# Patient Record
Sex: Male | Born: 1970 | Race: White | Hispanic: No | Marital: Single | Smoking: Current every day smoker
Health system: Southern US, Community
[De-identification: ages and names within clinical notes are randomized; demographics above are authoritative.]

## PROBLEM LIST (undated history)

## (undated) DIAGNOSIS — F1910 Other psychoactive substance abuse, uncomplicated: Secondary | ICD-10-CM

## (undated) HISTORY — PX: OTHER SURGICAL HISTORY: SHX169

## (undated) HISTORY — PX: KNEE ARTHROSCOPY: SUR90

---

## 1997-09-02 ENCOUNTER — Emergency Department (HOSPITAL_COMMUNITY): Admission: EM | Admit: 1997-09-02 | Discharge: 1997-09-02 | Payer: Self-pay | Admitting: Emergency Medicine

## 2014-06-21 ENCOUNTER — Encounter (HOSPITAL_COMMUNITY): Payer: Self-pay | Admitting: *Deleted

## 2014-06-21 ENCOUNTER — Emergency Department (HOSPITAL_COMMUNITY)
Admission: EM | Admit: 2014-06-21 | Discharge: 2014-06-22 | Disposition: A | Discharge status: Home / Self Care | Payer: Medicaid Other | Attending: Emergency Medicine | Admitting: Emergency Medicine

## 2014-06-21 DIAGNOSIS — Y92410 Unspecified street and highway as the place of occurrence of the external cause: Secondary | ICD-10-CM | POA: Insufficient documentation

## 2014-06-21 DIAGNOSIS — Y998 Other external cause status: Secondary | ICD-10-CM | POA: Insufficient documentation

## 2014-06-21 DIAGNOSIS — Y9389 Activity, other specified: Secondary | ICD-10-CM | POA: Insufficient documentation

## 2014-06-21 DIAGNOSIS — T148 Other injury of unspecified body region: Secondary | ICD-10-CM | POA: Insufficient documentation

## 2014-06-21 DIAGNOSIS — Z720 Tobacco use: Secondary | ICD-10-CM | POA: Insufficient documentation

## 2014-06-21 DIAGNOSIS — T148XXA Other injury of unspecified body region, initial encounter: Secondary | ICD-10-CM

## 2014-06-21 DIAGNOSIS — S3992XA Unspecified injury of lower back, initial encounter: Secondary | ICD-10-CM | POA: Diagnosis present
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
## 2014-06-22 MED ORDER — CYCLOBENZAPRINE HCL 10 MG PO TABS: 10.0000 mg | ORAL_TABLET | Freq: Two times a day (BID) | ORAL | Status: DC | PRN

## 2014-06-22 MED ORDER — IBUPROFEN 800 MG PO TABS: 800.0000 mg | ORAL_TABLET | Freq: Three times a day (TID) | ORAL | Status: DC
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
## 2014-09-08 ENCOUNTER — Emergency Department (HOSPITAL_COMMUNITY)
Admission: EM | Admit: 2014-09-08 | Discharge: 2014-09-08 | Disposition: A | Discharge status: Home / Self Care | Payer: Medicaid Other | Attending: Emergency Medicine | Admitting: Emergency Medicine

## 2014-09-08 ENCOUNTER — Encounter (HOSPITAL_COMMUNITY): Payer: Self-pay | Admitting: *Deleted

## 2014-09-08 DIAGNOSIS — L02414 Cutaneous abscess of left upper limb: Secondary | ICD-10-CM | POA: Diagnosis not present

## 2014-09-08 DIAGNOSIS — L0291 Cutaneous abscess, unspecified: Secondary | ICD-10-CM

## 2014-09-08 DIAGNOSIS — L02413 Cutaneous abscess of right upper limb: Secondary | ICD-10-CM | POA: Diagnosis present

## 2014-09-08 DIAGNOSIS — Z791 Long term (current) use of non-steroidal anti-inflammatories (NSAID): Secondary | ICD-10-CM | POA: Insufficient documentation

## 2014-09-08 DIAGNOSIS — Z720 Tobacco use: Secondary | ICD-10-CM | POA: Insufficient documentation

## 2014-09-08 MED ORDER — CLINDAMYCIN HCL 300 MG PO CAPS: 300.0000 mg | ORAL_CAPSULE | Freq: Four times a day (QID) | ORAL | Status: DC

## 2014-09-08 MED ORDER — LIDOCAINE HCL 2 % IJ SOLN
10.0000 mL | Freq: Once | INTRAMUSCULAR | Status: AC
  Administered 2014-09-08: 200 mg
  Filled 2014-09-08: qty 20

## 2014-09-08 NOTE — ED Provider Notes (Signed)
CSN: [id number]     Arrival date & time 09/08/14  1318 History   This chart was scribed for non-physician practitioner Teressa Lower, PA-C working with Rolland Porter, MD by Murriel Hopper, ED Scribe. This patient was seen in room WTR7/WTR7 and the patient's care was started at 1:35 PM.  Chief Complaint  Patient presents with  . Abscess      The history is provided by the patient. No language interpreter was used.     HPI Comments: Johnathan [name] is a 44 y.o. male with a hx of IV drug use who presents to the Emergency Department complaining of multiple abscesses on the lower part of his right upper arm and on his left wrist that have been present for 2 days. Pt states he has consistently used IV drugs before, and states he has been using methadone given to him by a rehabilitation clinic recently, which has caused his abscesses. Pt denies drainage from the abscesses, fever, vomiting, or chills.      History reviewed. No pertinent past medical history. History reviewed. No pertinent past surgical history. No family history on file. Social History  Substance Use Topics  . Smoking status: Current Every Day Smoker -- 0.50 packs/day    Types: Cigarettes  . Smokeless tobacco: None  . Alcohol Use: No    Review of Systems  Constitutional: Negative for fever and chills.  Gastrointestinal: Negative for vomiting.  Skin: Positive for color change.  All other systems reviewed and are negative.     Allergies  Review of patient's allergies indicates no known allergies.  Home Medications   Prior to Admission medications   Medication Sig Start Date End Date Taking? Authorizing Provider  cyclobenzaprine (FLEXERIL) 10 MG tablet Take 1 tablet (10 mg total) by mouth 2 (two) times daily as needed for muscle spasms. 06/22/14   Elpidio Anis, PA-C  ibuprofen (ADVIL,MOTRIN) 800 MG tablet Take 1 tablet (800 mg total) by mouth 3 (three) times daily. 06/22/14   Shari Upstill, PA-C   BP 104/65 mmHg   Pulse 89  Temp(Src) 99.6 F (37.6 C) (Oral)  Resp 18  SpO2 97% Physical Exam  Constitutional: He is oriented to person, place, and time.  Cardiovascular: Normal rate and regular rhythm.   Pulmonary/Chest: Effort normal and breath sounds normal.  Neurological: He is alert and oriented to person, place, and time.  Skin:  Swelling and redness to the right ac. Tender to palpation to the left forearm without fluctuance or redness  Nursing note and vitals reviewed.   ED Course  INCISION AND DRAINAGE Date/Time: 09/08/2014 1:58 PM Performed by: Teressa Lower Authorized by: Teressa Lower Consent: Verbal consent obtained. Consent given by: patient Patient identity confirmed: verbally with patient Time out: Immediately prior to procedure a "time out" was called to verify the correct patient, procedure, equipment, support staff and site/side marked as required. Type: abscess Body area: upper extremity Location details: right elbow Anesthesia: local infiltration Local anesthetic: lidocaine 2% without epinephrine Scalpel size: 11 Incision type: single straight Complexity: simple Drainage: purulent Drainage amount: scant Wound treatment: wound left open Packing material: 1/2 in iodoform gauze   (including critical care time)  DIAGNOSTIC STUDIES: Oxygen Saturation is 97% on room air, normal by my interpretation.    COORDINATION OF CARE: 1:40 PM Discussed treatment plan with pt at bedside and pt agreed to plan.   Labs Review Labs Reviewed - No data to display  Imaging Review No results found. I have personally reviewed and evaluated these  images and lab results as part of my medical decision-making.   EKG Interpretation None      MDM   Final diagnoses:  Abscess   Pt given clindamycin. Dr. Fayrene Fearing I&D the abscess on the forearm  I personally performed the services described in this documentation, which was scribed in my presence. The recorded information has been  reviewed and is accurate.    Teressa Lower, NP 09/08/14 1424  Rolland Porter, MD 09/11/14 [phone]

## 2014-09-08 NOTE — ED Notes (Signed)
Pt complains of abscesses to his left forearm and right upper arm for the past 2 days. Pt denies drainage to abscesses.

## 2014-09-08 NOTE — Discharge Instructions (Signed)
Have the parking removed in the last couple of days Abscess An abscess is an infected area that contains a collection of pus and debris.It can occur in almost any part of the body. An abscess is also known as a furuncle or boil. CAUSES  An abscess occurs when tissue gets infected. This can occur from blockage of oil or sweat glands, infection of hair follicles, or a minor injury to the skin. As the body tries to fight the infection, pus collects in the area and creates pressure under the skin. This pressure causes pain. People with weakened immune systems have difficulty fighting infections and get certain abscesses more often.  SYMPTOMS Usually an abscess develops on the skin and becomes a painful mass that is red, warm, and tender. If the abscess forms under the skin, you may feel a moveable soft area under the skin. Some abscesses break open (rupture) on their own, but most will continue to get worse without care. The infection can spread deeper into the body and eventually into the bloodstream, causing you to feel ill.  DIAGNOSIS  Your caregiver will take your medical history and perform a physical exam. A sample of fluid may also be taken from the abscess to determine what is causing your infection. TREATMENT  Your caregiver may prescribe antibiotic medicines to fight the infection. However, taking antibiotics alone usually does not cure an abscess. Your caregiver may need to make a small cut (incision) in the abscess to drain the pus. In some cases, gauze is packed into the abscess to reduce pain and to continue draining the area. HOME CARE INSTRUCTIONS   Only take over-the-counter or prescription medicines for pain, discomfort, or fever as directed by your caregiver.  If you were prescribed antibiotics, take them as directed. Finish them even if you start to feel better.  If gauze is used, follow your caregiver's directions for changing the gauze.  To avoid spreading the infection:  Keep  your draining abscess covered with a bandage.  Wash your hands well.  Do not share personal care items, towels, or whirlpools with others.  Avoid skin contact with others.  Keep your skin and clothes clean around the abscess.  Keep all follow-up appointments as directed by your caregiver. SEEK MEDICAL CARE IF:   You have increased pain, swelling, redness, fluid drainage, or bleeding.  You have muscle aches, chills, or a general ill feeling.  You have a fever. MAKE SURE YOU:   Understand these instructions.  Will watch your condition.  Will get help right away if you are not doing well or get worse. Document Released: 10/03/2004 Document Revised: 06/25/2011 Document Reviewed: 03/08/2011 [facility] Patient Information 2015 [place], [place]. This information is not intended to replace advice given to you by your health care provider. Make sure you discuss any questions you have with your health care provider.

## 2014-09-12 ENCOUNTER — Inpatient Hospital Stay (HOSPITAL_COMMUNITY)
Admission: EM | Admit: 2014-09-12 | Discharge: 2014-09-16 | DRG: 603 | Disposition: A | Discharge status: Home / Self Care | Payer: Medicaid Other | Attending: Internal Medicine | Admitting: Internal Medicine

## 2014-09-12 ENCOUNTER — Inpatient Hospital Stay (HOSPITAL_COMMUNITY): Payer: Medicaid Other

## 2014-09-12 ENCOUNTER — Encounter (HOSPITAL_COMMUNITY): Payer: Self-pay | Admitting: *Deleted

## 2014-09-12 DIAGNOSIS — L03114 Cellulitis of left upper limb: Secondary | ICD-10-CM | POA: Diagnosis present

## 2014-09-12 DIAGNOSIS — B182 Chronic viral hepatitis C: Secondary | ICD-10-CM | POA: Diagnosis present

## 2014-09-12 DIAGNOSIS — L0291 Cutaneous abscess, unspecified: Secondary | ICD-10-CM

## 2014-09-12 DIAGNOSIS — F17210 Nicotine dependence, cigarettes, uncomplicated: Secondary | ICD-10-CM | POA: Diagnosis present

## 2014-09-12 DIAGNOSIS — R197 Diarrhea, unspecified: Secondary | ICD-10-CM | POA: Diagnosis present

## 2014-09-12 DIAGNOSIS — R61 Generalized hyperhidrosis: Secondary | ICD-10-CM | POA: Diagnosis present

## 2014-09-12 DIAGNOSIS — F1910 Other psychoactive substance abuse, uncomplicated: Secondary | ICD-10-CM | POA: Diagnosis present

## 2014-09-12 DIAGNOSIS — Z2252 Carrier of viral hepatitis C: Secondary | ICD-10-CM | POA: Diagnosis not present

## 2014-09-12 DIAGNOSIS — L02414 Cutaneous abscess of left upper limb: Secondary | ICD-10-CM | POA: Diagnosis present

## 2014-09-12 DIAGNOSIS — L02413 Cutaneous abscess of right upper limb: Secondary | ICD-10-CM | POA: Diagnosis present

## 2014-09-12 DIAGNOSIS — B1920 Unspecified viral hepatitis C without hepatic coma: Secondary | ICD-10-CM | POA: Diagnosis present

## 2014-09-12 DIAGNOSIS — Z720 Tobacco use: Secondary | ICD-10-CM | POA: Diagnosis not present

## 2014-09-12 DIAGNOSIS — E872 Acidosis: Secondary | ICD-10-CM | POA: Diagnosis present

## 2014-09-12 DIAGNOSIS — L03113 Cellulitis of right upper limb: Secondary | ICD-10-CM | POA: Diagnosis present

## 2014-09-12 DIAGNOSIS — B9689 Other specified bacterial agents as the cause of diseases classified elsewhere: Secondary | ICD-10-CM | POA: Diagnosis not present

## 2014-09-12 DIAGNOSIS — K089 Disorder of teeth and supporting structures, unspecified: Secondary | ICD-10-CM | POA: Diagnosis present

## 2014-09-12 DIAGNOSIS — R894 Abnormal immunological findings in specimens from other organs, systems and tissues: Secondary | ICD-10-CM | POA: Diagnosis not present

## 2014-09-12 HISTORY — DX: Other psychoactive substance abuse, uncomplicated: F19.10

## 2014-09-12 LAB — CBC
HCT: 41.8 % (ref 39.0–52.0)
HEMOGLOBIN: 14.1 g/dL (ref 13.0–17.0)
MCH: 29.3 pg (ref 26.0–34.0)
MCHC: 33.7 g/dL (ref 30.0–36.0)
MCV: 86.9 fL (ref 78.0–100.0)
Platelets: 351 10*3/uL (ref 150–400)
RBC: 4.81 MIL/uL (ref 4.22–5.81)
RDW: 14 % (ref 11.5–15.5)
WBC: 11.5 10*3/uL — ABNORMAL HIGH (ref 4.0–10.5)

## 2014-09-12 LAB — BASIC METABOLIC PANEL
Anion gap: 7 (ref 5–15)
BUN: 5 mg/dL — AB (ref 6–20)
CO2: 30 mmol/L (ref 22–32)
CREATININE: 0.74 mg/dL (ref 0.61–1.24)
Calcium: 8.8 mg/dL — ABNORMAL LOW (ref 8.9–10.3)
Chloride: 99 mmol/L — ABNORMAL LOW (ref 101–111)
GFR calc Af Amer: >60 mL/min (ref >60)
Glucose, Bld: 99 mg/dL (ref 65–99)
Potassium: 3.7 mmol/L (ref 3.5–5.1)
SODIUM: 136 mmol/L (ref 135–145)

## 2014-09-12 LAB — I-STAT CG4 LACTIC ACID, ED: LACTIC ACID, VENOUS: 3.03 mmol/L — AB (ref 0.5–2.0)

## 2014-09-12 LAB — LACTIC ACID, PLASMA: LACTIC ACID, VENOUS: 1 mmol/L (ref 0.5–2.0)

## 2014-09-12 MED ORDER — MORPHINE SULFATE (PF) 2 MG/ML IV SOLN
1.0000 mg | INTRAVENOUS | Status: DC | PRN
  Administered 2014-09-12 – 2014-09-16 (×23): 1 mg via INTRAVENOUS
  Filled 2014-09-12 (×22): qty 1

## 2014-09-12 MED ORDER — VANCOMYCIN HCL IN DEXTROSE 1-5 GM/200ML-% IV SOLN
1000.0000 mg | Freq: Once | INTRAVENOUS | Status: AC
  Administered 2014-09-12: 1000 mg via INTRAVENOUS
  Filled 2014-09-12: qty 200

## 2014-09-12 MED ORDER — VANCOMYCIN HCL IN DEXTROSE 1-5 GM/200ML-% IV SOLN
1000.0000 mg | Freq: Three times a day (TID) | INTRAVENOUS | Status: DC
  Administered 2014-09-12 – 2014-09-15 (×8): 1000 mg via INTRAVENOUS
  Filled 2014-09-12 (×10): qty 200

## 2014-09-12 MED ORDER — ACETAMINOPHEN 650 MG RE SUPP: 650.0000 mg | Freq: Four times a day (QID) | RECTAL | Status: DC | PRN

## 2014-09-12 MED ORDER — OXYCODONE HCL 5 MG PO TABS
5.0000 mg | ORAL_TABLET | ORAL | Status: DC | PRN
  Filled 2014-09-12: qty 1

## 2014-09-12 MED ORDER — SODIUM CHLORIDE 0.9 % IV BOLUS (SEPSIS)
500.0000 mL | INTRAVENOUS | Status: AC
  Administered 2014-09-12: 500 mL via INTRAVENOUS

## 2014-09-12 MED ORDER — METHADONE HCL 10 MG PO TABS
70.0000 mg | ORAL_TABLET | Freq: Every day | ORAL | Status: DC
  Administered 2014-09-13 – 2014-09-16 (×4): 70 mg via ORAL
  Filled 2014-09-12 (×4): qty 7

## 2014-09-12 MED ORDER — ACETAMINOPHEN 325 MG PO TABS
650.0000 mg | ORAL_TABLET | Freq: Four times a day (QID) | ORAL | Status: DC | PRN
  Administered 2014-09-14: 650 mg via ORAL
  Filled 2014-09-12: qty 2

## 2014-09-12 MED ORDER — ENOXAPARIN SODIUM 40 MG/0.4ML ~~LOC~~ SOLN
40.0000 mg | SUBCUTANEOUS | Status: DC
  Administered 2014-09-12 – 2014-09-15 (×4): 40 mg via SUBCUTANEOUS
  Filled 2014-09-12 (×4): qty 0.4

## 2014-09-12 MED ORDER — SODIUM CHLORIDE 0.9 % IV SOLN
INTRAVENOUS | Status: DC
  Administered 2014-09-13 – 2014-09-14 (×2): via INTRAVENOUS

## 2014-09-12 MED ORDER — SODIUM CHLORIDE 0.9 % IV SOLN
Freq: Once | INTRAVENOUS | Status: AC
  Administered 2014-09-12: 18:00:00 via INTRAVENOUS

## 2014-09-12 MED ORDER — SODIUM CHLORIDE 0.9 % IV BOLUS (SEPSIS)
1000.0000 mL | INTRAVENOUS | Status: AC
  Administered 2014-09-12: 1000 mL via INTRAVENOUS

## 2014-09-12 NOTE — Progress Notes (Signed)
Patient listed as having Medicaid [employer] insurance.  PCP listed on patient's insurance card is located at [facility].

## 2014-09-12 NOTE — ED Notes (Addendum)
Pt seen in ED on 9/1 for 2 abscess, 1 in R AC area, 1 to left forearm. Both were size of quarter and lanced, given prescription for clindamycin, has been complaint with medications. right AC abscess has improved. Left forearm abscess redness/swelling now has spread to all of forearm. Pain 6/10. Both abscess has been draining. Has been taking aleve for pain.

## 2014-09-12 NOTE — H&P (Signed)
[facility] History and Physical  Cy Bresee OZH:[id number] DOB: 1970/09/20 DOA: 09/12/2014  Referring physician: EDP PCP: No primary care provider on file.   Chief Complaint: worsening cellulitis of the left forearm.   HPI: Johnathan [name] is a 44 y.o. male with prior h/o of IV drug abuse, last use two weeks ago presented 5 days ago with cellulitis of the right arm and cellulitis and abscess of the left forearm, under went I&D of the left forearm and was discharged on oral clindamycin. He returns with worsening pain , erythema and swelling and failed outpatient treatment with clindamycin. He reports fevers and night sweats and loose bm since then.  He was referred to medical service for further evaluation.    Review of Systems:  Constitutional:  fevers HEENT:  No headaches, Difficulty swallowing,Tooth/dental problems,Sore throat,  No sneezing, itching, ear ache, nasal congestion, post nasal drip,  Cardio-vascular:  No chest pain, Orthopnea, PND, swelling in lower extremities, anasarca, dizziness, palpitations  GI:  No heartburn, indigestion, abdominal pain, nausea, vomiting, diarrhea, change in bowel habits, loss of appetite  Resp:  No shortness of breath with exertion or at rest. No excess mucus, no productive cough, No non-productive cough, No coughing up of blood.No change in color of mucus.No wheezing.No chest wall deformity  Skin:  no rash or lesions.  GU:  no dysuria, change in color of urine, no urgency or frequency. No flank pain.  Musculoskeletal:  Has been using IV drugs , last use 2 weeks ago, and since then cellulitis and abscess of the right forearm and left forearm.  Psych:  No change in mood or affect. No depression or anxiety. No memory loss.   History reviewed. No pertinent past medical history. History reviewed. No pertinent past surgical history. Social History:  reports that he has been smoking Cigarettes.  He has been smoking about 0.50  packs per day. He does not have any smokeless tobacco history on file. He reports that he does not drink alcohol or use illicit drugs.  No Known Allergies  History reviewed. No pertinent family history.  Denies any family history.   Prior to Admission medications   Medication Sig Start Date End Date Taking? Authorizing Provider  clindamycin (CLEOCIN) 300 MG capsule Take 1 capsule (300 mg total) by mouth every 6 (six) hours. 09/08/14  Yes Teressa Lower, NP  naproxen sodium (ANAPROX) 220 MG tablet Take 440 mg by mouth every 12 (twelve) hours as needed (pain).   Yes Historical Provider, MD  cyclobenzaprine (FLEXERIL) 10 MG tablet Take 1 tablet (10 mg total) by mouth 2 (two) times daily as needed for muscle spasms. Patient not taking: Reported on 09/12/2014 06/22/14   Elpidio Anis, PA-C  ibuprofen (ADVIL,MOTRIN) 800 MG tablet Take 1 tablet (800 mg total) by mouth 3 (three) times daily. Patient not taking: Reported on 09/12/2014 06/22/14   Elpidio Anis, PA-C   Physical Exam: Filed Vitals:   09/12/14 1745 09/12/14 1800 09/12/14 1815 09/12/14 1850  BP: 118/70 110/73 103/68 123/73  Pulse: 69 79 72 68  Temp:    98.7 F (37.1 C)  TempSrc:    Oral  Resp: 11 13 10 16   Height:    5\' 11"  (1.803 m)  Weight:    75.297 kg (166 lb)  SpO2: 95% 97% 100% 100%    Wt Readings from Last 3 Encounters:  09/12/14 75.297 kg (166 lb)    General:  Appears calm and comfortable Eyes: PERRL, normal lids, irises & conjunctiva Neck: left axillary  adenopathy, no masses or thyromegaly Cardiovascular: RRR, no m/r/g. No LE edema. Telemetry: SR, no arrhythmias  Respiratory: CTA bilaterally, no w/r/r. Normal respiratory effort. Abdomen: soft, ntnd Skin: no rash or induration seen on limited exam Musculoskeletal: LEFT forearm, tender, swollen and erythematous.  Psychiatric: grossly normal mood and affect, speech fluent and appropriate Neurologic: grossly non-focal.          Labs on Admission:  Basic Metabolic  Panel:  Recent Labs Lab 09/12/14 1405  NA 136  K 3.7  CL 99*  CO2 30  GLUCOSE 99  BUN 5*  CREATININE 0.74  CALCIUM 8.8*   Liver Function Tests: No results for input(s): AST, ALT, ALKPHOS, BILITOT, PROT, ALBUMIN in the last 168 hours. No results for input(s): LIPASE, AMYLASE in the last 168 hours. No results for input(s): AMMONIA in the last 168 hours. CBC:  Recent Labs Lab 09/12/14 1405  WBC 11.5*  HGB 14.1  HCT 41.8  MCV 86.9  PLT 351   Cardiac Enzymes: No results for input(s): CKTOTAL, CKMB, CKMBINDEX, TROPONINI in the last 168 hours.  BNP (last 3 results) No results for input(s): BNP in the last 8760 hours.  ProBNP (last 3 results) No results for input(s): PROBNP in the last 8760 hours.  CBG: No results for input(s): GLUCAP in the last 168 hours.  Radiological Exams on Admission: No results found.  EKG: pending.   Assessment/Plan Active Problems:   Cellulitis   Worsening cellulitis: failed outpatient treatment with oral clindamycin.  - started on IV vancomycin.  - unfortunately no cultures available from the I&D done on 1st of sep.  - get ct of the forearm.  - pain control and hydration.    Elevated lactic acid: Possibly from the above.  Repeat lactic acid in 4 hours .    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  Resume the same from tomorrow.  Check hep panel and HIV.  He is agreeable.    Diarrhea since two days: check for C DIFF.     Code Status: full code  DVT Prophylaxis: Family Communication: none at bedside Disposition Plan: admit to med surg.   Time spent: 55 minutes.   [facility] Pager [phone]

## 2014-09-12 NOTE — ED Provider Notes (Signed)
CSN: [id number]     Arrival date & time 09/12/14  1336 History   First MD Initiated Contact with Patient 09/12/14 1625     Chief Complaint  Patient presents with  . Arm Infection      (Consider location/radiation/quality/duration/timing/severity/associated sxs/prior Treatment) The history is provided by the patient.    Past Medical History  Diagnosis Date  . Substance abuse    Past Surgical History  Procedure Laterality Date  . Incision and drainage of bilateral forearm abscesses Bilateral    History reviewed. No pertinent family history. Social History  Substance Use Topics  . Smoking status: Current Every Day Smoker -- 0.50 packs/day for 15 years    Types: Cigarettes  . Smokeless tobacco: None  . Alcohol Use: No     Comment: seldom once a year     Review of Systems  Constitutional: Positive for chills. Negative for fever.  Respiratory: Negative for shortness of breath.   Cardiovascular: Negative for chest pain, palpitations and leg swelling.  Gastrointestinal: Negative for nausea and vomiting.  Musculoskeletal: Negative for joint swelling and neck pain.  Skin: Positive for wound.  Neurological: Negative for weakness and numbness.  All other systems reviewed and are negative.     Allergies  Review of patient's allergies indicates no known allergies.  Home Medications   Prior to Admission medications   Medication Sig Start Date End Date Taking? Authorizing Provider  clindamycin (CLEOCIN) 300 MG capsule Take 1 capsule (300 mg total) by mouth every 6 (six) hours. 09/08/14  Yes Teressa Lower, NP  methadone (DOLOPHINE) 10 MG tablet Take 70 mg by mouth daily. Receives from [facility] in [place]   Yes Historical Provider, MD  naproxen sodium (ANAPROX) 220 MG tablet Take 440 mg by mouth every 12 (twelve) hours as needed (pain).   Yes Historical Provider, MD  cyclobenzaprine (FLEXERIL) 10 MG tablet Take 1 tablet (10 mg total) by mouth 2 (two) times  daily as needed for muscle spasms. Patient not taking: Reported on 09/12/2014 06/22/14   Elpidio Anis, PA-C  ibuprofen (ADVIL,MOTRIN) 800 MG tablet Take 1 tablet (800 mg total) by mouth 3 (three) times daily. Patient not taking: Reported on 09/12/2014 06/22/14   Elpidio Anis, PA-C   BP 113/78 mmHg  Pulse 56  Temp(Src) 98.2 F (36.8 C) (Oral)  Resp 16  Ht  (1.803 m)  Wt 166 lb (75.297 kg)  BMI 23.16 kg/m2  SpO2 96% Physical Exam  Constitutional: He is oriented to person, place, and time. He appears well-developed and well-nourished.  HENT:  Head: Normocephalic.  Eyes: Pupils are equal, round, and reactive to light.  Neck: Normal range of motion.  Cardiovascular: Normal rate and regular rhythm.   Pulmonary/Chest: Effort normal and breath sounds normal.  Abdominal: Soft. Bowel sounds are normal.  Musculoskeletal: He exhibits edema and tenderness.  Lymphadenopathy:    He has axillary adenopathy.       Left axillary: Pectoral adenopathy present.  Neurological: He is alert and oriented to person, place, and time.  Skin: There is erythema.  Area has been outlined   Nursing note and vitals reviewed.   ED Course  Procedures (including critical care time) Labs Review Labs Reviewed  CBC - Abnormal; Notable for the following:    WBC 11.5 (*)    All other components within normal limits  BASIC METABOLIC PANEL - Abnormal; Notable for the following:    Chloride 99 (*)    BUN 5 (*)    Calcium  8.8 (*)    All other components within normal limits  HEPATITIS PANEL, ACUTE - Abnormal; Notable for the following:    HCV Ab >11.0 (*)    All other components within normal limits  CBC - Abnormal; Notable for the following:    Hemoglobin 12.5 (*)    HCT 38.5 (*)    All other components within normal limits  VANCOMYCIN, TROUGH - Abnormal; Notable for the following:    Vancomycin Tr 25 (*)    All other components within normal limits  I-STAT CG4 LACTIC ACID, ED - Abnormal; Notable for the  following:    Lactic Acid, Venous 3.03 (*)    All other components within normal limits  CULTURE, BLOOD (ROUTINE X 2)  CULTURE, BLOOD (ROUTINE X 2)  LACTIC ACID, PLASMA  HIV ANTIBODY (ROUTINE TESTING)  CREATININE, SERUM  HEPATITIS C VRS RNA DETECT BY PCR-QUAL  HEPATITIS B SURFACE ANTIGEN  HEPATITIS B SURFACE ANTIBODY, QUANTITATIVE  I-STAT CG4 LACTIC ACID, ED    Imaging Review No results found. I have personally reviewed and evaluated these images and lab results as part of my medical decision-making.   EKG Interpretation None    worsening cellulitis despite PO Clindamycin use  Still taking  A should meet sepsis criteria chills, temperature, elevated white count and elevated lactic acid MDM   Final diagnoses:  Cellulitis of left upper extremity        Earley Favor, NP 09/15/14 1954  Lyndal Pulley, MD 09/15/14 2121

## 2014-09-12 NOTE — ED Notes (Addendum)
Pt comes in today for left arm pain. Pt states he was seen 5 days ago and had the area lanced off. Pt states the pain has worsened and the swelling has significantly increased. Pt noted to have the lower portion of his left arm anterior side swollen and very painful to touch or movement.

## 2014-09-12 NOTE — Progress Notes (Addendum)
ANTIBIOTIC CONSULT NOTE - INITIAL  Pharmacy Consult for vancomycin Indication: cellulitis  No Known Allergies  Patient Measurements: Height:  (180.3 cm) Weight: 166 lb (75.297 kg) IBW/kg (Calculated) : 75.3  Vital Signs: Temp: 98.7 F (37.1 C) (09/05 1850) Temp Source: Oral (09/05 1850) BP: 123/73 mmHg (09/05 1850) Pulse Rate: 68 (09/05 1850) Intake/Output from previous day:   Intake/Output from this shift:    Labs:  Recent Labs  09/12/14 1405  WBC 11.5*  HGB 14.1  PLT 351  CREATININE 0.74   Estimated Creatinine Clearance: 126.8 mL/min (by C-G formula based on Cr of 0.74). No results for input(s): VANCOTROUGH, VANCOPEAK, VANCORANDOM, GENTTROUGH, GENTPEAK, GENTRANDOM, TOBRATROUGH, TOBRAPEAK, TOBRARND, AMIKACINPEAK, AMIKACINTROU, AMIKACIN in the last 72 hours.   Microbiology: No results found for this or any previous visit (from the past 720 hour(s)).  Medical History: History reviewed. No pertinent past medical history.  Medications:  Anti-infectives    Start     Dose/Rate Route Frequency Ordered Stop   09/12/14 1645  vancomycin (VANCOCIN) IVPB 1000 mg/200 mL premix     1,000 mg 200 mL/hr over 60 Minutes Intravenous  Once 09/12/14 1635 09/12/14 1837     Assessment: 43 yoM with Hx IVDU, last use two weeks ago presented 5 days ago with cellulitis of the right arm and cellulitis and abscess of the left forearm, under went I&D of the left forearm and was discharged on oral clindamycin. He returns with worsening pain, erythema and swelling and failed outpatient treatment with clindamycin.  Pharmacy to begin vancomycin for purulent cellulitis.  Note code sepsis called and 1 g vancomycin given in ED.  9/5 >> vancomycin >>  Temp: afebrile WBC: mildly elevated Renal: SCr wnl; CrCl > 90 CG LA elevated  9/5 blood: IP 9/5 Cdiff: IP   Goal of Therapy:  Vancomycin trough level 15-20 mcg/ml Eradication of infection Appropriate antibiotic dosing for indication  and renal function  Plan:  Day 1 antibiotics  Vancomycin 1 g IV q8 hr.   Targeting higher trough d/t worsening cellulitis despite appropriate antibiotics, noted abscess, and lactic acidosis.  May consider reducing target trough once cellulitis appears to be improving.  Measure vancomycin trough levels soon with q8h dosing.  Given location of infection and recent Hx IV drug abuse, would consider expanding coverage to cover gram negatives and anaerobes if patient does not improve overnight.  Follow clinical course, renal function, culture results as available  Follow for de-escalation of antibiotics and LOT   Bernadene Person, PharmD, BCPS Pager: [phone] 09/12/2014, 8:20 PM

## 2014-09-12 NOTE — ED Notes (Signed)
Critical Istat Lactic result given to PA Dondra Spry

## 2014-09-12 NOTE — ED Notes (Signed)
Went to draw pts lab however pt not in bed. RN is aware

## 2014-09-12 NOTE — Progress Notes (Signed)
Utilization Review completed.  Armani Gawlik RN CM  

## 2014-09-13 ENCOUNTER — Encounter (HOSPITAL_COMMUNITY): Payer: Self-pay | Admitting: *Deleted

## 2014-09-13 LAB — HIV ANTIBODY (ROUTINE TESTING W REFLEX): HIV Screen 4th Generation wRfx: NONREACTIVE

## 2014-09-13 MED ORDER — OXYCODONE HCL 5 MG PO TABS
10.0000 mg | ORAL_TABLET | ORAL | Status: DC | PRN
  Administered 2014-09-14 – 2014-09-16 (×8): 10 mg via ORAL
  Filled 2014-09-13 (×8): qty 2

## 2014-09-13 NOTE — Progress Notes (Signed)
Patient said his MOM can get updates about his condition.

## 2014-09-13 NOTE — Plan of Care (Signed)
Problem: Phase I Progression Outcomes Goal: OOB as tolerated unless otherwise ordered Outcome: Completed/Met Date Met:  09/13/14 Up as tol

## 2014-09-13 NOTE — Progress Notes (Signed)
[facility] PROGRESS NOTE  Toddrick Sanna ZOX:[id number] DOB: 09/12/1970 DOA: 09/12/2014 PCP: No primary care provider on file. Interim summary: Johnathan [name] is a 44 y.o. male with prior h/o of IV drug abuse, last use two weeks ago presented 5 days ago with cellulitis of the right arm and cellulitis and abscess of the left forearm, under went I&D of the left forearm and was discharged on oral clindamycin. He returns with worsening pain , erythema and swelling and failed outpatient treatment with clindamycin. Assessment/Plan: Worsening cellulitis: failed outpatient treatment with oral clindamycin.  - started on IV vancomycin.  - unfortunately no cultures available from the I&D done on 1st of sep.  - CT of the forearm does not show any abscess. Blood cultures done and hae been negative so far.  - pain control and hydration.    Elevated lactic acid: Possibly from the above.  Repeat lactic acid in 4 hours  Is normal.    IV drug abuse: on 70 mg of methadone.  Check hep panel and HIV.  He is agreeable.    Diarrhea since two days: resolved.    Code Status: full code.  Family Communication:none at bedside Disposition Plan: pending further management.    Consultants:  none  Procedures:  Ct forearm  Antibiotics:  Vancomycin9/5  HPI/Subjective: Reports pain is persistent.  Objective: Filed Vitals:   09/13/14 1335  BP: 101/68  Pulse: 68  Temp: 97.7 F (36.5 C)  Resp: 16    Intake/Output Summary (Last 24 hours) at 09/13/14 1753 Last data filed at 09/13/14 1230  Gross per 24 hour  Intake   4462 ml  Output      0 ml  Net   4462 ml   Filed Weights   09/12/14 1740 09/12/14 1850  Weight: 75.297 kg (166 lb) 75.297 kg (166 lb)    Exam:   General:  Alert in pain from the arm  Cardiovascular: s1s2, no MRG,   Respiratory: clear no wheezing or rhonchi  Abdomen: soft non tender non distended bowel sounds heard  Musculoskeletal: LEFT  forearm, tender, swollen and erythematous. appear to be slightly worsened from yesterday.   Data Reviewed: Basic Metabolic Panel:  Recent Labs Lab 09/12/14 1405  NA 136  K 3.7  CL 99*  CO2 30  GLUCOSE 99  BUN 5*  CREATININE 0.74  CALCIUM 8.8*   Liver Function Tests: No results for input(s): AST, ALT, ALKPHOS, BILITOT, PROT, ALBUMIN in the last 168 hours. No results for input(s): LIPASE, AMYLASE in the last 168 hours. No results for input(s): AMMONIA in the last 168 hours. CBC:  Recent Labs Lab 09/12/14 1405  WBC 11.5*  HGB 14.1  HCT 41.8  MCV 86.9  PLT 351   Cardiac Enzymes: No results for input(s): CKTOTAL, CKMB, CKMBINDEX, TROPONINI in the last 168 hours. BNP (last 3 results) No results for input(s): BNP in the last 8760 hours.  ProBNP (last 3 results) No results for input(s): PROBNP in the last 8760 hours.  CBG: No results for input(s): GLUCAP in the last 168 hours.  Recent Results (from the past 240 hour(s))  Culture, blood (routine x 2)     Status: None (Preliminary result)   Collection Time: 09/12/14  5:00 PM  Result Value Ref Range Status   Specimen Description BLOOD RIGHT ANTECUBITAL  Final   Special Requests BOTTLES DRAWN AEROBIC AND ANAEROBIC  Final   Culture   Final    NO GROWTH < 24 HOURS Performed at [facility]    Report Status PENDING  Incomplete  Culture, blood (routine x 2)     Status: None (Preliminary result)   Collection Time: 09/12/14  5:33 PM  Result Value Ref Range Status   Specimen Description BLOOD RIGHT ANTECUBITAL  Final   Special Requests BOTTLES DRAWN AEROBIC AND ANAEROBIC 10CC  Final   Culture   Final    NO GROWTH < 24 HOURS Performed at [facility]    Report Status PENDING  Incomplete     Studies: Ct Forearm Left Wo Contrast  09/13/2014   CLINICAL DATA:  LEFT forearm abscess. Lateral wrist abscess from IV drug abuse.  EXAM: CT OF THE LEFT FOREARM WITHOUT CONTRAST  TECHNIQUE: Multidetector CT  imaging was performed according to the standard protocol. Multiplanar CT image reconstructions were also generated.  COMPARISON:  None.  FINDINGS: Evaluation for abscess is degraded by noncontrast technique. There is no soft tissue abscess. Diffuse subcutaneous edema is present in the forearm extending from the elbow to the wrist. This is compatible with cellulitis in the appropriate clinical setting. Radius and ulna appear normal. Distal humerus and visible carpal bones appear normal.  IMPRESSION: No soft tissue abscess. Diffuse subcutaneous edema compatible with cellulitis in the appropriate clinical setting.   Electronically Signed   By: Andreas Newport M.D.   On: 09/13/2014 07:14    Scheduled Meds: . enoxaparin (LOVENOX) injection  40 mg Subcutaneous Q24H  . methadone  70 mg Oral Daily  . vancomycin  1,000 mg Intravenous Q8H   Continuous Infusions: . sodium chloride 75 mL/hr at 09/13/14 1154    Active Problems:   Cellulitis    Time spent: 35 min    Odin Mariani  [facility] Pager [phone] If 7PM-7AM, please contact night-coverage at [email or web address], password [facility] 09/13/2014, 5:53 PM  LOS: 1 day

## 2014-09-14 ENCOUNTER — Encounter (HOSPITAL_COMMUNITY): Payer: Self-pay | Admitting: Internal Medicine

## 2014-09-14 DIAGNOSIS — R509 Fever, unspecified: Secondary | ICD-10-CM

## 2014-09-14 DIAGNOSIS — L02413 Cutaneous abscess of right upper limb: Secondary | ICD-10-CM | POA: Diagnosis present

## 2014-09-14 DIAGNOSIS — Z720 Tobacco use: Secondary | ICD-10-CM

## 2014-09-14 DIAGNOSIS — F1910 Other psychoactive substance abuse, uncomplicated: Secondary | ICD-10-CM | POA: Diagnosis present

## 2014-09-14 DIAGNOSIS — B182 Chronic viral hepatitis C: Secondary | ICD-10-CM | POA: Diagnosis present

## 2014-09-14 DIAGNOSIS — B9689 Other specified bacterial agents as the cause of diseases classified elsewhere: Secondary | ICD-10-CM

## 2014-09-14 DIAGNOSIS — F17210 Nicotine dependence, cigarettes, uncomplicated: Secondary | ICD-10-CM | POA: Diagnosis present

## 2014-09-14 DIAGNOSIS — R894 Abnormal immunological findings in specimens from other organs, systems and tissues: Secondary | ICD-10-CM

## 2014-09-14 DIAGNOSIS — L02414 Cutaneous abscess of left upper limb: Secondary | ICD-10-CM

## 2014-09-14 DIAGNOSIS — R634 Abnormal weight loss: Secondary | ICD-10-CM

## 2014-09-14 DIAGNOSIS — K089 Disorder of teeth and supporting structures, unspecified: Secondary | ICD-10-CM | POA: Diagnosis present

## 2014-09-14 DIAGNOSIS — Z2252 Carrier of viral hepatitis C: Secondary | ICD-10-CM

## 2014-09-14 LAB — CBC
HEMATOCRIT: 38.5 % — AB (ref 39.0–52.0)
Hemoglobin: 12.5 g/dL — ABNORMAL LOW (ref 13.0–17.0)
MCH: 28.2 pg (ref 26.0–34.0)
MCHC: 32.5 g/dL (ref 30.0–36.0)
MCV: 86.9 fL (ref 78.0–100.0)
PLATELETS: 294 10*3/uL (ref 150–400)
RBC: 4.43 MIL/uL (ref 4.22–5.81)
RDW: 13.7 % (ref 11.5–15.5)
WBC: 8.3 10*3/uL (ref 4.0–10.5)

## 2014-09-14 LAB — CREATININE, SERUM: Creatinine, Ser: 0.74 mg/dL (ref 0.61–1.24)

## 2014-09-14 LAB — HEPATITIS PANEL, ACUTE
HCV Ab: >11 s/co ratio — ABNORMAL HIGH (ref 0.0–0.9)
HEP B C IGM: NEGATIVE
Hep A IgM: NEGATIVE
Hepatitis B Surface Ag: NEGATIVE

## 2014-09-14 MED ORDER — SODIUM CHLORIDE 0.9 % IV SOLN
1.5000 g | Freq: Four times a day (QID) | INTRAVENOUS | Status: DC
  Administered 2014-09-14 – 2014-09-16 (×7): 1.5 g via INTRAVENOUS
  Filled 2014-09-14 (×9): qty 1.5

## 2014-09-14 NOTE — Progress Notes (Addendum)
[facility] Progress Note   Johnathan [name]  OZH:[id number]  DOB: 1970/04/27  DOA: 09/12/2014 PCP: Karle Plumber, MD  Brief narrative: Johnathan [name] is a 44 y.o. male with a history of IV drug abuse who presents with increasing cellulitis of the right arm and cellulitis and abscess of left forearm despite being started on clindamycin on 9/1. States that it started off as a small knot in his forearm about 3 weeks ago when he last injected himself with oxycodone but then subsequently spread. He underwent an I and D of the left forearm abscess on 9/1-cultures not sent. After starting clindamycin, he states swelling extended further up the forearm and he therefore presented to the ER once again. Admitted and started on vancomycin. Hep C antibody found to be positive. He has been going to a methadone clinic about 4 weeks now. He last injected IV Roxicodone about 3 weeks ago.   Subjective: Continues to have swelling and pain in left forearm  Assessment/Plan: Principal Problem: Cellulitis of bilateral forearms-abscess of left forearm status post I & D -in relation to IV injection of Roxicodone last injected about 3 weeks ago -no culture sent-no significant improvement with vancomycin-I have consult infectious disease and they have added Unasyn today  Active Problems:   Hepatitis C antibody test positive -Follow up hep C viral load -Hep B serology also sent    Substance abuse -Currently in a methadone clinic for his drug abuse-continue methadone    Cigarette smoker -Advised to discontinue   Code Status:     Code Status Orders        Start     Ordered   09/12/14 1858  Full code   Continuous     09/12/14 1857     Family Communication:  Disposition Plan: Continue to follow for improvement DVT prophylaxis: Lovenox Consultants: Infectious disease Procedures: Appt with PCP: Requested Antibiotics: Anti-infectives    Start     Dose/Rate Route Frequency Ordered  Stop   09/14/14 1700  ampicillin-sulbactam (UNASYN) 1.5 g in sodium chloride 0.9 % 50 mL IVPB     1.5 g 100 mL/hr over 30 Minutes Intravenous Every 6 hours 09/14/14 1611     09/12/14 2200  vancomycin (VANCOCIN) IVPB 1000 mg/200 mL premix     1,000 mg 200 mL/hr over 60 Minutes Intravenous Every 8 hours 09/12/14 2037     09/12/14 1645  vancomycin (VANCOCIN) IVPB 1000 mg/200 mL premix     1,000 mg 200 mL/hr over 60 Minutes Intravenous  Once 09/12/14 1635 09/12/14 1837      Objective: Filed Weights   09/12/14 1740 09/12/14 1850  Weight: 75.297 kg (166 lb) 75.297 kg (166 lb)    Intake/Output Summary (Last 24 hours) at 09/14/14 1635 Last data filed at 09/14/14 1346  Gross per 24 hour  Intake   5420 ml  Output      0 ml  Net   5420 ml     Vitals Filed Vitals:   09/13/14 1335 09/13/14 2054 09/14/14 0553 09/14/14 1336  BP: 101/68 117/85 109/75 107/75  Pulse: 68 64 54 58  Temp: 97.7 F (36.5 C) 97.9 F (36.6 C) 97.6 F (36.4 C) 98.1 F (36.7 C)  TempSrc: Oral Oral Oral Oral  Resp: 16 16 16    Height:      Weight:      SpO2: 100% 99% 97% 98%    Exam:  General:  Pt is alert, not in acute distress  HEENT: No icterus, No thrush, oral  mucosa moist  Cardiovascular: regular rate and rhythm, S1/S2 No murmur  Respiratory: clear to auscultation bilaterally   Abdomen: Soft, +Bowel sounds, non tender, non distended, no guarding  MSK: No LE edema, cyanosis or clubbing- + left dorsal forearm swelling with pitting and tenderness- healed incision on the left forearm from I & D  Data Reviewed: Basic Metabolic Panel:  Recent Labs Lab 09/12/14 1405 09/14/14 0425  NA 136  --   K 3.7  --   CL 99*  --   CO2 30  --   GLUCOSE 99  --   BUN 5*  --   CREATININE 0.74 0.74  CALCIUM 8.8*  --    Liver Function Tests: No results for input(s): AST, ALT, ALKPHOS, BILITOT, PROT, ALBUMIN in the last 168 hours. No results for input(s): LIPASE, AMYLASE in the last 168 hours. No results  for input(s): AMMONIA in the last 168 hours. CBC:  Recent Labs Lab 09/12/14 1405 09/14/14 0425  WBC 11.5* 8.3  HGB 14.1 12.5*  HCT 41.8 38.5*  MCV 86.9 86.9  PLT 351 294   Cardiac Enzymes: No results for input(s): CKTOTAL, CKMB, CKMBINDEX, TROPONINI in the last 168 hours. BNP (last 3 results) No results for input(s): BNP in the last 8760 hours.  ProBNP (last 3 results) No results for input(s): PROBNP in the last 8760 hours.  CBG: No results for input(s): GLUCAP in the last 168 hours.  Recent Results (from the past 240 hour(s))  Culture, blood (routine x 2)     Status: None (Preliminary result)   Collection Time: 09/12/14  5:00 PM  Result Value Ref Range Status   Specimen Description BLOOD RIGHT ANTECUBITAL  Final   Special Requests BOTTLES DRAWN AEROBIC AND ANAEROBIC  Final   Culture   Final    NO GROWTH 2 DAYS Performed at [facility]    Report Status PENDING  Incomplete  Culture, blood (routine x 2)     Status: None (Preliminary result)   Collection Time: 09/12/14  5:33 PM  Result Value Ref Range Status   Specimen Description BLOOD RIGHT ANTECUBITAL  Final   Special Requests BOTTLES DRAWN AEROBIC AND ANAEROBIC 10CC  Final   Culture   Final    NO GROWTH 2 DAYS Performed at [facility]    Report Status PENDING  Incomplete     Studies: Ct Forearm Left Wo Contrast  09/13/2014   CLINICAL DATA:  LEFT forearm abscess. Lateral wrist abscess from IV drug abuse.  EXAM: CT OF THE LEFT FOREARM WITHOUT CONTRAST  TECHNIQUE: Multidetector CT imaging was performed according to the standard protocol. Multiplanar CT image reconstructions were also generated.  COMPARISON:  None.  FINDINGS: Evaluation for abscess is degraded by noncontrast technique. There is no soft tissue abscess. Diffuse subcutaneous edema is present in the forearm extending from the elbow to the wrist. This is compatible with cellulitis in the appropriate clinical setting. Radius and ulna appear  normal. Distal humerus and visible carpal bones appear normal.  IMPRESSION: No soft tissue abscess. Diffuse subcutaneous edema compatible with cellulitis in the appropriate clinical setting.   Electronically Signed   By: Andreas Newport M.D.   On: 09/13/2014 07:14    Scheduled Meds:  Scheduled Meds: . ampicillin-sulbactam (UNASYN) IV  1.5 g Intravenous Q6H  . enoxaparin (LOVENOX) injection  40 mg Subcutaneous Q24H  . methadone  70 mg Oral Daily  . vancomycin  1,000 mg Intravenous Q8H   Continuous Infusions:  Time spent on care of this patient: 35 min   Leonte Horrigan, MD 09/14/2014, 4:35 PM  LOS: 2 days   [facility] Office  [phone] Pager - Text Page per [email or web address] If 7PM-7AM, please contact night-coverage [email or web address]
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
## 2014-09-15 DIAGNOSIS — L03114 Cellulitis of left upper limb: Secondary | ICD-10-CM

## 2014-09-15 LAB — VANCOMYCIN, TROUGH: Vancomycin Tr: 25 ug/mL — ABNORMAL HIGH (ref 10.0–20.0)

## 2014-09-15 MED ORDER — VANCOMYCIN HCL IN DEXTROSE 1-5 GM/200ML-% IV SOLN
1000.0000 mg | Freq: Two times a day (BID) | INTRAVENOUS | Status: DC
  Administered 2014-09-15 – 2014-09-16 (×2): 1000 mg via INTRAVENOUS
  Filled 2014-09-15 (×3): qty 200

## 2014-09-15 NOTE — Progress Notes (Signed)
ANTIBIOTIC CONSULT NOTE - INITIAL  Pharmacy Consult for vancomycin Indication: cellulitis  No Known Allergies  Patient Measurements: Height: 5\' 11"  (180.3 cm) Weight: 166 lb (75.297 kg) IBW/kg (Calculated) : 75.3  Vital Signs: Temp: 97.7 F (36.5 C) (09/08 0544) Temp Source: Oral (09/08 0544) BP: 123/86 mmHg (09/08 0544) Pulse Rate: 58 (09/08 0544) Intake/Output from previous day: 09/07 0701 - 09/08 0700 In: 3415 [P.O.:2550; I.V.:315; IV Piggyback:550] Out: 1025 [Urine:1025] Intake/Output from this shift:    Labs:  Recent Labs  09/12/14 1405 09/14/14 0425  WBC 11.5* 8.3  HGB 14.1 12.5*  PLT 351 294  CREATININE 0.74 0.74   Estimated Creatinine Clearance: 126.8 mL/min (by C-G formula based on Cr of 0.74). No results for input(s): VANCOTROUGH, VANCOPEAK, VANCORANDOM, GENTTROUGH, GENTPEAK, GENTRANDOM, TOBRATROUGH, TOBRAPEAK, TOBRARND, AMIKACINPEAK, AMIKACINTROU, AMIKACIN in the last 72 hours.   Microbiology: Recent Results (from the past 720 hour(s))  Culture, blood (routine x 2)     Status: None (Preliminary result)   Collection Time: 09/12/14  5:00 PM  Result Value Ref Range Status   Specimen Description BLOOD RIGHT ANTECUBITAL  Final   Special Requests BOTTLES DRAWN AEROBIC AND ANAEROBIC  Final   Culture   Final    NO GROWTH 2 DAYS Performed at [facility]    Report Status PENDING  Incomplete  Culture, blood (routine x 2)     Status: None (Preliminary result)   Collection Time: 09/12/14  5:33 PM  Result Value Ref Range Status   Specimen Description BLOOD RIGHT ANTECUBITAL  Final   Special Requests BOTTLES DRAWN AEROBIC AND ANAEROBIC 10CC  Final   Culture   Final    NO GROWTH 2 DAYS Performed at [facility]    Report Status PENDING  Incomplete   Medical History: Past Medical History  Diagnosis Date  . Substance abuse    Medications:  Anti-infectives    Start     Dose/Rate Route Frequency Ordered Stop   09/14/14 1700   ampicillin-sulbactam (UNASYN) 1.5 g in sodium chloride 0.9 % 50 mL IVPB     1.5 g 100 mL/hr over 30 Minutes Intravenous Every 6 hours 09/14/14 1611     09/12/14 2200  vancomycin (VANCOCIN) IVPB 1000 mg/200 mL premix     1,000 mg 200 mL/hr over 60 Minutes Intravenous Every 8 hours 09/12/14 2037     09/12/14 1645  vancomycin (VANCOCIN) IVPB 1000 mg/200 mL premix     1,000 mg 200 mL/hr over 60 Minutes Intravenous  Once 09/12/14 1635 09/12/14 1837     Assessment: 43 yoM with Hx IVDU, last use two weeks ago presented 5 days ago with cellulitis of the right arm and cellulitis and abscess of the left forearm, under went I&D of the left forearm and was discharged on oral clindamycin. He returns with worsening pain, erythema and swelling and failed outpatient treatment with clindamycin.  Pharmacy to begin vancomycin for purulent cellulitis.  Note code sepsis called and 1 g vancomycin given in ED.  9/5 >> vancomycin >> 9/7 >> Unasyn >>  Temp: afebrile WBC: now wnl Renal: SCr wnl  9/5 blood: ngtd 9/5 Cdiff: ngtd  Vanc trough 9/8 = 25 mcg/ml, above desired range of 15-20 Targeting higher trough d/t worsening cellulitis despite appropriate antibiotics, noted abscess, and lactic acidosis.  May consider reducing target trough since sxs appear to be improving.  Goal of Therapy:  Vancomycin trough level 15-20 mcg/ml Eradication of infection Appropriate antibiotic dosing for indication and renal function  Plan:  Day 4 antibiotics  Day 4 Vancomycin: reduce to 1 g IV q12 hr.   D2 Unasyn 1.5gm q6hr, dose & schedule appropriate  Otho Bellows PharmD Pager [phone] 09/15/2014, 2:22 PM

## 2014-09-15 NOTE — Progress Notes (Signed)
[facility] Progress Note   Johnathan [name]  UEA:[id number]  DOB: 1970-04-27  DOA: 09/12/2014 PCP: Karle Plumber, MD  Brief narrative: Johnathan [name] is a 44 y.o. male with a history of IV drug abuse who presents with increasing cellulitis of the right arm and cellulitis and abscess of left forearm despite being started on clindamycin on 9/1. States that it started off as a small knot in his forearm about 3 weeks ago when he last injected himself with oxycodone but then subsequently spread. He underwent an I and D of the left forearm abscess on 9/1-cultures not sent. After starting clindamycin, he states swelling extended further up the forearm and he therefore presented to the ER once again. Admitted and started on vancomycin. Hep C antibody found to be positive. He has been going to a methadone clinic about 4 weeks now. He last injected IV Roxicodone about 3 weeks ago.   Subjective: States there is no change in pain and swelling of left forearm. No new symptoms  Assessment/Plan: Principal Problem: Cellulitis of bilateral forearms-abscess of left forearm status post I & D -in relation to IV injection of Roxicodone last injected about 3 weeks ago -no culture sent-no significant improvement with vancomycin-I have consult infectious disease - added Unasyn 9/7-I feel there has been some mild improvement in swelling of left forearm overnight  Active Problems:   Hepatitis C antibody test positive -Follow up hep C viral load -Hep B serology negative    Substance abuse -Currently in a methadone clinic for his drug abuse-continue methadone    Cigarette smoker -Advised to discontinue   Code Status:     Code Status Orders        Start     Ordered   09/12/14 1858  Full code   Continuous     09/12/14 1857     Family Communication:  Disposition Plan: Continue to follow for improvement DVT prophylaxis: Lovenox Consultants: Infectious disease Procedures: Appt  with PCP: Requested Antibiotics: Anti-infectives    Start     Dose/Rate Route Frequency Ordered Stop   09/15/14 1800  vancomycin (VANCOCIN) IVPB 1000 mg/200 mL premix     1,000 mg 200 mL/hr over 60 Minutes Intravenous Every 12 hours 09/15/14 1417     09/14/14 1700  ampicillin-sulbactam (UNASYN) 1.5 g in sodium chloride 0.9 % 50 mL IVPB     1.5 g 100 mL/hr over 30 Minutes Intravenous Every 6 hours 09/14/14 1611     09/12/14 2200  vancomycin (VANCOCIN) IVPB 1000 mg/200 mL premix  Status:  Discontinued     1,000 mg 200 mL/hr over 60 Minutes Intravenous Every 8 hours 09/12/14 2037 09/15/14 1416   09/12/14 1645  vancomycin (VANCOCIN) IVPB 1000 mg/200 mL premix     1,000 mg 200 mL/hr over 60 Minutes Intravenous  Once 09/12/14 1635 09/12/14 1837      Objective: Filed Weights   09/12/14 1740 09/12/14 1850  Weight: 75.297 kg (166 lb) 75.297 kg (166 lb)    Intake/Output Summary (Last 24 hours) at 09/15/14 1554 Last data filed at 09/15/14 1350  Gross per 24 hour  Intake   2500 ml  Output   1025 ml  Net   1475 ml     Vitals Filed Vitals:   09/14/14 1336 09/14/14 2121 09/15/14 0544 09/15/14 1325  BP: 107/75 126/75 123/86 113/78  Pulse: 58 53 58 56  Temp: 98.1 F (36.7 C) 98.4 F (36.9 C) 97.7 F (36.5 C) 98.2 F (36.8 C)  TempSrc: Oral  Oral Oral Oral  Resp:  16 16 16   Height:      Weight:      SpO2: 98% 98% 100% 96%    Exam:  General:  Pt is alert, not in acute distress  HEENT: No icterus, No thrush, oral mucosa moist  Cardiovascular: regular rate and rhythm, S1/S2 No murmur  Respiratory: clear to auscultation bilaterally   Abdomen: Soft, +Bowel sounds, non tender, non distended, no guarding  MSK: No LE edema, cyanosis or clubbing- + mildly improved left dorsal forearm swelling with less pitting-continued tenderness- healed incision on the left forearm from I & D  Data Reviewed: Basic Metabolic Panel:  Recent Labs Lab 09/12/14 1405 09/14/14 0425  NA 136  --    K 3.7  --   CL 99*  --   CO2 30  --   GLUCOSE 99  --   BUN 5*  --   CREATININE 0.74 0.74  CALCIUM 8.8*  --    Liver Function Tests: No results for input(s): AST, ALT, ALKPHOS, BILITOT, PROT, ALBUMIN in the last 168 hours. No results for input(s): LIPASE, AMYLASE in the last 168 hours. No results for input(s): AMMONIA in the last 168 hours. CBC:  Recent Labs Lab 09/12/14 1405 09/14/14 0425  WBC 11.5* 8.3  HGB 14.1 12.5*  HCT 41.8 38.5*  MCV 86.9 86.9  PLT 351 294   Cardiac Enzymes: No results for input(s): CKTOTAL, CKMB, CKMBINDEX, TROPONINI in the last 168 hours. BNP (last 3 results) No results for input(s): BNP in the last 8760 hours.  ProBNP (last 3 results) No results for input(s): PROBNP in the last 8760 hours.  CBG: No results for input(s): GLUCAP in the last 168 hours.  Recent Results (from the past 240 hour(s))  Culture, blood (routine x 2)     Status: None (Preliminary result)   Collection Time: 09/12/14  5:00 PM  Result Value Ref Range Status   Specimen Description BLOOD RIGHT ANTECUBITAL  Final   Special Requests BOTTLES DRAWN AEROBIC AND ANAEROBIC  Final   Culture   Final    NO GROWTH 3 DAYS Performed at [facility]    Report Status PENDING  Incomplete  Culture, blood (routine x 2)     Status: None (Preliminary result)   Collection Time: 09/12/14  5:33 PM  Result Value Ref Range Status   Specimen Description BLOOD RIGHT ANTECUBITAL  Final   Special Requests BOTTLES DRAWN AEROBIC AND ANAEROBIC 10CC  Final   Culture   Final    NO GROWTH 3 DAYS Performed at [facility]    Report Status PENDING  Incomplete     Studies: No results found.  Scheduled Meds:  Scheduled Meds: . ampicillin-sulbactam (UNASYN) IV  1.5 g Intravenous Q6H  . enoxaparin (LOVENOX) injection  40 mg Subcutaneous Q24H  . methadone  70 mg Oral Daily  . vancomycin  1,000 mg Intravenous Q12H   Continuous Infusions:    Time spent on care of this  patient: 35 min   Subhan Hoopes, MD 09/15/2014, 3:54 PM  LOS: 3 days   [facility] Office  [phone] Pager - Text Page per [email or web address] If 7PM-7AM, please contact night-coverage [email or web address]

## 2014-09-15 NOTE — Progress Notes (Signed)
Patient ID: Johnathan [name], male   DOB: 03-Sep-1970, 44 y.o.   MRN: [id number]         [facility] for Infectious Disease    Date of Admission:  09/12/2014    Total days of antibiotics 8        Day 4 vancomycin        Day 2 ampicillin sulbactam          Principal Problem:   Abscess of arm, left Active Problems:   Abscess of arm, right   Hepatitis C antibody test positive   Substance abuse   Cigarette smoker   Poor dentition   . ampicillin-sulbactam (UNASYN) IV  1.5 g Intravenous Q6H  . enoxaparin (LOVENOX) injection  40 mg Subcutaneous Q24H  . methadone  70 mg Oral Daily  . vancomycin  1,000 mg Intravenous Q12H    SUBJECTIVE: He is feeling a little bit better today with decreased pain and swelling in his left arm.  Review of Systems: Pertinent items are noted in HPI.  Past Medical History  Diagnosis Date  . Substance abuse     Social History  Substance Use Topics  . Smoking status: Current Every Day Smoker -- 0.50 packs/day for 15 years    Types: Cigarettes  . Smokeless tobacco: None  . Alcohol Use: No     Comment: seldom once a year     History reviewed. No pertinent family history. No Known Allergies  OBJECTIVE: Filed Vitals:   09/14/14 1336 09/14/14 2121 09/15/14 0544 09/15/14 1325  BP: 107/75 126/75 123/86 113/78  Pulse: 58 53 58 56  Temp: 98.1 F (36.7 C) 98.4 F (36.9 C) 97.7 F (36.5 C) 98.2 F (36.8 C)  TempSrc: Oral Oral Oral Oral  Resp:  16 16 16   Height:      Weight:      SpO2: 98% 98% 100% 96%   Body mass index is 23.16 kg/(m^2).  General: He is alert and comfortable watching television Lungs: Clear Cor: Regular S1 and S2 with no murmurs Abdomen: Soft and nontender with no palpable masses Joints and extremities: Decreasing induration around left forearm and elbow  Lab Results Lab Results  Component Value Date   WBC 8.3 09/14/2014   HGB 12.5* 09/14/2014   HCT 38.5* 09/14/2014   MCV 86.9 09/14/2014   PLT 294 09/14/2014      Lab Results  Component Value Date   CREATININE 0.74 09/14/2014   BUN 5* 09/12/2014   NA 136 09/12/2014   K 3.7 09/12/2014   CL 99* 09/12/2014   CO2 30 09/12/2014   No results found for: ALT, AST, GGT, ALKPHOS, BILITOT   Microbiology: Recent Results (from the past 240 hour(s))  Culture, blood (routine x 2)     Status: None (Preliminary result)   Collection Time: 09/12/14  5:00 PM  Result Value Ref Range Status   Specimen Description BLOOD RIGHT ANTECUBITAL  Final   Special Requests BOTTLES DRAWN AEROBIC AND ANAEROBIC  Final   Culture   Final    NO GROWTH 3 DAYS Performed at [facility]    Report Status PENDING  Incomplete  Culture, blood (routine x 2)     Status: None (Preliminary result)   Collection Time: 09/12/14  5:33 PM  Result Value Ref Range Status   Specimen Description BLOOD RIGHT ANTECUBITAL  Final   Special Requests BOTTLES DRAWN AEROBIC AND ANAEROBIC 10CC  Final   Culture   Final    NO GROWTH 3  DAYS Performed at [facility]    Report Status PENDING  Incomplete     ASSESSMENT: His left forearm soft tissue infection is improving slowly on broad empiric anabiotic therapy. If he continues to improve I would consider switching to oral doxycycline and amoxicillin clavulanate tomorrow and treating for another 7-10 days.  PLAN: 1. Continue current antibiotics for now 2. Await results of hepatitis C viral load 3. I will follow-up tomorrow morning  Cliffton Asters, MD [facility] for Infect[facility] Disease [facility] [phone] pager   [phone] cell 09/15/2014, 2:30 PM

## 2014-09-16 DIAGNOSIS — F1910 Other psychoactive substance abuse, uncomplicated: Secondary | ICD-10-CM

## 2014-09-16 LAB — HEPATITIS C VRS RNA DETECT BY PCR-QUAL: HEPATITIS C VRS RNA BY PCR-QUAL: POSITIVE — AB

## 2014-09-16 LAB — HEPATITIS B SURFACE ANTIGEN: HEP B S AG: NEGATIVE

## 2014-09-16 LAB — HEPATITIS B SURFACE ANTIBODY, QUANTITATIVE

## 2014-09-16 MED ORDER — AMOXICILLIN-POT CLAVULANATE 875-125 MG PO TABS: 1.0000 | ORAL_TABLET | Freq: Two times a day (BID) | ORAL | Status: DC

## 2014-09-16 MED ORDER — DOXYCYCLINE HYCLATE 100 MG PO CAPS: 100.0000 mg | ORAL_CAPSULE | Freq: Two times a day (BID) | ORAL | Status: DC

## 2014-09-16 NOTE — Progress Notes (Signed)
Patient ID: Johnathan [name], male   DOB: 1970/04/02, 44 y.o.   MRN: [id number]         [facility] for Infectious Disease    Date of Admission:  09/12/2014    Total days of antibiotics 9        Day 5 vancomycin        Day 3 ampicillin sulbactam          Principal Problem:   Abscess of arm, left Active Problems:   Hepatitis C antibody test positive   Substance abuse   Cigarette smoker   Poor dentition   Cellulitis of left upper extremity   . ampicillin-sulbactam (UNASYN) IV  1.5 g Intravenous Q6H  . enoxaparin (LOVENOX) injection  40 mg Subcutaneous Q24H  . methadone  70 mg Oral Daily  . vancomycin  1,000 mg Intravenous Q12H    SUBJECTIVE: He is feeling better.  Review of Systems: Pertinent items are noted in HPI.  Past Medical History  Diagnosis Date  . Substance abuse     Social History  Substance Use Topics  . Smoking status: Current Every Day Smoker -- 0.50 packs/day for 15 years    Types: Cigarettes  . Smokeless tobacco: None  . Alcohol Use: No     Comment: seldom once a year     History reviewed. No pertinent family history. No Known Allergies  OBJECTIVE: Filed Vitals:   09/15/14 0544 09/15/14 1325 09/15/14 1959 09/16/14 0612  BP: 123/86 113/78 116/78 121/76  Pulse: 58 56 55 70  Temp: 97.7 F (36.5 C) 98.2 F (36.8 C) 98.3 F (36.8 C) 98.3 F (36.8 C)  TempSrc: Oral Oral Oral Oral  Resp: Height:      Weight:      SpO2: 100% 96% 97% 97%   Body mass index is 23.16 kg/(m^2).  General: He is alert and comfortable  Lungs: Clear Cor: Regular S1 and S2 with no murmurs Abdomen: Soft and nontender with no palpable masses Joints and extremities: Decreasing induration around left forearm   Lab Results Lab Results  Component Value Date   WBC 8.3 09/14/2014   HGB 12.5* 09/14/2014   HCT 38.5* 09/14/2014   MCV 86.9 09/14/2014   PLT 294 09/14/2014    Lab Results  Component Value Date   CREATININE 0.74 09/14/2014   BUN 5*  09/12/2014   NA 136 09/12/2014   K 3.7 09/12/2014   CL 99* 09/12/2014   CO2 30 09/12/2014   No results found for: ALT, AST, GGT, ALKPHOS, BILITOT   Microbiology: Recent Results (from the past 240 hour(s))  Culture, blood (routine x 2)     Status: None (Preliminary result)   Collection Time: 09/12/14  5:00 PM  Result Value Ref Range Status   Specimen Description BLOOD RIGHT ANTECUBITAL  Final   Special Requests BOTTLES DRAWN AEROBIC AND ANAEROBIC  Final   Culture   Final    NO GROWTH 3 DAYS Performed at [facility]    Report Status PENDING  Incomplete  Culture, blood (routine x 2)     Status: None (Preliminary result)   Collection Time: 09/12/14  5:33 PM  Result Value Ref Range Status   Specimen Description BLOOD RIGHT ANTECUBITAL  Final   Special Requests BOTTLES DRAWN AEROBIC AND ANAEROBIC 10CC  Final   Culture   Final    NO GROWTH 3 DAYS Performed at [facility]    Report Status PENDING  Incomplete  ASSESSMENT: He is improving on empiric antibiotics. I recommend discharge home today with one more week of oral doxycycline and amoxicillin clavulanate. His qualitative hepatitis C viral load is positive. I have ordered a quantitative viral load.  PLAN: 1. Discharge home on 7 days of doxycycline 100 mg twice daily and amoxicillin clavulanate 875/125 mg twice daily 2. I will arrange follow-up in my clinic on 09/29/2014  Cliffton Asters, MD [facility] [phone] pager   [phone] cell 09/16/2014, 11:08 AM

## 2014-09-16 NOTE — Progress Notes (Signed)
Pt DC home.  he was provided prescriptions, education, and instructions.

## 2014-09-16 NOTE — Discharge Summary (Signed)
Physician Discharge Summary  Johnathan [name] ZOX:[id number] DOB: June 13, 1970 DOA: 09/12/2014  PCP: Karle Plumber, MD  Admit date: 09/12/2014 Discharge date: 09/16/2014  Time spent: 50 minutes  Recommendations for Outpatient Follow-up:  1. F/u with Dr Orvan Falconer  Discharge Condition: stable  Discharge Diagnoses:  Principal Problem:   Abscess of arm, left Active Problems:   Cellulitis of left upper extremity   Hepatitis C antibody test positive   Substance abuse   Cigarette smoker   Poor dentition   History of present illness:  Johnathan [name] is a 44 y.o. male with a history of IV drug abuse who presents with increasing cellulitis of the right arm and cellulitis and abscess of left forearm despite being started on clindamycin on 9/1. States that it started off as a small knot in his forearm about 3 weeks ago when he last injected himself with oxycodone but then subsequently spread. He underwent an I and D of the left forearm abscess on 9/1-cultures not sent. After starting clindamycin, he states swelling extended further up the forearm and he therefore presented to the ER once again. Admitted and started on vancomycin. Hep C antibody found to be positive. He has been going to a methadone clinic about 4 weeks now. He last injected IV Roxicodone about 3 weeks ago.  Hospital Course:  Principal Problem: Cellulitis of bilateral forearms-abscess of left forearm status post I & D -in relation to IV injection of Roxicodone last injected about 3 weeks ago -no culture sent on 9/1 -no significant improvement with vancomycin-I have consulted infectious disease who added Unasyn 9/7-There has been improvement in swelling of left forearm - transition to Augmentin and Doxycycline for 1 more wk.   Active Problems:  Hepatitis C antibody test positive -Follow up hep C viral load +- f/u on quantitative- will f/u with Dr Orvan Falconer in 2 wks- explained the risk of transmission to others -Hep B  serology negative   Substance abuse -Currently in a methadone clinic for his drug abuse-continue methadone   Cigarette smoker -Advised to discontinue  Procedures:    Consultations:  ID  Discharge Exam: Filed Weights   09/12/14 1740 09/12/14 1850  Weight: 75.297 kg (166 lb) 75.297 kg (166 lb)   Filed Vitals:   09/16/14 0612  BP: 121/76  Pulse: 70  Temp: 98.3 F (36.8 C)  Resp: 16    General: AAO x 3, no distress Cardiovascular: RRR, no murmurs  Respiratory: clear to auscultation bilaterally GI: soft, non-tender, non-distended, bowel sound positive MSK: No LE edema, cyanosis or clubbing-  improving left forearm swelling with less pitting- continued mild to moderate tenderness  healed incision on the left forearm from I & D  Discharge Instructions You were cared for by a hospitalist during your hospital stay. If you have any questions about your discharge medications or the care you received while you were in the hospital after you are discharged, you can call the unit and asked to speak with the hospitalist on call if the hospitalist that took care of you is not available. Once you are discharged, your primary care physician will handle any further medical issues. Please note that NO REFILLS for any discharge medications will be authorized once you are discharged, as it is imperative that you return to your primary care physician (or establish a relationship with a primary care physician if you do not have one) for your aftercare needs so that they can reassess your need for medications and monitor your lab values.  Discharge Instructions    Diet - low sodium heart healthy    Complete by:  As directed      Increase activity slowly    Complete by:  As directed             Medication List    STOP taking these medications        clindamycin 300 MG capsule  Commonly known as:  CLEOCIN      TAKE these medications        amoxicillin-clavulanate 875-125 MG per  tablet  Commonly known as:  AUGMENTIN  Take 1 tablet by mouth 2 (two) times daily.     doxycycline 100 MG capsule  Commonly known as:  VIBRAMYCIN  Take 1 capsule (100 mg total) by mouth 2 (two) times daily.     methadone 10 MG tablet  Commonly known as:  DOLOPHINE  Take 70 mg by mouth daily. Receives from [facility] in [place]     naproxen sodium 220 MG tablet  Commonly known as:  ANAPROX  Take 440 mg by mouth every 12 (twelve) hours as needed (pain).      ASK your doctor about these medications        cyclobenzaprine 10 MG tablet  Commonly known as:  FLEXERIL  Take 1 tablet (10 mg total) by mouth 2 (two) times daily as needed for muscle spasms.     ibuprofen 800 MG tablet  Commonly known as:  ADVIL,MOTRIN  Take 1 tablet (800 mg total) by mouth 3 (three) times daily.       No Known Allergies    The results of significant diagnostics from this hospitalization (including imaging, microbiology, ancillary and laboratory) are listed below for reference.    Significant Diagnostic Studies: Ct Forearm Left Wo Contrast  09/13/2014   CLINICAL DATA:  LEFT forearm abscess. Lateral wrist abscess from IV drug abuse.  EXAM: CT OF THE LEFT FOREARM WITHOUT CONTRAST  TECHNIQUE: Multidetector CT imaging was performed according to the standard protocol. Multiplanar CT image reconstructions were also generated.  COMPARISON:  None.  FINDINGS: Evaluation for abscess is degraded by noncontrast technique. There is no soft tissue abscess. Diffuse subcutaneous edema is present in the forearm extending from the elbow to the wrist. This is compatible with cellulitis in the appropriate clinical setting. Radius and ulna appear normal. Distal humerus and visible carpal bones appear normal.  IMPRESSION: No soft tissue abscess. Diffuse subcutaneous edema compatible with cellulitis in the appropriate clinical setting.   Electronically Signed   By: Andreas Newport M.D.   On: 09/13/2014 07:14     Microbiology: Recent Results (from the past 240 hour(s))  Culture, blood (routine x 2)     Status: None (Preliminary result)   Collection Time: 09/12/14  5:00 PM  Result Value Ref Range Status   Specimen Description BLOOD RIGHT ANTECUBITAL  Final   Special Requests BOTTLES DRAWN AEROBIC AND ANAEROBIC  Final   Culture   Final    NO GROWTH 3 DAYS Performed at [facility]    Report Status PENDING  Incomplete  Culture, blood (routine x 2)     Status: None (Preliminary result)   Collection Time: 09/12/14  5:33 PM  Result Value Ref Range Status   Specimen Description BLOOD RIGHT ANTECUBITAL  Final   Special Requests BOTTLES DRAWN AEROBIC AND ANAEROBIC 10CC  Final   Culture   Final    NO GROWTH 3 DAYS Performed at [facility]  Report Status PENDING  Incomplete     Labs: Basic Metabolic Panel:  Recent Labs Lab 09/12/14 1405 09/14/14 0425  NA 136  --   K 3.7  --   CL 99*  --   CO2 30  --   GLUCOSE 99  --   BUN 5*  --   CREATININE 0.74 0.74  CALCIUM 8.8*  --    Liver Function Tests: No results for input(s): AST, ALT, ALKPHOS, BILITOT, PROT, ALBUMIN in the last 168 hours. No results for input(s): LIPASE, AMYLASE in the last 168 hours. No results for input(s): AMMONIA in the last 168 hours. CBC:  Recent Labs Lab 09/12/14 1405 09/14/14 0425  WBC 11.5* 8.3  HGB 14.1 12.5*  HCT 41.8 38.5*  MCV 86.9 86.9  PLT 351 294   Cardiac Enzymes: No results for input(s): CKTOTAL, CKMB, CKMBINDEX, TROPONINI in the last 168 hours. BNP: BNP (last 3 results) No results for input(s): BNP in the last 8760 hours.  ProBNP (last 3 results) No results for input(s): PROBNP in the last 8760 hours.  CBG: No results for input(s): GLUCAP in the last 168 hours.     SignedCalvert Cantor, MD [facility] 09/16/2014, 9:50 AM

## 2014-09-17 LAB — CULTURE, BLOOD (ROUTINE X 2)
CULTURE: NO GROWTH
Culture: NO GROWTH

## 2014-09-26 LAB — HEPATITIS C GENOTYPE

## 2014-09-26 LAB — HCV RNA QUANT RFLX ULTRA OR GENOTYP
HCV RNA Qnt(log copy/mL): 4.692 log10 IU/mL
HEPATITIS C QUANTITATION: 49200 [IU]/mL

## 2014-09-29 ENCOUNTER — Ambulatory Visit: Payer: Medicaid Other | Admitting: Internal Medicine

## 2016-07-18 ENCOUNTER — Encounter (HOSPITAL_COMMUNITY): Payer: Self-pay | Admitting: Emergency Medicine

## 2016-07-18 DIAGNOSIS — D6489 Other specified anemias: Secondary | ICD-10-CM | POA: Diagnosis present

## 2016-07-18 DIAGNOSIS — E876 Hypokalemia: Secondary | ICD-10-CM | POA: Diagnosis present

## 2016-07-18 DIAGNOSIS — G894 Chronic pain syndrome: Secondary | ICD-10-CM | POA: Diagnosis present

## 2016-07-18 DIAGNOSIS — R188 Other ascites: Secondary | ICD-10-CM | POA: Diagnosis present

## 2016-07-18 DIAGNOSIS — D696 Thrombocytopenia, unspecified: Secondary | ICD-10-CM | POA: Diagnosis present

## 2016-07-18 DIAGNOSIS — G92 Toxic encephalopathy: Secondary | ICD-10-CM | POA: Diagnosis not present

## 2016-07-18 DIAGNOSIS — F10239 Alcohol dependence with withdrawal, unspecified: Secondary | ICD-10-CM | POA: Diagnosis present

## 2016-07-18 DIAGNOSIS — E162 Hypoglycemia, unspecified: Secondary | ICD-10-CM | POA: Diagnosis not present

## 2016-07-18 DIAGNOSIS — J942 Hemothorax: Secondary | ICD-10-CM | POA: Diagnosis not present

## 2016-07-18 DIAGNOSIS — N179 Acute kidney failure, unspecified: Secondary | ICD-10-CM | POA: Diagnosis present

## 2016-07-18 DIAGNOSIS — E860 Dehydration: Secondary | ICD-10-CM | POA: Diagnosis not present

## 2016-07-18 DIAGNOSIS — W500XXA Accidental hit or strike by another person, initial encounter: Secondary | ICD-10-CM | POA: Diagnosis present

## 2016-07-18 DIAGNOSIS — F1123 Opioid dependence with withdrawal: Secondary | ICD-10-CM | POA: Diagnosis present

## 2016-07-18 DIAGNOSIS — E0781 Sick-euthyroid syndrome: Secondary | ICD-10-CM | POA: Diagnosis present

## 2016-07-18 DIAGNOSIS — E43 Unspecified severe protein-calorie malnutrition: Secondary | ICD-10-CM | POA: Diagnosis present

## 2016-07-18 DIAGNOSIS — J869 Pyothorax without fistula: Secondary | ICD-10-CM | POA: Diagnosis not present

## 2016-07-18 DIAGNOSIS — Z23 Encounter for immunization: Secondary | ICD-10-CM

## 2016-07-18 DIAGNOSIS — Z681 Body mass index (BMI) 19 or less, adult: Secondary | ICD-10-CM

## 2016-07-18 DIAGNOSIS — R6520 Severe sepsis without septic shock: Secondary | ICD-10-CM | POA: Diagnosis present

## 2016-07-18 DIAGNOSIS — E8770 Fluid overload, unspecified: Secondary | ICD-10-CM | POA: Diagnosis not present

## 2016-07-18 DIAGNOSIS — I330 Acute and subacute infective endocarditis: Secondary | ICD-10-CM | POA: Diagnosis present

## 2016-07-18 DIAGNOSIS — I10 Essential (primary) hypertension: Secondary | ICD-10-CM | POA: Diagnosis present

## 2016-07-18 DIAGNOSIS — E870 Hyperosmolality and hypernatremia: Secondary | ICD-10-CM | POA: Diagnosis not present

## 2016-07-18 DIAGNOSIS — I2690 Septic pulmonary embolism without acute cor pulmonale: Secondary | ICD-10-CM | POA: Diagnosis present

## 2016-07-18 DIAGNOSIS — L03115 Cellulitis of right lower limb: Secondary | ICD-10-CM | POA: Diagnosis present

## 2016-07-18 DIAGNOSIS — S99921A Unspecified injury of right foot, initial encounter: Secondary | ICD-10-CM | POA: Diagnosis present

## 2016-07-18 DIAGNOSIS — J9601 Acute respiratory failure with hypoxia: Secondary | ICD-10-CM | POA: Diagnosis not present

## 2016-07-18 DIAGNOSIS — J918 Pleural effusion in other conditions classified elsewhere: Secondary | ICD-10-CM | POA: Diagnosis present

## 2016-07-18 DIAGNOSIS — E871 Hypo-osmolality and hyponatremia: Secondary | ICD-10-CM | POA: Diagnosis not present

## 2016-07-18 DIAGNOSIS — G039 Meningitis, unspecified: Secondary | ICD-10-CM | POA: Diagnosis present

## 2016-07-18 DIAGNOSIS — K7460 Unspecified cirrhosis of liver: Secondary | ICD-10-CM | POA: Diagnosis present

## 2016-07-18 DIAGNOSIS — B1910 Unspecified viral hepatitis B without hepatic coma: Secondary | ICD-10-CM | POA: Diagnosis present

## 2016-07-18 DIAGNOSIS — I079 Rheumatic tricuspid valve disease, unspecified: Secondary | ICD-10-CM | POA: Diagnosis present

## 2016-07-18 DIAGNOSIS — B1920 Unspecified viral hepatitis C without hepatic coma: Secondary | ICD-10-CM | POA: Diagnosis present

## 2016-07-18 DIAGNOSIS — A4101 Sepsis due to Methicillin susceptible Staphylococcus aureus: Principal | ICD-10-CM | POA: Diagnosis present

## 2016-07-18 DIAGNOSIS — D689 Coagulation defect, unspecified: Secondary | ICD-10-CM | POA: Diagnosis not present

## 2016-07-18 DIAGNOSIS — G931 Anoxic brain damage, not elsewhere classified: Secondary | ICD-10-CM | POA: Diagnosis present

## 2016-07-18 DIAGNOSIS — F17210 Nicotine dependence, cigarettes, uncomplicated: Secondary | ICD-10-CM | POA: Diagnosis present

## 2016-07-18 DIAGNOSIS — E872 Acidosis: Secondary | ICD-10-CM | POA: Diagnosis not present

## 2016-07-18 DIAGNOSIS — I82622 Acute embolism and thrombosis of deep veins of left upper extremity: Secondary | ICD-10-CM | POA: Diagnosis present

## 2016-07-18 NOTE — ED Triage Notes (Addendum)
Patient reports worsening swelling/redness at bilateral feet more at right onset last week , denies injury , pt. added posterior neck pain , intermittent fever /respirations unlabored .

## 2016-07-19 ENCOUNTER — Inpatient Hospital Stay (HOSPITAL_COMMUNITY)
Admission: EM | Admit: 2016-07-19 | Discharge: 2016-09-03 | DRG: 853 | Disposition: A | Discharge status: Home / Self Care | Payer: Medicaid Other | Attending: Internal Medicine | Admitting: Internal Medicine

## 2016-07-19 ENCOUNTER — Inpatient Hospital Stay (HOSPITAL_COMMUNITY): Payer: Medicaid Other

## 2016-07-19 ENCOUNTER — Emergency Department (HOSPITAL_COMMUNITY): Payer: Medicaid Other

## 2016-07-19 ENCOUNTER — Encounter (HOSPITAL_COMMUNITY): Payer: Self-pay | Admitting: Internal Medicine

## 2016-07-19 DIAGNOSIS — G9340 Encephalopathy, unspecified: Secondary | ICD-10-CM

## 2016-07-19 DIAGNOSIS — R7881 Bacteremia: Secondary | ICD-10-CM | POA: Diagnosis not present

## 2016-07-19 DIAGNOSIS — F10939 Alcohol use, unspecified with withdrawal, unspecified: Secondary | ICD-10-CM

## 2016-07-19 DIAGNOSIS — J189 Pneumonia, unspecified organism: Secondary | ICD-10-CM | POA: Diagnosis present

## 2016-07-19 DIAGNOSIS — R6520 Severe sepsis without septic shock: Secondary | ICD-10-CM

## 2016-07-19 DIAGNOSIS — G931 Anoxic brain damage, not elsewhere classified: Secondary | ICD-10-CM

## 2016-07-19 DIAGNOSIS — B1910 Unspecified viral hepatitis B without hepatic coma: Secondary | ICD-10-CM | POA: Diagnosis present

## 2016-07-19 DIAGNOSIS — E43 Unspecified severe protein-calorie malnutrition: Secondary | ICD-10-CM | POA: Diagnosis present

## 2016-07-19 DIAGNOSIS — K7460 Unspecified cirrhosis of liver: Secondary | ICD-10-CM

## 2016-07-19 DIAGNOSIS — I2699 Other pulmonary embolism without acute cor pulmonale: Secondary | ICD-10-CM

## 2016-07-19 DIAGNOSIS — I368 Other nonrheumatic tricuspid valve disorders: Secondary | ICD-10-CM

## 2016-07-19 DIAGNOSIS — E870 Hyperosmolality and hypernatremia: Secondary | ICD-10-CM | POA: Diagnosis not present

## 2016-07-19 DIAGNOSIS — R4182 Altered mental status, unspecified: Secondary | ICD-10-CM

## 2016-07-19 DIAGNOSIS — F17210 Nicotine dependence, cigarettes, uncomplicated: Secondary | ICD-10-CM

## 2016-07-19 DIAGNOSIS — F1990 Other psychoactive substance use, unspecified, uncomplicated: Secondary | ICD-10-CM | POA: Diagnosis present

## 2016-07-19 DIAGNOSIS — Z23 Encounter for immunization: Secondary | ICD-10-CM | POA: Diagnosis not present

## 2016-07-19 DIAGNOSIS — J9691 Respiratory failure, unspecified with hypoxia: Secondary | ICD-10-CM

## 2016-07-19 DIAGNOSIS — F1193 Opioid use, unspecified with withdrawal: Secondary | ICD-10-CM

## 2016-07-19 DIAGNOSIS — R112 Nausea with vomiting, unspecified: Secondary | ICD-10-CM

## 2016-07-19 DIAGNOSIS — A419 Sepsis, unspecified organism: Secondary | ICD-10-CM | POA: Diagnosis present

## 2016-07-19 DIAGNOSIS — Z9689 Presence of other specified functional implants: Secondary | ICD-10-CM

## 2016-07-19 DIAGNOSIS — J942 Hemothorax: Secondary | ICD-10-CM | POA: Diagnosis not present

## 2016-07-19 DIAGNOSIS — G039 Meningitis, unspecified: Secondary | ICD-10-CM | POA: Diagnosis present

## 2016-07-19 DIAGNOSIS — R188 Other ascites: Secondary | ICD-10-CM

## 2016-07-19 DIAGNOSIS — Z4659 Encounter for fitting and adjustment of other gastrointestinal appliance and device: Secondary | ICD-10-CM

## 2016-07-19 DIAGNOSIS — E871 Hypo-osmolality and hyponatremia: Secondary | ICD-10-CM | POA: Diagnosis present

## 2016-07-19 DIAGNOSIS — G92 Toxic encephalopathy: Secondary | ICD-10-CM | POA: Diagnosis not present

## 2016-07-19 DIAGNOSIS — L8990 Pressure ulcer of unspecified site, unspecified stage: Secondary | ICD-10-CM | POA: Insufficient documentation

## 2016-07-19 DIAGNOSIS — I82622 Acute embolism and thrombosis of deep veins of left upper extremity: Secondary | ICD-10-CM | POA: Diagnosis present

## 2016-07-19 DIAGNOSIS — F1123 Opioid dependence with withdrawal: Secondary | ICD-10-CM | POA: Diagnosis not present

## 2016-07-19 DIAGNOSIS — N3000 Acute cystitis without hematuria: Secondary | ICD-10-CM

## 2016-07-19 DIAGNOSIS — J9601 Acute respiratory failure with hypoxia: Secondary | ICD-10-CM | POA: Diagnosis not present

## 2016-07-19 DIAGNOSIS — I079 Rheumatic tricuspid valve disease, unspecified: Secondary | ICD-10-CM | POA: Diagnosis present

## 2016-07-19 DIAGNOSIS — T1490XA Injury, unspecified, initial encounter: Secondary | ICD-10-CM

## 2016-07-19 DIAGNOSIS — J869 Pyothorax without fistula: Secondary | ICD-10-CM | POA: Diagnosis not present

## 2016-07-19 DIAGNOSIS — J939 Pneumothorax, unspecified: Secondary | ICD-10-CM

## 2016-07-19 DIAGNOSIS — N179 Acute kidney failure, unspecified: Secondary | ICD-10-CM | POA: Diagnosis present

## 2016-07-19 DIAGNOSIS — F10239 Alcohol dependence with withdrawal, unspecified: Secondary | ICD-10-CM

## 2016-07-19 DIAGNOSIS — N189 Chronic kidney disease, unspecified: Secondary | ICD-10-CM

## 2016-07-19 DIAGNOSIS — J918 Pleural effusion in other conditions classified elsewhere: Secondary | ICD-10-CM | POA: Diagnosis present

## 2016-07-19 DIAGNOSIS — M7989 Other specified soft tissue disorders: Secondary | ICD-10-CM | POA: Diagnosis not present

## 2016-07-19 DIAGNOSIS — E872 Acidosis: Secondary | ICD-10-CM | POA: Diagnosis not present

## 2016-07-19 DIAGNOSIS — J90 Pleural effusion, not elsewhere classified: Secondary | ICD-10-CM

## 2016-07-19 DIAGNOSIS — I2690 Septic pulmonary embolism without acute cor pulmonale: Secondary | ICD-10-CM | POA: Diagnosis present

## 2016-07-19 DIAGNOSIS — B9561 Methicillin susceptible Staphylococcus aureus infection as the cause of diseases classified elsewhere: Secondary | ICD-10-CM | POA: Diagnosis present

## 2016-07-19 DIAGNOSIS — R509 Fever, unspecified: Secondary | ICD-10-CM | POA: Insufficient documentation

## 2016-07-19 DIAGNOSIS — R945 Abnormal results of liver function studies: Secondary | ICD-10-CM | POA: Diagnosis present

## 2016-07-19 DIAGNOSIS — W500XXA Accidental hit or strike by another person, initial encounter: Secondary | ICD-10-CM | POA: Diagnosis present

## 2016-07-19 DIAGNOSIS — L03115 Cellulitis of right lower limb: Secondary | ICD-10-CM | POA: Diagnosis present

## 2016-07-19 DIAGNOSIS — J9690 Respiratory failure, unspecified, unspecified whether with hypoxia or hypercapnia: Secondary | ICD-10-CM

## 2016-07-19 DIAGNOSIS — N390 Urinary tract infection, site not specified: Secondary | ICD-10-CM | POA: Diagnosis present

## 2016-07-19 DIAGNOSIS — I330 Acute and subacute infective endocarditis: Secondary | ICD-10-CM | POA: Diagnosis present

## 2016-07-19 DIAGNOSIS — Z4682 Encounter for fitting and adjustment of non-vascular catheter: Secondary | ICD-10-CM

## 2016-07-19 DIAGNOSIS — R000 Tachycardia, unspecified: Secondary | ICD-10-CM | POA: Insufficient documentation

## 2016-07-19 DIAGNOSIS — L03119 Cellulitis of unspecified part of limb: Secondary | ICD-10-CM

## 2016-07-19 DIAGNOSIS — J9600 Acute respiratory failure, unspecified whether with hypoxia or hypercapnia: Secondary | ICD-10-CM

## 2016-07-19 DIAGNOSIS — A4101 Sepsis due to Methicillin susceptible Staphylococcus aureus: Secondary | ICD-10-CM | POA: Diagnosis present

## 2016-07-19 DIAGNOSIS — B965 Pseudomonas (aeruginosa) (mallei) (pseudomallei) as the cause of diseases classified elsewhere: Secondary | ICD-10-CM | POA: Diagnosis present

## 2016-07-19 DIAGNOSIS — Z681 Body mass index (BMI) 19 or less, adult: Secondary | ICD-10-CM | POA: Diagnosis not present

## 2016-07-19 DIAGNOSIS — D689 Coagulation defect, unspecified: Secondary | ICD-10-CM | POA: Diagnosis not present

## 2016-07-19 DIAGNOSIS — J181 Lobar pneumonia, unspecified organism: Secondary | ICD-10-CM

## 2016-07-19 LAB — URINALYSIS, ROUTINE W REFLEX MICROSCOPIC
Glucose, UA: NEGATIVE mg/dL
Ketones, ur: NEGATIVE mg/dL
Leukocytes, UA: NEGATIVE
NITRITE: NEGATIVE
PROTEIN: 100 mg/dL — AB
Specific Gravity, Urine: 1.018 (ref 1.005–1.030)
pH: 5 (ref 5.0–8.0)

## 2016-07-19 LAB — BLOOD CULTURE ID PANEL (REFLEXED)
ACINETOBACTER BAUMANNII: NOT DETECTED
CANDIDA ALBICANS: NOT DETECTED
CARBAPENEM RESISTANCE: NOT DETECTED
Candida glabrata: NOT DETECTED
Candida krusei: NOT DETECTED
Candida parapsilosis: NOT DETECTED
Candida tropicalis: NOT DETECTED
ENTEROBACTER CLOACAE COMPLEX: NOT DETECTED
ENTEROBACTERIACEAE SPECIES: NOT DETECTED
ENTEROCOCCUS SPECIES: NOT DETECTED
Escherichia coli: NOT DETECTED
HAEMOPHILUS INFLUENZAE: NOT DETECTED
Klebsiella oxytoca: NOT DETECTED
Klebsiella pneumoniae: NOT DETECTED
LISTERIA MONOCYTOGENES: NOT DETECTED
METHICILLIN RESISTANCE: NOT DETECTED
NEISSERIA MENINGITIDIS: NOT DETECTED
PSEUDOMONAS AERUGINOSA: DETECTED — AB
Proteus species: NOT DETECTED
STAPHYLOCOCCUS AUREUS BCID: DETECTED — AB
STREPTOCOCCUS PNEUMONIAE: NOT DETECTED
STREPTOCOCCUS PYOGENES: NOT DETECTED
STREPTOCOCCUS SPECIES: NOT DETECTED
Serratia marcescens: NOT DETECTED
Staphylococcus species: DETECTED — AB
Streptococcus agalactiae: NOT DETECTED

## 2016-07-19 LAB — COMPREHENSIVE METABOLIC PANEL
ALT: 25 U/L (ref 17–63)
ALT: 27 U/L (ref 17–63)
ANION GAP: 8 (ref 5–15)
AST: 47 U/L — ABNORMAL HIGH (ref 15–41)
AST: 51 U/L — AB (ref 15–41)
Albumin: 1.8 g/dL — ABNORMAL LOW (ref 3.5–5.0)
Albumin: 2.4 g/dL — ABNORMAL LOW (ref 3.5–5.0)
Alkaline Phosphatase: 206 U/L — ABNORMAL HIGH (ref 38–126)
Alkaline Phosphatase: 207 U/L — ABNORMAL HIGH (ref 38–126)
Anion gap: 8 (ref 5–15)
BUN: 24 mg/dL — ABNORMAL HIGH (ref 6–20)
BUN: 26 mg/dL — ABNORMAL HIGH (ref 6–20)
CHLORIDE: 100 mmol/L — AB (ref 101–111)
CHLORIDE: 89 mmol/L — AB (ref 101–111)
CO2: 21 mmol/L — AB (ref 22–32)
CO2: 26 mmol/L (ref 22–32)
CREATININE: 2.02 mg/dL — AB (ref 0.61–1.24)
CREATININE: 3.17 mg/dL — AB (ref 0.61–1.24)
Calcium: 6.8 mg/dL — ABNORMAL LOW (ref 8.9–10.3)
Calcium: 8.1 mg/dL — ABNORMAL LOW (ref 8.9–10.3)
GFR calc non Af Amer: 38 mL/min — ABNORMAL LOW (ref >60)
GFR, EST AFRICAN AMERICAN: 26 mL/min — AB (ref >60)
GFR, EST AFRICAN AMERICAN: 44 mL/min — AB (ref >60)
GFR, EST NON AFRICAN AMERICAN: 22 mL/min — AB (ref >60)
Glucose, Bld: 160 mg/dL — ABNORMAL HIGH (ref 65–99)
Glucose, Bld: 98 mg/dL (ref 65–99)
POTASSIUM: 4.1 mmol/L (ref 3.5–5.1)
Potassium: 3.2 mmol/L — ABNORMAL LOW (ref 3.5–5.1)
SODIUM: 123 mmol/L — AB (ref 135–145)
SODIUM: 129 mmol/L — AB (ref 135–145)
Total Bilirubin: 1.4 mg/dL — ABNORMAL HIGH (ref 0.3–1.2)
Total Bilirubin: 1.6 mg/dL — ABNORMAL HIGH (ref 0.3–1.2)
Total Protein: 5.6 g/dL — ABNORMAL LOW (ref 6.5–8.1)
Total Protein: 6.6 g/dL (ref 6.5–8.1)

## 2016-07-19 LAB — CBC
HEMATOCRIT: 32.3 % — AB (ref 39.0–52.0)
Hemoglobin: 11 g/dL — ABNORMAL LOW (ref 13.0–17.0)
MCH: 28.2 pg (ref 26.0–34.0)
MCHC: 34.1 g/dL (ref 30.0–36.0)
MCV: 82.8 fL (ref 78.0–100.0)
PLATELETS: 101 10*3/uL — AB (ref 150–400)
RBC: 3.9 MIL/uL — AB (ref 4.22–5.81)
RDW: 14.5 % (ref 11.5–15.5)
WBC: 9.6 10*3/uL (ref 4.0–10.5)

## 2016-07-19 LAB — CBC WITH DIFFERENTIAL/PLATELET
Basophils Absolute: 0 10*3/uL (ref 0.0–0.1)
Basophils Relative: 0 %
EOS ABS: 0.1 10*3/uL (ref 0.0–0.7)
Eosinophils Relative: 1 %
HCT: 35.7 % — ABNORMAL LOW (ref 39.0–52.0)
HEMOGLOBIN: 12.1 g/dL — AB (ref 13.0–17.0)
LYMPHS PCT: 15 %
Lymphs Abs: 1.9 10*3/uL (ref 0.7–4.0)
MCH: 28.5 pg (ref 26.0–34.0)
MCHC: 33.9 g/dL (ref 30.0–36.0)
MCV: 84 fL (ref 78.0–100.0)
MONOS PCT: 9 %
Monocytes Absolute: 1.1 10*3/uL — ABNORMAL HIGH (ref 0.1–1.0)
NEUTROS ABS: 9.5 10*3/uL — AB (ref 1.7–7.7)
NEUTROS PCT: 75 %
Platelets: 112 10*3/uL — ABNORMAL LOW (ref 150–400)
RBC: 4.25 MIL/uL (ref 4.22–5.81)
RDW: 14.7 % (ref 11.5–15.5)
WBC: 12.6 10*3/uL — AB (ref 4.0–10.5)

## 2016-07-19 LAB — TROPONIN I
Troponin I: <0.03 ng/mL (ref <0.03)
Troponin I: 0.06 ng/mL (ref <0.03)
Troponin I: <0.03 ng/mL (ref <0.03)

## 2016-07-19 LAB — PROTIME-INR
INR: 1.41
PROTHROMBIN TIME: 17.4 s — AB (ref 11.4–15.2)

## 2016-07-19 LAB — I-STAT CG4 LACTIC ACID, ED
LACTIC ACID, VENOUS: 2.38 mmol/L — AB (ref 0.5–1.9)
LACTIC ACID, VENOUS: 2.63 mmol/L — AB (ref 0.5–1.9)

## 2016-07-19 LAB — RESPIRATORY PANEL BY PCR
ADENOVIRUS-RVPPCR: NOT DETECTED
Bordetella pertussis: NOT DETECTED
CHLAMYDOPHILA PNEUMONIAE-RVPPCR: NOT DETECTED
CORONAVIRUS HKU1-RVPPCR: NOT DETECTED
CORONAVIRUS NL63-RVPPCR: NOT DETECTED
CORONAVIRUS OC43-RVPPCR: NOT DETECTED
Coronavirus 229E: NOT DETECTED
INFLUENZA A-RVPPCR: NOT DETECTED
Influenza B: NOT DETECTED
METAPNEUMOVIRUS-RVPPCR: NOT DETECTED
Mycoplasma pneumoniae: NOT DETECTED
PARAINFLUENZA VIRUS 2-RVPPCR: NOT DETECTED
PARAINFLUENZA VIRUS 3-RVPPCR: NOT DETECTED
PARAINFLUENZA VIRUS 4-RVPPCR: NOT DETECTED
Parainfluenza Virus 1: NOT DETECTED
RHINOVIRUS / ENTEROVIRUS - RVPPCR: NOT DETECTED
Respiratory Syncytial Virus: NOT DETECTED

## 2016-07-19 LAB — CSF CELL COUNT WITH DIFFERENTIAL
EOS CSF: 0 % (ref 0–1)
EOS CSF: 0 % (ref 0–1)
LYMPHS CSF: 29 % — AB (ref 40–80)
Lymphs, CSF: 31 % — ABNORMAL LOW (ref 40–80)
MONOCYTE-MACROPHAGE-SPINAL FLUID: 11 % — AB (ref 15–45)
MONOCYTE-MACROPHAGE-SPINAL FLUID: 9 % — AB (ref 15–45)
RBC Count, CSF: 36 /mm3 — ABNORMAL HIGH
RBC Count, CSF: 555 /mm3 — ABNORMAL HIGH
Segmented Neutrophils-CSF: 58 % — ABNORMAL HIGH (ref 0–6)
Segmented Neutrophils-CSF: 62 % — ABNORMAL HIGH (ref 0–6)
TUBE #: 1
Tube #: 4
WBC CSF: 180 /mm3 — AB (ref 0–5)
WBC, CSF: 51 /mm3 (ref 0–5)

## 2016-07-19 LAB — PROTEIN / CREATININE RATIO, URINE
Creatinine, Urine: 252.74 mg/dL
PROTEIN CREATININE RATIO: 0.46 mg/mg{creat} — AB (ref 0.00–0.15)
Total Protein, Urine: 115 mg/dL

## 2016-07-19 LAB — PATHOLOGIST SMEAR REVIEW

## 2016-07-19 LAB — OSMOLALITY, URINE: OSMOLALITY UR: 287 mosm/kg — AB (ref 300–900)

## 2016-07-19 LAB — TSH: TSH: 0.208 u[IU]/mL — AB (ref 0.350–4.500)

## 2016-07-19 LAB — GLUCOSE, CSF: Glucose, CSF: 52 mg/dL (ref 40–70)

## 2016-07-19 LAB — OSMOLALITY: OSMOLALITY: 273 mosm/kg — AB (ref 275–295)

## 2016-07-19 LAB — CORTISOL: CORTISOL PLASMA: 11.7 ug/dL

## 2016-07-19 LAB — SEDIMENTATION RATE: Sed Rate: 24 mm/hr — ABNORMAL HIGH (ref 0–16)

## 2016-07-19 LAB — HIV ANTIBODY (ROUTINE TESTING W REFLEX): HIV SCREEN 4TH GENERATION: NONREACTIVE

## 2016-07-19 LAB — PROTEIN, CSF: Total  Protein, CSF: 88 mg/dL — ABNORMAL HIGH (ref 15–45)

## 2016-07-19 LAB — MRSA PCR SCREENING: MRSA by PCR: NEGATIVE

## 2016-07-19 LAB — SODIUM, URINE, RANDOM: SODIUM UR: 15 mmol/L

## 2016-07-19 MED ORDER — SODIUM CHLORIDE 0.9% FLUSH
3.0000 mL | Freq: Two times a day (BID) | INTRAVENOUS | Status: DC
  Administered 2016-07-19 – 2016-07-27 (×15): 3 mL via INTRAVENOUS
  Administered 2016-07-28: 5 mL via INTRAVENOUS
  Administered 2016-07-29 – 2016-09-02 (×41): 3 mL via INTRAVENOUS

## 2016-07-19 MED ORDER — VANCOMYCIN HCL 500 MG IV SOLR
500.0000 mg | Freq: Two times a day (BID) | INTRAVENOUS | Status: DC
  Administered 2016-07-19: 500 mg via INTRAVENOUS
  Filled 2016-07-19 (×2): qty 500

## 2016-07-19 MED ORDER — NAFCILLIN SODIUM 2 G IJ SOLR: 2.0000 g | INTRAMUSCULAR | Status: DC

## 2016-07-19 MED ORDER — PRO-STAT SUGAR FREE PO LIQD
30.0000 mL | Freq: Two times a day (BID) | ORAL | Status: DC
  Administered 2016-07-19 – 2016-07-28 (×3): 30 mL via ORAL
  Filled 2016-07-19 (×18): qty 30

## 2016-07-19 MED ORDER — SODIUM CHLORIDE 0.9 % IV SOLN
INTRAVENOUS | Status: AC
  Administered 2016-07-19 – 2016-07-20 (×3): via INTRAVENOUS

## 2016-07-19 MED ORDER — DEXTROSE 5 % IV SOLN
2.0000 g | INTRAVENOUS | Status: DC
  Filled 2016-07-19 (×3): qty 2

## 2016-07-19 MED ORDER — PIPERACILLIN-TAZOBACTAM 3.375 G IVPB
3.3750 g | Freq: Three times a day (TID) | INTRAVENOUS | Status: DC
  Administered 2016-07-19: 3.375 g via INTRAVENOUS
  Filled 2016-07-19 (×3): qty 50

## 2016-07-19 MED ORDER — NAFCILLIN SODIUM 2 G IJ SOLR
2.0000 g | INTRAVENOUS | Status: DC
  Administered 2016-07-19 – 2016-07-22 (×16): 2 g via INTRAVENOUS
  Filled 2016-07-19 (×19): qty 2000

## 2016-07-19 MED ORDER — PIPERACILLIN-TAZOBACTAM 3.375 G IVPB 30 MIN
3.3750 g | Freq: Once | INTRAVENOUS | Status: AC
  Administered 2016-07-19: 3.375 g via INTRAVENOUS
  Filled 2016-07-19: qty 50

## 2016-07-19 MED ORDER — ACETAMINOPHEN 650 MG RE SUPP: 650.0000 mg | Freq: Four times a day (QID) | RECTAL | Status: DC | PRN

## 2016-07-19 MED ORDER — BUPRENORPHINE HCL-NALOXONE HCL 8-2 MG SL SUBL
1.0000 | SUBLINGUAL_TABLET | Freq: Two times a day (BID) | SUBLINGUAL | Status: DC
  Administered 2016-07-19 (×2): 1 via SUBLINGUAL
  Filled 2016-07-19 (×2): qty 1

## 2016-07-19 MED ORDER — ACETAMINOPHEN 325 MG PO TABS
650.0000 mg | ORAL_TABLET | Freq: Four times a day (QID) | ORAL | Status: DC | PRN
  Administered 2016-07-19 – 2016-07-21 (×3): 650 mg via ORAL
  Filled 2016-07-19 (×3): qty 2

## 2016-07-19 MED ORDER — SODIUM CHLORIDE 0.9 % IV BOLUS (SEPSIS)
1000.0000 mL | Freq: Once | INTRAVENOUS | Status: AC
  Administered 2016-07-19: 1000 mL via INTRAVENOUS

## 2016-07-19 MED ORDER — PNEUMOCOCCAL VAC POLYVALENT 25 MCG/0.5ML IJ INJ
0.5000 mL | INJECTION | INTRAMUSCULAR | Status: AC
  Administered 2016-07-23: 0.5 mL via INTRAMUSCULAR
  Filled 2016-07-19: qty 0.5

## 2016-07-19 MED ORDER — POTASSIUM CHLORIDE CRYS ER 20 MEQ PO TBCR
40.0000 meq | EXTENDED_RELEASE_TABLET | Freq: Once | ORAL | Status: AC
Start: 2016-07-19 — End: 2016-07-19
  Administered 2016-07-19: 40 meq via ORAL
  Filled 2016-07-19: qty 2

## 2016-07-19 MED ORDER — VANCOMYCIN HCL IN DEXTROSE 750-5 MG/150ML-% IV SOLN: 750.0000 mg | INTRAVENOUS | Status: DC

## 2016-07-19 MED ORDER — CEFTRIAXONE SODIUM 1 G IJ SOLR: 1.0000 g | Freq: Once | INTRAMUSCULAR | Status: DC

## 2016-07-19 MED ORDER — GUAIFENESIN ER 600 MG PO TB12
600.0000 mg | ORAL_TABLET | Freq: Two times a day (BID) | ORAL | Status: DC
  Administered 2016-07-19 – 2016-07-21 (×5): 600 mg via ORAL
  Filled 2016-07-19 (×12): qty 1

## 2016-07-19 MED ORDER — VANCOMYCIN HCL IN DEXTROSE 1-5 GM/200ML-% IV SOLN
1000.0000 mg | Freq: Once | INTRAVENOUS | Status: AC
  Administered 2016-07-19: 1000 mg via INTRAVENOUS
  Filled 2016-07-19: qty 200

## 2016-07-19 MED ORDER — DEXTROSE 5 % IV SOLN
2.0000 g | Freq: Once | INTRAVENOUS | Status: AC
  Administered 2016-07-19: 2 g via INTRAVENOUS
  Filled 2016-07-19: qty 2

## 2016-07-19 MED ORDER — DEXTROSE 5 % IV SOLN
2.0000 g | INTRAVENOUS | Status: DC
  Administered 2016-07-19: 2 g via INTRAVENOUS
  Filled 2016-07-19: qty 2

## 2016-07-19 MED ORDER — SODIUM CHLORIDE 0.9 % IV BOLUS (SEPSIS)
500.0000 mL | Freq: Once | INTRAVENOUS | Status: AC
  Administered 2016-07-19: 500 mL via INTRAVENOUS

## 2016-07-19 MED ORDER — LIDOCAINE HCL 2 % IJ SOLN
20.0000 mL | Freq: Once | INTRAMUSCULAR | Status: AC
  Administered 2016-07-19: 400 mg
  Filled 2016-07-19: qty 20

## 2016-07-19 MED ORDER — HEPARIN SODIUM (PORCINE) 5000 UNIT/ML IJ SOLN
5000.0000 [IU] | Freq: Three times a day (TID) | INTRAMUSCULAR | Status: DC
  Administered 2016-07-19 – 2016-07-30 (×29): 5000 [IU] via SUBCUTANEOUS
  Filled 2016-07-19 (×33): qty 1

## 2016-07-19 MED ORDER — MORPHINE SULFATE (PF) 4 MG/ML IV SOLN
4.0000 mg | Freq: Once | INTRAVENOUS | Status: AC
  Administered 2016-07-19: 4 mg via INTRAVENOUS
  Filled 2016-07-19: qty 1

## 2016-07-19 NOTE — Evaluation (Signed)
Clinical/Bedside Swallow Evaluation Patient Details  Name: Johnathan MaduroChristopher Group MRN: [id number] Date of Birth: [birth year]  Today's Date: 07/19/2016 Time: SLP Start Time (ACUTE ONLY): 1454 SLP Stop Time (ACUTE ONLY): 1504 SLP Time Calculation (min) (ACUTE ONLY): 10 min  Past Medical History:  Past Medical History:  Diagnosis Date  . Substance abuse    Past Surgical History:  Past Surgical History:  Procedure Laterality Date  . Incision and drainage of bilateral forearm abscesses Bilateral   . KNEE ARTHROSCOPY     HPI:  Johnathan [name] a 46 y.o.male admitted with sepsis secondary to UTI vs cellulitis and ARF and hyponatremia(note LP pending due to ? Neck stiffness). PMH:w Hep C?, IVDA, (recently injected opana) apparently c/o fever intermittently for the past 1 week, and redness over the dorsum of the right foot and slight swelling. Per chart pt notes injecting Opana about 2 days ago. CXR emphysema. Streaky lower lobe opacities favoring atelectasis. Right lung base slightly more patchy which may reflect aspiration or pneumonia. Possible small right pleural effusion.   Assessment / Plan / Recommendation Clinical Impression  Pt with shallow respiratory pattern due to pain from pna. Recommended incentive spirometer, RN in agreement and ordered. No indications of airway compromise, no complaints of prior dysphagia. Risk appears low; not intubated. Recommend continue regular/thin, straws allowed and pills with thin. No further ST needed.  SLP Visit Diagnosis: Dysphagia, unspecified (R13.10)    Aspiration Risk  Mild aspiration risk    Diet Recommendation Regular;Thin liquid   Liquid Administration via: Straw;Cup Medication Administration: Whole meds with liquid Supervision: Patient able to self feed Compensations: Slow rate;Small sips/bites Postural Changes: Seated upright at 90 degrees    Other  Recommendations Oral Care Recommendations: Oral care BID   Follow up  Recommendations None      Frequency and Duration            Prognosis        Swallow Study   General HPI: Johnathan [name] a 46 y.o.male admitted with sepsis secondary to UTI vs cellulitis and ARF and hyponatremia(note LP pending due to ? Neck stiffness). PMH:w Hep C?, IVDA, (recently injected opana) apparently c/o fever intermittently for the past 1 week, and redness over the dorsum of the right foot and slight swelling. Per chart pt notes injecting Opana about 2 days ago. CXR emphysema. Streaky lower lobe opacities favoring atelectasis. Right lung base slightly more patchy which may reflect aspiration or pneumonia. Possible small right pleural effusion. Type of Study: Bedside Swallow Evaluation Previous Swallow Assessment:  (none) Diet Prior to this Study: Regular;Thin liquids Temperature Spikes Noted: No Respiratory Status: Nasal cannula History of Recent Intubation: No Behavior/Cognition: Alert;Cooperative;Pleasant mood Oral Cavity Assessment: Within Functional Limits Oral Care Completed by SLP: No Oral Cavity - Dentition:  (upper partial, 4 lower teeth) Vision: Functional for self-feeding Self-Feeding Abilities: Able to feed self Patient Positioning: Upright in bed Baseline Vocal Quality: Normal Volitional Cough:  (lots pain d/t pna, strong throat clear) Volitional Swallow: Able to elicit    Oral/Motor/Sensory Function Overall Oral Motor/Sensory Function: Within functional limits   Ice Chips Ice chips: Not tested   Thin Liquid Thin Liquid: Within functional limits Presentation: Straw    Nectar Thick Nectar Thick Liquid: Not tested   Honey Thick Honey Thick Liquid: Not tested   Puree Puree: Not tested   Solid   GO   Solid: Within functional limits        Johnathan [name], Johnathan [name] 07/19/2016,2:16 PM   Johnathan [name] M.Ed Personnel officerCCC-SLP Pager  [phone]       

## 2016-07-19 NOTE — ED Notes (Signed)
Dr. Wilkie AyeHorton aware of lactic 2.63

## 2016-07-19 NOTE — ED Notes (Signed)
Attempted report x1.  Name and callback number provided.   

## 2016-07-19 NOTE — ED Notes (Signed)
Assisted Horton MD with lumbar puncture, consent placed in medical records by this RN. Walked specimens to lab.

## 2016-07-19 NOTE — Progress Notes (Signed)
Pharmacy Antibiotic Note  Johnathan MaduroChristopher Carbin is a 46 y.o. male admitted on 07/19/2016 with MSSA and pseudomonas bacteremia.   Pt was started on Vancomycin and Zosyn this morning.  Based on rapid ID for blood cx will change abx to Cefepime.    Noted AKI. Rx will follow and adjust dose as indicated.  Plan: D/C Vancomycin and Zosyn (discussed with Dr. Catha GosselinMikhail) Cefepime 2gm IV q24h Follow-up cx / sensitivities and renal function.  Height: 5\' 11"  (180.3 cm) Weight: 152 lb (68.9 kg) IBW/kg (Calculated) : 75.3  Temp (24hrs), Avg:98.6 F (37 C), Min:97.9 F (36.6 C), Max:99.6 F (37.6 C)   Recent Labs Lab 07/18/16 2351 07/19/16 0010 07/19/16 0207 07/19/16 0548  WBC 12.6*  --   --  9.6  CREATININE 3.17*  --   --  2.02*  LATICACIDVEN  --  2.63* 2.38*  --     Estimated Creatinine Clearance: 45 mL/min (A) (by C-G formula based on SCr of 2.02 mg/dL (H)).    No Known Allergies  Antimicrobials this admission: Vanc 7/13 >> 7/13 Zosyn 7/ 13 >> 7/13 Cefepime 7/13 >>   Dose adjustments this admission:  Microbiology results: 7/13 BCx >> 2/2 GPC in clustes         BCID >> MSSA and pseudomonas 7/13 CSF cx >> pending 7/13 RVP >> negative 7/13 MRSA PCR negative  Thank you for allowing pharmacy to be a part of this patient's care.  Toys 'R' UsKimberly Jheri Mitter, Pharm.D., BCPS Clinical Pharmacist 07/19/2016 4:51 PM

## 2016-07-19 NOTE — Consult Note (Addendum)
[facility]    Date of Admission:  07/19/2016           Day 2 antibiotics       Reason for Consult: Automatic consultation for MSSA and Pseudomonas bacteremia     Assessment: He has MSSA and Pseudomonas bacteremia complicated by right lower lobe pneumonia and meningitis. I suspect that he has endocarditis and may have had some septic CNS emboli. If his acute kidney injury improves I would like to get a CT scan of his head looking for evidence of embolic events and abscess. For now I will treat him with IV nafcillin and cefepime. I will order repeat blood cultures and a transthoracic echocardiogram.   Plan: 1. Start IV nafcillin 2. Continue cefepime 3. Repeat blood cultures in a.m. 4. Transthoracic echocardiogram 5. Consider contrasted head CT if his renal function improves   Principal Problem:   MSSA bacteremia Active Problems:   Sepsis (HCC)   Meningitis   Pneumonia   AKI (acute kidney injury) (HCC)   IVDU (intravenous drug user)   UTI (urinary tract infection)   ARF (acute renal failure) (HCC)   Hyponatremia   Abnormal liver function   . buprenorphine-naloxone  1 tablet Sublingual BID  . feeding supplement (PRO-STAT SUGAR FREE 64)  30 mL Oral BID  . heparin  5,000 Units Subcutaneous Q8H  . [START ON 07/20/2016] pneumococcal 23 valent vaccine  0.5 mL Intramuscular Tomorrow-1000  . sodium chloride flush  3 mL Intravenous Q12H    HPI: Johnathan [name] is a 46 y.o. male who states that someone stepped on his right foot one week ago and it started to swell, turn red and become painful. He began to have fever, chills, headache, chest pain and shortness of breath leading to admission yesterday.  He has a history of injecting drug use and polysubstance abuse. On admission he admitted to injecting crushed Opana recently.  Admission blood cultures are growing MSSA in both sets. Pseudomonas is growing in one set. He has right basilar  pneumonia. An LP was performed because of neck stiffness and showed 51 white blood cells with 62% segmented neutrophils. His glucose was normal but the protein was elevated. No organisms were seen on Gram stain and culture is pending.  Review of Systems: Review of Systems  Constitutional: Positive for chills, diaphoresis, fever and malaise/fatigue. Negative for weight loss.  HENT: Negative for sore throat.   Respiratory: Positive for cough and shortness of breath. Negative for sputum production.   Cardiovascular: Positive for chest pain.  Gastrointestinal: Negative for abdominal pain, diarrhea, heartburn, nausea and vomiting.  Genitourinary: Negative for dysuria and frequency.  Musculoskeletal: Positive for back pain and joint pain. Negative for myalgias.  Skin: Positive for rash.  Neurological: Positive for headaches. Negative for dizziness, sensory change and speech change.  Psychiatric/Behavioral: Positive for substance abuse.    Past Medical History:  Diagnosis Date  . Substance abuse     Social History  Substance Use Topics  . Smoking status: Current Every Day Smoker    Packs/day: 0.50    Years: 15.00    Types: Cigarettes  . Smokeless tobacco: Never Used  . Alcohol use No     Comment: seldom once a year     History reviewed. No pertinent family history. No Known Allergies  OBJECTIVE: Blood pressure 98/62, pulse 86, temperature 97.9 F (36.6 C), temperature source Oral, resp. rate 14, height 5\' 11"  (1.803 m), weight  152 lb (68.9 kg), SpO2 100 %.  Physical Exam  Constitutional: He is oriented to person, place, and time.  He is sitting up in bed. Several family members are present. He is thin and somewhat disheveled. He is clutching his chest with his arms.  HENT:  Poor dentition.  Eyes: Conjunctivae are normal.  Neck: Neck supple.  Cardiovascular: Normal rate and regular rhythm.   No murmur heard. Pulmonary/Chest: Effort normal and breath sounds normal. He has no  wheezes. He has no rales.  Abdominal: Soft. He exhibits no distension. There is no tenderness.  Musculoskeletal:  His knees are stiff and painful with range of motion but show no redness or swelling. He underwent arthroscopic surgery on his right knee about one month ago.  Neurological: He is alert and oriented to person, place, and time.  Skin:  He has diffuse redness over both feet. There are some areas that have the appearance of early vasculitis. He has multiple tattoos.  Psychiatric: Mood and affect normal.    Lab Results Lab Results  Component Value Date   WBC 9.6 07/19/2016   HGB 11.0 (L) 07/19/2016   HCT 32.3 (L) 07/19/2016   MCV 82.8 07/19/2016   PLT 101 (L) 07/19/2016    Lab Results  Component Value Date   CREATININE 2.02 (H) 07/19/2016   BUN 24 (H) 07/19/2016   NA 129 (L) 07/19/2016   K 3.2 (L) 07/19/2016   CL 100 (L) 07/19/2016   CO2 21 (L) 07/19/2016    Lab Results  Component Value Date   ALT 27 07/19/2016   AST 51 (H) 07/19/2016   ALKPHOS 207 (H) 07/19/2016   BILITOT 1.4 (H) 07/19/2016     Microbiology: Recent Results (from the past 240 hour(s))  Culture, blood (Routine x 2)     Status: None (Preliminary result)   Collection Time: 07/18/16 10:55 PM  Result Value Ref Range Status   Specimen Description BLOOD RIGHT ARM  Final   Special Requests IN PEDIATRIC BOTTLE Blood Culture adequate volume  Final   Culture  Setup Time   Final    GRAM POSITIVE COCCI IN CLUSTERS IN PEDIATRIC BOTTLE CRITICAL RESULT CALLED TO, READ BACK BY AND VERIFIED WITH: K HAMMONS,PHARMD AT 1619 07/19/16 BY L BENFIELD    Culture GRAM POSITIVE COCCI  Final   Report Status PENDING  Incomplete  Blood Culture ID Panel (Reflexed)     Status: Abnormal   Collection Time: 07/18/16 10:55 PM  Result Value Ref Range Status   Enterococcus species NOT DETECTED NOT DETECTED Final   Listeria monocytogenes NOT DETECTED NOT DETECTED Final   Staphylococcus species DETECTED (A) NOT DETECTED Final     Comment: CRITICAL RESULT CALLED TO, READ BACK BY AND VERIFIED WITH: K HAMMONS, PHARMD AT 1619 07/19/16 BY L BENFIELD    Staphylococcus aureus DETECTED (A) NOT DETECTED Final    Comment: Methicillin (oxacillin) susceptible Staphylococcus aureus (MSSA). Preferred therapy is anti staphylococcal beta lactam antibiotic (Cefazolin or Nafcillin), unless clinically contraindicated. CRITICAL RESULT CALLED TO, READ BACK BY AND VERIFIED WITH: K HAMMONS,PHARMD AT 1619 07/19/16 BY L BENFIELD    Methicillin resistance NOT DETECTED NOT DETECTED Final   Streptococcus species NOT DETECTED NOT DETECTED Final   Streptococcus agalactiae NOT DETECTED NOT DETECTED Final   Streptococcus pneumoniae NOT DETECTED NOT DETECTED Final   Streptococcus pyogenes NOT DETECTED NOT DETECTED Final   Acinetobacter baumannii NOT DETECTED NOT DETECTED Final   Enterobacteriaceae species NOT DETECTED NOT DETECTED Final   Enterobacter cloacae  complex NOT DETECTED NOT DETECTED Final   Escherichia coli NOT DETECTED NOT DETECTED Final   Klebsiella oxytoca NOT DETECTED NOT DETECTED Final   Klebsiella pneumoniae NOT DETECTED NOT DETECTED Final   Proteus species NOT DETECTED NOT DETECTED Final   Serratia marcescens NOT DETECTED NOT DETECTED Final   Carbapenem resistance NOT DETECTED NOT DETECTED Final   Haemophilus influenzae NOT DETECTED NOT DETECTED Final   Neisseria meningitidis NOT DETECTED NOT DETECTED Final   Pseudomonas aeruginosa DETECTED (A) NOT DETECTED Final    Comment: CRITICAL RESULT CALLED TO, READ BACK BY AND VERIFIED WITH: K HAMMONS,PHARMD AT 1619 07/19/16 BY L BENFIELD    Candida albicans NOT DETECTED NOT DETECTED Final   Candida glabrata NOT DETECTED NOT DETECTED Final   Candida krusei NOT DETECTED NOT DETECTED Final   Candida parapsilosis NOT DETECTED NOT DETECTED Final   Candida tropicalis NOT DETECTED NOT DETECTED Final  Culture, blood (Routine x 2)     Status: None (Preliminary result)   Collection Time:  07/18/16 11:45 PM  Result Value Ref Range Status   Specimen Description BLOOD LEFT ARM  Final   Special Requests   Final    BOTTLES DRAWN AEROBIC AND ANAEROBIC Blood Culture adequate volume   Culture  Setup Time   Final    GRAM POSITIVE COCCI IN CLUSTERS IN BOTH AEROBIC AND ANAEROBIC BOTTLES CRITICAL VALUE NOTED.  VALUE IS CONSISTENT WITH PREVIOUSLY REPORTED AND CALLED VALUE.    Culture GRAM POSITIVE COCCI IN CLUSTERS  Final   Report Status PENDING  Incomplete  CSF culture     Status: None (Preliminary result)   Collection Time: 07/19/16  4:07 AM  Result Value Ref Range Status   Specimen Description CSF  Final   Special Requests NONE  Final   Gram Stain   Final    CYTOSPIN SMEAR WBC PRESENT, PREDOMINANTLY PMN NO ORGANISMS SEEN    Culture PENDING  Incomplete   Report Status PENDING  Incomplete  MRSA PCR Screening     Status: None   Collection Time: 07/19/16  5:26 AM  Result Value Ref Range Status   MRSA by PCR NEGATIVE NEGATIVE Final    Comment:        The GeneXpert MRSA Assay (FDA approved for NASAL specimens only), is one component of a comprehensive MRSA colonization surveillance program. It is not intended to diagnose MRSA infection nor to guide or monitor treatment for MRSA infections.   Respiratory Panel by PCR     Status: None   Collection Time: 07/19/16 12:19 PM  Result Value Ref Range Status   Adenovirus NOT DETECTED NOT DETECTED Final   Coronavirus 229E NOT DETECTED NOT DETECTED Final   Coronavirus HKU1 NOT DETECTED NOT DETECTED Final   Coronavirus NL63 NOT DETECTED NOT DETECTED Final   Coronavirus OC43 NOT DETECTED NOT DETECTED Final   Metapneumovirus NOT DETECTED NOT DETECTED Final   Rhinovirus / Enterovirus NOT DETECTED NOT DETECTED Final   Influenza A NOT DETECTED NOT DETECTED Final   Influenza B NOT DETECTED NOT DETECTED Final   Parainfluenza Virus 1 NOT DETECTED NOT DETECTED Final   Parainfluenza Virus 2 NOT DETECTED NOT DETECTED Final    Parainfluenza Virus 3 NOT DETECTED NOT DETECTED Final   Parainfluenza Virus 4 NOT DETECTED NOT DETECTED Final   Respiratory Syncytial Virus NOT DETECTED NOT DETECTED Final   Bordetella pertussis NOT DETECTED NOT DETECTED Final   Chlamydophila pneumoniae NOT DETECTED NOT DETECTED Final   Mycoplasma pneumoniae NOT DETECTED NOT DETECTED Final  Cliffton Asters, MD [facility] [phone] pager   [phone] cell 07/19/2016, 7:00 PM

## 2016-07-19 NOTE — ED Notes (Signed)
EDP at bedside performing lumbar puncture.  

## 2016-07-19 NOTE — H&P (Signed)
TRH H&P   Patient Demographics:    Johnathan [name], is a 46 y.o. male  MRN: [id number]   DOB - 21-Oct-1970  Admit Date - 07/19/2016  Outpatient Primary MD for the patient is [name], MD  Referring MD/NP/PA:   Thayer Jew  Outpatient Specialists:   Patient coming from: home  Chief Complaint  Patient presents with  . Cellulitis      HPI:    Johnathan [name]  is a 46 y.o. male, w Hep C?, IVDA, (recently injected opana) apparently c/o fever intermittently for the past 1 week, and redness over the dorsum of the right foot and slight swelling.  Pt notes injecting Opana about 2 days ago.  Pt denies any recent dental work. Pt presented to ED due to subjective fevers.    In ED,  Pt found to have mild cellulitis of the right > left foot as well as uti wbc 6-30.  Pt noted to be in ARF bun/creat 26/3.17,  And to have abnormal LFT, Ast 47, Alt 25, Alk phos 206, T. Bili 1.6, Alb 2.4.  And hyponatremia  Na 123  , CXR right lung base slightly more patchy may reflect aspiration or pneumonia.  Possible small right pleural effusion   Pt will be admitted for sepsis secondary to uti vs cellulitis and ARF and hyponatremia.  (note LP pending due to ? Neck stiffness)    Review of systems:    In addition to the HPI above,   No Headache, No changes with Vision or hearing, No problems swallowing food or Liquids, No Chest pain, Cough or Shortness of Breath, No Abdominal pain, No Nausea or Vommitting, Bowel movements are regular, No Blood in stool or Urine, No dysuria, No new skin rashes or bruises, No new joints pains-aches,  No new weakness, tingling, numbness in any extremity, No recent weight gain or loss, No polyuria, polydypsia or polyphagia, No significant Mental Stressors.  A full 10 point Review of Systems was done, except as stated above, all other Review of Systems  were negative.   With Past History of the following :    Past Medical History:  Diagnosis Date  . Substance abuse       Past Surgical History:  Procedure Laterality Date  . Incision and drainage of bilateral forearm abscesses Bilateral   . KNEE ARTHROSCOPY        Social History:     Social History  Substance Use Topics  . Smoking status: Current Every Day Smoker    Packs/day: 0.50    Years: 15.00    Types: Cigarettes  . Smokeless tobacco: Never Used  . Alcohol use No     Comment: seldom once a year      Lives - at home  Mobility -   Walks by self at baseline   Family History :    History reviewed. No  pertinent family history. Pt states no family hx of any medical conditions   Home Medications:   Prior to Admission medications   Medication Sig Start Date End Date Taking? Authorizing Provider  SUBOXONE 8-2 MG FILM Take 2 Film by mouth daily. 07/02/16  Yes [provider]  amoxicillin-clavulanate (AUGMENTIN) 875-125 MG per tablet Take 1 tablet by mouth 2 (two) times daily. Patient not taking: Reported on 07/19/2016 09/16/14   Debbe Odea, MD  cyclobenzaprine (FLEXERIL) 10 MG tablet Take 1 tablet (10 mg total) by mouth 2 (two) times daily as needed for muscle spasms. Patient not taking: Reported on 09/12/2014 06/22/14   Charlann Lange, PA-C  doxycycline (VIBRAMYCIN) 100 MG capsule Take 1 capsule (100 mg total) by mouth 2 (two) times daily. Patient not taking: Reported on 07/19/2016 09/16/14   Debbe Odea, MD  ibuprofen (ADVIL,MOTRIN) 800 MG tablet Take 1 tablet (800 mg total) by mouth 3 (three) times daily. Patient not taking: Reported on 09/12/2014 06/22/14   Charlann Lange, PA-C     Allergies:    No Known Allergies   Physical Exam:   Vitals  Blood pressure 134/74, pulse (!) 114, temperature 97.9 F (36.6 C), temperature source Oral, resp. rate (!) 33, weight 68 kg (150 lb), SpO2 94 %.   1. General  lying in bed in NAD,    2. Normal affect and  insight, Not Suicidal or Homicidal, Awake Alert, Oriented X 3.  3. No F.N deficits, ALL C.Nerves Intact, Strength 5/5 all 4 extremities, Sensation intact all 4 extremities, Plantars down going.  4. Ears and Eyes appear Normal, Conjunctivae clear, PERRLA. Moist Oral Mucosa.  5. Supple Neck, No JVD, No cervical lymphadenopathy appriciated, No Carotid Bruits.  6. Symmetrical Chest wall movement, Good air movement bilaterally, slight crackle right lung base, no wheeze  7. RRR, No Gallops, Rubs or Murmurs, No Parasternal Heave.  8. Positive Bowel Sounds, Abdomen Soft, No tenderness, No organomegaly appriciated,No rebound -guarding or rigidity.  9.  No Cyanosis, Normal Skin Turgor, No Skin Rash or Bruise.  10. Good muscle tone,  joints appear normal , no effusions, Normal ROM.  11. No Palpable Lymph Nodes in Neck or Axillae  No janeway, no osler, splinter Slight redness over the dorsum of the right foot. No warmth. , negative Kernig, negative brudzinski   Data Review:    CBC  Recent Labs Lab 07/18/16 2351  WBC 12.6*  HGB 12.1*  HCT 35.7*  PLT 112*  MCV 84.0  MCH 28.5  MCHC 33.9  RDW 14.7  LYMPHSABS 1.9  MONOABS 1.1*  EOSABS 0.1  BASOSABS 0.0   ------------------------------------------------------------------------------------------------------------------  Chemistries   Recent Labs Lab 07/18/16 2351  NA 123*  K 4.1  CL 89*  CO2 26  GLUCOSE 160*  BUN 26*  CREATININE 3.17*  CALCIUM 8.1*  AST 47*  ALT 25  ALKPHOS 206*  BILITOT 1.6*   ------------------------------------------------------------------------------------------------------------------ CrCl cannot be calculated (Unknown ideal weight.). ------------------------------------------------------------------------------------------------------------------ No results for input(s): TSH, T4TOTAL, T3FREE, THYROIDAB in the last 72 hours.  Invalid input(s): FREET3  Coagulation profile  Recent Labs Lab  07/18/16 2351  INR 1.41   ------------------------------------------------------------------------------------------------------------------- No results for input(s): DDIMER in the last 72 hours. -------------------------------------------------------------------------------------------------------------------  Cardiac Enzymes No results for input(s): CKMB, TROPONINI, MYOGLOBIN in the last 168 hours.  Invalid input(s): CK ------------------------------------------------------------------------------------------------------------------ No results found for: BNP   ---------------------------------------------------------------------------------------------------------------  Urinalysis    Component Value Date/Time   COLORURINE AMBER (A) 07/18/2016 2255   APPEARANCEUR CLOUDY (A) 07/18/2016 2255  LABSPEC 1.018 07/18/2016 2255   PHURINE 5.0 07/18/2016 2255   GLUCOSEU NEGATIVE 07/18/2016 2255   HGBUR MODERATE (A) 07/18/2016 2255   BILIRUBINUR SMALL (A) 07/18/2016 [place] 07/18/2016 2255   PROTEINUR 100 (A) 07/18/2016 2255   NITRITE NEGATIVE 07/18/2016 2255   LEUKOCYTESUR NEGATIVE 07/18/2016 2255    ----------------------------------------------------------------------------------------------------------------   Imaging Results:    Dg Chest Port 1 View  Result Date: 07/19/2016 CLINICAL DATA:  Cellulitis. Worsening swelling and redness of both feet. Intermittent fever. Code sepsis. EXAM: PORTABLE CHEST 1 VIEW COMPARISON:  Radiograph 06/05/2016 FINDINGS: Emphysema. Lung volumes from prior exam. Normal heart size and mediastinal contours for technique. There are streaky opacities in both lower lobes, with slightly more patchy right lung base opacity. Small right pleural effusion. No pneumothorax. No acute osseous abnormality. IMPRESSION: Emphysema. Streaky lower lobe opacities favoring atelectasis. Right lung base slightly more patchy which may reflect aspiration or  pneumonia. Possible small right pleural effusion. Electronically Signed   By: Jeb Levering M.D.   On: 07/19/2016 01:53      Assessment & Plan:    Active Problems:   Sepsis ([place])   Fever   UTI (urinary tract infection)   Tachycardia   ARF (acute renal failure) ([facility])   Hyponatremia   Abnormal liver function   Fever secondary to sepsis Sources multiple ddx uti, pneumonia, cellulitis Blood culture x2 Respiratory panel vanco iv pharmacy to dose, cefepime iv pharmacy to dose  Uti  Awaiting urine culture Cont vanco/zosyn  Cellulitis right foot Check esr  Pneumonia RLL vs aspiration Speech evaluation Vanco iv/Zosyn iv pharmacy to dose for ?Hcap   Tachycardia secondary to sepsis Trop I q6h x3 tsh Cardiac echo  ARF Check urine sodium, urine creatinine, urine eosinophils Check renal ultrasound Hydrate with ns iv  Hyponatremia Serum osm, cortisol, tsh, urine sodium, urine osm  Severe protein calorie malnutrition prostat    DVT Prophylaxis Heparin - - SCDs   AM Labs Ordered, also please review Full Orders  Family Communication: Admission, patients condition and plan of care including tests being ordered have been discussed with the patient  who indicate understanding and agree with the plan and Code Status.  Code Status FULL CODE  Likely DC to  home  Condition GUARDED    Consults called: none  Admission status: inpatient  Time spent in minutes : 45 minutes   [name] M.D on 07/19/2016 at 3:34 AM  Between 7pm to 7am - Pager - [phone]. After 7am go to [email or web address] - password [email or web address]  [facility] - Office  [phone]

## 2016-07-19 NOTE — Progress Notes (Signed)
PHARMACY - PHYSICIAN COMMUNICATION CRITICAL VALUE ALERT - BLOOD CULTURE IDENTIFICATION (BCID)  Results for orders placed or performed during the hospital encounter of 07/19/16  Blood Culture ID Panel (Reflexed) (Collected: 07/18/2016 10:55 PM)  Result Value Ref Range   Enterococcus species NOT DETECTED NOT DETECTED   Listeria monocytogenes NOT DETECTED NOT DETECTED   Staphylococcus species DETECTED (A) NOT DETECTED   Staphylococcus aureus DETECTED (A) NOT DETECTED   Methicillin resistance NOT DETECTED NOT DETECTED   Streptococcus species NOT DETECTED NOT DETECTED   Streptococcus agalactiae NOT DETECTED NOT DETECTED   Streptococcus pneumoniae NOT DETECTED NOT DETECTED   Streptococcus pyogenes NOT DETECTED NOT DETECTED   Acinetobacter baumannii NOT DETECTED NOT DETECTED   Enterobacteriaceae species NOT DETECTED NOT DETECTED   Enterobacter cloacae complex NOT DETECTED NOT DETECTED   Escherichia coli NOT DETECTED NOT DETECTED   Klebsiella oxytoca NOT DETECTED NOT DETECTED   Klebsiella pneumoniae NOT DETECTED NOT DETECTED   Proteus species NOT DETECTED NOT DETECTED   Serratia marcescens NOT DETECTED NOT DETECTED   Carbapenem resistance NOT DETECTED NOT DETECTED   Haemophilus influenzae NOT DETECTED NOT DETECTED   Neisseria meningitidis NOT DETECTED NOT DETECTED   Pseudomonas aeruginosa DETECTED (A) NOT DETECTED   Candida albicans NOT DETECTED NOT DETECTED   Candida glabrata NOT DETECTED NOT DETECTED   Candida krusei NOT DETECTED NOT DETECTED   Candida parapsilosis NOT DETECTED NOT DETECTED   Candida tropicalis NOT DETECTED NOT DETECTED    Name of physician (or Provider) Contacted: Dr. Catha GosselinMikhail  Changes to prescribed antibiotics required: Will change to Cefepime only - will cover both MSSA and Pseudomonas.  Toys 'R' UsKimberly Calden Dorsey, Pharm.D., BCPS Clinical Pharmacist 07/19/2016 4:44 PM

## 2016-07-19 NOTE — Progress Notes (Signed)
Spoke with Dr. Orvan Falconerampbell regarding pseudomonas in BCID.  Add back Cefepime.  Continue Nafcillin as well for MSSA bacteremia with concern for meningitis.  Orders placed.  Toys 'R' UsKimberly Glen Blatchley, Pharm.D., BCPS Clinical Pharmacist 07/19/2016 7:33 PM

## 2016-07-19 NOTE — Progress Notes (Signed)
Pharmacy Antibiotic Note  Johnathan MaduroChristopher [name] is a 46 y.o. male admitted on 07/19/2016 with cellulitis. Pharmacy has been consulted for zosyn and vancomycin dosing.  Vanc and Zosyn x 1 dose. Also given a dose of Rocephin for possible meningitis? Does report neck stiffness and subjective fevers at home. Afebrile, WBC 12.6. SCr 3.17, no hx of CKD. CrCl ~20-30 ml/min.  Plan: Start Zosyn 3.375 gm IV q8h (4 hour infusion) Start vancomycin 750mg  IV Q24h  Monitor clinical picture, renal function, VT prn F/U C&S, abx deescalation / LOT    Temp (24hrs), Avg:97.9 F (36.6 C), Min:97.9 F (36.6 C), Max:97.9 F (36.6 C)   Recent Labs Lab 07/18/16 2351 07/19/16 0010  WBC 12.6*  --   CREATININE 3.17*  --   LATICACIDVEN  --  2.63*    CrCl cannot be calculated (Unknown ideal weight.).    No Known Allergies  Antimicrobials this admission: Zosyn 7/13 >>  Vancomycin 7/13 >>   Dose adjustments this admission: n/a  Microbiology results: 7/13 BCx:   Thank you for allowing pharmacy to be a part of this patient's care.  Johnathan BiNathan Traivon [name], PharmD, BCPS Clinical Pharmacist Pager [phone] 07/19/2016 1:12 AM

## 2016-07-19 NOTE — ED Provider Notes (Signed)
[facility]-EMERGENCY DEPT Provider Note   CSN: [id number] Arrival date & time: 07/18/16  2319  By signing my name below, I, Deland PrettySherilynn Knight, attest that this documentation has been prepared under the direction and in the presence of Lidie Glade, Mayer Maskerourtney F, MD. Electronically Signed: Deland PrettySherilynn Knight, ED Scribe. 07/19/16. 4:54 AM.  History   Chief Complaint Chief Complaint  Patient presents with  . Cellulitis   The history is provided by the patient. No language interpreter was used.   HPI Comments: Johnathan [name] is a 46 y.o. male who presents to the Emergency Department complaining of redness, swelling, and warmth with associated intermittent fever (highest was 105F on 07/12/2016) to the bilateral feet that began 07/11/2016. Since his symptoms began, his symptoms have spread further up his legs. He applied cold compresses on these areas with great relief. The pt also notes associated 9/10 chills, SOB, neck and chest pain that have gradually worsened since the onset of his other symptoms. He has a h/x of cellulitis of his upper extremities. He denies a PMHx of meningitis. The pt reports has abdominal pain and rash on his abdomen and posterior neck. He denies headaches.     Past Medical History:  Diagnosis Date  . Substance abuse     Patient Active Problem List   Diagnosis Date Noted  . Sepsis (HCC) 07/19/2016  . Fever 07/19/2016  . UTI (urinary tract infection) 07/19/2016  . Tachycardia 07/19/2016  . ARF (acute renal failure) (HCC) 07/19/2016  . Hyponatremia 07/19/2016  . Abnormal liver function 07/19/2016  . Cellulitis of left upper extremity   . Hepatitis C antibody test positive 09/14/2014  . Substance abuse 09/14/2014  . Cigarette smoker 09/14/2014  . Poor dentition 09/14/2014  . Abscess of arm, left 09/12/2014    Past Surgical History:  Procedure Laterality Date  . Incision and drainage of bilateral forearm abscesses Bilateral   . KNEE ARTHROSCOPY         Home  Medications    Prior to Admission medications   Medication Sig Start Date End Date Taking? Authorizing Provider  SUBOXONE 8-2 MG FILM Take 2 Film by mouth daily. 07/02/16  Yes [provider]  amoxicillin-clavulanate (AUGMENTIN) 875-125 MG per tablet Take 1 tablet by mouth 2 (two) times daily. Patient not taking: Reported on 07/19/2016 09/16/14   Calvert Cantorizwan, Saima, MD  cyclobenzaprine (FLEXERIL) 10 MG tablet Take 1 tablet (10 mg total) by mouth 2 (two) times daily as needed for muscle spasms. Patient not taking: Reported on 09/12/2014 06/22/14   Elpidio AnisUpstill, Shari, PA-C  doxycycline (VIBRAMYCIN) 100 MG capsule Take 1 capsule (100 mg total) by mouth 2 (two) times daily. Patient not taking: Reported on 07/19/2016 09/16/14   Calvert Cantorizwan, Saima, MD  ibuprofen (ADVIL,MOTRIN) 800 MG tablet Take 1 tablet (800 mg total) by mouth 3 (three) times daily. Patient not taking: Reported on 09/12/2014 06/22/14   Elpidio AnisUpstill, Shari, PA-C    Family History History reviewed. No pertinent family history.  Social History Social History  Substance Use Topics  . Smoking status: Current Every Day Smoker    Packs/day: 0.50    Years: 15.00    Types: Cigarettes  . Smokeless tobacco: Never Used  . Alcohol use No     Comment: seldom once a year      Allergies   Patient has no known allergies.   Review of Systems Review of Systems  Constitutional: Positive for chills and fever.  Respiratory: Positive for shortness of breath.   Cardiovascular: Positive for chest pain.  Gastrointestinal: Negative for nausea and vomiting.  Musculoskeletal: Positive for neck pain and neck stiffness.  Skin: Positive for color change.  Neurological: Negative for headaches.  All other systems reviewed and are negative.    Physical Exam Updated Vital Signs BP 133/75   Pulse (!) 118   Temp 97.9 F (36.6 C) (Oral)   Resp (!) 31   Wt 68 kg (150 lb)   SpO2 94%   BMI 20.92 kg/m   Physical Exam  Constitutional: He is oriented to person,  place, and time.  Thin, ill-appearing  HENT:  Head: Normocephalic and atraumatic.  Eyes: Pupils are equal, round, and reactive to light.  Eyes mildly icteric  Neck: Neck supple.  Rigid neck, evidence of meningismus, unable to rotate axially or lift off the bed  Cardiovascular: Regular rhythm and normal heart sounds.   No murmur heard. Tachycardia  Pulmonary/Chest: Effort normal and breath sounds normal. No respiratory distress. He has no wheezes. He exhibits tenderness.  Increased respiratory rate, no accessory muscle use  Abdominal: Soft. Bowel sounds are normal. There is no tenderness. There is no rebound.  Musculoskeletal:  Bilateral pedal edema with erythema blanching over the dorsum of the bilateral feet, adjacent petechiae noted  Lymphadenopathy:    He has no cervical adenopathy.  Neurological: He is alert and oriented to person, place, and time.  Skin: Skin is warm and dry.  Psychiatric: He has a normal mood and affect.  Nursing note and vitals reviewed.    ED Treatments / Results   DIAGNOSTIC STUDIES: Oxygen Saturation is 93% on RA, normal by my interpretation.   COORDINATION OF CARE: 1:13 AM-Discussed next steps with pt. Pt verbalized understanding and is agreeable with the plan.   Labs (all labs ordered are listed, but only abnormal results are displayed) Labs Reviewed  COMPREHENSIVE METABOLIC PANEL - Abnormal; Notable for the following:       Result Value   Sodium 123 (*)    Chloride 89 (*)    Glucose, Bld 160 (*)    BUN 26 (*)    Creatinine, Ser 3.17 (*)    Calcium 8.1 (*)    Albumin 2.4 (*)    AST 47 (*)    Alkaline Phosphatase 206 (*)    Total Bilirubin 1.6 (*)    GFR calc non Af Amer 22 (*)    GFR calc Af Amer 26 (*)    All other components within normal limits  CBC WITH DIFFERENTIAL/PLATELET - Abnormal; Notable for the following:    WBC 12.6 (*)    Hemoglobin 12.1 (*)    HCT 35.7 (*)    Platelets 112 (*)    Neutro Abs 9.5 (*)    Monocytes  Absolute 1.1 (*)    All other components within normal limits  PROTIME-INR - Abnormal; Notable for the following:    Prothrombin Time 17.4 (*)    All other components within normal limits  URINALYSIS, ROUTINE W REFLEX MICROSCOPIC - Abnormal; Notable for the following:    Color, Urine AMBER (*)    APPearance CLOUDY (*)    Hgb urine dipstick MODERATE (*)    Bilirubin Urine SMALL (*)    Protein, ur 100 (*)    Bacteria, UA RARE (*)    Squamous Epithelial / LPF 0-5 (*)    Non Squamous Epithelial 0-5 (*)    All other components within normal limits  I-STAT CG4 LACTIC ACID, ED - Abnormal; Notable for the following:    Lactic Acid, Venous  2.63 (*)    All other components within normal limits  I-STAT CG4 LACTIC ACID, ED - Abnormal; Notable for the following:    Lactic Acid, Venous 2.38 (*)    All other components within normal limits  CULTURE, BLOOD (ROUTINE X 2)  CULTURE, BLOOD (ROUTINE X 2)  CULTURE, BLOOD (ROUTINE X 2)  CULTURE, BLOOD (ROUTINE X 2)  CSF CULTURE  RESPIRATORY PANEL BY PCR  CSF CELL COUNT WITH DIFFERENTIAL  CSF CELL COUNT WITH DIFFERENTIAL  GLUCOSE, CSF  PROTEIN, CSF  BASIC METABOLIC PANEL  I-STAT CG4 LACTIC ACID, ED    EKG  EKG Interpretation  Date/Time:  Friday July 19 2016 01:22:12 EDT Ventricular Rate:  104 PR Interval:    QRS Duration: 147 QT Interval:  368 QTC Calculation: 484 R Axis:   0 Text Interpretation:  Sinus tachycardia Right bundle branch block No prior for comparison Confirmed by Ross Marcus ([id number]) on 07/19/2016 2:23:12 AM       Radiology Dg Chest Port 1 View  Result Date: 07/19/2016 CLINICAL DATA:  Cellulitis. Worsening swelling and redness of both feet. Intermittent fever. Code sepsis. EXAM: PORTABLE CHEST 1 VIEW COMPARISON:  Radiograph 06/05/2016 FINDINGS: Emphysema. Lung volumes from prior exam. Normal heart size and mediastinal contours for technique. There are streaky opacities in both lower lobes, with slightly more patchy right  lung base opacity. Small right pleural effusion. No pneumothorax. No acute osseous abnormality. IMPRESSION: Emphysema. Streaky lower lobe opacities favoring atelectasis. Right lung base slightly more patchy which may reflect aspiration or pneumonia. Possible small right pleural effusion. Electronically Signed   By: Rubye Oaks M.D.   On: 07/19/2016 01:53    Procedures .Lumbar Puncture Date/Time: 07/19/2016 3:19 AM Performed by: Shon Baton Authorized by: Shon Baton   Consent:    Consent obtained:  Verbal   Consent given by:  Patient   Risks discussed:  Bleeding and nerve damage   Alternatives discussed:  No treatment Universal protocol:    Procedure explained and questions answered to patient or proxy's satisfaction: yes     Site/side marked: yes     Patient identity confirmed:  Verbally with patient Pre-procedure details:    Procedure purpose:  Diagnostic Procedure details:    Lumbar space:  L3-L4 interspace   Patient position:  L lateral decubitus   Needle gauge:  22   Needle length (in):  3.5   Ultrasound guidance: no     Number of attempts:  3   Opening pressure (cm H2O):  18   Fluid appearance:  Clear   Tubes of fluid:  4   Total volume (ml):  5 Post-procedure:    Puncture site:  Adhesive bandage applied     (including critical care time)  CRITICAL CARE Performed by: Shon Baton   Total critical care time: 40 minutes  Critical care time was exclusive of separately billable procedures and treating other patients.  Critical care was necessary to treat or prevent imminent or life-threatening deterioration.  Critical care was time spent personally by me on the following activities: development of treatment plan with patient and/or surrogate as well as nursing, discussions with consultants, evaluation of patient's response to treatment, examination of patient, obtaining history from patient or surrogate, ordering and performing treatments and  interventions, ordering and review of laboratory studies, ordering and review of radiographic studies, pulse oximetry and re-evaluation of patient's condition.   Medications Ordered in ED Medications  sodium chloride 0.9 % bolus 1,000 mL (0 mLs Intravenous  Stopped 07/19/16 0203)    And  sodium chloride 0.9 % bolus 1,000 mL (0 mLs Intravenous Stopped 07/19/16 0252)    And  sodium chloride 0.9 % bolus 500 mL (not administered)  vancomycin (VANCOCIN) IVPB 750 mg/150 ml premix (not administered)  piperacillin-tazobactam (ZOSYN) IVPB 3.375 g (not administered)  feeding supplement (PRO-STAT SUGAR FREE 64) liquid 30 mL (not administered)  sodium chloride 0.9 % bolus 1,000 mL (0 mLs Intravenous Stopped 07/19/16 0450)  piperacillin-tazobactam (ZOSYN) IVPB 3.375 g (0 g Intravenous Stopped 07/19/16 0219)  vancomycin (VANCOCIN) IVPB 1000 mg/200 mL premix (0 mg Intravenous Stopped 07/19/16 0243)  cefTRIAXone (ROCEPHIN) 2 g in dextrose 5 % 50 mL IVPB (0 g Intravenous Stopped 07/19/16 0259)  lidocaine (XYLOCAINE) 2 % (with pres) injection 400 mg (400 mg Infiltration Given by Other 07/19/16 0330)  morphine 4 MG/ML injection 4 mg (4 mg Intravenous Given 07/19/16 0328)     Initial Impression / Assessment and Plan / ED Course  I have reviewed the triage vital signs and the nursing notes.  Pertinent labs & imaging results that were available during my care of the patient were reviewed by me and considered in my medical decision making (see chart for details).     Patient presents with fever, concerns for cellulitis. Vital signs notable for tachycardia, hypotension, reported fevers at home. Concern for sepsis. Sepsis workup initiated. Patient was given 30 mL/kg fluid for hypotension. He was given broad-spectrum antibiotics for cellulitis including vancomycin and Zosyn. Would also be concerned for possible meningitis given neck stiffness. Ceftriaxone was added. Workup notable for acute kidney injury, hyponatremia,  lactate of 2.63.  Patient which improved with fluids. Blood pressure now normalized. He does not appear to be in refractory shock.  LP was performed with no acute complications. Labs pending. Patient will be admitted to the hospitalist for further management.  Final Clinical Impressions(s) / ED Diagnoses   Final diagnoses:  Severe sepsis (HCC)  Cellulitis of lower extremity, unspecified laterality  AKI (acute kidney injury) (HCC)    New Prescriptions New Prescriptions   No medications on file   I personally performed the services described in this documentation, which was scribed in my presence. The recorded information has been reviewed and is accurate.     Shon Baton, MD 07/19/16 [phone]

## 2016-07-19 NOTE — Progress Notes (Signed)
CRITICAL VALUE ALERT  Critical Value:  CSF WBC 51 (tube 1) & 180 (tube 2)  Date & Time Notied:  07/19/16 @ [id number]  Provider Notified: Schorr, NP  Orders Received/Actions taken: No orders at this time. Will continue to monitor.

## 2016-07-19 NOTE — Progress Notes (Signed)
Pharmacy Antibiotic Note  Johnathan MaduroChristopher [name] is a 46 y.o. male admitted on 07/19/2016 with concern for cellulitis + PNA. Pharmacy on board for Vancomycin + Zosyn dosing.  The patient was noted to have AKI on admission - now resolving. SCr down to 2.02, CrCl~40-50 ml/min. Will adjust Vancomycin dose today. Zosyn dose remains appropriate.   Plan: 1. Adjust Vancomycin to 500 mg/12h 2. Continue Zosyn 3.375g IV every 8 hours (infused over 4 hours) 3. Will continue to follow renal function, culture results, LOT, and antibiotic de-escalation plans   Height: 5\' 11"  (180.3 cm) Weight: 152 lb (68.9 kg) IBW/kg (Calculated) : 75.3  Temp (24hrs), Avg:99 F (37.2 C), Min:97.9 F (36.6 C), Max:99.6 F (37.6 C)   Recent Labs Lab 07/18/16 2351 07/19/16 0010 07/19/16 0207 07/19/16 0548  WBC 12.6*  --   --  9.6  CREATININE 3.17*  --   --  2.02*  LATICACIDVEN  --  2.63* 2.38*  --     Estimated Creatinine Clearance: 45 mL/min (A) (by C-G formula based on SCr of 2.02 mg/dL (H)).    No Known Allergies  Antimicrobials this admission: Zosyn 7/13 >>  Vancomycin 7/13 >>   Dose adjustments this admission: n/a  Microbiology results: 7/13 BCx >> 7/13 CSF cx >> 7/13 RVP >>  Thank you for allowing pharmacy to be a part of this patient's care.  Georgina PillionElizabeth Shemicka Cohrs, PharmD, BCPS Clinical Pharmacist Clinical phone for 07/19/2016 from 7a-3:30p: [phone] If after 3:30p, please call main pharmacy at: [phone] 07/19/2016 9:51 AM

## 2016-07-19 NOTE — Care Management Note (Addendum)
Case Management Note  Patient Details  Name: Johnathan MaduroChristopher Brougher MRN: [id number] Date of Birth: Mar 02, 1970  Subjective/Objective:  From home with son, pta indep, presents with cellulits of foot and R leg, on iv abx, he has P[name] [facility], he states he has [employer] [place] and his co pays are $3.50.  NCM called financial counselor and left message to see if patient Medicaid is active.   7/16 1742 Letha Capeeborah Kellene Mccleary RN BSN -  Found to have MSSA bacteremia, possible meningitis, and LE cellulitis. Plan for echocardiogram and possibly TEE. ID consulted. CT of head today to r/o septic emboli.    7/25 Letha Capeeborah Honest Vanleer RN, BSN - Pt transferred to ICU 7/17  for Precedex drip due to ETOH.   Pt is a current IVDA.  If pt requires long term IV antibiotics - pt will require SNF due to IVDA.  CSW tentatively consulted. 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 Patient with right pl effusion, IR to do thoracentesis.  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  planis for SNF , ( will need LOG for difficult placement).  Discussed in LOS 7/31.                       Action/Plan: NCM will cont to follow for dc needs.   Expected Discharge Date:                  Expected Discharge Plan:  Home/Self Care  In-House Referral:  NA  Discharge planning Services  CM Consult  Post Acute Care Choice:  NA Choice offered to:  NA  DME Arranged:  N/A DME Agency:  NA  HH Arranged:  NA HH Agency:  NA  Status of Service:  In process, will continue to follow  If discussed at Long Length of Stay Meetings, dates discussed:    Additional Comments:  Leone Havenaylor, Hulbert Branscome Clinton, RN 07/19/2016, 12:56 PM

## 2016-07-19 NOTE — Progress Notes (Signed)
PROGRESS NOTE    Johnathan [name]  KPT:[id number] DOB: 1970-09-18 DOA: 07/19/2016 PCP: [name], MD   Chief Complaint  Patient presents with  . Cellulitis    Brief Narrative:  HPI On 07/19/2016 by Dr. [name] Johnathan [name]  is a 46 y.o. male, w Hep C?, IVDA, (recently injected opana) apparently c/o fever intermittently for the past 1 week, and redness over the dorsum of the right foot and slight swelling.  Pt notes injecting Opana about 2 days ago.  Pt denies any recent dental work. Pt presented to ED due to subjective fevers.  In ED,  Pt found to have mild cellulitis of the right > left foot as well as uti wbc 6-30.  Pt noted to be in ARF bun/creat 26/3.17,  And to have abnormal LFT, Ast 47, Alt 25, Alk phos 206, T. Bili 1.6, Alb 2.4.  And hyponatremia  Na 123  , CXR right lung base slightly more patchy may reflect aspiration or pneumonia.  Possible small right pleural effusion   Pt will be admitted for sepsis secondary to uti vs cellulitis and ARF and hyponatremia.  (note LP pending due to ? Neck stiffness) Assessment & Plan   Admitted earlier today by Dr. [name]. See H&P for details.  Sepsis secondary to pneumonia/urinary tract infection/cellulitis -Patient presented with fever, tachycardia -Blood cultures pending -Respiratory viral panel -Continue vancomycin and Zosyn  ?Urinary tract infection -UA: Rare bacteria, 6-30 WBC, negative leukocytes and negative nitrites -Urine culture pending -Continue Zosyn  Cellulitis of the right lower extremity, foot -Continue vancomycin  Right lower lobe pneumonia versus aspiration pneumonia -Chest x-ray showed emphysema, streaky lower lobe opacities favoring atelectasis. Right lung base slightly more patchy, may reflect aspiration or pneumonia -Speech consulted for possible aspiration, recommended regular thin liquid diet -Continue vancomycin and Zosyn  Acute renal failure -Renal ultrasound showed mild right  pelvicaliectasis with ureteral jet noted and urinary bladder excluding significant ureteral obstruction -Creatinine upon admission 3.17, currently 2.02 -Continue IV fluids and monitor BMP  Hyponatremia -Sodium improving, continue IV fluids and monitor BMP  Polysubstance abuse -Recently injected Opana a couple of days ago. -Continue Suboxone  Neck pain -Status post lumbar puncture, CSF culture shows no growth to date  Abnormal TSH -TSH 0.208 -Will obtain free T4  DVT Prophylaxis  Heparin  Code Status: Full  Family Communication: Friend at bedside   Disposition Plan: Admitted.   Consultants None  Procedures  Renal US  Antibiotics   Anti-infectives    Start     Dose/Rate Route Frequency Ordered Stop   07/19/16 2300  vancomycin (VANCOCIN) IVPB 750 mg/150 ml premix  Status:  Discontinued     750 mg 150 mL/hr over 60 Minutes Intravenous Every 24 hours 07/19/16 0237 07/19/16 0953   07/19/16 1100  vancomycin (VANCOCIN) 500 mg in sodium chloride 0.9 % 100 mL IVPB     500 mg 100 mL/hr over 60 Minutes Intravenous Every 12 hours 07/19/16 0953     07/19/16 0800  piperacillin-tazobactam (ZOSYN) IVPB 3.375 g     3.375 g 12.5 mL/hr over 240 Minutes Intravenous Every 8 hours 07/19/16 0239     07/19/16 0200  cefTRIAXone (ROCEPHIN) 2 g in dextrose 5 % 50 mL IVPB     2 g 100 mL/hr over 30 Minutes Intravenous  Once 07/19/16 0140 07/19/16 0259   07/19/16 0130  cefTRIAXone (ROCEPHIN) 1 g in dextrose 5 % 50 mL IVPB  Status:  Discontinued     1 g  100 mL/hr over 30 Minutes Intravenous  Once 07/19/16 0125 07/19/16 0140   07/19/16 0115  piperacillin-tazobactam (ZOSYN) IVPB 3.375 g     3.375 g 100 mL/hr over 30 Minutes Intravenous  Once 07/19/16 0109 07/19/16 0219   07/19/16 0115  vancomycin (VANCOCIN) IVPB 1000 mg/200 mL premix     1,000 mg 200 mL/hr over 60 Minutes Intravenous  Once 07/19/16 0109 07/19/16 0243      Subjective:   [name] seen and examined today.  Complains  of pain everywhere. Feels his leg swelling has not improved. Denies current chest pain, shortness of breath, abdominal pain.  Objective:   Vitals:   07/19/16 0525 07/19/16 0751 07/19/16 1218 07/19/16 1602  BP:  110/65 107/62 98/62  Pulse: (!) 120 (!) 125 (!) 101 86  Resp: (!) 30 (!) 28 (!) 32 14  Temp: 99.5 F (37.5 C) 99.6 F (37.6 C) 97.9 F (36.6 C) 97.9 F (36.6 C)  TempSrc: Oral Oral Oral Oral  SpO2: 91% 94% 94% 100%  Weight: 68.9 kg (152 lb)     Height: '5\' 11"'  (1.803 m)       Intake/Output Summary (Last 24 hours) at 07/19/16 1619 Last data filed at 07/19/16 1300  Gross per 24 hour  Intake             3903 ml  Output                0 ml  Net             3903 ml   Filed Weights   07/19/16 0225 07/19/16 0525  Weight: 68 kg (150 lb) 68.9 kg (152 lb)    Exam  General: Well developed,Ill appearing, no apparent distress   HEENT: NCAT, mucous membranes moist.   Cardiovascular: S1 S2 auscultated,tachycardic   Respiratory: Clear to auscultation bilaterally with equal chest rise  Abdomen: Soft, nontender, nondistended, + bowel sounds  Extremities: warm dry without cyanosis clubbing. LE edmea, more so in RLE edema with erythema  Neuro: AAOx3,nonfocal   Psych:anxious  Data Reviewed: I have personally reviewed following labs and imaging studies  CBC:  Recent Labs Lab 07/18/16 2351 07/19/16 0548  WBC 12.6* 9.6  NEUTROABS 9.5*  --   HGB 12.1* 11.0*  HCT 35.7* 32.3*  MCV 84.0 82.8  PLT 112* 301*   Basic Metabolic Panel:  Recent Labs Lab 07/18/16 2351 07/19/16 0548  NA 123* 129*  K 4.1 3.2*  CL 89* 100*  CO2 26 21*  GLUCOSE 160* 98  BUN 26* 24*  CREATININE 3.17* 2.02*  CALCIUM 8.1* 6.8*   GFR: Estimated Creatinine Clearance: 45 mL/min (A) (by C-G formula based on SCr of 2.02 mg/dL (H)). Liver Function Tests:  Recent Labs Lab 07/18/16 2351 07/19/16 0548  AST 47* 51*  ALT 25 27  ALKPHOS 206* 207*  BILITOT 1.6* 1.4*  PROT 6.6 5.6*  ALBUMIN  2.4* 1.8*   No results for input(s): LIPASE, AMYLASE in the last 168 hours. No results for input(s): AMMONIA in the last 168 hours. Coagulation Profile:  Recent Labs Lab 07/18/16 2351  INR 1.41   Cardiac Enzymes:  Recent Labs Lab 07/19/16 0548 07/19/16 1108  TROPONINI <0.03 0.06*   BNP (last 3 results) No results for input(s): PROBNP in the last 8760 hours. HbA1C: No results for input(s): HGBA1C in the last 72 hours. CBG: No results for input(s): GLUCAP in the last 168 hours. Lipid Profile: No results for input(s): CHOL, HDL, LDLCALC, TRIG, CHOLHDL, LDLDIRECT  in the last 72 hours. Thyroid Function Tests:  Recent Labs  07/19/16 0606  TSH 0.208*   Anemia Panel: No results for input(s): VITAMINB12, FOLATE, FERRITIN, TIBC, IRON, RETICCTPCT in the last 72 hours. Urine analysis:    Component Value Date/Time   COLORURINE AMBER (A) 07/18/2016 2255   APPEARANCEUR CLOUDY (A) 07/18/2016 2255   LABSPEC 1.018 07/18/2016 2255   PHURINE 5.0 07/18/2016 2255   GLUCOSEU NEGATIVE 07/18/2016 2255   HGBUR MODERATE (A) 07/18/2016 2255   BILIRUBINUR SMALL (A) 07/18/2016 2255   KETONESUR NEGATIVE 07/18/2016 2255   PROTEINUR 100 (A) 07/18/2016 2255   NITRITE NEGATIVE 07/18/2016 2255   LEUKOCYTESUR NEGATIVE 07/18/2016 2255   Sepsis Labs: '@LABRCNTIP' (procalcitonin:4,lacticidven:4)  ) Recent Results (from the past 240 hour(s))  Culture, blood (Routine x 2)     Status: None (Preliminary result)   Collection Time: 07/18/16 10:55 PM  Result Value Ref Range Status   Specimen Description BLOOD RIGHT ARM  Final   Special Requests IN PEDIATRIC BOTTLE Blood Culture adequate volume  Final   Culture  Setup Time   Final    GRAM POSITIVE COCCI IN CLUSTERS IN PEDIATRIC BOTTLE    Culture GRAM POSITIVE COCCI IN CLUSTERS  Final   Report Status PENDING  Incomplete  Culture, blood (Routine x 2)     Status: None (Preliminary result)   Collection Time: 07/18/16 11:45 PM  Result Value Ref Range  Status   Specimen Description BLOOD LEFT ARM  Final   Special Requests   Final    BOTTLES DRAWN AEROBIC AND ANAEROBIC Blood Culture adequate volume   Culture  Setup Time   Final    GRAM POSITIVE COCCI IN CLUSTERS IN BOTH AEROBIC AND ANAEROBIC BOTTLES CRITICAL VALUE NOTED.  VALUE IS CONSISTENT WITH PREVIOUSLY REPORTED AND CALLED VALUE.    Culture GRAM POSITIVE COCCI IN CLUSTERS  Final   Report Status PENDING  Incomplete  CSF culture     Status: None (Preliminary result)   Collection Time: 07/19/16  4:07 AM  Result Value Ref Range Status   Specimen Description CSF  Final   Special Requests NONE  Final   Gram Stain   Final    CYTOSPIN SMEAR WBC PRESENT, PREDOMINANTLY PMN NO ORGANISMS SEEN    Culture PENDING  Incomplete   Report Status PENDING  Incomplete  MRSA PCR Screening     Status: None   Collection Time: 07/19/16  5:26 AM  Result Value Ref Range Status   MRSA by PCR NEGATIVE NEGATIVE Final    Comment:        The GeneXpert MRSA Assay (FDA approved for NASAL specimens only), is one component of a comprehensive MRSA colonization surveillance program. It is not intended to diagnose MRSA infection nor to guide or monitor treatment for MRSA infections.   Respiratory Panel by PCR     Status: None   Collection Time: 07/19/16 12:19 PM  Result Value Ref Range Status   Adenovirus NOT DETECTED NOT DETECTED Final   Coronavirus 229E NOT DETECTED NOT DETECTED Final   Coronavirus HKU1 NOT DETECTED NOT DETECTED Final   Coronavirus NL63 NOT DETECTED NOT DETECTED Final   Coronavirus OC43 NOT DETECTED NOT DETECTED Final   Metapneumovirus NOT DETECTED NOT DETECTED Final   Rhinovirus / Enterovirus NOT DETECTED NOT DETECTED Final   Influenza A NOT DETECTED NOT DETECTED Final   Influenza B NOT DETECTED NOT DETECTED Final   Parainfluenza Virus 1 NOT DETECTED NOT DETECTED Final   Parainfluenza Virus 2 NOT DETECTED  NOT DETECTED Final   Parainfluenza Virus 3 NOT DETECTED NOT DETECTED Final     Parainfluenza Virus 4 NOT DETECTED NOT DETECTED Final   Respiratory Syncytial Virus NOT DETECTED NOT DETECTED Final   Bordetella pertussis NOT DETECTED NOT DETECTED Final   Chlamydophila pneumoniae NOT DETECTED NOT DETECTED Final   Mycoplasma pneumoniae NOT DETECTED NOT DETECTED Final      Radiology Studies: US Renal  Result Date: 07/19/2016 CLINICAL DATA:  Acute renal failure EXAM: RENAL / URINARY TRACT ULTRASOUND COMPLETE COMPARISON:  CT 08/12/2010 FINDINGS: Right Kidney: Length: 12.2 cm. Parenchyma isoechoic to adjacent liver. No focal lesion. Mild pelvicaliectasis. Left Kidney: Length: 12.9 cm. 1.1 x 0.4 cm cortical cyst, midpole. No solid mass or hydronephrosis. Bladder: Distended.  Right ureteral jet documented. There is a small amount of abdominal ascites identified. Bilateral pleural effusions noted. Accessory splenule. IMPRESSION: 1. Mild right pelvicaliectasis, with ureteral jet noted in the urinary bladder excluding significant ureteral obstruction. 2. Small left renal cyst. 3. Pleural effusions and abdominal ascites. Electronically Signed   By: Lucrezia Europe M.D.   On: 07/19/2016 13:17   Dg Chest Port 1 View  Result Date: 07/19/2016 CLINICAL DATA:  Cellulitis. Worsening swelling and redness of both feet. Intermittent fever. Code sepsis. EXAM: PORTABLE CHEST 1 VIEW COMPARISON:  Radiograph 06/05/2016 FINDINGS: Emphysema. Lung volumes from prior exam. Normal heart size and mediastinal contours for technique. There are streaky opacities in both lower lobes, with slightly more patchy right lung base opacity. Small right pleural effusion. No pneumothorax. No acute osseous abnormality. IMPRESSION: Emphysema. Streaky lower lobe opacities favoring atelectasis. Right lung base slightly more patchy which may reflect aspiration or pneumonia. Possible small right pleural effusion. Electronically Signed   By: Jeb Levering M.D.   On: 07/19/2016 01:53     Scheduled Meds: . buprenorphine-naloxone   1 tablet Sublingual BID  . feeding supplement (PRO-STAT SUGAR FREE 64)  30 mL Oral BID  . heparin  5,000 Units Subcutaneous Q8H  . sodium chloride flush  3 mL Intravenous Q12H   Continuous Infusions: . sodium chloride 75 mL/hr at 07/19/16 0654  . piperacillin-tazobactam (ZOSYN)  IV Stopped (07/19/16 1259)  . vancomycin Stopped (07/19/16 1303)     LOS: 0 days   Time Spent in minutes   30 minutes  Valla Pacey D.O. on 07/19/2016 at 4:19 PM  Between 7am to 7pm - Pager - [phone]  After 7pm go to [email or web address] - password [email or web address]  And look for the night coverage person covering for me after hours  [facility] Office  [phone]

## 2016-07-20 ENCOUNTER — Inpatient Hospital Stay (HOSPITAL_COMMUNITY): Payer: Medicaid Other

## 2016-07-20 DIAGNOSIS — R7881 Bacteremia: Secondary | ICD-10-CM | POA: Diagnosis present

## 2016-07-20 DIAGNOSIS — B965 Pseudomonas (aeruginosa) (mallei) (pseudomallei) as the cause of diseases classified elsewhere: Secondary | ICD-10-CM | POA: Diagnosis present

## 2016-07-20 DIAGNOSIS — J189 Pneumonia, unspecified organism: Secondary | ICD-10-CM

## 2016-07-20 DIAGNOSIS — F1990 Other psychoactive substance use, unspecified, uncomplicated: Secondary | ICD-10-CM

## 2016-07-20 DIAGNOSIS — L03119 Cellulitis of unspecified part of limb: Secondary | ICD-10-CM

## 2016-07-20 DIAGNOSIS — E871 Hypo-osmolality and hyponatremia: Secondary | ICD-10-CM

## 2016-07-20 DIAGNOSIS — A419 Sepsis, unspecified organism: Secondary | ICD-10-CM

## 2016-07-20 DIAGNOSIS — R6520 Severe sepsis without septic shock: Secondary | ICD-10-CM

## 2016-07-20 DIAGNOSIS — G9340 Encephalopathy, unspecified: Secondary | ICD-10-CM

## 2016-07-20 LAB — CBC
HEMATOCRIT: 32.2 % — AB (ref 39.0–52.0)
HEMOGLOBIN: 11.2 g/dL — AB (ref 13.0–17.0)
MCH: 28.6 pg (ref 26.0–34.0)
MCHC: 34.8 g/dL (ref 30.0–36.0)
MCV: 82.4 fL (ref 78.0–100.0)
Platelets: 99 10*3/uL — ABNORMAL LOW (ref 150–400)
RBC: 3.91 MIL/uL — AB (ref 4.22–5.81)
RDW: 15.1 % (ref 11.5–15.5)
WBC: 7.6 10*3/uL (ref 4.0–10.5)

## 2016-07-20 LAB — BASIC METABOLIC PANEL
ANION GAP: 6 (ref 5–15)
BUN: 18 mg/dL (ref 6–20)
CHLORIDE: 101 mmol/L (ref 101–111)
CO2: 24 mmol/L (ref 22–32)
CREATININE: 1.06 mg/dL (ref 0.61–1.24)
Calcium: 7.3 mg/dL — ABNORMAL LOW (ref 8.9–10.3)
GFR calc non Af Amer: >60 mL/min (ref >60)
Glucose, Bld: 109 mg/dL — ABNORMAL HIGH (ref 65–99)
POTASSIUM: 3.2 mmol/L — AB (ref 3.5–5.1)
SODIUM: 131 mmol/L — AB (ref 135–145)

## 2016-07-20 LAB — ECHOCARDIOGRAM COMPLETE
Height: 71 in
WEIGHTICAEL: 2272 [oz_av]

## 2016-07-20 LAB — LACTIC ACID, PLASMA
LACTIC ACID, VENOUS: 0.9 mmol/L (ref 0.5–1.9)
LACTIC ACID, VENOUS: 1.8 mmol/L (ref 0.5–1.9)

## 2016-07-20 LAB — T4, FREE: Free T4: 2.51 ng/dL — ABNORMAL HIGH (ref 0.61–1.12)

## 2016-07-20 MED ORDER — HYDROCODONE-ACETAMINOPHEN 5-325 MG PO TABS
1.0000 | ORAL_TABLET | Freq: Four times a day (QID) | ORAL | Status: DC | PRN
  Administered 2016-07-20 – 2016-07-22 (×8): 1 via ORAL
  Filled 2016-07-20 (×10): qty 1

## 2016-07-20 MED ORDER — MORPHINE SULFATE (PF) 4 MG/ML IV SOLN
2.0000 mg | INTRAVENOUS | Status: DC | PRN
  Administered 2016-07-20 – 2016-07-23 (×14): 2 mg via INTRAVENOUS
  Filled 2016-07-20 (×14): qty 1

## 2016-07-20 MED ORDER — DEXTROSE 5 % IV SOLN
2.0000 g | Freq: Two times a day (BID) | INTRAVENOUS | Status: DC
  Administered 2016-07-20 – 2016-07-22 (×4): 2 g via INTRAVENOUS
  Filled 2016-07-20 (×5): qty 2

## 2016-07-20 MED ORDER — IOPAMIDOL (ISOVUE-300) INJECTION 61%
INTRAVENOUS | Status: AC
  Filled 2016-07-20: qty 75

## 2016-07-20 MED ORDER — BENZONATATE 100 MG PO CAPS
200.0000 mg | ORAL_CAPSULE | Freq: Three times a day (TID) | ORAL | Status: DC
  Administered 2016-07-20 – 2016-07-21 (×6): 200 mg via ORAL
  Filled 2016-07-20 (×16): qty 2

## 2016-07-20 NOTE — Plan of Care (Signed)
Problem: Pain Managment: Goal: General experience of comfort will improve Outcome: Progressing Patient reports 7/10 generalized pain all over. PRN Morphine 2mg  given to patient, patient thankful for assistance.  Problem: Nutrition: Goal: Adequate nutrition will be maintained Outcome: Progressing Patient finished orange juice from breakfast tray but no appetite for french toast and bacon. Patient consumed all of pro-stat supplement and requested chocolate ensure which is now available on bedside table. Patient has had 2 cokes and currently has NS @ 75ml.hr infusing continuously.

## 2016-07-20 NOTE — Progress Notes (Signed)
Patient states that he was not injecting drugs into his foot, he states this started because a friend stepped on his foot.

## 2016-07-20 NOTE — Progress Notes (Signed)
  Echocardiogram 2D Echocardiogram has been performed.  Arvil ChacoFoster, Maruice Pieroni 07/20/2016, 12:44 PM

## 2016-07-20 NOTE — Progress Notes (Signed)
Pharmacy Antibiotic Note  Johnathan MaduroChristopher [name] is a 46 y.o. male admitted on 07/19/2016 with MSSA and pseudomonas bacteremia.  Based on rapid ID for blood cx ,Cefepime added on 7/13.  Admission blood cultures have grown MSSA in both sets and Pseudomonas in one set. Repeat blood cultures were obtained this morning AKI: Scr improved to 1.06, CrCl 3980ml/min.  I/O 3045.05/1323, UOP 0.9 ml/kg/hr.   Plan: Adjust Cefepime to 2gm IV q12h Continue Nafcillin 2g IV q4h Follow-up cx / sensitivities and renal function.  Height: 5\' 11"  (180.3 cm) Weight: 142 lb (64.4 kg) IBW/kg (Calculated) : 75.3  Temp (24hrs), Avg:98.3 F (36.8 C), Min:97.9 F (36.6 C), Max:98.9 F (37.2 C)   Recent Labs Lab 07/18/16 2351 07/19/16 0010 07/19/16 0207 07/19/16 0548 07/20/16 0656 07/20/16 1032  WBC 12.6*  --   --  9.6 7.6  --   CREATININE 3.17*  --   --  2.02* 1.06  --   LATICACIDVEN  --  2.63* 2.38*  --  0.9 1.8    Estimated Creatinine Clearance: 80.2 mL/min (by C-G formula based on SCr of 1.06 mg/dL).    No Known Allergies  Antimicrobials this admission: Vanc 7/13 >> 7/13 Zosyn 7/ 13 >> 7/13 Cefepime 7/13 >>   Dose adjustments this admission:  Microbiology results: 7/12 BCx >> 2/2 GPC in clusters  and pseudomonas , Sens pending         BCID >> MSSA and pseudomonas 7/13 CSF cx >>ngtd 7/13 RVP >> negative 7/13 MRSA PCR negative 7/13 UCx sent 7/14 BCx : sent    Thank you for allowing pharmacy to be a part of this patient's care. Noah Delaineuth Adriyanna Christians, RPh Clinical Pharmacist Pager: [phone] [facility] [phone] 07/20/2016 2:11 PM

## 2016-07-20 NOTE — Progress Notes (Signed)
Patient ID: Johnathan [name], male   DOB: 1970/10/20, 46 y.o.   MRN: [id number]          [facility]  Date of Admission:  07/19/2016   Total days of antibiotics 3         ASSESSMENT: Admission blood cultures have grown MSSA in both sets and Pseudomonas in one set. Repeat blood cultures were obtained this morning Pseudomonas susceptibilities are pending. CSF cultures remain negative. His renal function has returned to normal.  PLAN: Continue nafcillin and cefepime  Await results of final cultures and echocardiogram Head CT scan with contrast  Principal Problem:   MSSA bacteremia Active Problems:   Sepsis (HCC)   Meningitis   Pneumonia   AKI (acute kidney injury) (HCC)   IVDU (intravenous drug user)   UTI (urinary tract infection)   ARF (acute renal failure) (HCC)   Hyponatremia   Abnormal liver function   SUBJECTIVE: He says he is feeling a little bit better today. He is not having any headache currently.  Review of Systems: Review of Systems  Constitutional: Positive for malaise/fatigue. Negative for chills, diaphoresis and fever.  Respiratory: Positive for cough, sputum production and shortness of breath.   Cardiovascular: Positive for chest pain.  Gastrointestinal: Negative for abdominal pain, diarrhea, nausea and vomiting.  Musculoskeletal: Positive for joint pain.  Neurological: Negative for headaches.  Psychiatric/Behavioral: Positive for substance abuse.    No Known Allergies  OBJECTIVE: Vitals:   07/20/16 0900 07/20/16 0930 07/20/16 1141 07/20/16 1200  BP: 126/86  128/87 107/78  Pulse: (!) 102 (!) 107 95 95  Resp: (!) 24 (!) 22 (!) 25 (!) 29  Temp:   98.5 F (36.9 C)   TempSrc:   Oral   SpO2: 99% 98% 98% 99%  Weight:      Height:       Body mass index is 19.8 kg/m.  Physical Exam  Constitutional:  He is sitting up in bed. Transthoracic echocardiogram was just completed. He is groggy but answers questions  appropriately.  Eyes: Conjunctivae are normal.  Neck: Neck supple.  Cardiovascular: Normal rate and regular rhythm.   No murmur heard. Pulmonary/Chest: Effort normal. He has no wheezes. He has rales.  Scattered right sided crackles  Abdominal: Soft. He exhibits no distension. There is no tenderness.  Musculoskeletal: Normal range of motion. He exhibits no edema or tenderness.  Skin:  Bilateral foot edema has improved. Faint petechiae over dorsum of left foot.    Lab Results Lab Results  Component Value Date   WBC 7.6 07/20/2016   HGB 11.2 (L) 07/20/2016   HCT 32.2 (L) 07/20/2016   MCV 82.4 07/20/2016   PLT 99 (L) 07/20/2016    Lab Results  Component Value Date   CREATININE 1.06 07/20/2016   BUN 18 07/20/2016   NA 131 (L) 07/20/2016   K 3.2 (L) 07/20/2016   CL 101 07/20/2016   CO2 24 07/20/2016    Lab Results  Component Value Date   ALT 27 07/19/2016   AST 51 (H) 07/19/2016   ALKPHOS 207 (H) 07/19/2016   BILITOT 1.4 (H) 07/19/2016     Microbiology: Recent Results (from the past 240 hour(s))  Culture, blood (Routine x 2)     Status: Abnormal (Preliminary result)   Collection Time: 07/18/16 10:55 PM  Result Value Ref Range Status   Specimen Description BLOOD RIGHT ARM  Final   Special Requests IN PEDIATRIC BOTTLE Blood Culture adequate volume  Final  Culture  Setup Time   Final    GRAM POSITIVE COCCI IN CLUSTERS IN PEDIATRIC BOTTLE CRITICAL RESULT CALLED TO, READ BACK BY AND VERIFIED WITH: K HAMMONS,PHARMD AT 1619 07/19/16 BY L BENFIELD    Culture STAPHYLOCOCCUS AUREUS (A)  Final   Report Status PENDING  Incomplete  Blood Culture ID Panel (Reflexed)     Status: Abnormal   Collection Time: 07/18/16 10:55 PM  Result Value Ref Range Status   Enterococcus species NOT DETECTED NOT DETECTED Final   Listeria monocytogenes NOT DETECTED NOT DETECTED Final   Staphylococcus species DETECTED (A) NOT DETECTED Final    Comment: CRITICAL RESULT CALLED TO, READ BACK BY AND  VERIFIED WITH: K HAMMONS, PHARMD AT 1619 07/19/16 BY L BENFIELD    Staphylococcus aureus DETECTED (A) NOT DETECTED Final    Comment: Methicillin (oxacillin) susceptible Staphylococcus aureus (MSSA). Preferred therapy is anti staphylococcal beta lactam antibiotic (Cefazolin or Nafcillin), unless clinically contraindicated. CRITICAL RESULT CALLED TO, READ BACK BY AND VERIFIED WITH: K HAMMONS,PHARMD AT 1619 07/19/16 BY L BENFIELD    Methicillin resistance NOT DETECTED NOT DETECTED Final   Streptococcus species NOT DETECTED NOT DETECTED Final   Streptococcus agalactiae NOT DETECTED NOT DETECTED Final   Streptococcus pneumoniae NOT DETECTED NOT DETECTED Final   Streptococcus pyogenes NOT DETECTED NOT DETECTED Final   Acinetobacter baumannii NOT DETECTED NOT DETECTED Final   Enterobacteriaceae species NOT DETECTED NOT DETECTED Final   Enterobacter cloacae complex NOT DETECTED NOT DETECTED Final   Escherichia coli NOT DETECTED NOT DETECTED Final   Klebsiella oxytoca NOT DETECTED NOT DETECTED Final   Klebsiella pneumoniae NOT DETECTED NOT DETECTED Final   Proteus species NOT DETECTED NOT DETECTED Final   Serratia marcescens NOT DETECTED NOT DETECTED Final   Carbapenem resistance NOT DETECTED NOT DETECTED Final   Haemophilus influenzae NOT DETECTED NOT DETECTED Final   Neisseria meningitidis NOT DETECTED NOT DETECTED Final   Pseudomonas aeruginosa DETECTED (A) NOT DETECTED Final    Comment: CRITICAL RESULT CALLED TO, READ BACK BY AND VERIFIED WITH: K HAMMONS,PHARMD AT 1619 07/19/16 BY L BENFIELD    Candida albicans NOT DETECTED NOT DETECTED Final   Candida glabrata NOT DETECTED NOT DETECTED Final   Candida krusei NOT DETECTED NOT DETECTED Final   Candida parapsilosis NOT DETECTED NOT DETECTED Final   Candida tropicalis NOT DETECTED NOT DETECTED Final  Culture, blood (Routine x 2)     Status: Abnormal (Preliminary result)   Collection Time: 07/18/16 11:45 PM  Result Value Ref Range Status    Specimen Description BLOOD LEFT ARM  Final   Special Requests   Final    BOTTLES DRAWN AEROBIC AND ANAEROBIC Blood Culture adequate volume   Culture  Setup Time   Final    GRAM POSITIVE COCCI IN CLUSTERS IN BOTH AEROBIC AND ANAEROBIC BOTTLES CRITICAL VALUE NOTED.  VALUE IS CONSISTENT WITH PREVIOUSLY REPORTED AND CALLED VALUE.    Culture (A)  Final    STAPHYLOCOCCUS AUREUS PSEUDOMONAS AERUGINOSA SUSCEPTIBILITIES TO FOLLOW    Report Status PENDING  Incomplete  CSF culture     Status: None (Preliminary result)   Collection Time: 07/19/16  4:07 AM  Result Value Ref Range Status   Specimen Description CSF  Final   Special Requests NONE  Final   Gram Stain   Final    CYTOSPIN SMEAR WBC PRESENT, PREDOMINANTLY PMN NO ORGANISMS SEEN    Culture NO GROWTH 1 DAY  Final   Report Status PENDING  Incomplete  MRSA PCR Screening  Status: None   Collection Time: 07/19/16  5:26 AM  Result Value Ref Range Status   MRSA by PCR NEGATIVE NEGATIVE Final    Comment:        The GeneXpert MRSA Assay (FDA approved for NASAL specimens only), is one component of a comprehensive MRSA colonization surveillance program. It is not intended to diagnose MRSA infection nor to guide or monitor treatment for MRSA infections.   Respiratory Panel by PCR     Status: None   Collection Time: 07/19/16 12:19 PM  Result Value Ref Range Status   Adenovirus NOT DETECTED NOT DETECTED Final   Coronavirus 229E NOT DETECTED NOT DETECTED Final   Coronavirus HKU1 NOT DETECTED NOT DETECTED Final   Coronavirus NL63 NOT DETECTED NOT DETECTED Final   Coronavirus OC43 NOT DETECTED NOT DETECTED Final   Metapneumovirus NOT DETECTED NOT DETECTED Final   Rhinovirus / Enterovirus NOT DETECTED NOT DETECTED Final   Influenza A NOT DETECTED NOT DETECTED Final   Influenza B NOT DETECTED NOT DETECTED Final   Parainfluenza Virus 1 NOT DETECTED NOT DETECTED Final   Parainfluenza Virus 2 NOT DETECTED NOT DETECTED Final    Parainfluenza Virus 3 NOT DETECTED NOT DETECTED Final   Parainfluenza Virus 4 NOT DETECTED NOT DETECTED Final   Respiratory Syncytial Virus NOT DETECTED NOT DETECTED Final   Bordetella pertussis NOT DETECTED NOT DETECTED Final   Chlamydophila pneumoniae NOT DETECTED NOT DETECTED Final   Mycoplasma pneumoniae NOT DETECTED NOT DETECTED Final    Cliffton Asters, MD [facility] [phone] pager   [phone] cell 07/20/2016, 12:37 PM

## 2016-07-20 NOTE — Progress Notes (Signed)
Patient has had a productive cough with increased sputum production all night. He has had episodes of labored breathing, shortness of breath, and chest pain while coughing. Burnadette PeterLynch, NP notified and received order for scheduled Mucinex [facility] Will continue to monitor.

## 2016-07-20 NOTE — Progress Notes (Signed)
PROGRESS NOTE    Johnathan [name]  PQH:[id number] DOB: 09/07/1970 DOA: 07/19/2016 PCP: Karle Plumber, MD   Chief Complaint  Patient presents with  . Cellulitis    Brief Narrative:  HPI On 07/19/2016 by Dr. Pearson Grippe Johnathan [name]  is a 46 y.o. male, w Hep C?, IVDA, (recently injected opana) apparently c/o fever intermittently for the past 1 week, and redness over the dorsum of the right foot and slight swelling.  Pt notes injecting Opana about 2 days ago.  Pt denies any recent dental work. Pt presented to ED due to subjective fevers.  In ED,  Pt found to have mild cellulitis of the right > left foot as well as uti wbc 6-30.  Pt noted to be in ARF bun/creat 26/3.17,  And to have abnormal LFT, Ast 47, Alt 25, Alk phos 206, T. Bili 1.6, Alb 2.4.  And hyponatremia  Na 123  , CXR right lung base slightly more patchy may reflect aspiration or pneumonia.  Possible small right pleural effusion   Pt will be admitted for sepsis secondary to uti vs cellulitis and ARF and hyponatremia.  (note LP pending due to ? Neck stiffness)  Interim history Found to have MSSA bacteremia, possible meningitis, and LE cellulitis. Plan for echocardiogram and possibly TEE. ID consulted.  Assessment & Plan   Sepsis secondary to MSSA bacteremia/possible meningitis/pneumonia/cellulitis  -Patient presented with fever, tachycardia, tachypnea -Status post lumbar puncture: High WBC and protein, therefore meningitis suspected. -CSF culture shows no growth to one date, no organisms seen on Gram stain -Blood cultures 07/18/16 Staphylococcus aureus (2/2), Pseudomonas aeruginosa (however gram stain showed no GN) -Respiratory viral panel unremarkable  -Initially placed on vancomycin and Zosyn however given MSSA and Pseudomonas, patient placed on cefepime -Infectious disease consulted and appreciated, started nafcillin -Concern for endocarditis, echocardiogram pending -Will obtain CT with and without contrast of the head  (concern for septic emboli)  ?Urinary tract infection -UA: Rare bacteria, 6-30 WBC, negative leukocytes and negative nitrites -patient thought to have a UTI on admission, however, no symptoms of UTI and UA unimpressive for infection -Urine culture pending  Cellulitis of the right lower extremity, foot -As above, was on vancomycin however has been transitioned to nafcillin and cefepime -Appears to be improving  Right lower lobe pneumonia versus aspiration pneumonia with cough -Chest x-ray showed emphysema, streaky lower lobe opacities favoring atelectasis. Right lung base slightly more patchy, may reflect aspiration or pneumonia -Speech consulted for possible aspiration, recommended regular thin liquid diet -Continue antibiotics as above -Will add on Tessalon Perles, Mucinex, flutter valve, incentive spiromtry  Acute renal failure -Renal ultrasound showed mild right pelvicaliectasis with ureteral jet noted and urinary bladder excluding significant ureteral obstruction -Creatinine upon admission 3.17, currently 1.06 -Continue IV fluids and monitor BMP  Hyponatremia -Sodium improving, continue IV fluids and monitor BMP  Polysubstance abuse -Recently injected Opana a couple of days ago. -Will discontinue Suboxone as patient having pain at this time -Will place on morphine and hydrocodone as needed  Neck pain -Status post lumbar puncture, CSF culture shows no growth to date -Treatment plan as above  Abnormal TSH -TSH 0.208 -FT4 pending   DVT Prophylaxis  Heparin  Code Status: Full  Family Communication: None at bedside   Disposition Plan: Admitted. Continue treatment and workup for possible endocarditis. Disposition to be determined.  Consultants Infectious disease  Procedures  Renal US  Antibiotics   Anti-infectives    Start     Dose/Rate Route Frequency Ordered Stop  07/19/16 2300  vancomycin (VANCOCIN) IVPB 750 mg/150 ml premix  Status:  Discontinued     750  mg 150 mL/hr over 60 Minutes Intravenous Every 24 hours 07/19/16 0237 07/19/16 0953   07/19/16 2200  ceFEPIme (MAXIPIME) 2 g in dextrose 5 % 50 mL IVPB     2 g 100 mL/hr over 30 Minutes Intravenous Every 24 hours 07/19/16 1931     07/19/16 2000  nafcillin 2 g in dextrose 5 % 100 mL IVPB     2 g 200 mL/hr over 30 Minutes Intravenous Every 4 hours 07/19/16 1915     07/19/16 1915  nafcillin injection 2 g  Status:  Discontinued     2 g Intravenous Every 4 hours 07/19/16 1912 07/19/16 1915   07/19/16 1800  ceFEPIme (MAXIPIME) 2 g in dextrose 5 % 50 mL IVPB  Status:  Discontinued     2 g 100 mL/hr over 30 Minutes Intravenous Every 24 hours 07/19/16 1646 07/19/16 1912   07/19/16 1100  vancomycin (VANCOCIN) 500 mg in sodium chloride 0.9 % 100 mL IVPB  Status:  Discontinued     500 mg 100 mL/hr over 60 Minutes Intravenous Every 12 hours 07/19/16 0953 07/19/16 1645   07/19/16 0800  piperacillin-tazobactam (ZOSYN) IVPB 3.375 g  Status:  Discontinued     3.375 g 12.5 mL/hr over 240 Minutes Intravenous Every 8 hours 07/19/16 0239 07/19/16 1645   07/19/16 0200  cefTRIAXone (ROCEPHIN) 2 g in dextrose 5 % 50 mL IVPB     2 g 100 mL/hr over 30 Minutes Intravenous  Once 07/19/16 0140 07/19/16 0259   07/19/16 0130  cefTRIAXone (ROCEPHIN) 1 g in dextrose 5 % 50 mL IVPB  Status:  Discontinued     1 g 100 mL/hr over 30 Minutes Intravenous  Once 07/19/16 0125 07/19/16 0140   07/19/16 0115  piperacillin-tazobactam (ZOSYN) IVPB 3.375 g     3.375 g 100 mL/hr over 30 Minutes Intravenous  Once 07/19/16 0109 07/19/16 0219   07/19/16 0115  vancomycin (VANCOCIN) IVPB 1000 mg/200 mL premix     1,000 mg 200 mL/hr over 60 Minutes Intravenous  Once 07/19/16 0109 07/19/16 0243      Subjective:   Dail Lerew seen and examined today.  Patient states he feeling mildly better today. Continues to have pain everywhere. Denies any current dizziness or headache, visual changes. Able to move his neck and had more.  Patient denies any current chest pain, shortness of breath. Endorses cough. Denies abdominal pain, nausea or vomiting. Feels lower extremity swelling and redness is improving.  Objective:   Vitals:   07/20/16 0900 07/20/16 0930 07/20/16 1141 07/20/16 1200  BP: 126/86  128/87 107/78  Pulse: (!) 102 (!) 107 95 95  Resp: (!) 24 (!) 22 (!) 25 (!) 29  Temp:   98.5 F (36.9 C)   TempSrc:   Oral   SpO2: 99% 98% 98% 99%  Weight:      Height:        Intake/Output Summary (Last 24 hours) at 07/20/16 1333 Last data filed at 07/20/16 1216  Gross per 24 hour  Intake           3870.5 ml  Output             1325 ml  Net           2545.5 ml   Filed Weights   07/19/16 0225 07/19/16 0525 07/20/16 0344  Weight: 68 kg (150 lb) 68.9 kg (152  lb) 64.4 kg (142 lb)   Exam  General: Well developed, ill-appearing, NAD, appears stated age  HEENT: NCAT, mucous membranes moist.   Neck: Supple, intact ROM  Cardiovascular: S1 S2 auscultated, no rubs, murmurs or gallops. Regular rate and rhythm.  Respiratory: Diminished breath sounds, positive oils, right-sided crackles, productive cough  Abdomen: Soft, nontender, nondistended, + bowel sounds  Extremities: warm dry without cyanosis clubbing. Lower extremity edema, right greater than left, improving. Erythema improving  Neuro: AAOx3, nonfocal  Skin: Dry. Erythema on lower extremity improving. Petechiae noted on left foot  Psych: appropriate   Data Reviewed: I have personally reviewed following labs and imaging studies  CBC:  Recent Labs Lab 07/18/16 2351 07/19/16 0548 07/20/16 0656  WBC 12.6* 9.6 7.6  NEUTROABS 9.5*  --   --   HGB 12.1* 11.0* 11.2*  HCT 35.7* 32.3* 32.2*  MCV 84.0 82.8 82.4  PLT 112* 101* 99*   Basic Metabolic Panel:  Recent Labs Lab 07/18/16 2351 07/19/16 0548 07/20/16 0656  NA 123* 129* 131*  K 4.1 3.2* 3.2*  CL 89* 100* 101  CO2 26 21* 24  GLUCOSE 160* 98 109*  BUN 26* 24* 18  CREATININE 3.17* 2.02*  1.06  CALCIUM 8.1* 6.8* 7.3*   GFR: Estimated Creatinine Clearance: 80.2 mL/min (by C-G formula based on SCr of 1.06 mg/dL). Liver Function Tests:  Recent Labs Lab 07/18/16 2351 07/19/16 0548  AST 47* 51*  ALT 25 27  ALKPHOS 206* 207*  BILITOT 1.6* 1.4*  PROT 6.6 5.6*  ALBUMIN 2.4* 1.8*   No results for input(s): LIPASE, AMYLASE in the last 168 hours. No results for input(s): AMMONIA in the last 168 hours. Coagulation Profile:  Recent Labs Lab 07/18/16 2351  INR 1.41   Cardiac Enzymes:  Recent Labs Lab 07/19/16 0548 07/19/16 1108 07/19/16 1902  TROPONINI <0.03 0.06* <0.03   BNP (last 3 results) No results for input(s): PROBNP in the last 8760 hours. HbA1C: No results for input(s): HGBA1C in the last 72 hours. CBG: No results for input(s): GLUCAP in the last 168 hours. Lipid Profile: No results for input(s): CHOL, HDL, LDLCALC, TRIG, CHOLHDL, LDLDIRECT in the last 72 hours. Thyroid Function Tests:  Recent Labs  07/19/16 0606  TSH 0.208*   Anemia Panel: No results for input(s): VITAMINB12, FOLATE, FERRITIN, TIBC, IRON, RETICCTPCT in the last 72 hours. Urine analysis:    Component Value Date/Time   COLORURINE AMBER (A) 07/18/2016 2255   APPEARANCEUR CLOUDY (A) 07/18/2016 2255   LABSPEC 1.018 07/18/2016 2255   PHURINE 5.0 07/18/2016 2255   GLUCOSEU NEGATIVE 07/18/2016 2255   HGBUR MODERATE (A) 07/18/2016 2255   BILIRUBINUR SMALL (A) 07/18/2016 2255   KETONESUR NEGATIVE 07/18/2016 2255   PROTEINUR 100 (A) 07/18/2016 2255   NITRITE NEGATIVE 07/18/2016 2255   LEUKOCYTESUR NEGATIVE 07/18/2016 2255   Sepsis Labs: '@LABRCNTIP'$ (procalcitonin:4,lacticidven:4)  ) Recent Results (from the past 240 hour(s))  Culture, blood (Routine x 2)     Status: Abnormal (Preliminary result)   Collection Time: 07/18/16 10:55 PM  Result Value Ref Range Status   Specimen Description BLOOD RIGHT ARM  Final   Special Requests IN PEDIATRIC BOTTLE Blood Culture adequate volume   Final   Culture  Setup Time   Final    GRAM POSITIVE COCCI IN CLUSTERS IN PEDIATRIC BOTTLE CRITICAL RESULT CALLED TO, READ BACK BY AND VERIFIED WITH: K HAMMONS,PHARMD AT 1619 07/19/16 BY L BENFIELD    Culture STAPHYLOCOCCUS AUREUS (A)  Final   Report Status PENDING  Incomplete  Blood Culture ID Panel (Reflexed)     Status: Abnormal   Collection Time: 07/18/16 10:55 PM  Result Value Ref Range Status   Enterococcus species NOT DETECTED NOT DETECTED Final   Listeria monocytogenes NOT DETECTED NOT DETECTED Final   Staphylococcus species DETECTED (A) NOT DETECTED Final    Comment: CRITICAL RESULT CALLED TO, READ BACK BY AND VERIFIED WITH: K HAMMONS, PHARMD AT 1619 07/19/16 BY L BENFIELD    Staphylococcus aureus DETECTED (A) NOT DETECTED Final    Comment: Methicillin (oxacillin) susceptible Staphylococcus aureus (MSSA). Preferred therapy is anti staphylococcal beta lactam antibiotic (Cefazolin or Nafcillin), unless clinically contraindicated. CRITICAL RESULT CALLED TO, READ BACK BY AND VERIFIED WITH: K HAMMONS,PHARMD AT 1619 07/19/16 BY L BENFIELD    Methicillin resistance NOT DETECTED NOT DETECTED Final   Streptococcus species NOT DETECTED NOT DETECTED Final   Streptococcus agalactiae NOT DETECTED NOT DETECTED Final   Streptococcus pneumoniae NOT DETECTED NOT DETECTED Final   Streptococcus pyogenes NOT DETECTED NOT DETECTED Final   Acinetobacter baumannii NOT DETECTED NOT DETECTED Final   Enterobacteriaceae species NOT DETECTED NOT DETECTED Final   Enterobacter cloacae complex NOT DETECTED NOT DETECTED Final   Escherichia coli NOT DETECTED NOT DETECTED Final   Klebsiella oxytoca NOT DETECTED NOT DETECTED Final   Klebsiella pneumoniae NOT DETECTED NOT DETECTED Final   Proteus species NOT DETECTED NOT DETECTED Final   Serratia marcescens NOT DETECTED NOT DETECTED Final   Carbapenem resistance NOT DETECTED NOT DETECTED Final   Haemophilus influenzae NOT DETECTED NOT DETECTED Final    Neisseria meningitidis NOT DETECTED NOT DETECTED Final   Pseudomonas aeruginosa DETECTED (A) NOT DETECTED Final    Comment: CRITICAL RESULT CALLED TO, READ BACK BY AND VERIFIED WITH: K HAMMONS,PHARMD AT 1619 07/19/16 BY L BENFIELD    Candida albicans NOT DETECTED NOT DETECTED Final   Candida glabrata NOT DETECTED NOT DETECTED Final   Candida krusei NOT DETECTED NOT DETECTED Final   Candida parapsilosis NOT DETECTED NOT DETECTED Final   Candida tropicalis NOT DETECTED NOT DETECTED Final  Culture, blood (Routine x 2)     Status: Abnormal (Preliminary result)   Collection Time: 07/18/16 11:45 PM  Result Value Ref Range Status   Specimen Description BLOOD LEFT ARM  Final   Special Requests   Final    BOTTLES DRAWN AEROBIC AND ANAEROBIC Blood Culture adequate volume   Culture  Setup Time   Final    GRAM POSITIVE COCCI IN CLUSTERS IN BOTH AEROBIC AND ANAEROBIC BOTTLES CRITICAL VALUE NOTED.  VALUE IS CONSISTENT WITH PREVIOUSLY REPORTED AND CALLED VALUE.    Culture (A)  Final    STAPHYLOCOCCUS AUREUS PSEUDOMONAS AERUGINOSA SUSCEPTIBILITIES TO FOLLOW    Report Status PENDING  Incomplete  CSF culture     Status: None (Preliminary result)   Collection Time: 07/19/16  4:07 AM  Result Value Ref Range Status   Specimen Description CSF  Final   Special Requests NONE  Final   Gram Stain   Final    CYTOSPIN SMEAR WBC PRESENT, PREDOMINANTLY PMN NO ORGANISMS SEEN    Culture NO GROWTH 1 DAY  Final   Report Status PENDING  Incomplete  MRSA PCR Screening     Status: None   Collection Time: 07/19/16  5:26 AM  Result Value Ref Range Status   MRSA by PCR NEGATIVE NEGATIVE Final    Comment:        The GeneXpert MRSA Assay (FDA approved for NASAL specimens only), is one component of  a comprehensive MRSA colonization surveillance program. It is not intended to diagnose MRSA infection nor to guide or monitor treatment for MRSA infections.   Respiratory Panel by PCR     Status: None    Collection Time: 07/19/16 12:19 PM  Result Value Ref Range Status   Adenovirus NOT DETECTED NOT DETECTED Final   Coronavirus 229E NOT DETECTED NOT DETECTED Final   Coronavirus HKU1 NOT DETECTED NOT DETECTED Final   Coronavirus NL63 NOT DETECTED NOT DETECTED Final   Coronavirus OC43 NOT DETECTED NOT DETECTED Final   Metapneumovirus NOT DETECTED NOT DETECTED Final   Rhinovirus / Enterovirus NOT DETECTED NOT DETECTED Final   Influenza A NOT DETECTED NOT DETECTED Final   Influenza B NOT DETECTED NOT DETECTED Final   Parainfluenza Virus 1 NOT DETECTED NOT DETECTED Final   Parainfluenza Virus 2 NOT DETECTED NOT DETECTED Final   Parainfluenza Virus 3 NOT DETECTED NOT DETECTED Final   Parainfluenza Virus 4 NOT DETECTED NOT DETECTED Final   Respiratory Syncytial Virus NOT DETECTED NOT DETECTED Final   Bordetella pertussis NOT DETECTED NOT DETECTED Final   Chlamydophila pneumoniae NOT DETECTED NOT DETECTED Final   Mycoplasma pneumoniae NOT DETECTED NOT DETECTED Final      Radiology Studies: US Renal  Result Date: 07/19/2016 CLINICAL DATA:  Acute renal failure EXAM: RENAL / URINARY TRACT ULTRASOUND COMPLETE COMPARISON:  CT 08/12/2010 FINDINGS: Right Kidney: Length: 12.2 cm. Parenchyma isoechoic to adjacent liver. No focal lesion. Mild pelvicaliectasis. Left Kidney: Length: 12.9 cm. 1.1 x 0.4 cm cortical cyst, midpole. No solid mass or hydronephrosis. Bladder: Distended.  Right ureteral jet documented. There is a small amount of abdominal ascites identified. Bilateral pleural effusions noted. Accessory splenule. IMPRESSION: 1. Mild right pelvicaliectasis, with ureteral jet noted in the urinary bladder excluding significant ureteral obstruction. 2. Small left renal cyst. 3. Pleural effusions and abdominal ascites. Electronically Signed   By: Lucrezia Europe M.D.   On: 07/19/2016 13:17   Dg Chest Port 1 View  Result Date: 07/19/2016 CLINICAL DATA:  Cellulitis. Worsening swelling and redness of both feet.  Intermittent fever. Code sepsis. EXAM: PORTABLE CHEST 1 VIEW COMPARISON:  Radiograph 06/05/2016 FINDINGS: Emphysema. Lung volumes from prior exam. Normal heart size and mediastinal contours for technique. There are streaky opacities in both lower lobes, with slightly more patchy right lung base opacity. Small right pleural effusion. No pneumothorax. No acute osseous abnormality. IMPRESSION: Emphysema. Streaky lower lobe opacities favoring atelectasis. Right lung base slightly more patchy which may reflect aspiration or pneumonia. Possible small right pleural effusion. Electronically Signed   By: Jeb Levering M.D.   On: 07/19/2016 01:53     Scheduled Meds: . benzonatate  200 mg Oral TID  . feeding supplement (PRO-STAT SUGAR FREE 64)  30 mL Oral BID  . guaiFENesin  600 mg Oral BID  . heparin  5,000 Units Subcutaneous Q8H  . pneumococcal 23 valent vaccine  0.5 mL Intramuscular Tomorrow-1000  . sodium chloride flush  3 mL Intravenous Q12H   Continuous Infusions: . sodium chloride 75 mL/hr at 07/19/16 2105  . ceFEPime (MAXIPIME) IV Stopped (07/19/16 2238)  . nafcillin IV Stopped (07/20/16 1216)     LOS: 1 day   Time Spent in minutes   45 minutes  Zooey Schreurs D.O. on 07/20/2016 at 1:33 PM  Between 7am to 7pm - Pager - [phone]  After 7pm go to [email or web address] - password [email or web address]  And look for the night coverage person covering for me after hours  [facility]  Office  [phone]

## 2016-07-20 NOTE — Progress Notes (Signed)
With patients permission, patients mother Stanton KidneyDebra given update on patients health status via phone.

## 2016-07-20 NOTE — Progress Notes (Signed)
MD notified of increase in Lactic Acid, patient is afebrile and already on IV fluids and antibiotics. No new orders at this time.

## 2016-07-21 ENCOUNTER — Encounter (HOSPITAL_COMMUNITY): Payer: Self-pay

## 2016-07-21 ENCOUNTER — Inpatient Hospital Stay (HOSPITAL_COMMUNITY): Payer: Medicaid Other

## 2016-07-21 DIAGNOSIS — E8809 Other disorders of plasma-protein metabolism, not elsewhere classified: Secondary | ICD-10-CM

## 2016-07-21 DIAGNOSIS — J151 Pneumonia due to Pseudomonas: Secondary | ICD-10-CM

## 2016-07-21 DIAGNOSIS — J15211 Pneumonia due to Methicillin susceptible Staphylococcus aureus: Secondary | ICD-10-CM

## 2016-07-21 DIAGNOSIS — I368 Other nonrheumatic tricuspid valve disorders: Secondary | ICD-10-CM

## 2016-07-21 DIAGNOSIS — K7689 Other specified diseases of liver: Secondary | ICD-10-CM

## 2016-07-21 DIAGNOSIS — E876 Hypokalemia: Secondary | ICD-10-CM

## 2016-07-21 DIAGNOSIS — I079 Rheumatic tricuspid valve disease, unspecified: Secondary | ICD-10-CM | POA: Diagnosis present

## 2016-07-21 LAB — COMPREHENSIVE METABOLIC PANEL
ALK PHOS: 219 U/L — AB (ref 38–126)
ALT: 29 U/L (ref 17–63)
AST: 28 U/L (ref 15–41)
Albumin: 1.5 g/dL — ABNORMAL LOW (ref 3.5–5.0)
Anion gap: 5 (ref 5–15)
BILIRUBIN TOTAL: 5.2 mg/dL — AB (ref 0.3–1.2)
BUN: 17 mg/dL (ref 6–20)
CALCIUM: 7 mg/dL — AB (ref 8.9–10.3)
CO2: 25 mmol/L (ref 22–32)
CREATININE: 1.07 mg/dL (ref 0.61–1.24)
Chloride: 100 mmol/L — ABNORMAL LOW (ref 101–111)
Glucose, Bld: 119 mg/dL — ABNORMAL HIGH (ref 65–99)
Potassium: 2.9 mmol/L — ABNORMAL LOW (ref 3.5–5.1)
Sodium: 130 mmol/L — ABNORMAL LOW (ref 135–145)
TOTAL PROTEIN: 6 g/dL — AB (ref 6.5–8.1)

## 2016-07-21 LAB — BLOOD CULTURE ID PANEL (REFLEXED)
ACINETOBACTER BAUMANNII: NOT DETECTED
CANDIDA KRUSEI: NOT DETECTED
CANDIDA PARAPSILOSIS: NOT DETECTED
Candida albicans: NOT DETECTED
Candida glabrata: NOT DETECTED
Candida tropicalis: NOT DETECTED
ENTEROCOCCUS SPECIES: NOT DETECTED
Enterobacter cloacae complex: NOT DETECTED
Enterobacteriaceae species: NOT DETECTED
Escherichia coli: NOT DETECTED
HAEMOPHILUS INFLUENZAE: NOT DETECTED
Klebsiella oxytoca: NOT DETECTED
Klebsiella pneumoniae: NOT DETECTED
LISTERIA MONOCYTOGENES: NOT DETECTED
METHICILLIN RESISTANCE: NOT DETECTED
Neisseria meningitidis: NOT DETECTED
PROTEUS SPECIES: NOT DETECTED
PSEUDOMONAS AERUGINOSA: NOT DETECTED
SERRATIA MARCESCENS: NOT DETECTED
STAPHYLOCOCCUS AUREUS BCID: DETECTED — AB
STAPHYLOCOCCUS SPECIES: DETECTED — AB
STREPTOCOCCUS AGALACTIAE: NOT DETECTED
STREPTOCOCCUS PNEUMONIAE: NOT DETECTED
Streptococcus pyogenes: NOT DETECTED
Streptococcus species: NOT DETECTED

## 2016-07-21 LAB — CULTURE, BLOOD (ROUTINE X 2)
SPECIAL REQUESTS: ADEQUATE
Special Requests: ADEQUATE

## 2016-07-21 LAB — URINE CULTURE

## 2016-07-21 LAB — CBC
HEMATOCRIT: 32.5 % — AB (ref 39.0–52.0)
Hemoglobin: 10.9 g/dL — ABNORMAL LOW (ref 13.0–17.0)
MCH: 27.7 pg (ref 26.0–34.0)
MCHC: 33.5 g/dL (ref 30.0–36.0)
MCV: 82.5 fL (ref 78.0–100.0)
PLATELETS: 116 10*3/uL — AB (ref 150–400)
RBC: 3.94 MIL/uL — AB (ref 4.22–5.81)
RDW: 14.6 % (ref 11.5–15.5)
WBC: 7.7 10*3/uL (ref 4.0–10.5)

## 2016-07-21 LAB — MAGNESIUM: Magnesium: 1.8 mg/dL (ref 1.7–2.4)

## 2016-07-21 MED ORDER — MORPHINE SULFATE (PF) 4 MG/ML IV SOLN
2.0000 mg | Freq: Once | INTRAVENOUS | Status: AC
  Administered 2016-07-21: 2 mg via INTRAVENOUS
  Filled 2016-07-21: qty 1

## 2016-07-21 MED ORDER — MORPHINE SULFATE (PF) 4 MG/ML IV SOLN: 2.0000 mg | Freq: Once | INTRAVENOUS | Status: DC

## 2016-07-21 MED ORDER — IOPAMIDOL (ISOVUE-300) INJECTION 61%
INTRAVENOUS | Status: AC
  Filled 2016-07-21: qty 75

## 2016-07-21 MED ORDER — HYDROCOD POLST-CPM POLST ER 10-8 MG/5ML PO SUER: 5.0000 mL | Freq: Every evening | ORAL | Status: DC | PRN

## 2016-07-21 MED ORDER — POTASSIUM CHLORIDE CRYS ER 20 MEQ PO TBCR: 40.0000 meq | EXTENDED_RELEASE_TABLET | Freq: Once | ORAL | Status: DC

## 2016-07-21 MED ORDER — POTASSIUM CHLORIDE CRYS ER 20 MEQ PO TBCR
40.0000 meq | EXTENDED_RELEASE_TABLET | Freq: Two times a day (BID) | ORAL | Status: AC
Start: 2016-07-21 — End: 2016-07-21
  Administered 2016-07-21 (×2): 40 meq via ORAL
  Filled 2016-07-21 (×2): qty 2

## 2016-07-21 NOTE — Progress Notes (Signed)
  PHARMACY - PHYSICIAN COMMUNICATION CRITICAL VALUE ALERT - BLOOD CULTURE IDENTIFICATION (BCID)  Results for orders placed or performed during the hospital encounter of 07/19/16  Blood Culture ID Panel (Reflexed) (Collected: 07/20/2016  7:03 AM)  Result Value Ref Range   Enterococcus species NOT DETECTED NOT DETECTED   Listeria monocytogenes NOT DETECTED NOT DETECTED   Staphylococcus species DETECTED (A) NOT DETECTED   Staphylococcus aureus DETECTED (A) NOT DETECTED   Methicillin resistance NOT DETECTED NOT DETECTED   Streptococcus species NOT DETECTED NOT DETECTED   Streptococcus agalactiae NOT DETECTED NOT DETECTED   Streptococcus pneumoniae NOT DETECTED NOT DETECTED   Streptococcus pyogenes NOT DETECTED NOT DETECTED   Acinetobacter baumannii NOT DETECTED NOT DETECTED   Enterobacteriaceae species NOT DETECTED NOT DETECTED   Enterobacter cloacae complex NOT DETECTED NOT DETECTED   Escherichia coli NOT DETECTED NOT DETECTED   Klebsiella oxytoca NOT DETECTED NOT DETECTED   Klebsiella pneumoniae NOT DETECTED NOT DETECTED   Proteus species NOT DETECTED NOT DETECTED   Serratia marcescens NOT DETECTED NOT DETECTED   Haemophilus influenzae NOT DETECTED NOT DETECTED   Neisseria meningitidis NOT DETECTED NOT DETECTED   Pseudomonas aeruginosa NOT DETECTED NOT DETECTED   Candida albicans NOT DETECTED NOT DETECTED   Candida glabrata NOT DETECTED NOT DETECTED   Candida krusei NOT DETECTED NOT DETECTED   Candida parapsilosis NOT DETECTED NOT DETECTED   Candida tropicalis NOT DETECTED NOT DETECTED    Name of physician (or Provider) Contacted: n/a  Blood cx growing MSSA. This is a repeat cx and it is still positive with MSSA. Pseudomonas was not detected this time.  Changes to prescribed antibiotics required: Continue nafcillin for MSSA and cefepime for pseudomonas  Enzo BiNathan Malaney Mcbean, PharmD, BCPS Clinical Pharmacist Pager [phone] 07/21/2016 5:14 AM

## 2016-07-21 NOTE — Progress Notes (Signed)
PROGRESS NOTE    Johnathan [name]  KGM:[id number] DOB: Mar 04, 1970 DOA: 07/19/2016 PCP: [name], MD   Chief Complaint  Patient presents with  . Cellulitis    Brief Narrative:  HPI On 07/19/2016 by Dr. [name] Johnathan [name]  is a 46 y.o. male, w Hep C?, IVDA, (recently injected opana) apparently c/o fever intermittently for the past 1 week, and redness over the dorsum of the right foot and slight swelling.  Pt notes injecting Opana about 2 days ago.  Pt denies any recent dental work. Pt presented to ED due to subjective fevers.  In ED,  Pt found to have mild cellulitis of the right > left foot as well as uti wbc 6-30.  Pt noted to be in ARF bun/creat 26/3.17,  And to have abnormal LFT, Ast 47, Alt 25, Alk phos 206, T. Bili 1.6, Alb 2.4.  And hyponatremia  Na 123  , CXR right lung base slightly more patchy may reflect aspiration or pneumonia.  Possible small right pleural effusion   Pt will be admitted for sepsis secondary to uti vs cellulitis and ARF and hyponatremia.  (note LP pending due to ? Neck stiffness)  Interim history Found to have MSSA bacteremia, possible meningitis, and LE cellulitis. Plan for echocardiogram and possibly TEE. ID consulted.  Assessment & Plan   Sepsis secondary to MSSA bacteremia-endocarditis/possible meningitis/pneumonia/cellulitis  -Patient presented with fever, tachycardia, tachypnea -Status post lumbar puncture: High WBC and protein, therefore meningitis suspected. -CSF culture shows no growth to one date, no organisms seen on Gram stain -Blood cultures 07/18/16 Staphylococcus aureus (2/2), Pseudomonas aeruginosa (however gram stain showed no GN) -Respiratory viral panel unremarkable  -Initially placed on vancomycin and Zosyn however given MSSA and Pseudomonas, patient placed on cefepime -Infectious disease consulted and appreciated, started nafcillin -Pending CT with and without contrast of the head (concern for septic  emboli) -EchocardiogramShowed EF 64-40%, grade 1 diastolic dysfunction, tricuspid valve with mobile vegetation -Will consult cardiology for TEE -Repeat blood cultures pending  ?Urinary tract infection -UA: Rare bacteria, 6-30 WBC, negative leukocytes and negative nitrites -patient thought to have a UTI on admission, however, no symptoms of UTI and UA unimpressive for infection -Urine culture showed multiple species  Cellulitis of the right lower extremity, foot -As above, was on vancomycin however has been transitioned to nafcillin and cefepime -Appears to be improving  Right lower lobe pneumonia versus aspiration pneumonia with cough -Chest x-ray showed emphysema, streaky lower lobe opacities favoring atelectasis. Right lung base slightly more patchy, may reflect aspiration or pneumonia -Speech consulted for possible aspiration, recommended regular thin liquid diet -Continue antibiotics as above -Continue Tessalon Perles, Mucinex, flutter valve, incentive spiromtry -Will add on Tussionex daily at bedtime as needed to aid with cough at night so the patient can rest  Acute renal failure -Renal ultrasound showed mild right pelvicaliectasis with ureteral jet noted and urinary bladder excluding significant ureteral obstruction -Creatinine upon admission 3.17, currently 1.07 -Continue IV fluids and monitor BMP   Hypokalemia -Will replace and continue to monitor BMP -Will obtain magnesium level  Hyponatremia -Sodium improving, continue IV fluids and monitor BMP  Polysubstance abuse -Recently injected Opana a couple of days ago. -Suboxone was discontinued as patient was having pain -Continue morphine and hydrocodone as needed  Neck pain -Status post lumbar puncture, CSF culture shows no growth to date -Treatment plan as above  Abnormal TSH -TSH 0.208, FT4 2.51 -given clinical illness, would not start treatment at this time -would repeat thyroid testing  in 3-4  weeks  Malnutrition/hypoalbuminemia  -will consult nutrition  DVT Prophylaxis  Heparin  Code Status: Full  Family Communication: None at bedside   Disposition Plan: Admitted. Continue treatment and workup for possible endocarditis. Disposition to be determined.  Consultants Infectious disease Cardiology   Procedures  Renal US Echocardiogram  Antibiotics   Anti-infectives    Start     Dose/Rate Route Frequency Ordered Stop   07/20/16 1500  ceFEPIme (MAXIPIME) 2 g in dextrose 5 % 50 mL IVPB     2 g 100 mL/hr over 30 Minutes Intravenous Every 12 hours 07/20/16 1410     07/19/16 2300  vancomycin (VANCOCIN) IVPB 750 mg/150 ml premix  Status:  Discontinued     750 mg 150 mL/hr over 60 Minutes Intravenous Every 24 hours 07/19/16 0237 07/19/16 0953   07/19/16 2200  ceFEPIme (MAXIPIME) 2 g in dextrose 5 % 50 mL IVPB  Status:  Discontinued     2 g 100 mL/hr over 30 Minutes Intravenous Every 24 hours 07/19/16 1931 07/20/16 1410   07/19/16 2000  nafcillin 2 g in dextrose 5 % 100 mL IVPB     2 g 200 mL/hr over 30 Minutes Intravenous Every 4 hours 07/19/16 1915     07/19/16 1915  nafcillin injection 2 g  Status:  Discontinued     2 g Intravenous Every 4 hours 07/19/16 1912 07/19/16 1915   07/19/16 1800  ceFEPIme (MAXIPIME) 2 g in dextrose 5 % 50 mL IVPB  Status:  Discontinued     2 g 100 mL/hr over 30 Minutes Intravenous Every 24 hours 07/19/16 1646 07/19/16 1912   07/19/16 1100  vancomycin (VANCOCIN) 500 mg in sodium chloride 0.9 % 100 mL IVPB  Status:  Discontinued     500 mg 100 mL/hr over 60 Minutes Intravenous Every 12 hours 07/19/16 0953 07/19/16 1645   07/19/16 0800  piperacillin-tazobactam (ZOSYN) IVPB 3.375 g  Status:  Discontinued     3.375 g 12.5 mL/hr over 240 Minutes Intravenous Every 8 hours 07/19/16 0239 07/19/16 1645   07/19/16 0200  cefTRIAXone (ROCEPHIN) 2 g in dextrose 5 % 50 mL IVPB     2 g 100 mL/hr over 30 Minutes Intravenous  Once 07/19/16 0140 07/19/16 0259    07/19/16 0130  cefTRIAXone (ROCEPHIN) 1 g in dextrose 5 % 50 mL IVPB  Status:  Discontinued     1 g 100 mL/hr over 30 Minutes Intravenous  Once 07/19/16 0125 07/19/16 0140   07/19/16 0115  piperacillin-tazobactam (ZOSYN) IVPB 3.375 g     3.375 g 100 mL/hr over 30 Minutes Intravenous  Once 07/19/16 0109 07/19/16 0219   07/19/16 0115  vancomycin (VANCOCIN) IVPB 1000 mg/200 mL premix     1,000 mg 200 mL/hr over 60 Minutes Intravenous  Once 07/19/16 0109 07/19/16 0243      Subjective:   Johnathan [name] seen and examined today.  Continues to have pain. Continues to cough with productive sputum. Feels Tessalon Perles help. Patient unable to sleep overnight due to cough. Currently denies chest pain, abdominal pain, nausea or vomiting, diarrhea or constipation. Feels leg swelling has improved. Objective:   Vitals:   07/20/16 1956 07/20/16 2300 07/21/16 0317 07/21/16 0451  BP: (!) 140/91 131/89 128/90   Pulse: (!) 102 94 86   Resp: 18 (!) 24 20   Temp: 98.2 F (36.8 C) 97.7 F (36.5 C) 98.2 F (36.8 C)   TempSrc: Oral Oral Oral   SpO2: 94% 94% 93%  Weight:    68 kg (150 lb)  Height:        Intake/Output Summary (Last 24 hours) at 07/21/16 1400 Last data filed at 07/21/16 0448  Gross per 24 hour  Intake             1920 ml  Output              975 ml  Net              945 ml   Filed Weights   07/19/16 0525 07/20/16 0344 07/21/16 0451  Weight: 68.9 kg (152 lb) 64.4 kg (142 lb) 68 kg (150 lb)   Exam  General: Well developed, well nourished, NAD, appears stated age  HEENT: NCAT, mucous membranes moist.   Cardiovascular: S1 S2 auscultated, RRR, no murmur  Respiratory: Diminished breath sounds, productive cough, Rales  Abdomen: Soft, nontender, nondistended, + bowel sounds  Extremities: warm dry without cyanosis clubbing. LE edema improving  Neuro: AAOx3, nonfocal  Skin: Multiple tattoos. Right LE erythema improving.  Psych: appropriate mood and affect  Data  Reviewed: I have personally reviewed following labs and imaging studies  CBC:  Recent Labs Lab 07/18/16 2351 07/19/16 0548 07/20/16 0656 07/21/16 0251  WBC 12.6* 9.6 7.6 7.7  NEUTROABS 9.5*  --   --   --   HGB 12.1* 11.0* 11.2* 10.9*  HCT 35.7* 32.3* 32.2* 32.5*  MCV 84.0 82.8 82.4 82.5  PLT 112* 101* 99* 027*   Basic Metabolic Panel:  Recent Labs Lab 07/18/16 2351 07/19/16 0548 07/20/16 0656 07/21/16 0251  NA 123* 129* 131* 130*  K 4.1 3.2* 3.2* 2.9*  CL 89* 100* 101 100*  CO2 26 21* 24 25  GLUCOSE 160* 98 109* 119*  BUN 26* 24* 18 17  CREATININE 3.17* 2.02* 1.06 1.07  CALCIUM 8.1* 6.8* 7.3* 7.0*   GFR: Estimated Creatinine Clearance: 83.9 mL/min (by C-G formula based on SCr of 1.07 mg/dL). Liver Function Tests:  Recent Labs Lab 07/18/16 2351 07/19/16 0548 07/21/16 0251  AST 47* 51* 28  ALT '25 27 29  ' ALKPHOS 206* 207* 219*  BILITOT 1.6* 1.4* 5.2*  PROT 6.6 5.6* 6.0*  ALBUMIN 2.4* 1.8* 1.5*   No results for input(s): LIPASE, AMYLASE in the last 168 hours. No results for input(s): AMMONIA in the last 168 hours. Coagulation Profile:  Recent Labs Lab 07/18/16 2351  INR 1.41   Cardiac Enzymes:  Recent Labs Lab 07/19/16 0548 07/19/16 1108 07/19/16 1902  TROPONINI <0.03 0.06* <0.03   BNP (last 3 results) No results for input(s): PROBNP in the last 8760 hours. HbA1C: No results for input(s): HGBA1C in the last 72 hours. CBG: No results for input(s): GLUCAP in the last 168 hours. Lipid Profile: No results for input(s): CHOL, HDL, LDLCALC, TRIG, CHOLHDL, LDLDIRECT in the last 72 hours. Thyroid Function Tests:  Recent Labs  07/19/16 0606 07/20/16 1400  TSH 0.208*  --   FREET4  --  2.51*   Anemia Panel: No results for input(s): VITAMINB12, FOLATE, FERRITIN, TIBC, IRON, RETICCTPCT in the last 72 hours. Urine analysis:    Component Value Date/Time   COLORURINE AMBER (A) 07/18/2016 2255   APPEARANCEUR CLOUDY (A) 07/18/2016 2255   LABSPEC  1.018 07/18/2016 2255   PHURINE 5.0 07/18/2016 2255   GLUCOSEU NEGATIVE 07/18/2016 2255   HGBUR MODERATE (A) 07/18/2016 2255   BILIRUBINUR SMALL (A) 07/18/2016 2255   KETONESUR NEGATIVE 07/18/2016 2255   PROTEINUR 100 (A) 07/18/2016 2255   NITRITE NEGATIVE  07/18/2016 2255   LEUKOCYTESUR NEGATIVE 07/18/2016 2255   Sepsis Labs: '@LABRCNTIP' (procalcitonin:4,lacticidven:4)  ) Recent Results (from the past 240 hour(s))  Culture, blood (Routine x 2)     Status: Abnormal   Collection Time: 07/18/16 10:55 PM  Result Value Ref Range Status   Specimen Description BLOOD RIGHT ARM  Final   Special Requests IN PEDIATRIC BOTTLE Blood Culture adequate volume  Final   Culture  Setup Time   Final    GRAM POSITIVE COCCI IN CLUSTERS IN PEDIATRIC BOTTLE CRITICAL RESULT CALLED TO, READ BACK BY AND VERIFIED WITH: K HAMMONS,PHARMD AT 1619 07/19/16 BY L BENFIELD    Culture (A)  Final    STAPHYLOCOCCUS AUREUS SUSCEPTIBILITIES PERFORMED ON PREVIOUS CULTURE WITHIN THE LAST 5 DAYS.    Report Status 07/21/2016 FINAL  Final  Blood Culture ID Panel (Reflexed)     Status: Abnormal   Collection Time: 07/18/16 10:55 PM  Result Value Ref Range Status   Enterococcus species NOT DETECTED NOT DETECTED Final   Listeria monocytogenes NOT DETECTED NOT DETECTED Final   Staphylococcus species DETECTED (A) NOT DETECTED Final    Comment: CRITICAL RESULT CALLED TO, READ BACK BY AND VERIFIED WITH: K HAMMONS, PHARMD AT 1619 07/19/16 BY L BENFIELD    Staphylococcus aureus DETECTED (A) NOT DETECTED Final    Comment: Methicillin (oxacillin) susceptible Staphylococcus aureus (MSSA). Preferred therapy is anti staphylococcal beta lactam antibiotic (Cefazolin or Nafcillin), unless clinically contraindicated. CRITICAL RESULT CALLED TO, READ BACK BY AND VERIFIED WITH: K HAMMONS,PHARMD AT 1619 07/19/16 BY L BENFIELD    Methicillin resistance NOT DETECTED NOT DETECTED Final   Streptococcus species NOT DETECTED NOT DETECTED Final    Streptococcus agalactiae NOT DETECTED NOT DETECTED Final   Streptococcus pneumoniae NOT DETECTED NOT DETECTED Final   Streptococcus pyogenes NOT DETECTED NOT DETECTED Final   Acinetobacter baumannii NOT DETECTED NOT DETECTED Final   Enterobacteriaceae species NOT DETECTED NOT DETECTED Final   Enterobacter cloacae complex NOT DETECTED NOT DETECTED Final   Escherichia coli NOT DETECTED NOT DETECTED Final   Klebsiella oxytoca NOT DETECTED NOT DETECTED Final   Klebsiella pneumoniae NOT DETECTED NOT DETECTED Final   Proteus species NOT DETECTED NOT DETECTED Final   Serratia marcescens NOT DETECTED NOT DETECTED Final   Carbapenem resistance NOT DETECTED NOT DETECTED Final   Haemophilus influenzae NOT DETECTED NOT DETECTED Final   Neisseria meningitidis NOT DETECTED NOT DETECTED Final   Pseudomonas aeruginosa DETECTED (A) NOT DETECTED Final    Comment: CRITICAL RESULT CALLED TO, READ BACK BY AND VERIFIED WITH: K HAMMONS,PHARMD AT 1619 07/19/16 BY L BENFIELD    Candida albicans NOT DETECTED NOT DETECTED Final   Candida glabrata NOT DETECTED NOT DETECTED Final   Candida krusei NOT DETECTED NOT DETECTED Final   Candida parapsilosis NOT DETECTED NOT DETECTED Final   Candida tropicalis NOT DETECTED NOT DETECTED Final  Culture, blood (Routine x 2)     Status: Abnormal   Collection Time: 07/18/16 11:45 PM  Result Value Ref Range Status   Specimen Description BLOOD LEFT ARM  Final   Special Requests   Final    BOTTLES DRAWN AEROBIC AND ANAEROBIC Blood Culture adequate volume   Culture  Setup Time   Final    GRAM POSITIVE COCCI IN CLUSTERS IN BOTH AEROBIC AND ANAEROBIC BOTTLES CRITICAL VALUE NOTED.  VALUE IS CONSISTENT WITH PREVIOUSLY REPORTED AND CALLED VALUE.    Culture STAPHYLOCOCCUS AUREUS PSEUDOMONAS AERUGINOSA  (A)  Final   Report Status 07/21/2016 FINAL  Final  Organism ID, Bacteria STAPHYLOCOCCUS AUREUS  Final   Organism ID, Bacteria PSEUDOMONAS AERUGINOSA  Final      Susceptibility     Pseudomonas aeruginosa - MIC*    CEFTAZIDIME 2 SENSITIVE Sensitive     CIPROFLOXACIN <=0.25 SENSITIVE Sensitive     GENTAMICIN <=1 SENSITIVE Sensitive     IMIPENEM 1 SENSITIVE Sensitive     PIP/TAZO 8 SENSITIVE Sensitive     CEFEPIME 2 SENSITIVE Sensitive     * PSEUDOMONAS AERUGINOSA   Staphylococcus aureus - MIC*    CIPROFLOXACIN <=0.5 SENSITIVE Sensitive     ERYTHROMYCIN 1 INTERMEDIATE Intermediate     GENTAMICIN <=0.5 SENSITIVE Sensitive     OXACILLIN <=0.25 SENSITIVE Sensitive     TETRACYCLINE <=1 SENSITIVE Sensitive     VANCOMYCIN 1 SENSITIVE Sensitive     TRIMETH/SULFA <=10 SENSITIVE Sensitive     CLINDAMYCIN RESISTANT Resistant     RIFAMPIN <=0.5 SENSITIVE Sensitive     Inducible Clindamycin POSITIVE Resistant     * STAPHYLOCOCCUS AUREUS  CSF culture     Status: None (Preliminary result)   Collection Time: 07/19/16  4:07 AM  Result Value Ref Range Status   Specimen Description CSF  Final   Special Requests NONE  Final   Gram Stain   Final    CYTOSPIN SMEAR WBC PRESENT, PREDOMINANTLY PMN NO ORGANISMS SEEN    Culture NO GROWTH 2 DAYS  Final   Report Status PENDING  Incomplete  MRSA PCR Screening     Status: None   Collection Time: 07/19/16  5:26 AM  Result Value Ref Range Status   MRSA by PCR NEGATIVE NEGATIVE Final    Comment:        The GeneXpert MRSA Assay (FDA approved for NASAL specimens only), is one component of a comprehensive MRSA colonization surveillance program. It is not intended to diagnose MRSA infection nor to guide or monitor treatment for MRSA infections.   Respiratory Panel by PCR     Status: None   Collection Time: 07/19/16 12:19 PM  Result Value Ref Range Status   Adenovirus NOT DETECTED NOT DETECTED Final   Coronavirus 229E NOT DETECTED NOT DETECTED Final   Coronavirus HKU1 NOT DETECTED NOT DETECTED Final   Coronavirus NL63 NOT DETECTED NOT DETECTED Final   Coronavirus OC43 NOT DETECTED NOT DETECTED Final   Metapneumovirus NOT  DETECTED NOT DETECTED Final   Rhinovirus / Enterovirus NOT DETECTED NOT DETECTED Final   Influenza A NOT DETECTED NOT DETECTED Final   Influenza B NOT DETECTED NOT DETECTED Final   Parainfluenza Virus 1 NOT DETECTED NOT DETECTED Final   Parainfluenza Virus 2 NOT DETECTED NOT DETECTED Final   Parainfluenza Virus 3 NOT DETECTED NOT DETECTED Final   Parainfluenza Virus 4 NOT DETECTED NOT DETECTED Final   Respiratory Syncytial Virus NOT DETECTED NOT DETECTED Final   Bordetella pertussis NOT DETECTED NOT DETECTED Final   Chlamydophila pneumoniae NOT DETECTED NOT DETECTED Final   Mycoplasma pneumoniae NOT DETECTED NOT DETECTED Final  Culture, Urine     Status: Abnormal   Collection Time: 07/19/16 10:38 PM  Result Value Ref Range Status   Specimen Description URINE, CLEAN CATCH  Final   Special Requests NONE  Final   Culture MULTIPLE SPECIES PRESENT, SUGGEST RECOLLECTION (A)  Final   Report Status 07/21/2016 FINAL  Final  Culture, blood (routine x 2)     Status: None (Preliminary result)   Collection Time: 07/20/16  7:03 AM  Result Value Ref Range Status  Specimen Description BLOOD RIGHT HAND  Final   Special Requests IN PEDIATRIC BOTTLE Blood Culture adequate volume  Final   Culture  Setup Time   Final    GRAM POSITIVE COCCI IN CLUSTERS IN PEDIATRIC BOTTLE CRITICAL RESULT CALLED TO, READ BACK BY AND VERIFIED WITH: TO JBATCHELDER(PHARMd) BY [facility] 07/21/2016 AT 4:57AM    Culture GRAM POSITIVE COCCI  Final   Report Status PENDING  Incomplete  Blood Culture ID Panel (Reflexed)     Status: Abnormal   Collection Time: 07/20/16  7:03 AM  Result Value Ref Range Status   Enterococcus species NOT DETECTED NOT DETECTED Final   Listeria monocytogenes NOT DETECTED NOT DETECTED Final   Staphylococcus species DETECTED (A) NOT DETECTED Final    Comment: CRITICAL RESULT CALLED TO, READ BACK BY AND VERIFIED WITH: TO JBATCHELDER(PHARMd) BY TC[facility]AND 07/21/2016 AT 4:53AM    Staphylococcus aureus  DETECTED (A) NOT DETECTED Final    Comment: Methicillin (oxacillin) susceptible Staphylococcus aureus (MSSA). Preferred therapy is anti staphylococcal beta lactam antibiotic (Cefazolin or Nafcillin), unless clinically contraindicated. CRITICAL RESULT CALLED TO, READ BACK BY AND VERIFIED WITH: TO JBATCHELDER(PHARMd) BY [facility] 07/21/2016 AT 4:53AM    Methicillin resistance NOT DETECTED NOT DETECTED Final   Streptococcus species NOT DETECTED NOT DETECTED Final   Streptococcus agalactiae NOT DETECTED NOT DETECTED Final   Streptococcus pneumoniae NOT DETECTED NOT DETECTED Final   Streptococcus pyogenes NOT DETECTED NOT DETECTED Final   Acinetobacter baumannii NOT DETECTED NOT DETECTED Final   Enterobacteriaceae species NOT DETECTED NOT DETECTED Final   Enterobacter cloacae complex NOT DETECTED NOT DETECTED Final   Escherichia coli NOT DETECTED NOT DETECTED Final   Klebsiella oxytoca NOT DETECTED NOT DETECTED Final   Klebsiella pneumoniae NOT DETECTED NOT DETECTED Final   Proteus species NOT DETECTED NOT DETECTED Final   Serratia marcescens NOT DETECTED NOT DETECTED Final   Haemophilus influenzae NOT DETECTED NOT DETECTED Final   Neisseria meningitidis NOT DETECTED NOT DETECTED Final   Pseudomonas aeruginosa NOT DETECTED NOT DETECTED Final   Candida albicans NOT DETECTED NOT DETECTED Final   Candida glabrata NOT DETECTED NOT DETECTED Final   Candida krusei NOT DETECTED NOT DETECTED Final   Candida parapsilosis NOT DETECTED NOT DETECTED Final   Candida tropicalis NOT DETECTED NOT DETECTED Final      Radiology Studies: No results found.   Scheduled Meds: . benzonatate  200 mg Oral TID  . feeding supplement (PRO-STAT SUGAR FREE 64)  30 mL Oral BID  . guaiFENesin  600 mg Oral BID  . heparin  5,000 Units Subcutaneous Q8H  . iopamidol      . pneumococcal 23 valent vaccine  0.5 mL Intramuscular Tomorrow-1000  . potassium chloride  40 mEq Oral BID  . sodium chloride flush  3 mL  Intravenous Q12H   Continuous Infusions: . ceFEPime (MAXIPIME) IV Stopped (07/21/16 0344)  . nafcillin IV Stopped (07/21/16 1357)     LOS: 2 days   Time Spent in minutes   45 minutes  Preslie Depasquale D.O. on 07/21/2016 at 2:00 PM  Between 7am to 7pm - Pager - [phone]  After 7pm go to [email or web address] - password [email or web address]  And look for the night coverage person covering for me after hours  [facility] Office  [phone]

## 2016-07-21 NOTE — Progress Notes (Signed)
Patient ID: Johnathan [name], male   DOB: [birth year], 46 y.o.   MRN: [id number]          [facility]  Date of Admission:  07/19/2016   Total days of antibiotics 4         ASSESSMENT: He has MSSA and Pseudomonas bacteremia complicated by tricuspid valve endocarditis, pneumonia and CNS infection. Yesterday's blood cultures remain positive for MSSA.  PLAN: Continue nafcillin and cefepime  Repeat blood cultures Head CT scan with contrast  Principal Problem:   MSSA bacteremia Active Problems:   Sepsis (HCC)   Meningitis   Pneumonia   Bacteremia due to Pseudomonas   Endocarditis of tricuspid valve   AKI (acute kidney injury) (HCC)   IVDU (intravenous drug user)   UTI (urinary tract infection)   ARF (acute renal failure) (HCC)   Hyponatremia   Abnormal liver function   SUBJECTIVE: His headache is back today.  Review of Systems: Review of Systems  Constitutional: Positive for malaise/fatigue. Negative for chills, diaphoresis and fever.  Respiratory: Positive for cough, sputum production and shortness of breath.   Cardiovascular: Positive for chest pain.  Gastrointestinal: Negative for abdominal pain, diarrhea, nausea and vomiting.  Musculoskeletal: Positive for joint pain.  Neurological: Positive for headaches.  Psychiatric/Behavioral: Positive for substance abuse.    No Known Allergies  OBJECTIVE: Vitals:   07/20/16 1956 07/20/16 2300 07/21/16 0317 07/21/16 0451  BP: (!) 140/91 131/89 128/90   Pulse: (!) 102 94 86   Resp: 18 (!) 24 20   Temp: 98.2 F (36.8 C) 97.7 F (36.5 C) 98.2 F (36.8 C)   TempSrc: Oral Oral Oral   SpO2: 94% 94% 93%   Weight:    150 lb (68 kg)  Height:       Body mass index is 20.92 kg/m.  Physical Exam  Constitutional:  He is a little more lethargic today. He does answer appropriately.  Eyes: Conjunctivae are normal.  Neck: Neck supple.  Cardiovascular: Normal rate and regular rhythm.   No murmur  heard. Pulmonary/Chest: Effort normal. He has no wheezes. He has rales.  Scattered right sided crackles  Abdominal: Soft. He exhibits no distension. There is no tenderness.  Musculoskeletal: Normal range of motion. He exhibits no edema or tenderness.  Skin:  Bilateral foot edema has improved. Faint petechiae over dorsum of left foot. Redness remains over the dorsum of the right foot. Some superficial desquamation.    Lab Results Lab Results  Component Value Date   WBC 7.7 07/21/2016   HGB 10.9 (L) 07/21/2016   HCT 32.5 (L) 07/21/2016   MCV 82.5 07/21/2016   PLT 116 (L) 07/21/2016    Lab Results  Component Value Date   CREATININE 1.07 07/21/2016   BUN 17 07/21/2016   NA 130 (L) 07/21/2016   K 2.9 (L) 07/21/2016   CL 100 (L) 07/21/2016   CO2 25 07/21/2016    Lab Results  Component Value Date   ALT 29 07/21/2016   AST 28 07/21/2016   ALKPHOS 219 (H) 07/21/2016   BILITOT 5.2 (H) 07/21/2016     Microbiology: Recent Results (from the past 240 hour(s))  Culture, blood (Routine x 2)     Status: Abnormal   Collection Time: 07/18/16 10:55 PM  Result Value Ref Range Status   Specimen Description BLOOD RIGHT ARM  Final   Special Requests IN PEDIATRIC BOTTLE Blood Culture adequate volume  Final   Culture  Setup Time   Final  GRAM POSITIVE COCCI IN CLUSTERS IN PEDIATRIC BOTTLE CRITICAL RESULT CALLED TO, READ BACK BY AND VERIFIED WITH: K HAMMONS,PHARMD AT 1619 07/19/16 BY L BENFIELD    Culture (A)  Final    STAPHYLOCOCCUS AUREUS SUSCEPTIBILITIES PERFORMED ON PREVIOUS CULTURE WITHIN THE LAST 5 DAYS.    Report Status 07/21/2016 FINAL  Final  Blood Culture ID Panel (Reflexed)     Status: Abnormal   Collection Time: 07/18/16 10:55 PM  Result Value Ref Range Status   Enterococcus species NOT DETECTED NOT DETECTED Final   Listeria monocytogenes NOT DETECTED NOT DETECTED Final   Staphylococcus species DETECTED (A) NOT DETECTED Final    Comment: CRITICAL RESULT CALLED TO, READ  BACK BY AND VERIFIED WITH: K HAMMONS, PHARMD AT 1619 07/19/16 BY L BENFIELD    Staphylococcus aureus DETECTED (A) NOT DETECTED Final    Comment: Methicillin (oxacillin) susceptible Staphylococcus aureus (MSSA). Preferred therapy is anti staphylococcal beta lactam antibiotic (Cefazolin or Nafcillin), unless clinically contraindicated. CRITICAL RESULT CALLED TO, READ BACK BY AND VERIFIED WITH: K HAMMONS,PHARMD AT 1619 07/19/16 BY L BENFIELD    Methicillin resistance NOT DETECTED NOT DETECTED Final   Streptococcus species NOT DETECTED NOT DETECTED Final   Streptococcus agalactiae NOT DETECTED NOT DETECTED Final   Streptococcus pneumoniae NOT DETECTED NOT DETECTED Final   Streptococcus pyogenes NOT DETECTED NOT DETECTED Final   Acinetobacter baumannii NOT DETECTED NOT DETECTED Final   Enterobacteriaceae species NOT DETECTED NOT DETECTED Final   Enterobacter cloacae complex NOT DETECTED NOT DETECTED Final   Escherichia coli NOT DETECTED NOT DETECTED Final   Klebsiella oxytoca NOT DETECTED NOT DETECTED Final   Klebsiella pneumoniae NOT DETECTED NOT DETECTED Final   Proteus species NOT DETECTED NOT DETECTED Final   Serratia marcescens NOT DETECTED NOT DETECTED Final   Carbapenem resistance NOT DETECTED NOT DETECTED Final   Haemophilus influenzae NOT DETECTED NOT DETECTED Final   Neisseria meningitidis NOT DETECTED NOT DETECTED Final   Pseudomonas aeruginosa DETECTED (A) NOT DETECTED Final    Comment: CRITICAL RESULT CALLED TO, READ BACK BY AND VERIFIED WITH: K HAMMONS,PHARMD AT 1619 07/19/16 BY L BENFIELD    Candida albicans NOT DETECTED NOT DETECTED Final   Candida glabrata NOT DETECTED NOT DETECTED Final   Candida krusei NOT DETECTED NOT DETECTED Final   Candida parapsilosis NOT DETECTED NOT DETECTED Final   Candida tropicalis NOT DETECTED NOT DETECTED Final  Culture, blood (Routine x 2)     Status: Abnormal   Collection Time: 07/18/16 11:45 PM  Result Value Ref Range Status   Specimen  Description BLOOD LEFT ARM  Final   Special Requests   Final    BOTTLES DRAWN AEROBIC AND ANAEROBIC Blood Culture adequate volume   Culture  Setup Time   Final    GRAM POSITIVE COCCI IN CLUSTERS IN BOTH AEROBIC AND ANAEROBIC BOTTLES CRITICAL VALUE NOTED.  VALUE IS CONSISTENT WITH PREVIOUSLY REPORTED AND CALLED VALUE.    Culture STAPHYLOCOCCUS AUREUS PSEUDOMONAS AERUGINOSA  (A)  Final   Report Status 07/21/2016 FINAL  Final   Organism ID, Bacteria STAPHYLOCOCCUS AUREUS  Final   Organism ID, Bacteria PSEUDOMONAS AERUGINOSA  Final      Susceptibility   Pseudomonas aeruginosa - MIC*    CEFTAZIDIME 2 SENSITIVE Sensitive     CIPROFLOXACIN <=0.25 SENSITIVE Sensitive     GENTAMICIN <=1 SENSITIVE Sensitive     IMIPENEM 1 SENSITIVE Sensitive     PIP/TAZO 8 SENSITIVE Sensitive     CEFEPIME 2 SENSITIVE Sensitive     *  PSEUDOMONAS AERUGINOSA   Staphylococcus aureus - MIC*    CIPROFLOXACIN <=0.5 SENSITIVE Sensitive     ERYTHROMYCIN 1 INTERMEDIATE Intermediate     GENTAMICIN <=0.5 SENSITIVE Sensitive     OXACILLIN <=0.25 SENSITIVE Sensitive     TETRACYCLINE <=1 SENSITIVE Sensitive     VANCOMYCIN 1 SENSITIVE Sensitive     TRIMETH/SULFA <=10 SENSITIVE Sensitive     CLINDAMYCIN RESISTANT Resistant     RIFAMPIN <=0.5 SENSITIVE Sensitive     Inducible Clindamycin POSITIVE Resistant     * STAPHYLOCOCCUS AUREUS  CSF culture     Status: None (Preliminary result)   Collection Time: 07/19/16  4:07 AM  Result Value Ref Range Status   Specimen Description CSF  Final   Special Requests NONE  Final   Gram Stain   Final    CYTOSPIN SMEAR WBC PRESENT, PREDOMINANTLY PMN NO ORGANISMS SEEN    Culture NO GROWTH 2 DAYS  Final   Report Status PENDING  Incomplete  MRSA PCR Screening     Status: None   Collection Time: 07/19/16  5:26 AM  Result Value Ref Range Status   MRSA by PCR NEGATIVE NEGATIVE Final    Comment:        The GeneXpert MRSA Assay (FDA approved for NASAL specimens only), is one  component of a comprehensive MRSA colonization surveillance program. It is not intended to diagnose MRSA infection nor to guide or monitor treatment for MRSA infections.   Respiratory Panel by PCR     Status: None   Collection Time: 07/19/16 12:19 PM  Result Value Ref Range Status   Adenovirus NOT DETECTED NOT DETECTED Final   Coronavirus 229E NOT DETECTED NOT DETECTED Final   Coronavirus HKU1 NOT DETECTED NOT DETECTED Final   Coronavirus NL63 NOT DETECTED NOT DETECTED Final   Coronavirus OC43 NOT DETECTED NOT DETECTED Final   Metapneumovirus NOT DETECTED NOT DETECTED Final   Rhinovirus / Enterovirus NOT DETECTED NOT DETECTED Final   Influenza A NOT DETECTED NOT DETECTED Final   Influenza B NOT DETECTED NOT DETECTED Final   Parainfluenza Virus 1 NOT DETECTED NOT DETECTED Final   Parainfluenza Virus 2 NOT DETECTED NOT DETECTED Final   Parainfluenza Virus 3 NOT DETECTED NOT DETECTED Final   Parainfluenza Virus 4 NOT DETECTED NOT DETECTED Final   Respiratory Syncytial Virus NOT DETECTED NOT DETECTED Final   Bordetella pertussis NOT DETECTED NOT DETECTED Final   Chlamydophila pneumoniae NOT DETECTED NOT DETECTED Final   Mycoplasma pneumoniae NOT DETECTED NOT DETECTED Final  Culture, Urine     Status: Abnormal   Collection Time: 07/19/16 10:38 PM  Result Value Ref Range Status   Specimen Description URINE, CLEAN CATCH  Final   Special Requests NONE  Final   Culture MULTIPLE SPECIES PRESENT, SUGGEST RECOLLECTION (A)  Final   Report Status 07/21/2016 FINAL  Final  Culture, blood (routine x 2)     Status: None (Preliminary result)   Collection Time: 07/20/16  7:03 AM  Result Value Ref Range Status   Specimen Description BLOOD RIGHT HAND  Final   Special Requests IN PEDIATRIC BOTTLE Blood Culture adequate volume  Final   Culture  Setup Time   Final    GRAM POSITIVE COCCI IN CLUSTERS IN PEDIATRIC BOTTLE CRITICAL RESULT CALLED TO, READ BACK BY AND VERIFIED WITH: TO JBATCHELDER(PHARMd)  BY [facility] 07/21/2016 AT 4:57AM    Culture GRAM POSITIVE COCCI  Final   Report Status PENDING  Incomplete  Blood Culture ID Panel (Reflexed)  Status: Abnormal   Collection Time: 07/20/16  7:03 AM  Result Value Ref Range Status   Enterococcus species NOT DETECTED NOT DETECTED Final   Listeria monocytogenes NOT DETECTED NOT DETECTED Final   Staphylococcus species DETECTED (A) NOT DETECTED Final    Comment: CRITICAL RESULT CALLED TO, READ BACK BY AND VERIFIED WITH: TO JBATCHELDER(PHARMd) BY [place] 07/21/2016 AT 4:53AM    Staphylococcus aureus DETECTED (A) NOT DETECTED Final    Comment: Methicillin (oxacillin) susceptible Staphylococcus aureus (MSSA). Preferred therapy is anti staphylococcal beta lactam antibiotic (Cefazolin or Nafcillin), unless clinically contraindicated. CRITICAL RESULT CALLED TO, READ BACK BY AND VERIFIED WITH: TO JBATCHELDER(PHARMd) BY [facility] 07/21/2016 AT 4:53AM    Methicillin resistance NOT DETECTED NOT DETECTED Final   Streptococcus species NOT DETECTED NOT DETECTED Final   Streptococcus agalactiae NOT DETECTED NOT DETECTED Final   Streptococcus pneumoniae NOT DETECTED NOT DETECTED Final   Streptococcus pyogenes NOT DETECTED NOT DETECTED Final   Acinetobacter baumannii NOT DETECTED NOT DETECTED Final   Enterobacteriaceae species NOT DETECTED NOT DETECTED Final   Enterobacter cloacae complex NOT DETECTED NOT DETECTED Final   Escherichia coli NOT DETECTED NOT DETECTED Final   Klebsiella oxytoca NOT DETECTED NOT DETECTED Final   Klebsiella pneumoniae NOT DETECTED NOT DETECTED Final   Proteus species NOT DETECTED NOT DETECTED Final   Serratia marcescens NOT DETECTED NOT DETECTED Final   Haemophilus influenzae NOT DETECTED NOT DETECTED Final   Neisseria meningitidis NOT DETECTED NOT DETECTED Final   Pseudomonas aeruginosa NOT DETECTED NOT DETECTED Final   Candida albicans NOT DETECTED NOT DETECTED Final   Candida glabrata NOT DETECTED NOT DETECTED Final     Candida krusei NOT DETECTED NOT DETECTED Final   Candida parapsilosis NOT DETECTED NOT DETECTED Final   Candida tropicalis NOT DETECTED NOT DETECTED Final    Cliffton Asters, MD [facility] [phone] pager   [phone] cell 07/21/2016, 12:52 PM

## 2016-07-22 ENCOUNTER — Inpatient Hospital Stay (HOSPITAL_COMMUNITY): Payer: Medicaid Other

## 2016-07-22 ENCOUNTER — Encounter (HOSPITAL_COMMUNITY): Payer: Self-pay | Admitting: Certified Registered Nurse Anesthetist

## 2016-07-22 DIAGNOSIS — Z5181 Encounter for therapeutic drug level monitoring: Secondary | ICD-10-CM

## 2016-07-22 DIAGNOSIS — I330 Acute and subacute infective endocarditis: Secondary | ICD-10-CM

## 2016-07-22 DIAGNOSIS — B965 Pseudomonas (aeruginosa) (mallei) (pseudomallei) as the cause of diseases classified elsewhere: Secondary | ICD-10-CM

## 2016-07-22 DIAGNOSIS — G039 Meningitis, unspecified: Secondary | ICD-10-CM

## 2016-07-22 DIAGNOSIS — B9561 Methicillin susceptible Staphylococcus aureus infection as the cause of diseases classified elsewhere: Secondary | ICD-10-CM

## 2016-07-22 LAB — COMPREHENSIVE METABOLIC PANEL
ALBUMIN: 1.7 g/dL — AB (ref 3.5–5.0)
ALK PHOS: 203 U/L — AB (ref 38–126)
ALT: 28 U/L (ref 17–63)
ANION GAP: 6 (ref 5–15)
AST: 26 U/L (ref 15–41)
BUN: 16 mg/dL (ref 6–20)
CO2: 26 mmol/L (ref 22–32)
Calcium: 7.1 mg/dL — ABNORMAL LOW (ref 8.9–10.3)
Chloride: 100 mmol/L — ABNORMAL LOW (ref 101–111)
Creatinine, Ser: 1.01 mg/dL (ref 0.61–1.24)
GFR calc Af Amer: >60 mL/min (ref >60)
GFR calc non Af Amer: >60 mL/min (ref >60)
Glucose, Bld: 106 mg/dL — ABNORMAL HIGH (ref 65–99)
POTASSIUM: 3.6 mmol/L (ref 3.5–5.1)
SODIUM: 132 mmol/L — AB (ref 135–145)
Total Bilirubin: 5.6 mg/dL — ABNORMAL HIGH (ref 0.3–1.2)
Total Protein: 6.8 g/dL (ref 6.5–8.1)

## 2016-07-22 LAB — CBC
HCT: 35.7 % — ABNORMAL LOW (ref 39.0–52.0)
HEMOGLOBIN: 12.3 g/dL — AB (ref 13.0–17.0)
MCH: 28.3 pg (ref 26.0–34.0)
MCHC: 34.5 g/dL (ref 30.0–36.0)
MCV: 82.1 fL (ref 78.0–100.0)
Platelets: 142 10*3/uL — ABNORMAL LOW (ref 150–400)
RBC: 4.35 MIL/uL (ref 4.22–5.81)
RDW: 14.9 % (ref 11.5–15.5)
WBC: 9.1 10*3/uL (ref 4.0–10.5)

## 2016-07-22 LAB — CSF CULTURE: CULTURE: NO GROWTH

## 2016-07-22 LAB — CSF CULTURE W GRAM STAIN

## 2016-07-22 LAB — MAGNESIUM: Magnesium: 1.9 mg/dL (ref 1.7–2.4)

## 2016-07-22 MED ORDER — DEXTROSE 5 % IV SOLN
2.0000 g | Freq: Three times a day (TID) | INTRAVENOUS | Status: DC
  Administered 2016-07-22 – 2016-08-09 (×54): 2 g via INTRAVENOUS
  Filled 2016-07-22 (×60): qty 2

## 2016-07-22 MED ORDER — SODIUM CHLORIDE 0.9 % IV BOLUS (SEPSIS)
500.0000 mL | Freq: Once | INTRAVENOUS | Status: AC
  Administered 2016-07-22: 500 mL via INTRAVENOUS

## 2016-07-22 MED ORDER — IOPAMIDOL (ISOVUE-300) INJECTION 61%
INTRAVENOUS | Status: AC
  Administered 2016-07-22: 75 mL
  Filled 2016-07-22: qty 100

## 2016-07-22 MED ORDER — LORAZEPAM 2 MG/ML IJ SOLN
2.0000 mg | INTRAMUSCULAR | Status: DC | PRN
  Administered 2016-07-22: 3 mg via INTRAVENOUS
  Administered 2016-07-22 – 2016-07-23 (×5): 2 mg via INTRAVENOUS
  Administered 2016-07-23: 3 mg via INTRAVENOUS
  Filled 2016-07-22: qty 2
  Filled 2016-07-22: qty 1
  Filled 2016-07-22: qty 2
  Filled 2016-07-22 (×2): qty 1
  Filled 2016-07-22: qty 2
  Filled 2016-07-22 (×2): qty 1

## 2016-07-22 NOTE — Progress Notes (Signed)
PROGRESS NOTE    Johnathan [name]  RJJ:[id number] DOB: 15-Jun-1970 DOA: 07/19/2016 PCP: [name], MD   Chief Complaint  Patient presents with  . Cellulitis    Brief Narrative:  HPI On 07/19/2016 by Dr. [name] Johnathan [name]  is a 46 y.o. male, w Hep C?, IVDA, (recently injected opana) apparently c/o fever intermittently for the past 1 week, and redness over the dorsum of the right foot and slight swelling.  Pt notes injecting Opana about 2 days ago.  Pt denies any recent dental work. Pt presented to ED due to subjective fevers.  In ED,  Pt found to have mild cellulitis of the right > left foot as well as uti wbc 6-30.  Pt noted to be in ARF bun/creat 26/3.17,  And to have abnormal LFT, Ast 47, Alt 25, Alk phos 206, T. Bili 1.6, Alb 2.4.  And hyponatremia  Na 123  , CXR right lung base slightly more patchy may reflect aspiration or pneumonia.  Possible small right pleural effusion   Pt will be admitted for sepsis secondary to uti vs cellulitis and ARF and hyponatremia.  (note LP pending due to ? Neck stiffness)  Interim history Found to have MSSA bacteremia, possible meningitis, and LE cellulitis. Plan for echocardiogram and possibly TEE. ID consulted.  Assessment & Plan   Sepsis secondary to MSSA bacteremia-endocarditis/possible meningitis/pneumonia/cellulitis  -Patient presented with fever, tachycardia, tachypnea -Status post lumbar puncture: High WBC and protein, therefore meningitis suspected. -CSF culture shows no growth to one date, no organisms seen on Gram stain -Blood cultures 07/18/16 Staphylococcus aureus (2/2), Pseudomonas aeruginosa (however gram stain showed no GN) -Respiratory viral panel unremarkable  -Initially placed on vancomycin and Zosyn however given MSSA and Pseudomonas, patient placed on cefepime -Infectious disease consulted and appreciated, started nafcillin, however now discontinued -Pending CT with and without contrast of the head (concern for  septic emboli)- patient has refused twice -EchocardiogramShowed EF 01-60%, grade 1 diastolic dysfunction, tricuspid valve with mobile vegetation -Cardiology consulted for TEE, however, patient being difficult and cannot obtain consent, will reassess on 07/23/16  -Repeat Blood cultures from 07/20/16: 1/2 MSSA  ?Urinary tract infection -UA: Rare bacteria, 6-30 WBC, negative leukocytes and negative nitrites -patient thought to have a UTI on admission, however, no symptoms of UTI and UA unimpressive for infection -Urine culture showed multiple species  Cellulitis of the right lower extremity, foot -As above, was on vancomycin however has been transitioned to nafcillin and cefepime -Appears to be improving  Right lower lobe pneumonia versus aspiration pneumonia with cough -Chest x-ray showed emphysema, streaky lower lobe opacities favoring atelectasis. Right lung base slightly more patchy, may reflect aspiration or pneumonia -Speech consulted for possible aspiration, recommended regular thin liquid diet -Continue antibiotics as above -Continue Tessalon Perles, Mucinex, flutter valve, incentive spirometry, tussionex QHS PRN  Acute renal failure -Renal ultrasound showed mild right pelvicaliectasis with ureteral jet noted and urinary bladder excluding significant ureteral obstruction -Creatinine upon admission 3.17, currently 1.01 -Continue IV fluids and monitor BMP   Hypokalemia -resolved, continue to monitor BMP  Hyponatremia -Sodium improving, continue IV fluids and monitor BMP  Polysubstance abuse -Recently injected Opana a couple of days ago. -Suboxone was discontinued as patient was having pain -Continue morphine and hydrocodone as needed -Overnight, patient became belligerent. Placed him on CIWA protocol  Neck pain -Status post lumbar puncture, CSF culture shows no growth to date -Treatment plan as above  Abnormal TSH -TSH 0.208, FT4 2.51 -given clinical illness, would not  start treatment at this time -would repeat thyroid testing in 3-4 weeks  Malnutrition/hypoalbuminemia  -Nutrition consulted  DVT Prophylaxis  Heparin  Code Status: Full  Family Communication: None at bedside   Disposition Plan: Admitted. Continue treatment and workup for possible endocarditis. Disposition to be determined.  Consultants Infectious disease Cardiology   Procedures  Renal US Echocardiogram  Antibiotics   Anti-infectives    Start     Dose/Rate Route Frequency Ordered Stop   07/22/16 1400  ceFEPIme (MAXIPIME) 2 g in dextrose 5 % 50 mL IVPB     2 g 100 mL/hr over 30 Minutes Intravenous Every 8 hours 07/22/16 1122     07/20/16 1500  ceFEPIme (MAXIPIME) 2 g in dextrose 5 % 50 mL IVPB  Status:  Discontinued     2 g 100 mL/hr over 30 Minutes Intravenous Every 12 hours 07/20/16 1410 07/22/16 1122   07/19/16 2300  vancomycin (VANCOCIN) IVPB 750 mg/150 ml premix  Status:  Discontinued     750 mg 150 mL/hr over 60 Minutes Intravenous Every 24 hours 07/19/16 0237 07/19/16 0953   07/19/16 2200  ceFEPIme (MAXIPIME) 2 g in dextrose 5 % 50 mL IVPB  Status:  Discontinued     2 g 100 mL/hr over 30 Minutes Intravenous Every 24 hours 07/19/16 1931 07/20/16 1410   07/19/16 2000  nafcillin 2 g in dextrose 5 % 100 mL IVPB  Status:  Discontinued     2 g 200 mL/hr over 30 Minutes Intravenous Every 4 hours 07/19/16 1915 07/22/16 1130   07/19/16 1915  nafcillin injection 2 g  Status:  Discontinued     2 g Intravenous Every 4 hours 07/19/16 1912 07/19/16 1915   07/19/16 1800  ceFEPIme (MAXIPIME) 2 g in dextrose 5 % 50 mL IVPB  Status:  Discontinued     2 g 100 mL/hr over 30 Minutes Intravenous Every 24 hours 07/19/16 1646 07/19/16 1912   07/19/16 1100  vancomycin (VANCOCIN) 500 mg in sodium chloride 0.9 % 100 mL IVPB  Status:  Discontinued     500 mg 100 mL/hr over 60 Minutes Intravenous Every 12 hours 07/19/16 0953 07/19/16 1645   07/19/16 0800  piperacillin-tazobactam (ZOSYN) IVPB  3.375 g  Status:  Discontinued     3.375 g 12.5 mL/hr over 240 Minutes Intravenous Every 8 hours 07/19/16 0239 07/19/16 1645   07/19/16 0200  cefTRIAXone (ROCEPHIN) 2 g in dextrose 5 % 50 mL IVPB     2 g 100 mL/hr over 30 Minutes Intravenous  Once 07/19/16 0140 07/19/16 0259   07/19/16 0130  cefTRIAXone (ROCEPHIN) 1 g in dextrose 5 % 50 mL IVPB  Status:  Discontinued     1 g 100 mL/hr over 30 Minutes Intravenous  Once 07/19/16 0125 07/19/16 0140   07/19/16 0115  piperacillin-tazobactam (ZOSYN) IVPB 3.375 g     3.375 g 100 mL/hr over 30 Minutes Intravenous  Once 07/19/16 0109 07/19/16 0219   07/19/16 0115  vancomycin (VANCOCIN) IVPB 1000 mg/200 mL premix     1,000 mg 200 mL/hr over 60 Minutes Intravenous  Once 07/19/16 0109 07/19/16 0243      Subjective:   Sandip Power seen and examined today.  Overnight, patient became belligerent, was using profanities and yelling at nursing. Patient pulled out his IV as well as pulled his telemetry leads off.  Today, patient still complains of pain however cannot specify. Patient not very conversant this morning.  Objective:   Vitals:   07/22/16 0400 07/22/16 0500  07/22/16 0739 07/22/16 1121  BP:   (!) 138/92 (!) 146/87  Pulse: 78  (!) 101 96  Resp: (!) 30 (!) 26 19 (!) 23  Temp:   98.2 F (36.8 C) 98.2 F (36.8 C)  TempSrc:   Oral Oral  SpO2: (!) 82%  96% 96%  Weight:      Height:        Intake/Output Summary (Last 24 hours) at 07/22/16 1158 Last data filed at 07/22/16 0933  Gross per 24 hour  Intake              863 ml  Output                0 ml  Net              863 ml   Filed Weights   07/20/16 0344 07/21/16 0451 07/22/16 0336  Weight: 64.4 kg (142 lb) 68 kg (150 lb) 70.8 kg (156 lb)   Exam  General: Well developed, thin, uncooperative, no apparent distress  HEENT: NCAT, mucous membranes moist.   Cardiovascular: S1 S2 auscultated, no rubs, murmurs or gallops. Regular rate and rhythm.  Respiratory: Diminished  breath sounds  Abdomen: Soft, nontender, nondistended, + bowel sounds  Extremities: warm dry without cyanosis clubbing. Lower extremity edema improving. Right lower extremity erythema improving  Neuro: Awake, alert, oriented to place and self. Does not answer questions, closes eyes and turns had the opposite way.  Psych: cantankerous  Data Reviewed: I have personally reviewed following labs and imaging studies  CBC:  Recent Labs Lab 07/18/16 2351 07/19/16 0548 07/20/16 0656 07/21/16 0251 07/22/16 0144  WBC 12.6* 9.6 7.6 7.7 9.1  NEUTROABS 9.5*  --   --   --   --   HGB 12.1* 11.0* 11.2* 10.9* 12.3*  HCT 35.7* 32.3* 32.2* 32.5* 35.7*  MCV 84.0 82.8 82.4 82.5 82.1  PLT 112* 101* 99* 116* 338*   Basic Metabolic Panel:  Recent Labs Lab 07/18/16 2351 07/19/16 0548 07/20/16 0656 07/21/16 0251 07/21/16 1435 07/22/16 0144  NA 123* 129* 131* 130*  --  132*  K 4.1 3.2* 3.2* 2.9*  --  3.6  CL 89* 100* 101 100*  --  100*  CO2 26 21* 24 25  --  26  GLUCOSE 160* 98 109* 119*  --  106*  BUN 26* 24* 18 17  --  16  CREATININE 3.17* 2.02* 1.06 1.07  --  1.01  CALCIUM 8.1* 6.8* 7.3* 7.0*  --  7.1*  MG  --   --   --   --  1.8 1.9   GFR: Estimated Creatinine Clearance: 92.5 mL/min (by C-G formula based on SCr of 1.01 mg/dL). Liver Function Tests:  Recent Labs Lab 07/18/16 2351 07/19/16 0548 07/21/16 0251 07/22/16 0144  AST 47* 51* 28 26  ALT _0 ALKPHOS 206* 207* 219* 203*  BILITOT 1.6* 1.4* 5.2* 5.6*  PROT 6.6 5.6* 6.0* 6.8  ALBUMIN 2.4* 1.8* 1.5* 1.7*   No results for input(s): LIPASE, AMYLASE in the last 168 hours. No results for input(s): AMMONIA in the last 168 hours. Coagulation Profile:  Recent Labs Lab 07/18/16 2351  INR 1.41   Cardiac Enzymes:  Recent Labs Lab 07/19/16 0548 07/19/16 1108 07/19/16 1902  TROPONINI <0.03 0.06* <0.03   BNP (last 3 results) No results for input(s): PROBNP in the last 8760 hours. HbA1C: No results for  input(s): HGBA1C in the last 72 hours. CBG: No results  for input(s): GLUCAP in the last 168 hours. Lipid Profile: No results for input(s): CHOL, HDL, LDLCALC, TRIG, CHOLHDL, LDLDIRECT in the last 72 hours. Thyroid Function Tests:  Recent Labs  07/20/16 1400  FREET4 2.51*   Anemia Panel: No results for input(s): VITAMINB12, FOLATE, FERRITIN, TIBC, IRON, RETICCTPCT in the last 72 hours. Urine analysis:    Component Value Date/Time   COLORURINE AMBER (A) 07/18/2016 2255   APPEARANCEUR CLOUDY (A) 07/18/2016 2255   LABSPEC 1.018 07/18/2016 2255   PHURINE 5.0 07/18/2016 2255   GLUCOSEU NEGATIVE 07/18/2016 2255   HGBUR MODERATE (A) 07/18/2016 2255   BILIRUBINUR SMALL (A) 07/18/2016 2255   KETONESUR NEGATIVE 07/18/2016 2255   PROTEINUR 100 (A) 07/18/2016 2255   NITRITE NEGATIVE 07/18/2016 2255   LEUKOCYTESUR NEGATIVE 07/18/2016 2255   Sepsis Labs: _0 (procalcitonin:4,lacticidven:4)  ) Recent Results (from the past 240 hour(s))  Culture, blood (Routine x 2)     Status: Abnormal   Collection Time: 07/18/16 10:55 PM  Result Value Ref Range Status   Specimen Description BLOOD RIGHT ARM  Final   Special Requests IN PEDIATRIC BOTTLE Blood Culture adequate volume  Final   Culture  Setup Time   Final    GRAM POSITIVE COCCI IN CLUSTERS IN PEDIATRIC BOTTLE CRITICAL RESULT CALLED TO, READ BACK BY AND VERIFIED WITH: K HAMMONS,PHARMD AT 1619 07/19/16 BY L BENFIELD    Culture (A)  Final    STAPHYLOCOCCUS AUREUS SUSCEPTIBILITIES PERFORMED ON PREVIOUS CULTURE WITHIN THE LAST 5 DAYS.    Report Status 07/21/2016 FINAL  Final  Blood Culture ID Panel (Reflexed)     Status: Abnormal   Collection Time: 07/18/16 10:55 PM  Result Value Ref Range Status   Enterococcus species NOT DETECTED NOT DETECTED Final   Listeria monocytogenes NOT DETECTED NOT DETECTED Final   Staphylococcus species DETECTED (A) NOT DETECTED Final    Comment: CRITICAL RESULT CALLED TO, READ BACK BY AND VERIFIED  WITH: K HAMMONS, PHARMD AT 1619 07/19/16 BY L BENFIELD    Staphylococcus aureus DETECTED (A) NOT DETECTED Final    Comment: Methicillin (oxacillin) susceptible Staphylococcus aureus (MSSA). Preferred therapy is anti staphylococcal beta lactam antibiotic (Cefazolin or Nafcillin), unless clinically contraindicated. CRITICAL RESULT CALLED TO, READ BACK BY AND VERIFIED WITH: K HAMMONS,PHARMD AT 1619 07/19/16 BY L BENFIELD    Methicillin resistance NOT DETECTED NOT DETECTED Final   Streptococcus species NOT DETECTED NOT DETECTED Final   Streptococcus agalactiae NOT DETECTED NOT DETECTED Final   Streptococcus pneumoniae NOT DETECTED NOT DETECTED Final   Streptococcus pyogenes NOT DETECTED NOT DETECTED Final   Acinetobacter baumannii NOT DETECTED NOT DETECTED Final   Enterobacteriaceae species NOT DETECTED NOT DETECTED Final   Enterobacter cloacae complex NOT DETECTED NOT DETECTED Final   Escherichia coli NOT DETECTED NOT DETECTED Final   Klebsiella oxytoca NOT DETECTED NOT DETECTED Final   Klebsiella pneumoniae NOT DETECTED NOT DETECTED Final   Proteus species NOT DETECTED NOT DETECTED Final   Serratia marcescens NOT DETECTED NOT DETECTED Final   Carbapenem resistance NOT DETECTED NOT DETECTED Final   Haemophilus influenzae NOT DETECTED NOT DETECTED Final   Neisseria meningitidis NOT DETECTED NOT DETECTED Final   Pseudomonas aeruginosa DETECTED (A) NOT DETECTED Final    Comment: CRITICAL RESULT CALLED TO, READ BACK BY AND VERIFIED WITH: K HAMMONS,PHARMD AT 1619 07/19/16 BY L BENFIELD    Candida albicans NOT DETECTED NOT DETECTED Final   Candida glabrata NOT DETECTED NOT DETECTED Final   Candida krusei NOT DETECTED NOT DETECTED Final  Candida parapsilosis NOT DETECTED NOT DETECTED Final   Candida tropicalis NOT DETECTED NOT DETECTED Final  Culture, blood (Routine x 2)     Status: Abnormal   Collection Time: 07/18/16 11:45 PM  Result Value Ref Range Status   Specimen Description BLOOD LEFT  ARM  Final   Special Requests   Final    BOTTLES DRAWN AEROBIC AND ANAEROBIC Blood Culture adequate volume   Culture  Setup Time   Final    GRAM POSITIVE COCCI IN CLUSTERS IN BOTH AEROBIC AND ANAEROBIC BOTTLES CRITICAL VALUE NOTED.  VALUE IS CONSISTENT WITH PREVIOUSLY REPORTED AND CALLED VALUE.    Culture STAPHYLOCOCCUS AUREUS PSEUDOMONAS AERUGINOSA  (A)  Final   Report Status 07/21/2016 FINAL  Final   Organism ID, Bacteria STAPHYLOCOCCUS AUREUS  Final   Organism ID, Bacteria PSEUDOMONAS AERUGINOSA  Final      Susceptibility   Pseudomonas aeruginosa - MIC*    CEFTAZIDIME 2 SENSITIVE Sensitive     CIPROFLOXACIN <=0.25 SENSITIVE Sensitive     GENTAMICIN <=1 SENSITIVE Sensitive     IMIPENEM 1 SENSITIVE Sensitive     PIP/TAZO 8 SENSITIVE Sensitive     CEFEPIME 2 SENSITIVE Sensitive     * PSEUDOMONAS AERUGINOSA   Staphylococcus aureus - MIC*    CIPROFLOXACIN <=0.5 SENSITIVE Sensitive     ERYTHROMYCIN 1 INTERMEDIATE Intermediate     GENTAMICIN <=0.5 SENSITIVE Sensitive     OXACILLIN <=0.25 SENSITIVE Sensitive     TETRACYCLINE <=1 SENSITIVE Sensitive     VANCOMYCIN 1 SENSITIVE Sensitive     TRIMETH/SULFA <=10 SENSITIVE Sensitive     CLINDAMYCIN RESISTANT Resistant     RIFAMPIN <=0.5 SENSITIVE Sensitive     Inducible Clindamycin POSITIVE Resistant     * STAPHYLOCOCCUS AUREUS  CSF culture     Status: None   Collection Time: 07/19/16  4:07 AM  Result Value Ref Range Status   Specimen Description CSF  Final   Special Requests NONE  Final   Gram Stain   Final    CYTOSPIN SMEAR WBC PRESENT, PREDOMINANTLY PMN NO ORGANISMS SEEN    Culture NO GROWTH 3 DAYS  Final   Report Status 07/22/2016 FINAL  Final  MRSA PCR Screening     Status: None   Collection Time: 07/19/16  5:26 AM  Result Value Ref Range Status   MRSA by PCR NEGATIVE NEGATIVE Final    Comment:        The GeneXpert MRSA Assay (FDA approved for NASAL specimens only), is one component of a comprehensive MRSA  colonization surveillance program. It is not intended to diagnose MRSA infection nor to guide or monitor treatment for MRSA infections.   Respiratory Panel by PCR     Status: None   Collection Time: 07/19/16 12:19 PM  Result Value Ref Range Status   Adenovirus NOT DETECTED NOT DETECTED Final   Coronavirus 229E NOT DETECTED NOT DETECTED Final   Coronavirus HKU1 NOT DETECTED NOT DETECTED Final   Coronavirus NL63 NOT DETECTED NOT DETECTED Final   Coronavirus OC43 NOT DETECTED NOT DETECTED Final   Metapneumovirus NOT DETECTED NOT DETECTED Final   Rhinovirus / Enterovirus NOT DETECTED NOT DETECTED Final   Influenza A NOT DETECTED NOT DETECTED Final   Influenza B NOT DETECTED NOT DETECTED Final   Parainfluenza Virus 1 NOT DETECTED NOT DETECTED Final   Parainfluenza Virus 2 NOT DETECTED NOT DETECTED Final   Parainfluenza Virus 3 NOT DETECTED NOT DETECTED Final   Parainfluenza Virus 4 NOT DETECTED NOT DETECTED  Final   Respiratory Syncytial Virus NOT DETECTED NOT DETECTED Final   Bordetella pertussis NOT DETECTED NOT DETECTED Final   Chlamydophila pneumoniae NOT DETECTED NOT DETECTED Final   Mycoplasma pneumoniae NOT DETECTED NOT DETECTED Final  Culture, Urine     Status: Abnormal   Collection Time: 07/19/16 10:38 PM  Result Value Ref Range Status   Specimen Description URINE, CLEAN CATCH  Final   Special Requests NONE  Final   Culture MULTIPLE SPECIES PRESENT, SUGGEST RECOLLECTION (A)  Final   Report Status 07/21/2016 FINAL  Final  Culture, blood (routine x 2)     Status: None (Preliminary result)   Collection Time: 07/20/16  7:02 AM  Result Value Ref Range Status   Specimen Description BLOOD LEFT HAND  Final   Special Requests   Final    BOTTLES DRAWN AEROBIC ONLY Blood Culture adequate volume   Culture NO GROWTH 1 DAY  Final   Report Status PENDING  Incomplete  Culture, blood (routine x 2)     Status: Abnormal (Preliminary result)   Collection Time: 07/20/16  7:03 AM  Result  Value Ref Range Status   Specimen Description BLOOD RIGHT HAND  Final   Special Requests IN PEDIATRIC BOTTLE Blood Culture adequate volume  Final   Culture  Setup Time   Final    GRAM POSITIVE COCCI IN CLUSTERS IN PEDIATRIC BOTTLE CRITICAL RESULT CALLED TO, READ BACK BY AND VERIFIED WITH: TO JBATCHELDER(PHARMd) BY TCLEVELAND 07/21/2016 AT 4:57AM    Culture (A)  Final    STAPHYLOCOCCUS AUREUS SUSCEPTIBILITIES PERFORMED ON PREVIOUS CULTURE WITHIN THE LAST 5 DAYS.    Report Status PENDING  Incomplete  Blood Culture ID Panel (Reflexed)     Status: Abnormal   Collection Time: 07/20/16  7:03 AM  Result Value Ref Range Status   Enterococcus species NOT DETECTED NOT DETECTED Final   Listeria monocytogenes NOT DETECTED NOT DETECTED Final   Staphylococcus species DETECTED (A) NOT DETECTED Final    Comment: CRITICAL RESULT CALLED TO, READ BACK BY AND VERIFIED WITH: TO JBATCHELDER(PHARMd) BY TCLEVELAND 07/21/2016 AT 4:53AM    Staphylococcus aureus DETECTED (A) NOT DETECTED Final    Comment: Methicillin (oxacillin) susceptible Staphylococcus aureus (MSSA). Preferred therapy is anti staphylococcal beta lactam antibiotic (Cefazolin or Nafcillin), unless clinically contraindicated. CRITICAL RESULT CALLED TO, READ BACK BY AND VERIFIED WITH: TO JBATCHELDER(PHARMd) BY [facility] 07/21/2016 AT 4:53AM    Methicillin resistance NOT DETECTED NOT DETECTED Final   Streptococcus species NOT DETECTED NOT DETECTED Final   Streptococcus agalactiae NOT DETECTED NOT DETECTED Final   Streptococcus pneumoniae NOT DETECTED NOT DETECTED Final   Streptococcus pyogenes NOT DETECTED NOT DETECTED Final   Acinetobacter baumannii NOT DETECTED NOT DETECTED Final   Enterobacteriaceae species NOT DETECTED NOT DETECTED Final   Enterobacter cloacae complex NOT DETECTED NOT DETECTED Final   Escherichia coli NOT DETECTED NOT DETECTED Final   Klebsiella oxytoca NOT DETECTED NOT DETECTED Final   Klebsiella pneumoniae NOT DETECTED  NOT DETECTED Final   Proteus species NOT DETECTED NOT DETECTED Final   Serratia marcescens NOT DETECTED NOT DETECTED Final   Haemophilus influenzae NOT DETECTED NOT DETECTED Final   Neisseria meningitidis NOT DETECTED NOT DETECTED Final   Pseudomonas aeruginosa NOT DETECTED NOT DETECTED Final   Candida albicans NOT DETECTED NOT DETECTED Final   Candida glabrata NOT DETECTED NOT DETECTED Final   Candida krusei NOT DETECTED NOT DETECTED Final   Candida parapsilosis NOT DETECTED NOT DETECTED Final   Candida tropicalis NOT DETECTED NOT DETECTED  Final      Radiology Studies: No results found.   Scheduled Meds: . benzonatate  200 mg Oral TID  . feeding supplement (PRO-STAT SUGAR FREE 64)  30 mL Oral BID  . guaiFENesin  600 mg Oral BID  . heparin  5,000 Units Subcutaneous Q8H  . pneumococcal 23 valent vaccine  0.5 mL Intramuscular Tomorrow-1000  . sodium chloride flush  3 mL Intravenous Q12H   Continuous Infusions: . ceFEPime (MAXIPIME) IV       LOS: 3 days   Time Spent in minutes   45 minutes  Keeya Dyckman D.O. on 07/22/2016 at 11:58 AM  Between 7am to 7pm - Pager - [phone]  After 7pm go to [email or web address] - password [email or web address]  And look for the night coverage person covering for me after hours  [facility] Office  [phone]

## 2016-07-22 NOTE — Progress Notes (Signed)
Initial Nutrition Assessment  DOCUMENTATION CODES:   Non-severe (moderate) malnutrition in context of chronic illness  INTERVENTION:    When diet advanced, start Ensure Enlive po TID, each supplement provides 350 kcal and 20 grams of protein  NUTRITION DIAGNOSIS:   Malnutrition (Moderate) related to chronic illness (polysubstance abuse) as evidenced by moderate depletion of body fat, moderate depletions of muscle mass, moderate to severe fluid accumulation.  GOAL:   Patient will meet greater than or equal to 90% of their needs  MONITOR:   Diet advancement, PO intake, Supplement acceptance, Labs, I & O's  REASON FOR ASSESSMENT:   Consult Assessment of nutrition requirement/status  ASSESSMENT:   46 yo male with PMH of substance abuse, tobacco use, IV drug abuse, who was admitted on 7/13 with MSSA bacteremia, endocarditis, PNA, cellulitis of RLE.  Nutrition-Focused physical exam completed. Findings are moderate fat depletion, moderate muscle depletion, and moderate edema.  Patient unable to provide any nutrition history at this time.  Currently NPO for procedures, CT head and TEE. Patient on a regular diet since admission until today with poor intake, mostly 0% meal completion, consumed 50-100% of 2 meals. Labs reviewed sodium: 132 (L) Medications reviewed.  Diet Order:  Diet NPO time specified  Skin:  Reviewed, no issues  Last BM:  7/14  Height:   Ht Readings from Last 1 Encounters:  07/19/16 5\' 11"  (1.803 m)    Weight:   Wt Readings from Last 1 Encounters:  07/22/16 156 lb (70.8 kg)    Ideal Body Weight:  78.2 kg  BMI:  Body mass index is 21.76 kg/m.  Estimated Nutritional Needs:   Kcal:  2100-2300  Protein:  100-115 gm  Fluid:  2.1 L  EDUCATION NEEDS:   No education needs identified at this time  Joaquin CourtsKimberly Harris, RD, LDN, CNSC Pager [phone] After Hours Pager [phone]

## 2016-07-22 NOTE — Progress Notes (Addendum)
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, CNS infection  Interval History: repeat blood culture sent yesterday; afebrile and wbc wnl; complains of not feeling well;  CT head ordered  Cefepime day 4 Nafcillin day 4  Physical Exam: Constitutional:  Vitals:   07/22/16 0500 07/22/16 0739  BP:  (!) 138/92  Pulse:  (!) 101  Resp: (!) 26 19  Temp:  98.2 F (36.8 C)   patient appears in NAD, lying in bed, alert Eyes: anicteric HENT: no thrush Respiratory: Normal respiratory effort; CTA B Cardiovascular: RRR GI: soft, nt, nd  Review of Systems: Constitutional: negative for chills, diffuse pain Respiratory: negative for cough or sputum Gastrointestinal: negative for diarrhea  Lab Results  Component Value Date   WBC 9.1 07/22/2016   HGB 12.3 (L) 07/22/2016   HCT 35.7 (L) 07/22/2016   MCV 82.1 07/22/2016   PLT 142 (L) 07/22/2016    Lab Results  Component Value Date   CREATININE 1.01 07/22/2016   BUN 16 07/22/2016   NA 132 (L) 07/22/2016   K 3.6 07/22/2016   CL 100 (L) 07/22/2016   CO2 26 07/22/2016    Lab Results  Component Value Date   ALT 28 07/22/2016   AST 26 07/22/2016   ALKPHOS 203 (H) 07/22/2016     Microbiology: Recent Results (from the past 240 hour(s))  Culture, blood (Routine x 2)     Status: Abnormal   Collection Time: 07/18/16 10:55 PM  Result Value Ref Range Status   Specimen Description BLOOD RIGHT ARM  Final   Special Requests IN PEDIATRIC BOTTLE Blood Culture adequate volume  Final   Culture  Setup Time   Final    GRAM POSITIVE COCCI IN CLUSTERS IN PEDIATRIC BOTTLE CRITICAL RESULT CALLED TO, READ BACK BY AND VERIFIED WITH: K HAMMONS,PHARMD AT 1619 07/19/16 BY L BENFIELD    Culture (A)  Final    STAPHYLOCOCCUS AUREUS SUSCEPTIBILITIES PERFORMED ON PREVIOUS CULTURE WITHIN THE LAST 5 DAYS.    Report Status 07/21/2016 FINAL  Final  Blood Culture ID Panel  (Reflexed)     Status: Abnormal   Collection Time: 07/18/16 10:55 PM  Result Value Ref Range Status   Enterococcus species NOT DETECTED NOT DETECTED Final   Listeria monocytogenes NOT DETECTED NOT DETECTED Final   Staphylococcus species DETECTED (A) NOT DETECTED Final    Comment: CRITICAL RESULT CALLED TO, READ BACK BY AND VERIFIED WITH: K HAMMONS, PHARMD AT 1619 07/19/16 BY L BENFIELD    Staphylococcus aureus DETECTED (A) NOT DETECTED Final    Comment: Methicillin (oxacillin) susceptible Staphylococcus aureus (MSSA). Preferred therapy is anti staphylococcal beta lactam antibiotic (Cefazolin or Nafcillin), unless clinically contraindicated. CRITICAL RESULT CALLED TO, READ BACK BY AND VERIFIED WITH: K HAMMONS,PHARMD AT 1619 07/19/16 BY L BENFIELD    Methicillin resistance NOT DETECTED NOT DETECTED Final   Streptococcus species NOT DETECTED NOT DETECTED Final   Streptococcus agalactiae NOT DETECTED NOT DETECTED Final   Streptococcus pneumoniae NOT DETECTED NOT DETECTED Final   Streptococcus pyogenes NOT DETECTED NOT DETECTED Final   Acinetobacter baumannii NOT DETECTED NOT DETECTED Final   Enterobacteriaceae species NOT DETECTED NOT DETECTED Final   Enterobacter cloacae complex NOT DETECTED NOT DETECTED Final   Escherichia coli NOT DETECTED NOT DETECTED Final   Klebsiella oxytoca NOT DETECTED NOT DETECTED Final   Klebsiella pneumoniae NOT DETECTED NOT DETECTED Final   Proteus species NOT DETECTED NOT DETECTED Final  Serratia marcescens NOT DETECTED NOT DETECTED Final   Carbapenem resistance NOT DETECTED NOT DETECTED Final   Haemophilus influenzae NOT DETECTED NOT DETECTED Final   Neisseria meningitidis NOT DETECTED NOT DETECTED Final   Pseudomonas aeruginosa DETECTED (A) NOT DETECTED Final    Comment: CRITICAL RESULT CALLED TO, READ BACK BY AND VERIFIED WITH: K HAMMONS,PHARMD AT 1619 07/19/16 BY L BENFIELD    Candida albicans NOT DETECTED NOT DETECTED Final   Candida glabrata NOT  DETECTED NOT DETECTED Final   Candida krusei NOT DETECTED NOT DETECTED Final   Candida parapsilosis NOT DETECTED NOT DETECTED Final   Candida tropicalis NOT DETECTED NOT DETECTED Final  Culture, blood (Routine x 2)     Status: Abnormal   Collection Time: 07/18/16 11:45 PM  Result Value Ref Range Status   Specimen Description BLOOD LEFT ARM  Final   Special Requests   Final    BOTTLES DRAWN AEROBIC AND ANAEROBIC Blood Culture adequate volume   Culture  Setup Time   Final    GRAM POSITIVE COCCI IN CLUSTERS IN BOTH AEROBIC AND ANAEROBIC BOTTLES CRITICAL VALUE NOTED.  VALUE IS CONSISTENT WITH PREVIOUSLY REPORTED AND CALLED VALUE.    Culture STAPHYLOCOCCUS AUREUS PSEUDOMONAS AERUGINOSA  (A)  Final   Report Status 07/21/2016 FINAL  Final   Organism ID, Bacteria STAPHYLOCOCCUS AUREUS  Final   Organism ID, Bacteria PSEUDOMONAS AERUGINOSA  Final      Susceptibility   Pseudomonas aeruginosa - MIC*    CEFTAZIDIME 2 SENSITIVE Sensitive     CIPROFLOXACIN <=0.25 SENSITIVE Sensitive     GENTAMICIN <=1 SENSITIVE Sensitive     IMIPENEM 1 SENSITIVE Sensitive     PIP/TAZO 8 SENSITIVE Sensitive     CEFEPIME 2 SENSITIVE Sensitive     * PSEUDOMONAS AERUGINOSA   Staphylococcus aureus - MIC*    CIPROFLOXACIN <=0.5 SENSITIVE Sensitive     ERYTHROMYCIN 1 INTERMEDIATE Intermediate     GENTAMICIN <=0.5 SENSITIVE Sensitive     OXACILLIN <=0.25 SENSITIVE Sensitive     TETRACYCLINE <=1 SENSITIVE Sensitive     VANCOMYCIN 1 SENSITIVE Sensitive     TRIMETH/SULFA <=10 SENSITIVE Sensitive     CLINDAMYCIN RESISTANT Resistant     RIFAMPIN <=0.5 SENSITIVE Sensitive     Inducible Clindamycin POSITIVE Resistant     * STAPHYLOCOCCUS AUREUS  CSF culture     Status: None   Collection Time: 07/19/16  4:07 AM  Result Value Ref Range Status   Specimen Description CSF  Final   Special Requests NONE  Final   Gram Stain   Final    CYTOSPIN SMEAR WBC PRESENT, PREDOMINANTLY PMN NO ORGANISMS SEEN    Culture NO GROWTH  3 DAYS  Final   Report Status 07/22/2016 FINAL  Final  MRSA PCR Screening     Status: None   Collection Time: 07/19/16  5:26 AM  Result Value Ref Range Status   MRSA by PCR NEGATIVE NEGATIVE Final    Comment:        The GeneXpert MRSA Assay (FDA approved for NASAL specimens only), is one component of a comprehensive MRSA colonization surveillance program. It is not intended to diagnose MRSA infection nor to guide or monitor treatment for MRSA infections.   Respiratory Panel by PCR     Status: None   Collection Time: 07/19/16 12:19 PM  Result Value Ref Range Status   Adenovirus NOT DETECTED NOT DETECTED Final   Coronavirus 229E NOT DETECTED NOT DETECTED Final   Coronavirus HKU1 NOT DETECTED NOT  DETECTED Final   Coronavirus NL63 NOT DETECTED NOT DETECTED Final   Coronavirus OC43 NOT DETECTED NOT DETECTED Final   Metapneumovirus NOT DETECTED NOT DETECTED Final   Rhinovirus / Enterovirus NOT DETECTED NOT DETECTED Final   Influenza A NOT DETECTED NOT DETECTED Final   Influenza B NOT DETECTED NOT DETECTED Final   Parainfluenza Virus 1 NOT DETECTED NOT DETECTED Final   Parainfluenza Virus 2 NOT DETECTED NOT DETECTED Final   Parainfluenza Virus 3 NOT DETECTED NOT DETECTED Final   Parainfluenza Virus 4 NOT DETECTED NOT DETECTED Final   Respiratory Syncytial Virus NOT DETECTED NOT DETECTED Final   Bordetella pertussis NOT DETECTED NOT DETECTED Final   Chlamydophila pneumoniae NOT DETECTED NOT DETECTED Final   Mycoplasma pneumoniae NOT DETECTED NOT DETECTED Final  Culture, Urine     Status: Abnormal   Collection Time: 07/19/16 10:38 PM  Result Value Ref Range Status   Specimen Description URINE, CLEAN CATCH  Final   Special Requests NONE  Final   Culture MULTIPLE SPECIES PRESENT, SUGGEST RECOLLECTION (A)  Final   Report Status 07/21/2016 FINAL  Final  Culture, blood (routine x 2)     Status: None (Preliminary result)   Collection Time: 07/20/16  7:02 AM  Result Value Ref Range  Status   Specimen Description BLOOD LEFT HAND  Final   Special Requests   Final    BOTTLES DRAWN AEROBIC ONLY Blood Culture adequate volume   Culture NO GROWTH 1 DAY  Final   Report Status PENDING  Incomplete  Culture, blood (routine x 2)     Status: Abnormal (Preliminary result)   Collection Time: 07/20/16  7:03 AM  Result Value Ref Range Status   Specimen Description BLOOD RIGHT HAND  Final   Special Requests IN PEDIATRIC BOTTLE Blood Culture adequate volume  Final   Culture  Setup Time   Final    GRAM POSITIVE COCCI IN CLUSTERS IN PEDIATRIC BOTTLE CRITICAL RESULT CALLED TO, READ BACK BY AND VERIFIED WITH: TO JBATCHELDER(PHARMd) BY TCLEVELAND 07/21/2016 AT 4:57AM    Culture (A)  Final    STAPHYLOCOCCUS AUREUS SUSCEPTIBILITIES PERFORMED ON PREVIOUS CULTURE WITHIN THE LAST 5 DAYS.    Report Status PENDING  Incomplete  Blood Culture ID Panel (Reflexed)     Status: Abnormal   Collection Time: 07/20/16  7:03 AM  Result Value Ref Range Status   Enterococcus species NOT DETECTED NOT DETECTED Final   Listeria monocytogenes NOT DETECTED NOT DETECTED Final   Staphylococcus species DETECTED (A) NOT DETECTED Final    Comment: CRITICAL RESULT CALLED TO, READ BACK BY AND VERIFIED WITH: TO JBATCHELDER(PHARMd) BY TCLEVELAND 07/21/2016 AT 4:53AM    Staphylococcus aureus DETECTED (A) NOT DETECTED Final    Comment: Methicillin (oxacillin) susceptible Staphylococcus aureus (MSSA). Preferred therapy is anti staphylococcal beta lactam antibiotic (Cefazolin or Nafcillin), unless clinically contraindicated. CRITICAL RESULT CALLED TO, READ BACK BY AND VERIFIED WITH: TO JBATCHELDER(PHARMd) BY [facility] 07/21/2016 AT 4:53AM    Methicillin resistance NOT DETECTED NOT DETECTED Final   Streptococcus species NOT DETECTED NOT DETECTED Final   Streptococcus agalactiae NOT DETECTED NOT DETECTED Final   Streptococcus pneumoniae NOT DETECTED NOT DETECTED Final   Streptococcus pyogenes NOT DETECTED NOT DETECTED  Final   Acinetobacter baumannii NOT DETECTED NOT DETECTED Final   Enterobacteriaceae species NOT DETECTED NOT DETECTED Final   Enterobacter cloacae complex NOT DETECTED NOT DETECTED Final   Escherichia coli NOT DETECTED NOT DETECTED Final   Klebsiella oxytoca NOT DETECTED NOT DETECTED Final   Klebsiella  pneumoniae NOT DETECTED NOT DETECTED Final   Proteus species NOT DETECTED NOT DETECTED Final   Serratia marcescens NOT DETECTED NOT DETECTED Final   Haemophilus influenzae NOT DETECTED NOT DETECTED Final   Neisseria meningitidis NOT DETECTED NOT DETECTED Final   Pseudomonas aeruginosa NOT DETECTED NOT DETECTED Final   Candida albicans NOT DETECTED NOT DETECTED Final   Candida glabrata NOT DETECTED NOT DETECTED Final   Candida krusei NOT DETECTED NOT DETECTED Final   Candida parapsilosis NOT DETECTED NOT DETECTED Final   Candida tropicalis NOT DETECTED NOT DETECTED Final    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  On cefepime and nafcillin.  Repeat blood culture sent.   Will use cefepime alone for both at q8 dosing  2.  Meningitis - increased WBCs in the setting of TV endocarditis concerning for CNS emboli from vegetation.  CT ordered  3.  Medication monitoring - creat wnl, wbc wnl.  Will continue to monitor

## 2016-07-22 NOTE — Progress Notes (Signed)
Pharmacy Antibiotic Note  Johnathan MaduroChristopher [name] is a [age] YOM with hx Hep C, IVDA who presented on 7/13 with R-foot redness/swelling with fevers. The patient is noted to have MSSA + Psuedomonas bacteremia with TTE positive for tricuspid valve IE. ID on board. Pharmacy consulted for Cefepime dosing.   The patient's initial AKI has been noted to be resolved. SCr 1.01, CrCl~80-100 ml/min. Will adjust Cefepime to the higher dose as recommended with Pseudomonas.   Plan: 1. Adjust Cefepime to 2gm IV q8h 2. Continue Nafcillin 2g IV q4h 3. Will continue to follow renal function, culture results, LOT, and antibiotic de-escalation plans   Height: 5\' 11"  (180.3 cm) Weight: 156 lb (70.8 kg) IBW/kg (Calculated) : 75.3  Temp (24hrs), Avg:98.1 F (36.7 C), Min:97.8 F (36.6 C), Max:98.2 F (36.8 C)   Recent Labs Lab 07/18/16 2351 07/19/16 0010 07/19/16 0207 07/19/16 0548 07/20/16 0656 07/20/16 1032 07/21/16 0251 07/22/16 0144  WBC 12.6*  --   --  9.6 7.6  --  7.7 9.1  CREATININE 3.17*  --   --  2.02* 1.06  --  1.07 1.01  LATICACIDVEN  --  2.63* 2.38*  --  0.9 1.8  --   --     Estimated Creatinine Clearance: 92.5 mL/min (by C-G formula based on SCr of 1.01 mg/dL).    No Known Allergies  Antimicrobials this admission: Zosyn 7/13 >> 7/13 Vanc 7/13 >> 7/13 Cefepime 7/13 >>  Nafcillin 7/13 >>  Microbiology results: 7/12 BCx >> MSSA + PSA (pan sensitive).  7/13 CSF cx >>ngtd 7/13 RVP >> negative 7/13 MRSA PCR negative 7/13 UCx - negative 7/14 BCx >> 1/2 MSSA 7/15 BCx >>   Thank you for allowing pharmacy to be a part of this patient's care.  Georgina PillionElizabeth Athelene [name], PharmD, BCPS Clinical Pharmacist Pager: [phone] Clinical phone for 07/22/2016 from 7a-3:30p: [phone] If after 3:30p, please call main pharmacy at: [phone] 07/22/2016 11:21 AM

## 2016-07-22 NOTE — Progress Notes (Addendum)
   Cardiology was contacted in regards to arranging a TEE to evaluate for endocarditis in the setting of bacteremia. In talking with the patient, he is A&Ox3 but will quickly shut his eyes and upon waking, says he does not remember what was said. Was given IV Morphine and IV Ativan at [phone]. He cannot provide full consent. Is awaiting a Head CT for evaluation of his AMS.   Notified Dr. Catha GosselinMikhail. Will cancel TEE for today and reassess in the AM.   Signed, Ellsworth LennoxBrittany M Strader, PA-C 07/22/2016, 11:32 AM Pager: [phone]

## 2016-07-22 NOTE — Progress Notes (Signed)
Patient has been complaining of excruciating 10/10 generalized pain multiple times throughout the night. Patient paces in his room, lightly bangs his head on the wall, and using foul language. Schorr, NP notified. Got a one time addition dose of morphine 2mg  (see MAR). The pain seems to come-and-go, and has flare-ups that causes him to interfere with patient care. The patient has taken his monitor off multiple times, saying the tele leads, "hurts". Patient has been incredibly impulsive and not wanting to cooperate with staff. He eventually pulled his only IV out. This RN redirected him multiple times, and explained the importance of keeping his leads on and staying safely in bed. The patient says, "leave me alone", and that he, "can't take it anymore". When this RN tries to communicate and have the patient explain or describe his pain; he response with, "no" or "shit".

## 2016-07-23 ENCOUNTER — Inpatient Hospital Stay (HOSPITAL_COMMUNITY): Payer: Medicaid Other

## 2016-07-23 DIAGNOSIS — N179 Acute kidney failure, unspecified: Secondary | ICD-10-CM

## 2016-07-23 DIAGNOSIS — F10239 Alcohol dependence with withdrawal, unspecified: Secondary | ICD-10-CM

## 2016-07-23 DIAGNOSIS — G9340 Encephalopathy, unspecified: Secondary | ICD-10-CM

## 2016-07-23 DIAGNOSIS — F10939 Alcohol use, unspecified with withdrawal, unspecified: Secondary | ICD-10-CM

## 2016-07-23 DIAGNOSIS — F1593 Other stimulant use, unspecified with withdrawal: Secondary | ICD-10-CM

## 2016-07-23 DIAGNOSIS — R7881 Bacteremia: Secondary | ICD-10-CM

## 2016-07-23 DIAGNOSIS — M7989 Other specified soft tissue disorders: Secondary | ICD-10-CM

## 2016-07-23 LAB — CBC
HCT: 30 % — ABNORMAL LOW (ref 39.0–52.0)
HEMATOCRIT: 30.2 % — AB (ref 39.0–52.0)
Hemoglobin: 10.2 g/dL — ABNORMAL LOW (ref 13.0–17.0)
Hemoglobin: 10.1 g/dL — ABNORMAL LOW (ref 13.0–17.0)
MCH: 27.8 pg (ref 26.0–34.0)
MCH: 27.7 pg (ref 26.0–34.0)
MCHC: 33.8 g/dL (ref 30.0–36.0)
MCHC: 33.7 g/dL (ref 30.0–36.0)
MCV: 82.3 fL (ref 78.0–100.0)
MCV: 82.4 fL (ref 78.0–100.0)
PLATELETS: 134 10*3/uL — AB (ref 150–400)
Platelets: 144 10*3/uL — ABNORMAL LOW (ref 150–400)
RBC: 3.67 MIL/uL — ABNORMAL LOW (ref 4.22–5.81)
RBC: 3.64 MIL/uL — AB (ref 4.22–5.81)
RDW: 15.5 % (ref 11.5–15.5)
RDW: 15.5 % (ref 11.5–15.5)
WBC: 9.2 10*3/uL (ref 4.0–10.5)
WBC: 7.8 10*3/uL (ref 4.0–10.5)

## 2016-07-23 LAB — COMPREHENSIVE METABOLIC PANEL
ALBUMIN: 1.4 g/dL — AB (ref 3.5–5.0)
ALT: 21 U/L (ref 17–63)
AST: 29 U/L (ref 15–41)
Alkaline Phosphatase: 161 U/L — ABNORMAL HIGH (ref 38–126)
Anion gap: 6 (ref 5–15)
BUN: 12 mg/dL (ref 6–20)
CHLORIDE: 107 mmol/L (ref 101–111)
CO2: 21 mmol/L — ABNORMAL LOW (ref 22–32)
Calcium: 7.3 mg/dL — ABNORMAL LOW (ref 8.9–10.3)
Creatinine, Ser: 0.8 mg/dL (ref 0.61–1.24)
GFR calc Af Amer: >60 mL/min (ref >60)
GFR calc non Af Amer: >60 mL/min (ref >60)
GLUCOSE: 77 mg/dL (ref 65–99)
POTASSIUM: 4 mmol/L (ref 3.5–5.1)
Sodium: 134 mmol/L — ABNORMAL LOW (ref 135–145)
Total Bilirubin: 2 mg/dL — ABNORMAL HIGH (ref 0.3–1.2)
Total Protein: 5.8 g/dL — ABNORMAL LOW (ref 6.5–8.1)

## 2016-07-23 LAB — GLUCOSE, CAPILLARY
GLUCOSE-CAPILLARY: 74 mg/dL (ref 65–99)
Glucose-Capillary: 78 mg/dL (ref 65–99)
Glucose-Capillary: 84 mg/dL (ref 65–99)

## 2016-07-23 LAB — CULTURE, BLOOD (ROUTINE X 2): Special Requests: ADEQUATE

## 2016-07-23 LAB — CREATININE, SERUM
CREATININE: 0.85 mg/dL (ref 0.61–1.24)
GFR calc non Af Amer: >60 mL/min (ref >60)

## 2016-07-23 LAB — LACTIC ACID, PLASMA: Lactic Acid, Venous: 0.9 mmol/L (ref 0.5–1.9)

## 2016-07-23 LAB — STREP PNEUMONIAE URINARY ANTIGEN: STREP PNEUMO URINARY ANTIGEN: NEGATIVE

## 2016-07-23 LAB — PROCALCITONIN: Procalcitonin: 0.79 ng/mL

## 2016-07-23 LAB — PHOSPHORUS: Phosphorus: 2.8 mg/dL (ref 2.5–4.6)

## 2016-07-23 LAB — MAGNESIUM: MAGNESIUM: 1.9 mg/dL (ref 1.7–2.4)

## 2016-07-23 LAB — MRSA PCR SCREENING: MRSA BY PCR: NEGATIVE

## 2016-07-23 MED ORDER — METOPROLOL TARTRATE 5 MG/5ML IV SOLN
5.0000 mg | Freq: Once | INTRAVENOUS | Status: AC
  Administered 2016-07-23: 5 mg via INTRAVENOUS
  Filled 2016-07-23: qty 5

## 2016-07-23 MED ORDER — ALBUTEROL SULFATE (2.5 MG/3ML) 0.083% IN NEBU
2.5000 mg | INHALATION_SOLUTION | RESPIRATORY_TRACT | Status: DC | PRN
  Administered 2016-07-30: 2.5 mg via RESPIRATORY_TRACT
  Filled 2016-07-23: qty 3

## 2016-07-23 MED ORDER — SODIUM CHLORIDE 0.9 % IV SOLN
0.2000 ug/kg/h | INTRAVENOUS | Status: DC
  Administered 2016-07-23 (×3): 0.7 ug/kg/h via INTRAVENOUS
  Administered 2016-07-24: 1.5 ug/kg/h via INTRAVENOUS
  Filled 2016-07-23 (×6): qty 2

## 2016-07-23 MED ORDER — VITAMIN B-1 100 MG PO TABS
100.0000 mg | ORAL_TABLET | Freq: Every day | ORAL | Status: DC
  Filled 2016-07-23 (×3): qty 1

## 2016-07-23 MED ORDER — FAMOTIDINE IN NACL 20-0.9 MG/50ML-% IV SOLN
20.0000 mg | Freq: Two times a day (BID) | INTRAVENOUS | Status: DC
  Administered 2016-07-23 – 2016-07-24 (×3): 20 mg via INTRAVENOUS
  Filled 2016-07-23 (×3): qty 50

## 2016-07-23 MED ORDER — ONDANSETRON HCL 4 MG/2ML IJ SOLN: 4.0000 mg | Freq: Four times a day (QID) | INTRAMUSCULAR | Status: DC | PRN

## 2016-07-23 MED ORDER — FOLIC ACID 1 MG PO TABS
1.0000 mg | ORAL_TABLET | Freq: Every day | ORAL | Status: DC
  Filled 2016-07-23 (×3): qty 1

## 2016-07-23 MED ORDER — SODIUM CHLORIDE 0.9 % IV SOLN
INTRAVENOUS | Status: DC
  Administered 2016-07-23: 10:00:00 via INTRAVENOUS

## 2016-07-23 MED ORDER — ADULT MULTIVITAMIN W/MINERALS CH
1.0000 | ORAL_TABLET | Freq: Every day | ORAL | Status: DC
  Administered 2016-07-28 – 2016-07-29 (×2): 1 via ORAL
  Filled 2016-07-23 (×7): qty 1

## 2016-07-23 MED ORDER — SODIUM CHLORIDE 0.9 % IV SOLN
0.4000 ug/kg/h | INTRAVENOUS | Status: DC
  Administered 2016-07-23: 0.8 ug/kg/h via INTRAVENOUS
  Filled 2016-07-23 (×2): qty 2

## 2016-07-23 NOTE — Care Management Note (Signed)
Case Management Note  Patient Details  Name: Johnathan MaduroChristopher [name] MRN: [id number] Date of Birth: [birth year]  Subjective/Objective:  From home with son, pta indep, presents with cellulits of foot and R leg, on iv abx, he [name], he states he has [employer] [place] and his co pays are $3.50.  NCM called financial counselor and left message to see if patient Medicaid is active.   7/16 1742 Letha Capeeborah Taylor RN BSN -  Found to have MSSA bacteremia, possible meningitis, and LE cellulitis. Plan for echocardiogram and possibly TEE. ID consulted. CT of head today to r/o septic emboli.                     Action/Plan: NCM will cont to follow for dc needs.   Expected Discharge Date:                  Expected Discharge Plan:  Home/Self Care  In-House Referral:  NA  Discharge planning Services  CM Consult  Post Acute Care Choice:  NA Choice offered to:  NA  DME Arranged:  N/A DME Agency:  NA  HH Arranged:  NA HH Agency:  NA  Status of Service:  In process, will continue to follow  If discussed at Long Length of Stay Meetings, dates discussed:    Additional Comments: 07/23/2016 Pt transferred to ICU for Precedex drip due to ETOH.   Pt is a current IVDA.  If pt requires long term IV antibiotics - pt will require SNF due to IVDA.  CSW tentatively consulted Cherylann ParrClaxton, Georgette Helmer S, RN 07/23/2016, 2:43 PM

## 2016-07-23 NOTE — Progress Notes (Signed)
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, CNS infection  Interval History: repeat blood culture ngtd; afebrile; now in withdrawal and being transferred to ICU for pecedex CT noted with progressive atrophy Cefepime day 5 Nafcillin stopped  Physical Exam: Constitutional:  Vitals:   07/23/16 0600 07/23/16 0751  BP: (!) 151/104 (!) 145/94  Pulse:    Resp: (!) 45 (!) 21  Temp:  99.9 F (37.7 C)   patient appears agitated Eyes: anicteric HENT: no thrush, mm dry Respiratory: Normal respiratory effort; CTA B Cardiovascular: RRR GI: soft, nt, nd  Review of Systems: Unable to perform due to patient factors  Lab Results  Component Value Date   WBC 9.2 07/23/2016   HGB 10.2 (L) 07/23/2016   HCT 30.2 (L) 07/23/2016   MCV 82.3 07/23/2016   PLT 144 (L) 07/23/2016    Lab Results  Component Value Date   CREATININE 0.80 07/23/2016   BUN 12 07/23/2016   NA 134 (L) 07/23/2016   K 4.0 07/23/2016   CL 107 07/23/2016   CO2 21 (L) 07/23/2016    Lab Results  Component Value Date   ALT 21 07/23/2016   AST 29 07/23/2016   ALKPHOS 161 (H) 07/23/2016     Microbiology: Recent Results (from the past 240 hour(s))  Culture, blood (Routine x 2)     Status: Abnormal   Collection Time: 07/18/16 10:55 PM  Result Value Ref Range Status   Specimen Description BLOOD RIGHT ARM  Final   Special Requests IN PEDIATRIC BOTTLE Blood Culture adequate volume  Final   Culture  Setup Time   Final    GRAM POSITIVE COCCI IN CLUSTERS IN PEDIATRIC BOTTLE CRITICAL RESULT CALLED TO, READ BACK BY AND VERIFIED WITH: K HAMMONS,PHARMD AT 1619 07/19/16 BY L BENFIELD    Culture (A)  Final    STAPHYLOCOCCUS AUREUS SUSCEPTIBILITIES PERFORMED ON PREVIOUS CULTURE WITHIN THE LAST 5 DAYS.    Report Status 07/21/2016 FINAL  Final  Blood Culture ID Panel (Reflexed)     Status: Abnormal   Collection Time: 07/18/16 10:55 PM  Result  Value Ref Range Status   Enterococcus species NOT DETECTED NOT DETECTED Final   Listeria monocytogenes NOT DETECTED NOT DETECTED Final   Staphylococcus species DETECTED (A) NOT DETECTED Final    Comment: CRITICAL RESULT CALLED TO, READ BACK BY AND VERIFIED WITH: K HAMMONS, PHARMD AT 1619 07/19/16 BY L BENFIELD    Staphylococcus aureus DETECTED (A) NOT DETECTED Final    Comment: Methicillin (oxacillin) susceptible Staphylococcus aureus (MSSA). Preferred therapy is anti staphylococcal beta lactam antibiotic (Cefazolin or Nafcillin), unless clinically contraindicated. CRITICAL RESULT CALLED TO, READ BACK BY AND VERIFIED WITH: K HAMMONS,PHARMD AT 1619 07/19/16 BY L BENFIELD    Methicillin resistance NOT DETECTED NOT DETECTED Final   Streptococcus species NOT DETECTED NOT DETECTED Final   Streptococcus agalactiae NOT DETECTED NOT DETECTED Final   Streptococcus pneumoniae NOT DETECTED NOT DETECTED Final   Streptococcus pyogenes NOT DETECTED NOT DETECTED Final   Acinetobacter baumannii NOT DETECTED NOT DETECTED Final   Enterobacteriaceae species NOT DETECTED NOT DETECTED Final   Enterobacter cloacae complex NOT DETECTED NOT DETECTED Final   Escherichia coli NOT DETECTED NOT DETECTED Final   Klebsiella oxytoca NOT DETECTED NOT DETECTED Final   Klebsiella pneumoniae NOT DETECTED NOT DETECTED Final   Proteus species NOT DETECTED NOT DETECTED Final   Serratia marcescens NOT DETECTED NOT DETECTED Final  Carbapenem resistance NOT DETECTED NOT DETECTED Final   Haemophilus influenzae NOT DETECTED NOT DETECTED Final   Neisseria meningitidis NOT DETECTED NOT DETECTED Final   Pseudomonas aeruginosa DETECTED (A) NOT DETECTED Final    Comment: CRITICAL RESULT CALLED TO, READ BACK BY AND VERIFIED WITH: K HAMMONS,PHARMD AT 1619 07/19/16 BY L BENFIELD    Candida albicans NOT DETECTED NOT DETECTED Final   Candida glabrata NOT DETECTED NOT DETECTED Final   Candida krusei NOT DETECTED NOT DETECTED Final    Candida parapsilosis NOT DETECTED NOT DETECTED Final   Candida tropicalis NOT DETECTED NOT DETECTED Final  Culture, blood (Routine x 2)     Status: Abnormal   Collection Time: 07/18/16 11:45 PM  Result Value Ref Range Status   Specimen Description BLOOD LEFT ARM  Final   Special Requests   Final    BOTTLES DRAWN AEROBIC AND ANAEROBIC Blood Culture adequate volume   Culture  Setup Time   Final    GRAM POSITIVE COCCI IN CLUSTERS IN BOTH AEROBIC AND ANAEROBIC BOTTLES CRITICAL VALUE NOTED.  VALUE IS CONSISTENT WITH PREVIOUSLY REPORTED AND CALLED VALUE.    Culture STAPHYLOCOCCUS AUREUS PSEUDOMONAS AERUGINOSA  (A)  Final   Report Status 07/21/2016 FINAL  Final   Organism ID, Bacteria STAPHYLOCOCCUS AUREUS  Final   Organism ID, Bacteria PSEUDOMONAS AERUGINOSA  Final      Susceptibility   Pseudomonas aeruginosa - MIC*    CEFTAZIDIME 2 SENSITIVE Sensitive     CIPROFLOXACIN <=0.25 SENSITIVE Sensitive     GENTAMICIN <=1 SENSITIVE Sensitive     IMIPENEM 1 SENSITIVE Sensitive     PIP/TAZO 8 SENSITIVE Sensitive     CEFEPIME 2 SENSITIVE Sensitive     * PSEUDOMONAS AERUGINOSA   Staphylococcus aureus - MIC*    CIPROFLOXACIN <=0.5 SENSITIVE Sensitive     ERYTHROMYCIN 1 INTERMEDIATE Intermediate     GENTAMICIN <=0.5 SENSITIVE Sensitive     OXACILLIN <=0.25 SENSITIVE Sensitive     TETRACYCLINE <=1 SENSITIVE Sensitive     VANCOMYCIN 1 SENSITIVE Sensitive     TRIMETH/SULFA <=10 SENSITIVE Sensitive     CLINDAMYCIN RESISTANT Resistant     RIFAMPIN <=0.5 SENSITIVE Sensitive     Inducible Clindamycin POSITIVE Resistant     * STAPHYLOCOCCUS AUREUS  CSF culture     Status: None   Collection Time: 07/19/16  4:07 AM  Result Value Ref Range Status   Specimen Description CSF  Final   Special Requests NONE  Final   Gram Stain   Final    CYTOSPIN SMEAR WBC PRESENT, PREDOMINANTLY PMN NO ORGANISMS SEEN    Culture NO GROWTH 3 DAYS  Final   Report Status 07/22/2016 FINAL  Final  MRSA PCR Screening      Status: None   Collection Time: 07/19/16  5:26 AM  Result Value Ref Range Status   MRSA by PCR NEGATIVE NEGATIVE Final    Comment:        The GeneXpert MRSA Assay (FDA approved for NASAL specimens only), is one component of a comprehensive MRSA colonization surveillance program. It is not intended to diagnose MRSA infection nor to guide or monitor treatment for MRSA infections.   Respiratory Panel by PCR     Status: None   Collection Time: 07/19/16 12:19 PM  Result Value Ref Range Status   Adenovirus NOT DETECTED NOT DETECTED Final   Coronavirus 229E NOT DETECTED NOT DETECTED Final   Coronavirus HKU1 NOT DETECTED NOT DETECTED Final   Coronavirus NL63 NOT DETECTED NOT  DETECTED Final   Coronavirus OC43 NOT DETECTED NOT DETECTED Final   Metapneumovirus NOT DETECTED NOT DETECTED Final   Rhinovirus / Enterovirus NOT DETECTED NOT DETECTED Final   Influenza A NOT DETECTED NOT DETECTED Final   Influenza B NOT DETECTED NOT DETECTED Final   Parainfluenza Virus 1 NOT DETECTED NOT DETECTED Final   Parainfluenza Virus 2 NOT DETECTED NOT DETECTED Final   Parainfluenza Virus 3 NOT DETECTED NOT DETECTED Final   Parainfluenza Virus 4 NOT DETECTED NOT DETECTED Final   Respiratory Syncytial Virus NOT DETECTED NOT DETECTED Final   Bordetella pertussis NOT DETECTED NOT DETECTED Final   Chlamydophila pneumoniae NOT DETECTED NOT DETECTED Final   Mycoplasma pneumoniae NOT DETECTED NOT DETECTED Final  Culture, Urine     Status: Abnormal   Collection Time: 07/19/16 10:38 PM  Result Value Ref Range Status   Specimen Description URINE, CLEAN CATCH  Final   Special Requests NONE  Final   Culture MULTIPLE SPECIES PRESENT, SUGGEST RECOLLECTION (A)  Final   Report Status 07/21/2016 FINAL  Final  Culture, blood (routine x 2)     Status: None (Preliminary result)   Collection Time: 07/20/16  7:02 AM  Result Value Ref Range Status   Specimen Description BLOOD LEFT HAND  Final   Special Requests   Final     BOTTLES DRAWN AEROBIC ONLY Blood Culture adequate volume   Culture NO GROWTH 2 DAYS  Final   Report Status PENDING  Incomplete  Culture, blood (routine x 2)     Status: Abnormal   Collection Time: 07/20/16  7:03 AM  Result Value Ref Range Status   Specimen Description BLOOD RIGHT HAND  Final   Special Requests IN PEDIATRIC BOTTLE Blood Culture adequate volume  Final   Culture  Setup Time   Final    GRAM POSITIVE COCCI IN CLUSTERS IN PEDIATRIC BOTTLE CRITICAL RESULT CALLED TO, READ BACK BY AND VERIFIED WITH: TO JBATCHELDER(PHARMd) BY TCLEVELAND 07/21/2016 AT 4:57AM    Culture (A)  Final    STAPHYLOCOCCUS AUREUS SUSCEPTIBILITIES PERFORMED ON PREVIOUS CULTURE WITHIN THE LAST 5 DAYS.    Report Status 07/23/2016 FINAL  Final  Blood Culture ID Panel (Reflexed)     Status: Abnormal   Collection Time: 07/20/16  7:03 AM  Result Value Ref Range Status   Enterococcus species NOT DETECTED NOT DETECTED Final   Listeria monocytogenes NOT DETECTED NOT DETECTED Final   Staphylococcus species DETECTED (A) NOT DETECTED Final    Comment: CRITICAL RESULT CALLED TO, READ BACK BY AND VERIFIED WITH: TO JBATCHELDER(PHARMd) BY TCLEVELAND 07/21/2016 AT 4:53AM    Staphylococcus aureus DETECTED (A) NOT DETECTED Final    Comment: Methicillin (oxacillin) susceptible Staphylococcus aureus (MSSA). Preferred therapy is anti staphylococcal beta lactam antibiotic (Cefazolin or Nafcillin), unless clinically contraindicated. CRITICAL RESULT CALLED TO, READ BACK BY AND VERIFIED WITH: TO JBATCHELDER(PHARMd) BY [facility] 07/21/2016 AT 4:53AM    Methicillin resistance NOT DETECTED NOT DETECTED Final   Streptococcus species NOT DETECTED NOT DETECTED Final   Streptococcus agalactiae NOT DETECTED NOT DETECTED Final   Streptococcus pneumoniae NOT DETECTED NOT DETECTED Final   Streptococcus pyogenes NOT DETECTED NOT DETECTED Final   Acinetobacter baumannii NOT DETECTED NOT DETECTED Final   Enterobacteriaceae species NOT  DETECTED NOT DETECTED Final   Enterobacter cloacae complex NOT DETECTED NOT DETECTED Final   Escherichia coli NOT DETECTED NOT DETECTED Final   Klebsiella oxytoca NOT DETECTED NOT DETECTED Final   Klebsiella pneumoniae NOT DETECTED NOT DETECTED Final   Proteus species  NOT DETECTED NOT DETECTED Final   Serratia marcescens NOT DETECTED NOT DETECTED Final   Haemophilus influenzae NOT DETECTED NOT DETECTED Final   Neisseria meningitidis NOT DETECTED NOT DETECTED Final   Pseudomonas aeruginosa NOT DETECTED NOT DETECTED Final   Candida albicans NOT DETECTED NOT DETECTED Final   Candida glabrata NOT DETECTED NOT DETECTED Final   Candida krusei NOT DETECTED NOT DETECTED Final   Candida parapsilosis NOT DETECTED NOT DETECTED Final   Candida tropicalis NOT DETECTED NOT DETECTED Final  Culture, blood (single)     Status: None (Preliminary result)   Collection Time: 07/21/16  1:44 PM  Result Value Ref Range Status   Specimen Description BLOOD BLOOD RIGHT HAND  Final   Special Requests   Final    BOTTLES DRAWN AEROBIC AND ANAEROBIC Blood Culture adequate volume   Culture NO GROWTH < 24 HOURS  Final   Report Status PENDING  Incomplete    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  On cefepime alone for both.  Repeat blood culture ngtd.   Continue cefepime  2.  Meningitis - increased WBCs in the setting of TV endocarditis concerning for CNS emboli from vegetation.  CT with no emboli noted.   3.  Medication monitoring - creat wnl, wbc wnl.  Will continue to monitor

## 2016-07-23 NOTE — H&P (Signed)
[facility]   Name: Johnathan [name] MRN: [id number] DOB: 09-03-1970    ADMISSION DATE:  07/19/2016 CONSULTATION DATE:  07/23/2016  REFERRING MD:  Ree Kida  CHIEF COMPLAINT:  Need for Precedex to manage withdrawal from ETOH/ IVD,  HISTORY OF PRESENT ILLNESS:   Johnathan [name] a 46 y.o.male,w Hep C?, IVDA, (recently injected opana) apparently c/o fever intermittently for the past 1 week, and redness over the dorsum of the right foot and slight swelling. Pt notes injecting Opana about 2 days ago. Pt denies any recent dental work. Pt presented to ED due to subjective fevers.  In ED, Pt found to have mild cellulitis of the right >left foot as well as uti wbc 6-30. Pt noted to be in ARF bun/creat 26/3.17, And to have abnormal LFT, Ast 47, Alt 25, Alk phos 206, T. Bili 1.6, Alb 2.4. And hyponatremia Na 123 , CXR right lung base slightly more patchy may reflect aspiration or pneumonia. Possible small right pleural effusion Pt will be admitted for sepsis secondary to uti vs cellulitis and ARF and hyponatremia. (note LP pending due to ? Neck stiffness).Found to have MSSA bacteremia, possible meningitis, and LE cellulitis.  Echocardiogram Showed EF 36-46%, grade 1 diastolic dysfunction, tricuspid valve with mobile vegetation.He has developed acute encephalopathy with agitation, becoming belligerent and combative. He denies ETOH abuse, but admitted to IVDA. He was placed on CIWA protocol, however, scores remained 19-23, patient was not responsive  to ativan. Pt has become more combative with staff. CCM have been consulted for precedex gtt consideration, and care if transferred to ICU.  PAST MEDICAL HISTORY :  He  has a past medical history of Substance abuse.  PAST SURGICAL HISTORY: He  has a past surgical history that includes Incision and drainage of bilateral forearm abscesses (Bilateral) and Knee arthroscopy.  No Known Allergies  No current  facility-administered medications on file prior to encounter.    Current Outpatient Prescriptions on File Prior to Encounter  Medication Sig  . amoxicillin-clavulanate (AUGMENTIN) 875-125 MG per tablet Take 1 tablet by mouth 2 (two) times daily. (Patient not taking: Reported on 07/19/2016)  . cyclobenzaprine (FLEXERIL) 10 MG tablet Take 1 tablet (10 mg total) by mouth 2 (two) times daily as needed for muscle spasms. (Patient not taking: Reported on 09/12/2014)  . doxycycline (VIBRAMYCIN) 100 MG capsule Take 1 capsule (100 mg total) by mouth 2 (two) times daily. (Patient not taking: Reported on 07/19/2016)  . ibuprofen (ADVIL,MOTRIN) 800 MG tablet Take 1 tablet (800 mg total) by mouth 3 (three) times daily. (Patient not taking: Reported on 09/12/2014)    FAMILY HISTORY:  His indicated that his mother is alive. He indicated that his father is alive.    SOCIAL HISTORY: He  reports that he has been smoking Cigarettes.  He has a 7.50 pack-year smoking history. He has never used smokeless tobacco. He reports that he uses drugs, including Other-see comments. He reports that he does not drink alcohol.  REVIEW OF SYSTEMS:   Unable as patient is combative and encephalopathic  SUBJECTIVE:  Adult male, supine in bed, wearing mittens , RR 44 on RA. Screaming and attempting to hit nursing staff. Verbally abusive and combative  VITAL SIGNS: BP (!) 145/94   Pulse (!) 125   Temp 99.9 F (37.7 C) (Axillary)   Resp (!) 21   Ht 5' 11" (1.803 m)   Wt 158 lb (71.7 kg)   SpO2 97%   BMI 22.04 kg/m   HEMODYNAMICS:  VENTILATOR SETTINGS:    INTAKE / OUTPUT: I/O last 3 completed shifts: In: 653 [I.V.:3; IV Piggyback:650] Out: -   PHYSICAL EXAMINATION: General:  Awakens with cal of name, agitated and combative, on RA with RR 44. Neuro:  Alert to self only, MAE x 4, Combative, selectively following commands HEENT:  Normocephalic, atraumatic Cardiovascular:  Tachy per monitor, S1, S2, No RMG Lungs:   Clear throughout, diminished per bases, using accessory muscles when agitated Abdomen:  Soft and flat, BS +, non-distended Musculoskeletal:  No obvious deformities notes, muscle wasting Skin:  Dry and intact, Right foot cellulitis, pulses good with brisk refill  LABS:  BMET  Recent Labs Lab 07/21/16 0251 07/22/16 0144 07/23/16 0751  NA 130* 132* 134*  K 2.9* 3.6 4.0  CL 100* 100* 107  CO2 25 26 21*  BUN _0 CREATININE 1.07 1.01 0.80  GLUCOSE 119* 106* 77    Electrolytes  Recent Labs Lab 07/21/16 0251 07/21/16 1435 07/22/16 0144 07/23/16 0751  CALCIUM 7.0*  --  7.1* 7.3*  MG  --  1.8 1.9  --     CBC  Recent Labs Lab 07/21/16 0251 07/22/16 0144 07/23/16 0751  WBC 7.7 9.1 9.2  HGB 10.9* 12.3* 10.2*  HCT 32.5* 35.7* 30.2*  PLT 116* 142* 144*    Coag's  Recent Labs Lab 07/18/16 2351  INR 1.41    Sepsis Markers  Recent Labs Lab 07/19/16 0207 07/20/16 0656 07/20/16 1032  LATICACIDVEN 2.38* 0.9 1.8    ABG No results for input(s): PHART, PCO2ART, PO2ART in the last 168 hours.  Liver Enzymes  Recent Labs Lab 07/21/16 0251 07/22/16 0144 07/23/16 0751  AST _1 ALT _2 ALKPHOS 219* 203* 161*  BILITOT 5.2* 5.6* 2.0*  ALBUMIN 1.5* 1.7* 1.4*    Cardiac Enzymes  Recent Labs Lab 07/19/16 0548 07/19/16 1108 07/19/16 1902  TROPONINI <0.03 0.06* <0.03    Glucose No results for input(s): GLUCAP in the last 168 hours.  Imaging Ct Head W & Wo Contrast  Result Date: 07/22/2016 CLINICAL DATA:  Evaluate for septic emboli. IV drug abuser, with intermittent fever for the past 1 week. BILATERAL foot cellulitis. Hyponatremia. EXAM: CT HEAD WITHOUT AND WITH CONTRAST TECHNIQUE: Contiguous axial images were obtained from the base of the skull through the vertex without and with intravenous contrast CONTRAST:  86m ISOVUE-300 IOPAMIDOL (ISOVUE-300) INJECTION 61% COMPARISON:  CT head 08/23/2009. FINDINGS: Brain: No evidence of acute  infarction, hemorrhage, hydrocephalus, extra-axial collection or mass lesion/mass effect. Premature for age cerebral and cerebellar atrophy. Post infusion, no abnormal enhancement of the brain or visible meninges. No vasogenic edema. Vascular: No hyperdense vessel or unexpected calcification. Visible vessels, including major dural venous sinuses, are patent. Skull: Normal. Negative for fracture or focal lesion. Sinuses/Orbits: No acute finding. Other: None. Compared with priors, there has been progressive loss of brain substance. IMPRESSION: Progressive atrophy since 2011.  No acute intracranial findings. No abnormal enhancement or vasogenic edema to suggest septic emboli to the brain. If no contraindications, MRI without and with contrast is more sensitive in the detection of intracranial infection. Electronically Signed   By: JStaci RighterM.D.   On: 07/22/2016 15:55     STUDIES:  LP 7/13 >> High WBC and protein>> suspect meningitis but Cx no growth EchocardiogramShowed EF 567-59% grade 1 diastolic dysfunction, tricuspid valve with mobile vegetation>> Needs TEE but refusing CT Head 7/16>> Progressive atrophy, negative for edema to suggest septic emboli Renal ultrasound  7/13>> showed mild right pelvicaliectasis with ureteral jet noted and urinary bladder excluding significant ureteral obstruction  CULTURES: CSF 7/13 >> No growth Blood cultures >> 07/18/16 Staphylococcus aureus (2/2), Pseudomonas aeruginosa (however gram stain showed no GN) RVP 7/13>> Unremarkable Repeat Blood cultures >>  07/20/16: 1/2 Staph aureus  ANTIBIOTICS: Vanc 7/13>> 7/13 Zosyn 7/13>> 7/13 Nafcillin 7/13>> 7/16 Cefepime 7/16>>  SIGNIFICANT EVENTS: 7/17>> Transfer to ICU for precedex infusion   LINES/TUBES: PIV  DISCUSSION: Pt. With MSSA bacteremia,withdrawing from ETOH/IV drugs. He has become combative and will be transferred to ICU for Precedex infusion for 72 Hrs or less. CCM will manage care while in  ICU.  ASSESSMENT / PLAN:  PULMONARY A: Right lower lobe pneumonia versus aspiration pneumonia with cough  P:   Titrate FIO2 / Oxygen for saturations > 94% CXR now and 7/18/am Albuterol prn NPO Continue antibiotics as above Aggressive pulmonary toilet when able Mobilize when able Mucinex/ Flutter valve/ IS as able Minimize sedation to avoid intubation  CARDIOVASCULAR A:  Sepsis secondary to MSSA bacteremia-endocarditis Tachycardia EF>> 94-50% grade 1 diastolic dysfunction, tricuspid valve with mobile vegetation Needs TEE P: Telemetry monitoring  Maintain MAP > 65 TEE as able  Appreciate cards input and assistance  RENAL A:   Acute renal failure Creatinine upon admission 3.17, improving Hypokalemia Hyponatremia  P:   Trend BMET Mag Phos Replete Electrolytes as needed Avoid Nephrotoxic drugs Maintain renal perfusion Strict I&O Continue IV fluids and monitor BMP   GASTROINTESTINAL A:   Protein calorie malnutrition P:   [id number] Monitor LFT's on precedex NPO while on precedex Dietary consult when taking po's for supplementation   HEMATOLOGIC A:   Anemia P:  Trend CBC Transfuse for HGB <7 DVT prophylaxis:   INFECTIOUS A:   ? UTI>>Rare bacteria, 6-30 WBC, negative leukocytes and negative nitrites MSSA bacteremia endocarditis/possible meningitis/pneumonia/ R foot cellulitis  P:   Trend fever/ WBC Curve Trend PCT/Lactate Cultures as above ABX as above Follow micro for results Narrow ABX therapy once sensitivities available Appreciate ID input  ENDOCRINE A:   Abnormal TSH>>TSH 0.208, FT4 2.51 P:   CBG Q 4 SSI Repeat thyroid testing 3-4 weeks  NEUROLOGIC A:   Acute encephalopathy with agitation Concern for meningitis Withdrawal/ polysubstance abuse P:   Precedex for agitation/ combative abusive behavior RASS goal: 0 to 1 while on precedex Neuro Checks per unit  Routine Will d/c morphine and hydrocodone while on  presedex    FAMILY  - Updates: No family at bedside  - Inter-disciplinary family meet or Palliative Care meeting due by:  7/25 .   Magdalen Spatz, AGACNP-BC [facility] Pulmonary/Critical Care Medicine Pager # [phone] Pager: [phone]  Attending Note:  46 year old male with history of etoh abuse and heroine abuse who continues to use drugs and alcohol.  Presenting with endocarditis on TV.  ID following.  TEE needs to be done but patient has been refusing.  CVTS has not seen.  Will need to readdress once withdrawal is complete.  On exam with diffuse crackles and diminished on the bases (effusion).  RR of 45 times per minutes due to withdrawal.  I reviewed CXR myself, concern for loculated effusion vs empyema on the right.  Will check chest CT.  Continue abx per ID service.  Transfer in the ICU for precedex and CIWA with thiamine/folate/MVI.  The patient is critically ill with multiple organ systems failure and requires high complexity decision making for assessment and support, frequent evaluation and titration of therapies,  application of advanced monitoring technologies and extensive interpretation of multiple databases.   Critical Care Time devoted to patient care services described in this note is  35  Minutes. This time reflects time of care of this signee Dr [name]. This critical care time does not reflect procedure time, or teaching time or supervisory time of PA/NP/Med student/Med Resident etc but could involve care discussion time.  Rush Farmer, M.D. [facility] Pulmonary/Critical Care Medicine. Pager: [phone]. After hours pager: [phone].  07/23/2016, 8:55 AM

## 2016-07-23 NOTE — Progress Notes (Signed)
**  Preliminary report by tech**  Bilateral lower extremity venous duplex completed. There is no evidence of deep or superficial vein thrombosis involving the right and left lower extremities. All visualized vessels appear patent and compressible. There is no evidence of Baker's cysts bilaterally.  07/23/16 1:29 PM Olen CordialGreg Tekoa Amon RVT

## 2016-07-23 NOTE — Progress Notes (Signed)
   [facility] has been requested to perform a transesophageal echocardiogram on Johnathan [name] for bacteremia.  After careful review of history and examination, the risks and benefits of transesophageal echocardiogram have been explained including risks of esophageal damage, perforation (1:10,000 risk), bleeding, pharyngeal hematoma as well as other potential complications associated with conscious sedation including aspiration, arrhythmia, respiratory failure and death. Alternatives to treatment were discussed, questions were answered.  The patient is disoriented and not able to give consent. I tried calling his mother but was unable to reach her at [phone]. Left message to call back so we can obtain verbal consent for the procedure.   Ellsworth LennoxBrittany M Jamaine Quintin, PA-C  07/23/2016 3:12 PM

## 2016-07-23 NOTE — Clinical Social Work Note (Signed)
CSW acknowledges substance abuse consult. Patient currently disoriented x 4. CSW will continue to follow and assess when appropriate.  Charlynn CourtSarah Santina Trillo, CSW [phone]

## 2016-07-23 NOTE — Progress Notes (Addendum)
PROGRESS NOTE    Johnathan [name] Comes  OZD:[id number] DOB: 10-02-1970 DOA: 07/19/2016 PCP: [name], MD   Chief Complaint  Patient presents with  . Cellulitis    Brief Narrative:  HPI On 07/19/2016 by Dr. [name] [name]  is a 46 y.o. male, w Hep C?, IVDA, (recently injected opana) apparently c/o fever intermittently for the past 1 week, and redness over the dorsum of the right foot and slight swelling.  Pt notes injecting Opana about 2 days ago.  Pt denies any recent dental work. Pt presented to ED due to subjective fevers.  In ED,  Pt found to have mild cellulitis of the right > left foot as well as uti wbc 6-30.  Pt noted to be in ARF bun/creat 26/3.17,  And to have abnormal LFT, Ast 47, Alt 25, Alk phos 206, T. Bili 1.6, Alb 2.4.  And hyponatremia  Na 123  , CXR right lung base slightly more patchy may reflect aspiration or pneumonia.  Possible small right pleural effusion   Pt will be admitted for sepsis secondary to uti vs cellulitis and ARF and hyponatremia.  (note LP pending due to ? Neck stiffness)  Interim history Found to have MSSA bacteremia, possible meningitis, and LE cellulitis. Plan for echocardiogram and possibly TEE. ID consulted.  Assessment & Plan   Sepsis secondary to MSSA bacteremia-endocarditis/possible meningitis/pneumonia/cellulitis  -Patient presented with fever, tachycardia, tachypnea -Status post lumbar puncture: High WBC and protein, therefore meningitis suspected. -CSF culture shows no growth to one date, no organisms seen on Gram stain -Blood cultures 07/18/16 Staphylococcus aureus (2/2), Pseudomonas aeruginosa (however gram stain showed no GN) -Respiratory viral panel unremarkable  -Initially placed on vancomycin and Zosyn however given MSSA and Pseudomonas, patient placed on cefepime -Infectious disease consulted and appreciated -CT head: progressive atrophy since 2011, otherwise unremarkable -EchocardiogramShowed EF 55-60%, grade 1  diastolic dysfunction, tricuspid valve with mobile vegetation -Cardiology consulted for TEE, however, patient being difficult and cannot obtain consent, will reassess today 07/23/16 - however not sure they will be able to obtain consent given his current behavior and agitation  -Repeat Blood cultures from 07/20/16: 1/2 Staph aureus -Naficillin discontinued on 07/22/16 by ID  Acute encephalopathy with agitation -Patient denied alcohol use, admitted to drug use  -Over the past few days has become more belligerent and combative.  -Placed on CIWA, however, scores remain 19-23, patient not really responsive to ativan -PCCM consulted and appreciated for possible precedex drip.   ?Urinary tract infection -UA: Rare bacteria, 6-30 WBC, negative leukocytes and negative nitrites -patient thought to have a UTI on admission, however, no symptoms of UTI and UA unimpressive for infection -Urine culture showed multiple species  Cellulitis of the right lower extremity, foot -As above, was on vancomycin however has been transitioned to nafcillin and cefepime -Improved, erythema/edema decreasing  Right lower lobe pneumonia versus aspiration pneumonia with cough -Chest x-ray showed emphysema, streaky lower lobe opacities favoring atelectasis. Right lung base slightly more patchy, may reflect aspiration or pneumonia -Speech consulted for possible aspiration, recommended regular thin liquid diet -Continue antibiotics as above -Continue Tessalon Perles, Mucinex, flutter valve, incentive spirometry, tussionex QHS PRN  Acute renal failure -Renal ultrasound showed mild right pelvicaliectasis with ureteral jet noted and urinary bladder excluding significant ureteral obstruction -Creatinine upon admission 3.17, improved to 1.01 -Continue IV fluids and monitor BMP   Hypokalemia -Improved with supplementation, continue to monitor BMP  Hyponatremia -Sodium improving, continue IV fluids and monitor  BMP  Polysubstance abuse -Recently  injected Opana days prior to admission -Suboxone was discontinued as patient was having pain -Continue morphine and hydrocodone as needed -Overnight, patient became belligerent, was placed CIWA protocol   Neck pain -Status post lumbar puncture, CSF culture shows no growth to date -Treatment plan as above  Abnormal TSH -TSH 0.208, FT4 2.51 -given clinical illness, would not start treatment at this time -would repeat thyroid testing in 3-4 weeks  Moderate Malnutrition/hypoalbuminemia  -Nutrition consulted, continue nutritional supplements  DVT Prophylaxis  Heparin  Code Status: Full  Family Communication: None at bedside   Disposition Plan: Admitted. Pending further workup for bacteremia (TEE). Currently appears to be going through withdrawals. PCCM consulted for possible precedex drip  Consultants Infectious disease Cardiology  PCCM  Procedures  Renal US Echocardiogram  Antibiotics   Anti-infectives    Start     Dose/Rate Route Frequency Ordered Stop   07/22/16 1400  ceFEPIme (MAXIPIME) 2 g in dextrose 5 % 50 mL IVPB     2 g 100 mL/hr over 30 Minutes Intravenous Every 8 hours 07/22/16 1122     07/20/16 1500  ceFEPIme (MAXIPIME) 2 g in dextrose 5 % 50 mL IVPB  Status:  Discontinued     2 g 100 mL/hr over 30 Minutes Intravenous Every 12 hours 07/20/16 1410 07/22/16 1122   07/19/16 2300  vancomycin (VANCOCIN) IVPB 750 mg/150 ml premix  Status:  Discontinued     750 mg 150 mL/hr over 60 Minutes Intravenous Every 24 hours 07/19/16 0237 07/19/16 0953   07/19/16 2200  ceFEPIme (MAXIPIME) 2 g in dextrose 5 % 50 mL IVPB  Status:  Discontinued     2 g 100 mL/hr over 30 Minutes Intravenous Every 24 hours 07/19/16 1931 07/20/16 1410   07/19/16 2000  nafcillin 2 g in dextrose 5 % 100 mL IVPB  Status:  Discontinued     2 g 200 mL/hr over 30 Minutes Intravenous Every 4 hours 07/19/16 1915 07/22/16 1130   07/19/16 1915  nafcillin injection 2 g   Status:  Discontinued     2 g Intravenous Every 4 hours 07/19/16 1912 07/19/16 1915   07/19/16 1800  ceFEPIme (MAXIPIME) 2 g in dextrose 5 % 50 mL IVPB  Status:  Discontinued     2 g 100 mL/hr over 30 Minutes Intravenous Every 24 hours 07/19/16 1646 07/19/16 1912   07/19/16 1100  vancomycin (VANCOCIN) 500 mg in sodium chloride 0.9 % 100 mL IVPB  Status:  Discontinued     500 mg 100 mL/hr over 60 Minutes Intravenous Every 12 hours 07/19/16 0953 07/19/16 1645   07/19/16 0800  piperacillin-tazobactam (ZOSYN) IVPB 3.375 g  Status:  Discontinued     3.375 g 12.5 mL/hr over 240 Minutes Intravenous Every 8 hours 07/19/16 0239 07/19/16 1645   07/19/16 0200  cefTRIAXone (ROCEPHIN) 2 g in dextrose 5 % 50 mL IVPB     2 g 100 mL/hr over 30 Minutes Intravenous  Once 07/19/16 0140 07/19/16 0259   07/19/16 0130  cefTRIAXone (ROCEPHIN) 1 g in dextrose 5 % 50 mL IVPB  Status:  Discontinued     1 g 100 mL/hr over 30 Minutes Intravenous  Once 07/19/16 0125 07/19/16 0140   07/19/16 0115  piperacillin-tazobactam (ZOSYN) IVPB 3.375 g     3.375 g 100 mL/hr over 30 Minutes Intravenous  Once 07/19/16 0109 07/19/16 0219   07/19/16 0115  vancomycin (VANCOCIN) IVPB 1000 mg/200 mL premix     1,000 mg 200 mL/hr over 60 Minutes Intravenous  Once 07/19/16 0109 07/19/16 0243      Subjective:   Delon [place] seen and examined today.  Continues to be combative. Overnight, RN continue to give Ativan per CIWA protocol. Currently in mittens. Patient was yelling/screaming at the RN.   Today, patient not conversant, appears somewhat lethargic. Yelling out.   Objective:   Vitals:   07/23/16 0400 07/23/16 0500 07/23/16 0600 07/23/16 0751  BP: (!) 141/94 (!) 149/117 (!) 151/104 (!) 145/94  Pulse: (!) 125     Resp: (!) 40 (!) 32 (!) 45 (!) 21  Temp:    99.9 F (37.7 C)  TempSrc:    Axillary  SpO2: 99%   97%  Weight:      Height:        Intake/Output Summary (Last 24 hours) at 07/23/16 0815 Last data filed  at 07/23/16 6754  Gross per 24 hour  Intake              253 ml  Output                0 ml  Net              253 ml   Filed Weights   07/21/16 0451 07/22/16 0336 07/23/16 0359  Weight: 68 kg (150 lb) 70.8 kg (156 lb) 71.7 kg (158 lb)   Exam  General: Well developed, thin, lethargic  HEENT: NCAT, mucous membranes moist.   Cardiovascular: S1 S2 auscultated, tachycardic  Respiratory: Diminished breath sounds, shallow, occ cough  Abdomen: Soft, nontender, nondistended, + bowel sounds  Extremities: warm dry without cyanosis clubbing. RLE edema/erythem improving  Neuro: lethargic, cannot fully assess. Moving all extremities with ease (swinging arms, kicking)  Data Reviewed: I have personally reviewed following labs and imaging studies  CBC:  Recent Labs Lab 07/18/16 2351 07/19/16 0548 07/20/16 0656 07/21/16 0251 07/22/16 0144  WBC 12.6* 9.6 7.6 7.7 9.1  NEUTROABS 9.5*  --   --   --   --   HGB 12.1* 11.0* 11.2* 10.9* 12.3*  HCT 35.7* 32.3* 32.2* 32.5* 35.7*  MCV 84.0 82.8 82.4 82.5 82.1  PLT 112* 101* 99* 116* 492*   Basic Metabolic Panel:  Recent Labs Lab 07/18/16 2351 07/19/16 0548 07/20/16 0656 07/21/16 0251 07/21/16 1435 07/22/16 0144  NA 123* 129* 131* 130*  --  132*  K 4.1 3.2* 3.2* 2.9*  --  3.6  CL 89* 100* 101 100*  --  100*  CO2 26 21* 24 25  --  26  GLUCOSE 160* 98 109* 119*  --  106*  BUN 26* 24* 18 17  --  16  CREATININE 3.17* 2.02* 1.06 1.07  --  1.01  CALCIUM 8.1* 6.8* 7.3* 7.0*  --  7.1*  MG  --   --   --   --  1.8 1.9   GFR: Estimated Creatinine Clearance: 93.7 mL/min (by C-G formula based on SCr of 1.01 mg/dL). Liver Function Tests:  Recent Labs Lab 07/18/16 2351 07/19/16 0548 07/21/16 0251 07/22/16 0144  AST 47* 51* 28 26  ALT '25 27 29 28  ' ALKPHOS 206* 207* 219* 203*  BILITOT 1.6* 1.4* 5.2* 5.6*  PROT 6.6 5.6* 6.0* 6.8  ALBUMIN 2.4* 1.8* 1.5* 1.7*   No results for input(s): LIPASE, AMYLASE in the last 168 hours. No results  for input(s): AMMONIA in the last 168 hours. Coagulation Profile:  Recent Labs Lab 07/18/16 2351  INR 1.41   Cardiac Enzymes:  Recent Labs Lab 07/19/16  2947 07/19/16 1108 07/19/16 1902  TROPONINI <0.03 0.06* <0.03   BNP (last 3 results) No results for input(s): PROBNP in the last 8760 hours. HbA1C: No results for input(s): HGBA1C in the last 72 hours. CBG: No results for input(s): GLUCAP in the last 168 hours. Lipid Profile: No results for input(s): CHOL, HDL, LDLCALC, TRIG, CHOLHDL, LDLDIRECT in the last 72 hours. Thyroid Function Tests:  Recent Labs  07/20/16 1400  FREET4 2.51*   Anemia Panel: No results for input(s): VITAMINB12, FOLATE, FERRITIN, TIBC, IRON, RETICCTPCT in the last 72 hours. Urine analysis:    Component Value Date/Time   COLORURINE AMBER (A) 07/18/2016 2255   APPEARANCEUR CLOUDY (A) 07/18/2016 2255   LABSPEC 1.018 07/18/2016 2255   PHURINE 5.0 07/18/2016 2255   GLUCOSEU NEGATIVE 07/18/2016 2255   HGBUR MODERATE (A) 07/18/2016 2255   BILIRUBINUR SMALL (A) 07/18/2016 2255   KETONESUR NEGATIVE 07/18/2016 2255   PROTEINUR 100 (A) 07/18/2016 2255   NITRITE NEGATIVE 07/18/2016 2255   LEUKOCYTESUR NEGATIVE 07/18/2016 2255   Sepsis Labs: '@LABRCNTIP' (procalcitonin:4,lacticidven:4)  ) Recent Results (from the past 240 hour(s))  Culture, blood (Routine x 2)     Status: Abnormal   Collection Time: 07/18/16 10:55 PM  Result Value Ref Range Status   Specimen Description BLOOD RIGHT ARM  Final   Special Requests IN PEDIATRIC BOTTLE Blood Culture adequate volume  Final   Culture  Setup Time   Final    GRAM POSITIVE COCCI IN CLUSTERS IN PEDIATRIC BOTTLE CRITICAL RESULT CALLED TO, READ BACK BY AND VERIFIED WITH: K HAMMONS,PHARMD AT 1619 07/19/16 BY L BENFIELD    Culture (A)  Final    STAPHYLOCOCCUS AUREUS SUSCEPTIBILITIES PERFORMED ON PREVIOUS CULTURE WITHIN THE LAST 5 DAYS.    Report Status 07/21/2016 FINAL  Final  Blood Culture ID Panel (Reflexed)      Status: Abnormal   Collection Time: 07/18/16 10:55 PM  Result Value Ref Range Status   Enterococcus species NOT DETECTED NOT DETECTED Final   Listeria monocytogenes NOT DETECTED NOT DETECTED Final   Staphylococcus species DETECTED (A) NOT DETECTED Final    Comment: CRITICAL RESULT CALLED TO, READ BACK BY AND VERIFIED WITH: K HAMMONS, PHARMD AT 1619 07/19/16 BY L BENFIELD    Staphylococcus aureus DETECTED (A) NOT DETECTED Final    Comment: Methicillin (oxacillin) susceptible Staphylococcus aureus (MSSA). Preferred therapy is anti staphylococcal beta lactam antibiotic (Cefazolin or Nafcillin), unless clinically contraindicated. CRITICAL RESULT CALLED TO, READ BACK BY AND VERIFIED WITH: K HAMMONS,PHARMD AT 1619 07/19/16 BY L BENFIELD    Methicillin resistance NOT DETECTED NOT DETECTED Final   Streptococcus species NOT DETECTED NOT DETECTED Final   Streptococcus agalactiae NOT DETECTED NOT DETECTED Final   Streptococcus pneumoniae NOT DETECTED NOT DETECTED Final   Streptococcus pyogenes NOT DETECTED NOT DETECTED Final   Acinetobacter baumannii NOT DETECTED NOT DETECTED Final   Enterobacteriaceae species NOT DETECTED NOT DETECTED Final   Enterobacter cloacae complex NOT DETECTED NOT DETECTED Final   Escherichia coli NOT DETECTED NOT DETECTED Final   Klebsiella oxytoca NOT DETECTED NOT DETECTED Final   Klebsiella pneumoniae NOT DETECTED NOT DETECTED Final   Proteus species NOT DETECTED NOT DETECTED Final   Serratia marcescens NOT DETECTED NOT DETECTED Final   Carbapenem resistance NOT DETECTED NOT DETECTED Final   Haemophilus influenzae NOT DETECTED NOT DETECTED Final   Neisseria meningitidis NOT DETECTED NOT DETECTED Final   Pseudomonas aeruginosa DETECTED (A) NOT DETECTED Final    Comment: CRITICAL RESULT CALLED TO, READ BACK BY AND  VERIFIED WITH: K HAMMONS,PHARMD AT 1619 07/19/16 BY L BENFIELD    Candida albicans NOT DETECTED NOT DETECTED Final   Candida glabrata NOT DETECTED NOT  DETECTED Final   Candida krusei NOT DETECTED NOT DETECTED Final   Candida parapsilosis NOT DETECTED NOT DETECTED Final   Candida tropicalis NOT DETECTED NOT DETECTED Final  Culture, blood (Routine x 2)     Status: Abnormal   Collection Time: 07/18/16 11:45 PM  Result Value Ref Range Status   Specimen Description BLOOD LEFT ARM  Final   Special Requests   Final    BOTTLES DRAWN AEROBIC AND ANAEROBIC Blood Culture adequate volume   Culture  Setup Time   Final    GRAM POSITIVE COCCI IN CLUSTERS IN BOTH AEROBIC AND ANAEROBIC BOTTLES CRITICAL VALUE NOTED.  VALUE IS CONSISTENT WITH PREVIOUSLY REPORTED AND CALLED VALUE.    Culture STAPHYLOCOCCUS AUREUS PSEUDOMONAS AERUGINOSA  (A)  Final   Report Status 07/21/2016 FINAL  Final   Organism ID, Bacteria STAPHYLOCOCCUS AUREUS  Final   Organism ID, Bacteria PSEUDOMONAS AERUGINOSA  Final      Susceptibility   Pseudomonas aeruginosa - MIC*    CEFTAZIDIME 2 SENSITIVE Sensitive     CIPROFLOXACIN <=0.25 SENSITIVE Sensitive     GENTAMICIN <=1 SENSITIVE Sensitive     IMIPENEM 1 SENSITIVE Sensitive     PIP/TAZO 8 SENSITIVE Sensitive     CEFEPIME 2 SENSITIVE Sensitive     * PSEUDOMONAS AERUGINOSA   Staphylococcus aureus - MIC*    CIPROFLOXACIN <=0.5 SENSITIVE Sensitive     ERYTHROMYCIN 1 INTERMEDIATE Intermediate     GENTAMICIN <=0.5 SENSITIVE Sensitive     OXACILLIN <=0.25 SENSITIVE Sensitive     TETRACYCLINE <=1 SENSITIVE Sensitive     VANCOMYCIN 1 SENSITIVE Sensitive     TRIMETH/SULFA <=10 SENSITIVE Sensitive     CLINDAMYCIN RESISTANT Resistant     RIFAMPIN <=0.5 SENSITIVE Sensitive     Inducible Clindamycin POSITIVE Resistant     * STAPHYLOCOCCUS AUREUS  CSF culture     Status: None   Collection Time: 07/19/16  4:07 AM  Result Value Ref Range Status   Specimen Description CSF  Final   Special Requests NONE  Final   Gram Stain   Final    CYTOSPIN SMEAR WBC PRESENT, PREDOMINANTLY PMN NO ORGANISMS SEEN    Culture NO GROWTH 3 DAYS  Final    Report Status 07/22/2016 FINAL  Final  MRSA PCR Screening     Status: None   Collection Time: 07/19/16  5:26 AM  Result Value Ref Range Status   MRSA by PCR NEGATIVE NEGATIVE Final    Comment:        The GeneXpert MRSA Assay (FDA approved for NASAL specimens only), is one component of a comprehensive MRSA colonization surveillance program. It is not intended to diagnose MRSA infection nor to guide or monitor treatment for MRSA infections.   Respiratory Panel by PCR     Status: None   Collection Time: 07/19/16 12:19 PM  Result Value Ref Range Status   Adenovirus NOT DETECTED NOT DETECTED Final   Coronavirus 229E NOT DETECTED NOT DETECTED Final   Coronavirus HKU1 NOT DETECTED NOT DETECTED Final   Coronavirus NL63 NOT DETECTED NOT DETECTED Final   Coronavirus OC43 NOT DETECTED NOT DETECTED Final   Metapneumovirus NOT DETECTED NOT DETECTED Final   Rhinovirus / Enterovirus NOT DETECTED NOT DETECTED Final   Influenza A NOT DETECTED NOT DETECTED Final   Influenza B NOT DETECTED NOT DETECTED  Final   Parainfluenza Virus 1 NOT DETECTED NOT DETECTED Final   Parainfluenza Virus 2 NOT DETECTED NOT DETECTED Final   Parainfluenza Virus 3 NOT DETECTED NOT DETECTED Final   Parainfluenza Virus 4 NOT DETECTED NOT DETECTED Final   Respiratory Syncytial Virus NOT DETECTED NOT DETECTED Final   Bordetella pertussis NOT DETECTED NOT DETECTED Final   Chlamydophila pneumoniae NOT DETECTED NOT DETECTED Final   Mycoplasma pneumoniae NOT DETECTED NOT DETECTED Final  Culture, Urine     Status: Abnormal   Collection Time: 07/19/16 10:38 PM  Result Value Ref Range Status   Specimen Description URINE, CLEAN CATCH  Final   Special Requests NONE  Final   Culture MULTIPLE SPECIES PRESENT, SUGGEST RECOLLECTION (A)  Final   Report Status 07/21/2016 FINAL  Final  Culture, blood (routine x 2)     Status: None (Preliminary result)   Collection Time: 07/20/16  7:02 AM  Result Value Ref Range Status   Specimen  Description BLOOD LEFT HAND  Final   Special Requests   Final    BOTTLES DRAWN AEROBIC ONLY Blood Culture adequate volume   Culture NO GROWTH 2 DAYS  Final   Report Status PENDING  Incomplete  Culture, blood (routine x 2)     Status: Abnormal   Collection Time: 07/20/16  7:03 AM  Result Value Ref Range Status   Specimen Description BLOOD RIGHT HAND  Final   Special Requests IN PEDIATRIC BOTTLE Blood Culture adequate volume  Final   Culture  Setup Time   Final    GRAM POSITIVE COCCI IN CLUSTERS IN PEDIATRIC BOTTLE CRITICAL RESULT CALLED TO, READ BACK BY AND VERIFIED WITH: TO JBATCHELDER(PHARMd) BY TCLEVELAND 07/21/2016 AT 4:57AM    Culture (A)  Final    STAPHYLOCOCCUS AUREUS SUSCEPTIBILITIES PERFORMED ON PREVIOUS CULTURE WITHIN THE LAST 5 DAYS.    Report Status 07/23/2016 FINAL  Final  Blood Culture ID Panel (Reflexed)     Status: Abnormal   Collection Time: 07/20/16  7:03 AM  Result Value Ref Range Status   Enterococcus species NOT DETECTED NOT DETECTED Final   Listeria monocytogenes NOT DETECTED NOT DETECTED Final   Staphylococcus species DETECTED (A) NOT DETECTED Final    Comment: CRITICAL RESULT CALLED TO, READ BACK BY AND VERIFIED WITH: TO JBATCHELDER(PHARMd) BY TCLEVELAND 07/21/2016 AT 4:53AM    Staphylococcus aureus DETECTED (A) NOT DETECTED Final    Comment: Methicillin (oxacillin) susceptible Staphylococcus aureus (MSSA). Preferred therapy is anti staphylococcal beta lactam antibiotic (Cefazolin or Nafcillin), unless clinically contraindicated. CRITICAL RESULT CALLED TO, READ BACK BY AND VERIFIED WITH: TO JBATCHELDER(PHARMd) BY [facility] 07/21/2016 AT 4:53AM    Methicillin resistance NOT DETECTED NOT DETECTED Final   Streptococcus species NOT DETECTED NOT DETECTED Final   Streptococcus agalactiae NOT DETECTED NOT DETECTED Final   Streptococcus pneumoniae NOT DETECTED NOT DETECTED Final   Streptococcus pyogenes NOT DETECTED NOT DETECTED Final   Acinetobacter baumannii NOT  DETECTED NOT DETECTED Final   Enterobacteriaceae species NOT DETECTED NOT DETECTED Final   Enterobacter cloacae complex NOT DETECTED NOT DETECTED Final   Escherichia coli NOT DETECTED NOT DETECTED Final   Klebsiella oxytoca NOT DETECTED NOT DETECTED Final   Klebsiella pneumoniae NOT DETECTED NOT DETECTED Final   Proteus species NOT DETECTED NOT DETECTED Final   Serratia marcescens NOT DETECTED NOT DETECTED Final   Haemophilus influenzae NOT DETECTED NOT DETECTED Final   Neisseria meningitidis NOT DETECTED NOT DETECTED Final   Pseudomonas aeruginosa NOT DETECTED NOT DETECTED Final   Candida albicans NOT  DETECTED NOT DETECTED Final   Candida glabrata NOT DETECTED NOT DETECTED Final   Candida krusei NOT DETECTED NOT DETECTED Final   Candida parapsilosis NOT DETECTED NOT DETECTED Final   Candida tropicalis NOT DETECTED NOT DETECTED Final  Culture, blood (single)     Status: None (Preliminary result)   Collection Time: 07/21/16  1:44 PM  Result Value Ref Range Status   Specimen Description BLOOD BLOOD RIGHT HAND  Final   Special Requests   Final    BOTTLES DRAWN AEROBIC AND ANAEROBIC Blood Culture adequate volume   Culture NO GROWTH < 24 HOURS  Final   Report Status PENDING  Incomplete      Radiology Studies: Ct Head W & Wo Contrast  Result Date: 07/22/2016 CLINICAL DATA:  Evaluate for septic emboli. IV drug abuser, with intermittent fever for the past 1 week. BILATERAL foot cellulitis. Hyponatremia. EXAM: CT HEAD WITHOUT AND WITH CONTRAST TECHNIQUE: Contiguous axial images were obtained from the base of the skull through the vertex without and with intravenous contrast CONTRAST:  87m ISOVUE-300 IOPAMIDOL (ISOVUE-300) INJECTION 61% COMPARISON:  CT head 08/23/2009. FINDINGS: Brain: No evidence of acute infarction, hemorrhage, hydrocephalus, extra-axial collection or mass lesion/mass effect. Premature for age cerebral and cerebellar atrophy. Post infusion, no abnormal enhancement of the brain  or visible meninges. No vasogenic edema. Vascular: No hyperdense vessel or unexpected calcification. Visible vessels, including major dural venous sinuses, are patent. Skull: Normal. Negative for fracture or focal lesion. Sinuses/Orbits: No acute finding. Other: None. Compared with priors, there has been progressive loss of brain substance. IMPRESSION: Progressive atrophy since 2011.  No acute intracranial findings. No abnormal enhancement or vasogenic edema to suggest septic emboli to the brain. If no contraindications, MRI without and with contrast is more sensitive in the detection of intracranial infection. Electronically Signed   By: JStaci RighterM.D.   On: 07/22/2016 15:55     Scheduled Meds: . benzonatate  200 mg Oral TID  . feeding supplement (PRO-STAT SUGAR FREE 64)  30 mL Oral BID  . guaiFENesin  600 mg Oral BID  . heparin  5,000 Units Subcutaneous Q8H  . pneumococcal 23 valent vaccine  0.5 mL Intramuscular Tomorrow-1000  . sodium chloride flush  3 mL Intravenous Q12H   Continuous Infusions: . ceFEPime (MAXIPIME) IV Stopped (07/23/16 05852     LOS: 4 days   Time Spent in minutes   45 minutes  Lakysha Kossman D.O. on 07/23/2016 at 8:15 AM  Between 7am to 7pm - Pager - [phone] After 7pm go to [email or web address] - password [email or web address]  And look for the night coverage person covering for me after hours  [facility] Office  [phone]

## 2016-07-23 NOTE — Progress Notes (Signed)
Patient alert though lethargic at times- confused x4- Precedex infusing at .7-patient resting comfortably- vitals stable- NSR on monitor.  Foley was ordered and inserted due to urinary retention.  Adequate urine output.

## 2016-07-24 ENCOUNTER — Inpatient Hospital Stay (HOSPITAL_COMMUNITY): Payer: Medicaid Other

## 2016-07-24 ENCOUNTER — Encounter (HOSPITAL_COMMUNITY)
Admission: EM | Disposition: A | Discharge status: Home / Self Care | Payer: Self-pay | Source: Home / Self Care | Attending: Internal Medicine

## 2016-07-24 DIAGNOSIS — F1123 Opioid dependence with withdrawal: Secondary | ICD-10-CM

## 2016-07-24 DIAGNOSIS — F1193 Opioid use, unspecified with withdrawal: Secondary | ICD-10-CM

## 2016-07-24 LAB — GLUCOSE, CAPILLARY
GLUCOSE-CAPILLARY: 120 mg/dL — AB (ref 65–99)
GLUCOSE-CAPILLARY: 86 mg/dL (ref 65–99)
Glucose-Capillary: 92 mg/dL (ref 65–99)
Glucose-Capillary: 117 mg/dL — ABNORMAL HIGH (ref 65–99)
Glucose-Capillary: 104 mg/dL — ABNORMAL HIGH (ref 65–99)
Glucose-Capillary: 133 mg/dL — ABNORMAL HIGH (ref 65–99)

## 2016-07-24 LAB — CBC
HEMATOCRIT: 29.1 % — AB (ref 39.0–52.0)
HEMOGLOBIN: 10 g/dL — AB (ref 13.0–17.0)
MCH: 28.4 pg (ref 26.0–34.0)
MCHC: 34.4 g/dL (ref 30.0–36.0)
MCV: 82.7 fL (ref 78.0–100.0)
Platelets: 151 10*3/uL (ref 150–400)
RBC: 3.52 MIL/uL — AB (ref 4.22–5.81)
RDW: 15.9 % — ABNORMAL HIGH (ref 11.5–15.5)
WBC: 7.5 10*3/uL (ref 4.0–10.5)

## 2016-07-24 LAB — URINE CULTURE: Culture: NO GROWTH

## 2016-07-24 LAB — COMPREHENSIVE METABOLIC PANEL
ALK PHOS: 139 U/L — AB (ref 38–126)
ALT: 19 U/L (ref 17–63)
AST: 23 U/L (ref 15–41)
Albumin: 1.4 g/dL — ABNORMAL LOW (ref 3.5–5.0)
Anion gap: 9 (ref 5–15)
BUN: 21 mg/dL — AB (ref 6–20)
CALCIUM: 7.5 mg/dL — AB (ref 8.9–10.3)
CO2: 18 mmol/L — AB (ref 22–32)
CREATININE: 1.08 mg/dL (ref 0.61–1.24)
Chloride: 107 mmol/L (ref 101–111)
Glucose, Bld: 97 mg/dL (ref 65–99)
Potassium: 3.8 mmol/L (ref 3.5–5.1)
Sodium: 134 mmol/L — ABNORMAL LOW (ref 135–145)
Total Bilirubin: 2.1 mg/dL — ABNORMAL HIGH (ref 0.3–1.2)
Total Protein: 6 g/dL — ABNORMAL LOW (ref 6.5–8.1)

## 2016-07-24 SURGERY — ECHOCARDIOGRAM, TRANSESOPHAGEAL
Anesthesia: Monitor Anesthesia Care

## 2016-07-24 MED ORDER — LORAZEPAM 2 MG/ML IJ SOLN
1.0000 mg | INTRAMUSCULAR | Status: DC | PRN
  Administered 2016-07-24 – 2016-07-25 (×4): 2 mg via INTRAVENOUS
  Administered 2016-07-26: 1 mg via INTRAVENOUS
  Administered 2016-07-26: 2 mg via INTRAVENOUS
  Administered 2016-07-26: 4 mg via INTRAVENOUS
  Administered 2016-07-26: 2 mg via INTRAVENOUS
  Administered 2016-07-27: 4 mg via INTRAVENOUS
  Administered 2016-07-27 – 2016-07-28 (×4): 2 mg via INTRAVENOUS
  Filled 2016-07-24: qty 1
  Filled 2016-07-24: qty 2
  Filled 2016-07-24 (×8): qty 1
  Filled 2016-07-24: qty 2
  Filled 2016-07-24: qty 1
  Filled 2016-07-24: qty 2
  Filled 2016-07-24: qty 1

## 2016-07-24 MED ORDER — MIDAZOLAM HCL 2 MG/2ML IJ SOLN
INTRAMUSCULAR | Status: AC
  Filled 2016-07-24: qty 2

## 2016-07-24 MED ORDER — MIDAZOLAM HCL 2 MG/2ML IJ SOLN
INTRAMUSCULAR | Status: AC
  Administered 2016-07-24: 1 mg
  Filled 2016-07-24: qty 2

## 2016-07-24 MED ORDER — MIDAZOLAM HCL 2 MG/2ML IJ SOLN
2.0000 mg | INTRAMUSCULAR | Status: DC | PRN
  Administered 2016-07-24: 1 mg via INTRAVENOUS

## 2016-07-24 MED ORDER — DEXMEDETOMIDINE HCL IN NACL 400 MCG/100ML IV SOLN
0.2000 ug/kg/h | INTRAVENOUS | Status: AC
  Administered 2016-07-24 (×3): 1 ug/kg/h via INTRAVENOUS
  Administered 2016-07-25: 1.2 ug/kg/h via INTRAVENOUS
  Administered 2016-07-25: 1.1 ug/kg/h via INTRAVENOUS
  Filled 2016-07-24 (×4): qty 100

## 2016-07-24 MED ORDER — DEXTROSE-NACL 5-0.9 % IV SOLN
INTRAVENOUS | Status: DC
  Administered 2016-07-24 – 2016-07-26 (×4): via INTRAVENOUS

## 2016-07-24 NOTE — Progress Notes (Signed)
Pt agitation increasing throughout this morning. Precedex maxed at 0.7 per MD order. Pt attempting to get out of bed & remove mittens/equipment. Verbally aggressive towards staff. MD notified. Received orders for versed, increased precedex parameters, & safety sitter. Orders carried out. Nursing will continue to monitor.

## 2016-07-24 NOTE — Progress Notes (Signed)
eLink Physician-Brief Progress Note Patient Name: Johnathan MaduroChristopher Liddicoat DOB: [birth year] MRN: [id number]   Date of Service  07/24/2016  HPI/Events of Note  Continued etoh WD . precedex to 1.5 max, BP, HR tolerating Add prn versed We oriented him  We talked about his political views and it seem to calm him down also Will allow some water Still requires restraints   eICU Interventions       Intervention Category Major Interventions: Change in mental status - evaluation and management  Romone Shaff J. 07/24/2016, 6:06 AM

## 2016-07-24 NOTE — Progress Notes (Signed)
Patient agitated trying to get OOB. Easily redirected and medicated for agitation.

## 2016-07-24 NOTE — Progress Notes (Signed)
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, CNS infection  Interval History: repeat blood culture ngtd from 7/15; afebrile with Tmax 100.1; on precedex in ICU now. Cefepime day 6  Physical Exam: Constitutional:  Vitals:   07/24/16 0700 07/24/16 0754  BP: (!) 158/96 (!) 153/98  Pulse: (!) 58   Resp: (!) 26   Temp:  98.4 F (36.9 C)   patient opens eyes Eyes: anicteric HENT: no thrush, mucous membranes dry Respiratory: Normal respiratory effort; CTA B Cardiovascular: RRR GI: soft, nt, nd  Review of Systems: Unable to perform due to patient factors  Lab Results  Component Value Date   WBC 7.5 07/24/2016   HGB 10.0 (L) 07/24/2016   HCT 29.1 (L) 07/24/2016   MCV 82.7 07/24/2016   PLT 151 07/24/2016    Lab Results  Component Value Date   CREATININE 1.08 07/24/2016   BUN 21 (H) 07/24/2016   NA 134 (L) 07/24/2016   K 3.8 07/24/2016   CL 107 07/24/2016   CO2 18 (L) 07/24/2016    Lab Results  Component Value Date   ALT 19 07/24/2016   AST 23 07/24/2016   ALKPHOS 139 (H) 07/24/2016     Microbiology: Recent Results (from the past 240 hour(s))  Culture, blood (Routine x 2)     Status: Abnormal   Collection Time: 07/18/16 10:55 PM  Result Value Ref Range Status   Specimen Description BLOOD RIGHT ARM  Final   Special Requests IN PEDIATRIC BOTTLE Blood Culture adequate volume  Final   Culture  Setup Time   Final    GRAM POSITIVE COCCI IN CLUSTERS IN PEDIATRIC BOTTLE CRITICAL RESULT CALLED TO, READ BACK BY AND VERIFIED WITH: K HAMMONS,PHARMD AT 1619 07/19/16 BY L BENFIELD    Culture (A)  Final    STAPHYLOCOCCUS AUREUS SUSCEPTIBILITIES PERFORMED ON PREVIOUS CULTURE WITHIN THE LAST 5 DAYS.    Report Status 07/21/2016 FINAL  Final  Blood Culture ID Panel (Reflexed)     Status: Abnormal   Collection Time: 07/18/16 10:55 PM  Result Value Ref Range Status   Enterococcus species NOT  DETECTED NOT DETECTED Final   Listeria monocytogenes NOT DETECTED NOT DETECTED Final   Staphylococcus species DETECTED (A) NOT DETECTED Final    Comment: CRITICAL RESULT CALLED TO, READ BACK BY AND VERIFIED WITH: K HAMMONS, PHARMD AT 1619 07/19/16 BY L BENFIELD    Staphylococcus aureus DETECTED (A) NOT DETECTED Final    Comment: Methicillin (oxacillin) susceptible Staphylococcus aureus (MSSA). Preferred therapy is anti staphylococcal beta lactam antibiotic (Cefazolin or Nafcillin), unless clinically contraindicated. CRITICAL RESULT CALLED TO, READ BACK BY AND VERIFIED WITH: K HAMMONS,PHARMD AT 1619 07/19/16 BY L BENFIELD    Methicillin resistance NOT DETECTED NOT DETECTED Final   Streptococcus species NOT DETECTED NOT DETECTED Final   Streptococcus agalactiae NOT DETECTED NOT DETECTED Final   Streptococcus pneumoniae NOT DETECTED NOT DETECTED Final   Streptococcus pyogenes NOT DETECTED NOT DETECTED Final   Acinetobacter baumannii NOT DETECTED NOT DETECTED Final   Enterobacteriaceae species NOT DETECTED NOT DETECTED Final   Enterobacter cloacae complex NOT DETECTED NOT DETECTED Final   Escherichia coli NOT DETECTED NOT DETECTED Final   Klebsiella oxytoca NOT DETECTED NOT DETECTED Final   Klebsiella pneumoniae NOT DETECTED NOT DETECTED Final   Proteus species NOT DETECTED NOT DETECTED Final   Serratia marcescens NOT DETECTED NOT DETECTED Final   Carbapenem resistance NOT DETECTED NOT DETECTED  Final   Haemophilus influenzae NOT DETECTED NOT DETECTED Final   Neisseria meningitidis NOT DETECTED NOT DETECTED Final   Pseudomonas aeruginosa DETECTED (A) NOT DETECTED Final    Comment: CRITICAL RESULT CALLED TO, READ BACK BY AND VERIFIED WITH: K HAMMONS,PHARMD AT 1619 07/19/16 BY L BENFIELD    Candida albicans NOT DETECTED NOT DETECTED Final   Candida glabrata NOT DETECTED NOT DETECTED Final   Candida krusei NOT DETECTED NOT DETECTED Final   Candida parapsilosis NOT DETECTED NOT DETECTED Final     Candida tropicalis NOT DETECTED NOT DETECTED Final  Culture, blood (Routine x 2)     Status: Abnormal   Collection Time: 07/18/16 11:45 PM  Result Value Ref Range Status   Specimen Description BLOOD LEFT ARM  Final   Special Requests   Final    BOTTLES DRAWN AEROBIC AND ANAEROBIC Blood Culture adequate volume   Culture  Setup Time   Final    GRAM POSITIVE COCCI IN CLUSTERS IN BOTH AEROBIC AND ANAEROBIC BOTTLES CRITICAL VALUE NOTED.  VALUE IS CONSISTENT WITH PREVIOUSLY REPORTED AND CALLED VALUE.    Culture STAPHYLOCOCCUS AUREUS PSEUDOMONAS AERUGINOSA  (A)  Final   Report Status 07/21/2016 FINAL  Final   Organism ID, Bacteria STAPHYLOCOCCUS AUREUS  Final   Organism ID, Bacteria PSEUDOMONAS AERUGINOSA  Final      Susceptibility   Pseudomonas aeruginosa - MIC*    CEFTAZIDIME 2 SENSITIVE Sensitive     CIPROFLOXACIN <=0.25 SENSITIVE Sensitive     GENTAMICIN <=1 SENSITIVE Sensitive     IMIPENEM 1 SENSITIVE Sensitive     PIP/TAZO 8 SENSITIVE Sensitive     CEFEPIME 2 SENSITIVE Sensitive     * PSEUDOMONAS AERUGINOSA   Staphylococcus aureus - MIC*    CIPROFLOXACIN <=0.5 SENSITIVE Sensitive     ERYTHROMYCIN 1 INTERMEDIATE Intermediate     GENTAMICIN <=0.5 SENSITIVE Sensitive     OXACILLIN <=0.25 SENSITIVE Sensitive     TETRACYCLINE <=1 SENSITIVE Sensitive     VANCOMYCIN 1 SENSITIVE Sensitive     TRIMETH/SULFA <=10 SENSITIVE Sensitive     CLINDAMYCIN RESISTANT Resistant     RIFAMPIN <=0.5 SENSITIVE Sensitive     Inducible Clindamycin POSITIVE Resistant     * STAPHYLOCOCCUS AUREUS  CSF culture     Status: None   Collection Time: 07/19/16  4:07 AM  Result Value Ref Range Status   Specimen Description CSF  Final   Special Requests NONE  Final   Gram Stain   Final    CYTOSPIN SMEAR WBC PRESENT, PREDOMINANTLY PMN NO ORGANISMS SEEN    Culture NO GROWTH 3 DAYS  Final   Report Status 07/22/2016 FINAL  Final  MRSA PCR Screening     Status: None   Collection Time: 07/19/16  5:26 AM   Result Value Ref Range Status   MRSA by PCR NEGATIVE NEGATIVE Final    Comment:        The GeneXpert MRSA Assay (FDA approved for NASAL specimens only), is one component of a comprehensive MRSA colonization surveillance program. It is not intended to diagnose MRSA infection nor to guide or monitor treatment for MRSA infections.   Respiratory Panel by PCR     Status: None   Collection Time: 07/19/16 12:19 PM  Result Value Ref Range Status   Adenovirus NOT DETECTED NOT DETECTED Final   Coronavirus 229E NOT DETECTED NOT DETECTED Final   Coronavirus HKU1 NOT DETECTED NOT DETECTED Final   Coronavirus NL63 NOT DETECTED NOT DETECTED Final   Coronavirus  OC43 NOT DETECTED NOT DETECTED Final   Metapneumovirus NOT DETECTED NOT DETECTED Final   Rhinovirus / Enterovirus NOT DETECTED NOT DETECTED Final   Influenza A NOT DETECTED NOT DETECTED Final   Influenza B NOT DETECTED NOT DETECTED Final   Parainfluenza Virus 1 NOT DETECTED NOT DETECTED Final   Parainfluenza Virus 2 NOT DETECTED NOT DETECTED Final   Parainfluenza Virus 3 NOT DETECTED NOT DETECTED Final   Parainfluenza Virus 4 NOT DETECTED NOT DETECTED Final   Respiratory Syncytial Virus NOT DETECTED NOT DETECTED Final   Bordetella pertussis NOT DETECTED NOT DETECTED Final   Chlamydophila pneumoniae NOT DETECTED NOT DETECTED Final   Mycoplasma pneumoniae NOT DETECTED NOT DETECTED Final  Culture, Urine     Status: Abnormal   Collection Time: 07/19/16 10:38 PM  Result Value Ref Range Status   Specimen Description URINE, CLEAN CATCH  Final   Special Requests NONE  Final   Culture MULTIPLE SPECIES PRESENT, SUGGEST RECOLLECTION (A)  Final   Report Status 07/21/2016 FINAL  Final  Culture, blood (routine x 2)     Status: None (Preliminary result)   Collection Time: 07/20/16  7:02 AM  Result Value Ref Range Status   Specimen Description BLOOD LEFT HAND  Final   Special Requests   Final    BOTTLES DRAWN AEROBIC ONLY Blood Culture adequate  volume   Culture NO GROWTH 3 DAYS  Final   Report Status PENDING  Incomplete  Culture, blood (routine x 2)     Status: Abnormal   Collection Time: 07/20/16  7:03 AM  Result Value Ref Range Status   Specimen Description BLOOD RIGHT HAND  Final   Special Requests IN PEDIATRIC BOTTLE Blood Culture adequate volume  Final   Culture  Setup Time   Final    GRAM POSITIVE COCCI IN CLUSTERS IN PEDIATRIC BOTTLE CRITICAL RESULT CALLED TO, READ BACK BY AND VERIFIED WITH: TO JBATCHELDER(PHARMd) BY TCLEVELAND 07/21/2016 AT 4:57AM    Culture (A)  Final    STAPHYLOCOCCUS AUREUS SUSCEPTIBILITIES PERFORMED ON PREVIOUS CULTURE WITHIN THE LAST 5 DAYS.    Report Status 07/23/2016 FINAL  Final  Blood Culture ID Panel (Reflexed)     Status: Abnormal   Collection Time: 07/20/16  7:03 AM  Result Value Ref Range Status   Enterococcus species NOT DETECTED NOT DETECTED Final   Listeria monocytogenes NOT DETECTED NOT DETECTED Final   Staphylococcus species DETECTED (A) NOT DETECTED Final    Comment: CRITICAL RESULT CALLED TO, READ BACK BY AND VERIFIED WITH: TO JBATCHELDER(PHARMd) BY TCLEVELAND 07/21/2016 AT 4:53AM    Staphylococcus aureus DETECTED (A) NOT DETECTED Final    Comment: Methicillin (oxacillin) susceptible Staphylococcus aureus (MSSA). Preferred therapy is anti staphylococcal beta lactam antibiotic (Cefazolin or Nafcillin), unless clinically contraindicated. CRITICAL RESULT CALLED TO, READ BACK BY AND VERIFIED WITH: TO JBATCHELDER(PHARMd) BY [facility] SpringsCLEVELAND 07/21/2016 AT 4:53AM    Methicillin resistance NOT DETECTED NOT DETECTED Final   Streptococcus species NOT DETECTED NOT DETECTED Final   Streptococcus agalactiae NOT DETECTED NOT DETECTED Final   Streptococcus pneumoniae NOT DETECTED NOT DETECTED Final   Streptococcus pyogenes NOT DETECTED NOT DETECTED Final   Acinetobacter baumannii NOT DETECTED NOT DETECTED Final   Enterobacteriaceae species NOT DETECTED NOT DETECTED Final   Enterobacter cloacae  complex NOT DETECTED NOT DETECTED Final   Escherichia coli NOT DETECTED NOT DETECTED Final   Klebsiella oxytoca NOT DETECTED NOT DETECTED Final   Klebsiella pneumoniae NOT DETECTED NOT DETECTED Final   Proteus species NOT DETECTED NOT DETECTED Final  Serratia marcescens NOT DETECTED NOT DETECTED Final   Haemophilus influenzae NOT DETECTED NOT DETECTED Final   Neisseria meningitidis NOT DETECTED NOT DETECTED Final   Pseudomonas aeruginosa NOT DETECTED NOT DETECTED Final   Candida albicans NOT DETECTED NOT DETECTED Final   Candida glabrata NOT DETECTED NOT DETECTED Final   Candida krusei NOT DETECTED NOT DETECTED Final   Candida parapsilosis NOT DETECTED NOT DETECTED Final   Candida tropicalis NOT DETECTED NOT DETECTED Final  Culture, blood (single)     Status: None (Preliminary result)   Collection Time: 07/21/16  1:44 PM  Result Value Ref Range Status   Specimen Description BLOOD BLOOD RIGHT HAND  Final   Special Requests   Final    BOTTLES DRAWN AEROBIC AND ANAEROBIC Blood Culture adequate volume   Culture NO GROWTH 2 DAYS  Final   Report Status PENDING  Incomplete  MRSA PCR Screening     Status: None   Collection Time: 07/23/16 10:30 AM  Result Value Ref Range Status   MRSA by PCR NEGATIVE NEGATIVE Final    Comment:        The GeneXpert MRSA Assay (FDA approved for NASAL specimens only), is one component of a comprehensive MRSA colonization surveillance program. It is not intended to diagnose MRSA infection nor to guide or monitor treatment for MRSA infections.     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  On cefepime alone for both.  Repeat blood culture ngtd.   Continue cefepime To get a TEE when consent obtained  2.  Medication monitoring - creat remains wnl, wbc wnl.  Will continue to monitor

## 2016-07-24 NOTE — Progress Notes (Addendum)
[facility] MEDICINE CONSULT   Name: Johnathan [name] MRN: [id number] DOB: Oct 07, 1970    ADMISSION DATE:  07/19/2016 CONSULTATION DATE:  07/23/2016  REFERRING MD:  Catha Gosselin  CHIEF COMPLAINT:  Need for Precedex to manage withdrawal from ETOH/ IVD,  HISTORY OF PRESENT ILLNESS:   Johnathan [name] a 45 y.o.male,w Hep C?, IVDA, (recently injected opana) apparently c/o fever intermittently for  1 week, and redness over the dorsum of the right foot and slight swelling. Pt notes injecting Opana about 2 days ago and cellulitis in right foot noted on admission 7/13 with labs showing EKG and abnormal LFTs with hyponatremia 123 and right lower lobe infiltrate on chest x-ray  Found to have MSSA bacteremia, possible meningitis, and LE cellulitis.  Echocardiogram Showed EF 55-60%, grade 1 diastolic dysfunction, tricuspid valve with mobile vegetation. On 7/17 He developed acute encephalopathy with agitation, becoming belligerent and combative. He denies ETOH abuse, but admitted to IVDA. He was placed on CIWA protocol, however, scores remained 19-23, patient was not responsive  to ativan, transferred to ICU for precedex gtt    SUBJECTIVE:   Sedated on Precedex drip. Agitated when aroused. Afebrile, on room air  VITAL SIGNS: BP (!) 144/95   Pulse (!) 50   Temp 98.4 F (36.9 C) (Axillary)   Resp (!) 28   Ht 5\' 11"  (1.803 m)   Wt 161 lb 2.5 oz (73.1 kg)   SpO2 99%   BMI 22.48 kg/m   HEMODYNAMICS:    VENTILATOR SETTINGS:    INTAKE / OUTPUT: I/O last 3 completed shifts: In: 749.9 [I.V.:399.9; IV Piggyback:350] Out: 605 [Urine:605]  PHYSICAL EXAMINATION: General:  Chronically ill-appearing, numerous tattoos, no respiratory distress, sedated on Precedex Neuro:  Sedated, agitated when aroused. Unable to assess orientation HEENT:  Normocephalic, atraumatic Cardiovascular:  Tachy S1, S2, No RMG Lungs:  Clear throughout, diminished per bases Abdomen:  Soft and flat,  BS +, non-distended Musculoskeletal:  No obvious deformities notes, muscle wasting Skin:  Dry and intact, Right foot cellulitis, pulses good with brisk refill  LABS:  BMET  Recent Labs Lab 07/22/16 0144 07/23/16 0751 07/23/16 1129 07/24/16 0250  NA 132* 134*  --  134*  K 3.6 4.0  --  3.8  CL 100* 107  --  107  CO2 26 21*  --  18*  BUN 16 12  --  21*  CREATININE 1.01 0.80 0.85 1.08  GLUCOSE 106* 77  --  97    Electrolytes  Recent Labs Lab 07/21/16 1435 07/22/16 0144 07/23/16 0751 07/23/16 1129 07/24/16 0250  CALCIUM  --  7.1* 7.3*  --  7.5*  MG 1.8 1.9  --  1.9  --   PHOS  --   --   --  2.8  --     CBC  Recent Labs Lab 07/23/16 0751 07/23/16 1129 07/24/16 0250  WBC 9.2 7.8 7.5  HGB 10.2* 10.1* 10.0*  HCT 30.2* 30.0* 29.1*  PLT 144* 134* 151    Coag's  Recent Labs Lab 07/18/16 2351  INR 1.41    Sepsis Markers  Recent Labs Lab 07/20/16 0656 07/20/16 1032 07/23/16 1129  LATICACIDVEN 0.9 1.8 0.9  PROCALCITON  --   --  0.79    ABG No results for input(s): PHART, PCO2ART, PO2ART in the last 168 hours.  Liver Enzymes  Recent Labs Lab 07/22/16 0144 07/23/16 0751 07/24/16 0250  AST 26 29 23   ALT 28 21 19   ALKPHOS 203* 161* 139*  BILITOT 5.6* 2.0*  2.1*  ALBUMIN 1.7* 1.4* 1.4*    Cardiac Enzymes  Recent Labs Lab 07/19/16 0548 07/19/16 1108 07/19/16 1902  TROPONINI <0.03 0.06* <0.03    Glucose  Recent Labs Lab 07/23/16 1542 07/23/16 2017 07/24/16 0006 07/24/16 0359 07/24/16 0748 07/24/16 1148  GLUCAP 74 84 86 92 117* 104*    Imaging Dg Chest Port 1 View  Result Date: 07/24/2016 CLINICAL DATA:  Shortness of breath . EXAM: PORTABLE CHEST 1 VIEW COMPARISON:  07/23/2016 .  07/19/2016.  11/06/2003. FINDINGS: Cardiomegaly. Normal pulmonary vascularity. Persistent right lower lobe consolidation consistent with pneumonia. Cavitation may be present on today's exam. Mild left base atelectasis. Density noted over the left mid  lung may represent atelectasis or small amount of fluid in the fissure. Left costophrenic angle incompletely imaged. No pneumothorax . No acute bony abnormality. IMPRESSION: 1. Persistent right lower lobe consolidation consistent with pneumonia. Cavitation may be present. Contrast-enhanced chest CT may prove useful for further evaluation. Persistent right-sided pleural effusion. 2. Left base subsegmental atelectasis. Density noted over the left mid lung field on today's exam may represent atelectasis. This could also represent fissural fluid. Electronically Signed   By: Maisie Fushomas  Register   On: 07/24/2016 07:04     STUDIES:  LP 7/13 >> High WBC and protein>> suspect meningitis but Cx no growth EchocardiogramShowed EF 55-60%, grade 1 diastolic dysfunction, tricuspid valve with mobile vegetation>> Needs TEE but refusing CT Head 7/16>> Progressive atrophy, negative for edema to suggest septic emboli Renal ultrasound 7/13>> showed mild right pelvicaliectasis with ureteral jet noted and urinary bladder excluding significant ureteral obstruction  CULTURES: CSF 7/13 >> No growth Blood cultures >> 07/18/16 MSSA  (2/2), Pseudomonas aeruginosa (however gram stain showed no GN) RVP 7/13>> Unremarkable Repeat Blood cultures >>  07/20/16: 1/2 MSSA  ANTIBIOTICS: Vanc 7/13>> 7/13 Zosyn 7/13>> 7/13 Nafcillin 7/13>> 7/16 Cefepime 7/16>>  SIGNIFICANT EVENTS: 7/17>> Transfer to ICU for precedex infusion   LINES/TUBES: PIV  DISCUSSION: Pt. With MSSA bacteremia,withdrawing from ETOH/IV drugs. He was transferred to ICU for Precedex infusion   ASSESSMENT / PLAN:  PULMONARY A: Right lower lobe pneumonia/ septic emboli versus aspiration pneumonia with cough -Cavitation favors septic emboli P: Will image with CT chest or ultrasound if fevers for thoracenteses   Mobilize when able   CARDIOVASCULAR A:  Sepsis secondary to MSSA bacteremia- TV endocarditis EF>> 55-60% grade 1 diastolic dysfunction,  tricuspid valve with mobile vegetation  P: TEE  ? need  RENAL A:   Acute renal failure Creatinine upon admission 3.17, improved Hypokalemia Hyponatremia  P:   Trend BMET Replete Electrolytes as needed Avoid Nephrotoxic drugs   GASTROINTESTINAL A:   Protein calorie malnutrition-mild P:   NWG:[id number]:Pepcid Monitor LFT's on precedex     HEMATOLOGIC A:   Anemia P:  Trend CBC Transfuse for HGB <7 DVT prophylaxis:   INFECTIOUS A:   ? UTI>>Rare bacteria, 6-30 WBC, negative leukocytes and negative nitrites MSSA bacteremia endocarditis/possible meningitis/pneumonia/ R foot cellulitis  Pseudomonas bacteremia P:   Trend fever/ WBC Curve TEE when able Appreciate ID input -ct cefepime  ENDOCRINE A:   Abnormal TSH>>TSH 0.208, FT4 2.51 P:   CBG Q 4 SSI Repeat thyroid testing 3-4 weeks  NEUROLOGIC A:   Acute encephalopathy with agitation Withdrawal/ polysubstance abuse P:   Precedex for agitation/ combative abusive behavior RASS goal: 0 to 1 while on precedex Neuro Checks per unit  Routine Will d/c morphine and hydrocodone while on precedex - may need to add methadone eventually    FAMILY  -  Updates: No family at bedside  - Inter-disciplinary family meet or Palliative Care meeting due by:  7/25 .  My cc time x 71m Cyril Mourning MD. FCCP. [facility] Pager [phone] If no response call [phone]    07/24/2016, 1:11 PM

## 2016-07-25 ENCOUNTER — Inpatient Hospital Stay (HOSPITAL_COMMUNITY): Payer: Medicaid Other

## 2016-07-25 DIAGNOSIS — A819 Atypical virus infection of central nervous system, unspecified: Secondary | ICD-10-CM

## 2016-07-25 LAB — GLUCOSE, CAPILLARY
GLUCOSE-CAPILLARY: 134 mg/dL — AB (ref 65–99)
GLUCOSE-CAPILLARY: 135 mg/dL — AB (ref 65–99)
GLUCOSE-CAPILLARY: 140 mg/dL — AB (ref 65–99)
Glucose-Capillary: 155 mg/dL — ABNORMAL HIGH (ref 65–99)
Glucose-Capillary: 159 mg/dL — ABNORMAL HIGH (ref 65–99)
Glucose-Capillary: 106 mg/dL — ABNORMAL HIGH (ref 65–99)
Glucose-Capillary: 115 mg/dL — ABNORMAL HIGH (ref 65–99)

## 2016-07-25 LAB — CBC
HEMATOCRIT: 30.4 % — AB (ref 39.0–52.0)
Hemoglobin: 10.2 g/dL — ABNORMAL LOW (ref 13.0–17.0)
MCH: 27.7 pg (ref 26.0–34.0)
MCHC: 33.6 g/dL (ref 30.0–36.0)
MCV: 82.6 fL (ref 78.0–100.0)
Platelets: 176 10*3/uL (ref 150–400)
RBC: 3.68 MIL/uL — ABNORMAL LOW (ref 4.22–5.81)
RDW: 15.5 % (ref 11.5–15.5)
WBC: 8 10*3/uL (ref 4.0–10.5)

## 2016-07-25 LAB — MAGNESIUM: Magnesium: 2.1 mg/dL (ref 1.7–2.4)

## 2016-07-25 LAB — CULTURE, BLOOD (ROUTINE X 2)
CULTURE: NO GROWTH
Special Requests: ADEQUATE

## 2016-07-25 LAB — BASIC METABOLIC PANEL
ANION GAP: 7 (ref 5–15)
BUN: 26 mg/dL — ABNORMAL HIGH (ref 6–20)
CALCIUM: 7.7 mg/dL — AB (ref 8.9–10.3)
CO2: 21 mmol/L — AB (ref 22–32)
Chloride: 109 mmol/L (ref 101–111)
Creatinine, Ser: 0.96 mg/dL (ref 0.61–1.24)
GFR calc Af Amer: >60 mL/min (ref >60)
GFR calc non Af Amer: >60 mL/min (ref >60)
GLUCOSE: 155 mg/dL — AB (ref 65–99)
POTASSIUM: 3.7 mmol/L (ref 3.5–5.1)
Sodium: 137 mmol/L (ref 135–145)

## 2016-07-25 LAB — PHOSPHORUS: Phosphorus: 3.9 mg/dL (ref 2.5–4.6)

## 2016-07-25 MED ORDER — DEXMEDETOMIDINE HCL IN NACL 400 MCG/100ML IV SOLN
0.2000 ug/kg/h | INTRAVENOUS | Status: DC
  Administered 2016-07-25 – 2016-07-26 (×3): 1 ug/kg/h via INTRAVENOUS
  Administered 2016-07-26: 1.2 ug/kg/h via INTRAVENOUS
  Administered 2016-07-27: 0.4 ug/kg/h via INTRAVENOUS
  Filled 2016-07-25 (×5): qty 100

## 2016-07-25 MED ORDER — CLONIDINE HCL 0.2 MG/24HR TD PTWK
0.2000 mg | MEDICATED_PATCH | TRANSDERMAL | Status: DC
  Administered 2016-07-25 – 2016-08-01 (×2): 0.2 mg via TRANSDERMAL
  Filled 2016-07-25 (×3): qty 1

## 2016-07-25 MED ORDER — THIAMINE HCL 100 MG/ML IJ SOLN
100.0000 mg | Freq: Every day | INTRAMUSCULAR | Status: DC
  Administered 2016-07-25 – 2016-07-29 (×5): 100 mg via INTRAVENOUS
  Filled 2016-07-25 (×3): qty 1
  Filled 2016-07-25: qty 2
  Filled 2016-07-25: qty 1

## 2016-07-25 MED ORDER — FENTANYL CITRATE (PF) 100 MCG/2ML IJ SOLN: 25.0000 ug | INTRAMUSCULAR | Status: DC | PRN

## 2016-07-25 MED ORDER — CHLORHEXIDINE GLUCONATE 0.12 % MT SOLN
15.0000 mL | Freq: Two times a day (BID) | OROMUCOSAL | Status: DC
  Administered 2016-07-25 – 2016-07-28 (×2): 15 mL via OROMUCOSAL
  Filled 2016-07-25: qty 15

## 2016-07-25 MED ORDER — ORAL CARE MOUTH RINSE
15.0000 mL | Freq: Two times a day (BID) | OROMUCOSAL | Status: DC
  Administered 2016-07-25 – 2016-07-28 (×4): 15 mL via OROMUCOSAL

## 2016-07-25 MED ORDER — SODIUM CHLORIDE 0.9 % IV SOLN
0.2000 ug/kg/h | INTRAVENOUS | Status: DC
  Administered 2016-07-25: 1.2 ug/kg/h via INTRAVENOUS
  Filled 2016-07-25: qty 2

## 2016-07-25 MED ORDER — FOLIC ACID 5 MG/ML IJ SOLN
1.0000 mg | Freq: Every day | INTRAMUSCULAR | Status: DC
  Administered 2016-07-25 – 2016-07-29 (×5): 1 mg via INTRAVENOUS
  Filled 2016-07-25 (×5): qty 0.2

## 2016-07-25 MED ORDER — BUPRENORPHINE HCL-NALOXONE HCL 8-2 MG SL SUBL
1.0000 | SUBLINGUAL_TABLET | Freq: Every day | SUBLINGUAL | Status: DC
  Administered 2016-07-28 – 2016-08-08 (×10): 1 via SUBLINGUAL
  Filled 2016-07-25 (×14): qty 1

## 2016-07-25 NOTE — Progress Notes (Signed)
[name]. PULMONARY / CRITICAL CARE MEDICINE CONSULT   Name: Johnathan MaduroChristopher [name] MRN: [id number] DOB: 1970-10-19    ADMISSION DATE:  07/19/2016 CONSULTATION DATE:  07/23/2016  REFERRING MD:  Catha GosselinMikhail  CHIEF COMPLAINT:  Need for Precedex to manage withdrawal from ETOH/ IVD,  HISTORY OF PRESENT ILLNESS:   Johnathan [name] a 46 y.o.male,w Hep C?, IVDA, (recently injected opana) apparently c/o fever intermittently for  1 week, and redness over the dorsum of the right foot and slight swelling. Pt notes injecting Opana about 2 days ago and cellulitis in right foot noted on admission 7/13 with labs showing EKG and abnormal LFTs with hyponatremia 123 and right lower lobe infiltrate on chest x-ray  Found to have MSSA bacteremia, possible meningitis, and LE cellulitis.  Echocardiogram Showed EF 55-60%, grade 1 diastolic dysfunction, tricuspid valve with mobile vegetation. On 7/17 He developed acute encephalopathy with agitation, becoming belligerent and combative. He denies ETOH abuse, but admitted to IVDA. He was placed on CIWA protocol, however, scores remained 19-23, patient was not responsive  to ativan, transferred to ICU for precedex gtt    SUBJECTIVE:  No sig change overnight.  Easily agitated.  Remains on precedex gtt.  BP trending up, SBP 160's  VITAL SIGNS: BP (!) 156/96 (BP Location: Right Arm)   Pulse (!) 55   Temp (!) 96.8 F (36 C) (Axillary)   Resp (!) 32   Ht 5\' 11"  (1.803 m)   Wt 72.1 kg (158 lb 15.2 oz)   SpO2 99%   BMI 22.17 kg/m   HEMODYNAMICS:    VENTILATOR SETTINGS:    INTAKE / OUTPUT: I/O last 3 completed shifts: In: 2615.9 [I.V.:2415.9; IV Piggyback:200] Out: 660 [Urine:660]  PHYSICAL EXAMINATION: General:  Chronically ill-appearing, numerous tattoos, no respiratory distress, sedated on Precedex Neuro:  Sedated, easily agitated per RN, difficult to arouse for me, does not answer questions, MAE spontaneously HEENT:  Normocephalic,  atraumatic Cardiovascular:  Tachy S1, S2, No RMG Lungs:  resps even non labored, diminished bases Abdomen:  Soft and flat, BS +, non-distended Musculoskeletal:  No obvious deformities notes, muscle wasting Skin:  Dry and intact, Right foot cellulitis, pulses good with brisk refill  LABS:  BMET  Recent Labs Lab 07/23/16 0751 07/23/16 1129 07/24/16 0250 07/25/16 0237  NA 134*  --  134* 137  K 4.0  --  3.8 3.7  CL 107  --  107 109  CO2 21*  --  18* 21*  BUN 12  --  21* 26*  CREATININE 0.80 0.85 1.08 0.96  GLUCOSE 77  --  97 155*    Electrolytes  Recent Labs Lab 07/22/16 0144 07/23/16 0751 07/23/16 1129 07/24/16 0250 07/25/16 0237  CALCIUM 7.1* 7.3*  --  7.5* 7.7*  MG 1.9  --  1.9  --  2.1  PHOS  --   --  2.8  --  3.9    CBC  Recent Labs Lab 07/23/16 1129 07/24/16 0250 07/25/16 0237  WBC 7.8 7.5 8.0  HGB 10.1* 10.0* 10.2*  HCT 30.0* 29.1* 30.4*  PLT 134* 151 176    Coag's  Recent Labs Lab 07/18/16 2351  INR 1.41    Sepsis Markers  Recent Labs Lab 07/20/16 0656 07/20/16 1032 07/23/16 1129  LATICACIDVEN 0.9 1.8 0.9  PROCALCITON  --   --  0.79    ABG No results for input(s): PHART, PCO2ART, PO2ART in the last 168 hours.  Liver Enzymes  Recent Labs Lab 07/22/16 0144 07/23/16 0751 07/24/16 0250  AST 26  29 23  ALT 28 21 19   ALKPHOS 203* 161* 139*  BILITOT 5.6* 2.0* 2.1*  ALBUMIN 1.7* 1.4* 1.4*    Cardiac Enzymes  Recent Labs Lab 07/19/16 0548 07/19/16 1108 07/19/16 1902  TROPONINI <0.03 0.06* <0.03    Glucose  Recent Labs Lab 07/24/16 1148 07/24/16 1538 07/24/16 1944 07/25/16 0007 07/25/16 0358 07/25/16 0745  GLUCAP 104* 120* 133* 134* 140* 155*    Imaging Dg Chest Port 1 View  Result Date: 07/25/2016 CLINICAL DATA:  Acute respiratory failure EXAM: PORTABLE CHEST 1 VIEW COMPARISON:  Yesterday FINDINGS: Opacity with central lucency in the peripheral right chest. There is a small to moderate right pleural effusion.  No pneumothorax. Emphysema. Cardiomegaly.  Stable aortic contours. IMPRESSION: 1. Stable from yesterday. 2. Probable pneumonia with concern for cavitation on the right. 3. Small to moderate right pleural effusion. 4.  Emphysema (ICD10-J43.9). Electronically Signed   By: Marnee Spring M.D.   On: 07/25/2016 07:46     STUDIES:  LP 7/13 >> High WBC and protein>> suspect meningitis but Cx no growth EchocardiogramShowed EF 55-60%, grade 1 diastolic dysfunction, tricuspid valve with mobile vegetation>> Needs TEE but refusing CT Head 7/16>> Progressive atrophy, negative for edema to suggest septic emboli Renal ultrasound 7/13>> showed mild right pelvicaliectasis with ureteral jet noted and urinary bladder excluding significant ureteral obstruction  CULTURES: CSF 7/13 >> No growth Blood cultures >> 07/18/16 MSSA  (2/2), Pseudomonas aeruginosa (however gram stain showed no GN) RVP 7/13>> Unremarkable Repeat Blood cultures >>  07/20/16: 1/2 MSSA  ANTIBIOTICS: Vanc 7/13>> 7/13 Zosyn 7/13>> 7/13 Nafcillin 7/13>> 7/16 Cefepime 7/16>>  SIGNIFICANT EVENTS: 7/17>> Transfer to ICU for precedex infusion   LINES/TUBES: PIV  DISCUSSION: Pt. With MSSA bacteremia,withdrawing from ETOH/IV drugs. 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 UTI>>Rare bacteria, 6-30 WBC, negative leukocytes and negative nitrites MSSA bacteremia endocarditis/possible meningitis/pneumonia/ R foot cellulitis  Pseudomonas bacteremia P:   Trend fever/ WBC Curve TEE when able Appreciate ID input -ct cefepime  ENDOCRINE A:   Abnormal TSH>>TSH 0.208, FT4 2.51 P:   CBG Q 4 SSI Repeat thyroid testing 3-4 weeks  NEUROLOGIC A:   Acute encephalopathy with agitation Withdrawal/ polysubstance abuse P:   Precedex for agitation/ combative abusive behavior - wean as able  Add PRN fentanyl  RASS goal: 0 to 1 while on precedex Neuro Checks per unit  Routine may need to consider addition of methadone eventually    FAMILY  - Updates: No family at bedside 7/19  - Inter-disciplinary family meet or Palliative Care meeting due by:  7/25 Dirk Dress, NP 07/25/2016  9:37 AM Pager: [phone] or [phone]

## 2016-07-25 NOTE — Progress Notes (Signed)
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, CNS infection  Interval History: repeat blood culture still ngtd from 7/15; afebrile; on precedex in ICU now Cefepime day 7  Physical Exam: Constitutional:  Vitals:   07/25/16 0746 07/25/16 0800  BP:  (!) 156/96  Pulse:  (!) 55  Resp:  (!) 32  Temp: (!) 96.8 F (36 C)    patient opens eyes  Eyes: anicteric Respiratory: Normal respiratory effort; CTA B Cardiovascular: RRR  Review of Systems: Unable to perform due to patient factors  Lab Results  Component Value Date   WBC 8.0 07/25/2016   HGB 10.2 (L) 07/25/2016   HCT 30.4 (L) 07/25/2016   MCV 82.6 07/25/2016   PLT 176 07/25/2016    Lab Results  Component Value Date   CREATININE 0.96 07/25/2016   BUN 26 (H) 07/25/2016   NA 137 07/25/2016   K 3.7 07/25/2016   CL 109 07/25/2016   CO2 21 (L) 07/25/2016    Lab Results  Component Value Date   ALT 19 07/24/2016   AST 23 07/24/2016   ALKPHOS 139 (H) 07/24/2016     Microbiology: Recent Results (from the past 240 hour(s))  Culture, blood (Routine x 2)     Status: Abnormal   Collection Time: 07/18/16 10:55 PM  Result Value Ref Range Status   Specimen Description BLOOD RIGHT ARM  Final   Special Requests IN PEDIATRIC BOTTLE Blood Culture adequate volume  Final   Culture  Setup Time   Final    GRAM POSITIVE COCCI IN CLUSTERS IN PEDIATRIC BOTTLE CRITICAL RESULT CALLED TO, READ BACK BY AND VERIFIED WITH: K HAMMONS,PHARMD AT 1619 07/19/16 BY L BENFIELD    Culture (A)  Final    STAPHYLOCOCCUS AUREUS SUSCEPTIBILITIES PERFORMED ON PREVIOUS CULTURE WITHIN THE LAST 5 DAYS.    Report Status 07/21/2016 FINAL  Final  Blood Culture ID Panel (Reflexed)     Status: Abnormal   Collection Time: 07/18/16 10:55 PM  Result Value Ref Range Status   Enterococcus species NOT DETECTED NOT DETECTED Final   Listeria monocytogenes NOT DETECTED NOT DETECTED  Final   Staphylococcus species DETECTED (A) NOT DETECTED Final    Comment: CRITICAL RESULT CALLED TO, READ BACK BY AND VERIFIED WITH: K HAMMONS, PHARMD AT 1619 07/19/16 BY L BENFIELD    Staphylococcus aureus DETECTED (A) NOT DETECTED Final    Comment: Methicillin (oxacillin) susceptible Staphylococcus aureus (MSSA). Preferred therapy is anti staphylococcal beta lactam antibiotic (Cefazolin or Nafcillin), unless clinically contraindicated. CRITICAL RESULT CALLED TO, READ BACK BY AND VERIFIED WITH: K HAMMONS,PHARMD AT 1619 07/19/16 BY L BENFIELD    Methicillin resistance NOT DETECTED NOT DETECTED Final   Streptococcus species NOT DETECTED NOT DETECTED Final   Streptococcus agalactiae NOT DETECTED NOT DETECTED Final   Streptococcus pneumoniae NOT DETECTED NOT DETECTED Final   Streptococcus pyogenes NOT DETECTED NOT DETECTED Final   Acinetobacter baumannii NOT DETECTED NOT DETECTED Final   Enterobacteriaceae species NOT DETECTED NOT DETECTED Final   Enterobacter cloacae complex NOT DETECTED NOT DETECTED Final   Escherichia coli NOT DETECTED NOT DETECTED Final   Klebsiella oxytoca NOT DETECTED NOT DETECTED Final   Klebsiella pneumoniae NOT DETECTED NOT DETECTED Final   Proteus species NOT DETECTED NOT DETECTED Final   Serratia marcescens NOT DETECTED NOT DETECTED Final   Carbapenem resistance NOT DETECTED NOT DETECTED Final   Haemophilus influenzae NOT DETECTED NOT DETECTED Final  Neisseria meningitidis NOT DETECTED NOT DETECTED Final   Pseudomonas aeruginosa DETECTED (A) NOT DETECTED Final    Comment: CRITICAL RESULT CALLED TO, READ BACK BY AND VERIFIED WITH: K HAMMONS,PHARMD AT 1619 07/19/16 BY L BENFIELD    Candida albicans NOT DETECTED NOT DETECTED Final   Candida glabrata NOT DETECTED NOT DETECTED Final   Candida krusei NOT DETECTED NOT DETECTED Final   Candida parapsilosis NOT DETECTED NOT DETECTED Final   Candida tropicalis NOT DETECTED NOT DETECTED Final  Culture, blood (Routine x  2)     Status: Abnormal   Collection Time: 07/18/16 11:45 PM  Result Value Ref Range Status   Specimen Description BLOOD LEFT ARM  Final   Special Requests   Final    BOTTLES DRAWN AEROBIC AND ANAEROBIC Blood Culture adequate volume   Culture  Setup Time   Final    GRAM POSITIVE COCCI IN CLUSTERS IN BOTH AEROBIC AND ANAEROBIC BOTTLES CRITICAL VALUE NOTED.  VALUE IS CONSISTENT WITH PREVIOUSLY REPORTED AND CALLED VALUE.    Culture STAPHYLOCOCCUS AUREUS PSEUDOMONAS AERUGINOSA  (A)  Final   Report Status 07/21/2016 FINAL  Final   Organism ID, Bacteria STAPHYLOCOCCUS AUREUS  Final   Organism ID, Bacteria PSEUDOMONAS AERUGINOSA  Final      Susceptibility   Pseudomonas aeruginosa - MIC*    CEFTAZIDIME 2 SENSITIVE Sensitive     CIPROFLOXACIN <=0.25 SENSITIVE Sensitive     GENTAMICIN <=1 SENSITIVE Sensitive     IMIPENEM 1 SENSITIVE Sensitive     PIP/TAZO 8 SENSITIVE Sensitive     CEFEPIME 2 SENSITIVE Sensitive     * PSEUDOMONAS AERUGINOSA   Staphylococcus aureus - MIC*    CIPROFLOXACIN <=0.5 SENSITIVE Sensitive     ERYTHROMYCIN 1 INTERMEDIATE Intermediate     GENTAMICIN <=0.5 SENSITIVE Sensitive     OXACILLIN <=0.25 SENSITIVE Sensitive     TETRACYCLINE <=1 SENSITIVE Sensitive     VANCOMYCIN 1 SENSITIVE Sensitive     TRIMETH/SULFA <=10 SENSITIVE Sensitive     CLINDAMYCIN RESISTANT Resistant     RIFAMPIN <=0.5 SENSITIVE Sensitive     Inducible Clindamycin POSITIVE Resistant     * STAPHYLOCOCCUS AUREUS  CSF culture     Status: None   Collection Time: 07/19/16  4:07 AM  Result Value Ref Range Status   Specimen Description CSF  Final   Special Requests NONE  Final   Gram Stain   Final    CYTOSPIN SMEAR WBC PRESENT, PREDOMINANTLY PMN NO ORGANISMS SEEN    Culture NO GROWTH 3 DAYS  Final   Report Status 07/22/2016 FINAL  Final  MRSA PCR Screening     Status: None   Collection Time: 07/19/16  5:26 AM  Result Value Ref Range Status   MRSA by PCR NEGATIVE NEGATIVE Final    Comment:         The GeneXpert MRSA Assay (FDA approved for NASAL specimens only), is one component of a comprehensive MRSA colonization surveillance program. It is not intended to diagnose MRSA infection nor to guide or monitor treatment for MRSA infections.   Respiratory Panel by PCR     Status: None   Collection Time: 07/19/16 12:19 PM  Result Value Ref Range Status   Adenovirus NOT DETECTED NOT DETECTED Final   Coronavirus 229E NOT DETECTED NOT DETECTED Final   Coronavirus HKU1 NOT DETECTED NOT DETECTED Final   Coronavirus NL63 NOT DETECTED NOT DETECTED Final   Coronavirus OC43 NOT DETECTED NOT DETECTED Final   Metapneumovirus NOT DETECTED NOT DETECTED  Final   Rhinovirus / Enterovirus NOT DETECTED NOT DETECTED Final   Influenza A NOT DETECTED NOT DETECTED Final   Influenza B NOT DETECTED NOT DETECTED Final   Parainfluenza Virus 1 NOT DETECTED NOT DETECTED Final   Parainfluenza Virus 2 NOT DETECTED NOT DETECTED Final   Parainfluenza Virus 3 NOT DETECTED NOT DETECTED Final   Parainfluenza Virus 4 NOT DETECTED NOT DETECTED Final   Respiratory Syncytial Virus NOT DETECTED NOT DETECTED Final   Bordetella pertussis NOT DETECTED NOT DETECTED Final   Chlamydophila pneumoniae NOT DETECTED NOT DETECTED Final   Mycoplasma pneumoniae NOT DETECTED NOT DETECTED Final  Culture, Urine     Status: Abnormal   Collection Time: 07/19/16 10:38 PM  Result Value Ref Range Status   Specimen Description URINE, CLEAN CATCH  Final   Special Requests NONE  Final   Culture MULTIPLE SPECIES PRESENT, SUGGEST RECOLLECTION (A)  Final   Report Status 07/21/2016 FINAL  Final  Culture, blood (routine x 2)     Status: None (Preliminary result)   Collection Time: 07/20/16  7:02 AM  Result Value Ref Range Status   Specimen Description BLOOD LEFT HAND  Final   Special Requests   Final    BOTTLES DRAWN AEROBIC ONLY Blood Culture adequate volume   Culture NO GROWTH 4 DAYS  Final   Report Status PENDING  Incomplete    Culture, blood (routine x 2)     Status: Abnormal   Collection Time: 07/20/16  7:03 AM  Result Value Ref Range Status   Specimen Description BLOOD RIGHT HAND  Final   Special Requests IN PEDIATRIC BOTTLE Blood Culture adequate volume  Final   Culture  Setup Time   Final    GRAM POSITIVE COCCI IN CLUSTERS IN PEDIATRIC BOTTLE CRITICAL RESULT CALLED TO, READ BACK BY AND VERIFIED WITH: TO JBATCHELDER(PHARMd) BY TCLEVELAND 07/21/2016 AT 4:57AM    Culture (A)  Final    STAPHYLOCOCCUS AUREUS SUSCEPTIBILITIES PERFORMED ON PREVIOUS CULTURE WITHIN THE LAST 5 DAYS.    Report Status 07/23/2016 FINAL  Final  Blood Culture ID Panel (Reflexed)     Status: Abnormal   Collection Time: 07/20/16  7:03 AM  Result Value Ref Range Status   Enterococcus species NOT DETECTED NOT DETECTED Final   Listeria monocytogenes NOT DETECTED NOT DETECTED Final   Staphylococcus species DETECTED (A) NOT DETECTED Final    Comment: CRITICAL RESULT CALLED TO, READ BACK BY AND VERIFIED WITH: TO JBATCHELDER(PHARMd) BY TCLEVELAND 07/21/2016 AT 4:53AM    Staphylococcus aureus DETECTED (A) NOT DETECTED Final    Comment: Methicillin (oxacillin) susceptible Staphylococcus aureus (MSSA). Preferred therapy is anti staphylococcal beta lactam antibiotic (Cefazolin or Nafcillin), unless clinically contraindicated. CRITICAL RESULT CALLED TO, READ BACK BY AND VERIFIED WITH: TO JBATCHELDER(PHARMd) BY [facility] HospitalCLEVELAND 07/21/2016 AT 4:53AM    Methicillin resistance NOT DETECTED NOT DETECTED Final   Streptococcus species NOT DETECTED NOT DETECTED Final   Streptococcus agalactiae NOT DETECTED NOT DETECTED Final   Streptococcus pneumoniae NOT DETECTED NOT DETECTED Final   Streptococcus pyogenes NOT DETECTED NOT DETECTED Final   Acinetobacter baumannii NOT DETECTED NOT DETECTED Final   Enterobacteriaceae species NOT DETECTED NOT DETECTED Final   Enterobacter cloacae complex NOT DETECTED NOT DETECTED Final   Escherichia coli NOT DETECTED NOT  DETECTED Final   Klebsiella oxytoca NOT DETECTED NOT DETECTED Final   Klebsiella pneumoniae NOT DETECTED NOT DETECTED Final   Proteus species NOT DETECTED NOT DETECTED Final   Serratia marcescens NOT DETECTED NOT DETECTED Final   Haemophilus  influenzae NOT DETECTED NOT DETECTED Final   Neisseria meningitidis NOT DETECTED NOT DETECTED Final   Pseudomonas aeruginosa NOT DETECTED NOT DETECTED Final   Candida albicans NOT DETECTED NOT DETECTED Final   Candida glabrata NOT DETECTED NOT DETECTED Final   Candida krusei NOT DETECTED NOT DETECTED Final   Candida parapsilosis NOT DETECTED NOT DETECTED Final   Candida tropicalis NOT DETECTED NOT DETECTED Final  Culture, blood (single)     Status: None (Preliminary result)   Collection Time: 07/21/16  1:44 PM  Result Value Ref Range Status   Specimen Description BLOOD BLOOD RIGHT HAND  Final   Special Requests   Final    BOTTLES DRAWN AEROBIC AND ANAEROBIC Blood Culture adequate volume   Culture NO GROWTH 3 DAYS  Final   Report Status PENDING  Incomplete  MRSA PCR Screening     Status: None   Collection Time: 07/23/16 10:30 AM  Result Value Ref Range Status   MRSA by PCR NEGATIVE NEGATIVE Final    Comment:        The GeneXpert MRSA Assay (FDA approved for NASAL specimens only), is one component of a comprehensive MRSA colonization surveillance program. It is not intended to diagnose MRSA infection nor to guide or monitor treatment for MRSA infections.   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  On cefepime alone for both.  Repeat blood culture still ngtd.   Continue cefepime No TEE yet but will need at some point.   2.  Medication monitoring -  creat still wnl, wbc wnl.  Will continue to monitor

## 2016-07-26 DIAGNOSIS — G060 Intracranial abscess and granuloma: Secondary | ICD-10-CM

## 2016-07-26 LAB — BASIC METABOLIC PANEL
Anion gap: 6 (ref 5–15)
BUN: 27 mg/dL — AB (ref 6–20)
CALCIUM: 7.7 mg/dL — AB (ref 8.9–10.3)
CO2: 18 mmol/L — ABNORMAL LOW (ref 22–32)
CREATININE: 0.88 mg/dL (ref 0.61–1.24)
Chloride: 115 mmol/L — ABNORMAL HIGH (ref 101–111)
GFR calc Af Amer: >60 mL/min (ref >60)
GFR calc non Af Amer: >60 mL/min (ref >60)
GLUCOSE: 130 mg/dL — AB (ref 65–99)
Potassium: 3.8 mmol/L (ref 3.5–5.1)
Sodium: 139 mmol/L (ref 135–145)

## 2016-07-26 LAB — GLUCOSE, CAPILLARY
GLUCOSE-CAPILLARY: 129 mg/dL — AB (ref 65–99)
GLUCOSE-CAPILLARY: 137 mg/dL — AB (ref 65–99)
GLUCOSE-CAPILLARY: 103 mg/dL — AB (ref 65–99)
Glucose-Capillary: 91 mg/dL (ref 65–99)
Glucose-Capillary: 93 mg/dL (ref 65–99)

## 2016-07-26 LAB — CBC
HEMATOCRIT: 32.1 % — AB (ref 39.0–52.0)
Hemoglobin: 10.5 g/dL — ABNORMAL LOW (ref 13.0–17.0)
MCH: 27.4 pg (ref 26.0–34.0)
MCHC: 32.7 g/dL (ref 30.0–36.0)
MCV: 83.8 fL (ref 78.0–100.0)
Platelets: 191 10*3/uL (ref 150–400)
RBC: 3.83 MIL/uL — ABNORMAL LOW (ref 4.22–5.81)
RDW: 15.7 % — AB (ref 11.5–15.5)
WBC: 7.7 10*3/uL (ref 4.0–10.5)

## 2016-07-26 LAB — CULTURE, BLOOD (SINGLE)
CULTURE: NO GROWTH
Special Requests: ADEQUATE

## 2016-07-26 MED ORDER — FUROSEMIDE 10 MG/ML IJ SOLN
20.0000 mg | Freq: Once | INTRAMUSCULAR | Status: AC
  Administered 2016-07-26: 20 mg via INTRAVENOUS
  Filled 2016-07-26: qty 2

## 2016-07-26 NOTE — Progress Notes (Signed)
46 year old man with hep C and IV drug use admitted with MSSA and Pseudomonas bacteremia with acute tricuspid valve endocarditis and severe opiate withdrawal C x-ray reviewed and shows right lower lobe cavitating infiltrate   On exam- remains agitated when Precedex turned down, s1s2 normal, clear lungs, right foot is less warm and not tender  Labs show normal electrolytes, no leukocytosis and mild anemia.  Chest x-ray shows small right effusion with cavitation of infiltrate Impression/plan Acute encephalopathy/opiate withdrawal - will add Suboxone and see if we can taper Precedex, ct  clonidine patch   Protein calorie malnutrition-he has not been fed for 3 days but doubt he will leave NG tube in, we'll see if he can be awake enough to eat off Precedex  Acute endocarditis -continue cefepime as single agent he will need antibiotics for 6 weeks, doubt need for TEE here  My cc time x [phone]  Oretha MilchALVA,Jacquan Savas V. MD

## 2016-07-26 NOTE — Progress Notes (Addendum)
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 Cefepime day 8  Physical Exam: Constitutional:  Vitals:   07/26/16 0800 07/26/16 0900  BP: (!) 149/92 (!) 147/96  Pulse: (!) 57 (!) 57  Resp: (!) 25 (!) 28  Temp: 97.7 F (36.5 C)    patient opens eyes, does not respond to commands Eyes: anicteric Respiratory: Normal respiratory effort; CTA B Cardiovascular: RRR Skin: some jaundice  Review of Systems: Unable to perform due to patient factors  Lab Results  Component Value Date   WBC 7.7 07/26/2016   HGB 10.5 (L) 07/26/2016   HCT 32.1 (L) 07/26/2016   MCV 83.8 07/26/2016   PLT 191 07/26/2016    Lab Results  Component Value Date   CREATININE 0.88 07/26/2016   BUN 27 (H) 07/26/2016   NA 139 07/26/2016   K 3.8 07/26/2016   CL 115 (H) 07/26/2016   CO2 18 (L) 07/26/2016    Lab Results  Component Value Date   ALT 19 07/24/2016   AST 23 07/24/2016   ALKPHOS 139 (H) 07/24/2016     Microbiology: Recent Results (from the past 240 hour(s))  Culture, blood (Routine x 2)     Status: Abnormal   Collection Time: 07/18/16 10:55 PM  Result Value Ref Range Status   Specimen Description BLOOD RIGHT ARM  Final   Special Requests IN PEDIATRIC BOTTLE Blood Culture adequate volume  Final   Culture  Setup Time   Final    GRAM POSITIVE COCCI IN CLUSTERS IN PEDIATRIC BOTTLE CRITICAL RESULT CALLED TO, READ BACK BY AND VERIFIED WITH: K HAMMONS,PHARMD AT 1619 07/19/16 BY L BENFIELD    Culture (A)  Final    STAPHYLOCOCCUS AUREUS SUSCEPTIBILITIES PERFORMED ON PREVIOUS CULTURE WITHIN THE LAST 5 DAYS.    Report Status 07/21/2016 FINAL  Final  Blood Culture ID Panel (Reflexed)     Status: Abnormal   Collection Time: 07/18/16 10:55 PM  Result Value Ref Range Status   Enterococcus  species NOT DETECTED NOT DETECTED Final   Listeria monocytogenes NOT DETECTED NOT DETECTED Final   Staphylococcus species DETECTED (A) NOT DETECTED Final    Comment: CRITICAL RESULT CALLED TO, READ BACK BY AND VERIFIED WITH: K HAMMONS, PHARMD AT 1619 07/19/16 BY L BENFIELD    Staphylococcus aureus DETECTED (A) NOT DETECTED Final    Comment: Methicillin (oxacillin) susceptible Staphylococcus aureus (MSSA). Preferred therapy is anti staphylococcal beta lactam antibiotic (Cefazolin or Nafcillin), unless clinically contraindicated. CRITICAL RESULT CALLED TO, READ BACK BY AND VERIFIED WITH: K HAMMONS,PHARMD AT 1619 07/19/16 BY L BENFIELD    Methicillin resistance NOT DETECTED NOT DETECTED Final   Streptococcus species NOT DETECTED NOT DETECTED Final   Streptococcus agalactiae NOT DETECTED NOT DETECTED Final   Streptococcus pneumoniae NOT DETECTED NOT DETECTED Final   Streptococcus pyogenes NOT DETECTED NOT DETECTED Final   Acinetobacter baumannii NOT DETECTED NOT DETECTED Final   Enterobacteriaceae species NOT DETECTED NOT DETECTED Final   Enterobacter cloacae complex NOT DETECTED NOT DETECTED Final   Escherichia coli NOT DETECTED NOT DETECTED Final   Klebsiella oxytoca NOT DETECTED NOT DETECTED Final   Klebsiella pneumoniae NOT DETECTED NOT DETECTED Final   Proteus species NOT DETECTED NOT DETECTED Final   Serratia marcescens NOT DETECTED NOT DETECTED Final   Carbapenem resistance NOT DETECTED NOT  DETECTED Final   Haemophilus influenzae NOT DETECTED NOT DETECTED Final   Neisseria meningitidis NOT DETECTED NOT DETECTED Final   Pseudomonas aeruginosa DETECTED (A) NOT DETECTED Final    Comment: CRITICAL RESULT CALLED TO, READ BACK BY AND VERIFIED WITH: K HAMMONS,PHARMD AT 1619 07/19/16 BY L BENFIELD    Candida albicans NOT DETECTED NOT DETECTED Final   Candida glabrata NOT DETECTED NOT DETECTED Final   Candida krusei NOT DETECTED NOT DETECTED Final   Candida parapsilosis NOT DETECTED NOT  DETECTED Final   Candida tropicalis NOT DETECTED NOT DETECTED Final  Culture, blood (Routine x 2)     Status: Abnormal   Collection Time: 07/18/16 11:45 PM  Result Value Ref Range Status   Specimen Description BLOOD LEFT ARM  Final   Special Requests   Final    BOTTLES DRAWN AEROBIC AND ANAEROBIC Blood Culture adequate volume   Culture  Setup Time   Final    GRAM POSITIVE COCCI IN CLUSTERS IN BOTH AEROBIC AND ANAEROBIC BOTTLES CRITICAL VALUE NOTED.  VALUE IS CONSISTENT WITH PREVIOUSLY REPORTED AND CALLED VALUE.    Culture STAPHYLOCOCCUS AUREUS PSEUDOMONAS AERUGINOSA  (A)  Final   Report Status 07/21/2016 FINAL  Final   Organism ID, Bacteria STAPHYLOCOCCUS AUREUS  Final   Organism ID, Bacteria PSEUDOMONAS AERUGINOSA  Final      Susceptibility   Pseudomonas aeruginosa - MIC*    CEFTAZIDIME 2 SENSITIVE Sensitive     CIPROFLOXACIN <=0.25 SENSITIVE Sensitive     GENTAMICIN <=1 SENSITIVE Sensitive     IMIPENEM 1 SENSITIVE Sensitive     PIP/TAZO 8 SENSITIVE Sensitive     CEFEPIME 2 SENSITIVE Sensitive     * PSEUDOMONAS AERUGINOSA   Staphylococcus aureus - MIC*    CIPROFLOXACIN <=0.5 SENSITIVE Sensitive     ERYTHROMYCIN 1 INTERMEDIATE Intermediate     GENTAMICIN <=0.5 SENSITIVE Sensitive     OXACILLIN <=0.25 SENSITIVE Sensitive     TETRACYCLINE <=1 SENSITIVE Sensitive     VANCOMYCIN 1 SENSITIVE Sensitive     TRIMETH/SULFA <=10 SENSITIVE Sensitive     CLINDAMYCIN RESISTANT Resistant     RIFAMPIN <=0.5 SENSITIVE Sensitive     Inducible Clindamycin POSITIVE Resistant     * STAPHYLOCOCCUS AUREUS  CSF culture     Status: None   Collection Time: 07/19/16  4:07 AM  Result Value Ref Range Status   Specimen Description CSF  Final   Special Requests NONE  Final   Gram Stain   Final    CYTOSPIN SMEAR WBC PRESENT, PREDOMINANTLY PMN NO ORGANISMS SEEN    Culture NO GROWTH 3 DAYS  Final   Report Status 07/22/2016 FINAL  Final  MRSA PCR Screening     Status: None   Collection Time:  07/19/16  5:26 AM  Result Value Ref Range Status   MRSA by PCR NEGATIVE NEGATIVE Final    Comment:        The GeneXpert MRSA Assay (FDA approved for NASAL specimens only), is one component of a comprehensive MRSA colonization surveillance program. It is not intended to diagnose MRSA infection nor to guide or monitor treatment for MRSA infections.   Respiratory Panel by PCR     Status: None   Collection Time: 07/19/16 12:19 PM  Result Value Ref Range Status   Adenovirus NOT DETECTED NOT DETECTED Final   Coronavirus 229E NOT DETECTED NOT DETECTED Final   Coronavirus HKU1 NOT DETECTED NOT DETECTED Final   Coronavirus NL63 NOT DETECTED NOT DETECTED Final   Coronavirus  OC43 NOT DETECTED NOT DETECTED Final   Metapneumovirus NOT DETECTED NOT DETECTED Final   Rhinovirus / Enterovirus NOT DETECTED NOT DETECTED Final   Influenza A NOT DETECTED NOT DETECTED Final   Influenza B NOT DETECTED NOT DETECTED Final   Parainfluenza Virus 1 NOT DETECTED NOT DETECTED Final   Parainfluenza Virus 2 NOT DETECTED NOT DETECTED Final   Parainfluenza Virus 3 NOT DETECTED NOT DETECTED Final   Parainfluenza Virus 4 NOT DETECTED NOT DETECTED Final   Respiratory Syncytial Virus NOT DETECTED NOT DETECTED Final   Bordetella pertussis NOT DETECTED NOT DETECTED Final   Chlamydophila pneumoniae NOT DETECTED NOT DETECTED Final   Mycoplasma pneumoniae NOT DETECTED NOT DETECTED Final  Culture, Urine     Status: Abnormal   Collection Time: 07/19/16 10:38 PM  Result Value Ref Range Status   Specimen Description URINE, CLEAN CATCH  Final   Special Requests NONE  Final   Culture MULTIPLE SPECIES PRESENT, SUGGEST RECOLLECTION (A)  Final   Report Status 07/21/2016 FINAL  Final  Culture, blood (routine x 2)     Status: None   Collection Time: 07/20/16  7:02 AM  Result Value Ref Range Status   Specimen Description BLOOD LEFT HAND  Final   Special Requests   Final    BOTTLES DRAWN AEROBIC ONLY Blood Culture adequate  volume   Culture NO GROWTH 5 DAYS  Final   Report Status 07/25/2016 FINAL  Final  Culture, blood (routine x 2)     Status: Abnormal   Collection Time: 07/20/16  7:03 AM  Result Value Ref Range Status   Specimen Description BLOOD RIGHT HAND  Final   Special Requests IN PEDIATRIC BOTTLE Blood Culture adequate volume  Final   Culture  Setup Time   Final    GRAM POSITIVE COCCI IN CLUSTERS IN PEDIATRIC BOTTLE CRITICAL RESULT CALLED TO, READ BACK BY AND VERIFIED WITH: TO JBATCHELDER(PHARMd) BY TCLEVELAND 07/21/2016 AT 4:57AM    Culture (A)  Final    STAPHYLOCOCCUS AUREUS SUSCEPTIBILITIES PERFORMED ON PREVIOUS CULTURE WITHIN THE LAST 5 DAYS.    Report Status 07/23/2016 FINAL  Final  Blood Culture ID Panel (Reflexed)     Status: Abnormal   Collection Time: 07/20/16  7:03 AM  Result Value Ref Range Status   Enterococcus species NOT DETECTED NOT DETECTED Final   Listeria monocytogenes NOT DETECTED NOT DETECTED Final   Staphylococcus species DETECTED (A) NOT DETECTED Final    Comment: CRITICAL RESULT CALLED TO, READ BACK BY AND VERIFIED WITH: TO JBATCHELDER(PHARMd) BY TCLEVELAND 07/21/2016 AT 4:53AM    Staphylococcus aureus DETECTED (A) NOT DETECTED Final    Comment: Methicillin (oxacillin) susceptible Staphylococcus aureus (MSSA). Preferred therapy is anti staphylococcal beta lactam antibiotic (Cefazolin or Nafcillin), unless clinically contraindicated. CRITICAL RESULT CALLED TO, READ BACK BY AND VERIFIED WITH: TO JBATCHELDER(PHARMd) BY [facility] 07/21/2016 AT 4:53AM    Methicillin resistance NOT DETECTED NOT DETECTED Final   Streptococcus species NOT DETECTED NOT DETECTED Final   Streptococcus agalactiae NOT DETECTED NOT DETECTED Final   Streptococcus pneumoniae NOT DETECTED NOT DETECTED Final   Streptococcus pyogenes NOT DETECTED NOT DETECTED Final   Acinetobacter baumannii NOT DETECTED NOT DETECTED Final   Enterobacteriaceae species NOT DETECTED NOT DETECTED Final   Enterobacter cloacae  complex NOT DETECTED NOT DETECTED Final   Escherichia coli NOT DETECTED NOT DETECTED Final   Klebsiella oxytoca NOT DETECTED NOT DETECTED Final   Klebsiella pneumoniae NOT DETECTED NOT DETECTED Final   Proteus species NOT DETECTED NOT DETECTED Final  Serratia marcescens NOT DETECTED NOT DETECTED Final   Haemophilus influenzae NOT DETECTED NOT DETECTED Final   Neisseria meningitidis NOT DETECTED NOT DETECTED Final   Pseudomonas aeruginosa NOT DETECTED NOT DETECTED Final   Candida albicans NOT DETECTED NOT DETECTED Final   Candida glabrata NOT DETECTED NOT DETECTED Final   Candida krusei NOT DETECTED NOT DETECTED Final   Candida parapsilosis NOT DETECTED NOT DETECTED Final   Candida tropicalis NOT DETECTED NOT DETECTED Final  Culture, blood (single)     Status: None   Collection Time: 07/21/16  1:44 PM  Result Value Ref Range Status   Specimen Description BLOOD BLOOD RIGHT HAND  Final   Special Requests   Final    BOTTLES DRAWN AEROBIC AND ANAEROBIC Blood Culture adequate volume   Culture NO GROWTH 5 DAYS  Final   Report Status 07/26/2016 FINAL  Final  MRSA PCR Screening     Status: None   Collection Time: 07/23/16 10:30 AM  Result Value Ref Range Status   MRSA by PCR NEGATIVE NEGATIVE Final    Comment:        The GeneXpert MRSA Assay (FDA approved for NASAL specimens only), is one component of a comprehensive MRSA colonization surveillance program. It is not intended to diagnose MRSA infection nor to guide or monitor treatment for MRSA infections.   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  On cefepime alone for both.  Repeat blood culture from 7/15 still ngtd.   Continue cefepime TEE  requested but is not needed from an ID standpoint  2.  Medication monitoring - creat wnl, wbc wnl.  monitoring

## 2016-07-26 NOTE — Progress Notes (Signed)
Pharmacy Antibiotic Note  Johnathan [name] is a [age] YOM with hx Hep C, IVDA who presented on 7/13 with R-foot redness/swelling with fevers. The patient is noted to have MSSA + Psuedomonas bacteremia with TTE positive for tricuspid valve IE. ID on board. Pharmacy consulted for Cefepime dosing.   The patient's initial AKI has been noted to be resolved. SCr stable at 0.88, CrC >100 ml/min.   Plan: -Cefepime to 2gm IV q8h -Will continue to follow renal function, culture results, LOT, and antibiotic de-escalation plans   Height: 5\' 11"  (180.3 cm) Weight: 161 lb 2.5 oz (73.1 kg) IBW/kg (Calculated) : 75.3  Temp (24hrs), Avg:97.9 F (36.6 C), Min:97.3 F (36.3 C), Max:98.9 F (37.2 C)   Recent Labs Lab 07/20/16 0656 07/20/16 1032  07/23/16 0751 07/23/16 1129 07/24/16 0250 07/25/16 0237 07/26/16 0211  WBC 7.6  --   < > 9.2 7.8 7.5 8.0 7.7  CREATININE 1.06  --   < > 0.80 0.85 1.08 0.96 0.88  LATICACIDVEN 0.9 1.8  --   --  0.9  --   --   --   < > = values in this interval not displayed.  Estimated Creatinine Clearance: 109.6 mL/min (by C-G formula based on SCr of 0.88 mg/dL).    No Known Allergies  Antimicrobials this admission: Zosyn 7/13 >> 7/13 Vanc 7/13 >> 7/13 Cefepime 7/13 >>  Nafcillin 7/13 >> 7/16  Microbiology results: 7/12 BCx - MSSA + PSA (pan sensitive) 7/13 CSF cx - negative 7/13 RVP- negative 7/13 MRSA PCR - negative 7/13 UCx - negative 7/14 BCx - 1/2 MSSA 7/15 BCx - neg 7/17 Urine- neg   Thank you for allowing pharmacy to be a part of this patient's care.   Fabiana Dromgoole D. Tonae Livolsi, PharmD, BCPS Clinical Pharmacist Pager: [phone] Clinical Phone for 07/26/2016 until 3:30pm: [phone] If after 3:30pm, please call main pharmacy at [phone] 07/26/2016 8:07 AM

## 2016-07-27 ENCOUNTER — Inpatient Hospital Stay (HOSPITAL_COMMUNITY): Payer: Medicaid Other

## 2016-07-27 LAB — MAGNESIUM: Magnesium: 2 mg/dL (ref 1.7–2.4)

## 2016-07-27 LAB — BASIC METABOLIC PANEL
Anion gap: 8 (ref 5–15)
BUN: 27 mg/dL — AB (ref 6–20)
CO2: 17 mmol/L — AB (ref 22–32)
Calcium: 8 mg/dL — ABNORMAL LOW (ref 8.9–10.3)
Chloride: 118 mmol/L — ABNORMAL HIGH (ref 101–111)
Creatinine, Ser: 0.85 mg/dL (ref 0.61–1.24)
GFR calc Af Amer: >60 mL/min (ref >60)
GLUCOSE: 87 mg/dL (ref 65–99)
POTASSIUM: 3.6 mmol/L (ref 3.5–5.1)
Sodium: 143 mmol/L (ref 135–145)

## 2016-07-27 LAB — GLUCOSE, CAPILLARY
GLUCOSE-CAPILLARY: 98 mg/dL (ref 65–99)
GLUCOSE-CAPILLARY: 97 mg/dL (ref 65–99)
Glucose-Capillary: 75 mg/dL (ref 65–99)
Glucose-Capillary: 95 mg/dL (ref 65–99)
Glucose-Capillary: 106 mg/dL — ABNORMAL HIGH (ref 65–99)

## 2016-07-27 LAB — LEGIONELLA PNEUMOPHILA SEROGP 1 UR AG: L. PNEUMOPHILA SEROGP 1 UR AG: NEGATIVE

## 2016-07-27 LAB — CBC
HCT: 31.2 % — ABNORMAL LOW (ref 39.0–52.0)
Hemoglobin: 10 g/dL — ABNORMAL LOW (ref 13.0–17.0)
MCH: 27.1 pg (ref 26.0–34.0)
MCHC: 32.1 g/dL (ref 30.0–36.0)
MCV: 84.6 fL (ref 78.0–100.0)
PLATELETS: 231 10*3/uL (ref 150–400)
RBC: 3.69 MIL/uL — AB (ref 4.22–5.81)
RDW: 16 % — AB (ref 11.5–15.5)
WBC: 6.5 10*3/uL (ref 4.0–10.5)

## 2016-07-27 LAB — PHOSPHORUS: Phosphorus: 4.6 mg/dL (ref 2.5–4.6)

## 2016-07-27 MED ORDER — SODIUM BICARBONATE 8.4 % IV SOLN
INTRAVENOUS | Status: DC
  Filled 2016-07-27: qty 150

## 2016-07-27 MED ORDER — SODIUM CHLORIDE 0.45 % IV SOLN
INTRAVENOUS | Status: DC
  Administered 2016-07-27 – 2016-07-28 (×2): via INTRAVENOUS

## 2016-07-27 MED ORDER — LORAZEPAM 2 MG/ML IJ SOLN
2.0000 mg | Freq: Once | INTRAMUSCULAR | Status: AC
  Administered 2016-07-27: 2 mg via INTRAVENOUS

## 2016-07-27 NOTE — Progress Notes (Signed)
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, CNS infection  Interval History: 1 repeat blood culture remains ngtd from 7/15; afebrile; remains on precedex Cefepime day 9  Physical Exam: Constitutional:  Vitals:   07/27/16 0800 07/27/16 0837  BP: 136/87   Pulse: 78   Resp: (!) 37   Temp: 98.7 F (37.1 C) 97.9 F (36.6 C)   patient opens eyes, does not respond to commands Eyes: anicteric Respiratory: Normal respiratory effort; CTA B Cardiovascular: RRR  Review of Systems: Unable to perform due to patient factors  Lab Results  Component Value Date   WBC 6.5 07/27/2016   HGB 10.0 (L) 07/27/2016   HCT 31.2 (L) 07/27/2016   MCV 84.6 07/27/2016   PLT 231 07/27/2016    Lab Results  Component Value Date   CREATININE 0.85 07/27/2016   BUN 27 (H) 07/27/2016   NA 143 07/27/2016   K 3.6 07/27/2016   CL 118 (H) 07/27/2016   CO2 17 (L) 07/27/2016    Lab Results  Component Value Date   ALT 19 07/24/2016   AST 23 07/24/2016   ALKPHOS 139 (H) 07/24/2016     Microbiology: Recent Results (from the past 240 hour(s))  Culture, blood (Routine x 2)     Status: Abnormal   Collection Time: 07/18/16 10:55 PM  Result Value Ref Range Status   Specimen Description BLOOD RIGHT ARM  Final   Special Requests IN PEDIATRIC BOTTLE Blood Culture adequate volume  Final   Culture  Setup Time   Final    GRAM POSITIVE COCCI IN CLUSTERS IN PEDIATRIC BOTTLE CRITICAL RESULT CALLED TO, READ BACK BY AND VERIFIED WITH: K HAMMONS,PHARMD AT 1619 07/19/16 BY L BENFIELD    Culture (A)  Final    STAPHYLOCOCCUS AUREUS SUSCEPTIBILITIES PERFORMED ON PREVIOUS CULTURE WITHIN THE LAST 5 DAYS.    Report Status 07/21/2016 FINAL  Final  Blood Culture ID Panel (Reflexed)     Status: Abnormal   Collection Time: 07/18/16 10:55 PM  Result Value Ref Range Status   Enterococcus species NOT DETECTED NOT DETECTED Final   Listeria  monocytogenes NOT DETECTED NOT DETECTED Final   Staphylococcus species DETECTED (A) NOT DETECTED Final    Comment: CRITICAL RESULT CALLED TO, READ BACK BY AND VERIFIED WITH: K HAMMONS, PHARMD AT 1619 07/19/16 BY L BENFIELD    Staphylococcus aureus DETECTED (A) NOT DETECTED Final    Comment: Methicillin (oxacillin) susceptible Staphylococcus aureus (MSSA). Preferred therapy is anti staphylococcal beta lactam antibiotic (Cefazolin or Nafcillin), unless clinically contraindicated. CRITICAL RESULT CALLED TO, READ BACK BY AND VERIFIED WITH: K HAMMONS,PHARMD AT 1619 07/19/16 BY L BENFIELD    Methicillin resistance NOT DETECTED NOT DETECTED Final   Streptococcus species NOT DETECTED NOT DETECTED Final   Streptococcus agalactiae NOT DETECTED NOT DETECTED Final   Streptococcus pneumoniae NOT DETECTED NOT DETECTED Final   Streptococcus pyogenes NOT DETECTED NOT DETECTED Final   Acinetobacter baumannii NOT DETECTED NOT DETECTED Final   Enterobacteriaceae species NOT DETECTED NOT DETECTED Final   Enterobacter cloacae complex NOT DETECTED NOT DETECTED Final   Escherichia coli NOT DETECTED NOT DETECTED Final   Klebsiella oxytoca NOT DETECTED NOT DETECTED Final   Klebsiella pneumoniae NOT DETECTED NOT DETECTED Final   Proteus species NOT DETECTED NOT DETECTED Final   Serratia marcescens NOT DETECTED NOT DETECTED Final   Carbapenem resistance NOT DETECTED NOT DETECTED Final   Haemophilus influenzae NOT DETECTED NOT  DETECTED Final   Neisseria meningitidis NOT DETECTED NOT DETECTED Final   Pseudomonas aeruginosa DETECTED (A) NOT DETECTED Final    Comment: CRITICAL RESULT CALLED TO, READ BACK BY AND VERIFIED WITH: K HAMMONS,PHARMD AT 1619 07/19/16 BY L BENFIELD    Candida albicans NOT DETECTED NOT DETECTED Final   Candida glabrata NOT DETECTED NOT DETECTED Final   Candida krusei NOT DETECTED NOT DETECTED Final   Candida parapsilosis NOT DETECTED NOT DETECTED Final   Candida tropicalis NOT DETECTED NOT  DETECTED Final  Culture, blood (Routine x 2)     Status: Abnormal   Collection Time: 07/18/16 11:45 PM  Result Value Ref Range Status   Specimen Description BLOOD LEFT ARM  Final   Special Requests   Final    BOTTLES DRAWN AEROBIC AND ANAEROBIC Blood Culture adequate volume   Culture  Setup Time   Final    GRAM POSITIVE COCCI IN CLUSTERS IN BOTH AEROBIC AND ANAEROBIC BOTTLES CRITICAL VALUE NOTED.  VALUE IS CONSISTENT WITH PREVIOUSLY REPORTED AND CALLED VALUE.    Culture STAPHYLOCOCCUS AUREUS PSEUDOMONAS AERUGINOSA  (A)  Final   Report Status 07/21/2016 FINAL  Final   Organism ID, Bacteria STAPHYLOCOCCUS AUREUS  Final   Organism ID, Bacteria PSEUDOMONAS AERUGINOSA  Final      Susceptibility   Pseudomonas aeruginosa - MIC*    CEFTAZIDIME 2 SENSITIVE Sensitive     CIPROFLOXACIN <=0.25 SENSITIVE Sensitive     GENTAMICIN <=1 SENSITIVE Sensitive     IMIPENEM 1 SENSITIVE Sensitive     PIP/TAZO 8 SENSITIVE Sensitive     CEFEPIME 2 SENSITIVE Sensitive     * PSEUDOMONAS AERUGINOSA   Staphylococcus aureus - MIC*    CIPROFLOXACIN <=0.5 SENSITIVE Sensitive     ERYTHROMYCIN 1 INTERMEDIATE Intermediate     GENTAMICIN <=0.5 SENSITIVE Sensitive     OXACILLIN <=0.25 SENSITIVE Sensitive     TETRACYCLINE <=1 SENSITIVE Sensitive     VANCOMYCIN 1 SENSITIVE Sensitive     TRIMETH/SULFA <=10 SENSITIVE Sensitive     CLINDAMYCIN RESISTANT Resistant     RIFAMPIN <=0.5 SENSITIVE Sensitive     Inducible Clindamycin POSITIVE Resistant     * STAPHYLOCOCCUS AUREUS  CSF culture     Status: None   Collection Time: 07/19/16  4:07 AM  Result Value Ref Range Status   Specimen Description CSF  Final   Special Requests NONE  Final   Gram Stain   Final    CYTOSPIN SMEAR WBC PRESENT, PREDOMINANTLY PMN NO ORGANISMS SEEN    Culture NO GROWTH 3 DAYS  Final   Report Status 07/22/2016 FINAL  Final  MRSA PCR Screening     Status: None   Collection Time: 07/19/16  5:26 AM  Result Value Ref Range Status   MRSA by  PCR NEGATIVE NEGATIVE Final    Comment:        The GeneXpert MRSA Assay (FDA approved for NASAL specimens only), is one component of a comprehensive MRSA colonization surveillance program. It is not intended to diagnose MRSA infection nor to guide or monitor treatment for MRSA infections.   Respiratory Panel by PCR     Status: None   Collection Time: 07/19/16 12:19 PM  Result Value Ref Range Status   Adenovirus NOT DETECTED NOT DETECTED Final   Coronavirus 229E NOT DETECTED NOT DETECTED Final   Coronavirus HKU1 NOT DETECTED NOT DETECTED Final   Coronavirus NL63 NOT DETECTED NOT DETECTED Final   Coronavirus OC43 NOT DETECTED NOT DETECTED Final   Metapneumovirus  NOT DETECTED NOT DETECTED Final   Rhinovirus / Enterovirus NOT DETECTED NOT DETECTED Final   Influenza A NOT DETECTED NOT DETECTED Final   Influenza B NOT DETECTED NOT DETECTED Final   Parainfluenza Virus 1 NOT DETECTED NOT DETECTED Final   Parainfluenza Virus 2 NOT DETECTED NOT DETECTED Final   Parainfluenza Virus 3 NOT DETECTED NOT DETECTED Final   Parainfluenza Virus 4 NOT DETECTED NOT DETECTED Final   Respiratory Syncytial Virus NOT DETECTED NOT DETECTED Final   Bordetella pertussis NOT DETECTED NOT DETECTED Final   Chlamydophila pneumoniae NOT DETECTED NOT DETECTED Final   Mycoplasma pneumoniae NOT DETECTED NOT DETECTED Final  Culture, Urine     Status: Abnormal   Collection Time: 07/19/16 10:38 PM  Result Value Ref Range Status   Specimen Description URINE, CLEAN CATCH  Final   Special Requests NONE  Final   Culture MULTIPLE SPECIES PRESENT, SUGGEST RECOLLECTION (A)  Final   Report Status 07/21/2016 FINAL  Final  Culture, blood (routine x 2)     Status: None   Collection Time: 07/20/16  7:02 AM  Result Value Ref Range Status   Specimen Description BLOOD LEFT HAND  Final   Special Requests   Final    BOTTLES DRAWN AEROBIC ONLY Blood Culture adequate volume   Culture NO GROWTH 5 DAYS  Final   Report Status  07/25/2016 FINAL  Final  Culture, blood (routine x 2)     Status: Abnormal   Collection Time: 07/20/16  7:03 AM  Result Value Ref Range Status   Specimen Description BLOOD RIGHT HAND  Final   Special Requests IN PEDIATRIC BOTTLE Blood Culture adequate volume  Final   Culture  Setup Time   Final    GRAM POSITIVE COCCI IN CLUSTERS IN PEDIATRIC BOTTLE CRITICAL RESULT CALLED TO, READ BACK BY AND VERIFIED WITH: TO JBATCHELDER(PHARMd) BY TCLEVELAND 07/21/2016 AT 4:57AM    Culture (A)  Final    STAPHYLOCOCCUS AUREUS SUSCEPTIBILITIES PERFORMED ON PREVIOUS CULTURE WITHIN THE LAST 5 DAYS.    Report Status 07/23/2016 FINAL  Final  Blood Culture ID Panel (Reflexed)     Status: Abnormal   Collection Time: 07/20/16  7:03 AM  Result Value Ref Range Status   Enterococcus species NOT DETECTED NOT DETECTED Final   Listeria monocytogenes NOT DETECTED NOT DETECTED Final   Staphylococcus species DETECTED (A) NOT DETECTED Final    Comment: CRITICAL RESULT CALLED TO, READ BACK BY AND VERIFIED WITH: TO JBATCHELDER(PHARMd) BY TCLEVELAND 07/21/2016 AT 4:53AM    Staphylococcus aureus DETECTED (A) NOT DETECTED Final    Comment: Methicillin (oxacillin) susceptible Staphylococcus aureus (MSSA). Preferred therapy is anti staphylococcal beta lactam antibiotic (Cefazolin or Nafcillin), unless clinically contraindicated. CRITICAL RESULT CALLED TO, READ BACK BY AND VERIFIED WITH: TO JBATCHELDER(PHARMd) BY [facility] 07/21/2016 AT 4:53AM    Methicillin resistance NOT DETECTED NOT DETECTED Final   Streptococcus species NOT DETECTED NOT DETECTED Final   Streptococcus agalactiae NOT DETECTED NOT DETECTED Final   Streptococcus pneumoniae NOT DETECTED NOT DETECTED Final   Streptococcus pyogenes NOT DETECTED NOT DETECTED Final   Acinetobacter baumannii NOT DETECTED NOT DETECTED Final   Enterobacteriaceae species NOT DETECTED NOT DETECTED Final   Enterobacter cloacae complex NOT DETECTED NOT DETECTED Final   Escherichia coli  NOT DETECTED NOT DETECTED Final   Klebsiella oxytoca NOT DETECTED NOT DETECTED Final   Klebsiella pneumoniae NOT DETECTED NOT DETECTED Final   Proteus species NOT DETECTED NOT DETECTED Final   Serratia marcescens NOT DETECTED NOT DETECTED Final  Haemophilus influenzae NOT DETECTED NOT DETECTED Final   Neisseria meningitidis NOT DETECTED NOT DETECTED Final   Pseudomonas aeruginosa NOT DETECTED NOT DETECTED Final   Candida albicans NOT DETECTED NOT DETECTED Final   Candida glabrata NOT DETECTED NOT DETECTED Final   Candida krusei NOT DETECTED NOT DETECTED Final   Candida parapsilosis NOT DETECTED NOT DETECTED Final   Candida tropicalis NOT DETECTED NOT DETECTED Final  Culture, blood (single)     Status: None   Collection Time: 07/21/16  1:44 PM  Result Value Ref Range Status   Specimen Description BLOOD BLOOD RIGHT HAND  Final   Special Requests   Final    BOTTLES DRAWN AEROBIC AND ANAEROBIC Blood Culture adequate volume   Culture NO GROWTH 5 DAYS  Final   Report Status 07/26/2016 FINAL  Final  MRSA PCR Screening     Status: None   Collection Time: 07/23/16 10:30 AM  Result Value Ref Range Status   MRSA by PCR NEGATIVE NEGATIVE Final    Comment:        The GeneXpert MRSA Assay (FDA approved for NASAL specimens only), is one component of a comprehensive MRSA colonization surveillance program. It is not intended to diagnose MRSA infection nor to guide or monitor treatment for MRSA infections.   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  On cefepime alone for both.  Repeat blood culture from 7/15 ngtd.   Continue cefepime No TEE indicated from ID standpoint  2.  Medication monitoring -  creat remains stable, wbc wnl.  Monitoring  I will folllow up again on Monday

## 2016-07-27 NOTE — Progress Notes (Signed)
[name]. PULMONARY / CRITICAL CARE MEDICINE CONSULT   Name: Johnathan MaduroChristopher Caponi MRN: [id number] DOB: 1970/12/09    ADMISSION DATE:  07/19/2016 CONSULTATION DATE:  07/23/2016  REFERRING MD:  Catha GosselinMikhail  CHIEF COMPLAINT:  Need for Precedex to manage withdrawal from ETOH/ IVD,  HISTORY OF PRESENT ILLNESS:   [name] a 46 y.o.male,w Hep C?, IVDA, (recently injected opana) apparently c/o fever intermittently for  1 week, and redness over the dorsum of the right foot and slight swelling. Pt notes injecting Opana about 2 days ago and cellulitis in right foot noted on admission 7/13 with labs showing EKG and abnormal LFTs with hyponatremia 123 and right lower lobe infiltrate on chest x-ray  Found to have MSSA bacteremia, possible meningitis, and LE cellulitis.  Echocardiogram Showed EF 55-60%, grade 1 diastolic dysfunction, tricuspid valve with mobile vegetation. On 7/17 He developed acute encephalopathy with agitation, becoming belligerent and combative. He denies ETOH abuse, but admitted to IVDA. He was placed on CIWA protocol, however, scores remained 19-23, patient was not responsive  to ativan, transferred to ICU for precedex gtt    SUBJECTIVE:  Precedex off since 7am. Last dose ativan 4am. He remains somewhat sedate. Not cooperative with exam this AM. Unclear if because he is unable or unwilling.   VITAL SIGNS: BP (!) 145/88   Pulse 83   Temp 97.9 F (36.6 C) (Oral)   Resp (!) 30   Ht 5\' 11"  (1.803 m)   Wt 72.8 kg (160 lb 7.9 oz)   SpO2 100%   BMI 22.38 kg/m   HEMODYNAMICS:    VENTILATOR SETTINGS:    INTAKE / OUTPUT: I/O last 3 completed shifts: In: 2769.2 [I.V.:2649.2; Other:20; IV Piggyback:100] Out: 950 [Urine:950]  PHYSICAL EXAMINATION: General:  Ill appearing middle aged male in no apparent distress Neuro:  Sedated, agitation improved last 24 hours per staff. Awakes to verbal, but not much verbal response.  HEENT:  Normocephalic, atraumatic. No  JVD Cardiovascular:  Tachy S1, S2, No MRG Lungs:  Tachypneic this AM, somewhat paradoxical.  Abdomen:  Soft and flat, BS +, non-distended Musculoskeletal:  No obvious deformities notes, muscle wasting. Moving all 4 extremities.  Skin:  Dry and intact, Right foot cellulitis, pulses good with brisk refill  LABS:  BMET  Recent Labs Lab 07/25/16 0237 07/26/16 0211 07/27/16 0256  NA 137 139 143  K 3.7 3.8 3.6  CL 109 115* 118*  CO2 21* 18* 17*  BUN 26* 27* 27*  CREATININE 0.96 0.88 0.85  GLUCOSE 155* 130* 87    Electrolytes  Recent Labs Lab 07/23/16 1129  07/25/16 0237 07/26/16 0211 07/27/16 0256  CALCIUM  --   < > 7.7* 7.7* 8.0*  MG 1.9  --  2.1  --  2.0  PHOS 2.8  --  3.9  --  4.6  < > = values in this interval not displayed.  CBC  Recent Labs Lab 07/25/16 0237 07/26/16 0211 07/27/16 0256  WBC 8.0 7.7 6.5  HGB 10.2* 10.5* 10.0*  HCT 30.4* 32.1* 31.2*  PLT 176 191 231    Coag's No results for input(s): APTT, INR in the last 168 hours.  Sepsis Markers  Recent Labs Lab 07/23/16 1129  LATICACIDVEN 0.9  PROCALCITON 0.79    ABG No results for input(s): PHART, PCO2ART, PO2ART in the last 168 hours.  Liver Enzymes  Recent Labs Lab 07/22/16 0144 07/23/16 0751 07/24/16 0250  AST 26 29 23   ALT 28 21 19   ALKPHOS 203* 161* 139*  BILITOT 5.6*  2.0* 2.1*  ALBUMIN 1.7* 1.4* 1.4*    Cardiac Enzymes No results for input(s): TROPONINI, PROBNP in the last 168 hours.  Glucose  Recent Labs Lab 07/26/16 0707 07/26/16 1146 07/26/16 1552 07/26/16 1958 07/27/16 0402 07/27/16 0840  GLUCAP 137* 103* 91 93 75 98    Imaging Dg Chest Port 1 View  Result Date: 07/27/2016 CLINICAL DATA:  Acute respiratory failure EXAM: PORTABLE CHEST 1 VIEW COMPARISON:  07/25/2016 FINDINGS: Haziness of the right chest from pleural effusion. Interstitial coarsening above prior baseline. There are underlying COPD changes. Nodular density on the left was not seen 06/05/2016  and is likely acute rather than chronic nodule. Asymmetric right apical pleural thickening, chronic. Chronic borderline cardiomegaly. No pneumothorax. IMPRESSION: 1. Layering right pleural effusion obscuring the right lower lung. 2. Probable pulmonary edema. 3. Emphysema. Electronically Signed   By: Marnee Spring M.D.   On: 07/27/2016 07:14     STUDIES:  LP 7/13 >> High WBC and protein>> suspect meningitis but Cx no growth Echo > EF 55-60%, grade 1 diastolic dysfunction, tricuspid valve with mobile vegetation>> Needs TEE but refusing CT Head 7/16>> Progressive atrophy, negative for edema to suggest septic emboli Renal ultrasound 7/13>> showed mild right pelvicaliectasis with ureteral jet noted and urinary bladder excluding significant ureteral obstruction  CULTURES: CSF 7/13 >> No growth Blood cultures >> 07/18/16 MSSA  (2/2), Pseudomonas aeruginosa (however gram stain showed no GN) RVP 7/13>> Unremarkable Repeat Blood cultures >>  07/20/16: 1/2 MSSA  ANTIBIOTICS: Vanc 7/13>> 7/13 Zosyn 7/13>> 7/13 Nafcillin 7/13>> 7/16 Cefepime 7/16>>   SIGNIFICANT EVENTS: 7/17>> Transfer to ICU for precedex infusion 7/21 Precedex off.    LINES/TUBES: PIV  DISCUSSION: Pt. With MSSA + pseudomonas bacteremia,withdrawing from ETOH/IV drugs. Tricuspid vegetation. On Cefepime. He was transferred to ICU for Precedex infusion   ASSESSMENT / PLAN:  PULMONARY A: Right lower lobe pneumonia/ septic emboli versus aspiration pneumonia with cough -[place] favors septic emboli R sided effusion P: Pulmonary hygiene as able Consider CT scan/Thoracentesis if becomes febrile. Mobilize when able, plan for OOB to chair today.  F/u CXR  Abx as above   CARDIOVASCULAR A:  Sepsis secondary to MSSA bacteremia- TV endocarditis EF>> 55-60% grade 1 diastolic dysfunction, tricuspid valve with mobile vegetation HTN P: Telemetry monitoring Keep SBP < No need for TEE with vegetation identified on  TTE Clonidine patch  RENAL A:   Acute renal failure Creatinine upon admission 3.17, improved Hypokalemia Hyponatremia Worsening NAG acidosis > hypercholermic  P:   Trend BMET Replete Electrolytes as needed IVF to half normal   GASTROINTESTINAL A:   Protein calorie malnutrition-mild P:   BJY:NWGNFA LFTs in AM Now off precedex, will try some clears today.  HEMATOLOGIC A:   Anemia P:  Trend CBC Transfuse for HGB <7 DVT prophylaxis SQ heparin  INFECTIOUS A:   ? UTI>>Rare bacteria, 6-30 WBC, negative leukocytes and negative nitrites MSSA bacteremia endocarditis/possible meningitis/pneumonia/ R foot cellulitis  Pseudomonas bacteremia P:   Trend fever/ WBC Curve Appreciate ID input -ct cefepime  ENDOCRINE A:   Abnormal TSH>>TSH 0.208, FT4 2.51 P:   CBG Q 4 SSI Repeat thyroid testing 3-4 weeks  NEUROLOGIC A:   Acute encephalopathy with agitation Withdrawal/ polysubstance abuse P:   DC precedex Ativan per CIWA Suboxone SL Neuro checks   FAMILY  - Updates: No family at bedside 7/19  - Inter-disciplinary family meet or Palliative Care meeting due by:  7/25 .   Joneen Roach, AGACNP-BC [facility] Pulmonology/Critical Care Pager [phone] or [phone]  07/27/2016 11:31 AM

## 2016-07-27 NOTE — Progress Notes (Signed)
eLink Physician-Brief Progress Note Patient Name: Johnathan MaduroChristopher [name] DOB: 06/08/1970 MRN: [id number]   Date of Service  07/27/2016  HPI/Events of Note  Agitation  eICU Interventions  Will order: 1. Ativan 2 mg IV X 1 now.      Intervention Category Minor Interventions: Agitation / anxiety - evaluation and management  Lenell AntuSommer,Steven Eugene 07/27/2016, 6:47 PM

## 2016-07-27 NOTE — Progress Notes (Signed)
LCSW continues to follow and consulted for SA counseling/discharge planning.  Patient remains in the ICU, still unable at this time to complete accurate assessment.  Will continue to follow acutely and address needs as they arise along with SA assessment and resources if patient open and agreeable.  Deretha EmoryHannah Hermenia Fritcher LCSW, MSW Clinical Social Work: Optician, dispensingystem [employer] Coverage for :  [phone]

## 2016-07-28 ENCOUNTER — Inpatient Hospital Stay (HOSPITAL_COMMUNITY): Payer: Medicaid Other

## 2016-07-28 LAB — CBC
HCT: 27.8 % — ABNORMAL LOW (ref 39.0–52.0)
Hemoglobin: 9.1 g/dL — ABNORMAL LOW (ref 13.0–17.0)
MCH: 27.4 pg (ref 26.0–34.0)
MCHC: 32.7 g/dL (ref 30.0–36.0)
MCV: 83.7 fL (ref 78.0–100.0)
PLATELETS: 248 10*3/uL (ref 150–400)
RBC: 3.32 MIL/uL — ABNORMAL LOW (ref 4.22–5.81)
RDW: 16.3 % — AB (ref 11.5–15.5)
WBC: 5.7 10*3/uL (ref 4.0–10.5)

## 2016-07-28 LAB — URINALYSIS, ROUTINE W REFLEX MICROSCOPIC
Bacteria, UA: NONE SEEN
Glucose, UA: 50 mg/dL — AB
KETONES UR: 5 mg/dL — AB
Leukocytes, UA: NEGATIVE
Nitrite: NEGATIVE
Protein, ur: >=300 mg/dL — AB
SQUAMOUS EPITHELIAL / LPF: NONE SEEN
Specific Gravity, Urine: 1.034 — ABNORMAL HIGH (ref 1.005–1.030)
pH: 5 (ref 5.0–8.0)

## 2016-07-28 LAB — BASIC METABOLIC PANEL
Anion gap: 6 (ref 5–15)
BUN: 26 mg/dL — AB (ref 6–20)
CHLORIDE: 118 mmol/L — AB (ref 101–111)
CO2: 19 mmol/L — ABNORMAL LOW (ref 22–32)
CREATININE: 0.92 mg/dL (ref 0.61–1.24)
Calcium: 8.1 mg/dL — ABNORMAL LOW (ref 8.9–10.3)
GFR calc Af Amer: >60 mL/min (ref >60)
GFR calc non Af Amer: >60 mL/min (ref >60)
Glucose, Bld: 86 mg/dL (ref 65–99)
Potassium: 4.1 mmol/L (ref 3.5–5.1)
SODIUM: 143 mmol/L (ref 135–145)

## 2016-07-28 LAB — HEPATIC FUNCTION PANEL
ALT: 16 U/L — ABNORMAL LOW (ref 17–63)
AST: 22 U/L (ref 15–41)
Albumin: 1.4 g/dL — ABNORMAL LOW (ref 3.5–5.0)
Alkaline Phosphatase: 107 U/L (ref 38–126)
BILIRUBIN DIRECT: 0.4 mg/dL (ref 0.1–0.5)
BILIRUBIN INDIRECT: 1 mg/dL — AB (ref 0.3–0.9)
Total Bilirubin: 1.4 mg/dL — ABNORMAL HIGH (ref 0.3–1.2)
Total Protein: 6.6 g/dL (ref 6.5–8.1)

## 2016-07-28 LAB — GLUCOSE, CAPILLARY
GLUCOSE-CAPILLARY: 73 mg/dL (ref 65–99)
GLUCOSE-CAPILLARY: 87 mg/dL (ref 65–99)
GLUCOSE-CAPILLARY: 91 mg/dL (ref 65–99)
Glucose-Capillary: 69 mg/dL (ref 65–99)

## 2016-07-28 MED ORDER — METOPROLOL TARTRATE 5 MG/5ML IV SOLN
5.0000 mg | Freq: Four times a day (QID) | INTRAVENOUS | Status: DC
  Administered 2016-07-28 – 2016-07-29 (×4): 5 mg via INTRAVENOUS
  Filled 2016-07-28 (×5): qty 5

## 2016-07-28 MED ORDER — HALOPERIDOL LACTATE 5 MG/ML IJ SOLN
2.0000 mg | Freq: Four times a day (QID) | INTRAMUSCULAR | Status: DC | PRN
  Administered 2016-07-30 – 2016-08-06 (×5): 2 mg via INTRAVENOUS
  Filled 2016-07-28 (×9): qty 1

## 2016-07-28 MED ORDER — KCL IN DEXTROSE-NACL 20-5-0.2 MEQ/L-%-% IV SOLN
INTRAVENOUS | Status: DC
  Administered 2016-07-28: 16:00:00 via INTRAVENOUS
  Administered 2016-07-29: 60 mL/h via INTRAVENOUS
  Filled 2016-07-28 (×2): qty 1000

## 2016-07-28 MED ORDER — FENTANYL CITRATE (PF) 100 MCG/2ML IJ SOLN
25.0000 ug | INTRAMUSCULAR | Status: DC | PRN
  Administered 2016-08-04 (×2): 25 ug via INTRAVENOUS
  Filled 2016-07-28 (×3): qty 2

## 2016-07-28 NOTE — Progress Notes (Addendum)
Johnathan [name] TEAM 1 - Stepdown/ICU TEAM  Johnathan [name]  UEA:[id number] DOB: Apr 18, 1970 DOA: 07/19/2016 PCP: Karle Plumber, MD    Brief Narrative:  46yo male w/ Hep C and IVDA (recently injected opana) who presented w/ fever intermittently over 1 week w/ redness and swelling over the dorsum of the right foot. At the time of his admission he was found to have abnormal LFTs, hyponatremia at 123, and a right lower lobe infiltrate on chest x-ray.  Since his admission he has been found to have MSSA bacteremia, possible meningitis, and LE cellulitis.  Echocardiogram Showed EF 55-60%, grade 1 diastolic dysfunction, and tricuspid valve with a mobile vegetation. On 7/17 He developed acute encephalopathy with agitation, becoming belligerent and combative. He was placed on CIWA protocol, however patient was not responsive to ativan and required transfer to the ICU for a precedex gtt.  Significant Events: 7/13 admit 7/17 transfer to ICU - precedex gtt 7/21 precedex off 7/22 TRH resumed care   Subjective: The pt is very lethargic.  He will awaken to a sternal rub, but very quickly goes back to sleep.  His RN reports he has been this way all day.  He does not appear to be in any acute resp distress.    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  Cont abx tx as per ID guidance - CSF culture negative   Pseudomonas bacteremia  Cont abx tx as per ID guidance  Acute encephalopathy Cause of current lethargy not clear - attempt to minimize sedatives - support w/ IVF to avoid hypoglycemia   Acute renal failure  crt 3.17 on admission - crt has normalized   Illicit drug abuse   Hepatitis C  DVT prophylaxis: SQ heparin  Code Status: FULL CODE Family Communication: no family present at time of exam  Disposition Plan: SDU  Consultants:   ID PCCM  Antimicrobials:  Vanc 7/13 > 7/13 Zosyn 7/13 > 7/13 Nafcillin 7/13 > 7/16 Cefepime 7/16 >   Objective: Blood pressure (!) 145/94, pulse 75, temperature 98.7 F (37.1 C), temperature source Oral, resp. rate 13, height 5\' 11"  (1.803 m), weight 72.8 kg (160 lb 7.9 oz), SpO2 96 %.  Intake/Output Summary (Last 24 hours) at 07/28/16 1413 Last data filed at 07/28/16 1400  Gross per 24 hour  Intake             1250 ml  Output              647 ml  Net              603 ml   Filed Weights   07/25/16 0500 07/26/16 0500 07/27/16 0419  Weight: 72.1 kg (158 lb 15.2 oz) 73.1 kg (161 lb 2.5 oz) 72.8 kg (160 lb 7.9 oz)    Examination: General: No acute respiratory distress Lungs: Clear to auscultation bilaterally without wheezes or crackles Cardiovascular: Regular rate and rhythm without murmur gallop or rub normal S1 and S2 Abdomen: Nontender, nondistended, soft, bowel sounds positive, no rebound, no ascites, no appreciable mass Extremities: No significant cyanosis, clubbing, or edema bilateral lower extremities  CBC:  Recent Labs Lab 07/24/16 0250 07/25/16 0237 07/26/16 0211 07/27/16 0256 07/28/16 0428  WBC 7.5 8.0 7.7 6.5 5.7  HGB 10.0* 10.2* 10.5* 10.0* 9.1*  HCT 29.1* 30.4* 32.1* 31.2* 27.8*  MCV 82.7  82.6 83.8 84.6 83.7  PLT 151 176 191 231 248   Basic Metabolic Panel:  Recent Labs Lab 07/21/16 1435 07/22/16 0144  07/23/16 1129 07/24/16 0250 07/25/16 0237 07/26/16 0211 07/27/16 0256 07/28/16 0428  NA  --  132*  < >  --  134* 137 139 143 143  K  --  3.6  < >  --  3.8 3.7 3.8 3.6 4.1  CL  --  100*  < >  --  107 109 115* 118* 118*  CO2  --  26  < >  --  18* 21* 18* 17* 19*  GLUCOSE  --  106*  < >  --  97 155* 130* 87 86  BUN  --  16  < >  --  21* 26* 27* 27* 26*  CREATININE  --  1.01  < > 0.85 1.08 0.96 0.88 0.85 0.92  CALCIUM  --  7.1*  < >  --  7.5* 7.7* 7.7* 8.0* 8.1*  MG 1.8 1.9  --  1.9  --  2.1  --  2.0  --   PHOS  --   --   --  2.8  --  3.9  --   4.6  --   < > = values in this interval not displayed. GFR: Estimated Creatinine Clearance: 104.4 mL/min (by C-G formula based on SCr of 0.92 mg/dL).  Liver Function Tests:  Recent Labs Lab 07/22/16 0144 07/23/16 0751 07/24/16 0250 07/28/16 0428  AST 26 29 23 22   ALT 28 21 19  16*  ALKPHOS 203* 161* 139* 107  BILITOT 5.6* 2.0* 2.1* 1.4*  PROT 6.8 5.8* 6.0* 6.6  ALBUMIN 1.7* 1.4* 1.4* 1.4*    CBG:  Recent Labs Lab 07/27/16 1607 07/27/16 2001 07/28/16 0034 07/28/16 0810 07/28/16 1315  GLUCAP 97 106* 87 73 69    Recent Results (from the past 240 hour(s))  Culture, blood (Routine x 2)     Status: Abnormal   Collection Time: 07/18/16 10:55 PM  Result Value Ref Range Status   Specimen Description BLOOD RIGHT ARM  Final   Special Requests IN PEDIATRIC BOTTLE Blood Culture adequate volume  Final   Culture  Setup Time   Final    GRAM POSITIVE COCCI IN CLUSTERS IN PEDIATRIC BOTTLE CRITICAL RESULT CALLED TO, READ BACK BY AND VERIFIED WITH: K HAMMONS,PHARMD AT 1619 07/19/16 BY L BENFIELD    Culture (A)  Final    STAPHYLOCOCCUS AUREUS SUSCEPTIBILITIES PERFORMED ON PREVIOUS CULTURE WITHIN THE LAST 5 DAYS.    Report Status 07/21/2016 FINAL  Final  Blood Culture ID Panel (Reflexed)     Status: Abnormal   Collection Time: 07/18/16 10:55 PM  Result Value Ref Range Status   Enterococcus species NOT DETECTED NOT DETECTED Final   Listeria monocytogenes NOT DETECTED NOT DETECTED Final   Staphylococcus species DETECTED (A) NOT DETECTED Final    Comment: CRITICAL RESULT CALLED TO, READ BACK BY AND VERIFIED WITH: K HAMMONS, PHARMD AT 1619 07/19/16 BY L BENFIELD    Staphylococcus aureus DETECTED (A) NOT DETECTED Final    Comment: Methicillin (oxacillin) susceptible Staphylococcus aureus (MSSA). Preferred therapy is anti staphylococcal beta lactam antibiotic (Cefazolin or Nafcillin), unless clinically contraindicated. CRITICAL RESULT CALLED TO, READ BACK BY AND VERIFIED WITH: K  HAMMONS,PHARMD AT 1619 07/19/16 BY L BENFIELD    Methicillin resistance NOT DETECTED NOT DETECTED Final   Streptococcus species NOT DETECTED NOT DETECTED Final   Streptococcus agalactiae NOT DETECTED NOT DETECTED Final  Streptococcus pneumoniae NOT DETECTED NOT DETECTED Final   Streptococcus pyogenes NOT DETECTED NOT DETECTED Final   Acinetobacter baumannii NOT DETECTED NOT DETECTED Final   Enterobacteriaceae species NOT DETECTED NOT DETECTED Final   Enterobacter cloacae complex NOT DETECTED NOT DETECTED Final   Escherichia coli NOT DETECTED NOT DETECTED Final   Klebsiella oxytoca NOT DETECTED NOT DETECTED Final   Klebsiella pneumoniae NOT DETECTED NOT DETECTED Final   Proteus species NOT DETECTED NOT DETECTED Final   Serratia marcescens NOT DETECTED NOT DETECTED Final   Carbapenem resistance NOT DETECTED NOT DETECTED Final   Haemophilus influenzae NOT DETECTED NOT DETECTED Final   Neisseria meningitidis NOT DETECTED NOT DETECTED Final   Pseudomonas aeruginosa DETECTED (A) NOT DETECTED Final    Comment: CRITICAL RESULT CALLED TO, READ BACK BY AND VERIFIED WITH: K HAMMONS,PHARMD AT 1619 07/19/16 BY L BENFIELD    Candida albicans NOT DETECTED NOT DETECTED Final   Candida glabrata NOT DETECTED NOT DETECTED Final   Candida krusei NOT DETECTED NOT DETECTED Final   Candida parapsilosis NOT DETECTED NOT DETECTED Final   Candida tropicalis NOT DETECTED NOT DETECTED Final  Culture, blood (Routine x 2)     Status: Abnormal   Collection Time: 07/18/16 11:45 PM  Result Value Ref Range Status   Specimen Description BLOOD LEFT ARM  Final   Special Requests   Final    BOTTLES DRAWN AEROBIC AND ANAEROBIC Blood Culture adequate volume   Culture  Setup Time   Final    GRAM POSITIVE COCCI IN CLUSTERS IN BOTH AEROBIC AND ANAEROBIC BOTTLES CRITICAL VALUE NOTED.  VALUE IS CONSISTENT WITH PREVIOUSLY REPORTED AND CALLED VALUE.    Culture STAPHYLOCOCCUS AUREUS PSEUDOMONAS AERUGINOSA  (A)  Final    Report Status 07/21/2016 FINAL  Final   Organism ID, Bacteria STAPHYLOCOCCUS AUREUS  Final   Organism ID, Bacteria PSEUDOMONAS AERUGINOSA  Final      Susceptibility   Pseudomonas aeruginosa - MIC*    CEFTAZIDIME 2 SENSITIVE Sensitive     CIPROFLOXACIN <=0.25 SENSITIVE Sensitive     GENTAMICIN <=1 SENSITIVE Sensitive     IMIPENEM 1 SENSITIVE Sensitive     PIP/TAZO 8 SENSITIVE Sensitive     CEFEPIME 2 SENSITIVE Sensitive     * PSEUDOMONAS AERUGINOSA   Staphylococcus aureus - MIC*    CIPROFLOXACIN <=0.5 SENSITIVE Sensitive     ERYTHROMYCIN 1 INTERMEDIATE Intermediate     GENTAMICIN <=0.5 SENSITIVE Sensitive     OXACILLIN <=0.25 SENSITIVE Sensitive     TETRACYCLINE <=1 SENSITIVE Sensitive     VANCOMYCIN 1 SENSITIVE Sensitive     TRIMETH/SULFA <=10 SENSITIVE Sensitive     CLINDAMYCIN RESISTANT Resistant     RIFAMPIN <=0.5 SENSITIVE Sensitive     Inducible Clindamycin POSITIVE Resistant     * STAPHYLOCOCCUS AUREUS  CSF culture     Status: None   Collection Time: 07/19/16  4:07 AM  Result Value Ref Range Status   Specimen Description CSF  Final   Special Requests NONE  Final   Gram Stain   Final    CYTOSPIN SMEAR WBC PRESENT, PREDOMINANTLY PMN NO ORGANISMS SEEN    Culture NO GROWTH 3 DAYS  Final   Report Status 07/22/2016 FINAL  Final  MRSA PCR Screening     Status: None   Collection Time: 07/19/16  5:26 AM  Result Value Ref Range Status   MRSA by PCR NEGATIVE NEGATIVE Final    Comment:        The GeneXpert MRSA Assay (FDA  approved for NASAL specimens only), is one component of a comprehensive MRSA colonization surveillance program. It is not intended to diagnose MRSA infection nor to guide or monitor treatment for MRSA infections.   Respiratory Panel by PCR     Status: None   Collection Time: 07/19/16 12:19 PM  Result Value Ref Range Status   Adenovirus NOT DETECTED NOT DETECTED Final   Coronavirus 229E NOT DETECTED NOT DETECTED Final   Coronavirus HKU1 NOT DETECTED  NOT DETECTED Final   Coronavirus NL63 NOT DETECTED NOT DETECTED Final   Coronavirus OC43 NOT DETECTED NOT DETECTED Final   Metapneumovirus NOT DETECTED NOT DETECTED Final   Rhinovirus / Enterovirus NOT DETECTED NOT DETECTED Final   Influenza A NOT DETECTED NOT DETECTED Final   Influenza B NOT DETECTED NOT DETECTED Final   Parainfluenza Virus 1 NOT DETECTED NOT DETECTED Final   Parainfluenza Virus 2 NOT DETECTED NOT DETECTED Final   Parainfluenza Virus 3 NOT DETECTED NOT DETECTED Final   Parainfluenza Virus 4 NOT DETECTED NOT DETECTED Final   Respiratory Syncytial Virus NOT DETECTED NOT DETECTED Final   Bordetella pertussis NOT DETECTED NOT DETECTED Final   Chlamydophila pneumoniae NOT DETECTED NOT DETECTED Final   Mycoplasma pneumoniae NOT DETECTED NOT DETECTED Final  Culture, Urine     Status: Abnormal   Collection Time: 07/19/16 10:38 PM  Result Value Ref Range Status   Specimen Description URINE, CLEAN CATCH  Final   Special Requests NONE  Final   Culture MULTIPLE SPECIES PRESENT, SUGGEST RECOLLECTION (A)  Final   Report Status 07/21/2016 FINAL  Final  Culture, blood (routine x 2)     Status: None   Collection Time: 07/20/16  7:02 AM  Result Value Ref Range Status   Specimen Description BLOOD LEFT HAND  Final   Special Requests   Final    BOTTLES DRAWN AEROBIC ONLY Blood Culture adequate volume   Culture NO GROWTH 5 DAYS  Final   Report Status 07/25/2016 FINAL  Final  Culture, blood (routine x 2)     Status: Abnormal   Collection Time: 07/20/16  7:03 AM  Result Value Ref Range Status   Specimen Description BLOOD RIGHT HAND  Final   Special Requests IN PEDIATRIC BOTTLE Blood Culture adequate volume  Final   Culture  Setup Time   Final    GRAM POSITIVE COCCI IN CLUSTERS IN PEDIATRIC BOTTLE CRITICAL RESULT CALLED TO, READ BACK BY AND VERIFIED WITH: TO JBATCHELDER(PHARMd) BY TCLEVELAND 07/21/2016 AT 4:57AM    Culture (A)  Final    STAPHYLOCOCCUS AUREUS SUSCEPTIBILITIES  PERFORMED ON PREVIOUS CULTURE WITHIN THE LAST 5 DAYS.    Report Status 07/23/2016 FINAL  Final  Blood Culture ID Panel (Reflexed)     Status: Abnormal   Collection Time: 07/20/16  7:03 AM  Result Value Ref Range Status   Enterococcus species NOT DETECTED NOT DETECTED Final   Listeria monocytogenes NOT DETECTED NOT DETECTED Final   Staphylococcus species DETECTED (A) NOT DETECTED Final    Comment: CRITICAL RESULT CALLED TO, READ BACK BY AND VERIFIED WITH: TO JBATCHELDER(PHARMd) BY TCLEVELAND 07/21/2016 AT 4:53AM    Staphylococcus aureus DETECTED (A) NOT DETECTED Final    Comment: Methicillin (oxacillin) susceptible Staphylococcus aureus (MSSA). Preferred therapy is anti staphylococcal beta lactam antibiotic (Cefazolin or Nafcillin), unless clinically contraindicated. CRITICAL RESULT CALLED TO, READ BACK BY AND VERIFIED WITH: TO JBATCHELDER(PHARMd) BY TCLEVELAND 07/21/2016 AT 4:53AM    Methicillin resistance NOT DETECTED NOT DETECTED Final   Streptococcus species NOT DETECTED  NOT DETECTED Final   Streptococcus agalactiae NOT DETECTED NOT DETECTED Final   Streptococcus pneumoniae NOT DETECTED NOT DETECTED Final   Streptococcus pyogenes NOT DETECTED NOT DETECTED Final   Acinetobacter baumannii NOT DETECTED NOT DETECTED Final   Enterobacteriaceae species NOT DETECTED NOT DETECTED Final   Enterobacter cloacae complex NOT DETECTED NOT DETECTED Final   Escherichia coli NOT DETECTED NOT DETECTED Final   Klebsiella oxytoca NOT DETECTED NOT DETECTED Final   Klebsiella pneumoniae NOT DETECTED NOT DETECTED Final   Proteus species NOT DETECTED NOT DETECTED Final   Serratia marcescens NOT DETECTED NOT DETECTED Final   Haemophilus influenzae NOT DETECTED NOT DETECTED Final   Neisseria meningitidis NOT DETECTED NOT DETECTED Final   Pseudomonas aeruginosa NOT DETECTED NOT DETECTED Final   Candida albicans NOT DETECTED NOT DETECTED Final   Candida glabrata NOT DETECTED NOT DETECTED Final   Candida  krusei NOT DETECTED NOT DETECTED Final   Candida parapsilosis NOT DETECTED NOT DETECTED Final   Candida tropicalis NOT DETECTED NOT DETECTED Final  Culture, blood (single)     Status: None   Collection Time: 07/21/16  1:44 PM  Result Value Ref Range Status   Specimen Description BLOOD BLOOD RIGHT HAND  Final   Special Requests   Final    BOTTLES DRAWN AEROBIC AND ANAEROBIC Blood Culture adequate volume   Culture NO GROWTH 5 DAYS  Final   Report Status 07/26/2016 FINAL  Final  MRSA PCR Screening     Status: None   Collection Time: 07/23/16 10:30 AM  Result Value Ref Range Status   MRSA by PCR NEGATIVE NEGATIVE Final    Comment:        The GeneXpert MRSA Assay (FDA approved for NASAL specimens only), is one component of a comprehensive MRSA colonization surveillance program. It is not intended to diagnose MRSA infection nor to guide or monitor treatment for MRSA infections.   Urine culture     Status: None   Collection Time: 07/23/16  4:41 PM  Result Value Ref Range Status   Specimen Description URINE, CATHETERIZED  Final   Special Requests NONE  Final   Culture NO GROWTH  Final   Report Status 07/24/2016 FINAL  Final     Scheduled Meds: . buprenorphine-naloxone  1 tablet Sublingual Daily  . chlorhexidine  15 mL Mouth Rinse BID  . cloNIDine  0.2 mg Transdermal Weekly  . feeding supplement (PRO-STAT SUGAR FREE 64)  30 mL Oral BID  . folic acid  1 mg Intravenous Daily  . heparin  5,000 Units Subcutaneous Q8H  . mouth rinse  15 mL Mouth Rinse q12n4p  . multivitamin with minerals  1 tablet Oral Daily  . sodium chloride flush  3 mL Intravenous Q12H  . thiamine  100 mg Intravenous Daily     LOS: 9 days   Lonia Blood, MD [facility] Office  [phone] Pager - Text Page per Amion as per below:  On-Call/Text Page:      [email or web address]      password [email or web address]  If 7PM-7AM, please contact night-coverage [email or web address] Password [facility] 07/28/2016, 2:13 PM

## 2016-07-29 ENCOUNTER — Inpatient Hospital Stay (HOSPITAL_COMMUNITY): Payer: Medicaid Other

## 2016-07-29 DIAGNOSIS — F1910 Other psychoactive substance abuse, uncomplicated: Secondary | ICD-10-CM

## 2016-07-29 LAB — GLUCOSE, CAPILLARY
GLUCOSE-CAPILLARY: 85 mg/dL (ref 65–99)
GLUCOSE-CAPILLARY: 83 mg/dL (ref 65–99)
Glucose-Capillary: 83 mg/dL (ref 65–99)
Glucose-Capillary: 89 mg/dL (ref 65–99)
Glucose-Capillary: 94 mg/dL (ref 65–99)
Glucose-Capillary: 98 mg/dL (ref 65–99)

## 2016-07-29 MED ORDER — VITAMIN B-1 100 MG PO TABS: 100.0000 mg | ORAL_TABLET | Freq: Every day | ORAL | Status: DC

## 2016-07-29 MED ORDER — METOPROLOL TARTRATE 25 MG PO TABS
25.0000 mg | ORAL_TABLET | Freq: Two times a day (BID) | ORAL | Status: DC
  Filled 2016-07-29 (×2): qty 1

## 2016-07-29 MED ORDER — METOPROLOL TARTRATE 25 MG PO TABS: 25.0000 mg | ORAL_TABLET | Freq: Two times a day (BID) | ORAL | Status: DC

## 2016-07-29 MED ORDER — METOPROLOL TARTRATE 25 MG PO TABS
25.0000 mg | ORAL_TABLET | Freq: Two times a day (BID) | ORAL | Status: DC
  Filled 2016-07-29: qty 1

## 2016-07-29 MED ORDER — FOLIC ACID 1 MG PO TABS: 1.0000 mg | ORAL_TABLET | Freq: Every day | ORAL | Status: DC

## 2016-07-29 MED ORDER — BOOST / RESOURCE BREEZE PO LIQD
1.0000 | Freq: Three times a day (TID) | ORAL | Status: DC
  Administered 2016-07-29 – 2016-08-08 (×8): 1 via ORAL

## 2016-07-29 NOTE — Progress Notes (Signed)
[facility] TEAM 1 - Stepdown/ICU TEAM  Johnathan [name]  ZOX:[id number] DOB: [birth year] DOA: 07/19/2016 PCP: Karle Plumber, MD    Brief Narrative:  46yo male w/ Hep C and IVDA (recently injected opana) who presented w/ fever intermittently over 1 week w/ redness and swelling over the dorsum of the right foot. At the time of his admission he was found to have abnormal LFTs, hyponatremia at 123, and a right lower lobe infiltrate on chest x-ray.  Since his admission he has been found to have MSSA bacteremia, possible meningitis, and LE cellulitis.  TTE showed EF 55-60%, grade 1 diastolic dysfunction, and tricuspid valve with a mobile vegetation. On 7/17 he developed acute encephalopathy with agitation, becoming belligerent and combative. He was placed on CIWA protocol, however patient was not responsive to ativan and required transfer to the ICU for a precedex gtt.  Significant Events: 7/13 admit 7/17 transfer to ICU - precedex gtt 7/21 precedex off 7/22 TRH resumed care   Subjective: The patient is much more alert today though he remains confused and unable to provide a reliable history.  He is at times agitated per his are in.  He has been tolerating his clear liquid diet without any significant difficulty.  He does not appear to be in uncontrolled pain and there is no evidence of respiratory distress.   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  Cont abx tx as per ID guidance - CSF culture negative - will need prolonged course of abx, and can't be sent home on IV abx due to his hx of injection drug abuse - will need SNF v/s prolonged hospital stay   Pseudomonas bacteremia  Cont abx tx as per ID guidance  Acute encephalopathy Slowly improving over the last 24hrs - haldol as needed for agitation   Acute renal failure  crt 3.17 on  admission - crt has normalized   Illicit drug abuse   Hepatitis C Will need outpt f/u to determine if he is a candidate for tx AFTER he proves he is in recovery from illicit drug abuse   DVT prophylaxis: SQ heparin  Code Status: FULL CODE Family Communication: no family present at time of exam  Disposition Plan: stable for transfer to a tele bed - PT/OT - cont IV abx - begin to search for placement options   Consultants:  ID PCCM  Antimicrobials:  Vanc 7/13 > 7/13 Zosyn 7/13 > 7/13 Nafcillin 7/13 > 7/16 Cefepime 7/16 >   Objective: Blood pressure (!) 174/108, pulse 81, temperature 98.2 F (36.8 C), temperature source Oral, resp. rate 18, height 5\' 11"  (1.803 m), weight 74.3 kg (163 lb 12.8 oz), SpO2 99 %.  Intake/Output Summary (Last 24 hours) at 07/29/16 0945 Last data filed at 07/29/16 0900  Gross per 24 hour  Intake             1146 ml  Output              347 ml  Net              799 ml   Filed Weights   07/27/16 0419 07/28/16 1600 07/29/16 0500  Weight: 72.8 kg (160 lb 7.9 oz) 74.5 kg (164 lb 3.9 oz) 74.3 kg (163 lb 12.8 oz)    Examination: General: No acute respiratory distress - more  alert  Lungs: CTA B - no wheezing  Cardiovascular: RRR w/o gallup or rub  Abdomen: Nontender, nondistended, soft, bowel sounds positive, no rebound, no ascites, no appreciable mass Extremities: No edema bilateral lower extremities  CBC:  Recent Labs Lab 07/24/16 0250 07/25/16 0237 07/26/16 0211 07/27/16 0256 07/28/16 0428  WBC 7.5 8.0 7.7 6.5 5.7  HGB 10.0* 10.2* 10.5* 10.0* 9.1*  HCT 29.1* 30.4* 32.1* 31.2* 27.8*  MCV 82.7 82.6 83.8 84.6 83.7  PLT 151 176 191 231 248   Basic Metabolic Panel:  Recent Labs Lab 07/23/16 1129 07/24/16 0250 07/25/16 0237 07/26/16 0211 07/27/16 0256 07/28/16 0428  NA  --  134* 137 139 143 143  K  --  3.8 3.7 3.8 3.6 4.1  CL  --  107 109 115* 118* 118*  CO2  --  18* 21* 18* 17* 19*  GLUCOSE  --  97 155* 130* 87 86  BUN  --  21*  26* 27* 27* 26*  CREATININE 0.85 1.08 0.96 0.88 0.85 0.92  CALCIUM  --  7.5* 7.7* 7.7* 8.0* 8.1*  MG 1.9  --  2.1  --  2.0  --   PHOS 2.8  --  3.9  --  4.6  --    GFR: Estimated Creatinine Clearance: 106.6 mL/min (by C-G formula based on SCr of 0.92 mg/dL).  Liver Function Tests:  Recent Labs Lab 07/23/16 0751 07/24/16 0250 07/28/16 0428  AST 29 23 22   ALT 21 19 16*  ALKPHOS 161* 139* 107  BILITOT 2.0* 2.1* 1.4*  PROT 5.8* 6.0* 6.6  ALBUMIN 1.4* 1.4* 1.4*    CBG:  Recent Labs Lab 07/28/16 1621 07/28/16 1951 07/29/16 0016 07/29/16 0420 07/29/16 0727  GLUCAP 85 91 98 94 83    Recent Results (from the past 240 hour(s))  Respiratory Panel by PCR     Status: None   Collection Time: 07/19/16 12:19 PM  Result Value Ref Range Status   Adenovirus NOT DETECTED NOT DETECTED Final   Coronavirus 229E NOT DETECTED NOT DETECTED Final   Coronavirus HKU1 NOT DETECTED NOT DETECTED Final   Coronavirus NL63 NOT DETECTED NOT DETECTED Final   Coronavirus OC43 NOT DETECTED NOT DETECTED Final   Metapneumovirus NOT DETECTED NOT DETECTED Final   Rhinovirus / Enterovirus NOT DETECTED NOT DETECTED Final   Influenza A NOT DETECTED NOT DETECTED Final   Influenza B NOT DETECTED NOT DETECTED Final   Parainfluenza Virus 1 NOT DETECTED NOT DETECTED Final   Parainfluenza Virus 2 NOT DETECTED NOT DETECTED Final   Parainfluenza Virus 3 NOT DETECTED NOT DETECTED Final   Parainfluenza Virus 4 NOT DETECTED NOT DETECTED Final   Respiratory Syncytial Virus NOT DETECTED NOT DETECTED Final   Bordetella pertussis NOT DETECTED NOT DETECTED Final   Chlamydophila pneumoniae NOT DETECTED NOT DETECTED Final   Mycoplasma pneumoniae NOT DETECTED NOT DETECTED Final  Culture, Urine     Status: Abnormal   Collection Time: 07/19/16 10:38 PM  Result Value Ref Range Status   Specimen Description URINE, CLEAN CATCH  Final   Special Requests NONE  Final   Culture MULTIPLE SPECIES PRESENT, SUGGEST RECOLLECTION (A)   Final   Report Status 07/21/2016 FINAL  Final  Culture, blood (routine x 2)     Status: None   Collection Time: 07/20/16  7:02 AM  Result Value Ref Range Status   Specimen Description BLOOD LEFT HAND  Final   Special Requests   Final    BOTTLES DRAWN AEROBIC ONLY Blood  Culture adequate volume   Culture NO GROWTH 5 DAYS  Final   Report Status 07/25/2016 FINAL  Final  Culture, blood (routine x 2)     Status: Abnormal   Collection Time: 07/20/16  7:03 AM  Result Value Ref Range Status   Specimen Description BLOOD RIGHT HAND  Final   Special Requests IN PEDIATRIC BOTTLE Blood Culture adequate volume  Final   Culture  Setup Time   Final    GRAM POSITIVE COCCI IN CLUSTERS IN PEDIATRIC BOTTLE CRITICAL RESULT CALLED TO, READ BACK BY AND VERIFIED WITH: TO JBATCHELDER(PHARMd) BY TCLEVELAND 07/21/2016 AT 4:57AM    Culture (A)  Final    STAPHYLOCOCCUS AUREUS SUSCEPTIBILITIES PERFORMED ON PREVIOUS CULTURE WITHIN THE LAST 5 DAYS.    Report Status 07/23/2016 FINAL  Final  Blood Culture ID Panel (Reflexed)     Status: Abnormal   Collection Time: 07/20/16  7:03 AM  Result Value Ref Range Status   Enterococcus species NOT DETECTED NOT DETECTED Final   Listeria monocytogenes NOT DETECTED NOT DETECTED Final   Staphylococcus species DETECTED (A) NOT DETECTED Final    Comment: CRITICAL RESULT CALLED TO, READ BACK BY AND VERIFIED WITH: TO JBATCHELDER(PHARMd) BY TCLEVELAND 07/21/2016 AT 4:53AM    Staphylococcus aureus DETECTED (A) NOT DETECTED Final    Comment: Methicillin (oxacillin) susceptible Staphylococcus aureus (MSSA). Preferred therapy is anti staphylococcal beta lactam antibiotic (Cefazolin or Nafcillin), unless clinically contraindicated. CRITICAL RESULT CALLED TO, READ BACK BY AND VERIFIED WITH: TO JBATCHELDER(PHARMd) BY [facility] 07/21/2016 AT 4:53AM    Methicillin resistance NOT DETECTED NOT DETECTED Final   Streptococcus species NOT DETECTED NOT DETECTED Final   Streptococcus agalactiae  NOT DETECTED NOT DETECTED Final   Streptococcus pneumoniae NOT DETECTED NOT DETECTED Final   Streptococcus pyogenes NOT DETECTED NOT DETECTED Final   Acinetobacter baumannii NOT DETECTED NOT DETECTED Final   Enterobacteriaceae species NOT DETECTED NOT DETECTED Final   Enterobacter cloacae complex NOT DETECTED NOT DETECTED Final   Escherichia coli NOT DETECTED NOT DETECTED Final   Klebsiella oxytoca NOT DETECTED NOT DETECTED Final   Klebsiella pneumoniae NOT DETECTED NOT DETECTED Final   Proteus species NOT DETECTED NOT DETECTED Final   Serratia marcescens NOT DETECTED NOT DETECTED Final   Haemophilus influenzae NOT DETECTED NOT DETECTED Final   Neisseria meningitidis NOT DETECTED NOT DETECTED Final   Pseudomonas aeruginosa NOT DETECTED NOT DETECTED Final   Candida albicans NOT DETECTED NOT DETECTED Final   Candida glabrata NOT DETECTED NOT DETECTED Final   Candida krusei NOT DETECTED NOT DETECTED Final   Candida parapsilosis NOT DETECTED NOT DETECTED Final   Candida tropicalis NOT DETECTED NOT DETECTED Final  Culture, blood (single)     Status: None   Collection Time: 07/21/16  1:44 PM  Result Value Ref Range Status   Specimen Description BLOOD BLOOD RIGHT HAND  Final   Special Requests   Final    BOTTLES DRAWN AEROBIC AND ANAEROBIC Blood Culture adequate volume   Culture NO GROWTH 5 DAYS  Final   Report Status 07/26/2016 FINAL  Final  MRSA PCR Screening     Status: None   Collection Time: 07/23/16 10:30 AM  Result Value Ref Range Status   MRSA by PCR NEGATIVE NEGATIVE Final    Comment:        The GeneXpert MRSA Assay (FDA approved for NASAL specimens only), is one component of a comprehensive MRSA colonization surveillance program. It is not intended to diagnose MRSA infection nor to guide or monitor treatment for  MRSA infections.   Urine culture     Status: None   Collection Time: 07/23/16  4:41 PM  Result Value Ref Range Status   Specimen Description URINE,  CATHETERIZED  Final   Special Requests NONE  Final   Culture NO GROWTH  Final   Report Status 07/24/2016 FINAL  Final     Scheduled Meds: . buprenorphine-naloxone  1 tablet Sublingual Daily  . chlorhexidine  15 mL Mouth Rinse BID  . cloNIDine  0.2 mg Transdermal Weekly  . feeding supplement (PRO-STAT SUGAR FREE 64)  30 mL Oral BID  . folic acid  1 mg Intravenous Daily  . heparin  5,000 Units Subcutaneous Q8H  . mouth rinse  15 mL Mouth Rinse q12n4p  . metoprolol tartrate  5 mg Intravenous Q6H  . multivitamin with minerals  1 tablet Oral Daily  . sodium chloride flush  3 mL Intravenous Q12H  . thiamine  100 mg Intravenous Daily     LOS: 10 days   Lonia BloodJeffrey T. Johnathan Bebeau, MD [facility] Office  [phone] Pager - Text Page per Loretha StaplerAmion as per below:  On-Call/Text Page:      [email or web address]      password [email or web address]  If 7PM-7AM, please contact night-coverage [email or web address] Password [email or web address] 07/29/2016, 9:45 AM

## 2016-07-29 NOTE — Progress Notes (Signed)
Nutrition Follow-up  DOCUMENTATION CODES:   Non-severe (moderate) malnutrition in context of chronic illness  INTERVENTION:    Boost Breeze po TID, each supplement provides 250 kcal and 9 grams of protein  NUTRITION DIAGNOSIS:   Malnutrition (Moderate) related to chronic illness (polysubstance abuse) as evidenced by moderate depletion of body fat, moderate depletions of muscle mass, moderate to severe fluid accumulation.  Ongoing  GOAL:   Patient will meet greater than or equal to 90% of their needs  Unmet  MONITOR:   Diet advancement, PO intake, Supplement acceptance, Labs, I & O's  ASSESSMENT:   46 yo male with PMH of substance abuse, tobacco use, IV drug abuse, who was admitted on 7/13 with MSSA bacteremia, endocarditis, PNA, cellulitis of RLE.  Patient required transfer to the ICU for Precedex due to withdrawal with acute encephalopathy. He has been very lethargic with poor oral intake. Currently on clear liquids.  Labs reviewed. Medications reviewed and include folic acid, MVI, and thiamine.  Diet Order:  Diet clear liquid Room service appropriate? Yes; Fluid consistency: Thin  Skin:  Reviewed, no issues  Last BM:  7/22  Height:   Ht Readings from Last 1 Encounters:  07/19/16 5\' 11"  (1.803 m)    Weight:   Wt Readings from Last 1 Encounters:  07/29/16 163 lb 12.8 oz (74.3 kg)   07/22/16 156 lb (70.8 kg)   I/O + 15 L since admission  Ideal Body Weight:  78.2 kg  BMI:  Body mass index is 22.85 kg/m.  Estimated Nutritional Needs:   Kcal:  2100-2300  Protein:  100-115 gm  Fluid:  2.1 L  EDUCATION NEEDS:   No education needs identified at this time  Joaquin CourtsKimberly Harris, RD, LDN, CNSC Pager [phone] After Hours Pager [phone]

## 2016-07-29 NOTE — Progress Notes (Signed)
Patient transferred to [phone]. Report given to oncoming RN. When transferring patient from ICU bed to new bed in room patient stood up with two person assist and started to loose balance when pivoting. Patients left foot became lodged behind right side ICU bed rail when we assisted him to the new bed and prevented him from falling. There is a small open abrasion on patients left ankle. However, patient is complaining of pain in right foot near a skin tear unrelated to this incident. Of note, patient was noted to have scattered abrasions on left foot with foot swelling prior to this incident. MD paged. Patient stable and comfortable in bed at this time. Will follow up with MD for further orders.

## 2016-07-29 NOTE — Progress Notes (Signed)
MD contacted this RN and new orders received in regards to patient incident while transferring beds.

## 2016-07-29 NOTE — Progress Notes (Signed)
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  Interval History: repeat blood culture 7/15 remained negative, no complaints, refused blood draw this am; no fever, WBC yesterday wnl.   No associated n/v/d.  No rashes.   Physical Exam: Constitutional:  Vitals:   07/29/16 0729 07/29/16 0759  BP:  (!) 153/80  Pulse:  69  Resp:  18  Temp: 98.8 F (37.1 C) 98.2 F (36.8 C)   patient appears in NAD Respiratory: Normal respiratory effort; CTA B Cardiovascular: RRR GI: soft, nt, nd  Review of Systems: Constitutional: negative for fevers and chills Gastrointestinal: negative for diarrhea  Lab Results  Component Value Date   WBC 5.7 07/28/2016   HGB 9.1 (L) 07/28/2016   HCT 27.8 (L) 07/28/2016   MCV 83.7 07/28/2016   PLT 248 07/28/2016    Lab Results  Component Value Date   CREATININE 0.92 07/28/2016   BUN 26 (H) 07/28/2016   NA 143 07/28/2016   K 4.1 07/28/2016   CL 118 (H) 07/28/2016   CO2 19 (L) 07/28/2016    Lab Results  Component Value Date   ALT 16 (L) 07/28/2016   AST 22 07/28/2016   ALKPHOS 107 07/28/2016     Microbiology: Recent Results (from the past 240 hour(s))  Respiratory Panel by PCR     Status: None   Collection Time: 07/19/16 12:19 PM  Result Value Ref Range Status   Adenovirus NOT DETECTED NOT DETECTED Final   Coronavirus 229E NOT DETECTED NOT DETECTED Final   Coronavirus HKU1 NOT DETECTED NOT DETECTED Final   Coronavirus NL63 NOT DETECTED NOT DETECTED Final   Coronavirus OC43 NOT DETECTED NOT DETECTED Final   Metapneumovirus NOT DETECTED NOT DETECTED Final   Rhinovirus / Enterovirus NOT DETECTED NOT DETECTED Final   Influenza A NOT DETECTED NOT DETECTED Final   Influenza B NOT DETECTED NOT DETECTED Final   Parainfluenza Virus 1 NOT DETECTED NOT DETECTED Final   Parainfluenza Virus 2 NOT DETECTED NOT DETECTED Final   Parainfluenza Virus 3 NOT DETECTED NOT DETECTED Final   Parainfluenza Virus 4 NOT  DETECTED NOT DETECTED Final   Respiratory Syncytial Virus NOT DETECTED NOT DETECTED Final   Bordetella pertussis NOT DETECTED NOT DETECTED Final   Chlamydophila pneumoniae NOT DETECTED NOT DETECTED Final   Mycoplasma pneumoniae NOT DETECTED NOT DETECTED Final  Culture, Urine     Status: Abnormal   Collection Time: 07/19/16 10:38 PM  Result Value Ref Range Status   Specimen Description URINE, CLEAN CATCH  Final   Special Requests NONE  Final   Culture MULTIPLE SPECIES PRESENT, SUGGEST RECOLLECTION (A)  Final   Report Status 07/21/2016 FINAL  Final  Culture, blood (routine x 2)     Status: None   Collection Time: 07/20/16  7:02 AM  Result Value Ref Range Status   Specimen Description BLOOD LEFT HAND  Final   Special Requests   Final    BOTTLES DRAWN AEROBIC ONLY Blood Culture adequate volume   Culture NO GROWTH 5 DAYS  Final   Report Status 07/25/2016 FINAL  Final  Culture, blood (routine x 2)     Status: Abnormal   Collection Time: 07/20/16  7:03 AM  Result Value Ref Range Status   Specimen Description BLOOD RIGHT HAND  Final   Special Requests IN PEDIATRIC BOTTLE Blood Culture adequate volume  Final   Culture  Setup Time   Final    GRAM POSITIVE COCCI IN  CLUSTERS IN PEDIATRIC BOTTLE CRITICAL RESULT CALLED TO, READ BACK BY AND VERIFIED WITH: TO JBATCHELDER(PHARMd) BY [facility] 07/21/2016 AT 4:57AM    Culture (A)  Final    STAPHYLOCOCCUS AUREUS SUSCEPTIBILITIES PERFORMED ON PREVIOUS CULTURE WITHIN THE LAST 5 DAYS.    Report Status 07/23/2016 FINAL  Final  Blood Culture ID Panel (Reflexed)     Status: Abnormal   Collection Time: 07/20/16  7:03 AM  Result Value Ref Range Status   Enterococcus species NOT DETECTED NOT DETECTED Final   Listeria monocytogenes NOT DETECTED NOT DETECTED Final   Staphylococcus species DETECTED (A) NOT DETECTED Final    Comment: CRITICAL RESULT CALLED TO, READ BACK BY AND VERIFIED WITH: TO JBATCHELDER(PHARMd) BY TCLEVELAND 07/21/2016 AT 4:53AM     Staphylococcus aureus DETECTED (A) NOT DETECTED Final    Comment: Methicillin (oxacillin) susceptible Staphylococcus aureus (MSSA). Preferred therapy is anti staphylococcal beta lactam antibiotic (Cefazolin or Nafcillin), unless clinically contraindicated. CRITICAL RESULT CALLED TO, READ BACK BY AND VERIFIED WITH: TO JBATCHELDER(PHARMd) BY [facility] 07/21/2016 AT 4:53AM    Methicillin resistance NOT DETECTED NOT DETECTED Final   Streptococcus species NOT DETECTED NOT DETECTED Final   Streptococcus agalactiae NOT DETECTED NOT DETECTED Final   Streptococcus pneumoniae NOT DETECTED NOT DETECTED Final   Streptococcus pyogenes NOT DETECTED NOT DETECTED Final   Acinetobacter baumannii NOT DETECTED NOT DETECTED Final   Enterobacteriaceae species NOT DETECTED NOT DETECTED Final   Enterobacter cloacae complex NOT DETECTED NOT DETECTED Final   Escherichia coli NOT DETECTED NOT DETECTED Final   Klebsiella oxytoca NOT DETECTED NOT DETECTED Final   Klebsiella pneumoniae NOT DETECTED NOT DETECTED Final   Proteus species NOT DETECTED NOT DETECTED Final   Serratia marcescens NOT DETECTED NOT DETECTED Final   Haemophilus influenzae NOT DETECTED NOT DETECTED Final   Neisseria meningitidis NOT DETECTED NOT DETECTED Final   Pseudomonas aeruginosa NOT DETECTED NOT DETECTED Final   Candida albicans NOT DETECTED NOT DETECTED Final   Candida glabrata NOT DETECTED NOT DETECTED Final   Candida krusei NOT DETECTED NOT DETECTED Final   Candida parapsilosis NOT DETECTED NOT DETECTED Final   Candida tropicalis NOT DETECTED NOT DETECTED Final  Culture, blood (single)     Status: None   Collection Time: 07/21/16  1:44 PM  Result Value Ref Range Status   Specimen Description BLOOD BLOOD RIGHT HAND  Final   Special Requests   Final    BOTTLES DRAWN AEROBIC AND ANAEROBIC Blood Culture adequate volume   Culture NO GROWTH 5 DAYS  Final   Report Status 07/26/2016 FINAL  Final  MRSA PCR Screening     Status: None    Collection Time: 07/23/16 10:30 AM  Result Value Ref Range Status   MRSA by PCR NEGATIVE NEGATIVE Final    Comment:        The GeneXpert MRSA Assay (FDA approved for NASAL specimens only), is one component of a comprehensive MRSA colonization surveillance program. It is not intended to diagnose MRSA infection nor to guide or monitor treatment for MRSA infections.   Urine culture     Status: None   Collection Time: 07/23/16  4:41 PM  Result Value Ref Range Status   Specimen Description URINE, CATHETERIZED  Final   Special Requests NONE  Final   Culture NO GROWTH  Final   Report Status 07/24/2016 FINAL  Final    Impression/Plan:  1. TV endocarditis - two positive organisms and remains on cefepime to cover both.  Repeat was negative.  He will need  long-term cefepime through August 25th.   2.  Substance abuse - on suboxone and will need to continue.    3.  Disposition - he will not be a candidate for home IV antibiotics so will need placement or remain here for the duration.    I will continue to follow intermittently.

## 2016-07-29 NOTE — Progress Notes (Signed)
Called and informed Merdis DelayK. Schorr, NP pt refusing lab draw Pt post CIWA, confusion improving. Schorr states [facility] will address pt refusing lab draw in am.

## 2016-07-29 NOTE — Progress Notes (Signed)
Attempted report x1. 

## 2016-07-29 NOTE — Progress Notes (Signed)
Pharmacy Antibiotic Note  Jennet MaduroChristopher Bidinger is a [age] YOM with hx Hep C, IVDA who presented on 7/13 with R-foot redness/swelling with fevers. The patient is noted to have MSSA + Psuedomonas bacteremia with TTE positive for tricuspid valve IE. ID on board. Currently on cefepime 2 g IV q 8 hours.   SCr remains stable, CrC >100 ml/min.   Plan: -Continue cefepime 2gm IV q8h -Pharmacy to sign off since renal fx is stable   Height: 5\' 11"  (180.3 cm) Weight: 163 lb 12.8 oz (74.3 kg) IBW/kg (Calculated) : 75.3  Temp (24hrs), Avg:98.2 F (36.8 C), Min:98 F (36.7 C), Max:98.8 F (37.1 C)   Recent Labs Lab 07/23/16 1129 07/24/16 0250 07/25/16 0237 07/26/16 0211 07/27/16 0256 07/28/16 0428  WBC 7.8 7.5 8.0 7.7 6.5 5.7  CREATININE 0.85 1.08 0.96 0.88 0.85 0.92  LATICACIDVEN 0.9  --   --   --   --   --     Estimated Creatinine Clearance: 106.6 mL/min (by C-G formula based on SCr of 0.92 mg/dL).    No Known Allergies  Antimicrobials this admission: Zosyn 7/13 >> 7/13 Vanc 7/13 >> 7/13 Cefepime 7/13 >>  Nafcillin 7/13 >> 7/16  Microbiology results: 7/12 BCx - MSSA + PSA (pan sensitive) 7/13 CSF cx - negative 7/13 RVP- negative 7/13 MRSA PCR - negative 7/13 UCx - negative 7/14 BCx - 1/2 MSSA 7/15 BCx - neg 7/17 Urine- neg   Thank you for allowing pharmacy to be a part of this patient's care.   Vinnie LevelBenjamin Dawnelle Warman, PharmD., BCPS Clinical Pharmacist Pager [phone]

## 2016-07-30 ENCOUNTER — Inpatient Hospital Stay (HOSPITAL_COMMUNITY): Payer: Medicaid Other

## 2016-07-30 DIAGNOSIS — R609 Edema, unspecified: Secondary | ICD-10-CM

## 2016-07-30 LAB — COMPREHENSIVE METABOLIC PANEL
ALT: 13 U/L — ABNORMAL LOW (ref 17–63)
ANION GAP: 4 — AB (ref 5–15)
AST: 21 U/L (ref 15–41)
Albumin: 1.4 g/dL — ABNORMAL LOW (ref 3.5–5.0)
Alkaline Phosphatase: 98 U/L (ref 38–126)
BUN: 38 mg/dL — ABNORMAL HIGH (ref 6–20)
CALCIUM: 8.5 mg/dL — AB (ref 8.9–10.3)
CHLORIDE: 115 mmol/L — AB (ref 101–111)
CO2: 18 mmol/L — ABNORMAL LOW (ref 22–32)
CREATININE: 1.08 mg/dL (ref 0.61–1.24)
Glucose, Bld: 92 mg/dL (ref 65–99)
Potassium: 4.8 mmol/L (ref 3.5–5.1)
Sodium: 137 mmol/L (ref 135–145)
Total Bilirubin: 1 mg/dL (ref 0.3–1.2)
Total Protein: 6.8 g/dL (ref 6.5–8.1)

## 2016-07-30 LAB — CBC
HCT: 39.3 % (ref 39.0–52.0)
Hemoglobin: 13 g/dL (ref 13.0–17.0)
MCH: 28.6 pg (ref 26.0–34.0)
MCHC: 33.1 g/dL (ref 30.0–36.0)
MCV: 86.6 fL (ref 78.0–100.0)
PLATELETS: 336 10*3/uL (ref 150–400)
RBC: 4.54 MIL/uL (ref 4.22–5.81)
RDW: 18.1 % — ABNORMAL HIGH (ref 11.5–15.5)
WBC: 16.6 10*3/uL — ABNORMAL HIGH (ref 4.0–10.5)

## 2016-07-30 LAB — BLOOD GAS, ARTERIAL
ACID-BASE DEFICIT: 8.6 mmol/L — AB (ref 0.0–2.0)
BICARBONATE: 16.4 mmol/L — AB (ref 20.0–28.0)
Drawn by: 244851
O2 CONTENT: 3 L/min
O2 SAT: 98.4 %
PCO2 ART: 32.8 mmHg (ref 32.0–48.0)
PH ART: 7.319 — AB (ref 7.350–7.450)
PO2 ART: 114 mmHg — AB (ref 83.0–108.0)
Patient temperature: 98.6

## 2016-07-30 LAB — HEPARIN LEVEL (UNFRACTIONATED): HEPARIN UNFRACTIONATED: 0.29 [IU]/mL — AB (ref 0.30–0.70)

## 2016-07-30 LAB — GLUCOSE, CAPILLARY
GLUCOSE-CAPILLARY: 90 mg/dL (ref 65–99)
GLUCOSE-CAPILLARY: 88 mg/dL (ref 65–99)
Glucose-Capillary: 97 mg/dL (ref 65–99)

## 2016-07-30 MED ORDER — VITAMIN B-1 100 MG PO TABS: 100.0000 mg | ORAL_TABLET | Freq: Every day | ORAL | Status: DC

## 2016-07-30 MED ORDER — LORAZEPAM 1 MG PO TABS: 1.0000 mg | ORAL_TABLET | Freq: Four times a day (QID) | ORAL | Status: AC | PRN

## 2016-07-30 MED ORDER — HEPARIN BOLUS VIA INFUSION
2000.0000 [IU] | Freq: Once | INTRAVENOUS | Status: AC
  Administered 2016-07-30: 2000 [IU] via INTRAVENOUS
  Filled 2016-07-30: qty 2000

## 2016-07-30 MED ORDER — ADULT MULTIVITAMIN W/MINERALS CH: 1.0000 | ORAL_TABLET | Freq: Every day | ORAL | Status: DC

## 2016-07-30 MED ORDER — METOPROLOL TARTRATE 5 MG/5ML IV SOLN
5.0000 mg | Freq: Four times a day (QID) | INTRAVENOUS | Status: DC
Start: 2016-07-30 — End: 2016-08-09
  Administered 2016-07-30 – 2016-08-09 (×37): 5 mg via INTRAVENOUS
  Filled 2016-07-30 (×38): qty 5

## 2016-07-30 MED ORDER — LORAZEPAM 2 MG/ML IJ SOLN
0.0000 mg | Freq: Four times a day (QID) | INTRAMUSCULAR | Status: DC
  Administered 2016-07-31 – 2016-08-01 (×4): 1 mg via INTRAVENOUS
  Filled 2016-07-30 (×5): qty 1

## 2016-07-30 MED ORDER — IOPAMIDOL (ISOVUE-370) INJECTION 76%
INTRAVENOUS | Status: AC
  Administered 2016-07-30: 100 mL
  Filled 2016-07-30: qty 100

## 2016-07-30 MED ORDER — LORAZEPAM 2 MG/ML IJ SOLN: 1.0000 mg | Freq: Four times a day (QID) | INTRAMUSCULAR | Status: AC | PRN

## 2016-07-30 MED ORDER — LORAZEPAM 2 MG/ML IJ SOLN
0.0000 mg | Freq: Two times a day (BID) | INTRAMUSCULAR | Status: DC
  Administered 2016-08-01 (×2): 1 mg via INTRAVENOUS
  Filled 2016-07-30 (×2): qty 1

## 2016-07-30 MED ORDER — HEPARIN BOLUS VIA INFUSION
4000.0000 [IU] | Freq: Once | INTRAVENOUS | Status: AC
  Administered 2016-07-30: 4000 [IU] via INTRAVENOUS
  Filled 2016-07-30: qty 4000

## 2016-07-30 MED ORDER — HEPARIN (PORCINE) IN NACL 100-0.45 UNIT/ML-% IJ SOLN
1500.0000 [IU]/h | INTRAMUSCULAR | Status: DC
  Administered 2016-07-30: 1100 [IU]/h via INTRAVENOUS
  Administered 2016-07-31: 1250 [IU]/h via INTRAVENOUS
  Administered 2016-08-01: 1700 [IU]/h via INTRAVENOUS
  Administered 2016-08-01: 1600 [IU]/h via INTRAVENOUS
  Administered 2016-08-02 (×2): 1700 [IU]/h via INTRAVENOUS
  Administered 2016-08-03 – 2016-08-06 (×6): 1750 [IU]/h via INTRAVENOUS
  Administered 2016-08-07: 1600 [IU]/h via INTRAVENOUS
  Administered 2016-08-07: 1750 [IU]/h via INTRAVENOUS
  Administered 2016-08-08 (×2): 1600 [IU]/h via INTRAVENOUS
  Filled 2016-07-30 (×17): qty 250

## 2016-07-30 MED ORDER — FOLIC ACID 1 MG PO TABS
1.0000 mg | ORAL_TABLET | Freq: Every day | ORAL | Status: DC
  Filled 2016-07-30 (×3): qty 1

## 2016-07-30 MED ORDER — THIAMINE HCL 100 MG/ML IJ SOLN: 100.0000 mg | Freq: Every day | INTRAMUSCULAR | Status: DC

## 2016-07-30 NOTE — Progress Notes (Signed)
ANTICOAGULATION CONSULT NOTE - Initial Consult  Pharmacy Consult for heparin Indication: DVT  No Known Allergies  Patient Measurements: Height: 5\' 11"  (180.3 cm) Weight: 169 lb 1.5 oz (76.7 kg) IBW/kg (Calculated) : 75.3 Heparin Dosing Weight: 76.7kg  Vital Signs: Temp: 98 F (36.7 C) (07/24 0900) Temp Source: Oral (07/24 0900) BP: 154/101 (07/24 0900) Pulse Rate: 103 (07/24 0900)  Labs:  Recent Labs  07/28/16 0428  HGB 9.1*  HCT 27.8*  PLT 248  CREATININE 0.92    Estimated Creatinine Clearance: 108 mL/min (by C-G formula based on SCr of 0.92 mg/dL).   Assessment: 45 YOM not on anticoagulation PTA who is being treated for endocarditis. Now with small segment of acute DVT in radial veins of left forearm- to start IV heparin.  Hgb 9.1, plts 248 on 7/22- no bleeding noted. Has been receiving subq heparin- last dose given this morning.  Goal of Therapy:  Heparin level 0.3-0.7 units/ml Monitor platelets by anticoagulation protocol: Yes   Plan:  Heparin bolus with 4000 units IV x1, then start infusion at 1100 units/hr Heparin level in 6h Daily heparin level and CBC  Adjoa Althouse D. Rajan Burgard, PharmD, BCPS Clinical Pharmacist Pager: [phone] Clinical Phone for 07/30/2016 until 3:30pm: [phone] If after 3:30pm, please call main pharmacy at [phone] 07/30/2016 3:38 PM

## 2016-07-30 NOTE — H&P (Signed)
Patient being transferred to [address] room 12, report called and given to Selinda MichaelsAmy Koslien RN. Vital signs stable. Family at bedside. Staff accompanied patient to awaiting floor.

## 2016-07-30 NOTE — Progress Notes (Signed)
PROGRESS NOTE    Johnathan [name]  ZOX:[id number] DOB: 1970-08-03 DOA: 07/19/2016 PCP: Karle Plumber, MD    Brief Narrative:  46yo male w/ Hep C and IVDA (recently injected opana) who presented w/ fever intermittently over 1 week w/ redness and swelling over the dorsum of the right foot. At the time of his admission he was found to have abnormal LFTs, hyponatremia at 123, and a right lower lobe infiltrate on chest x-ray.  Since his admission he has been found to have MSSA bacteremia, possible meningitis, and LE cellulitis. TTE showed EF 55-60%, grade 1 diastolic dysfunction, and tricuspid valve with a mobile vegetation. On 7/17 he developed acute encephalopathy with agitation, becoming belligerent and combative. He was placed on CIWA protocol, however patient was not responsive to ativan and required transfer to the ICU for a precedex gtt.  Assessment & Plan:   Principal Problem:   MSSA bacteremia Active Problems:   Sepsis (HCC)   UTI (urinary tract infection)   ARF (acute renal failure) (HCC)   Hyponatremia   Abnormal liver function   AKI (acute kidney injury) (HCC)   IVDU (intravenous drug user)   Meningitis   Pneumonia   Bacteremia due to Pseudomonas   Endocarditis of tricuspid valve   Bacteremia   Acute encephalopathy   Alcohol withdrawal syndrome with complication (HCC)   Opiate withdrawal (HCC)  RLL cavitary PNA - septic emboli v/s aspiration - w/ R pleural effusion  Respiratory status presently stable. Increased sob and wheezing noted this AM. Continue O2 and wean as pt can tolerate  MSSA bacteremia w/ sepsis - Tricuspid valve endocarditis  Cont abx tx as per ID guidance - CSF culture negative - will need prolonged course of abx, and can't be sent home on IV abx due to his hx of injection drug abuse - will need SNF v/s prolonged hospital stay. Discussed case with SW. D/C planning in progress.  Pseudomonas bacteremia  Cont abx tx as per ID guidance. Stable at  present. No leukocytosis. Afebrile   Acute encephalopathy Noted to be slowly improving over the last 24hrs - haldol is continued for agitation   Acute renal failure  crt 3.17 on admission - Cr now 0.92  Illicit drug abuse  Will need cessation done when more alert and awake  Hepatitis C Will need outpt f/u to determine if he is a candidate for tx AFTER he proves he is in recovery from illicit drug abuse  LFT's stable at this time. Labs reviewed. Will repeat cmp in AM  Acute LUE DVT Will start on heparin gtt and monitor. If signs of significant bleed, then would stop anticoagulation. If patient tolerates anticoagulation, then would consider transition to NOAC if patient tolerate.  DVT prophylaxis: Heparin gtt Code Status: Full Family Communication: Pt in room Disposition Plan: Uncertain at this time  Consultants:   ID  PCCM  Procedures:  7/13 admit 7/17 transfer to ICU - precedex gtt 7/21 precedex off 7/22 TRH resumed care   Antimicrobials: Anti-infectives    Start     Dose/Rate Route Frequency Ordered Stop   07/22/16 1400  ceFEPIme (MAXIPIME) 2 g in dextrose 5 % 50 mL IVPB     2 g 100 mL/hr over 30 Minutes Intravenous Every 8 hours 07/22/16 1122 08/31/16 2359   07/20/16 1500  ceFEPIme (MAXIPIME) 2 g in dextrose 5 % 50 mL IVPB  Status:  Discontinued     2 g 100 mL/hr over 30 Minutes Intravenous Every 12 hours 07/20/16  1410 07/22/16 1122   07/19/16 2300  vancomycin (VANCOCIN) IVPB 750 mg/150 ml premix  Status:  Discontinued     750 mg 150 mL/hr over 60 Minutes Intravenous Every 24 hours 07/19/16 0237 07/19/16 0953   07/19/16 2200  ceFEPIme (MAXIPIME) 2 g in dextrose 5 % 50 mL IVPB  Status:  Discontinued     2 g 100 mL/hr over 30 Minutes Intravenous Every 24 hours 07/19/16 1931 07/20/16 1410   07/19/16 2000  nafcillin 2 g in dextrose 5 % 100 mL IVPB  Status:  Discontinued     2 g 200 mL/hr over 30 Minutes Intravenous Every 4 hours 07/19/16 1915 07/22/16 1130    07/19/16 1915  nafcillin injection 2 g  Status:  Discontinued     2 g Intravenous Every 4 hours 07/19/16 1912 07/19/16 1915   07/19/16 1800  ceFEPIme (MAXIPIME) 2 g in dextrose 5 % 50 mL IVPB  Status:  Discontinued     2 g 100 mL/hr over 30 Minutes Intravenous Every 24 hours 07/19/16 1646 07/19/16 1912   07/19/16 1100  vancomycin (VANCOCIN) 500 mg in sodium chloride 0.9 % 100 mL IVPB  Status:  Discontinued     500 mg 100 mL/hr over 60 Minutes Intravenous Every 12 hours 07/19/16 0953 07/19/16 1645   07/19/16 0800  piperacillin-tazobactam (ZOSYN) IVPB 3.375 g  Status:  Discontinued     3.375 g 12.5 mL/hr over 240 Minutes Intravenous Every 8 hours 07/19/16 0239 07/19/16 1645   07/19/16 0200  cefTRIAXone (ROCEPHIN) 2 g in dextrose 5 % 50 mL IVPB     2 g 100 mL/hr over 30 Minutes Intravenous  Once 07/19/16 0140 07/19/16 0259   07/19/16 0130  cefTRIAXone (ROCEPHIN) 1 g in dextrose 5 % 50 mL IVPB  Status:  Discontinued     1 g 100 mL/hr over 30 Minutes Intravenous  Once 07/19/16 0125 07/19/16 0140   07/19/16 0115  piperacillin-tazobactam (ZOSYN) IVPB 3.375 g     3.375 g 100 mL/hr over 30 Minutes Intravenous  Once 07/19/16 0109 07/19/16 0219   07/19/16 0115  vancomycin (VANCOCIN) IVPB 1000 mg/200 mL premix     1,000 mg 200 mL/hr over 60 Minutes Intravenous  Once 07/19/16 0109 07/19/16 0243       Subjective: No complaints at this time  Objective: Vitals:   07/29/16 1756 07/29/16 1955 07/30/16 0500 07/30/16 0900  BP: (!) 166/123 (!) 152/87 (!) 152/88 (!) 154/101  Pulse: 66 70 90 (!) 103  Resp:  19 18 18   Temp:  98.2 F (36.8 C) 97.9 F (36.6 C) 98 F (36.7 C)  TempSrc:  Oral Oral Oral  SpO2:  96% 100% 98%  Weight:  76.7 kg (169 lb 1.5 oz)    Height:        Intake/Output Summary (Last 24 hours) at 07/30/16 1436 Last data filed at 07/30/16 1000  Gross per 24 hour  Intake              170 ml  Output               10 ml  Net              160 ml   Filed Weights   07/28/16 1600  07/29/16 0500 07/29/16 1955  Weight: 74.5 kg (164 lb 3.9 oz) 74.3 kg (163 lb 12.8 oz) 76.7 kg (169 lb 1.5 oz)    Examination:  General exam: Appears calm and comfortable  Respiratory system: Clear to  auscultation. Respiratory effort normal. Cardiovascular system: S1 & S2 heard, RRR. No JVD, murmurs, rubs, gallops or clicks. No pedal edema. Gastrointestinal system: Abdomen is nondistended, soft and nontender. No organomegaly or masses felt. Normal bowel sounds heard. Central nervous system: Alert and oriented. No focal neurological deficits. Extremities: Symmetric 5 x 5 power. Skin: No rashes, lesions or ulcers Psychiatry: Judgement and insight appear normal. Mood & affect appropriate.   Data Reviewed: I have personally reviewed following labs and imaging studies  CBC:  Recent Labs Lab 07/24/16 0250 07/25/16 0237 07/26/16 0211 07/27/16 0256 07/28/16 0428  WBC 7.5 8.0 7.7 6.5 5.7  HGB 10.0* 10.2* 10.5* 10.0* 9.1*  HCT 29.1* 30.4* 32.1* 31.2* 27.8*  MCV 82.7 82.6 83.8 84.6 83.7  PLT 151 176 191 231 248   Basic Metabolic Panel:  Recent Labs Lab 07/24/16 0250 07/25/16 0237 07/26/16 0211 07/27/16 0256 07/28/16 0428  NA 134* 137 139 143 143  K 3.8 3.7 3.8 3.6 4.1  CL 107 109 115* 118* 118*  CO2 18* 21* 18* 17* 19*  GLUCOSE 97 155* 130* 87 86  BUN 21* 26* 27* 27* 26*  CREATININE 1.08 0.96 0.88 0.85 0.92  CALCIUM 7.5* 7.7* 7.7* 8.0* 8.1*  MG  --  2.1  --  2.0  --   PHOS  --  3.9  --  4.6  --    GFR: Estimated Creatinine Clearance: 108 mL/min (by C-G formula based on SCr of 0.92 mg/dL). Liver Function Tests:  Recent Labs Lab 07/24/16 0250 07/28/16 0428  AST 23 22  ALT 19 16*  ALKPHOS 139* 107  BILITOT 2.1* 1.4*  PROT 6.0* 6.6  ALBUMIN 1.4* 1.4*   No results for input(s): LIPASE, AMYLASE in the last 168 hours. No results for input(s): AMMONIA in the last 168 hours. Coagulation Profile: No results for input(s): INR, PROTIME in the last 168 hours. Cardiac  Enzymes: No results for input(s): CKTOTAL, CKMB, CKMBINDEX, TROPONINI in the last 168 hours. BNP (last 3 results) No results for input(s): PROBNP in the last 8760 hours. HbA1C: No results for input(s): HGBA1C in the last 72 hours. CBG:  Recent Labs Lab 07/29/16 0727 07/29/16 1114 07/29/16 1526 07/30/16 0736 07/30/16 1207  GLUCAP 83 83 89 97 90   Lipid Profile: No results for input(s): CHOL, HDL, LDLCALC, TRIG, CHOLHDL, LDLDIRECT in the last 72 hours. Thyroid Function Tests: No results for input(s): TSH, T4TOTAL, FREET4, T3FREE, THYROIDAB in the last 72 hours. Anemia Panel: No results for input(s): VITAMINB12, FOLATE, FERRITIN, TIBC, IRON, RETICCTPCT in the last 72 hours. Sepsis Labs: No results for input(s): PROCALCITON, LATICACIDVEN in the last 168 hours.  Recent Results (from the past 240 hour(s))  Culture, blood (single)     Status: None   Collection Time: 07/21/16  1:44 PM  Result Value Ref Range Status   Specimen Description BLOOD BLOOD RIGHT HAND  Final   Special Requests   Final    BOTTLES DRAWN AEROBIC AND ANAEROBIC Blood Culture adequate volume   Culture NO GROWTH 5 DAYS  Final   Report Status 07/26/2016 FINAL  Final  MRSA PCR Screening     Status: None   Collection Time: 07/23/16 10:30 AM  Result Value Ref Range Status   MRSA by PCR NEGATIVE NEGATIVE Final    Comment:        The GeneXpert MRSA Assay (FDA approved for NASAL specimens only), is one component of a comprehensive MRSA colonization surveillance program. It is not intended to diagnose MRSA infection  nor to guide or monitor treatment for MRSA infections.   Urine culture     Status: None   Collection Time: 07/23/16  4:41 PM  Result Value Ref Range Status   Specimen Description URINE, CATHETERIZED  Final   Special Requests NONE  Final   Culture NO GROWTH  Final   Report Status 07/24/2016 FINAL  Final     Radiology Studies: Dg Ankle 2 Views Left  Result Date: 07/29/2016 CLINICAL DATA:   Blunt trauma while transferring patient to bed EXAM: LEFT ANKLE - 2 VIEW COMPARISON:  None. FINDINGS: Mild soft tissue swelling is noted laterally. No acute fracture or dislocation is seen. No other focal abnormality is noted. IMPRESSION: Mild soft tissue swelling without acute bony abnormality. Electronically Signed   By: Alcide CleverMark  Lukens M.D.   On: 07/29/2016 19:47    Scheduled Meds: . buprenorphine-naloxone  1 tablet Sublingual Daily  . cloNIDine  0.2 mg Transdermal Weekly  . feeding supplement  1 Container Oral TID BM  . folic acid  1 mg Oral Daily  . heparin  5,000 Units Subcutaneous Q8H  . LORazepam  0-4 mg Intravenous Q6H   Followed by  . [START ON 08/01/2016] LORazepam  0-4 mg Intravenous Q12H  . metoprolol tartrate  25 mg Oral BID  . multivitamin with minerals  1 tablet Oral Daily  . sodium chloride flush  3 mL Intravenous Q12H  . thiamine  100 mg Oral Daily   Continuous Infusions: . ceFEPime (MAXIPIME) IV 2 g (07/30/16 0616)     LOS: 11 days   Kyrie Fludd, Scheryl MartenSTEPHEN K, MD [facility] Pager [phone]  If 7PM-7AM, please contact night-coverage [email or web address] Password [email or web address] 07/30/2016, 2:36 PM

## 2016-07-30 NOTE — Progress Notes (Signed)
OT Cancellation Note  Patient Details Name: Jennet MaduroChristopher Standen MRN: [id number] DOB: 02/20/1970   Cancelled Treatment:    Reason Eval/Treat Not Completed: Other (comment). Spoke with RN who states that pt has had rough morning and is not resting/calm--prefers we let pt rest for now and try to see later. Will check back as schedule allows.  Evette GeorgesLeonard, Myli Pae Eva [phone] 07/30/2016, 8:59 AM

## 2016-07-30 NOTE — Progress Notes (Signed)
PT Cancellation Note  Patient Details Name: Johnathan [name] MRN: [id number] DOB: 1970-07-09   Cancelled Treatment:    Reason Eval/Treat Not Completed: Other (comment) (hold per RN at this time)   Vernella Niznik B Jaman Aro 07/30/2016, 9:31 AM  Delaney MeigsMaija Tabor Myrian Botello, PT [phone]

## 2016-07-30 NOTE — Progress Notes (Signed)
*  PRELIMINARY RESULTS* Vascular Ultrasound Left upper extremity venous duplex has been completed.  Preliminary findings: Very small segment of acute deep vein thrombosis in one of the radial veins on the left forearm.  Preliminary results [facility], [facility] @ 14:25  [name] 07/30/2016, 2:23 PM

## 2016-07-30 NOTE — Progress Notes (Signed)
ANTICOAGULATION CONSULT NOTE - Initial Consult  Pharmacy Consult for heparin Indication: DVT  No Known Allergies  Patient Measurements: Height: 5\' 11"  (180.3 cm) Weight: 166 lb 3.6 oz (75.4 kg) IBW/kg (Calculated) : 75.3 Heparin Dosing Weight: 76.7kg  Vital Signs: Temp: 98.4 F (36.9 C) (07/24 2000) Temp Source: Axillary (07/24 2000) BP: 145/93 (07/24 2000) Pulse Rate: 85 (07/24 2000)  Labs:  Recent Labs  07/28/16 0428 07/30/16 1926  HGB 9.1* 13.0  HCT 27.8* 39.3  PLT 248 336  HEPARINUNFRC  --  0.29*  CREATININE 0.92  --     Estimated Creatinine Clearance: 108 mL/min (by C-G formula based on SCr of 0.92 mg/dL).   Assessment: 45 YOM not on anticoagulation PTA who is being treated for endocarditis. Now with small segment of acute DVT in radial veins of left forearm. Heparin started and initial level drawn 4 hours early. Level is unexpectedly low given bolus and infusion rate. CBC good. No bleeding noted. Will give smaller bolus and increase rate, then check new level in 6 hours.    Goal of Therapy:  Heparin level 0.3-0.7 units/ml Monitor platelets by anticoagulation protocol: Yes   Plan:  Heparin bolus with 2000 units IV x1, then increase infusion to 1250 units/hr Heparin level in 6h Daily heparin level and CBC  Alfredo BachJoseph Katniss Weedman, PharmD Clinical Pharmacist [phone] (Pager) 07/30/2016 8:46 PM

## 2016-07-30 NOTE — Treatment Plan (Signed)
Patient noted to become more drowsy and lethargic, at times difficult to wake. CT chest and head CT ordered and reviewed. No acute process on CT head. CT chest demonstrates large R and mod L effusions with multiple areas of lung disease. Currently on 3LNC with O2 sats of 100%. STAT abg ordered and reviewed. Not hypercarbic and pO2 of 114. Discussed case with critical care. Plan to transfer patient to SDU. Critical Care to follow along. Have requested IR for diagnostic/therapeutic thoracentesis. Updated family at bedside.

## 2016-07-30 NOTE — Progress Notes (Addendum)
Patient is nonverbal ,but opens his eyes to voice. Unable to follow simple command, very somnolent. Dr Rhona Leavenshiu notified. Will continue to monitor patient.

## 2016-07-31 DIAGNOSIS — J90 Pleural effusion, not elsewhere classified: Secondary | ICD-10-CM

## 2016-07-31 DIAGNOSIS — K7460 Unspecified cirrhosis of liver: Secondary | ICD-10-CM

## 2016-07-31 DIAGNOSIS — Z720 Tobacco use: Secondary | ICD-10-CM

## 2016-07-31 DIAGNOSIS — T1490XA Injury, unspecified, initial encounter: Secondary | ICD-10-CM

## 2016-07-31 DIAGNOSIS — J9600 Acute respiratory failure, unspecified whether with hypoxia or hypercapnia: Secondary | ICD-10-CM

## 2016-07-31 DIAGNOSIS — J432 Centrilobular emphysema: Secondary | ICD-10-CM

## 2016-07-31 LAB — HEPARIN LEVEL (UNFRACTIONATED)
HEPARIN UNFRACTIONATED: 0.17 [IU]/mL — AB (ref 0.30–0.70)
Heparin Unfractionated: 0.23 IU/mL — ABNORMAL LOW (ref 0.30–0.70)

## 2016-07-31 LAB — CBC
HEMATOCRIT: 29.9 % — AB (ref 39.0–52.0)
HEMATOCRIT: 30.5 % — AB (ref 39.0–52.0)
HEMOGLOBIN: 9.7 g/dL — AB (ref 13.0–17.0)
Hemoglobin: 9.9 g/dL — ABNORMAL LOW (ref 13.0–17.0)
MCH: 28 pg (ref 26.0–34.0)
MCH: 28.4 pg (ref 26.0–34.0)
MCHC: 32.4 g/dL (ref 30.0–36.0)
MCHC: 32.5 g/dL (ref 30.0–36.0)
MCV: 86.4 fL (ref 78.0–100.0)
MCV: 87.6 fL (ref 78.0–100.0)
PLATELETS: 292 10*3/uL (ref 150–400)
Platelets: 291 10*3/uL (ref 150–400)
RBC: 3.46 MIL/uL — ABNORMAL LOW (ref 4.22–5.81)
RBC: 3.48 MIL/uL — AB (ref 4.22–5.81)
RDW: 18 % — ABNORMAL HIGH (ref 11.5–15.5)
RDW: 18.4 % — AB (ref 11.5–15.5)
WBC: 8 10*3/uL (ref 4.0–10.5)
WBC: 8 10*3/uL (ref 4.0–10.5)

## 2016-07-31 LAB — BASIC METABOLIC PANEL
ANION GAP: 7 (ref 5–15)
BUN: 40 mg/dL — ABNORMAL HIGH (ref 6–20)
CALCIUM: 8.5 mg/dL — AB (ref 8.9–10.3)
CHLORIDE: 117 mmol/L — AB (ref 101–111)
CO2: 16 mmol/L — AB (ref 22–32)
Creatinine, Ser: 1.18 mg/dL (ref 0.61–1.24)
GFR calc non Af Amer: >60 mL/min (ref >60)
Glucose, Bld: 84 mg/dL (ref 65–99)
Potassium: 4.6 mmol/L (ref 3.5–5.1)
SODIUM: 140 mmol/L (ref 135–145)

## 2016-07-31 NOTE — Progress Notes (Addendum)
ANTICOAGULATION CONSULT NOTE  Pharmacy Consult for heparin Indication: DVT  No Known Allergies  Patient Measurements: Height: 5\' 11"  (180.3 cm) Weight: 166 lb 3.6 oz (75.4 kg) IBW/kg (Calculated) : 75.3 Heparin Dosing Weight: 76.7kg  Vital Signs: Temp: 98.9 F (37.2 C) (07/25 0400) Temp Source: Axillary (07/25 0400) BP: 122/86 (07/25 0700) Pulse Rate: 82 (07/25 0700)  Labs:  Recent Labs  07/30/16 1926 07/30/16 2124 07/31/16 0531 07/31/16 0701  HGB 13.0  --  9.7* 9.9*  HCT 39.3  --  29.9* 30.5*  PLT 336  --  291 292  HEPARINUNFRC 0.29*  --  0.17*  --   CREATININE  --  1.08 1.18  --     Estimated Creatinine Clearance: 84.2 mL/min (by C-G formula based on SCr of 1.18 mg/dL).   Assessment: 45 YOM not on anticoagulation PTA who is being treated for endocarditis. Now with small segment of acute DVT in radial veins of left forearm.  Heparin level this am is still below goal at 0.17 on 1250 units/hr. Noticed hgb down significantly from 13>>9.7 on am labs. No bleeding or IV issues per overnight nurse. Stat repeat CBC shows a slight increase from previous draw 9.7>>9.9. It appears that this may be a dilutional drop from last night. Will monitor closely. Per nurse patient is obtunded but no signs of bleeding noted.   Goal of Therapy:  Heparin level 0.3-0.7 units/ml Monitor platelets by anticoagulation protocol: Yes   Plan:  Increase heparin to 1400 units/hr Re-check a 6 hour heparin level Daily heparin level and CBC Monitor for signs and symptoms of bleeding  Sharin MonsEmily Sinclair, PharmD  PGY2 Infectious Diseases Pharmacy Resident  Pager [phone] 07/31/2016 8:17 AM

## 2016-07-31 NOTE — Progress Notes (Signed)
ANTICOAGULATION CONSULT NOTE  Pharmacy Consult for heparin Indication: DVT  No Known Allergies  Patient Measurements: Height: 5\' 11"  (180.3 cm) Weight: 166 lb 3.6 oz (75.4 kg) IBW/kg (Calculated) : 75.3 Heparin Dosing Weight: 76.7kg  Vital Signs: Temp: 98.9 F (37.2 C) (07/25 0400) Temp Source: Axillary (07/25 0400) BP: 144/94 (07/25 0400) Pulse Rate: 88 (07/25 0401)  Labs:  Recent Labs  07/30/16 1926 07/30/16 2124 07/31/16 0531  HGB 13.0  --  9.7*  HCT 39.3  --  29.9*  PLT 336  --  291  HEPARINUNFRC 0.29*  --  0.17*  CREATININE  --  1.08 1.18    Estimated Creatinine Clearance: 84.2 mL/min (by C-G formula based on SCr of 1.18 mg/dL).   Assessment: 45 YOM not on anticoagulation PTA who is being treated for endocarditis. Now with small segment of acute DVT in radial veins of left forearm.  Heparin level this am is still below goal at 0.17 on 1250 units/hr. Noticed hgb down significantly from 13>>9.7 on am labs. No bleeding or IV issues per overnight nurse. Will do a STAT repeat before making any adjustments.   Goal of Therapy:  Heparin level 0.3-0.7 units/ml Monitor platelets by anticoagulation protocol: Yes   Plan:  Pending repeat cbc  Sheppard CoilFrank Jaxon Mynhier PharmD., BCPS Clinical Pharmacist Pager [phone] 07/31/2016 6:30 AM

## 2016-07-31 NOTE — Progress Notes (Signed)
ANTICOAGULATION CONSULT NOTE  Pharmacy Consult for heparin Indication: DVT  No Known Allergies  Patient Measurements: Height: 5\' 11"  (180.3 cm) Weight: 166 lb 3.6 oz (75.4 kg) IBW/kg (Calculated) : 75.3 Heparin Dosing Weight: 76.7kg  Vital Signs: BP: 128/94 (07/25 1500) Pulse Rate: 104 (07/25 1500)  Labs:  Recent Labs  07/30/16 1926 07/30/16 2124 07/31/16 0531 07/31/16 0701 07/31/16 1532  HGB 13.0  --  9.7* 9.9*  --   HCT 39.3  --  29.9* 30.5*  --   PLT 336  --  291 292  --   HEPARINUNFRC 0.29*  --  0.17*  --  0.23*  CREATININE  --  1.08 1.18  --   --     Estimated Creatinine Clearance: 84.2 mL/min (by C-G formula based on SCr of 1.18 mg/dL).   Assessment: 45 YOM not on anticoagulation PTA who is being treated for endocarditis. Now with small segment of acute DVT in radial veins of left forearm.  Heparin level this PM is still subtherapeutic (0.23). Spoke with nurse. No issues with line or bleeding. Will increase a little more aggressively due to several low levels in a row.   Goal of Therapy:  Heparin level 0.3-0.7 units/ml Monitor platelets by anticoagulation protocol: Yes   Plan:  Increase heparin to 1600 units/hr Re-check a 6 hour heparin level Daily heparin level and CBC Monitor for signs and symptoms of bleeding  Sharin MonsEmily Sinclair, PharmD  PGY2 Infectious Diseases Pharmacy Resident  Pager [phone] 07/31/2016 4:13 PM

## 2016-07-31 NOTE — Progress Notes (Signed)
OT Cancellation Note  Patient Details Name: Johnathan [name] MRN: [id number] DOB: 1970/12/12   Cancelled Treatment:    Reason Eval/Treat Not Completed: Medical issues which prohibited therapy.  Pt not following commands, per RN, and awaiting thoracentesis.  Will reattempt.  Corina Stacy Cetroniaonarpe, OTR/L [phone]   [name], [name] M 07/31/2016, 1:13 PM

## 2016-07-31 NOTE — Progress Notes (Signed)
    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  Interval History: repeat blood culture 7/15 remained negative and final. No fever.  Getting ativan every 6 hours per CIWA. No response otherwise.   Physical Exam: Constitutional:  Vitals:   07/31/16 1100 07/31/16 1200  BP: 140/90 (!) 145/97  Pulse: 94 95  Resp: 14 13  Temp:     patient  Is sleeping, arousable but notinteractive Respiratory: Normal respiratory effort; CTA B Cardiovascular: RRR GI: soft, nt, nd  Review of Systems: Unable to assess due to mental status  Lab Results  Component Value Date   WBC 8.0 07/31/2016   HGB 9.9 (L) 07/31/2016   HCT 30.5 (L) 07/31/2016   MCV 87.6 07/31/2016   PLT 292 07/31/2016    Lab Results  Component Value Date   CREATININE 1.18 07/31/2016   BUN 40 (H) 07/31/2016   NA 140 07/31/2016   K 4.6 07/31/2016   CL 117 (H) 07/31/2016   CO2 16 (L) 07/31/2016    Lab Results  Component Value Date   ALT 13 (L) 07/30/2016   AST 21 07/30/2016   ALKPHOS 98 07/30/2016     Microbiology: Recent Results (from the past 240 hour(s))  Culture, blood (single)     Status: None   Collection Time: 07/21/16  1:44 PM  Result Value Ref Range Status   Specimen Description BLOOD BLOOD RIGHT HAND  Final   Special Requests   Final    BOTTLES DRAWN AEROBIC AND ANAEROBIC Blood Culture adequate volume   Culture NO GROWTH 5 DAYS  Final   Report Status 07/26/2016 FINAL  Final  MRSA PCR Screening     Status: None   Collection Time: 07/23/16 10:30 AM  Result Value Ref Range Status   MRSA by PCR NEGATIVE NEGATIVE Final    Comment:        The GeneXpert MRSA Assay (FDA approved for NASAL specimens only), is one component of a comprehensive MRSA colonization surveillance program. It is not intended to diagnose MRSA infection nor to guide or monitor treatment for MRSA infections.   Urine culture     Status: None   Collection Time: 07/23/16  4:41 PM  Result  Value Ref Range Status   Specimen Description URINE, CATHETERIZED  Final   Special Requests NONE  Final   Culture NO GROWTH  Final   Report Status 07/24/2016 FINAL  Final    Impression/Plan:  1. TV endocarditis - two positive organisms and remains on cefepime to cover both.  continue long-term cefepime through August 25th.   2.  Substance abuse - on suboxone and will need to continue.    3.  Disposition - he will not be a candidate for home IV antibiotics so will need placement or remain here for the duration.    I will follow up again on Friday

## 2016-07-31 NOTE — Progress Notes (Signed)
PROGRESS NOTE    Johnathan [name]  WNU:[id number] DOB: Feb 04, 1970 DOA: 07/19/2016 PCP: [name], MD   Chief Complaint  Patient presents with  . Cellulitis     Brief Narrative:  HPI On 07/19/2016 by Dr. [name] [name] a 46 y.o.male,w Hep C?, IVDA, (recently injected opana) apparently c/o fever intermittently for the past 1 week, and redness over the dorsum of the right foot and slight swelling. Pt notes injecting Opana about 2 days ago. Pt denies any recent dental work. Pt presented to ED due to subjective fevers.  In ED, Pt found to have mild cellulitis of the right >left foot as well as uti wbc 6-30. Pt noted to be in ARF bun/creat 26/3.17, And to have abnormal LFT, Ast 47, Alt 25, Alk phos 206, T. Bili 1.6, Alb 2.4. And hyponatremia Na 123 , CXR right lung base slightly more patchy may reflect aspiration or pneumonia. Possible small right pleural effusion Pt will be admitted for sepsis secondary to uti vs cellulitis and ARF and hyponatremia. (note LP pending due to ? Neck stiffness)  Interim history Since his admission he has been found to have MSSA bacteremia, possible meningitis, and LE cellulitis. TTE showed EF 03-47%, grade 1 diastolic dysfunction, and tricuspid valve with a mobile vegetation. On 7/17 he developed acute encephalopathy with agitation, becoming belligerent and combative. He was placed on CIWA protocol, however patient was not responsive to ativan and required transfer to the ICU for a precedex gtt.  Assessment & Plan   RLL Cavitary pneumonia with pleural effusion -Septic emboli versus aspiration -Noted to have increased work of breathing yesterday and needing oxygen -PCCM consulted and following -Continue supplemental oxygen to maintain saturations above 92% -Interventional radiology consulted and appreciated, pending right thoracentesis. Labs have been ordered  Sepsis secondary to MSSA bacteremia/endocarditis of the  tricuspid valve/Pseudomonas bacteremia -Patient did have positive blood cultures for Pseudomonas as well as MSSA -Infectious disease consulted and appreciated -Continue cefepime -Patient status post echocardiogram as well as TEE showing tricuspid valve endocarditis, EF of 42-59%, grade 1 diastolic dysfunction -Given patient's IV drug abuse, and history of injection, patient will need SNF versus prolonged hospital stay for antibiotics -Case management social work consulted  Acute encephalopathy/withdrawal -Currently on Haldol for agitation -Patient did require ICU/Precedex drip for agitation and withdrawal -Currently on Haldol for agitation  Acute kidney injury -Resolved -Secondary to sepsis -Renal ultrasound showed mild right pelvicaliectasis with ureteral jet noted and urinary bladder excluding significant ureteral obstruction -Creatinine on admission 3.17 currently 5.63  Illicit drug use/polysubstance abuse -Discussed cessation -Currently on Suboxone  Acute LUE DVT -Started on heparin drip -Patient will need oral anticoagulation unable to tolerate  Hepatitis C -Will need outpatient follow-up to determine if patient is a candidate for therapy -LFTs appear to be stable  Moderate malnutrition/hypoalbuminemia -Nutrition consulted, continue supplements  Abnormal thyroid testing -Given clinical illness, would not start treatment at this time -TSH is 0.208, FT4 2.51 -Would repeat thyroid testing upon discharge  Cellulitis of the right lower extremity -Resolved  DVT Prophylaxis  heparin  Code Status: Full  Family Communication: None at bedside  Disposition Plan: To be determined. Currently in stepdown  Consultants Infectious disease PCCM Cardiology Interventional radiology  Procedures  Lumbar puncture Renal US Echocardiogram TEE Lower extremity Doppler Left upper extremity Doppler  Antibiotics   Anti-infectives    Start     Dose/Rate Route Frequency Ordered  Stop   07/22/16 1400  ceFEPIme (MAXIPIME) 2 g in dextrose 5 %  50 mL IVPB     2 g 100 mL/hr over 30 Minutes Intravenous Every 8 hours 07/22/16 1122 08/31/16 2359   07/20/16 1500  ceFEPIme (MAXIPIME) 2 g in dextrose 5 % 50 mL IVPB  Status:  Discontinued     2 g 100 mL/hr over 30 Minutes Intravenous Every 12 hours 07/20/16 1410 07/22/16 1122   07/19/16 2300  vancomycin (VANCOCIN) IVPB 750 mg/150 ml premix  Status:  Discontinued     750 mg 150 mL/hr over 60 Minutes Intravenous Every 24 hours 07/19/16 0237 07/19/16 0953   07/19/16 2200  ceFEPIme (MAXIPIME) 2 g in dextrose 5 % 50 mL IVPB  Status:  Discontinued     2 g 100 mL/hr over 30 Minutes Intravenous Every 24 hours 07/19/16 1931 07/20/16 1410   07/19/16 2000  nafcillin 2 g in dextrose 5 % 100 mL IVPB  Status:  Discontinued     2 g 200 mL/hr over 30 Minutes Intravenous Every 4 hours 07/19/16 1915 07/22/16 1130   07/19/16 1915  nafcillin injection 2 g  Status:  Discontinued     2 g Intravenous Every 4 hours 07/19/16 1912 07/19/16 1915   07/19/16 1800  ceFEPIme (MAXIPIME) 2 g in dextrose 5 % 50 mL IVPB  Status:  Discontinued     2 g 100 mL/hr over 30 Minutes Intravenous Every 24 hours 07/19/16 1646 07/19/16 1912   07/19/16 1100  vancomycin (VANCOCIN) 500 mg in sodium chloride 0.9 % 100 mL IVPB  Status:  Discontinued     500 mg 100 mL/hr over 60 Minutes Intravenous Every 12 hours 07/19/16 0953 07/19/16 1645   07/19/16 0800  piperacillin-tazobactam (ZOSYN) IVPB 3.375 g  Status:  Discontinued     3.375 g 12.5 mL/hr over 240 Minutes Intravenous Every 8 hours 07/19/16 0239 07/19/16 1645   07/19/16 0200  cefTRIAXone (ROCEPHIN) 2 g in dextrose 5 % 50 mL IVPB     2 g 100 mL/hr over 30 Minutes Intravenous  Once 07/19/16 0140 07/19/16 0259   07/19/16 0130  cefTRIAXone (ROCEPHIN) 1 g in dextrose 5 % 50 mL IVPB  Status:  Discontinued     1 g 100 mL/hr over 30 Minutes Intravenous  Once 07/19/16 0125 07/19/16 0140   07/19/16 0115   piperacillin-tazobactam (ZOSYN) IVPB 3.375 g     3.375 g 100 mL/hr over 30 Minutes Intravenous  Once 07/19/16 0109 07/19/16 0219   07/19/16 0115  vancomycin (VANCOCIN) IVPB 1000 mg/200 mL premix     1,000 mg 200 mL/hr over 60 Minutes Intravenous  Once 07/19/16 0109 07/19/16 0243      Subjective:   Marius Ditch seen and examined today.  Not very interactive today.   Objective:   Vitals:   07/31/16 0700 07/31/16 0800 07/31/16 0900 07/31/16 1000  BP: 122/86 (!) 142/92 116/80 (!) 143/90  Pulse: 82 91 80 98  Resp: '12 18 13 15  ' Temp:      TempSrc:      SpO2: 100% 99% 99% 100%  Weight:      Height:        Intake/Output Summary (Last 24 hours) at 07/31/16 1156 Last data filed at 07/31/16 1000  Gross per 24 hour  Intake           406.65 ml  Output              250 ml  Net           156.65 ml   [employer]  07/29/16 1955 07/30/16 2000 07/31/16 0500  Weight: 76.7 kg (169 lb 1.5 oz) 75.4 kg (166 lb 3.6 oz) 75.4 kg (166 lb 3.6 oz)   Exam  General: Well developed, ill-appearing, NAD  HEENT: NCAT, mucous membranes moist.   Cardiovascular: S1 S2 auscultated, 2/6 SEM, RRR  Respiratory: Clear to auscultation bilaterally with equal chest rise  Abdomen: Soft, nontender, nondistended, + bowel sounds  Extremities: warm dry without cyanosis clubbing. Upper/lower ext edema +1  Neuro: Cannot fully assess due to cooperation. Moving all extremities with ease  Skin: Without rashes exudates or nodules, multiple tattoos  Data Reviewed: I have personally reviewed following labs and imaging studies  CBC:  Recent Labs Lab 07/27/16 0256 07/28/16 0428 07/30/16 1926 07/31/16 0531 07/31/16 0701  WBC 6.5 5.7 16.6* 8.0 8.0  HGB 10.0* 9.1* 13.0 9.7* 9.9*  HCT 31.2* 27.8* 39.3 29.9* 30.5*  MCV 84.6 83.7 86.6 86.4 87.6  PLT 231 248 336 291 850   Basic Metabolic Panel:  Recent Labs Lab 07/25/16 0237 07/26/16 0211 07/27/16 0256 07/28/16 0428 07/30/16 2124  07/31/16 0531  NA 137 139 143 143 137 140  K 3.7 3.8 3.6 4.1 4.8 4.6  CL 109 115* 118* 118* 115* 117*  CO2 21* 18* 17* 19* 18* 16*  GLUCOSE 155* 130* 87 86 92 84  BUN 26* 27* 27* 26* 38* 40*  CREATININE 0.96 0.88 0.85 0.92 1.08 1.18  CALCIUM 7.7* 7.7* 8.0* 8.1* 8.5* 8.5*  MG 2.1  --  2.0  --   --   --   PHOS 3.9  --  4.6  --   --   --    GFR: Estimated Creatinine Clearance: 84.2 mL/min (by C-G formula based on SCr of 1.18 mg/dL). Liver Function Tests:  Recent Labs Lab 07/28/16 0428 07/30/16 2124  AST 22 21  ALT 16* 13*  ALKPHOS 107 98  BILITOT 1.4* 1.0  PROT 6.6 6.8  ALBUMIN 1.4* 1.4*   No results for input(s): LIPASE, AMYLASE in the last 168 hours. No results for input(s): AMMONIA in the last 168 hours. Coagulation Profile: No results for input(s): INR, PROTIME in the last 168 hours. Cardiac Enzymes: No results for input(s): CKTOTAL, CKMB, CKMBINDEX, TROPONINI in the last 168 hours. BNP (last 3 results) No results for input(s): PROBNP in the last 8760 hours. HbA1C: No results for input(s): HGBA1C in the last 72 hours. CBG:  Recent Labs Lab 07/29/16 1114 07/29/16 1526 07/30/16 0736 07/30/16 1207 07/30/16 1712  GLUCAP 83 89 97 90 88   Lipid Profile: No results for input(s): CHOL, HDL, LDLCALC, TRIG, CHOLHDL, LDLDIRECT in the last 72 hours. Thyroid Function Tests: No results for input(s): TSH, T4TOTAL, FREET4, T3FREE, THYROIDAB in the last 72 hours. Anemia Panel: No results for input(s): VITAMINB12, FOLATE, FERRITIN, TIBC, IRON, RETICCTPCT in the last 72 hours. Urine analysis:    Component Value Date/Time   COLORURINE AMBER (A) 07/28/2016 1622   APPEARANCEUR TURBID (A) 07/28/2016 1622   LABSPEC 1.034 (H) 07/28/2016 1622   PHURINE 5.0 07/28/2016 1622   GLUCOSEU 50 (A) 07/28/2016 1622   HGBUR LARGE (A) 07/28/2016 1622   BILIRUBINUR SMALL (A) 07/28/2016 1622   KETONESUR 5 (A) 07/28/2016 1622   PROTEINUR >=300 (A) 07/28/2016 1622   NITRITE NEGATIVE  07/28/2016 1622   LEUKOCYTESUR NEGATIVE 07/28/2016 1622   Sepsis Labs: '@LABRCNTIP' (procalcitonin:4,lacticidven:4)  ) Recent Results (from the past 240 hour(s))  Culture, blood (single)     Status: None   Collection Time: 07/21/16  1:44 PM  Result Value Ref Range Status   Specimen Description BLOOD BLOOD RIGHT HAND  Final   Special Requests   Final    BOTTLES DRAWN AEROBIC AND ANAEROBIC Blood Culture adequate volume   Culture NO GROWTH 5 DAYS  Final   Report Status 07/26/2016 FINAL  Final  MRSA PCR Screening     Status: None   Collection Time: 07/23/16 10:30 AM  Result Value Ref Range Status   MRSA by PCR NEGATIVE NEGATIVE Final    Comment:        The GeneXpert MRSA Assay (FDA approved for NASAL specimens only), is one component of a comprehensive MRSA colonization surveillance program. It is not intended to diagnose MRSA infection nor to guide or monitor treatment for MRSA infections.   Urine culture     Status: None   Collection Time: 07/23/16  4:41 PM  Result Value Ref Range Status   Specimen Description URINE, CATHETERIZED  Final   Special Requests NONE  Final   Culture NO GROWTH  Final   Report Status 07/24/2016 FINAL  Final      Radiology Studies: Dg Ankle 2 Views Left  Result Date: 07/29/2016 CLINICAL DATA:  Blunt trauma while transferring patient to bed EXAM: LEFT ANKLE - 2 VIEW COMPARISON:  None. FINDINGS: Mild soft tissue swelling is noted laterally. No acute fracture or dislocation is seen. No other focal abnormality is noted. IMPRESSION: Mild soft tissue swelling without acute bony abnormality. Electronically Signed   By: Inez Catalina M.D.   On: 07/29/2016 19:47   Ct Head Wo Contrast  Result Date: 07/30/2016 CLINICAL DATA:  Mental status change.  Pulmonary embolism. EXAM: CT HEAD WITHOUT CONTRAST TECHNIQUE: Contiguous axial images were obtained from the base of the skull through the vertex without intravenous contrast. COMPARISON:  CT head without contrast  07/22/2016 FINDINGS: Brain: Mild generalized atrophy is again noted no acute infarct, hemorrhage, or mass lesion is present. There is no significant white matter disease. The ventricles are of normal size. No significant extraaxial fluid collection is present. The Vascular: No hyperdense vessel or unexpected calcification. Skull: The calvarium is intact. No focal lytic or blastic lesions are present. Sinuses/Orbits: The paranasal sinuses and mastoid air cells are clear. The globes and orbits are within normal limits bilaterally. IMPRESSION: 1. Stable mild generalized atrophy. 2. No acute intracranial abnormality. Electronically Signed   By: San Morelle M.D.   On: 07/30/2016 16:40   Ct Angio Chest Pe W Or Wo Contrast  Result Date: 07/30/2016 CLINICAL DATA:  46 year old with mental status change. Bacteremia. Preliminary results of a left upper extremity venous duplex demonstrated small thrombus in one of the radial veins. EXAM: CT ANGIOGRAPHY CHEST WITH CONTRAST TECHNIQUE: Multidetector CT imaging of the chest was performed using the standard protocol during bolus administration of intravenous contrast. Multiplanar CT image reconstructions and MIPs were obtained to evaluate the vascular anatomy. CONTRAST:  100 mL Isovue 370 COMPARISON:  Chest radiograph 07/28/2016 and chest CT 11/06/2003 FINDINGS: Cardiovascular: Negative for pulmonary embolism. Pulmonary arteries are well opacified on this examination. 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 Aorta measured 3.7 cm in 2005. Limited evaluation for dissection on this examination but no suspicious abnormalities in thoracic aorta other than the aneurysm. Heart size is within normal limits. No pericardial fluid. Mediastinum/Nodes: No significant chest lymphadenopathy but limited evaluation due to the extensive pleuroparenchymal lung disease. Lungs/Pleura: Large right pleural effusion and moderate-sized left pleural effusion. Severe  emphysematous disease in the lungs.  There is a combination of centrilobular and paraseptal emphysema. Near complete collapse and consolidation of the right lower lobe related to the right pleural effusion. There is a prominent 1.1 cm nodule along the right minor fissure on sequence 6, image 97. There is focal consolidation along the posterior aspect of the right minor fissure which may be involving both the right upper lobe and the right middle lobe. This area roughly measures up to 3.4 cm on the soft tissue windows on sequence 5, image 90. This could represent a cavitary lesion with adjacent pleural thickening. Volume loss in the posterior right upper lobe related to the large pleural effusion. There may be necrotic lung tissue in the posterior right upper lung on sequence 6, image 53. Large bleb at the left lung apex. Poorly defined hazy nodular structure in the left upper lobe with a branching configuration on sequence 6, image 57. Pleural-based nodular lesion in the lingula measuring up to 2.3 cm on sequence 6, image 18. Volume loss in the left lower lobe associated with the pleural fluid. Upper Abdomen: Upper abdominal ascites, particularly around the liver. Liver has a nodular contour and compatible with cirrhosis. Spleen is enlarged. Limited evaluation of the portal venous system on this examination. Musculoskeletal: No acute bone abnormality. Review of the MIP images confirms the above findings. IMPRESSION: Negative for a pulmonary embolism. Extensive pleural-parenchymal lung disease bilaterally. Large right pleural effusion and moderate-sized left pleural effusion. Pleural-based nodular opacities could represent an infectious etiology but also raise concern for a neoplastic process. Concern for a cavitary lesion and necrotic tissue in the right lung. Consider sampling of the pleural fluid. Recommend follow-up CT when the pleural fluid and acute process have resolved to exclude neoplastic disease. Severe  emphysematous disease. Cirrhosis with evidence for portal hypertension based on the splenomegaly and ascites. 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 Recommend annual imaging followup by CTA or MRA. This recommendation follows 2010 ACCF/AHA/AATS/ACR/ASA/SCA/SCAI/SIR/STS/SVM Guidelines for the Diagnosis and Management of Patients with Thoracic Aortic Disease. Circulation. 2010; 121: J478-G956 Electronically Signed   By: Markus Daft M.D.   On: 07/30/2016 17:05     Scheduled Meds: . buprenorphine-naloxone  1 tablet Sublingual Daily  . cloNIDine  0.2 mg Transdermal Weekly  . feeding supplement  1 Container Oral TID BM  . folic acid  1 mg Oral Daily  . LORazepam  0-4 mg Intravenous Q6H   Followed by  . [START ON 08/01/2016] LORazepam  0-4 mg Intravenous Q12H  . metoprolol tartrate  5 mg Intravenous Q6H  . sodium chloride flush  3 mL Intravenous Q12H   Continuous Infusions: . ceFEPime (MAXIPIME) IV Stopped (07/31/16 0603)  . heparin 1,400 Units/hr (07/31/16 0910)     LOS: 12 days   Time Spent in minutes   45 minutes  Domingue Coltrain D.O. on 07/31/2016 at 11:56 AM  Between 7am to 7pm - Pager - [phone]  After 7pm go to [email or web address] - password [email or web address]  And look for the night coverage person covering for me after hours  [facility] Office  [phone]

## 2016-07-31 NOTE — Clinical Social Work Note (Signed)
CSW continues to follow for appropriateness to discuss substance abuse resources. According to RN assessment, patient unable to follow commands.  Charlynn CourtSarah Sherrey North, CSW [phone]

## 2016-07-31 NOTE — Progress Notes (Signed)
PCCM Progress Note  Admission date: 07/19/2016  CC: Cellulitis  HPI: 46 yo male with fever, sepsis, pneumonia and redness on Rt foot from cellulitis.  PMHx of IVDA.  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  Subjective: Doesn't want to talk.  Intermittent coughing.  Vital signs: BP 122/86   Pulse 82   Temp 98.9 F (37.2 C) (Axillary)   Resp 12   Ht 5\' 11"  (1.803 m)   Wt 166 lb 3.6 oz (75.4 kg)   SpO2 100%   BMI 23.18 kg/m   Intake/output: I/O last 3 completed shifts: In: 405.4 [I.V.:155.4; IV Piggyback:250] Out: 260 [Urine:260]  General: sleepy Neuro: wakes up easily HEENT: no stridor Cardiac: HR regular with 2/6 SM Chest: decreased BS at based with b/l rhonchi Abd: soft, non tender Ext: 1+ edema Skin: multiple tattoos    CMP Latest Ref Rng & Units 07/31/2016 07/30/2016 07/28/2016  Glucose 65 - 99 mg/dL 84 92 86  BUN 6 - 20 mg/dL 40(J40(H) 81(X38(H) 91(Y26(H)  Creatinine 0.61 - 1.24 mg/dL 7.821.18 9.561.08 2.130.92  Sodium 135 - 145 mmol/L 140 137 143  Potassium 3.5 - 5.1 mmol/L 4.6 4.8 4.1  Chloride 101 - 111 mmol/L 117(H) 115(H) 118(H)  CO2 22 - 32 mmol/L 16(L) 18(L) 19(L)  Calcium 8.9 - 10.3 mg/dL 0.8(M8.5(L) 5.7(Q8.5(L) 8.1(L)  Total Protein 6.5 - 8.1 g/dL - 6.8 6.6  Total Bilirubin 0.3 - 1.2 mg/dL - 1.0 4.6(N1.4(H)  Alkaline Phos 38 - 126 U/L - 98 107  AST 15 - 41 U/L - 21 22  ALT 17 - 63 U/L - 13(L) 16(L)     CBC Latest Ref Rng & Units 07/31/2016 07/31/2016 07/30/2016  WBC 4.0 - 10.5 K/uL 8.0 8.0 16.6(H)  Hemoglobin 13.0 - 17.0 g/dL 6.2(X9.9(L) 5.2(W9.7(L) 41.313.0  Hematocrit 39.0 - 52.0 % 30.5(L) 29.9(L) 39.3  Platelets 150 - 400 K/uL 292 291 336     ABG    Component Value Date/Time   PHART 7.319 (L) 07/30/2016 1834   PCO2ART 32.8 07/30/2016 1834   PO2ART 114 (H) 07/30/2016 1834   HCO3 16.4 (L) 07/30/2016 1834   ACIDBASEDEF 8.6 (H) 07/30/2016 1834   O2SAT 98.4 07/30/2016 1834     CBG (last 3)   Recent Labs  07/30/16 0736  07/30/16 1207 07/30/16 1712  GLUCAP 97 90 88     Imaging: Dg Ankle 2 Views Left  Result Date: 07/29/2016 CLINICAL DATA:  Blunt trauma while transferring patient to bed EXAM: LEFT ANKLE - 2 VIEW COMPARISON:  None. FINDINGS: Mild soft tissue swelling is noted laterally. No acute fracture or dislocation is seen. No other focal abnormality is noted. IMPRESSION: Mild soft tissue swelling without acute bony abnormality. Electronically Signed   By: Alcide CleverMark  Lukens M.D.   On: 07/29/2016 19:47   Ct Head Wo Contrast  Result Date: 07/30/2016 CLINICAL DATA:  Mental status change.  Pulmonary embolism. EXAM: CT HEAD WITHOUT CONTRAST TECHNIQUE: Contiguous axial images were obtained from the base of the skull through the vertex without intravenous contrast. COMPARISON:  CT head without contrast 07/22/2016 FINDINGS: Brain: Mild generalized atrophy is again noted no acute infarct, hemorrhage, or mass lesion is present. There is no significant white matter disease. The ventricles are of normal size. No significant extraaxial fluid collection is present. The Vascular: No hyperdense vessel or unexpected calcification. Skull: The calvarium is intact. No focal lytic or blastic lesions are present. Sinuses/Orbits: The paranasal sinuses and mastoid air cells are clear. The  globes and orbits are within normal limits bilaterally. IMPRESSION: 1. Stable mild generalized atrophy. 2. No acute intracranial abnormality. Electronically Signed   By: Marin Roberts M.D.   On: 07/30/2016 16:40   Ct Angio Chest Pe W Or Wo Contrast  Result Date: 07/30/2016 CLINICAL DATA:  46 year old with mental status change. Bacteremia. Preliminary results of a left upper extremity venous duplex demonstrated small thrombus in one of the radial veins. EXAM: CT ANGIOGRAPHY CHEST WITH CONTRAST TECHNIQUE: Multidetector CT imaging of the chest was performed using the standard protocol during bolus administration of intravenous contrast. Multiplanar CT  image reconstructions and MIPs were obtained to evaluate the vascular anatomy. CONTRAST:  100 mL Isovue 370 COMPARISON:  Chest radiograph 07/28/2016 and chest CT 11/06/2003 FINDINGS: Cardiovascular: Negative for pulmonary embolism. Pulmonary arteries are well opacified on this examination. 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 Aorta measured 3.7 cm in 2005. Limited evaluation for dissection on this examination but no suspicious abnormalities in thoracic aorta other than the aneurysm. Heart size is within normal limits. No pericardial fluid. Mediastinum/Nodes: No significant chest lymphadenopathy but limited evaluation due to the extensive pleuroparenchymal lung disease. Lungs/Pleura: Large right pleural effusion and moderate-sized left pleural effusion. Severe emphysematous disease in the lungs. There is a combination of centrilobular and paraseptal emphysema. Near complete collapse and consolidation of the right lower lobe related to the right pleural effusion. There is a prominent 1.1 cm nodule along the right minor fissure on sequence 6, image 97. There is focal consolidation along the posterior aspect of the right minor fissure which may be involving both the right upper lobe and the right middle lobe. This area roughly measures up to 3.4 cm on the soft tissue windows on sequence 5, image 90. This could represent a cavitary lesion with adjacent pleural thickening. Volume loss in the posterior right upper lobe related to the large pleural effusion. There may be necrotic lung tissue in the posterior right upper lung on sequence 6, image 53. Large bleb at the left lung apex. Poorly defined hazy nodular structure in the left upper lobe with a branching configuration on sequence 6, image 57. Pleural-based nodular lesion in the lingula measuring up to 2.3 cm on sequence 6, image 18. Volume loss in the left lower lobe associated with the pleural fluid. Upper Abdomen: Upper abdominal  ascites, particularly around the liver. Liver has a nodular contour and compatible with cirrhosis. Spleen is enlarged. Limited evaluation of the portal venous system on this examination. Musculoskeletal: No acute bone abnormality. Review of the MIP images confirms the above findings. IMPRESSION: Negative for a pulmonary embolism. Extensive pleural-parenchymal lung disease bilaterally. Large right pleural effusion and moderate-sized left pleural effusion. Pleural-based nodular opacities could represent an infectious etiology but also raise concern for a neoplastic process. Concern for a cavitary lesion and necrotic tissue in the right lung. Consider sampling of the pleural fluid. Recommend follow-up CT when the pleural fluid and acute process have resolved to exclude neoplastic disease. Severe emphysematous disease. Cirrhosis with evidence for portal hypertension based on the splenomegaly and ascites. 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 Recommend annual imaging followup by CTA or MRA. This recommendation follows 2010 ACCF/AHA/AATS/ACR/ASA/SCA/SCAI/SIR/STS/SVM Guidelines for the Diagnosis and Management of Patients with Thoracic Aortic Disease. Circulation. 2010; 121: Z610-R604 Electronically Signed   By: Richarda Overlie M.D.   On: 07/30/2016 17:05     Studies: LP 7/13 >> glucose 52, RBC 555, WBC 51, protein  88 Echo 7/14 >> EF 55 to 60%, grade 1 DD, mobile vegetation on TV Doppler lower legs b/l 7/17 >> no DVT Doppler Lt arm 7/24 >> acute DVT radial vein  CT angio chest 7/24 >> ascending TAA 4.3 cm, b/l effusions Rt > Lt, severe emphysema, Rt lung collapse/consolidation, changes of cirrhosis with ascites and splenomegaly  Antibiotics: Vancomycin 7/13 >> 7/13 Zosyn 7/13 >> 7/13 Nafcillin 7/13 >> 7/16 Cefepime 7/16 >>  Cultures: Blood 7/12 >> MSSA, Pseudomonas HIV 7/13 >> negative CSF 7/13 >> negative RVP 7/13 >> negative Blood 7/14 >> MSSA  Lines/tubes:  Events: 7/13  admit, ID consult for MSSA and Pseudomonas bacteremia 7/17 Transfer to ICU for precedex for opiate withdrawal symptoms 7/22 Transfer to SDU  Assessment/plan:  Rt cavitary PNA likely from septic emboli with Rt pleural effusion. - order for IR to do thoracentesis on Rt >> send for glucose, protein, LDH, cell count, culture, and cytology - f/u CXR after thoracentesis  Tobacco abuse with centrilobular/paraseptal emphysema. - prn BDs - assess further as outpt with PFT, A1AT level  MSSA bacteremia. Pseudomonal bacteremia. TV endocarditis. - Abx per ID and primary team  Hep C with cirrhosis, splenomegaly, and ascites. - per primary team  Lt arm DVT. - heparin gtt to be held for thoracentesis  Opiate withdrawal. - suboxone  Coralyn HellingVineet Philippe Gang, MD [facility] 07/31/2016, 8:29 AM Pager:  [phone] After 3pm call: [phone]

## 2016-07-31 NOTE — Progress Notes (Signed)
PT Cancellation Note  Patient Details Name: Johnathan [name] MRN: [id number] DOB: 06/03/1970   Cancelled Treatment:    Reason Eval/Treat Not Completed: Medical issues which prohibited therapy.  Pt not following commands, per RN, and awaiting thoracentesis.  Will reattempt.   Johnathan [name] 07/31/2016, 3:13 PM Johnathan [name], PT, [facility] Pager [phone]

## 2016-08-01 ENCOUNTER — Inpatient Hospital Stay (HOSPITAL_COMMUNITY): Payer: Medicaid Other

## 2016-08-01 ENCOUNTER — Encounter (HOSPITAL_COMMUNITY): Payer: Self-pay | Admitting: Radiology

## 2016-08-01 HISTORY — PX: IR THORACENTESIS ASP PLEURAL SPACE W/IMG GUIDE: IMG5380

## 2016-08-01 LAB — BASIC METABOLIC PANEL
ANION GAP: 7 (ref 5–15)
BUN: 44 mg/dL — ABNORMAL HIGH (ref 6–20)
CALCIUM: 8.8 mg/dL — AB (ref 8.9–10.3)
CHLORIDE: 119 mmol/L — AB (ref 101–111)
CO2: 14 mmol/L — AB (ref 22–32)
Creatinine, Ser: 1.35 mg/dL — ABNORMAL HIGH (ref 0.61–1.24)
GFR calc non Af Amer: >60 mL/min (ref >60)
Glucose, Bld: 73 mg/dL (ref 65–99)
POTASSIUM: 4.9 mmol/L (ref 3.5–5.1)
Sodium: 140 mmol/L (ref 135–145)

## 2016-08-01 LAB — HEPARIN LEVEL (UNFRACTIONATED)
HEPARIN UNFRACTIONATED: 0.35 [IU]/mL (ref 0.30–0.70)
Heparin Unfractionated: 0.54 IU/mL (ref 0.30–0.70)

## 2016-08-01 LAB — CBC
HEMATOCRIT: 30.8 % — AB (ref 39.0–52.0)
HEMOGLOBIN: 9.9 g/dL — AB (ref 13.0–17.0)
MCH: 28.1 pg (ref 26.0–34.0)
MCHC: 32.1 g/dL (ref 30.0–36.0)
MCV: 87.5 fL (ref 78.0–100.0)
Platelets: 312 10*3/uL (ref 150–400)
RBC: 3.52 MIL/uL — AB (ref 4.22–5.81)
RDW: 18.5 % — AB (ref 11.5–15.5)
WBC: 9.4 10*3/uL (ref 4.0–10.5)

## 2016-08-01 LAB — BODY FLUID CELL COUNT WITH DIFFERENTIAL
Lymphs, Fluid: 11 %
Monocyte-Macrophage-Serous Fluid: 7 % — ABNORMAL LOW (ref 50–90)
NEUTROPHIL FLUID: 82 % — AB (ref 0–25)
WBC FLUID: 2431 uL — AB (ref 0–1000)

## 2016-08-01 LAB — GRAM STAIN

## 2016-08-01 LAB — RAPID URINE DRUG SCREEN, HOSP PERFORMED
Amphetamines: NOT DETECTED
BENZODIAZEPINES: POSITIVE — AB
Barbiturates: NOT DETECTED
COCAINE: NOT DETECTED
OPIATES: NOT DETECTED
TETRAHYDROCANNABINOL: NOT DETECTED

## 2016-08-01 LAB — AMYLASE, PLEURAL OR PERITONEAL FLUID: Amylase, Fluid: 39 U/L

## 2016-08-01 LAB — GLUCOSE, PLEURAL OR PERITONEAL FLUID: GLUCOSE FL: 72 mg/dL

## 2016-08-01 LAB — PROTEIN, PLEURAL OR PERITONEAL FLUID

## 2016-08-01 LAB — LACTATE DEHYDROGENASE, PLEURAL OR PERITONEAL FLUID: LD, Fluid: 72 U/L — ABNORMAL HIGH (ref 3–23)

## 2016-08-01 LAB — GLUCOSE, CAPILLARY: Glucose-Capillary: 72 mg/dL (ref 65–99)

## 2016-08-01 LAB — ALBUMIN, PLEURAL OR PERITONEAL FLUID: Albumin, Fluid: <1 g/dL

## 2016-08-01 NOTE — Progress Notes (Signed)
Thoracentesis scheduled since yesterday. Spoke w/ IR. Per IR, procedure will be done w/in the next hour.

## 2016-08-01 NOTE — Procedures (Signed)
PROCEDURE SUMMARY:  Successful US guided right thoracentesis. Yielded 1.5 L of hazy yellow fluid. Pt tolerated procedure well. No immediate complications.  Specimen was sent for labs. CXR ordered.  Brayton ElBRUNING, Robyn Galati PA-C 08/01/2016 2:48 PM

## 2016-08-01 NOTE — Evaluation (Signed)
Physical Therapy Evaluation Patient Details Name: Johnathan HolesChristopher [name] MRN: [id number] DOB: August 02, 1970 Today's Date: 08/01/2016   History of Present Illness  Pt admitted 7/13 with sepsis, MSSA bacteremia, B LE celllulitis, tricuspid valve vegetation, R LL cavity PNA with pleural effusion, hep C. Developed encephalopathy and agitation and transferred to ICU 7/17. + DVT LUE. PMH: polysubstance abuse.  Clinical Impression  Patient presents with lethargy, impaired attention, truncal ataxia, decreased ability to follow commands and decreased mobility s/p above. Tolerated sitting EOB ~6 mins with support due to pushing posteriorly without warning and ataxia. Pt non verbal except for 1 "huh" and followed 1 command during session. Not sure of pt's PLOF as pt not able to provide this information at this time. Will follow acutely to further assess functional mobility and determine safe disposition.     Follow Up Recommendations Other (comment) (TBA pending progress/participation)    Equipment Recommendations  Other (comment) (TBA)    Recommendations for Other Services       Precautions / Restrictions Precautions Precautions: Fall Precaution Comments: watch BP Restrictions Weight Bearing Restrictions: No      Mobility  Bed Mobility Overal bed mobility: Needs Assistance Bed Mobility: Supine to Sit;Sit to Supine     Supine to sit: +2 for physical assistance;Mod assist Sit to supine: +2 for physical assistance;Total assist   General bed mobility comments: assisted LEs off EOB, then pt began initiating sitting up, pulling on rail, assisted to raise trunk, pt requiring assist of trunk and LEs back into bed.   Transfers                 General transfer comment: NT due to lethargy  Ambulation/Gait                Stairs            Wheelchair Mobility    Modified Rankin (Stroke Patients Only)       Balance Overall balance assessment: Needs  assistance Sitting-balance support: Feet supported;Bilateral upper extremity supported Sitting balance-Leahy Scale: Poor Sitting balance - Comments: pt intermittently pushing posterior without warning; truncal ataxia present. Sat EOb ~6 mins Postural control: Posterior lean                                   Pertinent Vitals/Pain Pain Assessment: Faces Faces Pain Scale: No hurt    Home Living Family/patient expects to be discharged to:: Private residence                 Additional Comments: Pt unable to provide PLOF.    Prior Function Level of Independence: Independent         Comments: assume pt was independent     Hand Dominance   Dominant Hand: Right    Extremity/Trunk Assessment   Upper Extremity Assessment Upper Extremity Assessment: Defer to OT evaluation    Lower Extremity Assessment Lower Extremity Assessment: Difficult to assess due to impaired cognition (Did not follow commands for testing due to impaired cognition, good tone BLEs noted. )    Cervical / Trunk Assessment Cervical / Trunk Assessment: Other exceptions Cervical / Trunk Exceptions: truncal weakness with rounded shoulders and forward/flexed head  Communication   Communication: Expressive difficulties (no verbalizations, stated "huh" once)  Cognition Arousal/Alertness: Lethargic Behavior During Therapy: Flat affect Overall Cognitive Status: Impaired/Different from baseline Area of Impairment: Attention;Following commands  Current Attention Level: Focused   Following Commands:  (followed command x 1--squeeze hands)       General Comments: followed 1 command, "squeeze hand" but no others. Eyes remained closed for most of session.      General Comments General comments (skin integrity, edema, etc.): VSS.    Exercises     Assessment/Plan    PT Assessment Patient needs continued PT services  PT Problem List Decreased strength;Decreased  mobility;Decreased safety awareness;Decreased cognition;Decreased activity tolerance       PT Treatment Interventions Therapeutic activities;Gait training;Therapeutic exercise;Patient/family education;Balance training;Functional mobility training;Stair training;Neuromuscular re-education;Cognitive remediation    PT Goals (Current goals can be found in the Care Plan section)  Acute Rehab PT Goals Patient Stated Goal: none stated as pt unable/no verbalizations PT Goal Formulation: Patient unable to participate in goal setting Time For Goal Achievement: 08/15/16 Potential to Achieve Goals: Fair    Frequency Min 3X/week   Barriers to discharge   not sure    Co-evaluation PT/OT/SLP Co-Evaluation/Treatment: Yes Reason for Co-Treatment: Complexity of the patient's impairments (multi-system involvement);For patient/therapist safety;Necessary to address cognition/behavior during functional activity PT goals addressed during session: Mobility/safety with mobility;Balance OT goals addressed during session: Strengthening/ROM       AM-PAC PT "6 Clicks" Daily Activity  Outcome Measure Difficulty turning over in bed (including adjusting bedclothes, sheets and blankets)?: Total Difficulty moving from lying on back to sitting on the side of the bed? : Total Difficulty sitting down on and standing up from a chair with arms (e.g., wheelchair, bedside commode, etc,.)?: Total Help needed moving to and from a bed to chair (including a wheelchair)?: A Lot Help needed walking in hospital room?: A Lot Help needed climbing 3-5 steps with a railing? : A Lot 6 Click Score: 9    End of Session   Activity Tolerance: Patient limited by lethargy Patient left: in bed;with call bell/phone within reach Nurse Communication: Mobility status PT Visit Diagnosis: Muscle weakness (generalized) (M62.81);Other symptoms and signs involving the nervous system (W09.811(R29.898)    Time: [id number] PT Time Calculation (min)  (ACUTE ONLY): 13 min   Charges:   PT Evaluation $PT Eval Moderate Complexity: 1 Procedure     PT G Codes:        Johnathan [name], PT, DPT [phone]    Johnathan [name] 08/01/2016, 12:16 PM

## 2016-08-01 NOTE — Progress Notes (Signed)
ANTICOAGULATION CONSULT NOTE - Follow Up Consult  Pharmacy Consult for heparin Indication: DVT  Labs:  Recent Labs  07/30/16 1926 07/30/16 2124 07/31/16 0531 07/31/16 0701 07/31/16 1532 07/31/16 2319  HGB 13.0  --  9.7* 9.9*  --   --   HCT 39.3  --  29.9* 30.5*  --   --   PLT 336  --  291 292  --   --   HEPARINUNFRC 0.29*  --  0.17*  --  0.23* 0.35  CREATININE  --  1.08 1.18  --   --   --     Assessment: 46yo male now therapeutic on heparin though at low end of goal range and would prefer higher w/ DVT.  Goal of Therapy:  Heparin level 0.3-0.7 units/ml   Plan:  Will increase heparin gtt slightly to 1700 units/hr and check level in 6hr.  Vernard GamblesVeronda Jaycen Vercher, PharmD, BCPS  08/01/2016,1:03 AM

## 2016-08-01 NOTE — Progress Notes (Signed)
Spoke w/ IR as pt does not have consent orders for thoracentesis. Relayed that pt is unable to sign own consent.  Per IR, will relay this to PA who will contact pts parents.

## 2016-08-01 NOTE — Progress Notes (Signed)
ANTICOAGULATION CONSULT NOTE  Pharmacy Consult for heparin Indication: DVT  No Known Allergies  Patient Measurements: Height: 5\' 11"  (180.3 cm) Weight: 177 lb (80.3 kg) IBW/kg (Calculated) : 75.3 Heparin Dosing Weight: 76.7kg  Vital Signs: Temp: 98.6 F (37 C) (07/26 0725) Temp Source: Axillary (07/26 0725) BP: 168/104 (07/26 0800) Pulse Rate: 113 (07/26 0800)  Labs:  Recent Labs  07/30/16 2124 07/31/16 0531 07/31/16 0701 07/31/16 1532 07/31/16 2319 08/01/16 0337 08/01/16 0715  HGB  --  9.7* 9.9*  --   --  9.9*  --   HCT  --  29.9* 30.5*  --   --  30.8*  --   PLT  --  291 292  --   --  312  --   HEPARINUNFRC  --  0.17*  --  0.23* 0.35  --  0.54  CREATININE 1.08 1.18  --   --   --  1.35*  --     Estimated Creatinine Clearance: 73.6 mL/min (A) (by C-G formula based on SCr of 1.35 mg/dL (H)).   Assessment: 45 YOM not on anticoagulation PTA who is being treated for endocarditis. Now with small segment of acute DVT in radial veins of left forearm.  heparin 1700 units/hr with HL 0.54  Renal: SCr 1.35  Heme/Onc: H&H 9.9/30.8, plt 312  Goal of Therapy:  Heparin level 0.3-0.7 units/ml Monitor platelets by anticoagulation protocol: Yes   Plan:  Continue heparin 1700 units/hr Daily HL, CBC F/U thoracentesis plans  Isaac BlissMichael Kenedi Cilia, PharmD, BCPS, BCCCP Clinical Pharmacist Clinical phone for 08/01/2016 from 7a-3:30p: [phone] If after 3:30p, please call main pharmacy at: [phone] 08/01/2016 8:33 AM

## 2016-08-01 NOTE — Progress Notes (Signed)
Messaged attending MD: "Pt choking on any attempted liquids. CBG 72. Do we want ST eval?"

## 2016-08-01 NOTE — Progress Notes (Signed)
PROGRESS NOTE    Johnathan [name]  JSH:[id number] DOB: 09-05-1970 DOA: 07/19/2016 PCP: Johnathan Spanish, MD   Chief Complaint  Patient presents with  . Cellulitis     Brief Narrative:  HPI On 07/19/2016 by Dr. Jani [name] Johnathan [name] a 46 y.o.male,w Hep C?, IVDA, (recently injected opana) apparently c/o fever intermittently for the past 1 week, and redness over the dorsum of the right foot and slight swelling. Pt notes injecting Opana about 2 days ago. Pt denies any recent dental work. Pt presented to ED due to subjective fevers.  In ED, Pt found to have mild cellulitis of the right >left foot as well as uti wbc 6-30. Pt noted to be in ARF bun/creat 26/3.17, And to have abnormal LFT, Ast 47, Alt 25, Alk phos 206, T. Bili 1.6, Alb 2.4. And hyponatremia Na 123 , CXR right lung base slightly more patchy may reflect aspiration or pneumonia. Possible small right pleural effusion Pt will be admitted for sepsis secondary to uti vs cellulitis and ARF and hyponatremia. (note LP pending due to ? Neck stiffness)  Interim history Since his admission he has been found to have MSSA bacteremia, possible meningitis, and LE cellulitis. TTE showed EF 85-02%, grade 1 diastolic dysfunction, and tricuspid valve with a mobile vegetation. On 7/17 he developed acute encephalopathy with agitation, becoming belligerent and combative. He was placed on CIWA protocol, however patient was not responsive to ativan and required transfer to the ICU for a precedex gtt.  Assessment & Plan   RLL Cavitary pneumonia with pleural effusion -Septic emboli versus aspiration -Noted to have increased work of breathing yesterday and needing oxygen -PCCM consulted and following -Continue supplemental oxygen to maintain saturations above 92% -Interventional radiology consulted and appreciated, pending right thoracentesis. Labs have been ordered  Sepsis secondary to MSSA bacteremia/endocarditis of the  tricuspid valve/Pseudomonas bacteremia -Patient did have positive blood cultures for Pseudomonas as well as MSSA -Infectious disease consulted and appreciated -Continue cefepime -Patient status post echocardiogram as well as TEE showing tricuspid valve endocarditis, EF of 77-41%, grade 1 diastolic dysfunction -Given patient's IV drug abuse, and history of injection, patient will need SNF versus prolonged hospital stay for antibiotics -Case management social work consulted  Acute encephalopathy/withdrawal -Currently on Haldol for agitation -Patient did require ICU/Precedex drip for agitation and withdrawal -Currently on Haldol for agitation  Acute kidney injury -Resolved -Secondary to sepsis -Renal ultrasound showed mild right pelvicaliectasis with ureteral jet noted and urinary bladder excluding significant ureteral obstruction -Creatinine on admission 3.17 currently 2.87  Illicit drug use/polysubstance abuse -Discussed cessation -Currently on Suboxone  Acute LUE DVT -Started on heparin drip -Patient will need oral anticoagulation unable to tolerate  Hepatitis C -Will need outpatient follow-up to determine if patient is a candidate for therapy -LFTs appear to be stable  Moderate malnutrition/hypoalbuminemia -Nutrition consulted, continue supplements  Abnormal thyroid testing -Given clinical illness, would not start treatment at this time -TSH is 0.208, FT4 2.51 -Would repeat thyroid testing upon discharge  Cellulitis of the right lower extremity -Resolved  Normocytic anemia -Currently on heparin -Hemoglobin appears stable over the past several days, currently 9.9 -hemoglobin was 11 at the beginning of his admission, suspect drop in hemoglobin due to dilutional component as patient received IVF for sepsis and AKI -Continue to monitor CBC  DVT Prophylaxis  heparin  Code Status: Full  Family Communication: None at bedside  Disposition Plan: To be determined.  Currently in stepdown. Pending thoracentesis   Consultants Infectious disease PCCM Cardiology Interventional  radiology  Procedures  Lumbar puncture Renal US Echocardiogram TEE Lower extremity Doppler Left upper extremity Doppler  Antibiotics   Anti-infectives    Start     Dose/Rate Route Frequency Ordered Stop   07/22/16 1400  ceFEPIme (MAXIPIME) 2 g in dextrose 5 % 50 mL IVPB     2 g 100 mL/hr over 30 Minutes Intravenous Every 8 hours 07/22/16 1122 08/31/16 2359   07/20/16 1500  ceFEPIme (MAXIPIME) 2 g in dextrose 5 % 50 mL IVPB  Status:  Discontinued     2 g 100 mL/hr over 30 Minutes Intravenous Every 12 hours 07/20/16 1410 07/22/16 1122   07/19/16 2300  vancomycin (VANCOCIN) IVPB 750 mg/150 ml premix  Status:  Discontinued     750 mg 150 mL/hr over 60 Minutes Intravenous Every 24 hours 07/19/16 0237 07/19/16 0953   07/19/16 2200  ceFEPIme (MAXIPIME) 2 g in dextrose 5 % 50 mL IVPB  Status:  Discontinued     2 g 100 mL/hr over 30 Minutes Intravenous Every 24 hours 07/19/16 1931 07/20/16 1410   07/19/16 2000  nafcillin 2 g in dextrose 5 % 100 mL IVPB  Status:  Discontinued     2 g 200 mL/hr over 30 Minutes Intravenous Every 4 hours 07/19/16 1915 07/22/16 1130   07/19/16 1915  nafcillin injection 2 g  Status:  Discontinued     2 g Intravenous Every 4 hours 07/19/16 1912 07/19/16 1915   07/19/16 1800  ceFEPIme (MAXIPIME) 2 g in dextrose 5 % 50 mL IVPB  Status:  Discontinued     2 g 100 mL/hr over 30 Minutes Intravenous Every 24 hours 07/19/16 1646 07/19/16 1912   07/19/16 1100  vancomycin (VANCOCIN) 500 mg in sodium chloride 0.9 % 100 mL IVPB  Status:  Discontinued     500 mg 100 mL/hr over 60 Minutes Intravenous Every 12 hours 07/19/16 0953 07/19/16 1645   07/19/16 0800  piperacillin-tazobactam (ZOSYN) IVPB 3.375 g  Status:  Discontinued     3.375 g 12.5 mL/hr over 240 Minutes Intravenous Every 8 hours 07/19/16 0239 07/19/16 1645   07/19/16 0200  cefTRIAXone (ROCEPHIN) 2 g  in dextrose 5 % 50 mL IVPB     2 g 100 mL/hr over 30 Minutes Intravenous  Once 07/19/16 0140 07/19/16 0259   07/19/16 0130  cefTRIAXone (ROCEPHIN) 1 g in dextrose 5 % 50 mL IVPB  Status:  Discontinued     1 g 100 mL/hr over 30 Minutes Intravenous  Once 07/19/16 0125 07/19/16 0140   07/19/16 0115  piperacillin-tazobactam (ZOSYN) IVPB 3.375 g     3.375 g 100 mL/hr over 30 Minutes Intravenous  Once 07/19/16 0109 07/19/16 0219   07/19/16 0115  vancomycin (VANCOCIN) IVPB 1000 mg/200 mL premix     1,000 mg 200 mL/hr over 60 Minutes Intravenous  Once 07/19/16 0109 07/19/16 0243      Subjective:   Marius Ditch seen and examined today.  Not very interactive today. Does not respond to questions.   Objective:   Vitals:   08/01/16 0055 08/01/16 0400 08/01/16 0723 08/01/16 0725  BP:  (!) 145/87 (!) 168/99 (!) 168/99  Pulse: 97 93  (!) 109  Resp: 14 13  (!) 28  Temp: 99 F (37.2 C) 99.5 F (37.5 C) 98.6 F (37 C) 98.6 F (37 C)  TempSrc: Axillary Oral Oral Axillary  SpO2: 96% 97%  94%  Weight:  80.3 kg (177 lb)    Height:  '5\' 11"'  (1.803 m)  Intake/Output Summary (Last 24 hours) at 08/01/16 0742 Last data filed at 08/01/16 0400  Gross per 24 hour  Intake           231.62 ml  Output              455 ml  Net          -223.38 ml   Filed Weights   07/30/16 2000 07/31/16 0500 08/01/16 0400  Weight: 75.4 kg (166 lb 3.6 oz) 75.4 kg (166 lb 3.6 oz) 80.3 kg (177 lb)   Exam  General: Well developed, ill-appearing, NAD  HEENT: NCAT, mucous membranes dry  Cardiovascular: S1 S2 auscultated, tachycardic, +SEM  Respiratory: Decreased breath sounds, +cough, +rhonci   Abdomen: Soft, nontender, nondistended, + bowel sounds  Extremities: warm dry without cyanosis clubbing or edema  Neuro: Cannot fully assess. Moving all extremities with ease  Data Reviewed: I have personally reviewed following labs and imaging studies  CBC:  Recent Labs Lab 07/28/16 0428  07/30/16 1926 07/31/16 0531 07/31/16 0701 08/01/16 0337  WBC 5.7 16.6* 8.0 8.0 9.4  HGB 9.1* 13.0 9.7* 9.9* 9.9*  HCT 27.8* 39.3 29.9* 30.5* 30.8*  MCV 83.7 86.6 86.4 87.6 87.5  PLT 248 336 291 292 831   Basic Metabolic Panel:  Recent Labs Lab 07/27/16 0256 07/28/16 0428 07/30/16 2124 07/31/16 0531 08/01/16 0337  NA 143 143 137 140 140  K 3.6 4.1 4.8 4.6 4.9  CL 118* 118* 115* 117* 119*  CO2 17* 19* 18* 16* 14*  GLUCOSE 87 86 92 84 73  BUN 27* 26* 38* 40* 44*  CREATININE 0.85 0.92 1.08 1.18 1.35*  CALCIUM 8.0* 8.1* 8.5* 8.5* 8.8*  MG 2.0  --   --   --   --   PHOS 4.6  --   --   --   --    GFR: Estimated Creatinine Clearance: 73.6 mL/min (A) (by C-G formula based on SCr of 1.35 mg/dL (H)). Liver Function Tests:  Recent Labs Lab 07/28/16 0428 07/30/16 2124  AST 22 21  ALT 16* 13*  ALKPHOS 107 98  BILITOT 1.4* 1.0  PROT 6.6 6.8  ALBUMIN 1.4* 1.4*   No results for input(s): LIPASE, AMYLASE in the last 168 hours. No results for input(s): AMMONIA in the last 168 hours. Coagulation Profile: No results for input(s): INR, PROTIME in the last 168 hours. Cardiac Enzymes: No results for input(s): CKTOTAL, CKMB, CKMBINDEX, TROPONINI in the last 168 hours. BNP (last 3 results) No results for input(s): PROBNP in the last 8760 hours. HbA1C: No results for input(s): HGBA1C in the last 72 hours. CBG:  Recent Labs Lab 07/29/16 1114 07/29/16 1526 07/30/16 0736 07/30/16 1207 07/30/16 1712  GLUCAP 83 89 97 90 88   Lipid Profile: No results for input(s): CHOL, HDL, LDLCALC, TRIG, CHOLHDL, LDLDIRECT in the last 72 hours. Thyroid Function Tests: No results for input(s): TSH, T4TOTAL, FREET4, T3FREE, THYROIDAB in the last 72 hours. Anemia Panel: No results for input(s): VITAMINB12, FOLATE, FERRITIN, TIBC, IRON, RETICCTPCT in the last 72 hours. Urine analysis:    Component Value Date/Time   COLORURINE AMBER (A) 07/28/2016 1622   APPEARANCEUR TURBID (A) 07/28/2016  1622   LABSPEC 1.034 (H) 07/28/2016 1622   PHURINE 5.0 07/28/2016 1622   GLUCOSEU 50 (A) 07/28/2016 1622   HGBUR LARGE (A) 07/28/2016 1622   BILIRUBINUR SMALL (A) 07/28/2016 1622   KETONESUR 5 (A) 07/28/2016 1622   PROTEINUR >=300 (A) 07/28/2016 1622   NITRITE NEGATIVE 07/28/2016  [place] 07/28/2016 1622   Sepsis Labs: '@LABRCNTIP' (procalcitonin:4,lacticidven:4)  ) Recent Results (from the past 240 hour(s))  MRSA PCR Screening     Status: None   Collection Time: 07/23/16 10:30 AM  Result Value Ref Range Status   MRSA by PCR NEGATIVE NEGATIVE Final    Comment:        The GeneXpert MRSA Assay (FDA approved for NASAL specimens only), is one component of a comprehensive MRSA colonization surveillance program. It is not intended to diagnose MRSA infection nor to guide or monitor treatment for MRSA infections.   Urine culture     Status: None   Collection Time: 07/23/16  4:41 PM  Result Value Ref Range Status   Specimen Description URINE, CATHETERIZED  Final   Special Requests NONE  Final   Culture NO GROWTH  Final   Report Status 07/24/2016 FINAL  Final      Radiology Studies: Ct Head Wo Contrast  Result Date: 07/30/2016 CLINICAL DATA:  Mental status change.  Pulmonary embolism. EXAM: CT HEAD WITHOUT CONTRAST TECHNIQUE: Contiguous axial images were obtained from the base of the skull through the vertex without intravenous contrast. COMPARISON:  CT head without contrast 07/22/2016 FINDINGS: Brain: Mild generalized atrophy is again noted no acute infarct, hemorrhage, or mass lesion is present. There is no significant white matter disease. The ventricles are of normal size. No significant extraaxial fluid collection is present. The Vascular: No hyperdense vessel or unexpected calcification. Skull: The calvarium is intact. No focal lytic or blastic lesions are present. Sinuses/Orbits: The paranasal sinuses and mastoid air cells are clear. The globes and orbits are  within normal limits bilaterally. IMPRESSION: 1. Stable mild generalized atrophy. 2. No acute intracranial abnormality. Electronically Signed   By: San Morelle M.D.   On: 07/30/2016 16:40   Ct Angio Chest Pe W Or Wo Contrast  Result Date: 07/30/2016 CLINICAL DATA:  46 year old with mental status change. Bacteremia. Preliminary results of a left upper extremity venous duplex demonstrated small thrombus in one of the radial veins. EXAM: CT ANGIOGRAPHY CHEST WITH CONTRAST TECHNIQUE: Multidetector CT imaging of the chest was performed using the standard protocol during bolus administration of intravenous contrast. Multiplanar CT image reconstructions and MIPs were obtained to evaluate the vascular anatomy. CONTRAST:  100 mL Isovue 370 COMPARISON:  Chest radiograph 07/28/2016 and chest CT 11/06/2003 FINDINGS: Cardiovascular: Negative for pulmonary embolism. Pulmonary arteries are well opacified on this examination. 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 Aorta measured 3.7 cm in 2005. Limited evaluation for dissection on this examination but no suspicious abnormalities in thoracic aorta other than the aneurysm. Heart size is within normal limits. No pericardial fluid. Mediastinum/Nodes: No significant chest lymphadenopathy but limited evaluation due to the extensive pleuroparenchymal lung disease. Lungs/Pleura: Large right pleural effusion and moderate-sized left pleural effusion. Severe emphysematous disease in the lungs. There is a combination of centrilobular and paraseptal emphysema. Near complete collapse and consolidation of the right lower lobe related to the right pleural effusion. There is a prominent 1.1 cm nodule along the right minor fissure on sequence 6, image 97. There is focal consolidation along the posterior aspect of the right minor fissure which may be involving both the right upper lobe and the right middle lobe. This area roughly measures up to 3.4 cm on the  soft tissue windows on sequence 5, image 90. This could represent a cavitary lesion with adjacent pleural thickening. Volume loss in the posterior right upper  lobe related to the large pleural effusion. There may be necrotic lung tissue in the posterior right upper lung on sequence 6, image 53. Large bleb at the left lung apex. Poorly defined hazy nodular structure in the left upper lobe with a branching configuration on sequence 6, image 57. Pleural-based nodular lesion in the lingula measuring up to 2.3 cm on sequence 6, image 18. Volume loss in the left lower lobe associated with the pleural fluid. Upper Abdomen: Upper abdominal ascites, particularly around the liver. Liver has a nodular contour and compatible with cirrhosis. Spleen is enlarged. Limited evaluation of the portal venous system on this examination. Musculoskeletal: No acute bone abnormality. Review of the MIP images confirms the above findings. IMPRESSION: Negative for a pulmonary embolism. Extensive pleural-parenchymal lung disease bilaterally. Large right pleural effusion and moderate-sized left pleural effusion. Pleural-based nodular opacities could represent an infectious etiology but also raise concern for a neoplastic process. Concern for a cavitary lesion and necrotic tissue in the right lung. Consider sampling of the pleural fluid. Recommend follow-up CT when the pleural fluid and acute process have resolved to exclude neoplastic disease. Severe emphysematous disease. Cirrhosis with evidence for portal hypertension based on the splenomegaly and ascites. 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 Recommend annual imaging followup by CTA or MRA. This recommendation follows 2010 ACCF/AHA/AATS/ACR/ASA/SCA/SCAI/SIR/STS/SVM Guidelines for the Diagnosis and Management of Patients with Thoracic Aortic Disease. Circulation. 2010; 121: W102-V253 Electronically Signed   By: Markus Daft M.D.   On: 07/30/2016 17:05     Scheduled  Meds: . buprenorphine-naloxone  1 tablet Sublingual Daily  . cloNIDine  0.2 mg Transdermal Weekly  . feeding supplement  1 Container Oral TID BM  . folic acid  1 mg Oral Daily  . LORazepam  0-4 mg Intravenous Q6H   Followed by  . LORazepam  0-4 mg Intravenous Q12H  . metoprolol tartrate  5 mg Intravenous Q6H  . sodium chloride flush  3 mL Intravenous Q12H   Continuous Infusions: . ceFEPime (MAXIPIME) IV Stopped (07/31/16 2140)  . heparin 1,700 Units/hr (08/01/16 0130)     LOS: 13 days   Time Spent in minutes   45 minutes  Tricia Oaxaca D.O. on 08/01/2016 at 7:42 AM  Between 7am to 7pm - Pager - [phone]  After 7pm go to [email or web address] - password [email or web address]  And look for the night coverage person covering for me after hours  [facility] Office  [phone]

## 2016-08-01 NOTE — Evaluation (Signed)
Occupational Therapy Evaluation Patient Details Name: Johnathan [name] MRN: [id number] DOB: 03/15/1970 Today's Date: 08/01/2016    History of Present Illness Pt admitted 7/13 with sepsis, MSSA bacteremia, B LE celllulitis, tricuspid valve vegetation, R LL cavity PNA with pleural effusion, hep C. Developed encephalopathy and agitation and transferred to ICU 7/17. + DVT LUE. PMH: polysubstance abuse.   Clinical Impression   Pt presumed to be independent prior to admission, no family available to provide PLOF. Presents with lethargy, maintaining eyes closed most of session. He requires 2 person assist for bed level mobility. He sat at EOB with intermittent pushing posteriorly and poor trunk control. He demonstrates impaired cognition, poor activity tolerance and requires total assist for all ADL. Will follow acutely and determine optimal d/c disposition.     Follow Up Recommendations   (to be determined, depending on progress)    Equipment Recommendations       Recommendations for Other Services       Precautions / Restrictions Precautions Precautions: Fall Precaution Comments: watch BP Restrictions Weight Bearing Restrictions: No      Mobility Bed Mobility Overal bed mobility: Needs Assistance Bed Mobility: Supine to Sit;Sit to Supine     Supine to sit: +2 for physical assistance;Mod assist Sit to supine: +2 for physical assistance;Total assist   General bed mobility comments: assisted LEs off EOB, then pt began initiating sitting up, pulling on rail, assisted to raise trunk, pt requiring assist of trunk and LEs back into bed.   Transfers                 General transfer comment: NT due to lethargy    Balance Overall balance assessment: Needs assistance Sitting-balance support: Feet supported;Bilateral upper extremity supported Sitting balance-Leahy Scale: Poor Sitting balance - Comments: pt intermittently pushing posterior without warning; truncal ataxia  present. Sat EOb ~6 mins Postural control: Posterior lean                                 ADL either performed or assessed with clinical judgement   ADL                                         General ADL Comments: total dependence     Vision   Additional Comments: eyes closed most of session     Perception     Praxis      Pertinent Vitals/Pain Pain Assessment: Faces Faces Pain Scale: No hurt     Hand Dominance Right   Extremity/Trunk Assessment Upper Extremity Assessment Upper Extremity Assessment: Defer to OT evaluation   Lower Extremity Assessment Lower Extremity Assessment: Difficult to assess due to impaired cognition (Did not follow commands for testing due to impaired cognition, good tone BLEs noted. )   Cervical / Trunk Assessment Cervical / Trunk Assessment: Other exceptions Cervical / Trunk Exceptions: truncal weakness with rounded shoulders and forward/flexed head   Communication Communication Communication: Expressive difficulties (no verbalizations, stated "huh" once)   Cognition Arousal/Alertness: Lethargic Behavior During Therapy: Flat affect Overall Cognitive Status: Impaired/Different from baseline Area of Impairment: Attention;Following commands                   Current Attention Level: Focused   Following Commands:  (followed command x 1--squeeze hands)       General Comments: followed 1  command, "squeeze hand" but no others. Eyes remained closed for most of session.   General Comments  VSS.    Exercises     Shoulder Instructions      Home Living Family/patient expects to be discharged to:: Private residence                                 Additional Comments: Pt unable to provide PLOF.      Prior Functioning/Environment Level of Independence: Independent        Comments: assume pt was independent        OT Problem List: Decreased activity tolerance;Decreased  strength;Decreased cognition;Decreased coordination;Impaired balance (sitting and/or standing);Decreased safety awareness;Impaired UE functional use;Cardiopulmonary status limiting activity;Decreased knowledge of use of DME or AE      OT Treatment/Interventions: Self-care/ADL training;DME and/or AE instruction;Patient/family education;Balance training;Cognitive remediation/compensation;Therapeutic activities    OT Goals(Current goals can be found in the care plan section) Acute Rehab OT Goals Patient Stated Goal: none stated as pt unable/no verbalizations OT Goal Formulation: Patient unable to participate in goal setting Time For Goal Achievement: 08/15/16 Potential to Achieve Goals: Fair ADL Goals Pt Will Perform Eating: with mod assist;sitting Pt Will Perform Grooming: with mod assist;sitting Pt Will Transfer to Toilet: with +2 assist;with min assist;stand pivot transfer Additional ADL Goal #1: Pt will sit EOB x 10 minutes with min guard assist. Additional ADL Goal #2: Pt will follow one step commands 50% of time. Additional ADL Goal #3: Pt will demonstrate sustained attention.  OT Frequency: Min 2X/week   Barriers to D/C:            Co-evaluation PT/OT/SLP Co-Evaluation/Treatment: Yes Reason for Co-Treatment: Complexity of the patient's impairments (multi-system involvement);For patient/therapist safety;Necessary to address cognition/behavior during functional activity PT goals addressed during session: Mobility/safety with mobility;Balance OT goals addressed during session: Strengthening/ROM      AM-PAC PT "6 Clicks" Daily Activity     Outcome Measure Help from another person eating meals?: Total Help from another person taking care of personal grooming?: Total Help from another person toileting, which includes using toliet, bedpan, or urinal?: Total Help from another person bathing (including washing, rinsing, drying)?: Total Help from another person to put on and taking off  regular upper body clothing?: Total Help from another person to put on and taking off regular lower body clothing?: Total 6 Click Score: 6   End of Session Nurse Communication: Mobility status  Activity Tolerance: Patient limited by lethargy Patient left: in bed;with call bell/phone within reach;with bed alarm set  OT Visit Diagnosis: Muscle weakness (generalized) (M62.81);Other symptoms and signs involving cognitive function                Time: [id number] OT Time Calculation (min): 13 min Charges:  OT General Charges $OT Visit: 1 Procedure OT Evaluation $OT Eval Moderate Complexity: 1 Procedure G-Codes:      Evern BioMayberry, Generoso Cropper [place] 08/01/2016, 1:41 PM  [phone]

## 2016-08-01 NOTE — Progress Notes (Signed)
Pt still not eating or drinking.  Going on day 3. Messaged attending MD.

## 2016-08-02 DIAGNOSIS — J90 Pleural effusion, not elsewhere classified: Secondary | ICD-10-CM

## 2016-08-02 LAB — BLOOD GAS, ARTERIAL
Acid-base deficit: 11.4 mmol/L — ABNORMAL HIGH (ref 0.0–2.0)
BICARBONATE: 13.9 mmol/L — AB (ref 20.0–28.0)
Drawn by: 26410
FIO2: 21
O2 SAT: 95.6 %
PATIENT TEMPERATURE: 98.6
pCO2 arterial: 30.5 mmHg — ABNORMAL LOW (ref 32.0–48.0)
pH, Arterial: 7.282 — ABNORMAL LOW (ref 7.350–7.450)
pO2, Arterial: 77.8 mmHg — ABNORMAL LOW (ref 83.0–108.0)

## 2016-08-02 LAB — TRIGLYCERIDES, BODY FLUIDS: Triglycerides, Fluid: 20 mg/dL

## 2016-08-02 LAB — CBC
HEMATOCRIT: 31.9 % — AB (ref 39.0–52.0)
HEMOGLOBIN: 10.4 g/dL — AB (ref 13.0–17.0)
MCH: 28.1 pg (ref 26.0–34.0)
MCHC: 32.6 g/dL (ref 30.0–36.0)
MCV: 86.2 fL (ref 78.0–100.0)
Platelets: 316 10*3/uL (ref 150–400)
RBC: 3.7 MIL/uL — AB (ref 4.22–5.81)
RDW: 18.6 % — ABNORMAL HIGH (ref 11.5–15.5)
WBC: 8.7 10*3/uL (ref 4.0–10.5)

## 2016-08-02 LAB — ACID FAST SMEAR (AFB, MYCOBACTERIA): Acid Fast Smear: NEGATIVE

## 2016-08-02 LAB — PH, BODY FLUID: PH, BODY FLUID: 7.1

## 2016-08-02 LAB — BASIC METABOLIC PANEL
Anion gap: 9 (ref 5–15)
BUN: 46 mg/dL — AB (ref 6–20)
CHLORIDE: 119 mmol/L — AB (ref 101–111)
CO2: 14 mmol/L — ABNORMAL LOW (ref 22–32)
CREATININE: 1.46 mg/dL — AB (ref 0.61–1.24)
Calcium: 9.3 mg/dL (ref 8.9–10.3)
GFR calc Af Amer: >60 mL/min (ref >60)
GFR, EST NON AFRICAN AMERICAN: 56 mL/min — AB (ref >60)
Glucose, Bld: 76 mg/dL (ref 65–99)
Potassium: 4.7 mmol/L (ref 3.5–5.1)
SODIUM: 142 mmol/L (ref 135–145)

## 2016-08-02 LAB — HEPARIN LEVEL (UNFRACTIONATED): HEPARIN UNFRACTIONATED: 0.47 [IU]/mL (ref 0.30–0.70)

## 2016-08-02 LAB — GLUCOSE, CAPILLARY: Glucose-Capillary: 91 mg/dL (ref 65–99)

## 2016-08-02 LAB — AMMONIA: AMMONIA: 28 umol/L (ref 9–35)

## 2016-08-02 MED ORDER — SODIUM CHLORIDE 0.9% FLUSH
10.0000 mL | Freq: Two times a day (BID) | INTRAVENOUS | Status: DC
  Administered 2016-08-02 – 2016-08-07 (×11): 10 mL
  Administered 2016-08-07 – 2016-08-08 (×2): 20 mL
  Administered 2016-08-08: 10 mL

## 2016-08-02 MED ORDER — DEXTROSE-NACL 5-0.45 % IV SOLN
INTRAVENOUS | Status: DC
  Administered 2016-08-03 (×3): via INTRAVENOUS
  Administered 2016-08-04: 1000 mL via INTRAVENOUS

## 2016-08-02 MED ORDER — CHLORHEXIDINE GLUCONATE CLOTH 2 % EX PADS
6.0000 | MEDICATED_PAD | Freq: Every day | CUTANEOUS | Status: DC
  Administered 2016-08-03 – 2016-09-01 (×23): 6 via TOPICAL

## 2016-08-02 MED ORDER — BISACODYL 10 MG RE SUPP
10.0000 mg | Freq: Once | RECTAL | Status: AC
  Administered 2016-08-03: 10 mg via RECTAL
  Filled 2016-08-02: qty 1

## 2016-08-02 MED ORDER — LORAZEPAM 2 MG/ML IJ SOLN
0.0000 mg | Freq: Two times a day (BID) | INTRAMUSCULAR | Status: DC | PRN
  Administered 2016-08-03 – 2016-08-05 (×5): 2 mg via INTRAVENOUS
  Filled 2016-08-02 (×6): qty 1

## 2016-08-02 MED ORDER — SODIUM CHLORIDE 0.9% FLUSH
10.0000 mL | INTRAVENOUS | Status: DC | PRN
  Administered 2016-08-09 – 2016-09-01 (×4): 10 mL
  Filled 2016-08-02 (×4): qty 40

## 2016-08-02 NOTE — Progress Notes (Addendum)
Physical Therapy Treatment Patient Details Name: Johnathan [name] MRN: [id number] DOB: Oct 21, 1970 Today's Date: 08/02/2016    History of Present Illness Pt admitted 7/13 with sepsis, MSSA bacteremia, B LE celllulitis, tricuspid valve vegetation, R LL cavity PNA with pleural effusion, hep C. Developed encephalopathy and agitation and transferred to ICU 7/17. + DVT LUE. PMH: polysubstance abuse.    PT Comments    Patient not progressing yet.  Remains unable to participate much with PT at this time.  Will continue skilled PT in the acute setting, but likely to need SNF rehab at d/c unless able to participate more.  Follow Up Recommendations  SNF     Equipment Recommendations  Other (comment) (TBA next level of care)    Recommendations for Other Services       Precautions / Restrictions Precautions Precautions: Fall Precaution Comments: watch BP    Mobility  Bed Mobility Overal bed mobility: Needs Assistance Bed Mobility: Rolling;Sidelying to Sit Rolling: Mod assist Sidelying to sit: Max assist;+2 for physical assistance   Sit to supine: +2 for physical assistance;Total assist   General bed mobility comments: pt assisted to roll, but needed help for legs off bed and to lift trunk; did not assist returning to supine  Transfers                 General transfer comment: NT due to lethargy  Ambulation/Gait                 Stairs            Wheelchair Mobility    Modified Rankin (Stroke Patients Only) Modified Rankin (Stroke Patients Only) Pre-Morbid Rankin Score: No symptoms Modified Rankin: Severe disability     Balance Overall balance assessment: Needs assistance Sitting-balance support: Feet supported;Bilateral upper extremity supported Sitting balance-Leahy Scale: Zero Sitting balance - Comments: needing constant mod to max A due to ataxia, poor awareness of upright orientation/ sat EOB about 5 minutes, pt did extend head neck to  command x 1                                    Cognition Arousal/Alertness: Lethargic Behavior During Therapy: Flat affect Overall Cognitive Status: Difficult to assess Area of Impairment: Attention;Following commands                   Current Attention Level: Focused   Following Commands:  (does not follow commands)       General Comments: moans occasionally, not in response to any question/command in particular      Exercises      General Comments General comments (skin integrity, edema, etc.): pt with elevated RR at times, BP not able to obtain in sitting due to cuff on R calf and pt moving leg too much; returned to supine for safety and bed in lowest position with all 4 rails up       Pertinent Vitals/Pain Faces Pain Scale: No hurt    Home Living                      Prior Function            PT Goals (current goals can now be found in the care plan section) Progress towards PT goals: Not progressing toward goals - comment    Frequency    Min 3X/week      PT Plan Discharge plan  needs to be updated    Co-evaluation              AM-PAC PT "6 Clicks" Daily Activity  Outcome Measure  Difficulty turning over in bed (including adjusting bedclothes, sheets and blankets)?: Total Difficulty moving from lying on back to sitting on the side of the bed? : Total Difficulty sitting down on and standing up from a chair with arms (e.g., wheelchair, bedside commode, etc,.)?: Total Help needed moving to and from a bed to chair (including a wheelchair)?: Total Help needed walking in hospital room?: Total Help needed climbing 3-5 steps with a railing? : Total 6 Click Score: 6    End of Session Equipment Utilized During Treatment: Oxygen Activity Tolerance: Patient limited by lethargy Patient left: in bed;with call bell/phone within reach   PT Visit Diagnosis: Other symptoms and signs involving the nervous system (R29.898);Muscle  weakness (generalized) (M62.81)     Time: [id number] PT Time Calculation (min) (ACUTE ONLY): 21 min  Charges:  $Therapeutic Activity: 8-22 mins                    G CodesSheran Lawless:       Cyndi Wynn, [place] [phone] 08/02/2016    Elray Mcgregorynthia Wynn 08/02/2016, 4:07 PM

## 2016-08-02 NOTE — Progress Notes (Signed)
Peripherally Inserted Central Catheter/Midline Placement  The IV Nurse has discussed with the patient and/or persons authorized to consent for the patient, the purpose of this procedure and the potential benefits and risks involved with this procedure.  The benefits include less needle sticks, lab draws from the catheter, and the patient may be discharged home with the catheter. Risks include, but not limited to, infection, bleeding, blood clot (thrombus formation), and puncture of an artery; nerve damage and irregular heartbeat and possibility to perform a PICC exchange if needed/ordered by physician.  Alternatives to this procedure were also discussed.  Bard Power PICC patient education guide, fact sheet on infection prevention and patient information card has been provided to patient /or left at bedside.    PICC/Midline Placement Documentation  PICC Double Lumen 08/02/16 PICC Right Brachial 40 cm 1 cm (Active)  Indication for Insertion or Continuance of Line Prolonged intravenous therapies 08/02/2016 10:00 AM  Exposed Catheter (cm) 1 cm 08/02/2016 10:00 AM  Site Assessment Clean;Dry;Intact 08/02/2016 10:00 AM  Lumen #1 Status Flushed;Saline locked;Blood return noted 08/02/2016 10:00 AM  Lumen #2 Status Flushed;Saline locked;Blood return noted 08/02/2016 10:00 AM  Dressing Type Transparent;Securing device 08/02/2016 10:00 AM  Dressing Status Clean;Dry;Intact;Antimicrobial disc in place 08/02/2016 10:00 AM  Dressing Change Due 08/09/16 08/02/2016 10:00 AM       Johnathan [name], Johnathan [name] 08/02/2016, 10:05 AM

## 2016-08-02 NOTE — Progress Notes (Signed)
SLP Cancellation Note  Patient Details Name: Johnathan HolesChristopher XXXPallas MRN: [id number] DOB: 1970/04/29   Cancelled treatment:   Orders received for bedside swallow eval - RN asked to hold on swallow evaluation due to respiratory issues.  Will f/u next date.         Johnathan [name], Johnathan [name] 08/02/2016, 10:31 AM

## 2016-08-02 NOTE — Progress Notes (Signed)
PROGRESS NOTE    Johnathan [name]  BUL:[id number] DOB: 1970/02/05 DOA: 07/19/2016 PCP: [name], MD   Chief Complaint  Patient presents with  . Cellulitis     Brief Narrative:  HPI On 07/19/2016 by Dr. [name] [name] a 46 y.o.male,w Hep C?, IVDA, (recently injected opana) apparently c/o fever intermittently for the past 1 week, and redness over the dorsum of the right foot and slight swelling. Pt notes injecting Opana about 2 days ago. Pt denies any recent dental work. Pt presented to ED due to subjective fevers.  In ED, Pt found to have mild cellulitis of the right >left foot as well as uti wbc 6-30. Pt noted to be in ARF bun/creat 26/3.17, And to have abnormal LFT, Ast 47, Alt 25, Alk phos 206, T. Bili 1.6, Alb 2.4. And hyponatremia Na 123 , CXR right lung base slightly more patchy may reflect aspiration or pneumonia. Possible small right pleural effusion Pt will be admitted for sepsis secondary to uti vs cellulitis and ARF and hyponatremia. (note LP pending due to ? Neck stiffness)  Interim history Since his admission he has been found to have MSSA bacteremia, possible meningitis, and LE cellulitis. TTE showed EF 32-12%, grade 1 diastolic dysfunction, and tricuspid valve with a mobile vegetation. On 7/17 he developed acute encephalopathy with agitation, becoming belligerent and combative. He was placed on CIWA protocol, however patient was not responsive to ativan and required transfer to the ICU for a precedex gtt.  Assessment & Plan   RLL Cavitary pneumonia with pleural effusion -Septic emboli versus aspiration -Noted to have increased work of breathing yesterday and needing oxygen -PCCM consulted and following -Continue supplemental oxygen to maintain saturations above 92% -Interventional radiology consulted and appreciated, s/p Right thoracentesis which yielded 1.5L of yellow fluid. -Fluid culture pending. Gram stain shows no  organisms  Sepsis secondary to MSSA bacteremia/endocarditis of the tricuspid valve/Pseudomonas bacteremia -Patient did have positive blood cultures for Pseudomonas as well as MSSA -Infectious disease consulted and appreciated -Continue cefepime -Patient status post echocardiogram as well as TEE showing tricuspid valve endocarditis, EF of 24-82%, grade 1 diastolic dysfunction -Given patient's IV drug abuse, and history of injection, patient will need SNF versus prolonged hospital stay for antibiotics -Case management social work consulted -PICC line ordered, however if patient's mental status improves, and he opts to leave AMA, PICC should be REMOVED before he leaves given his history of IVDA  Acute encephalopathy/withdrawal -Patient did require ICU/Precedex drip for agitation and withdrawal -Currently patient very lethargic -Will change scheduled ativan to PRN. Continue haldol PRN for agitation  -CT head obtained which was unremarkable   Acute kidney injury -Resolved -Secondary to sepsis -Renal ultrasound showed mild right pelvicaliectasis with ureteral jet noted and urinary bladder excluding significant ureteral obstruction -Creatinine on admission 3.17 currently 1.46 -Given slight increase in creatinine today, will start on gentle IVF with D5 1/2NS as patient has had poor oral intake.  -Continue to monitor BMP  Illicit drug use/polysubstance abuse -Discussed cessation -Currently on Suboxone  Acute LUE DVT -Started on heparin drip -Patient will need oral anticoagulation unable to tolerate  Hepatitis C -Will need outpatient follow-up to determine if patient is a candidate for therapy -LFTs appear to be stable  Moderate malnutrition/hypoalbuminemia -Nutrition consulted, continue supplements- however patient currently has poor oral intake over the past several days -Will start on D51/2 NS at 50cc per hour given his volume status -Speech therapy consulted as patient was choking  with fluids on 08/01/16  Abnormal thyroid testing -Given clinical illness, would not start treatment at this time -TSH is 0.208, FT4 2.51 -Would repeat thyroid testing upon discharge  Cellulitis of the right lower extremity -Resolved  Normocytic anemia -Currently on heparin -Hemoglobin appears stable over the past several days, currently 10.4 -hemoglobin was 11 at the beginning of his admission, suspect drop in hemoglobin due to dilutional component as patient received IVF for sepsis and AKI -Continue to monitor CBC  DVT Prophylaxis  heparin  Code Status: Full  Family Communication: None at bedside. Discussed with mother and father separately via phone  Disposition Plan: To be determined. Currently in stepdown. Pending improvement in mental status. Will likely need to continue admission until antibiotics are completed  Consultants Infectious disease PCCM Cardiology Interventional radiology  Procedures  Lumbar puncture Renal US Echocardiogram TEE Lower extremity Doppler Left upper extremity Doppler US Guided Right thoracentesis  PICC line placement   Antibiotics   Anti-infectives    Start     Dose/Rate Route Frequency Ordered Stop   07/22/16 1400  ceFEPIme (MAXIPIME) 2 g in dextrose 5 % 50 mL IVPB     2 g 100 mL/hr over 30 Minutes Intravenous Every 8 hours 07/22/16 1122 09/01/16 0259   07/20/16 1500  ceFEPIme (MAXIPIME) 2 g in dextrose 5 % 50 mL IVPB  Status:  Discontinued     2 g 100 mL/hr over 30 Minutes Intravenous Every 12 hours 07/20/16 1410 07/22/16 1122   07/19/16 2300  vancomycin (VANCOCIN) IVPB 750 mg/150 ml premix  Status:  Discontinued     750 mg 150 mL/hr over 60 Minutes Intravenous Every 24 hours 07/19/16 0237 07/19/16 0953   07/19/16 2200  ceFEPIme (MAXIPIME) 2 g in dextrose 5 % 50 mL IVPB  Status:  Discontinued     2 g 100 mL/hr over 30 Minutes Intravenous Every 24 hours 07/19/16 1931 07/20/16 1410   07/19/16 2000  nafcillin 2 g in dextrose 5 % 100  mL IVPB  Status:  Discontinued     2 g 200 mL/hr over 30 Minutes Intravenous Every 4 hours 07/19/16 1915 07/22/16 1130   07/19/16 1915  nafcillin injection 2 g  Status:  Discontinued     2 g Intravenous Every 4 hours 07/19/16 1912 07/19/16 1915   07/19/16 1800  ceFEPIme (MAXIPIME) 2 g in dextrose 5 % 50 mL IVPB  Status:  Discontinued     2 g 100 mL/hr over 30 Minutes Intravenous Every 24 hours 07/19/16 1646 07/19/16 1912   07/19/16 1100  vancomycin (VANCOCIN) 500 mg in sodium chloride 0.9 % 100 mL IVPB  Status:  Discontinued     500 mg 100 mL/hr over 60 Minutes Intravenous Every 12 hours 07/19/16 0953 07/19/16 1645   07/19/16 0800  piperacillin-tazobactam (ZOSYN) IVPB 3.375 g  Status:  Discontinued     3.375 g 12.5 mL/hr over 240 Minutes Intravenous Every 8 hours 07/19/16 0239 07/19/16 1645   07/19/16 0200  cefTRIAXone (ROCEPHIN) 2 g in dextrose 5 % 50 mL IVPB     2 g 100 mL/hr over 30 Minutes Intravenous  Once 07/19/16 0140 07/19/16 0259   07/19/16 0130  cefTRIAXone (ROCEPHIN) 1 g in dextrose 5 % 50 mL IVPB  Status:  Discontinued     1 g 100 mL/hr over 30 Minutes Intravenous  Once 07/19/16 0125 07/19/16 0140   07/19/16 0115  piperacillin-tazobactam (ZOSYN) IVPB 3.375 g     3.375 g 100 mL/hr over 30 Minutes Intravenous  Once 07/19/16 0109 07/19/16  0350   07/19/16 0115  vancomycin (VANCOCIN) IVPB 1000 mg/200 mL premix     1,000 mg 200 mL/hr over 60 Minutes Intravenous  Once 07/19/16 0109 07/19/16 0243      Subjective:   Johnathan [name] seen and examined today.  Still not very interactive today. Opens eyes with moans. Does not respond to questions or follow commands.  Objective:   Vitals:   08/02/16 0400 08/02/16 0500 08/02/16 0700 08/02/16 0727  BP: (!) 147/100 (!) 161/86 (!) 138/106 (!) 138/106  Pulse: (!) 104 (!) 101 93 71  Resp: _0 Temp:    98.2 F (36.8 C)  TempSrc:    Oral  SpO2: 97% 98% 98% 99%  Weight:      Height:        Intake/Output Summary  (Last 24 hours) at 08/02/16 1010 Last data filed at 08/02/16 0400  Gross per 24 hour  Intake              490 ml  Output              350 ml  Net              140 ml   Filed Weights   07/31/16 0500 08/01/16 0400 08/02/16 0300  Weight: 75.4 kg (166 lb 3.6 oz) 80.3 kg (177 lb) 77.1 kg (170 lb)   Exam  General: Well developed, Appearing, no apparent distress  HEENT: NCAT, mcous membranes moist.   Cardiovascular: S1 S2 auscultated, 1/6SEM, RRR  Respiratory: Diminished breath sounds, normal respiratory effort  Abdomen: Soft, nontender, nondistended, + bowel sounds  Extremities: warm dry without cyanosis clubbing. +2edema in lower and upper ext  Neuro: Cannot assess fully given lethargy. Does move all extremities   Data Reviewed: I have personally reviewed following labs and imaging studies  CBC:  Recent Labs Lab 07/30/16 1926 07/31/16 0531 07/31/16 0701 08/01/16 0337 08/02/16 0339  WBC 16.6* 8.0 8.0 9.4 8.7  HGB 13.0 9.7* 9.9* 9.9* 10.4*  HCT 39.3 29.9* 30.5* 30.8* 31.9*  MCV 86.6 86.4 87.6 87.5 86.2  PLT 336 291 292 312 093   Basic Metabolic Panel:  Recent Labs Lab 07/27/16 0256 07/28/16 0428 07/30/16 2124 07/31/16 0531 08/01/16 0337 08/02/16 0339  NA 143 143 137 140 140 142  K 3.6 4.1 4.8 4.6 4.9 4.7  CL 118* 118* 115* 117* 119* 119*  CO2 17* 19* 18* 16* 14* 14*  GLUCOSE 87 86 92 84 73 76  BUN 27* 26* 38* 40* 44* 46*  CREATININE 0.85 0.92 1.08 1.18 1.35* 1.46*  CALCIUM 8.0* 8.1* 8.5* 8.5* 8.8* 9.3  MG 2.0  --   --   --   --   --   PHOS 4.6  --   --   --   --   --    GFR: Estimated Creatinine Clearance: 68.1 mL/min (A) (by C-G formula based on SCr of 1.46 mg/dL (H)). Liver Function Tests:  Recent Labs Lab 07/28/16 0428 07/30/16 2124  AST 22 21  ALT 16* 13*  ALKPHOS 107 98  BILITOT 1.4* 1.0  PROT 6.6 6.8  ALBUMIN 1.4* 1.4*   No results for input(s): LIPASE, AMYLASE in the last 168 hours. No results for input(s): AMMONIA in the last 168  hours. Coagulation Profile: No results for input(s): INR, PROTIME in the last 168 hours. Cardiac Enzymes: No results for input(s): CKTOTAL, CKMB, CKMBINDEX, TROPONINI in the last 168 hours. BNP (last 3 results) No results for  input(s): PROBNP in the last 8760 hours. HbA1C: No results for input(s): HGBA1C in the last 72 hours. CBG:  Recent Labs Lab 07/29/16 1526 07/30/16 0736 07/30/16 1207 07/30/16 1712 08/01/16 1728  GLUCAP 89 97 90 88 72   Lipid Profile: No results for input(s): CHOL, HDL, LDLCALC, TRIG, CHOLHDL, LDLDIRECT in the last 72 hours. Thyroid Function Tests: No results for input(s): TSH, T4TOTAL, FREET4, T3FREE, THYROIDAB in the last 72 hours. Anemia Panel: No results for input(s): VITAMINB12, FOLATE, FERRITIN, TIBC, IRON, RETICCTPCT in the last 72 hours. Urine analysis:    Component Value Date/Time   COLORURINE AMBER (A) 07/28/2016 1622   APPEARANCEUR TURBID (A) 07/28/2016 1622   LABSPEC 1.034 (H) 07/28/2016 1622   PHURINE 5.0 07/28/2016 1622   GLUCOSEU 50 (A) 07/28/2016 1622   HGBUR LARGE (A) 07/28/2016 1622   BILIRUBINUR SMALL (A) 07/28/2016 1622   KETONESUR 5 (A) 07/28/2016 1622   PROTEINUR >=300 (A) 07/28/2016 1622   NITRITE NEGATIVE 07/28/2016 1622   LEUKOCYTESUR NEGATIVE 07/28/2016 1622   Sepsis Labs: _0 (procalcitonin:4,lacticidven:4)  ) Recent Results (from the past 240 hour(s))  MRSA PCR Screening     Status: None   Collection Time: 07/23/16 10:30 AM  Result Value Ref Range Status   MRSA by PCR NEGATIVE NEGATIVE Final    Comment:        The GeneXpert MRSA Assay (FDA approved for NASAL specimens only), is one component of a comprehensive MRSA colonization surveillance program. It is not intended to diagnose MRSA infection nor to guide or monitor treatment for MRSA infections.   Urine culture     Status: None   Collection Time: 07/23/16  4:41 PM  Result Value Ref Range Status   Specimen Description URINE, CATHETERIZED  Final    Special Requests NONE  Final   Culture NO GROWTH  Final   Report Status 07/24/2016 FINAL  Final  Gram stain     Status: None   Collection Time: 08/01/16  3:09 PM  Result Value Ref Range Status   Specimen Description FLUID RIGHT PLEURAL  Final   Special Requests NONE  Final   Gram Stain   Final    ABUNDANT WBC PRESENT,BOTH PMN AND MONONUCLEAR NO ORGANISMS SEEN    Report Status 08/01/2016 FINAL  Final      Radiology Studies: Dg Chest 1 View  Result Date: 08/01/2016 CLINICAL DATA:  RIGHT pleural effusion EXAM: CHEST 1 VIEW COMPARISON:  CT chest 07/30/2016 FINDINGS: Normal heart size, mediastinal contours and pulmonary vascularity. Emphysematous changes with minimal central peribronchial thickening. Small RIGHT pleural effusion question extending into minor fissure. Atelectasis versus infiltrate retrocardiac LEFT lower lobe. Remaining lungs clear. Two areas of nodularity in LEFT lung correspond to the nodular foci on recent CT. No pneumothorax. IMPRESSION: COPD changes with small RIGHT pleural effusion likely extending into minor fissure. Nodular areas LEFT lung better assessed on recent CT chest. Emphysema (ICD10-J43.9). Electronically Signed   By: Lavonia Dana M.D.   On: 08/01/2016 15:45   Ct Head Wo Contrast  Result Date: 08/01/2016 CLINICAL DATA:  Altered mental status EXAM: CT HEAD WITHOUT CONTRAST TECHNIQUE: Contiguous axial images were obtained from the base of the skull through the vertex without intravenous contrast. COMPARISON:  Head CT 07/30/2016 FINDINGS: Brain: No mass lesion, intraparenchymal hemorrhage or extra-axial collection. No evidence of acute cortical infarct. Brain parenchyma and CSF-containing spaces are normal for age. Vascular: No hyperdense vessel or unexpected calcification. Skull: Normal visualized skull base, calvarium and extracranial soft tissues. Sinuses/Orbits: No sinus  fluid levels or advanced mucosal thickening. No mastoid effusion. Normal orbits. IMPRESSION:  Normal head CT. Electronically Signed   By: Ulyses Jarred M.D.   On: 08/01/2016 14:04   Ir Thoracentesis Asp Pleural Space W/img Guide  Result Date: 08/01/2016 INDICATION: Shortness of breath. Large right pleural effusion. Request diagnostic and therapeutic thoracentesis. EXAM: ULTRASOUND GUIDED RIGHT THORACENTESIS MEDICATIONS: None. COMPLICATIONS: None immediate. Postprocedural chest x-ray negative for pneumothorax. PROCEDURE: An ultrasound guided thoracentesis was thoroughly discussed with the patient and questions answered. The benefits, risks, alternatives and complications were also discussed. The patient understands and wishes to proceed with the procedure. Written consent was obtained. Ultrasound was performed to localize and mark an adequate pocket of fluid in the right chest. The area was then prepped and draped in the normal sterile fashion. 1% Lidocaine was used for local anesthesia. Under ultrasound guidance a Safe-T-Centesis catheter was introduced. Thoracentesis was performed. The catheter was removed and a dressing applied. FINDINGS: A total of approximately 1.5 L of hazy, yellow fluid was removed. Samples were sent to the laboratory as requested by the clinical team. IMPRESSION: Successful ultrasound guided right thoracentesis yielding 1.5 L of pleural fluid. Read by: Ascencion Dike PA-C Electronically Signed   By: Sandi Mariscal M.D.   On: 08/01/2016 15:34     Scheduled Meds: . buprenorphine-naloxone  1 tablet Sublingual Daily  . Chlorhexidine Gluconate Cloth  6 each Topical Daily  . cloNIDine  0.2 mg Transdermal Weekly  . feeding supplement  1 Container Oral TID BM  . folic acid  1 mg Oral Daily  . LORazepam  0-4 mg Intravenous Q12H  . metoprolol tartrate  5 mg Intravenous Q6H  . sodium chloride flush  10-40 mL Intracatheter Q12H  . sodium chloride flush  3 mL Intravenous Q12H   Continuous Infusions: . ceFEPime (MAXIPIME) IV Stopped (08/02/16 0351)  . dextrose 5 % and 0.45% NaCl      . heparin 1,700 Units/hr (08/02/16 0516)     LOS: 14 days   Time Spent in minutes   45 minutes  ,  D.O. on 08/02/2016 at 10:10 AM  Between 7am to 7pm - Pager - [phone]  After 7pm go to [email or web address] - password [email or web address]  And look for the night coverage person covering for me after hours  [facility] Office  [phone]

## 2016-08-02 NOTE — Progress Notes (Signed)
Night MD coverage paged in regards to pt not having BM charted since 7/21. Pt has little to no oral intake charted for last few days, BS+. Pt unable to take PO meds safely at this time due to lethargy. No PRNs available for constipation. Will await orders.

## 2016-08-02 NOTE — Progress Notes (Signed)
    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  Interval History: No new positive cultures, had thoracentesis yesterday.  No fever.    Physical Exam: Constitutional:  Vitals:   08/02/16 0700 08/02/16 0727  BP: (!) 138/106 (!) 138/106  Pulse: 93 71  Resp: 11 10  Temp:  98.2 F (36.8 C)   patient  Is sleeping, getting picc line Respiratory: Normal respiratory effort; CTA B Cardiovascular: RRR   Review of Systems: Unable to assess due to mental status  Lab Results  Component Value Date   WBC 8.7 08/02/2016   HGB 10.4 (L) 08/02/2016   HCT 31.9 (L) 08/02/2016   MCV 86.2 08/02/2016   PLT 316 08/02/2016    Lab Results  Component Value Date   CREATININE 1.46 (H) 08/02/2016   BUN 46 (H) 08/02/2016   NA 142 08/02/2016   K 4.7 08/02/2016   CL 119 (H) 08/02/2016   CO2 14 (L) 08/02/2016    Lab Results  Component Value Date   ALT 13 (L) 07/30/2016   AST 21 07/30/2016   ALKPHOS 98 07/30/2016     Microbiology: Recent Results (from the past 240 hour(s))  MRSA PCR Screening     Status: None   Collection Time: 07/23/16 10:30 AM  Result Value Ref Range Status   MRSA by PCR NEGATIVE NEGATIVE Final    Comment:        The GeneXpert MRSA Assay (FDA approved for NASAL specimens only), is one component of a comprehensive MRSA colonization surveillance program. It is not intended to diagnose MRSA infection nor to guide or monitor treatment for MRSA infections.   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  continue long-term cefepime through August 25th.   2.  Substance abuse - on suboxone and will need to continue.    3.  Disposition - he will not be a candidate for home IV antibiotics so will need placement or remain here for the duration.    Dr. Ninetta LightsHatcher available over the weekend if needed, otherwise I will follow up on Monday

## 2016-08-02 NOTE — Progress Notes (Signed)
ANTICOAGULATION CONSULT NOTE  Pharmacy Consult for heparin Indication: DVT  No Known Allergies  Patient Measurements: Height: 5\' 11"  (180.3 cm) Weight: 170 lb (77.1 kg) IBW/kg (Calculated) : 75.3 Heparin Dosing Weight: 76.7kg  Vital Signs: Temp: 98.2 F (36.8 C) (07/27 0727) Temp Source: Oral (07/27 0727) BP: 138/106 (07/27 0727) Pulse Rate: 71 (07/27 0727)  Labs:  Recent Labs  07/31/16 0531 07/31/16 0701  07/31/16 2319 08/01/16 0337 08/01/16 0715 08/02/16 0339  HGB 9.7* 9.9*  --   --  9.9*  --  10.4*  HCT 29.9* 30.5*  --   --  30.8*  --  31.9*  PLT 291 292  --   --  312  --  316  HEPARINUNFRC 0.17*  --   < > 0.35  --  0.54 0.47  CREATININE 1.18  --   --   --  1.35*  --  1.46*  < > = values in this interval not displayed.  Estimated Creatinine Clearance: 68.1 mL/min (A) (by C-G formula based on SCr of 1.46 mg/dL (H)).   Assessment: 45 YOM not on anticoagulation PTA who is being treated for endocarditis. Now with small segment of acute DVT in radial veins of left forearm.  heparin 1700 units/hr with HL within goal x 3  Renal: SCr 1.46  Heme/Onc: H&H 10.4/31.9, Plt 316  Goal of Therapy:  Heparin level 0.3-0.7 units/ml Monitor platelets by anticoagulation protocol: Yes   Plan:  Continue heparin 1700 units/hr Daily HL, CBC  Isaac BlissMichael Dhanvi Boesen, PharmD, BCPS, BCCCP Clinical Pharmacist Clinical phone for 08/02/2016 from 7a-3:30p: [phone] If after 3:30p, please call main pharmacy at: [phone] 08/02/2016 9:47 AM

## 2016-08-02 NOTE — Progress Notes (Addendum)
Called MD d/t pt w/ "restricted extremities" on both arms.  Per MD, okay for RT to stick right radial artery for ABG.  No complications noted, RN at bedside to assist RT w/ sticking ABG d/t pt moving around.   Noted pt to have irregular respiratory pattern ( 4-6 large breaths followed by 6-8 second pauses w/ breathing), this was discussed w/ RN and MD, & RT covering unit.  VSS, sat 97-98% on room air.

## 2016-08-02 NOTE — Progress Notes (Signed)
Name: Johnathan [name] MRN: [id number] DOB: 07/07/1970    ADMISSION DATE:  07/19/2016 CONSULTATION DATE:  07/23/2016  REFERRING MD :  Catha GosselinMikhail   CHIEF COMPLAINT:  Cavitary PNA   BRIEF PATIENT DESCRIPTION:  46 yo male with fever, sepsis, pneumonia and redness on Rt foot from cellulitis.  PMHx of IVDA.  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  STUDIES:  LP 7/13 > glucose 52, RBC 555, WBC 51, protein 88 Echo 7/14 >  EF 55 to 60%, grade 1 DD, mobile vegetation on TV Doppler lower legs b/l 7/17 > no DVT Doppler Lt arm 7/24 > acute DVT radial vein  CT angio chest 7/24 > ascending TAA 4.3 cm, b/l effusions Rt > Lt, severe emphysema, Rt lung collapse/consolidation, changes of cirrhosis with ascites and splenomegaly Thoracentesis 7/26 > 1.5 L Yellow output, WBC 2431, Lymphs 11, Neutrophil 82, Monocyte 7   ANTIBIOTICS:  Vancomycin 7/13 >> 7/13 Zosyn 7/13 >> 7/13 Nafcillin 7/13 >> 7/16 Cefepime 7/16 >>  CULTURES:  Blood 7/12 >> MSSA, Pseudomonas HIV 7/13 >> negative CSF 7/13 >> negative RVP 7/13 >> negative Blood 7/14 >> MSSA  EVENTS:  7/13 admit, ID consult for MSSA and Pseudomonas bacteremia 7/17 Transfer to ICU for precedex for opiate withdrawal symptoms 7/22 Transfer to SDU  SUBJECTIVE:  No events overnight. Thoracentesis 7/26  VITAL SIGNS: Temp:  [96.7 F (35.9 C)-98.2 F (36.8 C)] 98.2 F (36.8 C) (07/27 0727) Pulse Rate:  [71-108] 71 (07/27 0727) Resp:  [9-25] 10 (07/27 0727) BP: (126-171)/(78-109) 138/106 (07/27 0727) SpO2:  [92 %-99 %] 99 % (07/27 0727) Weight:  [77.1 kg (170 lb)] 77.1 kg (170 lb) (07/27 0300)  PHYSICAL EXAMINATION: General:  Adult male, no distress  Neuro: Alert, slow to respond, does not follow commands, moves extremities  HEENT:  Normocephalic  Cardiovascular:  RRR, no MRG Lungs:  Diminished breath sounds to bases, no wheeze/crackles, non-labored   Abdomen:  Non-distended,  active bowel sounds Musculoskeletal:  -edema  Skin:  Warm, dry    Recent Labs Lab 07/31/16 0531 08/01/16 0337 08/02/16 0339  NA 140 140 142  K 4.6 4.9 4.7  CL 117* 119* 119*  CO2 16* 14* 14*  BUN 40* 44* 46*  CREATININE 1.18 1.35* 1.46*  GLUCOSE 84 73 76    Recent Labs Lab 07/31/16 0701 08/01/16 0337 08/02/16 0339  HGB 9.9* 9.9* 10.4*  HCT 30.5* 30.8* 31.9*  WBC 8.0 9.4 8.7  PLT 292 312 316   Dg Chest 1 View  Result Date: 08/01/2016 CLINICAL DATA:  RIGHT pleural effusion EXAM: CHEST 1 VIEW COMPARISON:  CT chest 07/30/2016 FINDINGS: Normal heart size, mediastinal contours and pulmonary vascularity. Emphysematous changes with minimal central peribronchial thickening. Small RIGHT pleural effusion question extending into minor fissure. Atelectasis versus infiltrate retrocardiac LEFT lower lobe. Remaining lungs clear. Two areas of nodularity in LEFT lung correspond to the nodular foci on recent CT. No pneumothorax. IMPRESSION: COPD changes with small RIGHT pleural effusion likely extending into minor fissure. Nodular areas LEFT lung better assessed on recent CT chest. Emphysema (ICD10-J43.9). Electronically Signed   By: Ulyses SouthwardMark  Boles M.D.   On: 08/01/2016 15:45   Ct Head Wo Contrast  Result Date: 08/01/2016 CLINICAL DATA:  Altered mental status EXAM: CT HEAD WITHOUT CONTRAST TECHNIQUE: Contiguous axial images were obtained from the base of the skull through the vertex without intravenous contrast. COMPARISON:  Head CT 07/30/2016 FINDINGS: Brain: No mass lesion, intraparenchymal hemorrhage or extra-axial collection. No  evidence of acute cortical infarct. Brain parenchyma and CSF-containing spaces are normal for age. Vascular: No hyperdense vessel or unexpected calcification. Skull: Normal visualized skull base, calvarium and extracranial soft tissues. Sinuses/Orbits: No sinus fluid levels or advanced mucosal thickening. No mastoid effusion. Normal orbits. IMPRESSION: Normal head CT.  Electronically Signed   By: Deatra RobinsonKevin  Herman M.D.   On: 08/01/2016 14:04   Ir Thoracentesis Asp Pleural Space W/img Guide  Result Date: 08/01/2016 INDICATION: Shortness of breath. Large right pleural effusion. Request diagnostic and therapeutic thoracentesis. EXAM: ULTRASOUND GUIDED RIGHT THORACENTESIS MEDICATIONS: None. COMPLICATIONS: None immediate. Postprocedural chest x-ray negative for pneumothorax. PROCEDURE: An ultrasound guided thoracentesis was thoroughly discussed with the patient and questions answered. The benefits, risks, alternatives and complications were also discussed. The patient understands and wishes to proceed with the procedure. Written consent was obtained. Ultrasound was performed to localize and mark an adequate pocket of fluid in the right chest. The area was then prepped and draped in the normal sterile fashion. 1% Lidocaine was used for local anesthesia. Under ultrasound guidance a Safe-T-Centesis catheter was introduced. Thoracentesis was performed. The catheter was removed and a dressing applied. FINDINGS: A total of approximately 1.5 L of hazy, yellow fluid was removed. Samples were sent to the laboratory as requested by the clinical team. IMPRESSION: Successful ultrasound guided right thoracentesis yielding 1.5 L of pleural fluid. Read by: Brayton ElKevin Bruning PA-C Electronically Signed   By: Simonne ComeJohn  Watts M.D.   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 Will follow up on Monday with culture  from thoracentesis.    [name], AGACNP-BC [facility]  Pgr: [phone]  PCCM Pgr: [phone]

## 2016-08-03 DIAGNOSIS — R4182 Altered mental status, unspecified: Secondary | ICD-10-CM

## 2016-08-03 LAB — BASIC METABOLIC PANEL
ANION GAP: 5 (ref 5–15)
BUN: 48 mg/dL — ABNORMAL HIGH (ref 6–20)
CALCIUM: 9 mg/dL (ref 8.9–10.3)
CHLORIDE: 120 mmol/L — AB (ref 101–111)
CO2: 15 mmol/L — AB (ref 22–32)
CREATININE: 1.3 mg/dL — AB (ref 0.61–1.24)
GFR calc non Af Amer: >60 mL/min (ref >60)
Glucose, Bld: 124 mg/dL — ABNORMAL HIGH (ref 65–99)
Potassium: 4.1 mmol/L (ref 3.5–5.1)
SODIUM: 140 mmol/L (ref 135–145)

## 2016-08-03 LAB — CBC
HEMATOCRIT: 28.3 % — AB (ref 39.0–52.0)
Hemoglobin: 9.4 g/dL — ABNORMAL LOW (ref 13.0–17.0)
MCH: 28.5 pg (ref 26.0–34.0)
MCHC: 33.2 g/dL (ref 30.0–36.0)
MCV: 85.8 fL (ref 78.0–100.0)
Platelets: 258 10*3/uL (ref 150–400)
RBC: 3.3 MIL/uL — AB (ref 4.22–5.81)
RDW: 18.9 % — AB (ref 11.5–15.5)
WBC: 8 10*3/uL (ref 4.0–10.5)

## 2016-08-03 LAB — GLUCOSE, CAPILLARY
GLUCOSE-CAPILLARY: 117 mg/dL — AB (ref 65–99)
Glucose-Capillary: 111 mg/dL — ABNORMAL HIGH (ref 65–99)
Glucose-Capillary: 114 mg/dL — ABNORMAL HIGH (ref 65–99)

## 2016-08-03 LAB — HEPARIN LEVEL (UNFRACTIONATED): Heparin Unfractionated: 0.35 IU/mL (ref 0.30–0.70)

## 2016-08-03 MED ORDER — HYDRALAZINE HCL 20 MG/ML IJ SOLN
10.0000 mg | Freq: Four times a day (QID) | INTRAMUSCULAR | Status: DC | PRN
Start: 2016-08-03 — End: 2016-08-03
  Administered 2016-08-03 (×2): 10 mg via INTRAVENOUS
  Filled 2016-08-03 (×3): qty 1

## 2016-08-03 MED ORDER — HYDRALAZINE HCL 20 MG/ML IJ SOLN
INTRAMUSCULAR | Status: AC
  Filled 2016-08-03: qty 1

## 2016-08-03 MED ORDER — HYDRALAZINE HCL 20 MG/ML IJ SOLN: 10.0000 mg | Freq: Four times a day (QID) | INTRAMUSCULAR | Status: DC | PRN

## 2016-08-03 MED ORDER — NITROGLYCERIN 2 % TD OINT
1.0000 [in_us] | TOPICAL_OINTMENT | Freq: Four times a day (QID) | TRANSDERMAL | Status: DC
  Administered 2016-08-03 – 2016-08-09 (×21): 1 [in_us] via TOPICAL
  Filled 2016-08-03: qty 30

## 2016-08-03 MED ORDER — HYDRALAZINE HCL 20 MG/ML IJ SOLN
20.0000 mg | Freq: Four times a day (QID) | INTRAMUSCULAR | Status: DC | PRN
  Administered 2016-08-04: 20 mg via INTRAVENOUS
  Filled 2016-08-03: qty 1

## 2016-08-03 MED ORDER — HYDRALAZINE HCL 20 MG/ML IJ SOLN
10.0000 mg | Freq: Four times a day (QID) | INTRAMUSCULAR | Status: DC | PRN
  Administered 2016-08-03: 10 mg via INTRAVENOUS

## 2016-08-03 NOTE — Progress Notes (Signed)
Night coverage MD paged in regards to patients elevated BP. No PRNs available. Will await new orders.

## 2016-08-03 NOTE — Progress Notes (Signed)
Patient became increasingly agitated through out the am.  Father at the bedside.  Father came and got TN stated patient was climbing out the bed and would have come out of the bed if he was strong enough.  Paatient continued to be restless, agitated, and shaky.  @mg  Ativan given Iv slowly with good results.  Patient allowed RN to provide oral car care and rea[ly EKG leads.  Will continue to monitor.

## 2016-08-03 NOTE — Progress Notes (Signed)
MD for night coverage paged in regards to patients BP. Consistently from 170s-190s SBP over the last 24 hours with current scheduled lopressor doses and PRN hydralazine admins. Will await new orders.

## 2016-08-03 NOTE — Evaluation (Signed)
Clinical/Bedside Swallow Evaluation Patient Details  Name: Johnathan [name] MRN: [id number] Date of Birth: June 11, 1970  Today's Date: 08/03/2016 Time: SLP Start Time (ACUTE ONLY): [id number] SLP Stop Time (ACUTE ONLY): 0924 SLP Time Calculation (min) (ACUTE ONLY): 19 min  Past Medical History:  Past Medical History:  Diagnosis Date  . Substance abuse    Past Surgical History:  Past Surgical History:  Procedure Laterality Date  . Incision and drainage of bilateral forearm abscesses Bilateral   . IR THORACENTESIS ASP PLEURAL SPACE W/IMG GUIDE  08/01/2016  . KNEE ARTHROSCOPY     HPI:  Johnathan [name] a 45 y.o.male admitted with sepsis secondary to UTI vs cellulitis and ARF and hyponatremia(note LP pending due to ? Neck stiffness). PMH:w Hep C?, IVDA, (recently injected opana), c/o fever intermittently and redness over the dorsum of the right foot and slight swelling. Per chart pt notes injecting Opana about 2 days prior to admit. Pt seen for BSE 7/13 without indications of oropharyngeal dysphagia; shallow respiratory pattern; regular diet/thin recommended and signed off. Transferred to ICU for Precedex due to ETOH withdrawal 7/17. Developed right cavitary PNA from septic emboli with right pleural effusion. Regular diet order however pt has been lethargic.   Assessment / Plan / Recommendation Clinical Impression  Pt with eyes open, blank stare, restless and did not follow directions. Father present stating he was unresponsive several days ago and is trying to "wake up now; don't think he can eat." Pt adamently resistant to oral care, swiping therapist's hands away. Appeared to be slightly moving jaw as if attempting to masticate; suspect dried mucous/residue in oral cavity. Able to insert Yankeurs briefly, not retreiving substance. He expectorated small amount phlegm on floor. Received small ice chip then expectorated soon after. Not ready for po's. He is on a regular diet and receiving  trays- please change order to NPO. Hopeful that he is becoming more alert and will be able to follow directions and safely consume po's and /or participate in objective testing if needed. Will follow.  SLP Visit Diagnosis: Dysphagia, unspecified (R13.10)    Aspiration Risk  Severe aspiration risk    Diet Recommendation NPO   Medication Administration: Via alternative means    Other  Recommendations Oral Care Recommendations: Oral care QID   Follow up Recommendations Skilled Nursing facility      Frequency and Duration min 2x/week  2 weeks       Prognosis Prognosis for Safe Diet Advancement: Good Barriers to Reach Goals: Cognitive deficits      Swallow Study   General HPI: Johnathan [name] a 45 y.o.male admitted with sepsis secondary to UTI vs cellulitis and ARF and hyponatremia(note LP pending due to ? Neck stiffness). PMH:w Hep C?, IVDA, (recently injected opana), c/o fever intermittently and redness over the dorsum of the right foot and slight swelling. Per chart pt notes injecting Opana about 2 days prior to admit. Pt seen for BSE 7/13 without indications of oropharyngeal dysphagia; shallow respiratory pattern; regular diet/thin recommended and signed off. Transferred to ICU for Precedex due to ETOH withdrawal 7/17. Developed right cavitary PNA from septic emboli with right pleural effusion. Regular diet order however pt has been lethargic. Type of Study: Bedside Swallow Evaluation Previous Swallow Assessment:  (see HPI) Diet Prior to this Study: Regular;Thin liquids Temperature Spikes Noted: No Respiratory Status: Room air History of Recent Intubation: No Behavior/Cognition: Requires cueing;Lethargic/Drowsy;Doesn't follow directions Oral Cavity Assessment:  (resists attempts to clean- almost agitation) Oral Care Completed by SLP:  (attempted by SLP) Oral  Cavity - Dentition:  (unable to view) Self-Feeding Abilities: Total assist Patient Positioning: Upright in  bed Baseline Vocal Quality:  (no vocalization) Volitional Cough: Cognitively unable to elicit Volitional Swallow: Unable to elicit    Oral/Motor/Sensory Function Overall Oral Motor/Sensory Function: Generalized oral weakness   Ice Chips Ice chips: Impaired Presentation: Spoon Oral Phase Impairments: Reduced labial seal;Reduced lingual movement/coordination;Poor awareness of bolus Oral Phase Functional Implications:  (expectorated ice) Pharyngeal Phase Impairments:  (no swallow initiated)   Thin Liquid Thin Liquid: Not tested    Nectar Thick Nectar Thick Liquid: Not tested   Honey Thick Honey Thick Liquid: Not tested   Puree Puree: Not tested   Solid   GO   Solid: Not tested        [name], Turki Tapanes Willis 08/03/2016,9:49 AM

## 2016-08-03 NOTE — Progress Notes (Signed)
ANTICOAGULATION CONSULT NOTE  Pharmacy Consult for heparin Indication: DVT  No Known Allergies  Patient Measurements: Height: 5\' 11"  (180.3 cm) Weight: 170 lb (77.1 kg) IBW/kg (Calculated) : 75.3 Heparin Dosing Weight: 76.7kg  Vital Signs: Temp: 97.6 F (36.4 C) (07/28 0706) Temp Source: Axillary (07/28 0706) BP: 183/98 (07/28 0706) Pulse Rate: 92 (07/28 0706)  Labs:  Recent Labs  08/01/16 0337 08/01/16 0715 08/02/16 0339 08/03/16 0403  HGB 9.9*  --  10.4* 9.4*  HCT 30.8*  --  31.9* 28.3*  PLT 312  --  316 258  HEPARINUNFRC  --  0.54 0.47 0.35  CREATININE 1.35*  --  1.46* 1.30*    Estimated Creatinine Clearance: 76.4 mL/min (A) (by C-G formula based on SCr of 1.3 mg/dL (H)).   Assessment: 45 YOM not on anticoagulation PTA who is being treated for endocarditis. Now with small segment of acute DVT in radial veins of left forearm.  Heparin level remains therapeutic this morning, though trended down slightly.  Hgb 9.4, plts 258- slight drop, no bleeding noted.   Goal of Therapy:  Heparin level 0.3-0.7 units/ml Monitor platelets by anticoagulation protocol: Yes   Plan:  Increase heparin to 1750units/hr Daily Heparin Level and CBC Follow s/s bleeding and long term anticoagulation plans  Genoveva Singleton D. Marky Buresh, PharmD, BCPS Clinical Pharmacist Pager: [phone] Clinical Phone for 08/03/2016 until 3:30pm: [phone] If after 3:30pm, please call main pharmacy at [phone] 08/03/2016 11:41 AM

## 2016-08-03 NOTE — Progress Notes (Signed)
PROGRESS NOTE    Johnathan [name]  OEH:[id number] DOB: 20-Aug-1970 DOA: 07/19/2016 PCP: [name], MD   Chief Complaint  Patient presents with  . Cellulitis     Brief Narrative:  HPI On 07/19/2016 by Dr. [name] [name] a 46 y.o.male,w Hep C?, IVDA, (recently injected opana) apparently c/o fever intermittently for the past 1 week, and redness over the dorsum of the right foot and slight swelling. Pt notes injecting Opana about 2 days ago. Pt denies any recent dental work. Pt presented to ED due to subjective fevers.  In ED, Pt found to have mild cellulitis of the right >left foot as well as uti wbc 6-30. Pt noted to be in ARF bun/creat 26/3.17, And to have abnormal LFT, Ast 47, Alt 25, Alk phos 206, T. Bili 1.6, Alb 2.4. And hyponatremia Na 123 , CXR right lung base slightly more patchy may reflect aspiration or pneumonia. Possible small right pleural effusion Pt will be admitted for sepsis secondary to uti vs cellulitis and ARF and hyponatremia. (note LP pending due to ? Neck stiffness)  Interim history Since his admission he has been found to have MSSA bacteremia, possible meningitis, and LE cellulitis. TTE showed EF 03-70%, grade 1 diastolic dysfunction, and tricuspid valve with a mobile vegetation. On 7/17 he developed acute encephalopathy with agitation, becoming belligerent and combative. He was placed on CIWA protocol, however patient was not responsive to ativan and required transfer to the ICU for a precedex gtt. S/p thoracentesis. Still remains somewhat lethargic.   Assessment & Plan   RLL Cavitary pneumonia with pleural effusion -Septic emboli versus aspiration -PCCM consulted and following -Continue supplemental oxygen to maintain saturations above 92%.  -Patient currently maintaining 100% oxygen saturation on room air. -Interventional radiology consulted and appreciated, s/p Right thoracentesis which yielded 1.5L of yellow  fluid. -Fluid culture shows no growth to date. Gram stain shows no organisms -Fluid cytology shows acute inflammation and reactive mesothelial cells. No malignant cells identified -Patient does have some periods of apnea. Discussed with respiratory therapy, at this time patient appears to be comfortable, oxygen saturations maintaining in the high 90s to monitor percent on room air. Discussed possible need for C Pap however doubt patient would keep mask in place.  Sepsis secondary to MSSA bacteremia/endocarditis of the tricuspid valve/Pseudomonas bacteremia -Patient did have positive blood cultures for Pseudomonas as well as MSSA -Infectious disease consulted and appreciated -Continue cefepime -Patient status post echocardiogram as well as TEE showing tricuspid valve endocarditis, EF of 48-88%, grade 1 diastolic dysfunction -Given patient's IV drug abuse, and history of injection, patient will need SNF versus prolonged hospital stay for antibiotics -Case management social work consulted -PICC line ordered, however if patient's mental status improves, and he opts to leave AMA, PICC should be REMOVED before he leaves given his history of IVDA  Acute encephalopathy/withdrawal -Patient did require ICU/Precedex drip for agitation and withdrawal -Currently patient still not very interactive. Given the patient has been admitted for 15 days, unlikely that he is still withdrawing from some substance. -CT head was obtained twice, unremarkable -MRI may be beneficial however doubt patient will stay still for this imaging. Discussed with mother, patient would likely need some form of sedation or anesthesia, will not pursue at this time. -Will continue to monitor closely. -Continue Ativan and Haldol as needed -obtained ammonia level, within normal limits  Acute kidney injury -Secondary to sepsisand poor oral intake -Renal ultrasound showed mild right pelvicaliectasis with ureteral jet noted and urinary  bladder excluding significant ureteral obstruction -Creatinine on admission 3.17 -placed patient on gentle IV fluid 08/02/2016 given his creatinine increased to 1.46, creatinine mildly improved to 1.3 -Continue to monitor BMP  Illicit drug use/polysubstance abuse -Discussed cessation -Currently on Suboxone  Acute LUE DVT -continue heparin drip -Patient will need oral anticoagulation unable to tolerate  Hepatitis C -Will need outpatient follow-up to determine if patient is a candidate for therapy -LFTs appear to be stable  Moderate malnutrition/hypoalbuminemia -Nutrition consulted, continue supplements- however patient currently has poor oral intake over the past several days -continueD51/2 NS at 50cc per hour given his volume status -Speech therapy consulted as patient was choking with fluids on 08/01/16 -Speech therapy evaluate patient today and recommended nothing by mouth   Abnormal thyroid testing -Given clinical illness, would not start treatment at this time -TSH is 0.208, FT4 2.51 -Would repeat thyroid testing upon discharge  Cellulitis of the right lower extremity -Resolved  Normocytic anemia -Currently on heparin -Hemoglobin appears stable over the past several days, currently 9.4 -hemoglobin was 11 at the beginning of his admission, suspect drop in hemoglobin due to dilutional component as patient received IVF for sepsis and AKI -Continue to monitor CBC  DVT Prophylaxis  heparin  Code Status: Full  Family Communication: None at bedside. Discussed with mother via phone  Disposition Plan: To be determined. Currently in stepdown. Pending improvement in mental status. Will likely need to continue admission until antibiotics are completed  Consultants Infectious disease PCCM Cardiology Interventional radiology  Procedures  Lumbar puncture Renal US Echocardiogram TEE Lower extremity Doppler Left upper extremity Doppler US Guided Right thoracentesis  PICC  line placement   Antibiotics   Anti-infectives    Start     Dose/Rate Route Frequency Ordered Stop   07/22/16 1400  ceFEPIme (MAXIPIME) 2 g in dextrose 5 % 50 mL IVPB     2 g 100 mL/hr over 30 Minutes Intravenous Every 8 hours 07/22/16 1122 09/01/16 0259   07/20/16 1500  ceFEPIme (MAXIPIME) 2 g in dextrose 5 % 50 mL IVPB  Status:  Discontinued     2 g 100 mL/hr over 30 Minutes Intravenous Every 12 hours 07/20/16 1410 07/22/16 1122   07/19/16 2300  vancomycin (VANCOCIN) IVPB 750 mg/150 ml premix  Status:  Discontinued     750 mg 150 mL/hr over 60 Minutes Intravenous Every 24 hours 07/19/16 0237 07/19/16 0953   07/19/16 2200  ceFEPIme (MAXIPIME) 2 g in dextrose 5 % 50 mL IVPB  Status:  Discontinued     2 g 100 mL/hr over 30 Minutes Intravenous Every 24 hours 07/19/16 1931 07/20/16 1410   07/19/16 2000  nafcillin 2 g in dextrose 5 % 100 mL IVPB  Status:  Discontinued     2 g 200 mL/hr over 30 Minutes Intravenous Every 4 hours 07/19/16 1915 07/22/16 1130   07/19/16 1915  nafcillin injection 2 g  Status:  Discontinued     2 g Intravenous Every 4 hours 07/19/16 1912 07/19/16 1915   07/19/16 1800  ceFEPIme (MAXIPIME) 2 g in dextrose 5 % 50 mL IVPB  Status:  Discontinued     2 g 100 mL/hr over 30 Minutes Intravenous Every 24 hours 07/19/16 1646 07/19/16 1912   07/19/16 1100  vancomycin (VANCOCIN) 500 mg in sodium chloride 0.9 % 100 mL IVPB  Status:  Discontinued     500 mg 100 mL/hr over 60 Minutes Intravenous Every 12 hours 07/19/16 0953 07/19/16 1645   07/19/16 0800  piperacillin-tazobactam (  ZOSYN) IVPB 3.375 g  Status:  Discontinued     3.375 g 12.5 mL/hr over 240 Minutes Intravenous Every 8 hours 07/19/16 0239 07/19/16 1645   07/19/16 0200  cefTRIAXone (ROCEPHIN) 2 g in dextrose 5 % 50 mL IVPB     2 g 100 mL/hr over 30 Minutes Intravenous  Once 07/19/16 0140 07/19/16 0259   07/19/16 0130  cefTRIAXone (ROCEPHIN) 1 g in dextrose 5 % 50 mL IVPB  Status:  Discontinued     1 g 100 mL/hr  over 30 Minutes Intravenous  Once 07/19/16 0125 07/19/16 0140   07/19/16 0115  piperacillin-tazobactam (ZOSYN) IVPB 3.375 g     3.375 g 100 mL/hr over 30 Minutes Intravenous  Once 07/19/16 0109 07/19/16 0219   07/19/16 0115  vancomycin (VANCOCIN) IVPB 1000 mg/200 mL premix     1,000 mg 200 mL/hr over 60 Minutes Intravenous  Once 07/19/16 0109 07/19/16 0243      Subjective:   Marius Ditch seen and examined today.  Not interactive. Does open eyes however shuts them again. Is able to move with ease.   Objective:   Vitals:   08/03/16 0351 08/03/16 0400 08/03/16 0600 08/03/16 0706  BP: (!) 180/99 (!) 178/98 (!) 172/83 (!) 183/98  Pulse: 88 97 83 92  Resp: 20 (!) 23 (!) 21 17  Temp: 97.6 F (36.4 C)   97.6 F (36.4 C)  TempSrc: Axillary   Axillary  SpO2: 99% 98% 99% 100%  Weight:      Height:        Intake/Output Summary (Last 24 hours) at 08/03/16 1152 Last data filed at 08/03/16 1015  Gross per 24 hour  Intake              507 ml  Output              850 ml  Net             -343 ml   Filed Weights   08/01/16 0400 08/02/16 0300 08/03/16 0217  Weight: 80.3 kg (177 lb) 77.1 kg (170 lb) 77.1 kg (170 lb)   Exam  General: Well developed, Chronically ill appearing, no apparent distress  HEENT: NCAT, mucous membranes dry  Cardiovascular: S1 S2 auscultated, soft SEM, RRR  Respiratory: Diminished breath sounds, normal respiratory effort  Abdomen: Soft, nontender, nondistended, + bowel sounds  Extremities: warm dry without cyanosis clubbing. +Upper/lower ext edema. Upper ext edema improving  Neuro:  Awakens, but does not respond to questions. Moves all extremities with ease. Able to turn in bed.  Skin: Peeling skin   Data Reviewed: I have personally reviewed following labs and imaging studies  CBC:  Recent Labs Lab 07/31/16 0531 07/31/16 0701 08/01/16 0337 08/02/16 0339 08/03/16 0403  WBC 8.0 8.0 9.4 8.7 8.0  HGB 9.7* 9.9* 9.9* 10.4* 9.4*  HCT 29.9*  30.5* 30.8* 31.9* 28.3*  MCV 86.4 87.6 87.5 86.2 85.8  PLT 291 292 312 316 267   Basic Metabolic Panel:  Recent Labs Lab 07/30/16 2124 07/31/16 0531 08/01/16 0337 08/02/16 0339 08/03/16 0403  NA 137 140 140 142 140  K 4.8 4.6 4.9 4.7 4.1  CL 115* 117* 119* 119* 120*  CO2 18* 16* 14* 14* 15*  GLUCOSE 92 84 73 76 124*  BUN 38* 40* 44* 46* 48*  CREATININE 1.08 1.18 1.35* 1.46* 1.30*  CALCIUM 8.5* 8.5* 8.8* 9.3 9.0   GFR: Estimated Creatinine Clearance: 76.4 mL/min (A) (by C-G formula based on SCr of 1.3  mg/dL (H)). Liver Function Tests:  Recent Labs Lab 07/28/16 0428 07/30/16 2124  AST 22 21  ALT 16* 13*  ALKPHOS 107 98  BILITOT 1.4* 1.0  PROT 6.6 6.8  ALBUMIN 1.4* 1.4*   No results for input(s): LIPASE, AMYLASE in the last 168 hours.  Recent Labs Lab 08/02/16 1131  AMMONIA 28   Coagulation Profile: No results for input(s): INR, PROTIME in the last 168 hours. Cardiac Enzymes: No results for input(s): CKTOTAL, CKMB, CKMBINDEX, TROPONINI in the last 168 hours. BNP (last 3 results) No results for input(s): PROBNP in the last 8760 hours. HbA1C: No results for input(s): HGBA1C in the last 72 hours. CBG:  Recent Labs Lab 07/30/16 1207 07/30/16 1712 08/01/16 1728 08/02/16 1713 08/03/16 0810  GLUCAP 90 88 72 91 111*   Lipid Profile: No results for input(s): CHOL, HDL, LDLCALC, TRIG, CHOLHDL, LDLDIRECT in the last 72 hours. Thyroid Function Tests: No results for input(s): TSH, T4TOTAL, FREET4, T3FREE, THYROIDAB in the last 72 hours. Anemia Panel: No results for input(s): VITAMINB12, FOLATE, FERRITIN, TIBC, IRON, RETICCTPCT in the last 72 hours. Urine analysis:    Component Value Date/Time   COLORURINE AMBER (A) 07/28/2016 1622   APPEARANCEUR TURBID (A) 07/28/2016 1622   LABSPEC 1.034 (H) 07/28/2016 1622   PHURINE 5.0 07/28/2016 1622   GLUCOSEU 50 (A) 07/28/2016 1622   HGBUR LARGE (A) 07/28/2016 1622   BILIRUBINUR SMALL (A) 07/28/2016 1622   KETONESUR  5 (A) 07/28/2016 1622   PROTEINUR >=300 (A) 07/28/2016 1622   NITRITE NEGATIVE 07/28/2016 1622   LEUKOCYTESUR NEGATIVE 07/28/2016 1622   Sepsis Labs: '@LABRCNTIP' (procalcitonin:4,lacticidven:4)  ) Recent Results (from the past 240 hour(s))  Gram stain     Status: None   Collection Time: 08/01/16  3:09 PM  Result Value Ref Range Status   Specimen Description FLUID RIGHT PLEURAL  Final   Special Requests NONE  Final   Gram Stain   Final    ABUNDANT WBC PRESENT,BOTH PMN AND MONONUCLEAR NO ORGANISMS SEEN    Report Status 08/01/2016 FINAL  Final  Acid Fast Smear (AFB)     Status: None   Collection Time: 08/01/16  3:09 PM  Result Value Ref Range Status   AFB Specimen Processing Concentration  Final   Acid Fast Smear Negative  Final    Comment: (NOTE) Performed At: [facility] [place], [place] [id number] Lindon Romp MD [id number]    Source (AFB) FLUID  Final    Comment: RIGHT PLEURAL   Culture, body fluid-bottle     Status: None (Preliminary result)   Collection Time: 08/01/16  3:09 PM  Result Value Ref Range Status   Specimen Description FLUID RIGHT PLEURAL  Final   Special Requests BOTTLES DRAWN AEROBIC AND ANAEROBIC 10CC  Final   Culture NO GROWTH < 24 HOURS  Final   Report Status PENDING  Incomplete      Radiology Studies: Dg Chest 1 View  Result Date: 08/01/2016 CLINICAL DATA:  RIGHT pleural effusion EXAM: CHEST 1 VIEW COMPARISON:  CT chest 07/30/2016 FINDINGS: Normal heart size, mediastinal contours and pulmonary vascularity. Emphysematous changes with minimal central peribronchial thickening. Small RIGHT pleural effusion question extending into minor fissure. Atelectasis versus infiltrate retrocardiac LEFT lower lobe. Remaining lungs clear. Two areas of nodularity in LEFT lung correspond to the nodular foci on recent CT. No pneumothorax. IMPRESSION: COPD changes with small RIGHT pleural effusion likely extending into minor fissure. Nodular  areas LEFT lung better assessed on recent CT chest.  Emphysema (ICD10-J43.9). Electronically Signed   By: Lavonia Dana M.D.   On: 08/01/2016 15:45   Ct Head Wo Contrast  Result Date: 08/01/2016 CLINICAL DATA:  Altered mental status EXAM: CT HEAD WITHOUT CONTRAST TECHNIQUE: Contiguous axial images were obtained from the base of the skull through the vertex without intravenous contrast. COMPARISON:  Head CT 07/30/2016 FINDINGS: Brain: No mass lesion, intraparenchymal hemorrhage or extra-axial collection. No evidence of acute cortical infarct. Brain parenchyma and CSF-containing spaces are normal for age. Vascular: No hyperdense vessel or unexpected calcification. Skull: Normal visualized skull base, calvarium and extracranial soft tissues. Sinuses/Orbits: No sinus fluid levels or advanced mucosal thickening. No mastoid effusion. Normal orbits. IMPRESSION: Normal head CT. Electronically Signed   By: Ulyses Jarred M.D.   On: 08/01/2016 14:04   Ir Thoracentesis Asp Pleural Space W/img Guide  Result Date: 08/01/2016 INDICATION: Shortness of breath. Large right pleural effusion. Request diagnostic and therapeutic thoracentesis. EXAM: ULTRASOUND GUIDED RIGHT THORACENTESIS MEDICATIONS: None. COMPLICATIONS: None immediate. Postprocedural chest x-ray negative for pneumothorax. PROCEDURE: An ultrasound guided thoracentesis was thoroughly discussed with the patient and questions answered. The benefits, risks, alternatives and complications were also discussed. The patient understands and wishes to proceed with the procedure. Written consent was obtained. Ultrasound was performed to localize and mark an adequate pocket of fluid in the right chest. The area was then prepped and draped in the normal sterile fashion. 1% Lidocaine was used for local anesthesia. Under ultrasound guidance a Safe-T-Centesis catheter was introduced. Thoracentesis was performed. The catheter was removed and a dressing applied. FINDINGS: A total of  approximately 1.5 L of hazy, yellow fluid was removed. Samples were sent to the laboratory as requested by the clinical team. IMPRESSION: Successful ultrasound guided right thoracentesis yielding 1.5 L of pleural fluid. Read by: Ascencion Dike PA-C Electronically Signed   By: Sandi Mariscal M.D.   On: 08/01/2016 15:34     Scheduled Meds: . buprenorphine-naloxone  1 tablet Sublingual Daily  . Chlorhexidine Gluconate Cloth  6 each Topical Daily  . cloNIDine  0.2 mg Transdermal Weekly  . feeding supplement  1 Container Oral TID BM  . folic acid  1 mg Oral Daily  . metoprolol tartrate  5 mg Intravenous Q6H  . sodium chloride flush  10-40 mL Intracatheter Q12H  . sodium chloride flush  3 mL Intravenous Q12H   Continuous Infusions: . ceFEPime (MAXIPIME) IV 2 g (08/03/16 1015)  . dextrose 5 % and 0.45% NaCl 50 mL/hr at 08/03/16 0534  . heparin 1,700 Units/hr (08/03/16 0419)     LOS: 15 days   Time Spent in minutes   45 minutes  Viha Kriegel D.O. on 08/03/2016 at 11:52 AM  Between 7am to 7pm - Pager - [phone]  After 7pm go to [email or web address] - password [email or web address]  And look for the night coverage person covering for me after hours  [facility] Office  [phone]

## 2016-08-04 LAB — BASIC METABOLIC PANEL
ANION GAP: 3 — AB (ref 5–15)
BUN: 42 mg/dL — AB (ref 6–20)
CHLORIDE: 120 mmol/L — AB (ref 101–111)
CO2: 16 mmol/L — AB (ref 22–32)
Calcium: 8.6 mg/dL — ABNORMAL LOW (ref 8.9–10.3)
Creatinine, Ser: 1.08 mg/dL (ref 0.61–1.24)
GFR calc Af Amer: >60 mL/min (ref >60)
GLUCOSE: 254 mg/dL — AB (ref 65–99)
POTASSIUM: 3.7 mmol/L (ref 3.5–5.1)
Sodium: 139 mmol/L (ref 135–145)

## 2016-08-04 LAB — RETICULOCYTES
RBC.: 2.89 MIL/uL — ABNORMAL LOW (ref 4.22–5.81)
RETIC CT PCT: 2.8 % (ref 0.4–3.1)
Retic Count, Absolute: 80.9 10*3/uL (ref 19.0–186.0)

## 2016-08-04 LAB — IRON AND TIBC: IRON: 51 ug/dL (ref 45–182)

## 2016-08-04 LAB — CBC
HEMATOCRIT: 26.6 % — AB (ref 39.0–52.0)
HEMOGLOBIN: 8.6 g/dL — AB (ref 13.0–17.0)
MCH: 27.7 pg (ref 26.0–34.0)
MCHC: 32.3 g/dL (ref 30.0–36.0)
MCV: 85.5 fL (ref 78.0–100.0)
Platelets: 229 10*3/uL (ref 150–400)
RBC: 3.11 MIL/uL — AB (ref 4.22–5.81)
RDW: 19.8 % — ABNORMAL HIGH (ref 11.5–15.5)
WBC: 6.3 10*3/uL (ref 4.0–10.5)

## 2016-08-04 LAB — HEPARIN LEVEL (UNFRACTIONATED): HEPARIN UNFRACTIONATED: 0.57 [IU]/mL (ref 0.30–0.70)

## 2016-08-04 LAB — FOLATE: Folate: 6.8 ng/mL (ref >5.9)

## 2016-08-04 LAB — GLUCOSE, CAPILLARY
GLUCOSE-CAPILLARY: 115 mg/dL — AB (ref 65–99)
GLUCOSE-CAPILLARY: 140 mg/dL — AB (ref 65–99)
Glucose-Capillary: 102 mg/dL — ABNORMAL HIGH (ref 65–99)

## 2016-08-04 LAB — FERRITIN: Ferritin: 609 ng/mL — ABNORMAL HIGH (ref 24–336)

## 2016-08-04 LAB — VITAMIN B12: Vitamin B-12: 689 pg/mL (ref 180–914)

## 2016-08-04 MED ORDER — FOLIC ACID 5 MG/ML IJ SOLN
1.0000 mg | Freq: Every day | INTRAMUSCULAR | Status: DC
  Administered 2016-08-04 – 2016-08-07 (×3): 1 mg via INTRAVENOUS
  Filled 2016-08-04 (×4): qty 0.2

## 2016-08-04 MED ORDER — CLONIDINE HCL 0.3 MG/24HR TD PTWK
0.3000 mg | MEDICATED_PATCH | TRANSDERMAL | Status: DC
  Administered 2016-08-04: 0.3 mg via TRANSDERMAL
  Filled 2016-08-04: qty 1

## 2016-08-04 MED ORDER — HYDRALAZINE HCL 20 MG/ML IJ SOLN
20.0000 mg | Freq: Four times a day (QID) | INTRAMUSCULAR | Status: DC | PRN
  Administered 2016-08-05 – 2016-08-07 (×3): 20 mg via INTRAVENOUS
  Filled 2016-08-04 (×4): qty 1

## 2016-08-04 MED ORDER — LORAZEPAM 2 MG/ML IJ SOLN
2.0000 mg | Freq: Once | INTRAMUSCULAR | Status: AC
  Administered 2016-08-04: 2 mg via INTRAVENOUS
  Filled 2016-08-04: qty 1

## 2016-08-04 MED ORDER — CLONIDINE HCL 0.3 MG/24HR TD PTWK: 0.3000 mg | MEDICATED_PATCH | TRANSDERMAL | Status: DC

## 2016-08-04 NOTE — Progress Notes (Signed)
PROGRESS NOTE    Johnathan [name]  OVZ:[id number] DOB: 08-27-1970 DOA: 07/19/2016 PCP: [name], MD   Chief Complaint  Patient presents with  . Cellulitis     Brief Narrative:  HPI On 07/19/2016 by Dr. [name] [name] a 46 y.o.male,w Hep C?, IVDA, (recently injected opana) apparently c/o fever intermittently for the past 1 week, and redness over the dorsum of the right foot and slight swelling. Pt notes injecting Opana about 2 days ago. Pt denies any recent dental work. Pt presented to ED due to subjective fevers.  In ED, Pt found to have mild cellulitis of the right >left foot as well as uti wbc 6-30. Pt noted to be in ARF bun/creat 26/3.17, And to have abnormal LFT, Ast 47, Alt 25, Alk phos 206, T. Bili 1.6, Alb 2.4. And hyponatremia Na 123 , CXR right lung base slightly more patchy may reflect aspiration or pneumonia. Possible small right pleural effusion Pt will be admitted for sepsis secondary to uti vs cellulitis and ARF and hyponatremia. (note LP pending due to ? Neck stiffness)  Interim history Since his admission he has been found to have MSSA bacteremia, possible meningitis, and LE cellulitis. TTE showed EF 41-28%, grade 1 diastolic dysfunction, and tricuspid valve with a mobile vegetation. On 7/17 he developed acute encephalopathy with agitation, becoming belligerent and combative. He was placed on CIWA protocol, however patient was not responsive to ativan and required transfer to the ICU for a precedex gtt. S/p thoracentesis. Still remains somewhat lethargic. Has also had increased BP.   Assessment & Plan   RLL Cavitary pneumonia with pleural effusion -Septic emboli versus aspiration -PCCM consulted and following -Continue supplemental oxygen to maintain saturations above 92%.  -Patient currently maintaining 100% oxygen saturation on room air. -Interventional radiology consulted and appreciated, s/p Right thoracentesis which  yielded 1.5L of yellow fluid. -Fluid culture shows no growth to date. Gram stain shows no organisms -Fluid fungal and acid fast cultures pending  -Fluid cytology shows acute inflammation and reactive mesothelial cells. No malignant cells identified -Patient does have some periods of apnea. Discussed with respiratory therapy, at this time patient appears to be comfortable, oxygen saturations maintaining in the high 90s to monitor percent on room air. Discussed possible need for C Pap however doubt patient would keep mask in place.  Sepsis secondary to MSSA bacteremia/endocarditis of the tricuspid valve/Pseudomonas bacteremia -Patient did have positive blood cultures for Pseudomonas as well as MSSA -Infectious disease consulted and appreciated -Continue cefepime -Patient status post echocardiogram as well as TEE showing tricuspid valve endocarditis, EF of 78-67%, grade 1 diastolic dysfunction -Given patient's IV drug abuse, and history of injection, patient will need SNF versus prolonged hospital stay for antibiotics -Case management social work consulted -PICC line ordered, however if patient's mental status improves, and he opts to leave AMA, PICC should be REMOVED before he leaves given his history of IVDA  Acute encephalopathy/withdrawal -Patient did require ICU/Precedex drip for agitation and withdrawal -Continues to be somewhat lethargic, however was more alert this morning -CT head was obtained twice, unremarkable -MRI may be beneficial however doubt patient will stay still for this imaging. Discussed with mother, patient would likely need some form of sedation or anesthesia, will not pursue at this time. -Will continue to monitor closely. -Continue Ativan and Haldol as needed -Ammonia level obtained, WNL  Acute kidney injury -Secondary to sepsisand poor oral intake -Renal ultrasound showed mild right pelvicaliectasis with ureteral jet noted and urinary bladder excluding significant  ureteral obstruction -Creatinine on admission 3.17 -placed patient on gentle IV fluid 08/02/2016 given his creatinine increased to 1.46, creatinine mildly improved to 1.08 -Continue to monitor BMP  Accelerated/Uncontrolled Hypertension -Overnight, patient continued to have elevated BP, was placed on nitro paste -BP mildly improved -Will increased clonidine patch -Continue IV hydralazine PRN- will change SBP parameters  Illicit drug use/polysubstance abuse -Discussed cessation -Currently on Suboxone  Acute LUE DVT -continue heparin drip -Patient will need oral anticoagulation unable to tolerate  Hepatitis C -Will need outpatient follow-up to determine if patient is a candidate for therapy -LFTs appear to be stable  Moderate malnutrition/hypoalbuminemia -Nutrition consulted, continue supplements- however patient currently has poor oral intake over the past several days -continue D51/2 NS at 50cc per hour given his volume status -Speech therapy consulted as patient was choking with fluids on 08/01/16 -Speech therapy evaluate patient today and recommended nothing by mouth   Abnormal thyroid testing -Given clinical illness, would not start treatment at this time -TSH is 0.208, FT4 2.51 -Would repeat thyroid testing upon discharge  Cellulitis of the right lower extremity -Resolved  Normocytic anemia -Currently on heparin -hemoglobin was 11 at the beginning of his admission, suspect drop in hemoglobin due to dilutional component as patient received IVF for sepsis and AKI -Hemoglobin currently 8.6 -Will obtain anemia panel and FOBT if possible (patient currently with no oral intake secondary to mental status/lethargy) -Continue to monitor CBC  DVT Prophylaxis  heparin  Code Status: Full  Family Communication: None at bedside. Left a message for mother via phone.  Disposition Plan: To be determined. Currently in stepdown. Pending improvement in mental status. Will likely need  to continue admission until antibiotics are completed  Consultants Infectious disease PCCM Cardiology Interventional radiology  Procedures  Lumbar puncture Renal US Echocardiogram TEE Lower extremity Doppler Left upper extremity Doppler US Guided Right thoracentesis  PICC line placement   Antibiotics   Anti-infectives    Start     Dose/Rate Route Frequency Ordered Stop   07/22/16 1400  ceFEPIme (MAXIPIME) 2 g in dextrose 5 % 50 mL IVPB     2 g 100 mL/hr over 30 Minutes Intravenous Every 8 hours 07/22/16 1122 09/01/16 0259   07/20/16 1500  ceFEPIme (MAXIPIME) 2 g in dextrose 5 % 50 mL IVPB  Status:  Discontinued     2 g 100 mL/hr over 30 Minutes Intravenous Every 12 hours 07/20/16 1410 07/22/16 1122   07/19/16 2300  vancomycin (VANCOCIN) IVPB 750 mg/150 ml premix  Status:  Discontinued     750 mg 150 mL/hr over 60 Minutes Intravenous Every 24 hours 07/19/16 0237 07/19/16 0953   07/19/16 2200  ceFEPIme (MAXIPIME) 2 g in dextrose 5 % 50 mL IVPB  Status:  Discontinued     2 g 100 mL/hr over 30 Minutes Intravenous Every 24 hours 07/19/16 1931 07/20/16 1410   07/19/16 2000  nafcillin 2 g in dextrose 5 % 100 mL IVPB  Status:  Discontinued     2 g 200 mL/hr over 30 Minutes Intravenous Every 4 hours 07/19/16 1915 07/22/16 1130   07/19/16 1915  nafcillin injection 2 g  Status:  Discontinued     2 g Intravenous Every 4 hours 07/19/16 1912 07/19/16 1915   07/19/16 1800  ceFEPIme (MAXIPIME) 2 g in dextrose 5 % 50 mL IVPB  Status:  Discontinued     2 g 100 mL/hr over 30 Minutes Intravenous Every 24 hours 07/19/16 1646 07/19/16 1912   07/19/16 1100  vancomycin (VANCOCIN) 500 mg in sodium chloride 0.9 % 100 mL IVPB  Status:  Discontinued     500 mg 100 mL/hr over 60 Minutes Intravenous Every 12 hours 07/19/16 0953 07/19/16 1645   07/19/16 0800  piperacillin-tazobactam (ZOSYN) IVPB 3.375 g  Status:  Discontinued     3.375 g 12.5 mL/hr over 240 Minutes Intravenous Every 8 hours 07/19/16  0239 07/19/16 1645   07/19/16 0200  cefTRIAXone (ROCEPHIN) 2 g in dextrose 5 % 50 mL IVPB     2 g 100 mL/hr over 30 Minutes Intravenous  Once 07/19/16 0140 07/19/16 0259   07/19/16 0130  cefTRIAXone (ROCEPHIN) 1 g in dextrose 5 % 50 mL IVPB  Status:  Discontinued     1 g 100 mL/hr over 30 Minutes Intravenous  Once 07/19/16 0125 07/19/16 0140   07/19/16 0115  piperacillin-tazobactam (ZOSYN) IVPB 3.375 g     3.375 g 100 mL/hr over 30 Minutes Intravenous  Once 07/19/16 0109 07/19/16 0219   07/19/16 0115  vancomycin (VANCOCIN) IVPB 1000 mg/200 mL premix     1,000 mg 200 mL/hr over 60 Minutes Intravenous  Once 07/19/16 0109 07/19/16 0243      Subjective:   Marius Ditch seen and examined today.  Minimally interactive this morning. States he is tired.   Objective:   Vitals:   08/04/16 0600 08/04/16 0750 08/04/16 0800 08/04/16 0958  BP: (!) 147/64 (!) 167/73 (!) 160/70 (!) 180/103  Pulse: 79 64 61 89  Resp: (!) '8 10 14 14  ' Temp:  (!) 96.3 F (35.7 C)    TempSrc:  Oral    SpO2: 98% 99% 98% 100%  Weight:      Height:        Intake/Output Summary (Last 24 hours) at 08/04/16 1026 Last data filed at 08/04/16 1002  Gross per 24 hour  Intake          2054.47 ml  Output              500 ml  Net          1554.47 ml   Filed Weights   08/02/16 0300 08/03/16 0217 08/04/16 0200  Weight: 77.1 kg (170 lb) 77.1 kg (170 lb) 79.8 kg (176 lb)   Exam  General: Well developed, ill-appearing, no apparent distress  HEENT: NCAT, mucous membranes moist.   Cardiovascular: S1 S2 auscultated, RRR, +SEM  Respiratory: Clear to auscultation bilaterally with equal chest rise  Abdomen: Soft, nontender, nondistended, + bowel sounds  Extremities: warm dry without cyanosis clubbing. +2Upper/lower ext edema  Neuro: Awakens, however does not follow commands or respond to many questions. Moves all extremities with ease.   Skin: Dry, peeling skin  Data Reviewed: I have personally reviewed  following labs and imaging studies  CBC:  Recent Labs Lab 07/31/16 0701 08/01/16 0337 08/02/16 0339 08/03/16 0403 08/04/16 0352  WBC 8.0 9.4 8.7 8.0 6.3  HGB 9.9* 9.9* 10.4* 9.4* 8.6*  HCT 30.5* 30.8* 31.9* 28.3* 26.6*  MCV 87.6 87.5 86.2 85.8 85.5  PLT 292 312 316 258 419   Basic Metabolic Panel:  Recent Labs Lab 07/31/16 0531 08/01/16 0337 08/02/16 0339 08/03/16 0403 08/04/16 0352  NA 140 140 142 140 139  K 4.6 4.9 4.7 4.1 3.7  CL 117* 119* 119* 120* 120*  CO2 16* 14* 14* 15* 16*  GLUCOSE 84 73 76 124* 254*  BUN 40* 44* 46* 48* 42*  CREATININE 1.18 1.35* 1.46* 1.30* 1.08  CALCIUM 8.5* 8.8* 9.3  9.0 8.6*   GFR: Estimated Creatinine Clearance: 92 mL/min (by C-G formula based on SCr of 1.08 mg/dL). Liver Function Tests:  Recent Labs Lab 07/30/16 2124  AST 21  ALT 13*  ALKPHOS 98  BILITOT 1.0  PROT 6.8  ALBUMIN 1.4*   No results for input(s): LIPASE, AMYLASE in the last 168 hours.  Recent Labs Lab 08/02/16 1131  AMMONIA 28   Coagulation Profile: No results for input(s): INR, PROTIME in the last 168 hours. Cardiac Enzymes: No results for input(s): CKTOTAL, CKMB, CKMBINDEX, TROPONINI in the last 168 hours. BNP (last 3 results) No results for input(s): PROBNP in the last 8760 hours. HbA1C: No results for input(s): HGBA1C in the last 72 hours. CBG:  Recent Labs Lab 08/02/16 1713 08/03/16 0810 08/03/16 1219 08/03/16 1650 08/04/16 0758  GLUCAP 91 111* 117* 114* 102*   Lipid Profile: No results for input(s): CHOL, HDL, LDLCALC, TRIG, CHOLHDL, LDLDIRECT in the last 72 hours. Thyroid Function Tests: No results for input(s): TSH, T4TOTAL, FREET4, T3FREE, THYROIDAB in the last 72 hours. Anemia Panel:  Recent Labs  08/04/16 0705  VITAMINB12 689  FERRITIN 609*  TIBC NOT CALCULATED  IRON 51  RETICCTPCT 2.8   Urine analysis:    Component Value Date/Time   COLORURINE AMBER (A) 07/28/2016 1622   APPEARANCEUR TURBID (A) 07/28/2016 1622    LABSPEC 1.034 (H) 07/28/2016 1622   PHURINE 5.0 07/28/2016 1622   GLUCOSEU 50 (A) 07/28/2016 1622   HGBUR LARGE (A) 07/28/2016 1622   BILIRUBINUR SMALL (A) 07/28/2016 1622   KETONESUR 5 (A) 07/28/2016 1622   PROTEINUR >=300 (A) 07/28/2016 1622   NITRITE NEGATIVE 07/28/2016 1622   LEUKOCYTESUR NEGATIVE 07/28/2016 1622   Sepsis Labs: '@LABRCNTIP' (procalcitonin:4,lacticidven:4)  ) Recent Results (from the past 240 hour(s))  Gram stain     Status: None   Collection Time: 08/01/16  3:09 PM  Result Value Ref Range Status   Specimen Description FLUID RIGHT PLEURAL  Final   Special Requests NONE  Final   Gram Stain   Final    ABUNDANT WBC PRESENT,BOTH PMN AND MONONUCLEAR NO ORGANISMS SEEN    Report Status 08/01/2016 FINAL  Final  Acid Fast Smear (AFB)     Status: None   Collection Time: 08/01/16  3:09 PM  Result Value Ref Range Status   AFB Specimen Processing Concentration  Final   Acid Fast Smear Negative  Final    Comment: (NOTE) Performed At: [facility] [address] [place], [place] [id number] Lindon Romp MD [id number]    Source (AFB) FLUID  Final    Comment: RIGHT PLEURAL   Culture, body fluid-bottle     Status: None (Preliminary result)   Collection Time: 08/01/16  3:09 PM  Result Value Ref Range Status   Specimen Description FLUID RIGHT PLEURAL  Final   Special Requests BOTTLES DRAWN AEROBIC AND ANAEROBIC 10CC  Final   Culture NO GROWTH 2 DAYS  Final   Report Status PENDING  Incomplete      Radiology Studies: No results found.   Scheduled Meds: . buprenorphine-naloxone  1 tablet Sublingual Daily  . Chlorhexidine Gluconate Cloth  6 each Topical Daily  . cloNIDine  0.2 mg Transdermal Weekly  . feeding supplement  1 Container Oral TID BM  . folic acid  1 mg Oral Daily  . metoprolol tartrate  5 mg Intravenous Q6H  . nitroGLYCERIN  1 inch Topical Q6H  . sodium chloride flush  10-40 mL Intracatheter Q12H  . sodium chloride  flush  3 mL  Intravenous Q12H   Continuous Infusions: . ceFEPime (MAXIPIME) IV Stopped (08/04/16 0314)  . dextrose 5 % and 0.45% NaCl 50 mL/hr at 08/03/16 2308  . heparin 1,750 Units/hr (08/04/16 0410)     LOS: 16 days   Time Spent in minutes   45 minutes  Harsimran Westman D.O. on 08/04/2016 at 10:26 AM  Between 7am to 7pm - Pager - [phone]  After 7pm go to [email or web address] - password [email or web address]  And look for the night coverage person covering for me after hours  [facility] Office  [phone]

## 2016-08-04 NOTE — Progress Notes (Signed)
Text page night coverage about pt's temp being 95.2 rectal. Patient's core & extremities feel warm to touch -update on pt's condition given  orders received MD made aware that pt will not keep a gown on - blankets were placed from blanket warmer and room thermostat was adjusted all the way up with door closed most of the way - left open a little for safety due to pt trying to get up frequently. Pt still has tele sitter and bed alarm on.

## 2016-08-05 LAB — CBC
HCT: 26.4 % — ABNORMAL LOW (ref 39.0–52.0)
Hemoglobin: 8.5 g/dL — ABNORMAL LOW (ref 13.0–17.0)
MCH: 28 pg (ref 26.0–34.0)
MCHC: 32.2 g/dL (ref 30.0–36.0)
MCV: 86.8 fL (ref 78.0–100.0)
Platelets: 203 10*3/uL (ref 150–400)
RBC: 3.04 MIL/uL — ABNORMAL LOW (ref 4.22–5.81)
RDW: 20.3 % — AB (ref 11.5–15.5)
WBC: 7 10*3/uL (ref 4.0–10.5)

## 2016-08-05 LAB — GLUCOSE, CAPILLARY
GLUCOSE-CAPILLARY: 110 mg/dL — AB (ref 65–99)
Glucose-Capillary: 96 mg/dL (ref 65–99)
Glucose-Capillary: 99 mg/dL (ref 65–99)

## 2016-08-05 LAB — BASIC METABOLIC PANEL
Anion gap: 3 — ABNORMAL LOW (ref 5–15)
BUN: 42 mg/dL — AB (ref 6–20)
CHLORIDE: 122 mmol/L — AB (ref 101–111)
CO2: 17 mmol/L — ABNORMAL LOW (ref 22–32)
CREATININE: 1.23 mg/dL (ref 0.61–1.24)
Calcium: 8.9 mg/dL (ref 8.9–10.3)
GFR calc Af Amer: >60 mL/min (ref >60)
Glucose, Bld: 109 mg/dL — ABNORMAL HIGH (ref 65–99)
Potassium: 3.7 mmol/L (ref 3.5–5.1)
SODIUM: 142 mmol/L (ref 135–145)

## 2016-08-05 LAB — HEPARIN LEVEL (UNFRACTIONATED): HEPARIN UNFRACTIONATED: 0.6 [IU]/mL (ref 0.30–0.70)

## 2016-08-05 MED ORDER — LORAZEPAM 2 MG/ML IJ SOLN: 0.0000 mg | Freq: Two times a day (BID) | INTRAMUSCULAR | Status: DC | PRN

## 2016-08-05 NOTE — Progress Notes (Signed)
Patient sitting in chair side of bed for 30 mins with posey belt on loosely for safety.  Patient ate 5 bites of potatoes and meats for lunch without difficulty.  #2 CHG bath given to patient and he stated "this feels good" cleaning his feet and hands.

## 2016-08-05 NOTE — Progress Notes (Signed)
  Speech Language Pathology Treatment: Dysphagia  Patient Details Name: Johnathan [name] MRN: [id number] DOB: [birth year] Today's Date: 08/05/2016 Time: [id number] SLP Time Calculation (min) (ACUTE ONLY): 8 min  Assessment / Plan / Recommendation Clinical Impression  RN tech finishing feeding breakfast when SLP arrived on unit. RN stated pt required Ativan this morning. SLP observed cough x 1 via straw due to copious intake per tech. Consumed cup sip orange juice without overt s/s aspiration and attempted one bite grits however pt did not open his mouth due to lethargy and stopped session. Recommend full liquids only when adequately alert, small sips, crush meds and full supervision.    HPI HPI: Johnathan [name] a 45 y.o.male admitted with sepsis secondary to UTI vs cellulitis and ARF and hyponatremia(note LP pending due to ? Neck stiffness). PMH:w Hep C?, IVDA, (recently injected opana), c/o fever intermittently and redness over the dorsum of the right foot and slight swelling. Per chart pt notes injecting Opana about 2 days prior to admit. Pt seen for BSE 7/13 without indications of oropharyngeal dysphagia; shallow respiratory pattern; regular diet/thin recommended and signed off. Transferred to ICU for Precedex due to ETOH withdrawal 7/17. Developed right cavitary PNA from septic emboli with right pleural effusion. Regular diet order however pt has been lethargic.      SLP Plan  Continue with current plan of care       Recommendations  Diet recommendations: Thin liquid (full liquids) Liquids provided via: Cup;No straw Medication Administration: Crushed with puree Supervision: Staff to assist with self feeding;Full supervision/cueing for compensatory strategies Compensations: Slow rate;Small sips/bites Postural Changes and/or Swallow Maneuvers: Seated upright 90 degrees                Oral Care Recommendations: Oral care QID Follow up Recommendations: Skilled Nursing  facility SLP Visit Diagnosis: Dysphagia, unspecified (R13.10) Plan: Continue with current plan of care       GO                Johnathan [name], Johnathan [name] 08/05/2016, 9:53 AM  Johnathan CoonsLisa [name] Lonell FaceLitaker M.Ed [employer]SLP Pager [phone]

## 2016-08-05 NOTE — Progress Notes (Signed)
PROGRESS NOTE    Johnathan [name]  LFY:[id number] DOB: 01-24-1970 DOA: 07/19/2016 PCP: [name], MD   Chief Complaint  Patient presents with  . Cellulitis     Brief Narrative:  HPI On 07/19/2016 by Dr. [name] Johnathan [name] a 46 y.o.male,w Hep C?, IVDA, (recently injected opana) apparently c/o fever intermittently for the past 1 week, and redness over the dorsum of the right foot and slight swelling. Pt notes injecting Opana about 2 days ago. Pt denies any recent dental work. Pt presented to ED due to subjective fevers.  In ED, Pt found to have mild cellulitis of the right >left foot as well as uti wbc 6-30. Pt noted to be in ARF bun/creat 26/3.17, And to have abnormal LFT, Ast 47, Alt 25, Alk phos 206, T. Bili 1.6, Alb 2.4. And hyponatremia Na 123 , CXR right lung base slightly more patchy may reflect aspiration or pneumonia. Possible small right pleural effusion Pt will be admitted for sepsis secondary to uti vs cellulitis and ARF and hyponatremia. (note LP pending due to ? Neck stiffness)  Interim history Since his admission he has been found to have MSSA bacteremia, possible meningitis, and LE cellulitis. TTE showed EF 85-27%, grade 1 diastolic dysfunction, and tricuspid valve with a mobile vegetation. On 7/17 he developed acute encephalopathy with agitation, becoming belligerent and combative. He was placed on CIWA protocol, however patient was not responsive to ativan and required transfer to the ICU for a precedex gtt. S/p thoracentesis. Still remains somewhat lethargic. Has also had increased BP.   Assessment & Plan   RLL Cavitary pneumonia with pleural effusion -Septic emboli versus aspiration -PCCM consulted and following -Continue supplemental oxygen to maintain saturations above 92%.  -Patient currently maintaining 100% oxygen saturation on room air. -Interventional radiology consulted and appreciated, s/p Right thoracentesis which  yielded 1.5L of yellow fluid. -Fluid culture shows no growth to date. Gram stain shows no organisms -Fluid fungal and acid fast cultures pending  -Fluid cytology shows acute inflammation and reactive mesothelial cells. No malignant cells identified -Respiratory status appears to have improved  Sepsis secondary to MSSA bacteremia/endocarditis of the tricuspid valve/Pseudomonas bacteremia -Patient did have positive blood cultures for Pseudomonas as well as MSSA -Infectious disease consulted and appreciated -Continue cefepime -Patient status post echocardiogram as well as TEE showing tricuspid valve endocarditis, EF of 78-24%, grade 1 diastolic dysfunction -Given patient's IV drug abuse, and history of injection, patient will need SNF versus prolonged hospital stay for antibiotics -Case management social work consulted -PICC line ordered, however if patient's mental status improves, and he opts to leave AMA, PICC should be REMOVED before he leaves given his history of IVDA  Acute encephalopathy/withdrawal -Patient did require ICU/Precedex drip for agitation and withdrawal -CT head was obtained twice, unremarkable -MRI may be beneficial however doubt patient will stay still for this imaging. Discussed with mother, patient would likely need some form of sedation or anesthesia, will not pursue at this time. -Continue Ativan and Haldol as needed -Ammonia level obtained, WNL -Mental status waxes and wanes, appears to be improve as the day progresses -Continue to monitor closely  -Suspect patient patient may have some effects of benzo withdrawal?  Acute kidney injury -Secondary to sepsisand poor oral intake -Renal ultrasound showed mild right pelvicaliectasis with ureteral jet noted and urinary bladder excluding significant ureteral obstruction -Creatinine on admission 3.17 -placed patient on gentle IV fluid 08/02/2016 given his creatinine increased to 1.46, creatinine mildly improved to  1.23 -Continue to monitor  BMP  Accelerated/Uncontrolled Hypertension -Overnight, patient continued to have elevated BP, was placed on nitro paste -BP mildly improved, currently 158/93 -Clonidine patch was increased 7/29 -Continue IV metoprolol -Continue IV hydralazine PRN- will change SBP parameters -Suspect when patient is able to tolerate oral intake, BP will be better controlled   Illicit drug use/polysubstance abuse -Discussed cessation -Currently on Suboxone  Acute LUE DVT -continue heparin drip -Patient will need oral anticoagulation unable to tolerate  Hepatitis C -Will need outpatient follow-up to determine if patient is a candidate for therapy -LFTs appear to be stable  Moderate malnutrition/hypoalbuminemia -Nutrition consulted, continue supplements- however patient currently has poor oral intake over the past several days -Speech therapy consulted as patient was choking with fluids on 08/01/16 -Speech therapy evaluate patient today and recommended continuing current are -Discussed with RN, will place on soft diet and monitor closely. Was able to tolerate full liquids this morning. -Will discontinue IVF  Abnormal thyroid testing -Given clinical illness, would not start treatment at this time -TSH is 0.208, FT4 2.51 -Would repeat thyroid testing upon discharge  Cellulitis of the right lower extremity -Resolved  Normocytic anemia -Currently on heparin -hemoglobin was 11 at the beginning of his admission, suspect drop in hemoglobin due to dilutional component as patient received IVF for sepsis and AKI -Hemoglobin currently 8.5 -Anemia panel obtained: iron 51, ferritin 609 -FOBT ordered if possible (has not had much oral intake) -Continue to monitor CBC  DVT Prophylaxis  heparin  Code Status: Full  Family Communication: None at bedside. Left a message for mother via phone.  Disposition Plan: To be determined. Currently in stepdown. Pending improvement in mental  status. Will likely need to continue admission until antibiotics are completed  Consultants Infectious disease PCCM Cardiology Interventional radiology  Procedures  Lumbar puncture Renal US Echocardiogram TEE Lower extremity Doppler Left upper extremity Doppler US Guided Right thoracentesis  PICC line placement   Antibiotics   Anti-infectives    Start     Dose/Rate Route Frequency Ordered Stop   07/22/16 1400  ceFEPIme (MAXIPIME) 2 g in dextrose 5 % 50 mL IVPB     2 g 100 mL/hr over 30 Minutes Intravenous Every 8 hours 07/22/16 1122 09/01/16 0259   07/20/16 1500  ceFEPIme (MAXIPIME) 2 g in dextrose 5 % 50 mL IVPB  Status:  Discontinued     2 g 100 mL/hr over 30 Minutes Intravenous Every 12 hours 07/20/16 1410 07/22/16 1122   07/19/16 2300  vancomycin (VANCOCIN) IVPB 750 mg/150 ml premix  Status:  Discontinued     750 mg 150 mL/hr over 60 Minutes Intravenous Every 24 hours 07/19/16 0237 07/19/16 0953   07/19/16 2200  ceFEPIme (MAXIPIME) 2 g in dextrose 5 % 50 mL IVPB  Status:  Discontinued     2 g 100 mL/hr over 30 Minutes Intravenous Every 24 hours 07/19/16 1931 07/20/16 1410   07/19/16 2000  nafcillin 2 g in dextrose 5 % 100 mL IVPB  Status:  Discontinued     2 g 200 mL/hr over 30 Minutes Intravenous Every 4 hours 07/19/16 1915 07/22/16 1130   07/19/16 1915  nafcillin injection 2 g  Status:  Discontinued     2 g Intravenous Every 4 hours 07/19/16 1912 07/19/16 1915   07/19/16 1800  ceFEPIme (MAXIPIME) 2 g in dextrose 5 % 50 mL IVPB  Status:  Discontinued     2 g 100 mL/hr over 30 Minutes Intravenous Every 24 hours 07/19/16 1646 07/19/16 1912  07/19/16 1100  vancomycin (VANCOCIN) 500 mg in sodium chloride 0.9 % 100 mL IVPB  Status:  Discontinued     500 mg 100 mL/hr over 60 Minutes Intravenous Every 12 hours 07/19/16 0953 07/19/16 1645   07/19/16 0800  piperacillin-tazobactam (ZOSYN) IVPB 3.375 g  Status:  Discontinued     3.375 g 12.5 mL/hr over 240 Minutes  Intravenous Every 8 hours 07/19/16 0239 07/19/16 1645   07/19/16 0200  cefTRIAXone (ROCEPHIN) 2 g in dextrose 5 % 50 mL IVPB     2 g 100 mL/hr over 30 Minutes Intravenous  Once 07/19/16 0140 07/19/16 0259   07/19/16 0130  cefTRIAXone (ROCEPHIN) 1 g in dextrose 5 % 50 mL IVPB  Status:  Discontinued     1 g 100 mL/hr over 30 Minutes Intravenous  Once 07/19/16 0125 07/19/16 0140   07/19/16 0115  piperacillin-tazobactam (ZOSYN) IVPB 3.375 g     3.375 g 100 mL/hr over 30 Minutes Intravenous  Once 07/19/16 0109 07/19/16 0219   07/19/16 0115  vancomycin (VANCOCIN) IVPB 1000 mg/200 mL premix     1,000 mg 200 mL/hr over 60 Minutes Intravenous  Once 07/19/16 0109 07/19/16 0243      Subjective:   Johnathan [name] seen and examined today.  More alert today however does not say much. Denies current pain. Per RN, patient was trying to get out of bed overnight.   Objective:   Vitals:   08/05/16 0652 08/05/16 0655 08/05/16 0741 08/05/16 0827  BP: (!) 187/111 (!) 187/111 (!) 158/93   Pulse: 98  (!) 53 (!) 105  Resp: 13  (!) 27   Temp:   98.5 F (36.9 C)   TempSrc:   Oral   SpO2: 98%  98%   Weight:      Height:        Intake/Output Summary (Last 24 hours) at 08/05/16 1030 Last data filed at 08/05/16 0600  Gross per 24 hour  Intake          2406.25 ml  Output              200 ml  Net          2206.25 ml   Filed Weights   08/03/16 0217 08/04/16 0200 08/05/16 0300  Weight: 77.1 kg (170 lb) 79.8 kg (176 lb) 78.9 kg (174 lb)   Exam  General: Well developed, well nourished, NAD, appears stated age  HEENT: NCAT, mucous membranes moist.   Cardiovascular: S1 S2 auscultated, 1/6 SEM, RRR  Respiratory: Diminished but clear  Abdomen: Soft, nontender, nondistended, + bowel sounds  Extremities: warm dry without cyanosis clubbing. +edema in UE/LE   Neuro: Awake, alert, does not answer many questions at this time. Moves all extremities  Data Reviewed: I have personally reviewed  following labs and imaging studies  CBC:  Recent Labs Lab 08/01/16 0337 08/02/16 0339 08/03/16 0403 08/04/16 0352 08/05/16 0450  WBC 9.4 8.7 8.0 6.3 7.0  HGB 9.9* 10.4* 9.4* 8.6* 8.5*  HCT 30.8* 31.9* 28.3* 26.6* 26.4*  MCV 87.5 86.2 85.8 85.5 86.8  PLT 312 316 258 229 865   Basic Metabolic Panel:  Recent Labs Lab 08/01/16 0337 08/02/16 0339 08/03/16 0403 08/04/16 0352 08/05/16 0450  NA 140 142 140 139 142  K 4.9 4.7 4.1 3.7 3.7  CL 119* 119* 120* 120* 122*  CO2 14* 14* 15* 16* 17*  GLUCOSE 73 76 124* 254* 109*  BUN 44* 46* 48* 42* 42*  CREATININE 1.35* 1.46* 1.30*  1.08 1.23  CALCIUM 8.8* 9.3 9.0 8.6* 8.9   GFR: Estimated Creatinine Clearance: 80.8 mL/min (by C-G formula based on SCr of 1.23 mg/dL). Liver Function Tests:  Recent Labs Lab 07/30/16 2124  AST 21  ALT 13*  ALKPHOS 98  BILITOT 1.0  PROT 6.8  ALBUMIN 1.4*   No results for input(s): LIPASE, AMYLASE in the last 168 hours.  Recent Labs Lab 08/02/16 1131  AMMONIA 28   Coagulation Profile: No results for input(s): INR, PROTIME in the last 168 hours. Cardiac Enzymes: No results for input(s): CKTOTAL, CKMB, CKMBINDEX, TROPONINI in the last 168 hours. BNP (last 3 results) No results for input(s): PROBNP in the last 8760 hours. HbA1C: No results for input(s): HGBA1C in the last 72 hours. CBG:  Recent Labs Lab 08/03/16 1650 08/04/16 0758 08/04/16 1201 08/04/16 1958 08/05/16 0744  GLUCAP 114* 102* 115* 140* 110*   Lipid Profile: No results for input(s): CHOL, HDL, LDLCALC, TRIG, CHOLHDL, LDLDIRECT in the last 72 hours. Thyroid Function Tests: No results for input(s): TSH, T4TOTAL, FREET4, T3FREE, THYROIDAB in the last 72 hours. Anemia Panel:  Recent Labs  08/04/16 0705  VITAMINB12 689  FOLATE 6.8  FERRITIN 609*  TIBC NOT CALCULATED  IRON 51  RETICCTPCT 2.8   Urine analysis:    Component Value Date/Time   COLORURINE AMBER (A) 07/28/2016 1622   APPEARANCEUR TURBID (A)  07/28/2016 1622   LABSPEC 1.034 (H) 07/28/2016 1622   PHURINE 5.0 07/28/2016 1622   GLUCOSEU 50 (A) 07/28/2016 1622   HGBUR LARGE (A) 07/28/2016 1622   BILIRUBINUR SMALL (A) 07/28/2016 1622   KETONESUR 5 (A) 07/28/2016 1622   PROTEINUR >=300 (A) 07/28/2016 1622   NITRITE NEGATIVE 07/28/2016 1622   LEUKOCYTESUR NEGATIVE 07/28/2016 1622   Sepsis Labs: '@LABRCNTIP' (procalcitonin:4,lacticidven:4)  ) Recent Results (from the past 240 hour(s))  Gram stain     Status: None   Collection Time: 08/01/16  3:09 PM  Result Value Ref Range Status   Specimen Description FLUID RIGHT PLEURAL  Final   Special Requests NONE  Final   Gram Stain   Final    ABUNDANT WBC PRESENT,BOTH PMN AND MONONUCLEAR NO ORGANISMS SEEN    Report Status 08/01/2016 FINAL  Final  Acid Fast Smear (AFB)     Status: None   Collection Time: 08/01/16  3:09 PM  Result Value Ref Range Status   AFB Specimen Processing Concentration  Final   Acid Fast Smear Negative  Final    Comment: (NOTE) Performed At: [facility] [address] [place], [place] [id number] Lindon Romp MD [id number]    Source (AFB) FLUID  Final    Comment: RIGHT PLEURAL   Culture, body fluid-bottle     Status: None (Preliminary result)   Collection Time: 08/01/16  3:09 PM  Result Value Ref Range Status   Specimen Description FLUID RIGHT PLEURAL  Final   Special Requests BOTTLES DRAWN AEROBIC AND ANAEROBIC 10CC  Final   Culture NO GROWTH 3 DAYS  Final   Report Status PENDING  Incomplete      Radiology Studies: No results found.   Scheduled Meds: . buprenorphine-naloxone  1 tablet Sublingual Daily  . Chlorhexidine Gluconate Cloth  6 each Topical Daily  . cloNIDine  0.3 mg Transdermal Weekly  . feeding supplement  1 Container Oral TID BM  . folic acid  1 mg Intravenous Daily  . metoprolol tartrate  5 mg Intravenous Q6H  . nitroGLYCERIN  1 inch Topical Q6H  . sodium chloride  flush  10-40 mL Intracatheter Q12H  . sodium  chloride flush  3 mL Intravenous Q12H   Continuous Infusions: . ceFEPime (MAXIPIME) IV Stopped (08/05/16 0405)  . dextrose 5 % and 0.45% NaCl 1,000 mL (08/04/16 1947)  . heparin 1,750 Units/hr (08/05/16 0520)     LOS: 17 days   Time Spent in minutes   45 minutes  Johnathan [name] D.O. on 08/05/2016 at 10:30 AM  Between 7am to 7pm - Pager - [phone]  After 7pm go to [email or web address] - password [email or web address]  And look for the night coverage person covering for me after hours  [facility] Office  [phone]

## 2016-08-05 NOTE — [place] FL2 (Signed)
[place] MEDICAID FL2 LEVEL OF CARE SCREENING TOOL     IDENTIFICATION  Patient Name: Johnathan [name] Birthdate: 02-14-1970 Sex: male Admission Date (Current Location): 07/19/2016  [facility] [place]   Facility and Address:  [facility], [address], [place], [place] [address]      Provider Number: [id number]  Attending Physician Name and Address:  Edsel PetrinMikhail, Maryann, DO  Relative Name and Phone Number:       Current Level of Care: Hospital Recommended Level of Care: Skilled Nursing Facility Prior Approval Number:    Date Approved/Denied:   PASRR Number: [id number]  Discharge Plan: Hospital    Current Diagnoses: Patient Active Problem List   Diagnosis Date Noted  . Pleural effusion   . Opiate withdrawal (HCC)   . Bacteremia 07/23/2016  . Acute encephalopathy   . Alcohol withdrawal syndrome with complication (HCC)   . 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 Bacteremia due to Pseudomonas 07/20/2016  . Sepsis (HCC) 07/19/2016  . UTI (urinary tract infection) 07/19/2016  . ARF (acute renal failure) (HCC) 07/19/2016  . Hyponatremia 07/19/2016  . Abnormal liver function 07/19/2016  . IVDU (intravenous drug user) 07/19/2016  . MSSA bacteremia 07/19/2016  . Meningitis 07/19/2016  . Pneumonia 07/19/2016  . AKI (acute kidney injury) (HCC)   . Chronic hepatitis C (HCC) 09/14/2014  . Substance abuse 09/14/2014  . Cigarette smoker 09/14/2014  . Poor dentition 09/14/2014    Orientation RESPIRATION BLADDER Height & Weight     Self  O2 (2L [place]) Incontinent, Indwelling catheter Weight: 174 lb (78.9 kg) Height:  5\' 11"  (180.3 cm)  BEHAVIORAL SYMPTOMS/MOOD NEUROLOGICAL BOWEL NUTRITION STATUS      Incontinent Diet (see DC summary)  AMBULATORY STATUS COMMUNICATION OF NEEDS Skin   Extensive Assist Verbally Normal                       Personal Care Assistance Level of Assistance  Bathing, Feeding, Dressing  Bathing Assistance: Maximum assistance Feeding assistance: Maximum assistance Dressing Assistance: Maximum assistance     Functional Limitations Info             SPECIAL CARE FACTORS FREQUENCY  PT (By licensed PT), OT (By licensed OT)     PT Frequency: 5/wk OT Frequency: 5/wk            Contractures      Additional Factors Info  Code Status, Allergies Code Status Info: FULL Allergies Info: NKA           Current Medications (08/05/2016):  This is the current hospital active medication list Current Facility-Administered Medications  Medication Dose Route Frequency Provider Last Rate Last Dose  . acetaminophen (TYLENOL) tablet 650 mg  650 mg Oral Q6H PRN Pearson GrippeKim, James, MD   650 mg at 07/21/16 1034   Or  . acetaminophen (TYLENOL) suppository 650 mg  650 mg Rectal Q6H PRN Pearson GrippeKim, James, MD      . albuterol (PROVENTIL) (2.5 MG/3ML) 0.083% nebulizer solution 2.5 mg  2.5 mg Nebulization Q2H PRN Bevelyn NgoGroce, Sarah F, NP   2.5 mg at 07/30/16 0750  . buprenorphine-naloxone (SUBOXONE) 8-2 mg per SL tablet 1 tablet  1 tablet Sublingual Daily Oretha MilchAlva, Rakesh V, MD   1 tablet at 08/04/16 1000  . ceFEPIme (MAXIPIME) 2 g in dextrose 5 % 50 mL IVPB  2 g Intravenous Q8H Gardiner Barefootomer, Robert W, MD   Stopped at 08/05/16 0405  . Chlorhexidine Gluconate Cloth 2 %  PADS 6 each  6 each Topical Daily Edsel PetrinMikhail, Maryann, DO   6 each at 08/03/16 1000  . cloNIDine (CATAPRES - Dosed in mg/24 hr) patch 0.3 mg  0.3 mg Transdermal Weekly [name], Maryann, DO   0.3 mg at 08/04/16 1322  . dextrose 5 %-0.45 % sodium chloride infusion   Intravenous Continuous Edsel PetrinMikhail, Maryann, DO 50 mL/hr at 08/04/16 1947 1,000 mL at 08/04/16 1947  . feeding supplement (BOOST / RESOURCE BREEZE) liquid 1 Container  1 Container Oral TID BM Lonia BloodMcClung, Jeffrey T, MD   1 Container at 08/04/16 2016  . fentaNYL (SUBLIMAZE) injection 25 mcg  25 mcg Intravenous Q2H PRN Lonia BloodMcClung, Jeffrey T, MD   25 mcg at 08/04/16 1448  . folic acid injection 1 mg  1 mg  Intravenous Daily [name], [place]rest[place]Maryann, DO   1 mg at 08/04/16 1319  . haloperidol lactate (HALDOL) injection 2 mg  2 mg Intravenous Q6H PRN Lonia BloodMcClung, Jeffrey T, MD   2 mg at 08/04/16 1105  . heparin ADULT infusion 100 units/mL (25000 units/24450mL sodium chloride 0.45%)  1,750 Units/hr Intravenous Continuous Bajbus, Lauren D, RPH 17.5 mL/hr at 08/05/16 0520 1,750 Units/hr at 08/05/16 0520  . hydrALAZINE (APRESOLINE) injection 20 mg  20 mg Intravenous Q6H PRN Edsel PetrinMikhail, Maryann, DO   20 mg at 08/05/16 [id number]  . LORazepam (ATIVAN) injection 0-4 mg  0-4 mg Intravenous Q12H PRN Edsel PetrinMikhail, Maryann, DO   2 mg at 08/05/16 [id number]  . metoprolol tartrate (LOPRESSOR) injection 5 mg  5 mg Intravenous Q6H Jerald Kiefhiu, Stephen K, MD   5 mg at 08/05/16 0551  . nitroGLYCERIN (NITROGLYN) 2 % ointment 1 inch  1 inch Topical Q6H Opyd, Lavone Neriimothy S, MD   1 inch at 08/05/16 0552  . ondansetron (ZOFRAN) injection 4 mg  4 mg Intravenous Q6H PRN Bevelyn NgoGroce, Sarah F, NP      . sodium chloride flush (NS) 0.9 % injection 10-40 mL  10-40 mL Intracatheter Q12H [name], [place]ann, DO   10 mL at 08/04/16 2126  . sodium chloride flush (NS) 0.9 % injection 10-40 mL  10-40 mL Intracatheter PRN [name], Nita SellsMaryann, DO      . sodium chloride flush (NS) 0.9 % injection 3 mL  3 mL Intravenous Q12H Pearson GrippeKim, James, MD   3 mL at 08/04/16 2126     Discharge Medications: Please see discharge summary for a list of discharge medications.  Relevant Imaging Results:  Relevant Lab Results:   Additional Information SS#: [id number]; IV drug user requiring IV cefepime through Aug 25th  Teodoro Jeffreys, [place] H, [place]

## 2016-08-05 NOTE — Progress Notes (Addendum)
Pt's BP 180/103 - Given PRN dose of Apresoline IV - see MAR - BP at 1100 was 141/84

## 2016-08-05 NOTE — Progress Notes (Signed)
Nutrition Follow-up  DOCUMENTATION CODES:   Non-severe (moderate) malnutrition in context of chronic illness  INTERVENTION:    Continue Boost Breeze po TID, each supplement provides 250 kcal and 9 grams of protein   If PO intake does not improve, rec short term nutrition support via Cortrak small bore feeding tube  NUTRITION DIAGNOSIS:   Malnutrition (Moderate) related to chronic illness (polysubstance abuse) as evidenced by moderate depletion of body fat, moderate depletions of muscle mass, moderate to severe fluid accumulation, ongoing  GOAL:   Patient will meet greater than or equal to 90% of their needs, progressing  MONITOR:   Diet advancement, PO intake, Supplement acceptance, Labs, I & O's  ASSESSMENT:   46 yo male with PMH of substance abuse, tobacco use, IV drug abuse, who was admitted on 7/13 with MSSA bacteremia, endocarditis, PNA, cellulitis of RLE.  Pt sleeping upon visit. S/p bedside swallow evaluation 7/28.  Dysphagia tx today. Advanced to Full Liquids now Soft diet. PO intake has been 0% per flowsheet records. Per RN, he is drinking his Boost Breeze supplements. Medications reviewed and include folic acid. Labs reviewed. CBG's 115-140-110.  Diet Order:  DIET SOFT Room service appropriate? Yes; Fluid consistency: Thin  Skin:  Reviewed, no issues  Last BM:  7/29  Height:   Ht Readings from Last 1 Encounters:  08/05/16 5\' 11"  (1.803 m)   Weight:   Wt Readings from Last 1 Encounters:  08/05/16 174 lb (78.9 kg)   Ideal Body Weight:  78.2 kg  BMI:  Body mass index is 24.27 kg/m.  Estimated Nutritional Needs:   Kcal:  2100-2300  Protein:  100-115 gm  Fluid:  2.1 L  EDUCATION NEEDS:   No education needs identified at this time  Maureen ChattersKatie Isam Unrein, RD, LDN Pager #: [phone] After-Hours Pager #: [phone]

## 2016-08-05 NOTE — Progress Notes (Signed)
ANTICOAGULATION CONSULT NOTE  Pharmacy Consult for heparin Indication: DVT  No Known Allergies  Patient Measurements: Height: 5\' 11"  (180.3 cm) Weight: 174 lb (78.9 kg) IBW/kg (Calculated) : 75.3 Heparin Dosing Weight: 76.7kg  Vital Signs: Temp: 98.5 F (36.9 C) (07/30 0741) Temp Source: Oral (07/30 0741) BP: 158/93 (07/30 0741) Pulse Rate: 105 (07/30 0827)  Labs:  Recent Labs  08/03/16 0403 08/04/16 0352 08/05/16 0450  HGB 9.4* 8.6* 8.5*  HCT 28.3* 26.6* 26.4*  PLT 258 229 203  HEPARINUNFRC 0.35 0.57 0.60  CREATININE 1.30* 1.08 1.23    Estimated Creatinine Clearance: 80.8 mL/min (by C-G formula based on SCr of 1.23 mg/dL).   Assessment: 45 YOM not on anticoagulation PTA who is being treated for endocarditis. Now with small segment of acute DVT in radial veins of left forearm.  Heparin level remains therapeutic (0.60) on 1750 units/hr.   Hgb 8.5, plts 203- Slight drop from yesterday. No signs of bleeding have been noted.    Goal of Therapy:  Heparin level 0.3-0.7 units/ml Monitor platelets by anticoagulation protocol: Yes   Plan:  Continue heparin at 1750 units/hr Daily Heparin Level and CBC Follow s/s bleeding and long term anticoagulation plans  Sharin MonsEmily Tsugio Elison, PharmD PGY2 Infectious Diseases Pharmacy Resident  Pager: [phone] 08/05/2016 10:16 AM

## 2016-08-05 NOTE — Plan of Care (Signed)
Problem: Nutrition: Goal: Adequate nutrition will be maintained Outcome: Progressing Pt. stated he was hungry and would also like something to drink. He drank all of his Boost that was due at 2000 and about 30 min later drank half of an Ensure.

## 2016-08-05 NOTE — Progress Notes (Signed)
    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  Interval History: No new positive cultures, no acute events  Physical Exam: Constitutional:  Vitals:   08/05/16 0741 08/05/16 0827  BP: (!) 158/93   Pulse: (!) 53 (!) 105  Resp: (!) 27   Temp: 98.5 F (36.9 C)    patient  Is sleeping, getting picc line Respiratory: Normal respiratory effort; CTA B Cardiovascular: RRR   Review of Systems: Unable to assess due to mental status  Lab Results  Component Value Date   WBC 7.0 08/05/2016   HGB 8.5 (L) 08/05/2016   HCT 26.4 (L) 08/05/2016   MCV 86.8 08/05/2016   PLT 203 08/05/2016    Lab Results  Component Value Date   CREATININE 1.23 08/05/2016   BUN 42 (H) 08/05/2016   NA 142 08/05/2016   K 3.7 08/05/2016   CL 122 (H) 08/05/2016   CO2 17 (L) 08/05/2016    Lab Results  Component Value Date   ALT 13 (L) 07/30/2016   AST 21 07/30/2016   ALKPHOS 98 07/30/2016     Microbiology: Recent Results (from the past 240 hour(s))  Gram stain     Status: None   Collection Time: 08/01/16  3:09 PM  Result Value Ref Range Status   Specimen Description FLUID RIGHT PLEURAL  Final   Special Requests NONE  Final   Gram Stain   Final    ABUNDANT WBC PRESENT,BOTH PMN AND MONONUCLEAR NO ORGANISMS SEEN    Report Status 08/01/2016 FINAL  Final  Acid Fast Smear (AFB)     Status: None   Collection Time: 08/01/16  3:09 PM  Result Value Ref Range Status   AFB Specimen Processing Concentration  Final   Acid Fast Smear Negative  Final    Comment: (NOTE) Performed At: [facility] [address] [place], [place] [id number] Mila HomerHancock William F MD NW:[id number]:[phone]    Source (AFB) FLUID  Final    Comment: RIGHT PLEURAL   Fungus Culture With Stain     Status: None (Preliminary result)   Collection Time: 08/01/16  3:09 PM  Result Value Ref Range Status   Fungus Stain Final report  Final    Comment: (NOTE) Performed At: [facility]  [place] [address] [place], [place] [id number] Mila HomerHancock William F MD BM:[id number]:[phone]    Fungus (Mycology) Culture PENDING  Incomplete   Fungal Source FLUID  Final    Comment: RIGHT PLEURAL   Culture, body fluid-bottle     Status: None (Preliminary result)   Collection Time: 08/01/16  3:09 PM  Result Value Ref Range Status   Specimen Description FLUID RIGHT PLEURAL  Final   Special Requests BOTTLES DRAWN AEROBIC AND ANAEROBIC 10CC  Final   Culture NO GROWTH 3 DAYS  Final   Report Status PENDING  Incomplete  Fungus Culture Result     Status: None   Collection Time: 08/01/16  3:09 PM  Result Value Ref Range Status   Result 1 Comment  Final    Comment: (NOTE) KOH/Calcofluor preparation:  no fungus observed. Performed At: 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  continue long-term cefepime through August 25th.   2.  Substance abuse - on suboxone and will need to continue.

## 2016-08-06 LAB — BASIC METABOLIC PANEL
Anion gap: 3 — ABNORMAL LOW (ref 5–15)
BUN: 42 mg/dL — AB (ref 6–20)
CHLORIDE: 120 mmol/L — AB (ref 101–111)
CO2: 17 mmol/L — ABNORMAL LOW (ref 22–32)
Calcium: 9 mg/dL (ref 8.9–10.3)
Creatinine, Ser: 1.36 mg/dL — ABNORMAL HIGH (ref 0.61–1.24)
GFR calc Af Amer: >60 mL/min (ref >60)
GFR calc non Af Amer: >60 mL/min (ref >60)
Glucose, Bld: 114 mg/dL — ABNORMAL HIGH (ref 65–99)
POTASSIUM: 3.8 mmol/L (ref 3.5–5.1)
SODIUM: 140 mmol/L (ref 135–145)

## 2016-08-06 LAB — GLUCOSE, CAPILLARY
GLUCOSE-CAPILLARY: 103 mg/dL — AB (ref 65–99)
GLUCOSE-CAPILLARY: 105 mg/dL — AB (ref 65–99)
GLUCOSE-CAPILLARY: 85 mg/dL (ref 65–99)
Glucose-Capillary: 79 mg/dL (ref 65–99)

## 2016-08-06 LAB — CBC
HEMATOCRIT: 26.6 % — AB (ref 39.0–52.0)
Hemoglobin: 8.6 g/dL — ABNORMAL LOW (ref 13.0–17.0)
MCH: 28.7 pg (ref 26.0–34.0)
MCHC: 32.3 g/dL (ref 30.0–36.0)
MCV: 88.7 fL (ref 78.0–100.0)
Platelets: 176 10*3/uL (ref 150–400)
RBC: 3 MIL/uL — ABNORMAL LOW (ref 4.22–5.81)
RDW: 21.4 % — ABNORMAL HIGH (ref 11.5–15.5)
WBC: 7.6 10*3/uL (ref 4.0–10.5)

## 2016-08-06 LAB — CULTURE, BODY FLUID-BOTTLE

## 2016-08-06 LAB — CULTURE, BODY FLUID W GRAM STAIN -BOTTLE: Culture: NO GROWTH

## 2016-08-06 LAB — HEPARIN LEVEL (UNFRACTIONATED): Heparin Unfractionated: 0.64 IU/mL (ref 0.30–0.70)

## 2016-08-06 MED ORDER — LORAZEPAM 2 MG/ML IJ SOLN
0.0000 mg | Freq: Four times a day (QID) | INTRAMUSCULAR | Status: DC | PRN
  Administered 2016-08-06 – 2016-08-08 (×7): 2 mg via INTRAVENOUS
  Filled 2016-08-06 (×7): qty 1

## 2016-08-06 NOTE — Progress Notes (Signed)
  Speech Language Pathology Treatment: Dysphagia  Patient Details Name: Johnathan [name] MRN: [id number] DOB: 08/03/1970 Today's Date: 08/06/2016 Time: [id number] SLP Time Calculation (min) (ACUTE ONLY): 11 min  Assessment / Plan / Recommendation Clinical Impression  Initially pt adequately awake. Ten minutes prior, pt attempting to stand upright in bed and required Ativan. Consumed several straw sips thin with tactile assist for small sips. Slow and prolonged mastication with egg and pt became sleepy. Cleared oral cavity and stopped session for pt safety. Diet texture changed to soft yesterday. If he is adequately alert soft should be appropriate however if lethargic he may have ineffective mastication. Will continue to follow.    HPI HPI: [name] a 45 y.o.male admitted with sepsis secondary to UTI vs cellulitis and ARF and hyponatremia(note LP pending due to ? Neck stiffness). PMH:w Hep C?, IVDA, (recently injected opana), c/o fever intermittently and redness over the dorsum of the right foot and slight swelling. Per chart pt notes injecting Opana about 2 days prior to admit. Pt seen for BSE 7/13 without indications of oropharyngeal dysphagia; shallow respiratory pattern; regular diet/thin recommended and signed off. Transferred to ICU for Precedex due to ETOH withdrawal 7/17. Developed right cavitary PNA from septic emboli with right pleural effusion. Regular diet order however pt has been lethargic.      SLP Plan  Continue with current plan of care       Recommendations  Diet recommendations: Thin liquid (soft ) Liquids provided via: Cup;Straw Medication Administration: Crushed with puree Supervision: Staff to assist with self feeding;Full supervision/cueing for compensatory strategies Compensations: Slow rate;Small sips/bites;Lingual sweep for clearance of pocketing Postural Changes and/or Swallow Maneuvers: Seated upright 90 degrees                Oral  Care Recommendations: Oral care BID Follow up Recommendations: Skilled Nursing facility SLP Visit Diagnosis: Dysphagia, unspecified (R13.10) Plan: Continue with current plan of care       GO                Johnathan [name], Johnathan [name] 08/06/2016, 9:42 AM   Johnathan [name] M.Ed [employer] Pager [phone]

## 2016-08-06 NOTE — Progress Notes (Signed)
PROGRESS NOTE    Johnathan [name]  MMN:[id number] DOB: [birth year] DOA: 07/19/2016 PCP: Johnathan Spanish, MD   Chief Complaint  Patient presents with  . Cellulitis     Brief Narrative:  HPI On 07/19/2016 by Dr. [name] [name] a 46 y.o.male,w Hep C?, IVDA, (recently injected opana) apparently c/o fever intermittently for the past 1 week, and redness over the dorsum of the right foot and slight swelling. Pt notes injecting Opana about 2 days ago. Pt denies any recent dental work. Pt presented to ED due to subjective fevers.  In ED, Pt found to have mild cellulitis of the right >left foot as well as uti wbc 6-30. Pt noted to be in ARF bun/creat 26/3.17, And to have abnormal LFT, Ast 47, Alt 25, Alk phos 206, T. Bili 1.6, Alb 2.4. And hyponatremia Na 123 , CXR right lung base slightly more patchy may reflect aspiration or pneumonia. Possible small right pleural effusion Pt will be admitted for sepsis secondary to uti vs cellulitis and ARF and hyponatremia. (note LP pending due to ? Neck stiffness)  Interim history Since his admission he has been found to have MSSA bacteremia, possible meningitis, and LE cellulitis. TTE showed EF 90-38%, grade 1 diastolic dysfunction, and tricuspid valve with a mobile vegetation. On 7/17 he developed acute encephalopathy with agitation, becoming belligerent and combative. He was placed on CIWA protocol, however patient was not responsive to ativan and required transfer to the ICU for a precedex gtt. S/p thoracentesis. Mental status improving slowly. Has also had increased BP.   Assessment & Plan   RLL Cavitary pneumonia with pleural effusion -Septic emboli versus aspiration -PCCM consulted and following -Continue supplemental oxygen to maintain saturations above 92%.  -Patient currently maintaining 100% oxygen saturation on room air. -Interventional radiology consulted and appreciated, s/p Right thoracentesis which yielded  1.5L of yellow fluid. -Fluid culture shows no growth to date. Gram stain shows no organisms -Fluid fungal and acid fast cultures pending  -Fluid cytology shows acute inflammation and reactive mesothelial cells. 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 Discussed with mother, patient would likely need some form of sedation or anesthesia, will not pursue at this time. -Continue Ativan and Haldol as needed- will increase Ativan CIWA to q6PRN instead of q12 -Ammonia level obtained, WNL -Continue to monitor closely  -Suspect patient patient may have some effects of benzo withdrawal? -Mental status appears to be improving very slowly  Acute kidney injury -Secondary to sepsisand poor oral intake -Renal ultrasound showed mild right pelvicaliectasis with ureteral jet noted and urinary bladder excluding significant ureteral obstruction -Creatinine on admission 3.17 -placed patient on gentle IV fluid 08/02/2016 given his creatinine increased to 1.46, creatinine mildly  improved to  1.36 -Continue to monitor BMP  Accelerated/Uncontrolled Hypertension -Overnight, patient continued to have elevated BP, was placed on nitro paste -BP mildly improved, currently 158/93 -Clonidine patch was increased 7/29 -Continue IV metoprolol, PRN hydralazine -Suspect when patient is able to tolerate oral intake, BP will be better controlled   Illicit drug use/polysubstance abuse -Discussed cessation -Currently on Suboxone  Acute LUE DVT -continue heparin drip -Patient will need oral anticoagulation unable to tolerate  Hepatitis C -Will need outpatient follow-up to determine if patient is a candidate for therapy -LFTs appear to be stable  Moderate malnutrition/hypoalbuminemia -Nutrition consulted, continue supplements- however patient currently has poor oral intake over the past several days -Speech therapy consulted -Discontinued IVF -Placed on soft diet and will continue to monitor closely, patient able to eat   Abnormal thyroid testing -Given clinical illness, would not start treatment at this time -TSH is 0.208, FT4 2.51 -Would repeat thyroid testing upon discharge  Cellulitis of the right lower extremity -Resolved  Normocytic anemia -Currently on heparin -hemoglobin was 11 at the beginning of his admission, suspect drop in hemoglobin due to dilutional component as patient received IVF for sepsis and AKI -Hemoglobin currently 8.6 -Anemia panel obtained: iron 51, ferritin 609 -FOBT ordered if possible (has not had much oral intake) -Continue to monitor CBC  DVT Prophylaxis  heparin  Code Status: Full  Family Communication: None at bedside.   Disposition Plan: To be determined. Currently in stepdown. Pending improvement in mental status. Will likely need to continue admission until antibiotics are completed  Consultants Infectious disease PCCM Cardiology Interventional radiology  Procedures  Lumbar puncture Renal  US Echocardiogram TEE Lower extremity Doppler Left upper extremity Doppler US Guided Right thoracentesis  PICC line placement   Antibiotics   Anti-infectives    Start     Dose/Rate Route Frequency Ordered Stop   07/22/16 1400  ceFEPIme (MAXIPIME) 2 g in dextrose 5 % 50 mL IVPB     2 g 100 mL/hr over 30 Minutes Intravenous Every 8 hours 07/22/16 1122 09/01/16 0259   07/20/16 1500  ceFEPIme (MAXIPIME) 2 g in dextrose 5 % 50 mL IVPB  Status:  Discontinued     2 g 100 mL/hr over 30 Minutes Intravenous Every 12 hours 07/20/16 1410 07/22/16 1122   07/19/16 2300  vancomycin (VANCOCIN) IVPB 750 mg/150 ml premix  Status:  Discontinued     750 mg 150 mL/hr over 60 Minutes Intravenous Every 24 hours 07/19/16 0237 07/19/16 0953   07/19/16 2200  ceFEPIme (MAXIPIME) 2 g in dextrose 5 % 50 mL IVPB  Status:  Discontinued     2 g 100 mL/hr over 30 Minutes Intravenous Every 24 hours 07/19/16 1931 07/20/16 1410   07/19/16 2000  nafcillin 2 g in dextrose 5 % 100 mL IVPB  Status:  Discontinued     2 g 200 mL/hr over 30 Minutes Intravenous Every 4 hours 07/19/16 1915 07/22/16 1130   07/19/16 1915  nafcillin injection 2 g  Status:  Discontinued     2 g Intravenous Every 4 hours 07/19/16 1912 07/19/16 1915   07/19/16 1800  ceFEPIme (MAXIPIME) 2 g in dextrose 5 % 50 mL IVPB  Status:  Discontinued     2 g 100 mL/hr over 30 Minutes Intravenous Every 24 hours 07/19/16 1646 07/19/16 1912   07/19/16 1100  vancomycin (VANCOCIN) 500 mg in sodium chloride 0.9 % 100 mL IVPB  Status:  Discontinued     500 mg 100 mL/hr over 60 Minutes Intravenous  Every 12 hours 07/19/16 0953 07/19/16 1645   07/19/16 0800  piperacillin-tazobactam (ZOSYN) IVPB 3.375 g  Status:  Discontinued     3.375 g 12.5 mL/hr over 240 Minutes Intravenous Every 8 hours 07/19/16 0239 07/19/16 1645   07/19/16 0200  cefTRIAXone (ROCEPHIN) 2 g in dextrose 5 % 50 mL IVPB     2 g 100 mL/hr over 30 Minutes Intravenous  Once 07/19/16 0140 07/19/16 0259    07/19/16 0130  cefTRIAXone (ROCEPHIN) 1 g in dextrose 5 % 50 mL IVPB  Status:  Discontinued     1 g 100 mL/hr over 30 Minutes Intravenous  Once 07/19/16 0125 07/19/16 0140   07/19/16 0115  piperacillin-tazobactam (ZOSYN) IVPB 3.375 g     3.375 g 100 mL/hr over 30 Minutes Intravenous  Once 07/19/16 0109 07/19/16 0219   07/19/16 0115  vancomycin (VANCOCIN) IVPB 1000 mg/200 mL premix     1,000 mg 200 mL/hr over 60 Minutes Intravenous  Once 07/19/16 0109 07/19/16 0243      Subjective:   Johnathan [name] seen and examined today.  More interactive today. Denies chest pain, shortness of breath, abdominal pain. Complains of general pain. Per RN, some agitation early this morning.    Objective:   Vitals:   08/06/16 0013 08/06/16 0100 08/06/16 0522 08/06/16 0745  BP:  (!) 155/93 (!) 151/80 (!) 159/88  Pulse:  94 (!) 101 67  Resp:  '12 11 14  ' Temp: 97.6 F (36.4 C)  (!) 97.5 F (36.4 C) (!) 97.5 F (36.4 C)  TempSrc: Axillary  Axillary Oral  SpO2:  96%  100%  Weight:   80.3 kg (177 lb)   Height:   '5\' 11"'  (1.803 m)     Intake/Output Summary (Last 24 hours) at 08/06/16 1244 Last data filed at 08/06/16 1000  Gross per 24 hour  Intake              246 ml  Output              425 ml  Net             -179 ml   Filed Weights   08/04/16 0200 08/05/16 0300 08/06/16 0522  Weight: 79.8 kg (176 lb) 78.9 kg (174 lb) 80.3 kg (177 lb)   Exam  General: Well developed, well nourished, NAD, appears stated age  HEENT: NCAT, mucous membranes moist.   Cardiovascular: S1 S2 auscultated, 1/6 SEM, RRR  Respiratory: Diminished but clear  Abdomen: Soft, nontender, nondistended, + bowel sounds  Extremities: warm dry without cyanosis clubbing. +edema in UE/LE   Neuro: Awake, alert, does not answer many questions at this time. Moves all extremities  Data Reviewed: I have personally reviewed following labs and imaging studies  CBC:  Recent Labs Lab 08/02/16 0339 08/03/16 0403  08/04/16 0352 08/05/16 0450 08/06/16 0543  WBC 8.7 8.0 6.3 7.0 7.6  HGB 10.4* 9.4* 8.6* 8.5* 8.6*  HCT 31.9* 28.3* 26.6* 26.4* 26.6*  MCV 86.2 85.8 85.5 86.8 88.7  PLT 316 258 229 203 295   Basic Metabolic Panel:  Recent Labs Lab 08/02/16 0339 08/03/16 0403 08/04/16 0352 08/05/16 0450 08/06/16 0543  NA 142 140 139 142 140  K 4.7 4.1 3.7 3.7 3.8  CL 119* 120* 120* 122* 120*  CO2 14* 15* 16* 17* 17*  GLUCOSE 76 124* 254* 109* 114*  BUN 46* 48* 42* 42* 42*  CREATININE 1.46* 1.30* 1.08 1.23 1.36*  CALCIUM 9.3 9.0 8.6* 8.9 9.0  GFR: Estimated Creatinine Clearance: 73.1 mL/min (A) (by C-G formula based on SCr of 1.36 mg/dL (H)). Liver Function Tests:  Recent Labs Lab 07/30/16 2124  AST 21  ALT 13*  ALKPHOS 98  BILITOT 1.0  PROT 6.8  ALBUMIN 1.4*   No results for input(s): LIPASE, AMYLASE in the last 168 hours.  Recent Labs Lab 08/02/16 1131  AMMONIA 28   Coagulation Profile: No results for input(s): INR, PROTIME in the last 168 hours. Cardiac Enzymes: No results for input(s): CKTOTAL, CKMB, CKMBINDEX, TROPONINI in the last 168 hours. BNP (last 3 results) No results for input(s): PROBNP in the last 8760 hours. HbA1C: No results for input(s): HGBA1C in the last 72 hours. CBG:  Recent Labs Lab 08/05/16 0744 08/05/16 1234 08/05/16 1600 08/06/16 0740 08/06/16 1211  GLUCAP 110* 96 99 103* 105*   Lipid Profile: No results for input(s): CHOL, HDL, LDLCALC, TRIG, CHOLHDL, LDLDIRECT in the last 72 hours. Thyroid Function Tests: No results for input(s): TSH, T4TOTAL, FREET4, T3FREE, THYROIDAB in the last 72 hours. Anemia Panel:  Recent Labs  08/04/16 0705  VITAMINB12 689  FOLATE 6.8  FERRITIN 609*  TIBC NOT CALCULATED  IRON 51  RETICCTPCT 2.8   Urine analysis:    Component Value Date/Time   COLORURINE AMBER (A) 07/28/2016 1622   APPEARANCEUR TURBID (A) 07/28/2016 1622   LABSPEC 1.034 (H) 07/28/2016 1622   PHURINE 5.0 07/28/2016 1622    GLUCOSEU 50 (A) 07/28/2016 1622   HGBUR LARGE (A) 07/28/2016 1622   BILIRUBINUR SMALL (A) 07/28/2016 1622   KETONESUR 5 (A) 07/28/2016 1622   PROTEINUR >=300 (A) 07/28/2016 1622   NITRITE NEGATIVE 07/28/2016 1622   LEUKOCYTESUR NEGATIVE 07/28/2016 1622   Sepsis Labs: '@LABRCNTIP' (procalcitonin:4,lacticidven:4)  ) Recent Results (from the past 240 hour(s))  Gram stain     Status: None   Collection Time: 08/01/16  3:09 PM  Result Value Ref Range Status   Specimen Description FLUID RIGHT PLEURAL  Final   Special Requests NONE  Final   Gram Stain   Final    ABUNDANT WBC PRESENT,BOTH PMN AND MONONUCLEAR NO ORGANISMS SEEN    Report Status 08/01/2016 FINAL  Final  Acid Fast Smear (AFB)     Status: None   Collection Time: 08/01/16  3:09 PM  Result Value Ref Range Status   AFB Specimen Processing Concentration  Final   Acid Fast Smear Negative  Final    Comment: (NOTE) Performed At: [facility] [place], [place] [id number] Lindon Romp MD WU:[id number]    Source (AFB) FLUID  Final    Comment: RIGHT PLEURAL   Fungus Culture With Stain     Status: None (Preliminary result)   Collection Time: 08/01/16  3:09 PM  Result Value Ref Range Status   Fungus Stain Final report  Final    Comment: (NOTE) Performed At: [facility] [place], [place] [id number] Lindon Romp MD VH:[id number]    Fungus (Mycology) Culture PENDING  Incomplete   Fungal Source FLUID  Final    Comment: RIGHT PLEURAL   Culture, body fluid-bottle     Status: None (Preliminary result)   Collection Time: 08/01/16  3:09 PM  Result Value Ref Range Status   Specimen Description FLUID RIGHT PLEURAL  Final   Special Requests BOTTLES DRAWN AEROBIC AND ANAEROBIC 10CC  Final   Culture NO GROWTH 4 DAYS  Final   Report Status PENDING  Incomplete  Fungus Culture Result     Status:  None   Collection Time: 08/01/16  3:09 PM  Result Value Ref Range Status   Result 1  Comment  Final    Comment: (NOTE) KOH/Calcofluor preparation:  no fungus observed. Performed At: [facility] [address] [place], [place] [id number] Lindon Romp MD OH:[id number]       Radiology Studies: No results found.   Scheduled Meds: . buprenorphine-naloxone  1 tablet Sublingual Daily  . Chlorhexidine Gluconate Cloth  6 each Topical Daily  . cloNIDine  0.3 mg Transdermal Weekly  . feeding supplement  1 Container Oral TID BM  . folic acid  1 mg Intravenous Daily  . metoprolol tartrate  5 mg Intravenous Q6H  . nitroGLYCERIN  1 inch Topical Q6H  . sodium chloride flush  10-40 mL Intracatheter Q12H  . sodium chloride flush  3 mL Intravenous Q12H   Continuous Infusions: . ceFEPime (MAXIPIME) IV 2 g (08/06/16 1155)  . heparin 1,750 Units/hr (08/06/16 1156)     LOS: 18 days   Time Spent in minutes   30 minutes  Kimari Coudriet D.O. on 08/06/2016 at 12:44 PM  Between 7am to 7pm - Pager - [phone]  After 7pm go to [email or web address] - password [email or web address]  And look for the night coverage person covering for me after hours  [facility] Office  [phone]

## 2016-08-06 NOTE — Progress Notes (Signed)
Occupational Therapy Treatment Patient Details Name: Johnathan HolesChristopher XXXPallas MRN: [id number] DOB: Sep 05, 1970 Today's Date: 08/06/2016    History of present illness Pt admitted 7/13 with sepsis, MSSA bacteremia, B LE celllulitis, tricuspid valve vegetation, R LL cavity PNA with pleural effusion, hep C. Developed encephalopathy and agitation and transferred to ICU 7/17. + DVT LUE. PMH: polysubstance abuse.   OT comments  Pt with increased level of alertness this visit and tolerance of activity, but continues to demonstrate significant impairment in cognition with intermittent agitation and resistance to assistance. Pt remains dependent in ADL and requires 2 person assistance for safety with all mobility. He was not able to ambulate this visit. Will follow.  Follow Up Recommendations  SNF;Supervision/Assistance - 24 hour    Equipment Recommendations       Recommendations for Other Services      Precautions / Restrictions Precautions Precautions: Fall Precaution Comments: pt easily agitated Restrictions Weight Bearing Restrictions: No       Mobility Bed Mobility Overal bed mobility: Needs Assistance Bed Mobility: Supine to Sit;Sit to Supine     Supine to sit: +2 for physical assistance;Mod assist Sit to supine: +2 for physical assistance;Total assist   General bed mobility comments: pt requiring moderate assistance for LEs off EOB and min assist to raise trunk into sitting, pt resistant to return to supine, requiring more assistance, pt with poor sequencing.  Transfers Overall transfer level: Needs assistance Equipment used: 2 person hand held assist Transfers: Sit to/from Stand Sit to Stand: +2 physical assistance;Min assist         General transfer comment: stood at EOB with assist for lines and balance, pt somewhat resistive to being assisted    Balance Overall balance assessment: Needs assistance   Sitting balance-Leahy Scale: Fair Sitting balance - Comments: pt  immediately standing upon sitting EOB, upon returning to sitting, rested head in hands and required min guard assist.     Standing balance-Leahy Scale: Poor Standing balance comment: posterior lean with stabilizing LEs on bed, stood several minutes with +2 min assist                            ADL either performed or assessed with clinical judgement   ADL Overall ADL's : Needs assistance/impaired Eating/Feeding: Bed level;Total assistance Eating/Feeding Details (indicate cue type and reason): to drink from straw                                   General ADL Comments: continues to requires total assist     Vision       Perception     Praxis      Cognition Arousal/Alertness: Awake/alert Behavior During Therapy: Flat affect;Agitated;Impulsive Overall Cognitive Status: Impaired/Different from baseline Area of Impairment: Attention;Following commands;Safety/judgement;Problem solving                   Current Attention Level: Focused   Following Commands: Follows one step commands with increased time;Follows one step commands inconsistently Safety/Judgement: Decreased awareness of safety;Decreased awareness of deficits   Problem Solving: Slow processing;Decreased initiation;Difficulty sequencing;Requires verbal cues;Requires tactile cues          Exercises     Shoulder Instructions       General Comments      Pertinent Vitals/ Pain       Pain Assessment: Faces Faces Pain Scale: No hurt  Home Living  Prior Functioning/Environment              Frequency  Min 2X/week        Progress Toward Goals  OT Goals(current goals can now be found in the care plan section)  Progress towards OT goals: Progressing toward goals  Acute Rehab OT Goals Patient Stated Goal: none stated as pt unable/no verbalizations OT Goal Formulation: Patient unable to participate in goal  setting Time For Goal Achievement: 08/15/16 Potential to Achieve Goals: Fair  Plan Discharge plan remains appropriate    Co-evaluation      Reason for Co-Treatment: For patient/therapist safety;Necessary to address cognition/behavior during functional activity   OT goals addressed during session: Strengthening/ROM      AM-PAC PT "6 Clicks" Daily Activity     Outcome Measure   Help from another person eating meals?: Total Help from another person taking care of personal grooming?: Total Help from another person toileting, which includes using toliet, bedpan, or urinal?: Total Help from another person bathing (including washing, rinsing, drying)?: Total Help from another person to put on and taking off regular upper body clothing?: Total Help from another person to put on and taking off regular lower body clothing?: Total 6 Click Score: 6    End of Session    OT Visit Diagnosis: Muscle weakness (generalized) (M62.81);Other symptoms and signs involving cognitive function   Activity Tolerance Treatment limited secondary to agitation   Patient Left in bed;with call bell/phone within reach;with bed alarm set   Nurse Communication Mobility status        Time: 9147-82951220-1238 OT Time Calculation (min): 18 min  Charges: OT General Charges $OT Visit: 1 Procedure OT Treatments $Therapeutic Activity: 8-22 mins    Evern BioMayberry, Venecia Mehl Lynn 08/06/2016, 1:30 PM  [phone]

## 2016-08-06 NOTE — Progress Notes (Signed)
Physical Therapy Treatment Patient Details Name: Johnathan HolesChristopher XXXPallas MRN: [id number] DOB: 1970/10/21 Today's Date: 08/06/2016    History of Present Illness Pt admitted 7/13 with sepsis, MSSA bacteremia, B LE celllulitis, tricuspid valve vegetation, R LL cavity PNA with pleural effusion, hep C. Developed encephalopathy and agitation and transferred to ICU 7/17. + DVT LUE. PMH: polysubstance abuse.    PT Comments    Pt with slow progress due to continued cognitive issues. Pt becomes easily resistant to assistance. Possibly hallucinating as pt reaching forward as to grab something without anything present.   Follow Up Recommendations  SNF     Equipment Recommendations  Other (comment) (TBA at next venue)    Recommendations for Other Services       Precautions / Restrictions Precautions Precautions: Fall Precaution Comments: pt easily agitated Restrictions Weight Bearing Restrictions: No    Mobility  Bed Mobility Overal bed mobility: Needs Assistance Bed Mobility: Supine to Sit;Sit to Supine     Supine to sit: +2 for physical assistance;Mod assist Sit to supine: +2 for physical assistance;Total assist   General bed mobility comments: pt requiring moderate assistance for LEs off EOB and min assist to raise trunk into sitting, pt resistant to return to supine, requiring more assistance, pt with poor sequencing.  Transfers Overall transfer level: Needs assistance Equipment used: 2 person hand held assist Transfers: Sit to/from Stand Sit to Stand: +2 physical assistance;Min assist         General transfer comment: Assist to bring hips up and for balance. Stood at [employer] with assist for lines and balance, pt somewhat resistive to being assisted  Ambulation/Gait                 Stairs            Wheelchair Mobility    Modified Rankin (Stroke Patients Only)       Balance Overall balance assessment: Needs assistance Sitting-balance support: No upper  extremity supported;Feet supported Sitting balance-Leahy Scale: Fair Sitting balance - Comments: pt immediately standing upon sitting EOB, upon returning to sitting, rested head in hands and required min guard assist.   Standing balance support: Single extremity supported Standing balance-Leahy Scale: Poor Standing balance comment: posterior lean with stabilizing LEs on bed, stood several minutes with +2 min assist                             Cognition Arousal/Alertness: Awake/alert Behavior During Therapy: Flat affect;Agitated;Impulsive Overall Cognitive Status: Impaired/Different from baseline Area of Impairment: Attention;Following commands;Safety/judgement;Problem solving                   Current Attention Level: Focused   Following Commands: Follows one step commands with increased time;Follows one step commands inconsistently Safety/Judgement: Decreased awareness of safety;Decreased awareness of deficits   Problem Solving: Slow processing;Decreased initiation;Difficulty sequencing;Requires verbal cues;Requires tactile cues        Exercises      General Comments        Pertinent Vitals/Pain Pain Assessment: Faces Faces Pain Scale: No hurt    Home Living                      Prior Function            PT Goals (current goals can now be found in the care plan section) Acute Rehab PT Goals Patient Stated Goal: none stated as pt unable/no verbalizations Progress towards PT goals: Progressing  toward goals (limited due to cognition)    Frequency    Min 2X/week      PT Plan Current plan remains appropriate;Frequency needs to be updated    Co-evaluation PT/OT/SLP Co-Evaluation/Treatment: Yes Reason for Co-Treatment: Necessary to address cognition/behavior during functional activity;For patient/therapist safety PT goals addressed during session: Mobility/safety with mobility;Balance OT goals addressed during session:  Strengthening/ROM      AM-PAC PT "6 Clicks" Daily Activity  Outcome Measure  Difficulty turning over in bed (including adjusting bedclothes, sheets and blankets)?: Total Difficulty moving from lying on back to sitting on the side of the bed? : Total Difficulty sitting down on and standing up from a chair with arms (e.g., wheelchair, bedside commode, etc,.)?: Total Help needed moving to and from a bed to chair (including a wheelchair)?: Total Help needed walking in hospital room?: Total Help needed climbing 3-5 steps with a railing? : Total 6 Click Score: 6    End of Session   Activity Tolerance: Treatment limited secondary to agitation (Pt easily irritated and becomes resistant to any assist) Patient left: in bed;with call bell/phone within reach;with bed alarm set   PT Visit Diagnosis: Unsteadiness on feet (R26.81);Other symptoms and signs involving the nervous system (R29.898);Muscle weakness (generalized) (M62.81)     Time: 5784-69621220-1238 PT Time Calculation (min) (ACUTE ONLY): 18 min  Charges:                       G Codes:       [facility] LoopCary Analissa Bayless PT [phone]    Angelina OkCary W [facility] HospitalMaycok 08/06/2016, 1:50 PM

## 2016-08-06 NOTE — Progress Notes (Signed)
ANTICOAGULATION CONSULT NOTE  Pharmacy Consult for heparin Indication: DVT  No Known Allergies  Patient Measurements: Height: 5\' 11"  (180.3 cm) Weight: 177 lb (80.3 kg) IBW/kg (Calculated) : 75.3 Heparin Dosing Weight: 76.7kg  Vital Signs: Temp: 97.5 F (36.4 C) (07/31 0745) Temp Source: Oral (07/31 0745) BP: 159/88 (07/31 0745) Pulse Rate: 67 (07/31 0745)  Labs:  Recent Labs  08/04/16 0352 08/05/16 0450 08/06/16 0543  HGB 8.6* 8.5* 8.6*  HCT 26.6* 26.4* 26.6*  PLT 229 203 176  HEPARINUNFRC 0.57 0.60 0.64  CREATININE 1.08 1.23 1.36*    Estimated Creatinine Clearance: 73.1 mL/min (A) (by C-G formula based on SCr of 1.36 mg/dL (H)).   Assessment: 45 YOM not on anticoagulation PTA who is being treated for endocarditis. Now with small segment of acute DVT in radial veins of left forearm.  Heparin level remains therapeutic (0.64) on 1750 units/hr.   Hgb 8.6, plts 176- stable from yesterday. No signs of bleeding have been noted.    Goal of Therapy:  Heparin level 0.3-0.7 units/ml Monitor platelets by anticoagulation protocol: Yes   Plan:  Continue heparin at 1750 units/hr Daily Heparin Level and CBC Follow s/s bleeding and long term anticoagulation plans  Estella HuskMichelle Oliviagrace Crisanti, Pharm.D., BCPS, AAHIVP Clinical Pharmacist Phone: [phone] or [phone] 08/06/2016, 9:59 AM

## 2016-08-07 DIAGNOSIS — J9601 Acute respiratory failure with hypoxia: Secondary | ICD-10-CM

## 2016-08-07 DIAGNOSIS — G931 Anoxic brain damage, not elsewhere classified: Secondary | ICD-10-CM

## 2016-08-07 LAB — BASIC METABOLIC PANEL
Anion gap: 6 (ref 5–15)
BUN: 42 mg/dL — AB (ref 6–20)
CALCIUM: 8.9 mg/dL (ref 8.9–10.3)
CO2: 15 mmol/L — ABNORMAL LOW (ref 22–32)
Chloride: 119 mmol/L — ABNORMAL HIGH (ref 101–111)
Creatinine, Ser: 1.46 mg/dL — ABNORMAL HIGH (ref 0.61–1.24)
GFR calc Af Amer: >60 mL/min (ref >60)
GFR, EST NON AFRICAN AMERICAN: 56 mL/min — AB (ref >60)
GLUCOSE: 90 mg/dL (ref 65–99)
POTASSIUM: 3.8 mmol/L (ref 3.5–5.1)
SODIUM: 140 mmol/L (ref 135–145)

## 2016-08-07 LAB — CBC
HEMATOCRIT: 27.2 % — AB (ref 39.0–52.0)
HEMOGLOBIN: 8.8 g/dL — AB (ref 13.0–17.0)
MCH: 28.3 pg (ref 26.0–34.0)
MCHC: 32.4 g/dL (ref 30.0–36.0)
MCV: 87.5 fL (ref 78.0–100.0)
Platelets: 150 10*3/uL (ref 150–400)
RBC: 3.11 MIL/uL — ABNORMAL LOW (ref 4.22–5.81)
RDW: 21.2 % — ABNORMAL HIGH (ref 11.5–15.5)
WBC: 6.4 10*3/uL (ref 4.0–10.5)

## 2016-08-07 LAB — GLUCOSE, CAPILLARY
Glucose-Capillary: 72 mg/dL (ref 65–99)
Glucose-Capillary: 72 mg/dL (ref 65–99)

## 2016-08-07 LAB — HEPARIN LEVEL (UNFRACTIONATED)
HEPARIN UNFRACTIONATED: 0.53 [IU]/mL (ref 0.30–0.70)
Heparin Unfractionated: 0.87 IU/mL — ABNORMAL HIGH (ref 0.30–0.70)

## 2016-08-07 MED ORDER — SODIUM CHLORIDE 0.9 % IV SOLN
INTRAVENOUS | Status: DC
  Administered 2016-08-07: 50 mL via INTRAVENOUS
  Administered 2016-08-09: via INTRAVENOUS

## 2016-08-07 MED ORDER — FOLIC ACID 1 MG PO TABS
1.0000 mg | ORAL_TABLET | Freq: Every day | ORAL | Status: DC
  Filled 2016-08-07: qty 1

## 2016-08-07 NOTE — Progress Notes (Signed)
ANTICOAGULATION CONSULT NOTE - Follow Up Consult  Pharmacy Consult for heparin Indication: DVT  Labs:  Recent Labs  08/05/16 0450 08/06/16 0543 08/07/16 0521  HGB 8.5* 8.6*  --   HCT 26.4* 26.6*  --   PLT 203 176  --   HEPARINUNFRC 0.60 0.64 0.87*  CREATININE 1.23 1.36*  --     Assessment: 46yo male now above goal on heparin after several levels at upper end of goal, no signs of bleeding per RN; of note labs are being drawn from PICC that heparin is going through, RN paused gtt and flushed line before drawing from other lumen.  Goal of Therapy:  Heparin level 0.3-0.7 units/ml   Plan:  Will decrease heparin gtt by 2 units/kg/hr to 1600 units/hr and check level in 6hr.  Vernard GamblesVeronda Zehava Turski, PharmD, BCPS  08/07/2016,5:51 AM

## 2016-08-07 NOTE — Progress Notes (Signed)
[facility]                                                                              Patient Demographics  Johnathan [name], is a 46 y.o. male, DOB - 02/18/1970, DXA:[id number]  Admit date - 07/19/2016   Admitting Physician [name], MD  Outpatient Primary MD for the patient is [name], MD  Outpatient specialists:   LOS - 19  days   Medical records reviewed and are as summarized below:    Chief Complaint  Patient presents with  . Cellulitis       Brief summary   HPI On 07/19/2016 by Dr. [name] [name] a 46 y.o.male,w Hep C?, IVDA, (recently injected opana) apparently c/o fever intermittently for the past 1 week, and redness over the dorsum of the right foot and slight swelling. Pt notes injecting Opana about 2 days ago. Pt denies any recent dental work. Pt presented to ED due to subjective fevers.  In ED, Pt found to have mild cellulitis of the right >left foot as well as uti wbc 6-30. Pt noted to be in ARF bun/creat 26/3.17, And to have abnormal LFT, Ast 47, Alt 25, Alk phos 206, T. Bili 1.6, Alb 2.4. And hyponatremia Na 123 , CXR right lung base slightly more patchy may reflect aspiration or pneumonia. Possible small right pleural effusion Pt will be admitted for sepsis secondary to uti vs cellulitis and ARF and hyponatremia. (note LP pending due to ? Neck stiffness) Since his admission he has been found to have MSSA bacteremia, possible meningitis, and LE cellulitis. TTE showed EF 20-94%, grade 1 diastolic dysfunction, and tricuspid valve with a mobile vegetation. On 7/17 he developed acute encephalopathy with agitation, becoming belligerent and combative. He was placed on CIWA protocol, however patient was not responsive to ativan and required transfer to the ICU for a precedex gtt. S/p thoracentesis.   Assessment & Plan     Right lung cavitary pneumonia with pleural effusion - Septic emboli versus  aspiration, PCCM and ID consulted. - IR consulted, status post right-sided thoracentesis, 1.5 L of yellow fluid removed - Fluid cultures with no growth, no organisms. Fluid fungal and acid-fast cultures negative so far - Cytology negative, respiratory status improving - O2 sats 98% on room air   MSSA bacteremia/endocarditis of the tricuspid valve, pseudomonas bacteremia - Positive blood cultures for pseudomonas and MSSA, ID following - Continue cefepime - 2-D echo, TEE showed tricuspid valve endocarditis, EF 70-96%, grade 1 diastolic dysfunction. - Given patient's IV drug abuse, patient will need prolonged hospital stay for antibiotics versus SNF. PICC ordered, however if patient chooses to leave AMA and is oriented, PICC line will need to be removed .  Acute encephalopathy/withdrawal - Currently still confused, he required acid Dexedrine for irritation and withdrawal in ICU. - CT head unremarkable x2, ammonia level normal - Continue Ativan and Haldol as needed, mental status appears to be improving slowly   Polysubstance abuse - Continue Suboxone for now  Acute kidney injury - Likely secondary to sepsis and dehydration. - Renal ultrasound showed mild right pelvic caliectasis with a ureteral jet noted and  urinary bladder excluding significant ureteral obstruction. Creatinine on admission 3.17 - Improved from admission however has been slowly trending up again, 1.46 today, place on gentle hydration at 50 mL an hour  Accelerated hypertension - Currently stable, continue metoprolol, Nitropaste, clonidine patch and hydralazine as needed  Acute LUE DVT - Continue heparin drip per pharmacy  Moderate malnutrition/hypoalbuminemia - Continue nutritional supplements, dysphagia diet  Abnormal thyroid testing - TSH is 0.20, T4 2.5 likely due to euthyroid sick syndrome due to acute critical illness - Follow up outpatient  Normocytic anemia - Currently on heparin drip, H&H stable, -  Ferritin 609, acute phase reactant, iron profile will need to be repeated again closer to discharge  Cellulitis of the right lower extremity - Resolved  History of hepatitis C - LFTs normal, patient should follow outpatient PCP and GI referral  Code Status: Full CODE STATUS DVT Prophylaxis: Heparin Family Communication: No family member at the bedside Disposition Plan:   Time Spent in minutes  25 minutes  Procedures:  Lumbar puncture Renal US Echocardiogram TEE Lower extremity Doppler Left upper extremity Doppler US Guided Right thoracentesis  PICC line placement   Consultants:   Infectious disease PCCM Cardiology Interventional radiology  Antimicrobials:   IV cefepime, 7/ 16>> 8/26  Medications  Scheduled Meds: . buprenorphine-naloxone  1 tablet Sublingual Daily  . Chlorhexidine Gluconate Cloth  6 each Topical Daily  . cloNIDine  0.3 mg Transdermal Weekly  . feeding supplement  1 Container Oral TID BM  . folic acid  1 mg Oral Daily  . metoprolol tartrate  5 mg Intravenous Q6H  . nitroGLYCERIN  1 inch Topical Q6H  . sodium chloride flush  10-40 mL Intracatheter Q12H  . sodium chloride flush  3 mL Intravenous Q12H   Continuous Infusions: . ceFEPime (MAXIPIME) IV 2 g (08/07/16 1110)  . heparin 1,600 Units/hr (08/07/16 0732)   PRN Meds:.acetaminophen **OR** acetaminophen, albuterol, fentaNYL (SUBLIMAZE) injection, haloperidol lactate, hydrALAZINE, LORazepam, ondansetron (ZOFRAN) IV, sodium chloride flush   Antibiotics   Anti-infectives    Start     Dose/Rate Route Frequency Ordered Stop   07/22/16 1400  ceFEPIme (MAXIPIME) 2 g in dextrose 5 % 50 mL IVPB     2 g 100 mL/hr over 30 Minutes Intravenous Every 8 hours 07/22/16 1122 09/01/16 0259   07/20/16 1500  ceFEPIme (MAXIPIME) 2 g in dextrose 5 % 50 mL IVPB  Status:  Discontinued     2 g 100 mL/hr over 30 Minutes Intravenous Every 12 hours 07/20/16 1410 07/22/16 1122   07/19/16 2300  vancomycin  (VANCOCIN) IVPB 750 mg/150 ml premix  Status:  Discontinued     750 mg 150 mL/hr over 60 Minutes Intravenous Every 24 hours 07/19/16 0237 07/19/16 0953   07/19/16 2200  ceFEPIme (MAXIPIME) 2 g in dextrose 5 % 50 mL IVPB  Status:  Discontinued     2 g 100 mL/hr over 30 Minutes Intravenous Every 24 hours 07/19/16 1931 07/20/16 1410   07/19/16 2000  nafcillin 2 g in dextrose 5 % 100 mL IVPB  Status:  Discontinued     2 g 200 mL/hr over 30 Minutes Intravenous Every 4 hours 07/19/16 1915 07/22/16 1130   07/19/16 1915  nafcillin injection 2 g  Status:  Discontinued     2 g Intravenous Every 4 hours 07/19/16 1912 07/19/16 1915   07/19/16 1800  ceFEPIme (MAXIPIME) 2 g in dextrose 5 % 50 mL IVPB  Status:  Discontinued     2 g  100 mL/hr over 30 Minutes Intravenous Every 24 hours 07/19/16 1646 07/19/16 1912   07/19/16 1100  vancomycin (VANCOCIN) 500 mg in sodium chloride 0.9 % 100 mL IVPB  Status:  Discontinued     500 mg 100 mL/hr over 60 Minutes Intravenous Every 12 hours 07/19/16 0953 07/19/16 1645   07/19/16 0800  piperacillin-tazobactam (ZOSYN) IVPB 3.375 g  Status:  Discontinued     3.375 g 12.5 mL/hr over 240 Minutes Intravenous Every 8 hours 07/19/16 0239 07/19/16 1645   07/19/16 0200  cefTRIAXone (ROCEPHIN) 2 g in dextrose 5 % 50 mL IVPB     2 g 100 mL/hr over 30 Minutes Intravenous  Once 07/19/16 0140 07/19/16 0259   07/19/16 0130  cefTRIAXone (ROCEPHIN) 1 g in dextrose 5 % 50 mL IVPB  Status:  Discontinued     1 g 100 mL/hr over 30 Minutes Intravenous  Once 07/19/16 0125 07/19/16 0140   07/19/16 0115  piperacillin-tazobactam (ZOSYN) IVPB 3.375 g     3.375 g 100 mL/hr over 30 Minutes Intravenous  Once 07/19/16 0109 07/19/16 0219   07/19/16 0115  vancomycin (VANCOCIN) IVPB 1000 mg/200 mL premix     1,000 mg 200 mL/hr over 60 Minutes Intravenous  Once 07/19/16 0109 07/19/16 0243        Subjective:   Zamauri Nez was seen and examined today. Somewhat interactive, however  difficult to obtain review of systems from the patient. No acute issues overnight, no fevers or chills.     Objective:   Vitals:   08/07/16 0246 08/07/16 0500 08/07/16 0732 08/07/16 1120  BP:  123/70 (!) 145/45 (!) 157/59  Pulse:  (!) 102    Resp:  12    Temp:  (!) 97.5 F (36.4 C) 98 F (36.7 C)   TempSrc:  Oral Axillary   SpO2:  97%    Weight: 81.2 kg (179 lb)  81.2 kg (179 lb)   Height:        Intake/Output Summary (Last 24 hours) at 08/07/16 1150 Last data filed at 08/07/16 1110  Gross per 24 hour  Intake          1157.43 ml  Output              300 ml  Net           857.43 ml     Wt Readings from Last 3 Encounters:  08/07/16 81.2 kg (179 lb)  09/12/14 75.3 kg (166 lb)     Exam  General: Alert and awake, NAD  Eyes: PERRLA, EOMI, Anicteric Sclera,  HEENT:  Atraumatic, normocephalic, normal oropharynx  Cardiovascular: S1 S2 auscultated, Regular rate and rhythm.  Respiratory: Clear to auscultation bilaterally, no wheezing, rales or rhonchi  Gastrointestinal: Soft, nontender, nondistended, + bowel sounds  Ext: edema in the upper and lower extremities  Neuro: moving all 4 extremities spontaneously but does not cooperate with the exam much  Musculoskeletal: No digital cyanosis, clubbing  Skin: No rashes  Psych: confused   Data Reviewed:  I have personally reviewed following labs and imaging studies  Micro Results Recent Results (from the past 240 hour(s))  Gram stain     Status: None   Collection Time: 08/01/16  3:09 PM  Result Value Ref Range Status   Specimen Description FLUID RIGHT PLEURAL  Final   Special Requests NONE  Final   Gram Stain   Final    ABUNDANT WBC PRESENT,BOTH PMN AND MONONUCLEAR NO ORGANISMS SEEN    Report Status  08/01/2016 FINAL  Final  Acid Fast Smear (AFB)     Status: None   Collection Time: 08/01/16  3:09 PM  Result Value Ref Range Status   AFB Specimen Processing Concentration  Final   Acid Fast Smear Negative  Final     Comment: (NOTE) Performed At: [facility] [place], [place] [id number] Lindon Romp MD [id number]    Source (AFB) FLUID  Final    Comment: RIGHT PLEURAL   Fungus Culture With Stain     Status: None (Preliminary result)   Collection Time: 08/01/16  3:09 PM  Result Value Ref Range Status   Fungus Stain Final report  Final    Comment: (NOTE) Performed At: [facility] [address] [place], [place] [id number] Lindon Romp MD [id number]    Fungus (Mycology) Culture PENDING  Incomplete   Fungal Source FLUID  Final    Comment: RIGHT PLEURAL   Culture, body fluid-bottle     Status: None   Collection Time: 08/01/16  3:09 PM  Result Value Ref Range Status   Specimen Description FLUID RIGHT PLEURAL  Final   Special Requests BOTTLES DRAWN AEROBIC AND ANAEROBIC 10CC  Final   Culture NO GROWTH 5 DAYS  Final   Report Status 08/06/2016 FINAL  Final  Fungus Culture Result     Status: None   Collection Time: 08/01/16  3:09 PM  Result Value Ref Range Status   Result 1 Comment  Final    Comment: (NOTE) KOH/Calcofluor preparation:  no fungus observed. Performed At: [facility] [place], [place] [id number] Lindon Romp MD PX:[id number]     Radiology Reports Dg Chest 1 View  Result Date: 08/01/2016 CLINICAL DATA:  RIGHT pleural effusion EXAM: CHEST 1 VIEW COMPARISON:  CT chest 07/30/2016 FINDINGS: Normal heart size, mediastinal contours and pulmonary vascularity. Emphysematous changes with minimal central peribronchial thickening. Small RIGHT pleural effusion question extending into minor fissure. Atelectasis versus infiltrate retrocardiac LEFT lower lobe. Remaining lungs clear. Two areas of nodularity in LEFT lung correspond to the nodular foci on recent CT. No pneumothorax. IMPRESSION: COPD changes with small RIGHT pleural effusion likely extending into minor fissure. Nodular areas LEFT lung better assessed on  recent CT chest. Emphysema (ICD10-J43.9). Electronically Signed   By: Lavonia Dana M.D.   On: 08/01/2016 15:45   Dg Ankle 2 Views Left  Result Date: 07/29/2016 CLINICAL DATA:  Blunt trauma while transferring patient to bed EXAM: LEFT ANKLE - 2 VIEW COMPARISON:  None. FINDINGS: Mild soft tissue swelling is noted laterally. No acute fracture or dislocation is seen. No other focal abnormality is noted. IMPRESSION: Mild soft tissue swelling without acute bony abnormality. Electronically Signed   By: Inez Catalina M.D.   On: 07/29/2016 19:47   Ct Head Wo Contrast  Result Date: 08/01/2016 CLINICAL DATA:  Altered mental status EXAM: CT HEAD WITHOUT CONTRAST TECHNIQUE: Contiguous axial images were obtained from the base of the skull through the vertex without intravenous contrast. COMPARISON:  Head CT 07/30/2016 FINDINGS: Brain: No mass lesion, intraparenchymal hemorrhage or extra-axial collection. No evidence of acute cortical infarct. Brain parenchyma and CSF-containing spaces are normal for age. Vascular: No hyperdense vessel or unexpected calcification. Skull: Normal visualized skull base, calvarium and extracranial soft tissues. Sinuses/Orbits: No sinus fluid levels or advanced mucosal thickening. No mastoid effusion. Normal orbits. IMPRESSION: Normal head CT. Electronically Signed   By: Ulyses Jarred M.D.   On: 08/01/2016 14:04   Ct Head Wo Contrast  Result Date: 07/30/2016 CLINICAL DATA:  Mental status change.  Pulmonary embolism. EXAM: CT HEAD WITHOUT CONTRAST TECHNIQUE: Contiguous axial images were obtained from the base of the skull through the vertex without intravenous contrast. COMPARISON:  CT head without contrast 07/22/2016 FINDINGS: Brain: Mild generalized atrophy is again noted no acute infarct, hemorrhage, or mass lesion is present. There is no significant white matter disease. The ventricles are of normal size. No significant extraaxial fluid collection is present. The Vascular: No hyperdense  vessel or unexpected calcification. Skull: The calvarium is intact. No focal lytic or blastic lesions are present. Sinuses/Orbits: The paranasal sinuses and mastoid air cells are clear. The globes and orbits are within normal limits bilaterally. IMPRESSION: 1. Stable mild generalized atrophy. 2. No acute intracranial abnormality. Electronically Signed   By: San Morelle M.D.   On: 07/30/2016 16:40   Ct Head W & Wo Contrast  Result Date: 07/22/2016 CLINICAL DATA:  Evaluate for septic emboli. IV drug abuser, with intermittent fever for the past 1 week. BILATERAL foot cellulitis. Hyponatremia. EXAM: CT HEAD WITHOUT AND WITH CONTRAST TECHNIQUE: Contiguous axial images were obtained from the base of the skull through the vertex without and with intravenous contrast CONTRAST:  77m ISOVUE-300 IOPAMIDOL (ISOVUE-300) INJECTION 61% COMPARISON:  CT head 08/23/2009. FINDINGS: Brain: No evidence of acute infarction, hemorrhage, hydrocephalus, extra-axial collection or mass lesion/mass effect. Premature for age cerebral and cerebellar atrophy. Post infusion, no abnormal enhancement of the brain or visible meninges. No vasogenic edema. Vascular: No hyperdense vessel or unexpected calcification. Visible vessels, including major dural venous sinuses, are patent. Skull: Normal. Negative for fracture or focal lesion. Sinuses/Orbits: No acute finding. Other: None. Compared with priors, there has been progressive loss of brain substance. IMPRESSION: Progressive atrophy since 2011.  No acute intracranial findings. No abnormal enhancement or vasogenic edema to suggest septic emboli to the brain. If no contraindications, MRI without and with contrast is more sensitive in the detection of intracranial infection. Electronically Signed   By: JStaci RighterM.D.   On: 07/22/2016 15:55   Ct Angio Chest Pe W Or Wo Contrast  Result Date: 07/30/2016 CLINICAL DATA:  46year old with mental status change. Bacteremia. Preliminary  results of a left upper extremity venous duplex demonstrated small thrombus in one of the radial veins. EXAM: CT ANGIOGRAPHY CHEST WITH CONTRAST TECHNIQUE: Multidetector CT imaging of the chest was performed using the standard protocol during bolus administration of intravenous contrast. Multiplanar CT image reconstructions and MIPs were obtained to evaluate the vascular anatomy. CONTRAST:  100 mL Isovue 370 COMPARISON:  Chest radiograph 07/28/2016 and chest CT 11/06/2003 FINDINGS: Cardiovascular: Negative for pulmonary embolism. Pulmonary arteries are well opacified on this examination. 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 Aorta measured 3.7 cm in 2005. Limited evaluation for dissection on this examination but no suspicious abnormalities in thoracic aorta other than the aneurysm. Heart size is within normal limits. No pericardial fluid. Mediastinum/Nodes: No significant chest lymphadenopathy but limited evaluation due to the extensive pleuroparenchymal lung disease. Lungs/Pleura: Large right pleural effusion and moderate-sized left pleural effusion. Severe emphysematous disease in the lungs. There is a combination of centrilobular and paraseptal emphysema. Near complete collapse and consolidation of the right lower lobe related to the right pleural effusion. There is a prominent 1.1 cm nodule along the right minor fissure on sequence 6, image 97. There is focal consolidation along the posterior aspect of the right minor fissure which may be involving both the right upper lobe and  the right middle lobe. This area roughly measures up to 3.4 cm on the soft tissue windows on sequence 5, image 90. This could represent a cavitary lesion with adjacent pleural thickening. Volume loss in the posterior right upper lobe related to the large pleural effusion. There may be necrotic lung tissue in the posterior right upper lung on sequence 6, image 53. Large bleb at the left lung apex. Poorly  defined hazy nodular structure in the left upper lobe with a branching configuration on sequence 6, image 57. Pleural-based nodular lesion in the lingula measuring up to 2.3 cm on sequence 6, image 18. Volume loss in the left lower lobe associated with the pleural fluid. Upper Abdomen: Upper abdominal ascites, particularly around the liver. Liver has a nodular contour and compatible with cirrhosis. Spleen is enlarged. Limited evaluation of the portal venous system on this examination. Musculoskeletal: No acute bone abnormality. Review of the MIP images confirms the above findings. IMPRESSION: Negative for a pulmonary embolism. Extensive pleural-parenchymal lung disease bilaterally. Large right pleural effusion and moderate-sized left pleural effusion. Pleural-based nodular opacities could represent an infectious etiology but also raise concern for a neoplastic process. Concern for a cavitary lesion and necrotic tissue in the right lung. Consider sampling of the pleural fluid. Recommend follow-up CT when the pleural fluid and acute process have resolved to exclude neoplastic disease. Severe emphysematous disease. Cirrhosis with evidence for portal hypertension based on the splenomegaly and ascites. 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 Recommend annual imaging followup by CTA or MRA. This recommendation follows 2010 ACCF/AHA/AATS/ACR/ASA/SCA/SCAI/SIR/STS/SVM Guidelines for the Diagnosis and Management of Patients with Thoracic Aortic Disease. Circulation. 2010; 121: Q034-V425 Electronically Signed   By: Markus Daft M.D.   On: 07/30/2016 17:05   US Renal  Result Date: 07/19/2016 CLINICAL DATA:  Acute renal failure EXAM: RENAL / URINARY TRACT ULTRASOUND COMPLETE COMPARISON:  CT 08/12/2010 FINDINGS: Right Kidney: Length: 12.2 cm. Parenchyma isoechoic to adjacent liver. No focal lesion. Mild pelvicaliectasis. Left Kidney: Length: 12.9 cm. 1.1 x 0.4 cm cortical cyst, midpole. No solid mass or  hydronephrosis. Bladder: Distended.  Right ureteral jet documented. There is a small amount of abdominal ascites identified. Bilateral pleural effusions noted. Accessory splenule. IMPRESSION: 1. Mild right pelvicaliectasis, with ureteral jet noted in the urinary bladder excluding significant ureteral obstruction. 2. Small left renal cyst. 3. Pleural effusions and abdominal ascites. Electronically Signed   By: Lucrezia Europe M.D.   On: 07/19/2016 13:17   Dg Chest Port 1 View  Result Date: 07/28/2016 CLINICAL DATA:  Pleural effusion. Pseudomonas bacteremia with acute tricuspid valve endocarditis. EXAM: PORTABLE CHEST 1 VIEW COMPARISON:  07/27/2016 and 07/25/2016 FINDINGS: Patient slightly rotated to the right. Lungs are adequately inflated demonstrate bilateral perihilar bibasilar opacification with slight interval worsening within the left base. Stable small to moderate right pleural effusion. Stable mild cardiomegaly. IMPRESSION: Persistent bilateral perihilar bibasilar opacification with slight interval worsening in the left base. Findings may be due to edema versus infection. Stable small to moderate right pleural effusion. Cardiomegaly. Electronically Signed   By: Marin Olp M.D.   On: 07/28/2016 07:33   Dg Chest Port 1 View  Result Date: 07/27/2016 CLINICAL DATA:  Acute respiratory failure EXAM: PORTABLE CHEST 1 VIEW COMPARISON:  07/25/2016 FINDINGS: Haziness of the right chest from pleural effusion. Interstitial coarsening above prior baseline. There are underlying COPD changes. Nodular density on the left was not seen 06/05/2016 and is likely acute rather than chronic nodule. Asymmetric right apical  pleural thickening, chronic. Chronic borderline cardiomegaly. No pneumothorax. IMPRESSION: 1. Layering right pleural effusion obscuring the right lower lung. 2. Probable pulmonary edema. 3. Emphysema. Electronically Signed   By: Monte Fantasia M.D.   On: 07/27/2016 07:14   Dg Chest Port 1 View  Result  Date: 07/25/2016 CLINICAL DATA:  Acute respiratory failure EXAM: PORTABLE CHEST 1 VIEW COMPARISON:  Yesterday FINDINGS: Opacity with central lucency in the peripheral right chest. There is a small to moderate right pleural effusion. No pneumothorax. Emphysema. Cardiomegaly.  Stable aortic contours. IMPRESSION: 1. Stable from yesterday. 2. Probable pneumonia with concern for cavitation on the right. 3. Small to moderate right pleural effusion. 4.  Emphysema (ICD10-J43.9). Electronically Signed   By: Monte Fantasia M.D.   On: 07/25/2016 07:46   Dg Chest Port 1 View  Result Date: 07/24/2016 CLINICAL DATA:  Shortness of breath . EXAM: PORTABLE CHEST 1 VIEW COMPARISON:  07/23/2016 .  07/19/2016.  11/06/2003. FINDINGS: Cardiomegaly. Normal pulmonary vascularity. Persistent right lower lobe consolidation consistent with pneumonia. Cavitation may be present on today's exam. Mild left base atelectasis. Density noted over the left mid lung may represent atelectasis or small amount of fluid in the fissure. Left costophrenic angle incompletely imaged. No pneumothorax . No acute bony abnormality. IMPRESSION: 1. Persistent right lower lobe consolidation consistent with pneumonia. Cavitation may be present. Contrast-enhanced chest CT may prove useful for further evaluation. Persistent right-sided pleural effusion. 2. Left base subsegmental atelectasis. Density noted over the left mid lung field on today's exam may represent atelectasis. This could also represent fissural fluid. Electronically Signed   By: Marcello Moores  Register   On: 07/24/2016 07:04   Dg Chest Port 1 View  Result Date: 07/23/2016 CLINICAL DATA:  Shortness of breath and chest pain EXAM: PORTABLE CHEST 1 VIEW COMPARISON:  July 19, 2016 FINDINGS: There is loculated effusion in the right base with patchy airspace consolidation in the right lower lobe. There is mild scarring in the upper lobes bilaterally. There is patchy atelectasis in the lower lobes. No  pneumothorax. Heart size and pulmonary vascularity are normal. No evident bone lesions. IMPRESSION: Small right pleural effusion with consolidation in the right lower lobe, likely pneumonia. A areas of scarring and atelectasis noted as well. Cardiac silhouette stable. No evident pneumothorax. Electronically Signed   By: Lowella Grip III M.D.   On: 07/23/2016 09:51   Dg Chest Port 1 View  Result Date: 07/19/2016 CLINICAL DATA:  Cellulitis. Worsening swelling and redness of both feet. Intermittent fever. Code sepsis. EXAM: PORTABLE CHEST 1 VIEW COMPARISON:  Radiograph 06/05/2016 FINDINGS: Emphysema. Lung volumes from prior exam. Normal heart size and mediastinal contours for technique. There are streaky opacities in both lower lobes, with slightly more patchy right lung base opacity. Small right pleural effusion. No pneumothorax. No acute osseous abnormality. IMPRESSION: Emphysema. Streaky lower lobe opacities favoring atelectasis. Right lung base slightly more patchy which may reflect aspiration or pneumonia. Possible small right pleural effusion. Electronically Signed   By: Jeb Levering M.D.   On: 07/19/2016 01:53   Ir Thoracentesis Asp Pleural Space W/img Guide  Result Date: 08/01/2016 INDICATION: Shortness of breath. Large right pleural effusion. Request diagnostic and therapeutic thoracentesis. EXAM: ULTRASOUND GUIDED RIGHT THORACENTESIS MEDICATIONS: None. COMPLICATIONS: None immediate. Postprocedural chest x-ray negative for pneumothorax. PROCEDURE: An ultrasound guided thoracentesis was thoroughly discussed with the patient and questions answered. The benefits, risks, alternatives and complications were also discussed. The patient understands and wishes to proceed with the procedure. Written consent was obtained. Ultrasound  was performed to localize and mark an adequate pocket of fluid in the right chest. The area was then prepped and draped in the normal sterile fashion. 1% Lidocaine was used  for local anesthesia. Under ultrasound guidance a Safe-T-Centesis catheter was introduced. Thoracentesis was performed. The catheter was removed and a dressing applied. FINDINGS: A total of approximately 1.5 L of hazy, yellow fluid was removed. Samples were sent to the laboratory as requested by the clinical team. IMPRESSION: Successful ultrasound guided right thoracentesis yielding 1.5 L of pleural fluid. Read by: Ascencion Dike PA-C Electronically Signed   By: Sandi Mariscal M.D.   On: 08/01/2016 15:34    Lab Data:  CBC:  Recent Labs Lab 08/03/16 0403 08/04/16 0352 08/05/16 0450 08/06/16 0543 08/07/16 0521  WBC 8.0 6.3 7.0 7.6 6.4  HGB 9.4* 8.6* 8.5* 8.6* 8.8*  HCT 28.3* 26.6* 26.4* 26.6* 27.2*  MCV 85.8 85.5 86.8 88.7 87.5  PLT 258 229 203 176 834   Basic Metabolic Panel:  Recent Labs Lab 08/03/16 0403 08/04/16 0352 08/05/16 0450 08/06/16 0543 08/07/16 0521  NA 140 139 142 140 140  K 4.1 3.7 3.7 3.8 3.8  CL 120* 120* 122* 120* 119*  CO2 15* 16* 17* 17* 15*  GLUCOSE 124* 254* 109* 114* 90  BUN 48* 42* 42* 42* 42*  CREATININE 1.30* 1.08 1.23 1.36* 1.46*  CALCIUM 9.0 8.6* 8.9 9.0 8.9   GFR: Estimated Creatinine Clearance: 68.1 mL/min (A) (by C-G formula based on SCr of 1.46 mg/dL (H)). Liver Function Tests: No results for input(s): AST, ALT, ALKPHOS, BILITOT, PROT, ALBUMIN in the last 168 hours. No results for input(s): LIPASE, AMYLASE in the last 168 hours.  Recent Labs Lab 08/02/16 1131  AMMONIA 28   Coagulation Profile: No results for input(s): INR, PROTIME in the last 168 hours. Cardiac Enzymes: No results for input(s): CKTOTAL, CKMB, CKMBINDEX, TROPONINI in the last 168 hours. BNP (last 3 results) No results for input(s): PROBNP in the last 8760 hours. HbA1C: No results for input(s): HGBA1C in the last 72 hours. CBG:  Recent Labs Lab 08/05/16 1600 08/06/16 0740 08/06/16 1211 08/06/16 1623 08/06/16 1926  GLUCAP 99 103* 105* 85 79   Lipid  Profile: No results for input(s): CHOL, HDL, LDLCALC, TRIG, CHOLHDL, LDLDIRECT in the last 72 hours. Thyroid Function Tests: No results for input(s): TSH, T4TOTAL, FREET4, T3FREE, THYROIDAB in the last 72 hours. Anemia Panel: No results for input(s): VITAMINB12, FOLATE, FERRITIN, TIBC, IRON, RETICCTPCT in the last 72 hours. Urine analysis:    Component Value Date/Time   COLORURINE AMBER (A) 07/28/2016 1622   APPEARANCEUR TURBID (A) 07/28/2016 1622   LABSPEC 1.034 (H) 07/28/2016 1622   PHURINE 5.0 07/28/2016 1622   GLUCOSEU 50 (A) 07/28/2016 1622   HGBUR LARGE (A) 07/28/2016 1622   BILIRUBINUR SMALL (A) 07/28/2016 1622   KETONESUR 5 (A) 07/28/2016 1622   PROTEINUR >=300 (A) 07/28/2016 1622   NITRITE NEGATIVE 07/28/2016 1622   LEUKOCYTESUR NEGATIVE 07/28/2016 1622     Juluis Fitzsimmons M.D. [facility] 08/07/2016, 11:50 AM  Pager: [phone] Between 7am to 7pm - call Pager - [phone]  After 7pm go to [email or web address] - password [email or web address]  Call night coverage person covering after 7pm

## 2016-08-07 NOTE — Progress Notes (Signed)
ANTICOAGULATION CONSULT NOTE - Follow Up Consult  Pharmacy Consult for Heparin Indication: DVT  No Known Allergies  Patient Measurements: Height: 5\' 11"  (180.3 cm) Weight: 179 lb (81.2 kg) IBW/kg (Calculated) : 75.3  Vital Signs: Temp: 98 F (36.7 C) (08/01 0732) Temp Source: Axillary (08/01 0732) BP: 157/59 (08/01 1120) Pulse Rate: 102 (08/01 0500)  Labs:  Recent Labs  08/05/16 0450 08/06/16 0543 08/07/16 0521 08/07/16 1459  HGB 8.5* 8.6* 8.8*  --   HCT 26.4* 26.6* 27.2*  --   PLT 203 176 150  --   HEPARINUNFRC 0.60 0.64 0.87* 0.53  CREATININE 1.23 1.36* 1.46*  --     Estimated Creatinine Clearance: 68.1 mL/min (A) (by C-G formula based on SCr of 1.46 mg/dL (H)).   Medications:  Heparin @ 1600 units/hr  Assessment: 45yom continues on heparin for acute DVT in the radial veins of his left forearm. Heparin level is now therapeutic 0.53 after rate decrease earlier today.  Goal of Therapy:  Heparin level 0.3-0.7 units/ml Monitor platelets by anticoagulation protocol: Yes   Plan:  1) Continue heparin at 1600 units/hr 2) Follow up daily heparin level and CBC  Fredrik RiggerMarkle, Kenneth Lax Sue 08/07/2016,3:50 PM

## 2016-08-07 NOTE — Progress Notes (Addendum)
Subjective:   Pt is confused  Antibiotics:  Anti-infectives    Start     Dose/Rate Route Frequency Ordered Stop   07/22/16 1400  ceFEPIme (MAXIPIME) 2 g in dextrose 5 % 50 mL IVPB     2 g 100 mL/hr over 30 Minutes Intravenous Every 8 hours 07/22/16 1122 09/01/16 0259   07/20/16 1500  ceFEPIme (MAXIPIME) 2 g in dextrose 5 % 50 mL IVPB  Status:  Discontinued     2 g 100 mL/hr over 30 Minutes Intravenous Every 12 hours 07/20/16 1410 07/22/16 1122   07/19/16 2300  vancomycin (VANCOCIN) IVPB 750 mg/150 ml premix  Status:  Discontinued     750 mg 150 mL/hr over 60 Minutes Intravenous Every 24 hours 07/19/16 0237 07/19/16 0953   07/19/16 2200  ceFEPIme (MAXIPIME) 2 g in dextrose 5 % 50 mL IVPB  Status:  Discontinued     2 g 100 mL/hr over 30 Minutes Intravenous Every 24 hours 07/19/16 1931 07/20/16 1410   07/19/16 2000  nafcillin 2 g in dextrose 5 % 100 mL IVPB  Status:  Discontinued     2 g 200 mL/hr over 30 Minutes Intravenous Every 4 hours 07/19/16 1915 07/22/16 1130   07/19/16 1915  nafcillin injection 2 g  Status:  Discontinued     2 g Intravenous Every 4 hours 07/19/16 1912 07/19/16 1915   07/19/16 1800  ceFEPIme (MAXIPIME) 2 g in dextrose 5 % 50 mL IVPB  Status:  Discontinued     2 g 100 mL/hr over 30 Minutes Intravenous Every 24 hours 07/19/16 1646 07/19/16 1912   07/19/16 1100  vancomycin (VANCOCIN) 500 mg in sodium chloride 0.9 % 100 mL IVPB  Status:  Discontinued     500 mg 100 mL/hr over 60 Minutes Intravenous Every 12 hours 07/19/16 0953 07/19/16 1645   07/19/16 0800  piperacillin-tazobactam (ZOSYN) IVPB 3.375 g  Status:  Discontinued     3.375 g 12.5 mL/hr over 240 Minutes Intravenous Every 8 hours 07/19/16 0239 07/19/16 1645   07/19/16 0200  cefTRIAXone (ROCEPHIN) 2 g in dextrose 5 % 50 mL IVPB     2 g 100 mL/hr over 30 Minutes Intravenous  Once 07/19/16 0140 07/19/16 0259   07/19/16 0130  cefTRIAXone (ROCEPHIN) 1 g in dextrose 5 % 50 mL IVPB  Status:   Discontinued     1 g 100 mL/hr over 30 Minutes Intravenous  Once 07/19/16 0125 07/19/16 0140   07/19/16 0115  piperacillin-tazobactam (ZOSYN) IVPB 3.375 g     3.375 g 100 mL/hr over 30 Minutes Intravenous  Once 07/19/16 0109 07/19/16 0219   07/19/16 0115  vancomycin (VANCOCIN) IVPB 1000 mg/200 mL premix     1,000 mg 200 mL/hr over 60 Minutes Intravenous  Once 07/19/16 0109 07/19/16 0243      Medications: Scheduled Meds: . buprenorphine-naloxone  1 tablet Sublingual Daily  . Chlorhexidine Gluconate Cloth  6 each Topical Daily  . cloNIDine  0.3 mg Transdermal Weekly  . feeding supplement  1 Container Oral TID BM  . folic acid  1 mg Oral Daily  . metoprolol tartrate  5 mg Intravenous Q6H  . nitroGLYCERIN  1 inch Topical Q6H  . sodium chloride flush  10-40 mL Intracatheter Q12H  . sodium chloride flush  3 mL Intravenous Q12H   Continuous Infusions: . sodium chloride 50 mL (08/07/16 1507)  . ceFEPime (MAXIPIME) IV Stopped (08/07/16 1140)  . heparin 1,600 Units/hr (08/07/16 1412)  PRN Meds:.acetaminophen **OR** acetaminophen, albuterol, fentaNYL (SUBLIMAZE) injection, haloperidol lactate, hydrALAZINE, LORazepam, ondansetron (ZOFRAN) IV, sodium chloride flush    Objective: Weight change: 2 lb (0.907 kg)  Intake/Output Summary (Last 24 hours) at 08/07/16 1835 Last data filed at 08/07/16 1809  Gross per 24 hour  Intake          1528.97 ml  Output              815 ml  Net           713.97 ml   Blood pressure (!) 147/105, pulse 94, temperature 98 F (36.7 C), temperature source Axillary, resp. rate 19, height 5\' 11"  (1.803 m), weight 179 lb (81.2 kg), SpO2 100 %. Temp:  [97.5 F (36.4 C)-98 F (36.7 C)] 98 F (36.7 C) (08/01 0732) Pulse Rate:  [60-102] 94 (08/01 1802) Resp:  [11-19] 19 (08/01 1802) BP: (123-188)/(45-111) 147/105 (08/01 1802) SpO2:  [97 %-100 %] 100 % (08/01 1802) Weight:  [179 lb (81.2 kg)] 179 lb (81.2 kg) (08/01 0732)  Physical Exam: General:  somnolent, groaning to noxious stimuli  HEENT: anicteric sclera,  EOMI CVS tachy rate, normal r,  no murmur rubs or gallops Chest: fairly clear to auscultation bilaterally anteriorly no wheezing, Abdomen: soft nontender, nondistended, normal bowel sounds, Extremities: he has desqumation of skin on soles and other areas Neuro: nonfocal  CBC:  CBC Latest Ref Rng & Units 08/07/2016 08/06/2016 08/05/2016  WBC 4.0 - 10.5 K/uL 6.4 7.6 7.0  Hemoglobin 13.0 - 17.0 g/dL 1.4(N) 8.2(N) 5.6(O)  Hematocrit 39.0 - 52.0 % 27.2(L) 26.6(L) 26.4(L)  Platelets 150 - 400 K/uL 150 176 203      BMET  Recent Labs  08/06/16 0543 08/07/16 0521  NA 140 140  K 3.8 3.8  CL 120* 119*  CO2 17* 15*  GLUCOSE 114* 90  BUN 42* 42*  CREATININE 1.36* 1.46*  CALCIUM 9.0 8.9     Liver Panel  No results for input(s): PROT, ALBUMIN, AST, ALT, ALKPHOS, BILITOT, BILIDIR, IBILI in the last 72 hours.     Sedimentation Rate No results for input(s): ESRSEDRATE in the last 72 hours. C-Reactive Protein No results for input(s): CRP in the last 72 hours.  Micro Results: Recent Results (from the past 720 hour(s))  Culture, blood (Routine x 2)     Status: Abnormal   Collection Time: 07/18/16 10:55 PM  Result Value Ref Range Status   Specimen Description BLOOD RIGHT ARM  Final   Special Requests IN PEDIATRIC BOTTLE Blood Culture adequate volume  Final   Culture  Setup Time   Final    GRAM POSITIVE COCCI IN CLUSTERS IN PEDIATRIC BOTTLE CRITICAL RESULT CALLED TO, READ BACK BY AND VERIFIED WITH: K HAMMONS,PHARMD AT 1619 07/19/16 BY L BENFIELD    Culture (A)  Final    STAPHYLOCOCCUS AUREUS SUSCEPTIBILITIES PERFORMED ON PREVIOUS CULTURE WITHIN THE LAST 5 DAYS.    Report Status 07/21/2016 FINAL  Final  Blood Culture ID Panel (Reflexed)     Status: Abnormal   Collection Time: 07/18/16 10:55 PM  Result Value Ref Range Status   Enterococcus species NOT DETECTED NOT DETECTED Final   Listeria monocytogenes NOT  DETECTED NOT DETECTED Final   Staphylococcus species DETECTED (A) NOT DETECTED Final    Comment: CRITICAL RESULT CALLED TO, READ BACK BY AND VERIFIED WITH: K HAMMONS, PHARMD AT 1619 07/19/16 BY L BENFIELD    Staphylococcus aureus DETECTED (A) NOT DETECTED Final    Comment: Methicillin (oxacillin) susceptible Staphylococcus  aureus (MSSA). Preferred therapy is anti staphylococcal beta lactam antibiotic (Cefazolin or Nafcillin), unless clinically contraindicated. CRITICAL RESULT CALLED TO, READ BACK BY AND VERIFIED WITH: K HAMMONS,PHARMD AT 1619 07/19/16 BY L BENFIELD    Methicillin resistance NOT DETECTED NOT DETECTED Final   Streptococcus species NOT DETECTED NOT DETECTED Final   Streptococcus agalactiae NOT DETECTED NOT DETECTED Final   Streptococcus pneumoniae NOT DETECTED NOT DETECTED Final   Streptococcus pyogenes NOT DETECTED NOT DETECTED Final   Acinetobacter baumannii NOT DETECTED NOT DETECTED Final   Enterobacteriaceae species NOT DETECTED NOT DETECTED Final   Enterobacter cloacae complex NOT DETECTED NOT DETECTED Final   Escherichia coli NOT DETECTED NOT DETECTED Final   Klebsiella oxytoca NOT DETECTED NOT DETECTED Final   Klebsiella pneumoniae NOT DETECTED NOT DETECTED Final   Proteus species NOT DETECTED NOT DETECTED Final   Serratia marcescens NOT DETECTED NOT DETECTED Final   Carbapenem resistance NOT DETECTED NOT DETECTED Final   Haemophilus influenzae NOT DETECTED NOT DETECTED Final   Neisseria meningitidis NOT DETECTED NOT DETECTED Final   Pseudomonas aeruginosa DETECTED (A) NOT DETECTED Final    Comment: CRITICAL RESULT CALLED TO, READ BACK BY AND VERIFIED WITH: K HAMMONS,PHARMD AT 1619 07/19/16 BY L BENFIELD    Candida albicans NOT DETECTED NOT DETECTED Final   Candida glabrata NOT DETECTED NOT DETECTED Final   Candida krusei NOT DETECTED NOT DETECTED Final   Candida parapsilosis NOT DETECTED NOT DETECTED Final   Candida tropicalis NOT DETECTED NOT DETECTED Final    Culture, blood (Routine x 2)     Status: Abnormal   Collection Time: 07/18/16 11:45 PM  Result Value Ref Range Status   Specimen Description BLOOD LEFT ARM  Final   Special Requests   Final    BOTTLES DRAWN AEROBIC AND ANAEROBIC Blood Culture adequate volume   Culture  Setup Time   Final    GRAM POSITIVE COCCI IN CLUSTERS IN BOTH AEROBIC AND ANAEROBIC BOTTLES CRITICAL VALUE NOTED.  VALUE IS CONSISTENT WITH PREVIOUSLY REPORTED AND CALLED VALUE.    Culture STAPHYLOCOCCUS AUREUS PSEUDOMONAS AERUGINOSA  (A)  Final   Report Status 07/21/2016 FINAL  Final   Organism ID, Bacteria STAPHYLOCOCCUS AUREUS  Final   Organism ID, Bacteria PSEUDOMONAS AERUGINOSA  Final      Susceptibility   Pseudomonas aeruginosa - MIC*    CEFTAZIDIME 2 SENSITIVE Sensitive     CIPROFLOXACIN <=0.25 SENSITIVE Sensitive     GENTAMICIN <=1 SENSITIVE Sensitive     IMIPENEM 1 SENSITIVE Sensitive     PIP/TAZO 8 SENSITIVE Sensitive     CEFEPIME 2 SENSITIVE Sensitive     * PSEUDOMONAS AERUGINOSA   Staphylococcus aureus - MIC*    CIPROFLOXACIN <=0.5 SENSITIVE Sensitive     ERYTHROMYCIN 1 INTERMEDIATE Intermediate     GENTAMICIN <=0.5 SENSITIVE Sensitive     OXACILLIN <=0.25 SENSITIVE Sensitive     TETRACYCLINE <=1 SENSITIVE Sensitive     VANCOMYCIN 1 SENSITIVE Sensitive     TRIMETH/SULFA <=10 SENSITIVE Sensitive     CLINDAMYCIN RESISTANT Resistant     RIFAMPIN <=0.5 SENSITIVE Sensitive     Inducible Clindamycin POSITIVE Resistant     * STAPHYLOCOCCUS AUREUS  CSF culture     Status: None   Collection Time: 07/19/16  4:07 AM  Result Value Ref Range Status   Specimen Description CSF  Final   Special Requests NONE  Final   Gram Stain   Final    CYTOSPIN SMEAR WBC PRESENT, PREDOMINANTLY PMN NO ORGANISMS SEEN  Culture NO GROWTH 3 DAYS  Final   Report Status 07/22/2016 FINAL  Final  MRSA PCR Screening     Status: None   Collection Time: 07/19/16  5:26 AM  Result Value Ref Range Status   MRSA by PCR NEGATIVE  NEGATIVE Final    Comment:        The GeneXpert MRSA Assay (FDA approved for NASAL specimens only), is one component of a comprehensive MRSA colonization surveillance program. It is not intended to diagnose MRSA infection nor to guide or monitor treatment for MRSA infections.   Respiratory Panel by PCR     Status: None   Collection Time: 07/19/16 12:19 PM  Result Value Ref Range Status   Adenovirus NOT DETECTED NOT DETECTED Final   Coronavirus 229E NOT DETECTED NOT DETECTED Final   Coronavirus HKU1 NOT DETECTED NOT DETECTED Final   Coronavirus NL63 NOT DETECTED NOT DETECTED Final   Coronavirus OC43 NOT DETECTED NOT DETECTED Final   Metapneumovirus NOT DETECTED NOT DETECTED Final   Rhinovirus / Enterovirus NOT DETECTED NOT DETECTED Final   Influenza A NOT DETECTED NOT DETECTED Final   Influenza B NOT DETECTED NOT DETECTED Final   Parainfluenza Virus 1 NOT DETECTED NOT DETECTED Final   Parainfluenza Virus 2 NOT DETECTED NOT DETECTED Final   Parainfluenza Virus 3 NOT DETECTED NOT DETECTED Final   Parainfluenza Virus 4 NOT DETECTED NOT DETECTED Final   Respiratory Syncytial Virus NOT DETECTED NOT DETECTED Final   Bordetella pertussis NOT DETECTED NOT DETECTED Final   Chlamydophila pneumoniae NOT DETECTED NOT DETECTED Final   Mycoplasma pneumoniae NOT DETECTED NOT DETECTED Final  Culture, Urine     Status: Abnormal   Collection Time: 07/19/16 10:38 PM  Result Value Ref Range Status   Specimen Description URINE, CLEAN CATCH  Final   Special Requests NONE  Final   Culture MULTIPLE SPECIES PRESENT, SUGGEST RECOLLECTION (A)  Final   Report Status 07/21/2016 FINAL  Final  Culture, blood (routine x 2)     Status: None   Collection Time: 07/20/16  7:02 AM  Result Value Ref Range Status   Specimen Description BLOOD LEFT HAND  Final   Special Requests   Final    BOTTLES DRAWN AEROBIC ONLY Blood Culture adequate volume   Culture NO GROWTH 5 DAYS  Final   Report Status 07/25/2016 FINAL   Final  Culture, blood (routine x 2)     Status: Abnormal   Collection Time: 07/20/16  7:03 AM  Result Value Ref Range Status   Specimen Description BLOOD RIGHT HAND  Final   Special Requests IN PEDIATRIC BOTTLE Blood Culture adequate volume  Final   Culture  Setup Time   Final    GRAM POSITIVE COCCI IN CLUSTERS IN PEDIATRIC BOTTLE CRITICAL RESULT CALLED TO, READ BACK BY AND VERIFIED WITH: TO JBATCHELDER(PHARMd) BY TCLEVELAND 07/21/2016 AT 4:57AM    Culture (A)  Final    STAPHYLOCOCCUS AUREUS SUSCEPTIBILITIES PERFORMED ON PREVIOUS CULTURE WITHIN THE LAST 5 DAYS.    Report Status 07/23/2016 FINAL  Final  Blood Culture ID Panel (Reflexed)     Status: Abnormal   Collection Time: 07/20/16  7:03 AM  Result Value Ref Range Status   Enterococcus species NOT DETECTED NOT DETECTED Final   Listeria monocytogenes NOT DETECTED NOT DETECTED Final   Staphylococcus species DETECTED (A) NOT DETECTED Final    Comment: CRITICAL RESULT CALLED TO, READ BACK BY AND VERIFIED WITH: TO JBATCHELDER(PHARMd) BY TCLEVELAND 07/21/2016 AT 4:53AM    Staphylococcus  aureus DETECTED (A) NOT DETECTED Final    Comment: Methicillin (oxacillin) susceptible Staphylococcus aureus (MSSA). Preferred therapy is anti staphylococcal beta lactam antibiotic (Cefazolin or Nafcillin), unless clinically contraindicated. CRITICAL RESULT CALLED TO, READ BACK BY AND VERIFIED WITH: TO JBATCHELDER(PHARMd) BY [facility] LL[facility] 07/21/2016 AT 4:53AM    Methicillin resistance NOT DETECTED NOT DETECTED Final   Streptococcus species NOT DETECTED NOT DETECTED Final   Streptococcus agalactiae NOT DETECTED NOT DETECTED Final   Streptococcus pneumoniae NOT DETECTED NOT DETECTED Final   Streptococcus pyogenes NOT DETECTED NOT DETECTED Final   Acinetobacter baumannii NOT DETECTED NOT DETECTED Final   Enterobacteriaceae species NOT DETECTED NOT DETECTED Final   Enterobacter cloacae complex NOT DETECTED NOT DETECTED Final   Escherichia coli NOT DETECTED NOT  DETECTED Final   Klebsiella oxytoca NOT DETECTED NOT DETECTED Final   Klebsiella pneumoniae NOT DETECTED NOT DETECTED Final   Proteus species NOT DETECTED NOT DETECTED Final   Serratia marcescens NOT DETECTED NOT DETECTED Final   Haemophilus influenzae NOT DETECTED NOT DETECTED Final   Neisseria meningitidis NOT DETECTED NOT DETECTED Final   Pseudomonas aeruginosa NOT DETECTED NOT DETECTED Final   Candida albicans NOT DETECTED NOT DETECTED Final   Candida glabrata NOT DETECTED NOT DETECTED Final   Candida krusei NOT DETECTED NOT DETECTED Final   Candida parapsilosis NOT DETECTED NOT DETECTED Final   Candida tropicalis NOT DETECTED NOT DETECTED Final  Culture, blood (single)     Status: None   Collection Time: 07/21/16  1:44 PM  Result Value Ref Range Status   Specimen Description BLOOD BLOOD RIGHT HAND  Final   Special Requests   Final    BOTTLES DRAWN AEROBIC AND ANAEROBIC Blood Culture adequate volume   Culture NO GROWTH 5 DAYS  Final   Report Status 07/26/2016 FINAL  Final  MRSA PCR Screening     Status: None   Collection Time: 07/23/16 10:30 AM  Result Value Ref Range Status   MRSA by PCR NEGATIVE NEGATIVE Final    Comment:        The GeneXpert MRSA Assay (FDA approved for NASAL specimens only), is one component of a comprehensive MRSA colonization surveillance program. It is not intended to diagnose MRSA infection nor to guide or monitor treatment for MRSA infections.   Urine culture     Status: None   Collection Time: 07/23/16  4:41 PM  Result Value Ref Range Status   Specimen Description URINE, CATHETERIZED  Final   Special Requests NONE  Final   Culture NO GROWTH  Final   Report Status 07/24/2016 FINAL  Final  Gram stain     Status: None   Collection Time: 08/01/16  3:09 PM  Result Value Ref Range Status   Specimen Description FLUID RIGHT PLEURAL  Final   Special Requests NONE  Final   Gram Stain   Final    ABUNDANT WBC PRESENT,BOTH PMN AND MONONUCLEAR NO  ORGANISMS SEEN    Report Status 08/01/2016 FINAL  Final  Acid Fast Smear (AFB)     Status: None   Collection Time: 08/01/16  3:09 PM  Result Value Ref Range Status   AFB Specimen Processing Concentration  Final   Acid Fast Smear Negative  Final    Comment: (NOTE) Performed At: [facility] [address] [place], [place] [id number] Mila HomerHancock William F MD [id number]    Source (AFB) FLUID  Final    Comment: RIGHT PLEURAL   Fungus Culture With Stain     Status: None (Preliminary result)  Collection Time: 08/01/16  3:09 PM  Result Value Ref Range Status   Fungus Stain Final report  Final    Comment: (NOTE) Performed At: [facility] [address] [place], [place] [id number] Mila Homer MD VF:[id number]    Fungus (Mycology) Culture PENDING  Incomplete   Fungal Source FLUID  Final    Comment: RIGHT PLEURAL   Culture, body fluid-bottle     Status: None   Collection Time: 08/01/16  3:09 PM  Result Value Ref Range Status   Specimen Description FLUID RIGHT PLEURAL  Final   Special Requests BOTTLES DRAWN AEROBIC AND ANAEROBIC 10CC  Final   Culture NO GROWTH 5 DAYS  Final   Report Status 08/06/2016 FINAL  Final  Fungus Culture Result     Status: None   Collection Time: 08/01/16  3:09 PM  Result Value Ref Range Status   Result 1 Comment  Final    Comment: (NOTE) KOH/Calcofluor preparation:  no fungus observed. Performed At: [facility] [address] [place], [place] [id number] Mila Homer MD [id number]     Studies/Results: No results found.    Assessment/Plan:  INTERVAL HISTORY:  Pt remains confused   Principal Problem:   MSSA bacteremia Active Problems:   Sepsis (HCC)   UTI (urinary tract infection)   ARF (acute renal failure) (HCC)   Hyponatremia   Abnormal liver function   AKI (acute kidney injury) (HCC)   IVDU (intravenous drug user)   Meningitis   Pneumonia   Bacteremia due to Pseudomonas   Endocarditis of  tricuspid valve   Bacteremia   Acute encephalopathy   Alcohol withdrawal syndrome with complication (HCC)   Opiate withdrawal (HCC)   Pleural effusion    Dyan Creelman is a 46 y.o. 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

## 2016-08-07 NOTE — Progress Notes (Addendum)
  Speech Language Pathology Treatment: Dysphagia  Patient Details Name: Johnathan [name] MRN: [id number] DOB: [birth year] Today's Date: 08/07/2016 Time: [id number] SLP Time Calculation (min) (ACUTE ONLY): 18 min  Assessment / Plan / Recommendation Clinical Impression  Pt lethargic, groggy, mostly grunting noises although he stated he "needed the bathroom". Much encouragement and total feeding assist despite tactile cues. Consumed 3 straw sips OJ and 2 bites grits with decreased awareness,  manipulation with solids and delayed transit. Eructation present. Downgraded diet from soft to Dys 1 (puree), continue thin via straw, crush meds. Continue ST.   HPI HPI: Johnathan [name] a 46 y.o.male admitted with sepsis secondary to UTI vs cellulitis and ARF and hyponatremia(note LP pending due to ? Neck stiffness). PMH:w Hep C?, IVDA, (recently injected opana), c/o fever intermittently and redness over the dorsum of the right foot and slight swelling. Per chart pt notes injecting Opana about 2 days prior to admit. Pt seen for BSE 7/13 without indications of oropharyngeal dysphagia; shallow respiratory pattern; regular diet/thin recommended and signed off. Transferred to ICU for Precedex due to ETOH withdrawal 7/17. Developed right cavitary PNA from septic emboli with right pleural effusion. Regular diet order however pt has been lethargic.      SLP Plan  Continue with current plan of care       Recommendations  Diet recommendations: Dysphagia 1 (puree);Thin liquid Liquids provided via: Cup;Straw Medication Administration: Crushed with puree Supervision: Staff to assist with self feeding;Full supervision/cueing for compensatory strategies Compensations: Slow rate;Small sips/bites;Lingual sweep for clearance of pocketing Postural Changes and/or Swallow Maneuvers: Seated upright 90 degrees                Oral Care Recommendations: Oral care BID Follow up Recommendations: Skilled  Nursing facility SLP Visit Diagnosis: Dysphagia, unspecified (R13.10) Plan: Continue with current plan of care       GO                [name], Hebe Merriwether Willis 08/07/2016, 9:24 AM   Breck CoonsLisa [name] M.Ed [employer]-SLP Pager [phone]

## 2016-08-08 ENCOUNTER — Inpatient Hospital Stay (HOSPITAL_COMMUNITY): Payer: Medicaid Other

## 2016-08-08 ENCOUNTER — Encounter (HOSPITAL_COMMUNITY): Payer: Self-pay

## 2016-08-08 DIAGNOSIS — G931 Anoxic brain damage, not elsewhere classified: Secondary | ICD-10-CM

## 2016-08-08 DIAGNOSIS — J9600 Acute respiratory failure, unspecified whether with hypoxia or hypercapnia: Secondary | ICD-10-CM

## 2016-08-08 LAB — GLUCOSE, CAPILLARY
GLUCOSE-CAPILLARY: 67 mg/dL (ref 65–99)
GLUCOSE-CAPILLARY: 56 mg/dL — AB (ref 65–99)
GLUCOSE-CAPILLARY: 121 mg/dL — AB (ref 65–99)
Glucose-Capillary: 67 mg/dL (ref 65–99)
Glucose-Capillary: 66 mg/dL (ref 65–99)
Glucose-Capillary: 112 mg/dL — ABNORMAL HIGH (ref 65–99)
Glucose-Capillary: 57 mg/dL — ABNORMAL LOW (ref 65–99)

## 2016-08-08 LAB — BASIC METABOLIC PANEL
ANION GAP: 3 — AB (ref 5–15)
BUN: 49 mg/dL — AB (ref 6–20)
CO2: 14 mmol/L — AB (ref 22–32)
CREATININE: 1.79 mg/dL — AB (ref 0.61–1.24)
Calcium: 8.8 mg/dL — ABNORMAL LOW (ref 8.9–10.3)
Chloride: 125 mmol/L — ABNORMAL HIGH (ref 101–111)
GFR calc Af Amer: 51 mL/min — ABNORMAL LOW (ref >60)
GFR calc non Af Amer: 44 mL/min — ABNORMAL LOW (ref >60)
GLUCOSE: 124 mg/dL — AB (ref 65–99)
POTASSIUM: 3.4 mmol/L — AB (ref 3.5–5.1)
Sodium: 142 mmol/L (ref 135–145)

## 2016-08-08 LAB — CBC
HCT: 25.2 % — ABNORMAL LOW (ref 39.0–52.0)
HEMOGLOBIN: 8.2 g/dL — AB (ref 13.0–17.0)
MCH: 28.6 pg (ref 26.0–34.0)
MCHC: 32.5 g/dL (ref 30.0–36.0)
MCV: 87.8 fL (ref 78.0–100.0)
PLATELETS: 134 10*3/uL — AB (ref 150–400)
RBC: 2.87 MIL/uL — AB (ref 4.22–5.81)
RDW: 21.5 % — ABNORMAL HIGH (ref 11.5–15.5)
WBC: 5.6 10*3/uL (ref 4.0–10.5)

## 2016-08-08 LAB — HEPARIN LEVEL (UNFRACTIONATED): HEPARIN UNFRACTIONATED: 0.66 [IU]/mL (ref 0.30–0.70)

## 2016-08-08 MED ORDER — DEXTROSE 50 % IV SOLN
50.0000 mL | Freq: Once | INTRAVENOUS | Status: AC
  Administered 2016-08-08: 50 mL via INTRAVENOUS

## 2016-08-08 MED ORDER — DEXTROSE 50 % IV SOLN
1.0000 | INTRAVENOUS | Status: DC | PRN
  Administered 2016-08-08 – 2016-08-16 (×3): 50 mL via INTRAVENOUS
  Filled 2016-08-08 (×3): qty 50

## 2016-08-08 MED ORDER — DEXTROSE 50 % IV SOLN
INTRAVENOUS | Status: AC
  Administered 2016-08-08: 09:00:00
  Filled 2016-08-08: qty 50

## 2016-08-08 MED ORDER — LORAZEPAM 2 MG/ML IJ SOLN: 1.0000 mg | Freq: Once | INTRAMUSCULAR | Status: DC

## 2016-08-08 MED ORDER — DEXTROSE 50 % IV SOLN
INTRAVENOUS | Status: AC
  Filled 2016-08-08: qty 50

## 2016-08-08 MED ORDER — DEXTROSE 50 % IV SOLN
1.0000 | Freq: Once | INTRAVENOUS | Status: AC
  Administered 2016-08-08: 50 mL via INTRAVENOUS

## 2016-08-08 MED ORDER — DEXTROSE 50 % IV SOLN
INTRAVENOUS | Status: AC
  Administered 2016-08-08: 12:00:00
  Filled 2016-08-08: qty 50

## 2016-08-08 NOTE — Progress Notes (Signed)
Subjective:   Pt is confused not able to communicate  Antibiotics:  Anti-infectives    Start     Dose/Rate Route Frequency Ordered Stop   07/22/16 1400  ceFEPIme (MAXIPIME) 2 g in dextrose 5 % 50 mL IVPB     2 g 100 mL/hr over 30 Minutes Intravenous Every 8 hours 07/22/16 1122 09/01/16 0259   07/20/16 1500  ceFEPIme (MAXIPIME) 2 g in dextrose 5 % 50 mL IVPB  Status:  Discontinued     2 g 100 mL/hr over 30 Minutes Intravenous Every 12 hours 07/20/16 1410 07/22/16 1122   07/19/16 2300  vancomycin (VANCOCIN) IVPB 750 mg/150 ml premix  Status:  Discontinued     750 mg 150 mL/hr over 60 Minutes Intravenous Every 24 hours 07/19/16 0237 07/19/16 0953   07/19/16 2200  ceFEPIme (MAXIPIME) 2 g in dextrose 5 % 50 mL IVPB  Status:  Discontinued     2 g 100 mL/hr over 30 Minutes Intravenous Every 24 hours 07/19/16 1931 07/20/16 1410   07/19/16 2000  nafcillin 2 g in dextrose 5 % 100 mL IVPB  Status:  Discontinued     2 g 200 mL/hr over 30 Minutes Intravenous Every 4 hours 07/19/16 1915 07/22/16 1130   07/19/16 1915  nafcillin injection 2 g  Status:  Discontinued     2 g Intravenous Every 4 hours 07/19/16 1912 07/19/16 1915   07/19/16 1800  ceFEPIme (MAXIPIME) 2 g in dextrose 5 % 50 mL IVPB  Status:  Discontinued     2 g 100 mL/hr over 30 Minutes Intravenous Every 24 hours 07/19/16 1646 07/19/16 1912   07/19/16 1100  vancomycin (VANCOCIN) 500 mg in sodium chloride 0.9 % 100 mL IVPB  Status:  Discontinued     500 mg 100 mL/hr over 60 Minutes Intravenous Every 12 hours 07/19/16 0953 07/19/16 1645   07/19/16 0800  piperacillin-tazobactam (ZOSYN) IVPB 3.375 g  Status:  Discontinued     3.375 g 12.5 mL/hr over 240 Minutes Intravenous Every 8 hours 07/19/16 0239 07/19/16 1645   07/19/16 0200  cefTRIAXone (ROCEPHIN) 2 g in dextrose 5 % 50 mL IVPB     2 g 100 mL/hr over 30 Minutes Intravenous  Once 07/19/16 0140 07/19/16 0259   07/19/16 0130  cefTRIAXone (ROCEPHIN) 1 g in dextrose 5 % 50  mL IVPB  Status:  Discontinued     1 g 100 mL/hr over 30 Minutes Intravenous  Once 07/19/16 0125 07/19/16 0140   07/19/16 0115  piperacillin-tazobactam (ZOSYN) IVPB 3.375 g     3.375 g 100 mL/hr over 30 Minutes Intravenous  Once 07/19/16 0109 07/19/16 0219   07/19/16 0115  vancomycin (VANCOCIN) IVPB 1000 mg/200 mL premix     1,000 mg 200 mL/hr over 60 Minutes Intravenous  Once 07/19/16 0109 07/19/16 0243      Medications: Scheduled Meds: . buprenorphine-naloxone  1 tablet Sublingual Daily  . Chlorhexidine Gluconate Cloth  6 each Topical Daily  . cloNIDine  0.3 mg Transdermal Weekly  . feeding supplement  1 Container Oral TID BM  . folic acid  1 mg Oral Daily  . LORazepam  1 mg Intravenous Once  . metoprolol tartrate  5 mg Intravenous Q6H  . nitroGLYCERIN  1 inch Topical Q6H  . sodium chloride flush  10-40 mL Intracatheter Q12H  . sodium chloride flush  3 mL Intravenous Q12H   Continuous Infusions: . sodium chloride 100 mL/hr at 08/08/16 1255  .  ceFEPime (MAXIPIME) IV Stopped (08/08/16 1821)  . heparin 1,600 Units/hr (08/08/16 0740)   PRN Meds:.acetaminophen **OR** acetaminophen, albuterol, fentaNYL (SUBLIMAZE) injection, haloperidol lactate, hydrALAZINE, LORazepam, ondansetron (ZOFRAN) IV, sodium chloride flush    Objective: Weight change: 0 lb (0 kg)  Intake/Output Summary (Last 24 hours) at 08/08/16 1919 Last data filed at 08/08/16 1800  Gross per 24 hour  Intake          2048.27 ml  Output              100 ml  Net          1948.27 ml   Blood pressure (P) 127/64, pulse 92, temperature 97.6 F (36.4 C), temperature source Oral, resp. rate 14, height 5\' 11"  (1.803 m), weight 175 lb (79.4 kg), SpO2 97 %. Temp:  [93.7 F (34.3 C)-98.1 F (36.7 C)] 97.6 F (36.4 C) (08/02 1147) Pulse Rate:  [73-99] 92 (08/02 1147) Resp:  [14-21] 14 (08/02 1147) BP: (122-169)/(69-102) (P) 127/64 (08/02 1651) SpO2:  [97 %-100 %] 97 % (08/02 1147) Weight:  [175 lb (79.4 kg)] 175 lb  (79.4 kg) (08/02 0221)  Physical Exam: General: somnolent, groaning to stimuli with incomprehensible speech  HEENT: anicteric sclera,  EOMI CVS tachy rate, normal r,  no murmur rubs or gallops Chest: fairly clear to auscultation bilaterally anteriorly no wheezing, Abdomen: soft nontender, nondistended, normal bowel sounds, Extremities: he has desqumation of skin on soles and other areas Neuro: nonfocal  CBC:  CBC Latest Ref Rng & Units 08/08/2016 08/07/2016 08/06/2016  WBC 4.0 - 10.5 K/uL 5.6 6.4 7.6  Hemoglobin 13.0 - 17.0 g/dL 8.2(L) 8.8(L) 8.6(L)  Hematocrit 39.0 - 52.0 % 25.2(L) 27.2(L) 26.6(L)  Platelets 150 - 400 K/uL 134(L) 150 176      BMET  Recent Labs  08/07/16 0521 08/08/16 0431  NA 140 142  K 3.8 3.4*  CL 119* 125*  CO2 15* 14*  GLUCOSE 90 124*  BUN 42* 49*  CREATININE 1.46* 1.79*  CALCIUM 8.9 8.8*     Liver Panel  No results for input(s): PROT, ALBUMIN, AST, ALT, ALKPHOS, BILITOT, BILIDIR, IBILI in the last 72 hours.     Sedimentation Rate No results for input(s): ESRSEDRATE in the last 72 hours. C-Reactive Protein No results for input(s): CRP in the last 72 hours.  Micro Results: Recent Results (from the past 720 hour(s))  Culture, blood (Routine x 2)     Status: Abnormal   Collection Time: 07/18/16 10:55 PM  Result Value Ref Range Status   Specimen Description BLOOD RIGHT ARM  Final   Special Requests IN PEDIATRIC BOTTLE Blood Culture adequate volume  Final   Culture  Setup Time   Final    GRAM POSITIVE COCCI IN CLUSTERS IN PEDIATRIC BOTTLE CRITICAL RESULT CALLED TO, READ BACK BY AND VERIFIED WITH: K HAMMONS,PHARMD AT 1619 07/19/16 BY L BENFIELD    Culture (A)  Final    STAPHYLOCOCCUS AUREUS SUSCEPTIBILITIES PERFORMED ON PREVIOUS CULTURE WITHIN THE LAST 5 DAYS.    Report Status 07/21/2016 FINAL  Final  Blood Culture ID Panel (Reflexed)     Status: Abnormal   Collection Time: 07/18/16 10:55 PM  Result Value Ref Range Status    Enterococcus species NOT DETECTED NOT DETECTED Final   Listeria monocytogenes NOT DETECTED NOT DETECTED Final   Staphylococcus species DETECTED (A) NOT DETECTED Final    Comment: CRITICAL RESULT CALLED TO, READ BACK BY AND VERIFIED WITH: K HAMMONS, PHARMD AT 1619 07/19/16 BY L BENFIELD  Staphylococcus aureus DETECTED (A) NOT DETECTED Final    Comment: Methicillin (oxacillin) susceptible Staphylococcus aureus (MSSA). Preferred therapy is anti staphylococcal beta lactam antibiotic (Cefazolin or Nafcillin), unless clinically contraindicated. CRITICAL RESULT CALLED TO, READ BACK BY AND VERIFIED WITH: K HAMMONS,PHARMD AT 1619 07/19/16 BY L BENFIELD    Methicillin resistance NOT DETECTED NOT DETECTED Final   Streptococcus species NOT DETECTED NOT DETECTED Final   Streptococcus agalactiae NOT DETECTED NOT DETECTED Final   Streptococcus pneumoniae NOT DETECTED NOT DETECTED Final   Streptococcus pyogenes NOT DETECTED NOT DETECTED Final   Acinetobacter baumannii NOT DETECTED NOT DETECTED Final   Enterobacteriaceae species NOT DETECTED NOT DETECTED Final   Enterobacter cloacae complex NOT DETECTED NOT DETECTED Final   Escherichia coli NOT DETECTED NOT DETECTED Final   Klebsiella oxytoca NOT DETECTED NOT DETECTED Final   Klebsiella pneumoniae NOT DETECTED NOT DETECTED Final   Proteus species NOT DETECTED NOT DETECTED Final   Serratia marcescens NOT DETECTED NOT DETECTED Final   Carbapenem resistance NOT DETECTED NOT DETECTED Final   Haemophilus influenzae NOT DETECTED NOT DETECTED Final   Neisseria meningitidis NOT DETECTED NOT DETECTED Final   Pseudomonas aeruginosa DETECTED (A) NOT DETECTED Final    Comment: CRITICAL RESULT CALLED TO, READ BACK BY AND VERIFIED WITH: K HAMMONS,PHARMD AT 1619 07/19/16 BY L BENFIELD    Candida albicans NOT DETECTED NOT DETECTED Final   Candida glabrata NOT DETECTED NOT DETECTED Final   Candida krusei NOT DETECTED NOT DETECTED Final   Candida parapsilosis NOT  DETECTED NOT DETECTED Final   Candida tropicalis NOT DETECTED NOT DETECTED Final  Culture, blood (Routine x 2)     Status: Abnormal   Collection Time: 07/18/16 11:45 PM  Result Value Ref Range Status   Specimen Description BLOOD LEFT ARM  Final   Special Requests   Final    BOTTLES DRAWN AEROBIC AND ANAEROBIC Blood Culture adequate volume   Culture  Setup Time   Final    GRAM POSITIVE COCCI IN CLUSTERS IN BOTH AEROBIC AND ANAEROBIC BOTTLES CRITICAL VALUE NOTED.  VALUE IS CONSISTENT WITH PREVIOUSLY REPORTED AND CALLED VALUE.    Culture STAPHYLOCOCCUS AUREUS PSEUDOMONAS AERUGINOSA  (A)  Final   Report Status 07/21/2016 FINAL  Final   Organism ID, Bacteria STAPHYLOCOCCUS AUREUS  Final   Organism ID, Bacteria PSEUDOMONAS AERUGINOSA  Final      Susceptibility   Pseudomonas aeruginosa - MIC*    CEFTAZIDIME 2 SENSITIVE Sensitive     CIPROFLOXACIN <=0.25 SENSITIVE Sensitive     GENTAMICIN <=1 SENSITIVE Sensitive     IMIPENEM 1 SENSITIVE Sensitive     PIP/TAZO 8 SENSITIVE Sensitive     CEFEPIME 2 SENSITIVE Sensitive     * PSEUDOMONAS AERUGINOSA   Staphylococcus aureus - MIC*    CIPROFLOXACIN <=0.5 SENSITIVE Sensitive     ERYTHROMYCIN 1 INTERMEDIATE Intermediate     GENTAMICIN <=0.5 SENSITIVE Sensitive     OXACILLIN <=0.25 SENSITIVE Sensitive     TETRACYCLINE <=1 SENSITIVE Sensitive     VANCOMYCIN 1 SENSITIVE Sensitive     TRIMETH/SULFA <=10 SENSITIVE Sensitive     CLINDAMYCIN RESISTANT Resistant     RIFAMPIN <=0.5 SENSITIVE Sensitive     Inducible Clindamycin POSITIVE Resistant     * STAPHYLOCOCCUS AUREUS  CSF culture     Status: None   Collection Time: 07/19/16  4:07 AM  Result Value Ref Range Status   Specimen Description CSF  Final   Special Requests NONE  Final   Gram Stain  Final    CYTOSPIN SMEAR WBC PRESENT, PREDOMINANTLY PMN NO ORGANISMS SEEN    Culture NO GROWTH 3 DAYS  Final   Report Status 07/22/2016 FINAL  Final  MRSA PCR Screening     Status: None   Collection  Time: 07/19/16  5:26 AM  Result Value Ref Range Status   MRSA by PCR NEGATIVE NEGATIVE Final    Comment:        The GeneXpert MRSA Assay (FDA approved for NASAL specimens only), is one component of a comprehensive MRSA colonization surveillance program. It is not intended to diagnose MRSA infection nor to guide or monitor treatment for MRSA infections.   Respiratory Panel by PCR     Status: None   Collection Time: 07/19/16 12:19 PM  Result Value Ref Range Status   Adenovirus NOT DETECTED NOT DETECTED Final   Coronavirus 229E NOT DETECTED NOT DETECTED Final   Coronavirus HKU1 NOT DETECTED NOT DETECTED Final   Coronavirus NL63 NOT DETECTED NOT DETECTED Final   Coronavirus OC43 NOT DETECTED NOT DETECTED Final   Metapneumovirus NOT DETECTED NOT DETECTED Final   Rhinovirus / Enterovirus NOT DETECTED NOT DETECTED Final   Influenza A NOT DETECTED NOT DETECTED Final   Influenza B NOT DETECTED NOT DETECTED Final   Parainfluenza Virus 1 NOT DETECTED NOT DETECTED Final   Parainfluenza Virus 2 NOT DETECTED NOT DETECTED Final   Parainfluenza Virus 3 NOT DETECTED NOT DETECTED Final   Parainfluenza Virus 4 NOT DETECTED NOT DETECTED Final   Respiratory Syncytial Virus NOT DETECTED NOT DETECTED Final   Bordetella pertussis NOT DETECTED NOT DETECTED Final   Chlamydophila pneumoniae NOT DETECTED NOT DETECTED Final   Mycoplasma pneumoniae NOT DETECTED NOT DETECTED Final  Culture, Urine     Status: Abnormal   Collection Time: 07/19/16 10:38 PM  Result Value Ref Range Status   Specimen Description URINE, CLEAN CATCH  Final   Special Requests NONE  Final   Culture MULTIPLE SPECIES PRESENT, SUGGEST RECOLLECTION (A)  Final   Report Status 07/21/2016 FINAL  Final  Culture, blood (routine x 2)     Status: None   Collection Time: 07/20/16  7:02 AM  Result Value Ref Range Status   Specimen Description BLOOD LEFT HAND  Final   Special Requests   Final    BOTTLES DRAWN AEROBIC ONLY Blood Culture  adequate volume   Culture NO GROWTH 5 DAYS  Final   Report Status 07/25/2016 FINAL  Final  Culture, blood (routine x 2)     Status: Abnormal   Collection Time: 07/20/16  7:03 AM  Result Value Ref Range Status   Specimen Description BLOOD RIGHT HAND  Final   Special Requests IN PEDIATRIC BOTTLE Blood Culture adequate volume  Final   Culture  Setup Time   Final    GRAM POSITIVE COCCI IN CLUSTERS IN PEDIATRIC BOTTLE CRITICAL RESULT CALLED TO, READ BACK BY AND VERIFIED WITH: TO JBATCHELDER(PHARMd) BY TCLEVELAND 07/21/2016 AT 4:57AM    Culture (A)  Final    STAPHYLOCOCCUS AUREUS SUSCEPTIBILITIES PERFORMED ON PREVIOUS CULTURE WITHIN THE LAST 5 DAYS.    Report Status 07/23/2016 FINAL  Final  Blood Culture ID Panel (Reflexed)     Status: Abnormal   Collection Time: 07/20/16  7:03 AM  Result Value Ref Range Status   Enterococcus species NOT DETECTED NOT DETECTED Final   Listeria monocytogenes NOT DETECTED NOT DETECTED Final   Staphylococcus species DETECTED (A) NOT DETECTED Final    Comment: CRITICAL RESULT CALLED TO, READ  BACK BY AND VERIFIED WITH: TO JBATCHELDER(PHARMd) BY TCLEVELAND 07/21/2016 AT 4:53AM    Staphylococcus aureus DETECTED (A) NOT DETECTED Final    Comment: Methicillin (oxacillin) susceptible Staphylococcus aureus (MSSA). Preferred therapy is anti staphylococcal beta lactam antibiotic (Cefazolin or Nafcillin), unless clinically contraindicated. CRITICAL RESULT CALLED TO, READ BACK BY AND VERIFIED WITH: TO JBATCHELDER(PHARMd) BY [facility] 07/21/2016 AT 4:53AM    Methicillin resistance NOT DETECTED NOT DETECTED Final   Streptococcus species NOT DETECTED NOT DETECTED Final   Streptococcus agalactiae NOT DETECTED NOT DETECTED Final   Streptococcus pneumoniae NOT DETECTED NOT DETECTED Final   Streptococcus pyogenes NOT DETECTED NOT DETECTED Final   Acinetobacter baumannii NOT DETECTED NOT DETECTED Final   Enterobacteriaceae species NOT DETECTED NOT DETECTED Final   Enterobacter  cloacae complex NOT DETECTED NOT DETECTED Final   Escherichia coli NOT DETECTED NOT DETECTED Final   Klebsiella oxytoca NOT DETECTED NOT DETECTED Final   Klebsiella pneumoniae NOT DETECTED NOT DETECTED Final   Proteus species NOT DETECTED NOT DETECTED Final   Serratia marcescens NOT DETECTED NOT DETECTED Final   Haemophilus influenzae NOT DETECTED NOT DETECTED Final   Neisseria meningitidis NOT DETECTED NOT DETECTED Final   Pseudomonas aeruginosa NOT DETECTED NOT DETECTED Final   Candida albicans NOT DETECTED NOT DETECTED Final   Candida glabrata NOT DETECTED NOT DETECTED Final   Candida krusei NOT DETECTED NOT DETECTED Final   Candida parapsilosis NOT DETECTED NOT DETECTED Final   Candida tropicalis NOT DETECTED NOT DETECTED Final  Culture, blood (single)     Status: None   Collection Time: 07/21/16  1:44 PM  Result Value Ref Range Status   Specimen Description BLOOD BLOOD RIGHT HAND  Final   Special Requests   Final    BOTTLES DRAWN AEROBIC AND ANAEROBIC Blood Culture adequate volume   Culture NO GROWTH 5 DAYS  Final   Report Status 07/26/2016 FINAL  Final  MRSA PCR Screening     Status: None   Collection Time: 07/23/16 10:30 AM  Result Value Ref Range Status   MRSA by PCR NEGATIVE NEGATIVE Final    Comment:        The GeneXpert MRSA Assay (FDA approved for NASAL specimens only), is one component of a comprehensive MRSA colonization surveillance program. It is not intended to diagnose MRSA infection nor to guide or monitor treatment for MRSA infections.   Urine culture     Status: None   Collection Time: 07/23/16  4:41 PM  Result Value Ref Range Status   Specimen Description URINE, CATHETERIZED  Final   Special Requests NONE  Final   Culture NO GROWTH  Final   Report Status 07/24/2016 FINAL  Final  Gram stain     Status: None   Collection Time: 08/01/16  3:09 PM  Result Value Ref Range Status   Specimen Description FLUID RIGHT PLEURAL  Final   Special Requests NONE   Final   Gram Stain   Final    ABUNDANT WBC PRESENT,BOTH PMN AND MONONUCLEAR NO ORGANISMS SEEN    Report Status 08/01/2016 FINAL  Final  Acid Fast Smear (AFB)     Status: None   Collection Time: 08/01/16  3:09 PM  Result Value Ref Range Status   AFB Specimen Processing Concentration  Final   Acid Fast Smear Negative  Final    Comment: (NOTE) Performed At: [facility] [address] [place], [place] [id number] Mila Homer MD [id number]    Source (AFB) FLUID  Final    Comment:  RIGHT PLEURAL   Fungus Culture With Stain     Status: None (Preliminary result)   Collection Time: 08/01/16  3:09 PM  Result Value Ref Range Status   Fungus Stain Final report  Final    Comment: (NOTE) Performed At: [facility] [address] [place], [place] [id number] Mila Homer MD JY:[id number]    Fungus (Mycology) Culture PENDING  Incomplete   Fungal Source FLUID  Final    Comment: RIGHT PLEURAL   Culture, body fluid-bottle     Status: None   Collection Time: 08/01/16  3:09 PM  Result Value Ref Range Status   Specimen Description FLUID RIGHT PLEURAL  Final   Special Requests BOTTLES DRAWN AEROBIC AND ANAEROBIC 10CC  Final   Culture NO GROWTH 5 DAYS  Final   Report Status 08/06/2016 FINAL  Final  Fungus Culture Result     Status: None   Collection Time: 08/01/16  3:09 PM  Result Value Ref Range Status   Result 1 Comment  Final    Comment: (NOTE) KOH/Calcofluor preparation:  no fungus observed. Performed At: [facility] [address] [place], [place] [id number] Mila Homer MD EX:[id number]     Studies/Results: Mr Brain Wo Contrast  Result Date: 08/08/2016 CLINICAL DATA:  Acute encephalopathy.  Headache EXAM: MRI HEAD WITHOUT CONTRAST TECHNIQUE: Multiplanar, multiecho pulse sequences of the brain and surrounding structures were obtained without intravenous contrast. COMPARISON:  CT head 08/01/2016 FINDINGS: Incomplete study. The patient was not  able to cooperate and complete the study. Axial and coronal diffusion-weighted imaging only was obtained and these are degraded by motion. No areas of restricted diffusion on diffusion-weighted imaging. Negative for hydrocephalus or fluid collection. Negative for mass lesion. IMPRESSION: Exam is limited to diffusion only. Negative for acute infarct. No area of restricted diffusion. Electronically Signed   By: Marlan Palau M.D.   On: 08/08/2016 16:43      Assessment/Plan:  INTERVAL HISTORY:  Pt remains confused   Principal Problem:   MSSA bacteremia Active Problems:   Sepsis (HCC)   UTI (urinary tract infection)   ARF (acute renal failure) (HCC)   Hyponatremia   Abnormal liver function   AKI (acute kidney injury) (HCC)   IVDU (intravenous drug user)   Meningitis   Pneumonia   Bacteremia due to Pseudomonas   Endocarditis of tricuspid valve   Bacteremia   Acute encephalopathy   Alcohol withdrawal syndrome with complication (HCC)   Opiate withdrawal (HCC)   Pleural effusion    Johnathan [name] is a 46 y.o. 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 He has had EEG which appears concerning for anoxic brain injury   #3 HCV: will recheck  VL and ensrue HIV and Hep B have been checked recently  His prognosis appears poor  I will sign off for now.   Please call with further questions.       LOS: 20 days   Acey Lav 08/08/2016, 7:19 PM

## 2016-08-08 NOTE — Progress Notes (Signed)
ANTICOAGULATION CONSULT NOTE - Follow Up Consult  Pharmacy Consult for Heparin Indication: DVT  No Known Allergies  Patient Measurements: Height: 5\' 11"  (180.3 cm) Weight: 175 lb (79.4 kg) IBW/kg (Calculated) : 75.3  Vital Signs: Temp: 98 F (36.7 C) (08/02 0358) Temp Source: Oral (08/02 0358) BP: 169/98 (08/02 0358) Pulse Rate: 99 (08/02 0358)  Labs:  Recent Labs  08/06/16 0543 08/07/16 0521 08/07/16 1459 08/08/16 0431  HGB 8.6* 8.8*  --  8.2*  HCT 26.6* 27.2*  --  25.2*  PLT 176 150  --  134*  HEPARINUNFRC 0.64 0.87* 0.53 0.66  CREATININE 1.36* 1.46*  --  1.79*    Estimated Creatinine Clearance: 55.5 mL/min (A) (by C-G formula based on SCr of 1.79 mg/dL (H)).   Medications:  Heparin @ 1600 units/hr  Assessment: 45yom continues on heparin for acute DVT in the radial veins of his left forearm. Heparin level remains therapeutic. His hemoglobin and platelets are low but stable.   Goal of Therapy:  Heparin level 0.3-0.7 units/ml Monitor platelets by anticoagulation protocol: Yes   Plan:  1) Continue heparin at 1600 units/hr 2) Follow up daily heparin level and CBC  Gwendy Boeder S 08/08/2016,8:30 AM

## 2016-08-08 NOTE — Progress Notes (Signed)
Pt flailing arms and legs inside mri scanner, attempted to crawl out of scanner. Pt tried to crawl out of bed upon transfer back to bed.

## 2016-08-08 NOTE — Progress Notes (Signed)
Bedside EEG completed, results pending. 

## 2016-08-08 NOTE — Procedures (Signed)
ELECTROENCEPHALOGRAM REPORT  Date of Study: 08/08/2016  Patient's Name: Johnathan [name] MRN: [id number] Date of Birth: 1970-03-02  Referring Provider: Dr. Thad Rangeripudeep Rai  Clinical History: This is a 46 year old man with altered mental status.  Medications: LORazepam (ATIVAN) injection 1 mg  haloperidol lactate (HALDOL) injection 2 mg  acetaminophen (TYLENOL) tablet 650 mg  albuterol (PROVENTIL) (2.5 MG/3ML) 0.083% nebulizer solution 2.5 mg  buprenorphine-naloxone (SUBOXONE) 8-2 mg per SL tablet 1 tablet  ceFEPIme (MAXIPIME) 2 g in dextrose 5 % 50 mL IVPB  Chlorhexidine Gluconate Cloth 2 % PADS 6 each  cloNIDine (CATAPRES - Dosed in mg/24 hr) patch 0.3 mg  feeding supplement (BOOST / RESOURCE BREEZE) liquid 1 Container  folic acid (FOLVITE) tablet 1 mg  heparin ADULT infusion 100 units/mL (25000 units/23850mL sodium chloride 0.45%)  hydrALAZINE (APRESOLINE) injection 20 mg  metoprolol tartrate (LOPRESSOR) injection 5 mg  nitroGLYCERIN (NITROGLYN) 2 % ointment 1 inch  ondansetron (ZOFRAN) injection 4 mg   Technical Summary: A multichannel digital EEG recording measured by the international 10-20 system with electrodes applied with paste and impedances below 5000 ohms performed as portable with EKG monitoring in an unresponsive patient.  Hyperventilation and photic stimulation were not performed.  The digital EEG was referentially recorded, reformatted, and digitally filtered in a variety of bipolar and referential montages for optimal display.   Description: The patient is unresponsive during the recording. There is no clear posterior dominant rhythm. The background consists of sleep architecture with a large amount of diffuse theta and delta slowing with poorly formed vertex waves and sleep spindles seen. With noxious stimulation, there is slight increase in faster frequencies. Hyperventilation and photic stimulation were not performed.  There were no epileptiform discharges or  electrographic seizures seen.    EKG lead was unremarkable.  Impression: This EEG is abnormal due to moderate diffuse slowing of the background.  Clinical Correlation of the above findings indicates diffuse cerebral dysfunction that is non-specific in etiology and can be seen with hypoxic/ischemic injury, toxic/metabolic encephalopathies, or medication effect from Ativan and Haldol given overnight.  The absence of epileptiform discharges does not rule out a clinical diagnosis of epilepsy.  Clinical correlation is advised.   Patrcia DollyKaren Aquino, M.D.

## 2016-08-08 NOTE — Progress Notes (Signed)
Physical Therapy Treatment Patient Details Name: Johnathan HolesChristopher XXXPallas MRN: [id number] DOB: [birth year] Today's Date: 08/08/2016    History of Present Illness Pt admitted 7/13 with sepsis, MSSA bacteremia, B LE celllulitis, tricuspid valve vegetation, R LL cavity PNA with pleural effusion, hep C. Developed encephalopathy and agitation and transferred to ICU 7/17. + DVT LUE. PMH: polysubstance abuse.    PT Comments    Patient more lethargic and sleepy today due to being given haldol and ativan last night. Tolerated sitting EOB for ~8 mins with Min A-Min guard assist. Demonstrates truncal ataxia. Trying to mumble some words but unintelligible. Session limited due to arousal and cognition. Continues to have poor attention. Will follow.    Follow Up Recommendations  SNF     Equipment Recommendations  Other (comment) (TBA next venue)    Recommendations for Other Services       Precautions / Restrictions Precautions Precautions: Fall Precaution Comments: pt easily agitated Restrictions Weight Bearing Restrictions: No    Mobility  Bed Mobility Overal bed mobility: Needs Assistance Bed Mobility: Supine to Sit;Sit to Supine     Supine to sit: Max assist;+2 for physical assistance Sit to supine: +2 for physical assistance;Total assist   General bed mobility comments: Assist to bring LEs off bed and to elevate trunk using pad; Resistant to return to supine, poor sequencing. Truncal ataxia sitting EOB.  Transfers                 General transfer comment: Deferred due to decreased level of arousal and safety.   Ambulation/Gait                 Stairs            Wheelchair Mobility    Modified Rankin (Stroke Patients Only) Modified Rankin (Stroke Patients Only) Pre-Morbid Rankin Score: No symptoms Modified Rankin: Moderately severe disability     Balance Overall balance assessment: Needs assistance Sitting-balance support: No upper extremity  supported;Feet supported Sitting balance-Leahy Scale: Fair Sitting balance - Comments: Initially requires Min A for balance due to trunal ataxia but able to catch self to prevent LOB on occasion. Initiated LAQ exercises with RLE x2. Postural control: Posterior lean                                  Cognition Arousal/Alertness: Lethargic;Suspect due to medications (given haldol and ativan last night) Behavior During Therapy: Flat affect Overall Cognitive Status: Impaired/Different from baseline Area of Impairment: Attention;Following commands;Safety/judgement;Problem solving                   Current Attention Level: Focused   Following Commands: Follows one step commands with increased time;Follows one step commands inconsistently Safety/Judgement: Decreased awareness of safety;Decreased awareness of deficits   Problem Solving: Slow processing;Decreased initiation;Difficulty sequencing;Requires verbal cues;Requires tactile cues General Comments: Pt trying to speak but it was garbled and unintelligible; squeezes hand on command. Eyes are open for some of session but only when stimulated then pts eyes remain closed.       Exercises General Exercises - Lower Extremity Long Arc Quad: AAROM;Right;5 reps;Seated;Left;Strengthening;PROM    General Comments General comments (skin integrity, edema, etc.): VSS throughout. HR up to 115 bpm. Elevated RR at times. All 4 rails up and bed in lowest position.      Pertinent Vitals/Pain Pain Assessment: Faces Faces Pain Scale: No hurt    Home Living  Prior Function            PT Goals (current goals can now be found in the care plan section) Progress towards PT goals: Not progressing toward goals - comment (limited due to arousal and cognition)    Frequency    Min 2X/week      PT Plan Current plan remains appropriate;Frequency needs to be updated    Co-evaluation               AM-PAC PT "6 Clicks" Daily Activity  Outcome Measure  Difficulty turning over in bed (including adjusting bedclothes, sheets and blankets)?: Total Difficulty moving from lying on back to sitting on the side of the bed? : Total Difficulty sitting down on and standing up from a chair with arms (e.g., wheelchair, bedside commode, etc,.)?: Total Help needed moving to and from a bed to chair (including a wheelchair)?: Total Help needed walking in hospital room?: Total Help needed climbing 3-5 steps with a railing? : Total 6 Click Score: 6    End of Session   Activity Tolerance: Patient limited by lethargy Patient left: in bed;with call bell/phone within reach;with bed alarm set;Other (comment) (bear hugger blanket) Nurse Communication: Mobility status PT Visit Diagnosis: Unsteadiness on feet (R26.81);Other symptoms and signs involving the nervous system (R29.898);Muscle weakness (generalized) (M62.81)     Time: [id number] PT Time Calculation (min) (ACUTE ONLY): 15 min  Charges:  $Therapeutic Activity: 8-22 mins                    G Codes:       Mylo RedShauna Juwan Vences, PT, DPT [phone]     Blake DivineShauna A Liann Spaeth 08/08/2016, 11:11 AM

## 2016-08-08 NOTE — Progress Notes (Signed)
[facility]                                                                              Patient Demographics  Johnathan [name], is a 46 y.o. male, DOB - 07-31-1970, WUG:[id number]  Admit date - 07/19/2016   Admitting Physician [name], MD  Outpatient Primary MD for the patient is [name], MD  Outpatient specialists:   LOS - 20  days   Medical records reviewed and are as summarized below:    Chief Complaint  Patient presents with  . Cellulitis       Brief summary   HPI On 07/19/2016 by Dr. [name] Johnathan [name] a 46 y.o.male,w Hep C?, IVDA, (recently injected opana) apparently c/o fever intermittently for the past 1 week, and redness over the dorsum of the right foot and slight swelling. Pt notes injecting Opana about 2 days ago. Pt denies any recent dental work. Pt presented to ED due to subjective fevers.  In ED, Pt found to have mild cellulitis of the right >left foot as well as uti wbc 6-30. Pt noted to be in ARF bun/creat 26/3.17, And to have abnormal LFT, Ast 47, Alt 25, Alk phos 206, T. Bili 1.6, Alb 2.4. And hyponatremia Na 123 , CXR right lung base slightly more patchy may reflect aspiration or pneumonia. Possible small right pleural effusion Pt will be admitted for sepsis secondary to uti vs cellulitis and ARF and hyponatremia. (note LP pending due to ? Neck stiffness) Since his admission he has been found to have MSSA bacteremia, possible meningitis, and LE cellulitis. TTE showed EF 88-82%, grade 1 diastolic dysfunction, and tricuspid valve with a mobile vegetation. On 7/17 he developed acute encephalopathy with agitation, becoming belligerent and combative. He was placed on CIWA protocol, however patient was not responsive to ativan and required transfer to the ICU for a precedex gtt. S/p thoracentesis.   Assessment & Plan     Right lung cavitary pneumonia with pleural effusion - Septic emboli versus  aspiration, PCCM and ID consulted. - IR consulted, status post right-sided thoracentesis, 1.5 L of yellow fluid removed - Fluid cultures with no growth, no organisms. Fluid fungal and acid-fast cultures negative so far - Cytology negative - Respiratory status stable, O2 sats 97% on room air however overnight patient was hypothermic, was placed on Bair hugger   MSSA bacteremia/endocarditis of the tricuspid valve, pseudomonas bacteremia - Positive blood cultures for pseudomonas and MSSA, ID following - Continue cefepime - 2-D echo, TEE showed tricuspid valve endocarditis, EF 80-03%, grade 1 diastolic dysfunction. - Given patient's IV drug abuse, patient will need prolonged hospital stay for antibiotics versus SNF. PICC ordered, however if patient chooses to leave AMA and is oriented, PICC line will need to be removed .  Acute encephalopathy/withdrawal - Currently still confused, he required precedex for agitation and withdrawal in ICU. - CT head unremarkable x2, ammonia level normal - Overnight patient more confused, hypothermic, agitated requiring Haldol and Ativan - Reviewed ID recommendations, ordered MRI of the brain to assess for any evidence of emboli, EEG to rule out seizures - EEG negative for seizure activity, MRI  brain pending   Polysubstance abuse - Continue Suboxone for now  Acute kidney injury - Likely secondary to sepsis and dehydration. - Renal ultrasound showed mild right pelvic caliectasis with a ureteral jet noted and urinary bladder excluding significant ureteral obstruction. Creatinine on admission 3.17 - Creatinine worsening, likely due to poor oral intake, increased IV fluids to 100 mL an hour  Accelerated hypertension - Currently stable, continue metoprolol, Nitropaste, clonidine patch and hydralazine as needed  Acute LUE DVT - Continue heparin drip per pharmacy  Moderate malnutrition/hypoalbuminemia - Continue nutritional supplements, dysphagia  diet  Abnormal thyroid testing - TSH is 0.20, T4 2.5 likely due to euthyroid sick syndrome due to acute critical illness - Follow up outpatient  Normocytic anemia - Currently on heparin drip, H&H stable, - Ferritin 609, acute phase reactant, iron profile will need to be repeated again closer to discharge  Cellulitis of the right lower extremity - Resolved  History of hepatitis C - LFTs normal, patient should follow outpatient PCP and GI referral  Code Status: Full CODE STATUS DVT Prophylaxis: Heparin Family Communication: No family member at the bedside Disposition Plan:   Time Spent in minutes  25 minutes  Procedures:  Lumbar puncture Renal US Echocardiogram TEE Lower extremity Doppler Left upper extremity Doppler US Guided Right thoracentesis  PICC line placement   Consultants:   Infectious disease PCCM Cardiology Interventional radiology  Antimicrobials:   IV cefepime, 7/ 16>> 8/26  Medications  Scheduled Meds: . buprenorphine-naloxone  1 tablet Sublingual Daily  . Chlorhexidine Gluconate Cloth  6 each Topical Daily  . cloNIDine  0.3 mg Transdermal Weekly  . feeding supplement  1 Container Oral TID BM  . folic acid  1 mg Oral Daily  . LORazepam  1 mg Intravenous Once  . metoprolol tartrate  5 mg Intravenous Q6H  . nitroGLYCERIN  1 inch Topical Q6H  . sodium chloride flush  10-40 mL Intracatheter Q12H  . sodium chloride flush  3 mL Intravenous Q12H   Continuous Infusions: . sodium chloride 50 mL (08/07/16 1507)  . ceFEPime (MAXIPIME) IV Stopped (08/08/16 1214)  . heparin 1,600 Units/hr (08/08/16 0740)   PRN Meds:.acetaminophen **OR** acetaminophen, albuterol, fentaNYL (SUBLIMAZE) injection, haloperidol lactate, hydrALAZINE, LORazepam, ondansetron (ZOFRAN) IV, sodium chloride flush   Antibiotics   Anti-infectives    Start     Dose/Rate Route Frequency Ordered Stop   07/22/16 1400  ceFEPIme (MAXIPIME) 2 g in dextrose 5 % 50 mL IVPB     2 g 100  mL/hr over 30 Minutes Intravenous Every 8 hours 07/22/16 1122 09/01/16 0259   07/20/16 1500  ceFEPIme (MAXIPIME) 2 g in dextrose 5 % 50 mL IVPB  Status:  Discontinued     2 g 100 mL/hr over 30 Minutes Intravenous Every 12 hours 07/20/16 1410 07/22/16 1122   07/19/16 2300  vancomycin (VANCOCIN) IVPB 750 mg/150 ml premix  Status:  Discontinued     750 mg 150 mL/hr over 60 Minutes Intravenous Every 24 hours 07/19/16 0237 07/19/16 0953   07/19/16 2200  ceFEPIme (MAXIPIME) 2 g in dextrose 5 % 50 mL IVPB  Status:  Discontinued     2 g 100 mL/hr over 30 Minutes Intravenous Every 24 hours 07/19/16 1931 07/20/16 1410   07/19/16 2000  nafcillin 2 g in dextrose 5 % 100 mL IVPB  Status:  Discontinued     2 g 200 mL/hr over 30 Minutes Intravenous Every 4 hours 07/19/16 1915 07/22/16 1130   07/19/16 1915  nafcillin injection  2 g  Status:  Discontinued     2 g Intravenous Every 4 hours 07/19/16 1912 07/19/16 1915   07/19/16 1800  ceFEPIme (MAXIPIME) 2 g in dextrose 5 % 50 mL IVPB  Status:  Discontinued     2 g 100 mL/hr over 30 Minutes Intravenous Every 24 hours 07/19/16 1646 07/19/16 1912   07/19/16 1100  vancomycin (VANCOCIN) 500 mg in sodium chloride 0.9 % 100 mL IVPB  Status:  Discontinued     500 mg 100 mL/hr over 60 Minutes Intravenous Every 12 hours 07/19/16 0953 07/19/16 1645   07/19/16 0800  piperacillin-tazobactam (ZOSYN) IVPB 3.375 g  Status:  Discontinued     3.375 g 12.5 mL/hr over 240 Minutes Intravenous Every 8 hours 07/19/16 0239 07/19/16 1645   07/19/16 0200  cefTRIAXone (ROCEPHIN) 2 g in dextrose 5 % 50 mL IVPB     2 g 100 mL/hr over 30 Minutes Intravenous  Once 07/19/16 0140 07/19/16 0259   07/19/16 0130  cefTRIAXone (ROCEPHIN) 1 g in dextrose 5 % 50 mL IVPB  Status:  Discontinued     1 g 100 mL/hr over 30 Minutes Intravenous  Once 07/19/16 0125 07/19/16 0140   07/19/16 0115  piperacillin-tazobactam (ZOSYN) IVPB 3.375 g     3.375 g 100 mL/hr over 30 Minutes Intravenous  Once  07/19/16 0109 07/19/16 0219   07/19/16 0115  vancomycin (VANCOCIN) IVPB 1000 mg/200 mL premix     1,000 mg 200 mL/hr over 60 Minutes Intravenous  Once 07/19/16 0109 07/19/16 0243        Subjective:   Johnathan [name] was seen and examined today. Overnight issues noted, was hypothermic, and he did bear hugger, was somewhat more agitated needing Haldol and Ativan. Unable to obtain review of systems from the patient. Events notified to me by RN   Objective:   Vitals:   08/08/16 0221 08/08/16 0358 08/08/16 0828 08/08/16 1147  BP:  (!) 169/98 122/69 (!) 146/69  Pulse:  99 84 92  Resp:  _0 Temp:  98 F (36.7 C) 98.1 F (36.7 C) 97.6 F (36.4 C)  TempSrc:  Oral Axillary Oral  SpO2:  99% 98% 97%  Weight: 79.4 kg (175 lb)     Height:        Intake/Output Summary (Last 24 hours) at 08/08/16 1252 Last data filed at 08/08/16 1000  Gross per 24 hour  Intake          1537.64 ml  Output              865 ml  Net           672.64 ml     Wt Readings from Last 3 Encounters:  08/08/16 79.4 kg (175 lb)  09/12/14 75.3 kg (166 lb)     Exam   General: Confused, somnolent  Eyes: PERRLA, EOMI, Anicteric Sclera,  HEENT:    Cardiovascular: S1 S2 auscultated. Regular rate and rhythm.   Respiratory: Clear to auscultation bilaterally, no wheezing, rales or rhonchi  Gastrointestinal: Soft, nontender, nondistended, + bowel sounds  Ext: no pedal edema bilaterally  Neuro: not following any commands  Musculoskeletal: No digital cyanosis, clubbing  Skin:   Psych: confused, somnolent   Data Reviewed:  I have personally reviewed following labs and imaging studies  Micro Results Recent Results (from the past 240 hour(s))  Gram stain     Status: None   Collection Time: 08/01/16  3:09 PM  Result Value Ref Range Status  Specimen Description FLUID RIGHT PLEURAL  Final   Special Requests NONE  Final   Gram Stain   Final    ABUNDANT WBC PRESENT,BOTH PMN AND  MONONUCLEAR NO ORGANISMS SEEN    Report Status 08/01/2016 FINAL  Final  Acid Fast Smear (AFB)     Status: None   Collection Time: 08/01/16  3:09 PM  Result Value Ref Range Status   AFB Specimen Processing Concentration  Final   Acid Fast Smear Negative  Final    Comment: (NOTE) Performed At: [facility] [place], [place] [id number] Lindon Romp MD [id number]    Source (AFB) FLUID  Final    Comment: RIGHT PLEURAL   Fungus Culture With Stain     Status: None (Preliminary result)   Collection Time: 08/01/16  3:09 PM  Result Value Ref Range Status   Fungus Stain Final report  Final    Comment: (NOTE) Performed At: [facility] [address] [place], [place] [id number] Lindon Romp MD [id number]    Fungus (Mycology) Culture PENDING  Incomplete   Fungal Source FLUID  Final    Comment: RIGHT PLEURAL   Culture, body fluid-bottle     Status: None   Collection Time: 08/01/16  3:09 PM  Result Value Ref Range Status   Specimen Description FLUID RIGHT PLEURAL  Final   Special Requests BOTTLES DRAWN AEROBIC AND ANAEROBIC 10CC  Final   Culture NO GROWTH 5 DAYS  Final   Report Status 08/06/2016 FINAL  Final  Fungus Culture Result     Status: None   Collection Time: 08/01/16  3:09 PM  Result Value Ref Range Status   Result 1 Comment  Final    Comment: (NOTE) KOH/Calcofluor preparation:  no fungus observed. Performed At: [facility] [place], [place] [id number] Lindon Romp MD [id number]     Radiology Reports Dg Chest 1 View  Result Date: 08/01/2016 CLINICAL DATA:  RIGHT pleural effusion EXAM: CHEST 1 VIEW COMPARISON:  CT chest 07/30/2016 FINDINGS: Normal heart size, mediastinal contours and pulmonary vascularity. Emphysematous changes with minimal central peribronchial thickening. Small RIGHT pleural effusion question extending into minor fissure. Atelectasis versus infiltrate retrocardiac LEFT lower  lobe. Remaining lungs clear. Two areas of nodularity in LEFT lung correspond to the nodular foci on recent CT. No pneumothorax. IMPRESSION: COPD changes with small RIGHT pleural effusion likely extending into minor fissure. Nodular areas LEFT lung better assessed on recent CT chest. Emphysema (ICD10-J43.9). Electronically Signed   By: Lavonia Dana M.D.   On: 08/01/2016 15:45   Dg Ankle 2 Views Left  Result Date: 07/29/2016 CLINICAL DATA:  Blunt trauma while transferring patient to bed EXAM: LEFT ANKLE - 2 VIEW COMPARISON:  None. FINDINGS: Mild soft tissue swelling is noted laterally. No acute fracture or dislocation is seen. No other focal abnormality is noted. IMPRESSION: Mild soft tissue swelling without acute bony abnormality. Electronically Signed   By: Inez Catalina M.D.   On: 07/29/2016 19:47   Ct Head Wo Contrast  Result Date: 08/01/2016 CLINICAL DATA:  Altered mental status EXAM: CT HEAD WITHOUT CONTRAST TECHNIQUE: Contiguous axial images were obtained from the base of the skull through the vertex without intravenous contrast. COMPARISON:  Head CT 07/30/2016 FINDINGS: Brain: No mass lesion, intraparenchymal hemorrhage or extra-axial collection. No evidence of acute cortical infarct. Brain parenchyma and CSF-containing spaces are normal for age. Vascular: No hyperdense vessel or unexpected calcification. Skull: Normal visualized skull base, calvarium and extracranial soft tissues.  Sinuses/Orbits: No sinus fluid levels or advanced mucosal thickening. No mastoid effusion. Normal orbits. IMPRESSION: Normal head CT. Electronically Signed   By: Ulyses Jarred M.D.   On: 08/01/2016 14:04   Ct Head Wo Contrast  Result Date: 07/30/2016 CLINICAL DATA:  Mental status change.  Pulmonary embolism. EXAM: CT HEAD WITHOUT CONTRAST TECHNIQUE: Contiguous axial images were obtained from the base of the skull through the vertex without intravenous contrast. COMPARISON:  CT head without contrast 07/22/2016 FINDINGS:  Brain: Mild generalized atrophy is again noted no acute infarct, hemorrhage, or mass lesion is present. There is no significant white matter disease. The ventricles are of normal size. No significant extraaxial fluid collection is present. The Vascular: No hyperdense vessel or unexpected calcification. Skull: The calvarium is intact. No focal lytic or blastic lesions are present. Sinuses/Orbits: The paranasal sinuses and mastoid air cells are clear. The globes and orbits are within normal limits bilaterally. IMPRESSION: 1. Stable mild generalized atrophy. 2. No acute intracranial abnormality. Electronically Signed   By: San Morelle M.D.   On: 07/30/2016 16:40   Ct Head W & Wo Contrast  Result Date: 07/22/2016 CLINICAL DATA:  Evaluate for septic emboli. IV drug abuser, with intermittent fever for the past 1 week. BILATERAL foot cellulitis. Hyponatremia. EXAM: CT HEAD WITHOUT AND WITH CONTRAST TECHNIQUE: Contiguous axial images were obtained from the base of the skull through the vertex without and with intravenous contrast CONTRAST:  38m ISOVUE-300 IOPAMIDOL (ISOVUE-300) INJECTION 61% COMPARISON:  CT head 08/23/2009. FINDINGS: Brain: No evidence of acute infarction, hemorrhage, hydrocephalus, extra-axial collection or mass lesion/mass effect. Premature for age cerebral and cerebellar atrophy. Post infusion, no abnormal enhancement of the brain or visible meninges. No vasogenic edema. Vascular: No hyperdense vessel or unexpected calcification. Visible vessels, including major dural venous sinuses, are patent. Skull: Normal. Negative for fracture or focal lesion. Sinuses/Orbits: No acute finding. Other: None. Compared with priors, there has been progressive loss of brain substance. IMPRESSION: Progressive atrophy since 2011.  No acute intracranial findings. No abnormal enhancement or vasogenic edema to suggest septic emboli to the brain. If no contraindications, MRI without and with contrast is more  sensitive in the detection of intracranial infection. Electronically Signed   By: JStaci RighterM.D.   On: 07/22/2016 15:55   Ct Angio Chest Pe W Or Wo Contrast  Result Date: 07/30/2016 CLINICAL DATA:  46year old with mental status change. Bacteremia. Preliminary results of a left upper extremity venous duplex demonstrated small thrombus in one of the radial veins. EXAM: CT ANGIOGRAPHY CHEST WITH CONTRAST TECHNIQUE: Multidetector CT imaging of the chest was performed using the standard protocol during bolus administration of intravenous contrast. Multiplanar CT image reconstructions and MIPs were obtained to evaluate the vascular anatomy. CONTRAST:  100 mL Isovue 370 COMPARISON:  Chest radiograph 07/28/2016 and chest CT 11/06/2003 FINDINGS: Cardiovascular: Negative for pulmonary embolism. Pulmonary arteries are well opacified on this examination. 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 Aorta measured 3.7 cm in 2005. Limited evaluation for dissection on this examination but no suspicious abnormalities in thoracic aorta other than the aneurysm. Heart size is within normal limits. No pericardial fluid. Mediastinum/Nodes: No significant chest lymphadenopathy but limited evaluation due to the extensive pleuroparenchymal lung disease. Lungs/Pleura: Large right pleural effusion and moderate-sized left pleural effusion. Severe emphysematous disease in the lungs. There is a combination of centrilobular and paraseptal emphysema. Near complete collapse and consolidation of the right lower lobe related to the right pleural effusion. There  is a prominent 1.1 cm nodule along the right minor fissure on sequence 6, image 97. There is focal consolidation along the posterior aspect of the right minor fissure which may be involving both the right upper lobe and the right middle lobe. This area roughly measures up to 3.4 cm on the soft tissue windows on sequence 5, image 90. This could represent a  cavitary lesion with adjacent pleural thickening. Volume loss in the posterior right upper lobe related to the large pleural effusion. There may be necrotic lung tissue in the posterior right upper lung on sequence 6, image 53. Large bleb at the left lung apex. Poorly defined hazy nodular structure in the left upper lobe with a branching configuration on sequence 6, image 57. Pleural-based nodular lesion in the lingula measuring up to 2.3 cm on sequence 6, image 18. Volume loss in the left lower lobe associated with the pleural fluid. Upper Abdomen: Upper abdominal ascites, particularly around the liver. Liver has a nodular contour and compatible with cirrhosis. Spleen is enlarged. Limited evaluation of the portal venous system on this examination. Musculoskeletal: No acute bone abnormality. Review of the MIP images confirms the above findings. IMPRESSION: Negative for a pulmonary embolism. Extensive pleural-parenchymal lung disease bilaterally. Large right pleural effusion and moderate-sized left pleural effusion. Pleural-based nodular opacities could represent an infectious etiology but also raise concern for a neoplastic process. Concern for a cavitary lesion and necrotic tissue in the right lung. Consider sampling of the pleural fluid. Recommend follow-up CT when the pleural fluid and acute process have resolved to exclude neoplastic disease. Severe emphysematous disease. Cirrhosis with evidence for portal hypertension based on the splenomegaly and ascites. 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 Recommend annual imaging followup by CTA or MRA. This recommendation follows 2010 ACCF/AHA/AATS/ACR/ASA/SCA/SCAI/SIR/STS/SVM Guidelines for the Diagnosis and Management of Patients with Thoracic Aortic Disease. Circulation. 2010; 121: Q469-G295 Electronically Signed   By: Markus Daft M.D.   On: 07/30/2016 17:05   US Renal  Result Date: 07/19/2016 CLINICAL DATA:  Acute renal failure EXAM:  RENAL / URINARY TRACT ULTRASOUND COMPLETE COMPARISON:  CT 08/12/2010 FINDINGS: Right Kidney: Length: 12.2 cm. Parenchyma isoechoic to adjacent liver. No focal lesion. Mild pelvicaliectasis. Left Kidney: Length: 12.9 cm. 1.1 x 0.4 cm cortical cyst, midpole. No solid mass or hydronephrosis. Bladder: Distended.  Right ureteral jet documented. There is a small amount of abdominal ascites identified. Bilateral pleural effusions noted. Accessory splenule. IMPRESSION: 1. Mild right pelvicaliectasis, with ureteral jet noted in the urinary bladder excluding significant ureteral obstruction. 2. Small left renal cyst. 3. Pleural effusions and abdominal ascites. Electronically Signed   By: Lucrezia Europe M.D.   On: 07/19/2016 13:17   Dg Chest Port 1 View  Result Date: 07/28/2016 CLINICAL DATA:  Pleural effusion. Pseudomonas bacteremia with acute tricuspid valve endocarditis. EXAM: PORTABLE CHEST 1 VIEW COMPARISON:  07/27/2016 and 07/25/2016 FINDINGS: Patient slightly rotated to the right. Lungs are adequately inflated demonstrate bilateral perihilar bibasilar opacification with slight interval worsening within the left base. Stable small to moderate right pleural effusion. Stable mild cardiomegaly. IMPRESSION: Persistent bilateral perihilar bibasilar opacification with slight interval worsening in the left base. Findings may be due to edema versus infection. Stable small to moderate right pleural effusion. Cardiomegaly. Electronically Signed   By: Marin Olp M.D.   On: 07/28/2016 07:33   Dg Chest Port 1 View  Result Date: 07/27/2016 CLINICAL DATA:  Acute respiratory failure EXAM: PORTABLE CHEST 1 VIEW COMPARISON:  07/25/2016  FINDINGS: Haziness of the right chest from pleural effusion. Interstitial coarsening above prior baseline. There are underlying COPD changes. Nodular density on the left was not seen 06/05/2016 and is likely acute rather than chronic nodule. Asymmetric right apical pleural thickening, chronic.  Chronic borderline cardiomegaly. No pneumothorax. IMPRESSION: 1. Layering right pleural effusion obscuring the right lower lung. 2. Probable pulmonary edema. 3. Emphysema. Electronically Signed   By: Monte Fantasia M.D.   On: 07/27/2016 07:14   Dg Chest Port 1 View  Result Date: 07/25/2016 CLINICAL DATA:  Acute respiratory failure EXAM: PORTABLE CHEST 1 VIEW COMPARISON:  Yesterday FINDINGS: Opacity with central lucency in the peripheral right chest. There is a small to moderate right pleural effusion. No pneumothorax. Emphysema. Cardiomegaly.  Stable aortic contours. IMPRESSION: 1. Stable from yesterday. 2. Probable pneumonia with concern for cavitation on the right. 3. Small to moderate right pleural effusion. 4.  Emphysema (ICD10-J43.9). Electronically Signed   By: Monte Fantasia M.D.   On: 07/25/2016 07:46   Dg Chest Port 1 View  Result Date: 07/24/2016 CLINICAL DATA:  Shortness of breath . EXAM: PORTABLE CHEST 1 VIEW COMPARISON:  07/23/2016 .  07/19/2016.  11/06/2003. FINDINGS: Cardiomegaly. Normal pulmonary vascularity. Persistent right lower lobe consolidation consistent with pneumonia. Cavitation may be present on today's exam. Mild left base atelectasis. Density noted over the left mid lung may represent atelectasis or small amount of fluid in the fissure. Left costophrenic angle incompletely imaged. No pneumothorax . No acute bony abnormality. IMPRESSION: 1. Persistent right lower lobe consolidation consistent with pneumonia. Cavitation may be present. Contrast-enhanced chest CT may prove useful for further evaluation. Persistent right-sided pleural effusion. 2. Left base subsegmental atelectasis. Density noted over the left mid lung field on today's exam may represent atelectasis. This could also represent fissural fluid. Electronically Signed   By: Marcello Moores  Register   On: 07/24/2016 07:04   Dg Chest Port 1 View  Result Date: 07/23/2016 CLINICAL DATA:  Shortness of breath and chest pain EXAM:  PORTABLE CHEST 1 VIEW COMPARISON:  July 19, 2016 FINDINGS: There is loculated effusion in the right base with patchy airspace consolidation in the right lower lobe. There is mild scarring in the upper lobes bilaterally. There is patchy atelectasis in the lower lobes. No pneumothorax. Heart size and pulmonary vascularity are normal. No evident bone lesions. IMPRESSION: Small right pleural effusion with consolidation in the right lower lobe, likely pneumonia. A areas of scarring and atelectasis noted as well. Cardiac silhouette stable. No evident pneumothorax. Electronically Signed   By: Lowella Grip III M.D.   On: 07/23/2016 09:51   Dg Chest Port 1 View  Result Date: 07/19/2016 CLINICAL DATA:  Cellulitis. Worsening swelling and redness of both feet. Intermittent fever. Code sepsis. EXAM: PORTABLE CHEST 1 VIEW COMPARISON:  Radiograph 06/05/2016 FINDINGS: Emphysema. Lung volumes from prior exam. Normal heart size and mediastinal contours for technique. There are streaky opacities in both lower lobes, with slightly more patchy right lung base opacity. Small right pleural effusion. No pneumothorax. No acute osseous abnormality. IMPRESSION: Emphysema. Streaky lower lobe opacities favoring atelectasis. Right lung base slightly more patchy which may reflect aspiration or pneumonia. Possible small right pleural effusion. Electronically Signed   By: Jeb Levering M.D.   On: 07/19/2016 01:53   Ir Thoracentesis Asp Pleural Space W/img Guide  Result Date: 08/01/2016 INDICATION: Shortness of breath. Large right pleural effusion. Request diagnostic and therapeutic thoracentesis. EXAM: ULTRASOUND GUIDED RIGHT THORACENTESIS MEDICATIONS: None. COMPLICATIONS: None immediate. Postprocedural chest x-ray negative for  pneumothorax. PROCEDURE: An ultrasound guided thoracentesis was thoroughly discussed with the patient and questions answered. The benefits, risks, alternatives and complications were also discussed. The  patient understands and wishes to proceed with the procedure. Written consent was obtained. Ultrasound was performed to localize and mark an adequate pocket of fluid in the right chest. The area was then prepped and draped in the normal sterile fashion. 1% Lidocaine was used for local anesthesia. Under ultrasound guidance a Safe-T-Centesis catheter was introduced. Thoracentesis was performed. The catheter was removed and a dressing applied. FINDINGS: A total of approximately 1.5 L of hazy, yellow fluid was removed. Samples were sent to the laboratory as requested by the clinical team. IMPRESSION: Successful ultrasound guided right thoracentesis yielding 1.5 L of pleural fluid. Read by: Ascencion Dike PA-C Electronically Signed   By: Sandi Mariscal M.D.   On: 08/01/2016 15:34    Lab Data:  CBC:  Recent Labs Lab 08/04/16 0352 08/05/16 0450 08/06/16 0543 08/07/16 0521 08/08/16 0431  WBC 6.3 7.0 7.6 6.4 5.6  HGB 8.6* 8.5* 8.6* 8.8* 8.2*  HCT 26.6* 26.4* 26.6* 27.2* 25.2*  MCV 85.5 86.8 88.7 87.5 87.8  PLT 229 203 176 150 161*   Basic Metabolic Panel:  Recent Labs Lab 08/04/16 0352 08/05/16 0450 08/06/16 0543 08/07/16 0521 08/08/16 0431  NA 139 142 140 140 142  K 3.7 3.7 3.8 3.8 3.4*  CL 120* 122* 120* 119* 125*  CO2 16* 17* 17* 15* 14*  GLUCOSE 254* 109* 114* 90 124*  BUN 42* 42* 42* 42* 49*  CREATININE 1.08 1.23 1.36* 1.46* 1.79*  CALCIUM 8.6* 8.9 9.0 8.9 8.8*   GFR: Estimated Creatinine Clearance: 55.5 mL/min (A) (by C-G formula based on SCr of 1.79 mg/dL (H)). Liver Function Tests: No results for input(s): AST, ALT, ALKPHOS, BILITOT, PROT, ALBUMIN in the last 168 hours. No results for input(s): LIPASE, AMYLASE in the last 168 hours.  Recent Labs Lab 08/02/16 1131  AMMONIA 28   Coagulation Profile: No results for input(s): INR, PROTIME in the last 168 hours. Cardiac Enzymes: No results for input(s): CKTOTAL, CKMB, CKMBINDEX, TROPONINI in the last 168 hours. BNP (last 3  results) No results for input(s): PROBNP in the last 8760 hours. HbA1C: No results for input(s): HGBA1C in the last 72 hours. CBG:  Recent Labs Lab 08/08/16 0357 08/08/16 0523 08/08/16 0602 08/08/16 0826 08/08/16 1146  GLUCAP 67 66 112* 57* 67   Lipid Profile: No results for input(s): CHOL, HDL, LDLCALC, TRIG, CHOLHDL, LDLDIRECT in the last 72 hours. Thyroid Function Tests: No results for input(s): TSH, T4TOTAL, FREET4, T3FREE, THYROIDAB in the last 72 hours. Anemia Panel: No results for input(s): VITAMINB12, FOLATE, FERRITIN, TIBC, IRON, RETICCTPCT in the last 72 hours. Urine analysis:    Component Value Date/Time   COLORURINE AMBER (A) 07/28/2016 1622   APPEARANCEUR TURBID (A) 07/28/2016 1622   LABSPEC 1.034 (H) 07/28/2016 1622   PHURINE 5.0 07/28/2016 1622   GLUCOSEU 50 (A) 07/28/2016 1622   HGBUR LARGE (A) 07/28/2016 1622   BILIRUBINUR SMALL (A) 07/28/2016 1622   KETONESUR 5 (A) 07/28/2016 1622   PROTEINUR >=300 (A) 07/28/2016 1622   NITRITE NEGATIVE 07/28/2016 1622   LEUKOCYTESUR NEGATIVE 07/28/2016 1622     Johnathan [name] M.D. [facility] 08/08/2016, 12:52 PM  Pager: [phone] Between 7am to 7pm - call Pager - [phone]  After 7pm go to [email or web address] - password [email or web address]  Call night coverage person covering after 7pm

## 2016-08-09 ENCOUNTER — Inpatient Hospital Stay (HOSPITAL_COMMUNITY): Payer: Medicaid Other

## 2016-08-09 DIAGNOSIS — R0600 Dyspnea, unspecified: Secondary | ICD-10-CM

## 2016-08-09 LAB — BODY FLUID CELL COUNT WITH DIFFERENTIAL
EOS FL: 0 %
Lymphs, Fluid: 39 %
MONOCYTE-MACROPHAGE-SEROUS FLUID: 30 % — AB (ref 50–90)
Neutrophil Count, Fluid: 31 % — ABNORMAL HIGH (ref 0–25)
Total Nucleated Cell Count, Fluid: 78 cu mm (ref 0–1000)

## 2016-08-09 LAB — GLUCOSE, CAPILLARY
GLUCOSE-CAPILLARY: 76 mg/dL (ref 65–99)
GLUCOSE-CAPILLARY: 81 mg/dL (ref 65–99)
GLUCOSE-CAPILLARY: 71 mg/dL (ref 65–99)
GLUCOSE-CAPILLARY: 84 mg/dL (ref 65–99)
GLUCOSE-CAPILLARY: 98 mg/dL (ref 65–99)
Glucose-Capillary: 54 mg/dL — ABNORMAL LOW (ref 65–99)
Glucose-Capillary: 78 mg/dL (ref 65–99)
Glucose-Capillary: 78 mg/dL (ref 65–99)
Glucose-Capillary: 90 mg/dL (ref 65–99)

## 2016-08-09 LAB — ECHOCARDIOGRAM COMPLETE
CHL CUP DOP CALC LVOT VTI: 18.2 cm
CHL CUP MV DEC (S): 236
E/e' ratio: 5.19
EWDT: 236 ms
FS: 26 % — AB (ref 28–44)
HEIGHTINCHES: 71 in
IV/PV OW: 1.03
LADIAMINDEX: 1.88 cm/m2
LASIZE: 38 mm
LAVOL: 44 mL
LAVOLA4C: 47.5 mL
LAVOLIN: 21.8 mL/m2
LEFT ATRIUM END SYS DIAM: 38 mm
LV E/e' medial: 5.19
LV E/e'average: 5.19
LV PW d: 10.2 mm — AB (ref 0.6–1.1)
LVELAT: 9.25 cm/s
LVOT area: 4.52 cm2
LVOT peak grad rest: 2 mmHg
LVOT peak vel: 77.7 cm/s
LVOTD: 24 mm
LVOTSV: 82 mL
MV pk E vel: 48 m/s
MVPKAVEL: 50.6 m/s
RV LATERAL S' VELOCITY: 14.4 cm/s
RV sys press: 19 mmHg
Reg peak vel: 202 cm/s
TAPSE: 24.8 mm
TDI e' lateral: 9.25
TDI e' medial: 6.09
TRMAXVEL: 202 cm/s
WEIGHTICAEL: 2880 [oz_av]

## 2016-08-09 LAB — BLOOD GAS, ARTERIAL
ACID-BASE DEFICIT: 13.2 mmol/L — AB (ref 0.0–2.0)
BICARBONATE: 13.2 mmol/L — AB (ref 20.0–28.0)
DRAWN BY: 39899
FIO2: 100
LHR: 20 {breaths}/min
MECHVT: 600 mL
O2 Saturation: 95.2 %
PEEP/CPAP: 5 cmH2O
Patient temperature: 98.6
pCO2 arterial: 34.6 mmHg (ref 32.0–48.0)
pH, Arterial: 7.205 — ABNORMAL LOW (ref 7.350–7.450)
pO2, Arterial: 73 mmHg — ABNORMAL LOW (ref 83.0–108.0)

## 2016-08-09 LAB — TROPONIN I
Troponin I: <0.03 ng/mL (ref <0.03)
Troponin I: <0.03 ng/mL (ref <0.03)

## 2016-08-09 LAB — BASIC METABOLIC PANEL
Anion gap: 3 — ABNORMAL LOW (ref 5–15)
BUN: 54 mg/dL — AB (ref 6–20)
CO2: 15 mmol/L — AB (ref 22–32)
Calcium: 8.8 mg/dL — ABNORMAL LOW (ref 8.9–10.3)
Chloride: 124 mmol/L — ABNORMAL HIGH (ref 101–111)
Creatinine, Ser: 2.38 mg/dL — ABNORMAL HIGH (ref 0.61–1.24)
GFR calc Af Amer: 36 mL/min — ABNORMAL LOW (ref >60)
GFR, EST NON AFRICAN AMERICAN: 31 mL/min — AB (ref >60)
GLUCOSE: 76 mg/dL (ref 65–99)
POTASSIUM: 3.9 mmol/L (ref 3.5–5.1)
Sodium: 142 mmol/L (ref 135–145)

## 2016-08-09 LAB — HEPARIN LEVEL (UNFRACTIONATED): HEPARIN UNFRACTIONATED: 0.73 [IU]/mL — AB (ref 0.30–0.70)

## 2016-08-09 LAB — CBC
HCT: 24.9 % — ABNORMAL LOW (ref 39.0–52.0)
HEMOGLOBIN: 8.1 g/dL — AB (ref 13.0–17.0)
MCH: 28.7 pg (ref 26.0–34.0)
MCHC: 32.5 g/dL (ref 30.0–36.0)
MCV: 88.3 fL (ref 78.0–100.0)
PLATELETS: 146 10*3/uL — AB (ref 150–400)
RBC: 2.82 MIL/uL — ABNORMAL LOW (ref 4.22–5.81)
RDW: 21.8 % — ABNORMAL HIGH (ref 11.5–15.5)
WBC: 7.1 10*3/uL (ref 4.0–10.5)

## 2016-08-09 LAB — CORTISOL: CORTISOL PLASMA: 11.5 ug/dL

## 2016-08-09 LAB — PHOSPHORUS
PHOSPHORUS: 4.3 mg/dL (ref 2.5–4.6)
Phosphorus: 4.3 mg/dL (ref 2.5–4.6)
Phosphorus: 3.3 mg/dL (ref 2.5–4.6)

## 2016-08-09 LAB — MAGNESIUM
MAGNESIUM: 1.4 mg/dL — AB (ref 1.7–2.4)
Magnesium: 1.4 mg/dL — ABNORMAL LOW (ref 1.7–2.4)
Magnesium: 1.5 mg/dL — ABNORMAL LOW (ref 1.7–2.4)

## 2016-08-09 LAB — PROCALCITONIN: PROCALCITONIN: 0.72 ng/mL

## 2016-08-09 MED ORDER — DOCUSATE SODIUM 50 MG/5ML PO LIQD: 100.0000 mg | Freq: Two times a day (BID) | ORAL | Status: DC | PRN

## 2016-08-09 MED ORDER — FENTANYL CITRATE (PF) 100 MCG/2ML IJ SOLN
INTRAMUSCULAR | Status: AC
  Administered 2016-08-09: 100 ug via INTRAVENOUS
  Filled 2016-08-09: qty 2

## 2016-08-09 MED ORDER — FOLIC ACID 1 MG PO TABS
1.0000 mg | ORAL_TABLET | Freq: Every day | ORAL | Status: DC
  Administered 2016-08-10 – 2016-08-19 (×9): 1 mg
  Filled 2016-08-09 (×11): qty 1

## 2016-08-09 MED ORDER — LACTATED RINGERS IV BOLUS (SEPSIS)
1000.0000 mL | Freq: Once | INTRAVENOUS | Status: AC
  Administered 2016-08-09: 1000 mL via INTRAVENOUS

## 2016-08-09 MED ORDER — PRO-STAT SUGAR FREE PO LIQD: 30.0000 mL | Freq: Two times a day (BID) | ORAL | Status: DC

## 2016-08-09 MED ORDER — VANCOMYCIN HCL 10 G IV SOLR
1500.0000 mg | Freq: Once | INTRAVENOUS | Status: AC
  Administered 2016-08-09: 1500 mg via INTRAVENOUS
  Filled 2016-08-09: qty 1500

## 2016-08-09 MED ORDER — CHLORHEXIDINE GLUCONATE CLOTH 2 % EX PADS
6.0000 | MEDICATED_PAD | Freq: Every day | CUTANEOUS | Status: DC
  Administered 2016-08-10: 6 via TOPICAL

## 2016-08-09 MED ORDER — PRO-STAT SUGAR FREE PO LIQD
30.0000 mL | Freq: Two times a day (BID) | ORAL | Status: DC
  Administered 2016-08-09 – 2016-08-15 (×11): 30 mL
  Filled 2016-08-09 (×14): qty 30

## 2016-08-09 MED ORDER — DEXTROSE 5 % IV SOLN
2.0000 g | INTRAVENOUS | Status: DC
  Administered 2016-08-10: 2 g via INTRAVENOUS
  Filled 2016-08-09 (×2): qty 2

## 2016-08-09 MED ORDER — VITAL HIGH PROTEIN PO LIQD: 1000.0000 mL | ORAL | Status: DC

## 2016-08-09 MED ORDER — MAGNESIUM SULFATE 2 GM/50ML IV SOLN
2.0000 g | Freq: Once | INTRAVENOUS | Status: AC
  Administered 2016-08-09: 2 g via INTRAVENOUS
  Filled 2016-08-09: qty 50

## 2016-08-09 MED ORDER — VANCOMYCIN HCL 10 G IV SOLR
1250.0000 mg | INTRAVENOUS | Status: DC
  Administered 2016-08-10: 1250 mg via INTRAVENOUS
  Filled 2016-08-09 (×2): qty 1250

## 2016-08-09 MED ORDER — PROPOFOL 1000 MG/100ML IV EMUL
INTRAVENOUS | Status: AC
  Administered 2016-08-09: 20 ug/kg/min via INTRAVENOUS
  Filled 2016-08-09: qty 100

## 2016-08-09 MED ORDER — DEXTROSE-NACL 5-0.9 % IV SOLN
INTRAVENOUS | Status: DC
  Administered 2016-08-09 (×2): via INTRAVENOUS

## 2016-08-09 MED ORDER — SODIUM CHLORIDE 0.9 % IV SOLN
0.0000 ug/min | INTRAVENOUS | Status: DC
  Administered 2016-08-09: 10 ug/min via INTRAVENOUS
  Administered 2016-08-10: 30 ug/min via INTRAVENOUS
  Administered 2016-08-10: 40 ug/min via INTRAVENOUS
  Filled 2016-08-09 (×4): qty 1

## 2016-08-09 MED ORDER — PANTOPRAZOLE SODIUM 40 MG IV SOLR
40.0000 mg | Freq: Every day | INTRAVENOUS | Status: DC
  Administered 2016-08-09 – 2016-08-13 (×5): 40 mg via INTRAVENOUS
  Filled 2016-08-09 (×6): qty 40

## 2016-08-09 MED ORDER — DEXTROSE IN LACTATED RINGERS 5 % IV SOLN
INTRAVENOUS | Status: DC
  Administered 2016-08-09 (×2): via INTRAVENOUS
  Administered 2016-08-10: 100 mL/h via INTRAVENOUS
  Administered 2016-08-10: 1000 mL via INTRAVENOUS
  Administered 2016-08-11 – 2016-08-13 (×4): via INTRAVENOUS

## 2016-08-09 MED ORDER — SODIUM CHLORIDE 0.9 % IV BOLUS (SEPSIS)
500.0000 mL | Freq: Once | INTRAVENOUS | Status: AC
  Administered 2016-08-09: 500 mL via INTRAVENOUS

## 2016-08-09 MED ORDER — CHLORHEXIDINE GLUCONATE 0.12% ORAL RINSE (MEDLINE KIT)
15.0000 mL | Freq: Two times a day (BID) | OROMUCOSAL | Status: DC
  Administered 2016-08-09 – 2016-08-19 (×20): 15 mL via OROMUCOSAL

## 2016-08-09 MED ORDER — FENTANYL CITRATE (PF) 100 MCG/2ML IJ SOLN
100.0000 ug | INTRAMUSCULAR | Status: DC | PRN
  Administered 2016-08-11: 100 ug via INTRAVENOUS
  Filled 2016-08-09 (×2): qty 2

## 2016-08-09 MED ORDER — MIDAZOLAM HCL 2 MG/2ML IJ SOLN
INTRAMUSCULAR | Status: AC
  Administered 2016-08-09: 2 mg via INTRAVENOUS
  Filled 2016-08-09: qty 2

## 2016-08-09 MED ORDER — FENTANYL CITRATE (PF) 100 MCG/2ML IJ SOLN
100.0000 ug | INTRAMUSCULAR | Status: DC | PRN
  Administered 2016-08-11 (×2): 100 ug via INTRAVENOUS
  Filled 2016-08-09: qty 2

## 2016-08-09 MED ORDER — PROPOFOL 1000 MG/100ML IV EMUL
0.0000 ug/kg/min | INTRAVENOUS | Status: DC
  Administered 2016-08-09: 30 ug/kg/min via INTRAVENOUS
  Administered 2016-08-09: 25 ug/kg/min via INTRAVENOUS
  Administered 2016-08-10: 27.369 ug/kg/min via INTRAVENOUS
  Administered 2016-08-10 – 2016-08-11 (×3): 25 ug/kg/min via INTRAVENOUS
  Administered 2016-08-11: 30 ug/kg/min via INTRAVENOUS
  Administered 2016-08-11: 15 ug/kg/min via INTRAVENOUS
  Filled 2016-08-09 (×9): qty 100

## 2016-08-09 MED ORDER — MIDAZOLAM HCL 2 MG/2ML IJ SOLN
INTRAMUSCULAR | Status: AC
  Filled 2016-08-09: qty 2

## 2016-08-09 MED ORDER — ORAL CARE MOUTH RINSE
15.0000 mL | Freq: Four times a day (QID) | OROMUCOSAL | Status: DC
  Administered 2016-08-10 – 2016-08-19 (×39): 15 mL via OROMUCOSAL

## 2016-08-09 MED ORDER — VITAL AF 1.2 CAL PO LIQD
1000.0000 mL | ORAL | Status: DC
  Administered 2016-08-09 – 2016-08-14 (×6): 1000 mL

## 2016-08-09 NOTE — Progress Notes (Addendum)
Pharmacy Antibiotic Note  Johnathan [name] is a 46 year old male with hx Hep C, IVDA who presented on 7/13 with R-foot redness/swelling with fevers. The patient is noted to have MSSA + Psuedomonas bacteremia with TTE positive for tricuspid valve IE. ID on board. Currently on cefepime 2 g IV q 8 hours, planning to add vancomycin for new cavitary pneumonia. Pt required re-intubation this am. SCr up to 2.3, eCrCl 40-45 ml/min.   Plan: -Cefepime 2g/24h -Vancomycin 1500 mg IV x1 then 1250 mg IV q24h -Monitor renal fx, cultures, VT as needed   Height: _0  (180.3 cm) Weight: 180 lb (81.6 kg) IBW/kg (Calculated) : 75.3  Temp (24hrs), Avg:98 F (36.7 C), Min:97.1 F (36.2 C), Max:98.7 F (37.1 C)   Recent Labs Lab 08/05/16 0450 08/06/16 0543 08/07/16 0521 08/08/16 0431 08/09/16 0322  WBC 7.0 7.6 6.4 5.6 7.1  CREATININE 1.23 1.36* 1.46* 1.79* 2.38*     Zosyn 7/13 >> 7/13 Vanc 7/13 >> 7/13; 8/3 >> Cefepime 7/13 >> (8/25- stop date in) Nafcillin 7/13 >> 7/16  7/12 BCx - MSSA + PSA (pan sensitive) 7/13 CSF cx - negative 7/13 RVP- negative 7/13 MRSA PCR - negative 7/13 UCx - negative 7/14 BCx - 1/2 MSSA 7/15 BCx - neg 7/17 Urine- neg 7/26 pleural fluid- gram stain neg, cx ngF 7/26 AFB- neg 7/26 Fungal: in process  8/3 blood cx: 8/3 BAL:   Hughes Better, PharmD, BCPS Clinical Pharmacist 08/09/2016 12:04 PM

## 2016-08-09 NOTE — Progress Notes (Signed)
ANTICOAGULATION CONSULT NOTE - Follow Up Consult  Pharmacy Consult for Heparin  Indication: DVT  No Known Allergies  Patient Measurements: Height: 5\' 11"  (180.3 cm) Weight: 180 lb (81.6 kg) IBW/kg (Calculated) : 75.3  Vital Signs: Temp: 98.3 F (36.8 C) (08/03 0345) Temp Source: Oral (08/03 0345) BP: 136/86 (08/03 0345) Pulse Rate: 85 (08/03 0345)  Labs:  Recent Labs  08/07/16 0521 08/07/16 1459 08/08/16 0431 08/09/16 0322  HGB 8.8*  --  8.2* 8.1*  HCT 27.2*  --  25.2* 24.9*  PLT 150  --  134* 146*  HEPARINUNFRC 0.87* 0.53 0.66 0.73*  CREATININE 1.46*  --  1.79* 2.38*    Estimated Creatinine Clearance: 41.7 mL/min (A) (by C-G formula based on SCr of 2.38 mg/dL (H)).    Assessment: Heparin for upper extremity DVT, heparin level elevated this AM, no issues per RN.   Goal of Therapy:  Heparin level 0.3-0.7 units/ml Monitor platelets by anticoagulation protocol: Yes   Plan:  -Dec heparin to 1500 units/hr -1200 HL  Abran DukeLedford, [address] 08/09/2016,4:31 AM

## 2016-08-09 NOTE — Progress Notes (Signed)
SLP Cancellation Note  Patient Details Name: Johnathan [name] MRN: [id number] DOB: 03-Aug-1970   Cancelled treatment:       Reason Eval/Treat Not Completed: Medical issues which prohibited therapy - pt now intubated.   Johnathan [name], Johnathan [name] 08/09/2016, 11:26 AM  Johnathan [name], M.A. CCC-SLP [phone]

## 2016-08-09 NOTE — Procedures (Signed)
Intubation Procedure Note Johnathan [name] [id number] 1970/11/12  Procedure: Intubation Indications: Respiratory insufficiency  Procedure Details Consent: Unable to obtain consent because of emergent medical necessity. Time Out: Verified patient identification, verified procedure, site/side was marked, verified correct patient position, special equipment/implants available, medications/allergies/relevent history reviewed, required imaging and test results available.  Performed  Maximum sterile technique was used including gloves, gown, hand hygiene and mask.  MAC and 4    Evaluation Hemodynamic Status: BP stable throughout; O2 sats: stable throughout Patient's Current Condition: stable Complications: No apparent complications Patient did tolerate procedure well. Chest X-ray ordered to verify placement.  CXR: pending.   Johnathan [name] 08/09/2016  ett was NOT traumtic with glide at all  After placement extensive bleed bright comgin up from airway Bronch sought

## 2016-08-09 NOTE — Procedures (Signed)
Bronchoscopy Procedure Note Johnathan [name] [id number] Jul 20, 1970  Procedure: Bronchoscopy Indications: Diagnostic evaluation of the airways, Remove secretions and assess dah?  Procedure Details Consent: Unable to obtain consent because of altered level of consciousness. Time Out: Verified patient identification, verified procedure, site/side was marked, verified correct patient position, special equipment/implants available, medications/allergies/relevent history reviewed, required imaging and test results available.  Performed  In preparation for procedure, patient was given 100% FiO2. Sedation: prop, roc  Airway entered and the following bronchi were examined: RUL, RML, RLL, LUL, LLL and Bronchi.   Procedures performed: Brushings performed - no Bronchoscope removed.  , Patient placed back on 100% FiO2 at conclusion of procedure.    Evaluation Hemodynamic Status: BP stable throughout; O2 sats: stable throughout Patient's Current Condition: stable Specimens:  Sent serosanguinous fluid - frank blood Complications: No apparent complications Patient did tolerate procedure well.   [name]. 08/09/2016   1. DAH, mild diffuse, not clear with suctioning well 2. No mass lesions 3. BAL lingula  Hep dc  [name]. Titus Mould, MD, [place] Pgr: [facility]

## 2016-08-09 NOTE — Progress Notes (Signed)
CSW following for SNF placement when stable- pt transferred to ICU and intubated at this time  Burna SisJenna H. Mella Inclan, LCSW Clinical Social Worker [phone]

## 2016-08-09 NOTE — Significant Event (Signed)
Rapid Response Event Note  Overview:  Called to assist with patient with severe resp distress Time Called: 0946 Arrival Time: 0948 Event Type: Respiratory  Initial Focused Assessment:  On arrival Dr. Isidoro Donningai and Dr. Tyson AliasFeinstein at bedside - requesting to intubate patient.  Severe resps distress - on NRB mask.  Exam per Dr. Tyson AliasFeinstein and Dr. Isidoro Donningai.  BP 155/77 HR 146  RR 38.  Respiratory staff to bedside.     Interventions:  Meds for intubation given per order Dr. Tyson AliasFeinstein - Fentanyl 100 mcg, Versed 2 mg and Propofol drip started. See MAR for details.   Resp present for assistance.  Patient intubated per Dr. Tyson AliasFeinstein - some blood in ETT - Heparin discontinued per order MD.  Julieta BelliniBronch done per MD.  BP dropped - NS 1000 cc bolus started - BP responding to fluids - see flowsheet.  Transferred to 2M03 - handoff to Lauren RN.  Dr. Isidoro Donningai notifying family.   Plan of Care (if not transferred):  Event Summary: Name of Physician Notified: Dr. Isidoro Donningai at  (PTA RRT)  Name of Consulting Physician Notified: Dr. Tyson AliasFeinstein at  (by Dr. Isidoro Donningai)  Outcome: Transferred (Comment) ([id number])  Event End Time: [address]1030  Mercy Leppla L

## 2016-08-09 NOTE — Progress Notes (Signed)
Event Note:   At approximately 1000 while being bathed, patient became very agitated and combative.  Patient went into respiratory distress shortly thereafter with tachypnea, "guppy" respiratory pattern, dusky skin color, and wet sounding cough.  NT/subglottic suctioning attempted but as patient was very resistant and on a heparin gtt bleeding was noted. NT suctioning was immediately stopped with minimal effectiveness or improvement in patient's respiratory status.  Oxygen saturation was difficult to monitor at the time as patient was thrashing in bed.  Dr. Isidoro Donningai, RT, and Rapid Response RN called and quickly came to patient's bedside.  Dr. Tyson AliasFeinstein was called to the bedside and the patient was intubated.  See MAR for medications used for intubation.  Bronchoscopy performed by Dr. Tyson AliasFeinstein secondary to significant airway bleeding and difficult ventilation with BMV. Patient transported to 78M-03.  Report called to Leotis ShamesLauren, RN.

## 2016-08-09 NOTE — Progress Notes (Signed)
[facility]                                                                              Patient Demographics  Johnathan [name], is a 46 y.o. male, DOB - [birth year], HFW:[id number]  Admit date - 07/19/2016   Admitting Physician [name], MD  Outpatient Primary MD for the patient is [name], MD  Outpatient specialists:   LOS - 21  days   Medical records reviewed and are as summarized below:    Chief Complaint  Patient presents with  . Cellulitis       Brief summary   HPI On 07/19/2016 by Dr. [name] Johnathan [name] a 46 y.o.male,w Hep C?, IVDA, (recently injected opana) apparently c/o fever intermittently for the past 1 week, and redness over the dorsum of the right foot and slight swelling. Pt notes injecting Opana about 2 days ago. Pt denies any recent dental work. Pt presented to ED due to subjective fevers.  In ED, Pt found to have mild cellulitis of the right >left foot as well as uti wbc 6-30. Pt noted to be in ARF bun/creat 26/3.17, And to have abnormal LFT, Ast 47, Alt 25, Alk phos 206, T. Bili 1.6, Alb 2.4. And hyponatremia Na 123 , CXR right lung base slightly more patchy may reflect aspiration or pneumonia. Possible small right pleural effusion Pt will be admitted for sepsis secondary to uti vs cellulitis and ARF and hyponatremia. (note LP pending due to ? Neck stiffness) Since his admission he has been found to have MSSA bacteremia, possible meningitis, and LE cellulitis. TTE showed EF 02-77%, grade 1 diastolic dysfunction, and tricuspid valve with a mobile vegetation. On 7/17 he developed acute encephalopathy with agitation, becoming belligerent and combative. He was placed on CIWA protocol, however patient was not responsive to ativan and required transfer to the ICU for a precedex gtt. S/p thoracentesis.   Assessment & Plan     Right lung cavitary pneumonia with pleural effusion, Acute respiratory distress,  acute hypoxic respiratory failure - Septic emboli versus aspiration, PCCM and ID consulted. - IR consulted, status post right-sided thoracentesis, 1.5 L of yellow fluid removed - Fluid cultures with no growth, no organisms. Fluid fungal and acid-fast cultures negative so far - Cytology negative - called today by RN ~10am for acute mental status changes/agitated with respiratory distress, likely aspirated. On my examination, very confused, diffuse rhonchi, aspirating, placed on venti mask, difficult to obtain saturation, tachycardiac and tachypneic. Will not be able to tolerate BiPAP hence called critical care  - Dr. Titus Mould urgently saw the patient right away and patient was intubated, transferred to intensive care unit.   MSSA bacteremia/endocarditis of the tricuspid valve, pseudomonas bacteremia - Positive blood cultures for pseudomonas and MSSA, ID following - Continue cefepime - 2-D echo, TEE showed tricuspid valve endocarditis, EF 41-28%, grade 1 diastolic dysfunction. - Given patient's IV drug abuse, patient will need prolonged hospital stay for antibiotics versus SNF. PICC ordered, however if patient chooses to leave AMA and is oriented, PICC line will need to be removed .  Acute encephalopathy - Currently confused and agitated. He had required  Precedex for agitation in ICU last month. - CT head unremarkable x2, ammonia level normal - EEG showed moderate diffuse slowing of the background, can be seen with hypoxic/ischemic injury, metabolic encephalopathy, no epileptiform discharges or seizures seen - MRI of the brain 8/3 showed no acute infarct or areas or restricted diffusion, no septic emboli  Polysubstance abuse - Patient was on Suboxone, now intubated  Acute kidney injury - Likely secondary to sepsis and dehydration. - Renal ultrasound showed mild right pelvic caliectasis with a ureteral jet noted and urinary bladder excluding significant ureteral obstruction. Creatinine on  admission 3.17 - Creatinine worsening, likely due to poor oral intake, on IV Fluids   Accelerated hypertension - Currently stable, continue metoprolol, Nitropaste, clonidine patch and hydralazine as needed  Acute LUE DVT - Continue heparin drip per pharmacy  Moderate malnutrition/hypoalbuminemia -Patient on dysphagia diet  Abnormal thyroid testing - TSH is 0.20, T4 2.5 likely due to euthyroid sick syndrome due to acute critical illness - Follow up outpatient  Normocytic anemia - Currently on heparin drip, H&H stable, - Ferritin 609, acute phase reactant, iron profile will need to be repeated again closer to discharge  Cellulitis of the right lower extremity - Resolved  History of hepatitis C - LFTs normal, patient should follow outpatient PCP and GI referral  Code Status: Full CODE STATUS DVT Prophylaxis: Heparin Family Communication: No family member at the bedside, I called patient's mother on the phone however she did not pick up the phone Disposition Plan: Transfer to ICU. PCCM will assume care. I will sign off.  Time Spent in minutes  35 minutes  Procedures:  Lumbar puncture Renal US Echocardiogram TEE Lower extremity Doppler Left upper extremity Doppler US Guided Right thoracentesis  PICC line placement   Consultants:   Infectious disease PCCM Cardiology Interventional radiology  Antimicrobials:   IV cefepime, 7/ 16>> 8/26  Medications  Scheduled Meds: . fentaNYL      . midazolam      . buprenorphine-naloxone  1 tablet Sublingual Daily  . Chlorhexidine Gluconate Cloth  6 each Topical Daily  . cloNIDine  0.3 mg Transdermal Weekly  . feeding supplement  1 Container Oral TID BM  . folic acid  1 mg Oral Daily  . LORazepam  1 mg Intravenous Once  . metoprolol tartrate  5 mg Intravenous Q6H  . nitroGLYCERIN  1 inch Topical Q6H  . sodium chloride flush  10-40 mL Intracatheter Q12H  . sodium chloride flush  3 mL Intravenous Q12H   Continuous  Infusions: . propofol    . sodium chloride Stopped (08/09/16 0028)  . ceFEPime (MAXIPIME) IV Stopped (08/09/16 0348)  . dextrose 5 % and 0.9% NaCl 75 mL/hr at 08/09/16 0030  . heparin 1,500 Units/hr (08/09/16 0439)   PRN Meds:.acetaminophen **OR** acetaminophen, albuterol, dextrose, fentaNYL (SUBLIMAZE) injection, haloperidol lactate, hydrALAZINE, LORazepam, ondansetron (ZOFRAN) IV, sodium chloride flush   Antibiotics   Anti-infectives    Start     Dose/Rate Route Frequency Ordered Stop   07/22/16 1400  ceFEPIme (MAXIPIME) 2 g in dextrose 5 % 50 mL IVPB     2 g 100 mL/hr over 30 Minutes Intravenous Every 8 hours 07/22/16 1122 09/01/16 0259   07/20/16 1500  ceFEPIme (MAXIPIME) 2 g in dextrose 5 % 50 mL IVPB  Status:  Discontinued     2 g 100 mL/hr over 30 Minutes Intravenous Every 12 hours 07/20/16 1410 07/22/16 1122   07/19/16 2300  vancomycin (VANCOCIN) IVPB 750 mg/150 ml premix  Status:  Discontinued     750 mg 150 mL/hr over 60 Minutes Intravenous Every 24 hours 07/19/16 0237 07/19/16 0953   07/19/16 2200  ceFEPIme (MAXIPIME) 2 g in dextrose 5 % 50 mL IVPB  Status:  Discontinued     2 g 100 mL/hr over 30 Minutes Intravenous Every 24 hours 07/19/16 1931 07/20/16 1410   07/19/16 2000  nafcillin 2 g in dextrose 5 % 100 mL IVPB  Status:  Discontinued     2 g 200 mL/hr over 30 Minutes Intravenous Every 4 hours 07/19/16 1915 07/22/16 1130   07/19/16 1915  nafcillin injection 2 g  Status:  Discontinued     2 g Intravenous Every 4 hours 07/19/16 1912 07/19/16 1915   07/19/16 1800  ceFEPIme (MAXIPIME) 2 g in dextrose 5 % 50 mL IVPB  Status:  Discontinued     2 g 100 mL/hr over 30 Minutes Intravenous Every 24 hours 07/19/16 1646 07/19/16 1912   07/19/16 1100  vancomycin (VANCOCIN) 500 mg in sodium chloride 0.9 % 100 mL IVPB  Status:  Discontinued     500 mg 100 mL/hr over 60 Minutes Intravenous Every 12 hours 07/19/16 0953 07/19/16 1645   07/19/16 0800  piperacillin-tazobactam (ZOSYN)  IVPB 3.375 g  Status:  Discontinued     3.375 g 12.5 mL/hr over 240 Minutes Intravenous Every 8 hours 07/19/16 0239 07/19/16 1645   07/19/16 0200  cefTRIAXone (ROCEPHIN) 2 g in dextrose 5 % 50 mL IVPB     2 g 100 mL/hr over 30 Minutes Intravenous  Once 07/19/16 0140 07/19/16 0259   07/19/16 0130  cefTRIAXone (ROCEPHIN) 1 g in dextrose 5 % 50 mL IVPB  Status:  Discontinued     1 g 100 mL/hr over 30 Minutes Intravenous  Once 07/19/16 0125 07/19/16 0140   07/19/16 0115  piperacillin-tazobactam (ZOSYN) IVPB 3.375 g     3.375 g 100 mL/hr over 30 Minutes Intravenous  Once 07/19/16 0109 07/19/16 0219   07/19/16 0115  vancomycin (VANCOCIN) IVPB 1000 mg/200 mL premix     1,000 mg 200 mL/hr over 60 Minutes Intravenous  Once 07/19/16 0109 07/19/16 0243        Subjective:   Johnathan [name] was seen and examined today. Agitated, having respiratory distress, diffuse rhonchi . No fevers or chills, possibly aspirated  Objective:   Vitals:   08/09/16 0245 08/09/16 0321 08/09/16 0345 08/09/16 0749  BP: 122/88  136/86 (!) 151/87  Pulse: (!) 59  85 81  Resp: 13  (!) 23 16  Temp: 98.3 F (36.8 C)  98.3 F (36.8 C) (!) 97.3 F (36.3 C)  TempSrc: Oral  Oral Axillary  SpO2: 97%  96% 96%  Weight:  81.6 kg (180 lb)    Height:        Intake/Output Summary (Last 24 hours) at 08/09/16 1002 Last data filed at 08/09/16 0600  Gross per 24 hour  Intake          2204.49 ml  Output              150 ml  Net          2054.49 ml     Wt Readings from Last 3 Encounters:  08/09/16 81.6 kg (180 lb)  09/12/14 75.3 kg (166 lb)     Exam   General: agitated, on venti mask    Eyes: PERRLA, EOMI, Anicteric Sclera,  HEENT:  Atraumatic, normocephalic, normal oropharynx  Cardiovascular: S1 S2 auscultated, tachycardiac No pedal  edema b/l  Respiratory: Diffuse rhonchi bilaterally  Gastrointestinal: Soft, nontender, nondistended, + bowel sounds  Ext: no pedal edema bilaterally  Neuro:  agitated, not following any commands   Musculoskeletal: No digital cyanosis, clubbing  Skin: No rashes  Psych: agitated    Data Reviewed:  I have personally reviewed following labs and imaging studies  Micro Results Recent Results (from the past 240 hour(s))  Gram stain     Status: None   Collection Time: 08/01/16  3:09 PM  Result Value Ref Range Status   Specimen Description FLUID RIGHT PLEURAL  Final   Special Requests NONE  Final   Gram Stain   Final    ABUNDANT WBC PRESENT,BOTH PMN AND MONONUCLEAR NO ORGANISMS SEEN    Report Status 08/01/2016 FINAL  Final  Acid Fast Smear (AFB)     Status: None   Collection Time: 08/01/16  3:09 PM  Result Value Ref Range Status   AFB Specimen Processing Concentration  Final   Acid Fast Smear Negative  Final    Comment: (NOTE) Performed At: [facility] [address] [place], [place] [id number] Lindon Romp MD SU:[id number]    Source (AFB) FLUID  Final    Comment: RIGHT PLEURAL   Fungus Culture With Stain     Status: None (Preliminary result)   Collection Time: 08/01/16  3:09 PM  Result Value Ref Range Status   Fungus Stain Final report  Final    Comment: (NOTE) Performed At: [facility] [place], [place] [id number] Lindon Romp MD MM:[id number]    Fungus (Mycology) Culture PENDING  Incomplete   Fungal Source FLUID  Final    Comment: RIGHT PLEURAL   Culture, body fluid-bottle     Status: None   Collection Time: 08/01/16  3:09 PM  Result Value Ref Range Status   Specimen Description FLUID RIGHT PLEURAL  Final   Special Requests BOTTLES DRAWN AEROBIC AND ANAEROBIC 10CC  Final   Culture NO GROWTH 5 DAYS  Final   Report Status 08/06/2016 FINAL  Final  Fungus Culture Result     Status: None   Collection Time: 08/01/16  3:09 PM  Result Value Ref Range Status   Result 1 Comment  Final    Comment: (NOTE) KOH/Calcofluor preparation:  no fungus observed. Performed At: [facility] [place], [place] [id number] Lindon Romp MD AN:[id number]     Radiology Reports Dg Chest 1 View  Result Date: 08/01/2016 CLINICAL DATA:  RIGHT pleural effusion EXAM: CHEST 1 VIEW COMPARISON:  CT chest 07/30/2016 FINDINGS: Normal heart size, mediastinal contours and pulmonary vascularity. Emphysematous changes with minimal central peribronchial thickening. Small RIGHT pleural effusion question extending into minor fissure. Atelectasis versus infiltrate retrocardiac LEFT lower lobe. Remaining lungs clear. Two areas of nodularity in LEFT lung correspond to the nodular foci on recent CT. No pneumothorax. IMPRESSION: COPD changes with small RIGHT pleural effusion likely extending into minor fissure. Nodular areas LEFT lung better assessed on recent CT chest. Emphysema (ICD10-J43.9). Electronically Signed   By: Lavonia Dana M.D.   On: 08/01/2016 15:45   Dg Ankle 2 Views Left  Result Date: 07/29/2016 CLINICAL DATA:  Blunt trauma while transferring patient to bed EXAM: LEFT ANKLE - 2 VIEW COMPARISON:  None. FINDINGS: Mild soft tissue swelling is noted laterally. No acute fracture or dislocation is seen. No other focal abnormality is noted. IMPRESSION: Mild soft tissue swelling without acute bony abnormality. Electronically Signed   By: Inez Catalina  M.D.   On: 07/29/2016 19:47   Ct Head Wo Contrast  Result Date: 08/01/2016 CLINICAL DATA:  Altered mental status EXAM: CT HEAD WITHOUT CONTRAST TECHNIQUE: Contiguous axial images were obtained from the base of the skull through the vertex without intravenous contrast. COMPARISON:  Head CT 07/30/2016 FINDINGS: Brain: No mass lesion, intraparenchymal hemorrhage or extra-axial collection. No evidence of acute cortical infarct. Brain parenchyma and CSF-containing spaces are normal for age. Vascular: No hyperdense vessel or unexpected calcification. Skull: Normal visualized skull base, calvarium and extracranial soft tissues.  Sinuses/Orbits: No sinus fluid levels or advanced mucosal thickening. No mastoid effusion. Normal orbits. IMPRESSION: Normal head CT. Electronically Signed   By: Ulyses Jarred M.D.   On: 08/01/2016 14:04   Ct Head Wo Contrast  Result Date: 07/30/2016 CLINICAL DATA:  Mental status change.  Pulmonary embolism. EXAM: CT HEAD WITHOUT CONTRAST TECHNIQUE: Contiguous axial images were obtained from the base of the skull through the vertex without intravenous contrast. COMPARISON:  CT head without contrast 07/22/2016 FINDINGS: Brain: Mild generalized atrophy is again noted no acute infarct, hemorrhage, or mass lesion is present. There is no significant white matter disease. The ventricles are of normal size. No significant extraaxial fluid collection is present. The Vascular: No hyperdense vessel or unexpected calcification. Skull: The calvarium is intact. No focal lytic or blastic lesions are present. Sinuses/Orbits: The paranasal sinuses and mastoid air cells are clear. The globes and orbits are within normal limits bilaterally. IMPRESSION: 1. Stable mild generalized atrophy. 2. No acute intracranial abnormality. Electronically Signed   By: San Morelle M.D.   On: 07/30/2016 16:40   Ct Head W & Wo Contrast  Result Date: 07/22/2016 CLINICAL DATA:  Evaluate for septic emboli. IV drug abuser, with intermittent fever for the past 1 week. BILATERAL foot cellulitis. Hyponatremia. EXAM: CT HEAD WITHOUT AND WITH CONTRAST TECHNIQUE: Contiguous axial images were obtained from the base of the skull through the vertex without and with intravenous contrast CONTRAST:  2m ISOVUE-300 IOPAMIDOL (ISOVUE-300) INJECTION 61% COMPARISON:  CT head 08/23/2009. FINDINGS: Brain: No evidence of acute infarction, hemorrhage, hydrocephalus, extra-axial collection or mass lesion/mass effect. Premature for age cerebral and cerebellar atrophy. Post infusion, no abnormal enhancement of the brain or visible meninges. No vasogenic edema.  Vascular: No hyperdense vessel or unexpected calcification. Visible vessels, including major dural venous sinuses, are patent. Skull: Normal. Negative for fracture or focal lesion. Sinuses/Orbits: No acute finding. Other: None. Compared with priors, there has been progressive loss of brain substance. IMPRESSION: Progressive atrophy since 2011.  No acute intracranial findings. No abnormal enhancement or vasogenic edema to suggest septic emboli to the brain. If no contraindications, MRI without and with contrast is more sensitive in the detection of intracranial infection. Electronically Signed   By: JStaci RighterM.D.   On: 07/22/2016 15:55   Ct Angio Chest Pe W Or Wo Contrast  Result Date: 07/30/2016 CLINICAL DATA:  46year old with mental status change. Bacteremia. Preliminary results of a left upper extremity venous duplex demonstrated small thrombus in one of the radial veins. EXAM: CT ANGIOGRAPHY CHEST WITH CONTRAST TECHNIQUE: Multidetector CT imaging of the chest was performed using the standard protocol during bolus administration of intravenous contrast. Multiplanar CT image reconstructions and MIPs were obtained to evaluate the vascular anatomy. CONTRAST:  100 mL Isovue 370 COMPARISON:  Chest radiograph 07/28/2016 and chest CT 11/06/2003 FINDINGS: Cardiovascular: Negative for pulmonary embolism. Pulmonary arteries are well opacified on this examination. 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 Aorta measured 3.7 cm in 2005. Limited evaluation for dissection on this examination but no suspicious abnormalities in thoracic aorta other than the aneurysm. Heart size is within normal limits. No pericardial fluid. Mediastinum/Nodes: No significant chest lymphadenopathy but limited evaluation due to the extensive pleuroparenchymal lung disease. Lungs/Pleura: Large right pleural effusion and moderate-sized left pleural effusion. Severe emphysematous disease in the lungs. There is a  combination of centrilobular and paraseptal emphysema. Near complete collapse and consolidation of the right lower lobe related to the right pleural effusion. There is a prominent 1.1 cm nodule along the right minor fissure on sequence 6, image 97. There is focal consolidation along the posterior aspect of the right minor fissure which may be involving both the right upper lobe and the right middle lobe. This area roughly measures up to 3.4 cm on the soft tissue windows on sequence 5, image 90. This could represent a cavitary lesion with adjacent pleural thickening. Volume loss in the posterior right upper lobe related to the large pleural effusion. There may be necrotic lung tissue in the posterior right upper lung on sequence 6, image 53. Large bleb at the left lung apex. Poorly defined hazy nodular structure in the left upper lobe with a branching configuration on sequence 6, image 57. Pleural-based nodular lesion in the lingula measuring up to 2.3 cm on sequence 6, image 18. Volume loss in the left lower lobe associated with the pleural fluid. Upper Abdomen: Upper abdominal ascites, particularly around the liver. Liver has a nodular contour and compatible with cirrhosis. Spleen is enlarged. Limited evaluation of the portal venous system on this examination. Musculoskeletal: No acute bone abnormality. Review of the MIP images confirms the above findings. IMPRESSION: Negative for a pulmonary embolism. Extensive pleural-parenchymal lung disease bilaterally. Large right pleural effusion and moderate-sized left pleural effusion. Pleural-based nodular opacities could represent an infectious etiology but also raise concern for a neoplastic process. Concern for a cavitary lesion and necrotic tissue in the right lung. Consider sampling of the pleural fluid. Recommend follow-up CT when the pleural fluid and acute process have resolved to exclude neoplastic disease. Severe emphysematous disease. Cirrhosis with evidence for  portal hypertension based on the splenomegaly and ascites. 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 Recommend annual imaging followup by CTA or MRA. This recommendation follows 2010 ACCF/AHA/AATS/ACR/ASA/SCA/SCAI/SIR/STS/SVM Guidelines for the Diagnosis and Management of Patients with Thoracic Aortic Disease. Circulation. 2010; 121: F621-H086 Electronically Signed   By: Markus Daft M.D.   On: 07/30/2016 17:05   Mr Brain Wo Contrast  Result Date: 08/08/2016 CLINICAL DATA:  Acute encephalopathy.  Headache EXAM: MRI HEAD WITHOUT CONTRAST TECHNIQUE: Multiplanar, multiecho pulse sequences of the brain and surrounding structures were obtained without intravenous contrast. COMPARISON:  CT head 08/01/2016 FINDINGS: Incomplete study. The patient was not able to cooperate and complete the study. Axial and coronal diffusion-weighted imaging only was obtained and these are degraded by motion. No areas of restricted diffusion on diffusion-weighted imaging. Negative for hydrocephalus or fluid collection. Negative for mass lesion. IMPRESSION: Exam is limited to diffusion only. Negative for acute infarct. No area of restricted diffusion. Electronically Signed   By: Franchot Gallo M.D.   On: 08/08/2016 16:43   US Renal  Result Date: 07/19/2016 CLINICAL DATA:  Acute renal failure EXAM: RENAL / URINARY TRACT ULTRASOUND COMPLETE COMPARISON:  CT 08/12/2010 FINDINGS: Right Kidney: Length: 12.2 cm. Parenchyma isoechoic to adjacent liver. No focal lesion. Mild pelvicaliectasis. Left Kidney: Length: 12.9 cm. 1.1  x 0.4 cm cortical cyst, midpole. No solid mass or hydronephrosis. Bladder: Distended.  Right ureteral jet documented. There is a small amount of abdominal ascites identified. Bilateral pleural effusions noted. Accessory splenule. IMPRESSION: 1. Mild right pelvicaliectasis, with ureteral jet noted in the urinary bladder excluding significant ureteral obstruction. 2. Small left renal cyst. 3. Pleural  effusions and abdominal ascites. Electronically Signed   By: Lucrezia Europe M.D.   On: 07/19/2016 13:17   Dg Chest Port 1 View  Result Date: 07/28/2016 CLINICAL DATA:  Pleural effusion. Pseudomonas bacteremia with acute tricuspid valve endocarditis. EXAM: PORTABLE CHEST 1 VIEW COMPARISON:  07/27/2016 and 07/25/2016 FINDINGS: Patient slightly rotated to the right. Lungs are adequately inflated demonstrate bilateral perihilar bibasilar opacification with slight interval worsening within the left base. Stable small to moderate right pleural effusion. Stable mild cardiomegaly. IMPRESSION: Persistent bilateral perihilar bibasilar opacification with slight interval worsening in the left base. Findings may be due to edema versus infection. Stable small to moderate right pleural effusion. Cardiomegaly. Electronically Signed   By: Marin Olp M.D.   On: 07/28/2016 07:33   Dg Chest Port 1 View  Result Date: 07/27/2016 CLINICAL DATA:  Acute respiratory failure EXAM: PORTABLE CHEST 1 VIEW COMPARISON:  07/25/2016 FINDINGS: Haziness of the right chest from pleural effusion. Interstitial coarsening above prior baseline. There are underlying COPD changes. Nodular density on the left was not seen 06/05/2016 and is likely acute rather than chronic nodule. Asymmetric right apical pleural thickening, chronic. Chronic borderline cardiomegaly. No pneumothorax. IMPRESSION: 1. Layering right pleural effusion obscuring the right lower lung. 2. Probable pulmonary edema. 3. Emphysema. Electronically Signed   By: Monte Fantasia M.D.   On: 07/27/2016 07:14   Dg Chest Port 1 View  Result Date: 07/25/2016 CLINICAL DATA:  Acute respiratory failure EXAM: PORTABLE CHEST 1 VIEW COMPARISON:  Yesterday FINDINGS: Opacity with central lucency in the peripheral right chest. There is a small to moderate right pleural effusion. No pneumothorax. Emphysema. Cardiomegaly.  Stable aortic contours. IMPRESSION: 1. Stable from yesterday. 2. Probable  pneumonia with concern for cavitation on the right. 3. Small to moderate right pleural effusion. 4.  Emphysema (ICD10-J43.9). Electronically Signed   By: Monte Fantasia M.D.   On: 07/25/2016 07:46   Dg Chest Port 1 View  Result Date: 07/24/2016 CLINICAL DATA:  Shortness of breath . EXAM: PORTABLE CHEST 1 VIEW COMPARISON:  07/23/2016 .  07/19/2016.  11/06/2003. FINDINGS: Cardiomegaly. Normal pulmonary vascularity. Persistent right lower lobe consolidation consistent with pneumonia. Cavitation may be present on today's exam. Mild left base atelectasis. Density noted over the left mid lung may represent atelectasis or small amount of fluid in the fissure. Left costophrenic angle incompletely imaged. No pneumothorax . No acute bony abnormality. IMPRESSION: 1. Persistent right lower lobe consolidation consistent with pneumonia. Cavitation may be present. Contrast-enhanced chest CT may prove useful for further evaluation. Persistent right-sided pleural effusion. 2. Left base subsegmental atelectasis. Density noted over the left mid lung field on today's exam may represent atelectasis. This could also represent fissural fluid. Electronically Signed   By: Marcello Moores  Register   On: 07/24/2016 07:04   Dg Chest Port 1 View  Result Date: 07/23/2016 CLINICAL DATA:  Shortness of breath and chest pain EXAM: PORTABLE CHEST 1 VIEW COMPARISON:  July 19, 2016 FINDINGS: There is loculated effusion in the right base with patchy airspace consolidation in the right lower lobe. There is mild scarring in the upper lobes bilaterally. There is patchy atelectasis in the lower lobes. No pneumothorax.  Heart size and pulmonary vascularity are normal. No evident bone lesions. IMPRESSION: Small right pleural effusion with consolidation in the right lower lobe, likely pneumonia. A areas of scarring and atelectasis noted as well. Cardiac silhouette stable. No evident pneumothorax. Electronically Signed   By: Lowella Grip III M.D.   On:  07/23/2016 09:51   Dg Chest Port 1 View  Result Date: 07/19/2016 CLINICAL DATA:  Cellulitis. Worsening swelling and redness of both feet. Intermittent fever. Code sepsis. EXAM: PORTABLE CHEST 1 VIEW COMPARISON:  Radiograph 06/05/2016 FINDINGS: Emphysema. Lung volumes from prior exam. Normal heart size and mediastinal contours for technique. There are streaky opacities in both lower lobes, with slightly more patchy right lung base opacity. Small right pleural effusion. No pneumothorax. No acute osseous abnormality. IMPRESSION: Emphysema. Streaky lower lobe opacities favoring atelectasis. Right lung base slightly more patchy which may reflect aspiration or pneumonia. Possible small right pleural effusion. Electronically Signed   By: Jeb Levering M.D.   On: 07/19/2016 01:53   Ir Thoracentesis Asp Pleural Space W/img Guide  Result Date: 08/01/2016 INDICATION: Shortness of breath. Large right pleural effusion. Request diagnostic and therapeutic thoracentesis. EXAM: ULTRASOUND GUIDED RIGHT THORACENTESIS MEDICATIONS: None. COMPLICATIONS: None immediate. Postprocedural chest x-ray negative for pneumothorax. PROCEDURE: An ultrasound guided thoracentesis was thoroughly discussed with the patient and questions answered. The benefits, risks, alternatives and complications were also discussed. The patient understands and wishes to proceed with the procedure. Written consent was obtained. Ultrasound was performed to localize and mark an adequate pocket of fluid in the right chest. The area was then prepped and draped in the normal sterile fashion. 1% Lidocaine was used for local anesthesia. Under ultrasound guidance a Safe-T-Centesis catheter was introduced. Thoracentesis was performed. The catheter was removed and a dressing applied. FINDINGS: A total of approximately 1.5 L of hazy, yellow fluid was removed. Samples were sent to the laboratory as requested by the clinical team. IMPRESSION: Successful ultrasound  guided right thoracentesis yielding 1.5 L of pleural fluid. Read by: Ascencion Dike PA-C Electronically Signed   By: Sandi Mariscal M.D.   On: 08/01/2016 15:34    Lab Data:  CBC:  Recent Labs Lab 08/05/16 0450 08/06/16 0543 08/07/16 0521 08/08/16 0431 08/09/16 0322  WBC 7.0 7.6 6.4 5.6 7.1  HGB 8.5* 8.6* 8.8* 8.2* 8.1*  HCT 26.4* 26.6* 27.2* 25.2* 24.9*  MCV 86.8 88.7 87.5 87.8 88.3  PLT 203 176 150 134* 235*   Basic Metabolic Panel:  Recent Labs Lab 08/05/16 0450 08/06/16 0543 08/07/16 0521 08/08/16 0431 08/09/16 0322  NA 142 140 140 142 142  K 3.7 3.8 3.8 3.4* 3.9  CL 122* 120* 119* 125* 124*  CO2 17* 17* 15* 14* 15*  GLUCOSE 109* 114* 90 124* 76  BUN 42* 42* 42* 49* 54*  CREATININE 1.23 1.36* 1.46* 1.79* 2.38*  CALCIUM 8.9 9.0 8.9 8.8* 8.8*   GFR: Estimated Creatinine Clearance: 41.7 mL/min (A) (by C-G formula based on SCr of 2.38 mg/dL (H)). Liver Function Tests: No results for input(s): AST, ALT, ALKPHOS, BILITOT, PROT, ALBUMIN in the last 168 hours. No results for input(s): LIPASE, AMYLASE in the last 168 hours.  Recent Labs Lab 08/02/16 1131  AMMONIA 28   Coagulation Profile: No results for input(s): INR, PROTIME in the last 168 hours. Cardiac Enzymes: No results for input(s): CKTOTAL, CKMB, CKMBINDEX, TROPONINI in the last 168 hours. BNP (last 3 results) No results for input(s): PROBNP in the last 8760 hours. HbA1C: No results for input(s): HGBA1C in  the last 72 hours. CBG:  Recent Labs Lab 08/09/16 0028 08/09/16 0125 08/09/16 0244 08/09/16 0345 08/09/16 0747  GLUCAP 54* 90 76 81 71   Lipid Profile: No results for input(s): CHOL, HDL, LDLCALC, TRIG, CHOLHDL, LDLDIRECT in the last 72 hours. Thyroid Function Tests: No results for input(s): TSH, T4TOTAL, FREET4, T3FREE, THYROIDAB in the last 72 hours. Anemia Panel: No results for input(s): VITAMINB12, FOLATE, FERRITIN, TIBC, IRON, RETICCTPCT in the last 72 hours. Urine analysis:      Component Value Date/Time   COLORURINE AMBER (A) 07/28/2016 1622   APPEARANCEUR TURBID (A) 07/28/2016 1622   LABSPEC 1.034 (H) 07/28/2016 1622   PHURINE 5.0 07/28/2016 1622   GLUCOSEU 50 (A) 07/28/2016 1622   HGBUR LARGE (A) 07/28/2016 1622   BILIRUBINUR SMALL (A) 07/28/2016 1622   KETONESUR 5 (A) 07/28/2016 1622   PROTEINUR >=300 (A) 07/28/2016 1622   NITRITE NEGATIVE 07/28/2016 1622   LEUKOCYTESUR NEGATIVE 07/28/2016 1622     Yutaka Holberg M.D. [facility] 08/09/2016, 10:02 AM  Pager: [phone] Between 7am to 7pm - call Pager - [phone]  After 7pm go to [email or web address] - password [email or web address]  Call night coverage person covering after 7pm

## 2016-08-09 NOTE — Progress Notes (Signed)
Patient transported from 3M12 to room 2M03 without complications.

## 2016-08-09 NOTE — Progress Notes (Addendum)
[facility]UL[facility] / CRITICAL CARE MEDICINE CONSULT   Name: Johnathan [name] MRN: [id number] DOB: 1970-10-27    ADMISSION DATE:  07/19/2016 CONSULTATION DATE:  07/23/2016  REFERRING MD:  Ree Kida  CHIEF COMPLAINT:  Need for Precedex to manage withdrawal from ETOH/ IVD,  HISTORY OF PRESENT ILLNESS:   [name] a 46 y.o.male,w Hep C?, IVDA, (recently injected opana) apparently c/o fever intermittently for  1 week, and redness over the dorsum of the right foot and slight swelling. Pt notes injecting Opana about 2 days ago and cellulitis in right foot noted on admission 7/13 with labs showing EKG and abnormal LFTs with hyponatremia 123 and right lower lobe infiltrate on chest x-ray  Found to have MSSA bacteremia, possible meningitis, and LE cellulitis.  Echocardiogram Showed EF 47-09%, grade 1 diastolic dysfunction, tricuspid valve with mobile vegetation. On 7/17 He developed acute encephalopathy with agitation, becoming belligerent and combative. He denies ETOH abuse, but admitted to IVDA. He was placed on CIWA protocol, however, scores remained 19-23, patient was not responsive  to ativan, transferred to ICU for precedex gtt    SUBJECTIVE:  Tubed emergently, DAH Heparin off  VITAL SIGNS: BP (!) 151/87 (BP Location: Left Leg)   Pulse 81   Temp 98.7 F (37.1 C) (Oral)   Resp 16   Ht _0  (1.803 m)   Wt 180 lb (81.6 kg)   SpO2 96%   BMI 25.10 kg/m   HEMODYNAMICS:    VENTILATOR SETTINGS: Vent Mode: PRVC FiO2 (%):  [100 %] 100 % Set Rate:  [20 bmp] 20 bmp Vt Set:  [600 mL] 600 mL PEEP:  [5 cmH20] 5 cmH20 Plateau Pressure:  [27 cmH20] 27 cmH20  INTAKE / OUTPUT: I/O last 3 completed shifts: In: 3370.6 [I.V.:3120.6; IV Piggyback:250] Out: 250 [Urine:250]  PHYSICAL EXAMINATION: General appearance:  Chronically ill appearing 46 Year old  Male, sedated on vent Eyes: anicteric sclerae, moist conjunctivae; PERRL, EOMI bilaterally. Mouth:  membranes and no mucosal  ulcerations; orally intubated Neck: Trachea midline; neck supple, no JVD Lungs/chest: crackles both bases. Red bloody trach secretions,  with normal respiratory effort and no intercostal retractions CV: RRR, + systolic murmur  Abdomen: Soft, non-tender; no masses or HSM Extremities: No peripheral edema or extremity lymphadenopathy Skin: Normal temperature, no edema Psych: sedated on vent    LABS:  BMET  Recent Labs Lab 08/07/16 0521 08/08/16 0431 08/09/16 0322  NA 140 142 142  K 3.8 3.4* 3.9  CL 119* 125* 124*  CO2 15* 14* 15*  BUN 42* 49* 54*  CREATININE 1.46* 1.79* 2.38*  GLUCOSE 90 124* 76    Electrolytes  Recent Labs Lab 08/07/16 0521 08/08/16 0431 08/09/16 0322  CALCIUM 8.9 8.8* 8.8*    CBC  Recent Labs Lab 08/07/16 0521 08/08/16 0431 08/09/16 0322  WBC 6.4 5.6 7.1  HGB 8.8* 8.2* 8.1*  HCT 27.2* 25.2* 24.9*  PLT 150 134* 146*    Coag's No results for input(s): APTT, INR in the last 168 hours.  Sepsis Markers No results for input(s): LATICACIDVEN, PROCALCITON, O2SATVEN in the last 168 hours.  ABG  Recent Labs Lab 08/02/16 1120  PHART 7.282*  PCO2ART 30.5*  PO2ART 77.8*    Liver Enzymes No results for input(s): AST, ALT, ALKPHOS, BILITOT, ALBUMIN in the last 168 hours.  Cardiac Enzymes No results for input(s): TROPONINI, PROBNP in the last 168 hours.  Glucose  Recent Labs Lab 08/09/16 0028 08/09/16 0125 08/09/16 0244 08/09/16 0345 08/09/16 0747 08/09/16 1036  GLUCAP 54* 90  76 81 71 84    Imaging Mr Brain Wo Contrast  Result Date: 08/08/2016 CLINICAL DATA:  Acute encephalopathy.  Headache EXAM: MRI HEAD WITHOUT CONTRAST TECHNIQUE: Multiplanar, multiecho pulse sequences of the brain and surrounding structures were obtained without intravenous contrast. COMPARISON:  CT head 08/01/2016 FINDINGS: Incomplete study. The patient was not able to cooperate and complete the study. Axial and coronal diffusion-weighted imaging only was  obtained and these are degraded by motion. No areas of restricted diffusion on diffusion-weighted imaging. Negative for hydrocephalus or fluid collection. Negative for mass lesion. IMPRESSION: Exam is limited to diffusion only. Negative for acute infarct. No area of restricted diffusion. Electronically Signed   By: Franchot Gallo M.D.   On: 08/08/2016 16:43     STUDIES:  LP 7/13 >> High WBC and protein>> suspect meningitis but Cx no growth Echo > EF 35-32%, grade 1 diastolic dysfunction, tricuspid valve with mobile vegetation>> Needs TEE but refusing CT Head 7/16>> Progressive atrophy, negative for edema to suggest septic emboli Renal ultrasound 7/13>> showed mild right pelvicaliectasis with ureteral jet noted and urinary bladder excluding significant ureteral obstruction EEG 8/2: diffuse mod/slowing  CULTURES: CSF 7/13 >> No growth Blood cultures >> 07/18/16 MSSA  (2/2), Pseudomonas aeruginosa (however gram stain showed no GN) RVP 7/13>> Unremarkable Repeat Blood cultures >>  07/20/16: 1/2 MSSA bcx2 8/3>>> Bal 8/3>>>  ANTIBIOTICS: Vanc 7/13>> 7/13 Zosyn 7/13>> 7/13 Nafcillin 7/13>> 7/16 Cefepime 7/16>>  vanc 8/3>>>  SIGNIFICANT EVENTS: 7/17>> Transfer to ICU for precedex infusion 7/21 Precedex off.    LINES/TUBES: PIV  DISCUSSION: Pt. With MSSA + pseudomonas bacteremia and acute Endocarditis w/ Tricuspid vegetation, further c/b AKI, w/d, cellulitis RLE, cavitary PNA on right from septic emboli w/ right parapneumonic pleural effusion also LUE DVT.    PCCM had singed off on 7/25.   From 7/25 to 8/3:  Weaned off precedex for wd. abx narrowed to cover w/ cefepime thru 8/25 On 8/2 became hypothermic, more agitated 8/3 developed acute tachypnea w/ "guppy breathing". Emergently intubated. FOB at bedside w/ mild diffuse alveolar hemorrhage.  Transferred to our service in ICU   ASSESSMENT / PLAN:  PULMONARY A: Acute Hypoxic Respiratory Failure in setting of DAH vs flash  edema Cavitary PNA in setting of septic Pulmonary emboli c/b parapneumonic effusion  Not entirely clear why he developed acute alveolar hemorrhage. ? Did his TV rupture? Does he have new infection?  P: F/u CXR & ABG Repeat ECHO Consider repeat CT chest  Send sputum culture  See ID section   CARDIOVASCULAR A:  Sepsis secondary to MSSA bacteremia- TV endocarditis EF>> 99-24% grade 1 diastolic dysfunction, tricuspid valve with mobile vegetation Now w/ concern that he may have ruptured his TV LUE DVT P: Holding AC.  Tele monitoring Repeat ECHO Cont IVFs Ck CEs, 12 lead Dc clonidine patch dc NTG patch  RENAL A:   Recurrent AKI NAGMA d/t hyperchloremia, also appears to be element of AGMA (suspect lactic acidosis d/t respiratory failure) P:   Change IVF to D5LR; dc NS Renal dose meds Avoid hypotension; goal MAP > 65 Strict I&O Renal dose meds.   GASTROINTESTINAL A:   Protein calorie malnutrition-mild P:   Start tubefeeds PPI for SUP  HEMATOLOGIC A:   Anemia of critical illness  Mild thrombocytopenia  LUE DVT P:  SCDs to BLE Holding AC d/t alveolar hemorrhage  Serial CBC Transfuse per protocol   INFECTIOUS A:   MSSA bacteremia and pseudomonas endocarditis Cavitary PNA in setting of septic emboli Right  LE cellulitis (resolved) H/o HCV  Possible new sepsis 8/3 P:   Cont cefepime thru 8/25 Repeat BC, and BAL Add IV vanc ID following VL of HCV  ENDOCRINE A:   Hypoglycemia  P:   Serial CBGs Add dextrose to IVFs Ck cortisol   NEUROLOGIC A:   Acute encephalopathy.  Initially felt that this was w/d. Now out of time frame for this yet remains encephalopathic. MRI was negative for abscess. EEG showing diffuse slowing. Suspect this is primarily metabolic in nature.  P:   PAD protocol RAS goal -1 to -2 Supportive care  Stopping suboxone (can re-consider this if extubated)  FAMILY  - Updates: No family at bedside 7/19  - Inter-disciplinary family meet  or Palliative Care meeting due by:  7/25 .   My cct 43 min Erick Colace ACNP-BC [place] Pager # [phone] OR # [phone] if no answer  08/09/2016 10:41 AM  STAFF NOTE: I, Merrie Roof, MD FACP have personally reviewed patient's available data, including medical history, events of note, physical examination and test results as part of my evaluation. I have discussed with resident/NP and other care providers such as pharmacist, RN and RRT. In addition, I personally evaluated patient and elicited key findings of: severe distress, not following commands, confused, severe ronchi, severe distress, rr 60, abdo soft, tatoo clean, no edema lowers, picc in place, pcxr last I reviewed showed likely septic emboli on left and prior effusion atx, CT last I also reviewed showed effusion with compressive atx which has been drained in past, he presents acutely now with resp failure with extensive DAH frothy to some extent but intially bright red blood, emergent intubation and bronch done, etiology not clear, would rule out bronch trauma from suction on heparin vs new left side endocarditis with valvular involvement giving rise to pulm edema vs HCAP, pcxr stat, required paralysis for dyschrony, will use prop fent may need continued paralysis, assess vent needs and synchrony, needs echo stat assessment, maintain current abx , add vanc until we rule out, assess new BC, BAl sent, heparin dc stat, if bleeding increased then protamine, but the risk of reaction from protamine outwt benefit that I saw on bronch, get ABG stat, maintain plat less 30 , consider early TF, also with ATN likely ARF, allow pos balance, obtain cvp on picc, if [facility] pos dc picc, will need repeat CT likely, pcxr stat first to assess, will re assess heparin safety in am, would hope can restart on Saturday, no filter for now, no family at bedside, [facility] doc to call mom The patient is critically ill with multiple organ systems  failure and requires high complexity decision making for assessment and support, frequent evaluation and titration of therapies, application of advanced monitoring technologies and extensive interpretation of multiple databases.   Critical Care Time devoted to patient care services described in this note is 40 Minutes. This time reflects time of care of this signee: Merrie Roof, MD FACP. This critical care time does not reflect procedure time, or teaching time or supervisory time of PA/NP/Med student/Med Resident etc but could involve care discussion time. Rest per NP/medical resident whose note is outlined above and that I agree with   [name]. Titus Mould, MD, [place] Pgr: [facility] 08/09/2016 11:55 AM

## 2016-08-09 NOTE — Progress Notes (Signed)
Nutrition Follow-up  DOCUMENTATION CODES:   Non-severe (moderate) malnutrition in context of chronic illness  INTERVENTION:   Initiate Vital AF 1.2 @ 25 ml/hr and increase by 10 ml every 12 hours to goal rate of 45 ml/hr (1080 ml/day) 30 ml Prostat BID Provides: 1496 kcal, 111 grams protein, and 875 ml free water.  TF regimen and propofol at current rate providing 1884 total kcal/day (99 % of kcal needs)  Monitor magnesium and phosphorus every 12 hours x 4 occurences. MD to replete as needed, as pt is at risk for refeeding syndrome given moderate malnutrition and very little intake during admission.  NUTRITION DIAGNOSIS:   Malnutrition (Moderate) related to chronic illness (polysubstance abuse) as evidenced by moderate depletion of body fat, moderate depletions of muscle mass, moderate to severe fluid accumulation. Ongoing.   GOAL:   Patient will meet greater than or equal to 90% of their needs Progressing.   MONITOR:   TF tolerance, Vent status, I & O's  REASON FOR ASSESSMENT:   Consult Enteral/tube feeding initiation and management  ASSESSMENT:   46 yo male with PMH of substance abuse, tobacco use, IV drug abuse, who was admitted on 7/13 with MSSA bacteremia, endocarditis, PNA, cellulitis of RLE.  PO intake < 10% prior to intubation 8/3 pt emergently intubated d/t respiratory failure with extensive Diffuse alveolar hemorrhage. Pt has required paralysis d/t dyssynchrony.    Patient is currently intubated on ventilator support MV: 11.5 L/min Temp (24hrs), Avg:98 F (36.7 C), Min:97.1 F (36.2 C), Max:98.7 F (37.1 C)  Propofol: 14.7 ml/hr provides: 388 kcal per day from lipid Medications reviewed and include: folic acid, phenylephrine Labs reviewed: Magnesium 1.4 (L), PO4: 4.3 WNL, K+ 3.9 WNL   Diet Order:  Diet NPO time specified  Skin:   (R foot cellulitis, MASD buttocks)  Last BM:  8/1 small/type 7  Height:   Ht Readings from Last 1 Encounters:   08/06/16 5\' 11"  (1.803 m)    Weight:   Wt Readings from Last 1 Encounters:  08/09/16 180 lb (81.6 kg)  Admission weight: 150 lb (68 kg)  Ideal Body Weight:  78.2 kg  BMI:  Body mass index is 25.1 kg/m.  Estimated Nutritional Needs:   Kcal:  1900  Protein:  100-115 gm  Fluid:  2.1 L  EDUCATION NEEDS:   No education needs identified at this time  Kendell BaneHeather Jullisa Grigoryan RD, LDN, CNSC [phone] Pager [phone] After Hours Pager

## 2016-08-09 NOTE — Progress Notes (Signed)
Pt noted to be hypoglycemic on CBG spot check. CBG 56, 50mL D50 given and MD J. Selena BattenKim notified.  Upon recheck CBG 121.  Orders obtained for Q4 CBG checks and D5NS added at 7375mL/hr.  Will continue to monitor

## 2016-08-09 NOTE — Progress Notes (Signed)
  Echocardiogram 2D Echocardiogram has been performed.  Delcie RochENNINGTON, Eustolia Drennen 08/09/2016, 12:53 PM

## 2016-08-10 ENCOUNTER — Inpatient Hospital Stay (HOSPITAL_COMMUNITY): Payer: Medicaid Other

## 2016-08-10 LAB — BLOOD GAS, ARTERIAL
Acid-base deficit: 13.5 mmol/L — ABNORMAL HIGH (ref 0.0–2.0)
Bicarbonate: 11.9 mmol/L — ABNORMAL LOW (ref 20.0–28.0)
DRAWN BY: 345601
FIO2: 50
MECHVT: 600 mL
O2 SAT: 98.8 %
PATIENT TEMPERATURE: 98.6
PCO2 ART: 25.7 mmHg — AB (ref 32.0–48.0)
PEEP/CPAP: 5 cmH2O
PH ART: 7.286 — AB (ref 7.350–7.450)
PO2 ART: 133 mmHg — AB (ref 83.0–108.0)
RATE: 26 resp/min

## 2016-08-10 LAB — COMPREHENSIVE METABOLIC PANEL
ALT: 11 U/L — AB (ref 17–63)
AST: 20 U/L (ref 15–41)
Albumin: 1.1 g/dL — ABNORMAL LOW (ref 3.5–5.0)
Alkaline Phosphatase: 58 U/L (ref 38–126)
Anion gap: 3 — ABNORMAL LOW (ref 5–15)
BUN: 57 mg/dL — ABNORMAL HIGH (ref 6–20)
CHLORIDE: 124 mmol/L — AB (ref 101–111)
CO2: 14 mmol/L — AB (ref 22–32)
CREATININE: 2.88 mg/dL — AB (ref 0.61–1.24)
Calcium: 8.4 mg/dL — ABNORMAL LOW (ref 8.9–10.3)
GFR calc non Af Amer: 25 mL/min — ABNORMAL LOW (ref >60)
GFR, EST AFRICAN AMERICAN: 29 mL/min — AB (ref >60)
Glucose, Bld: 130 mg/dL — ABNORMAL HIGH (ref 65–99)
POTASSIUM: 3.8 mmol/L (ref 3.5–5.1)
SODIUM: 141 mmol/L (ref 135–145)
Total Bilirubin: 0.8 mg/dL (ref 0.3–1.2)
Total Protein: 5.3 g/dL — ABNORMAL LOW (ref 6.5–8.1)

## 2016-08-10 LAB — CBC
HCT: 23.1 % — ABNORMAL LOW (ref 39.0–52.0)
HEMATOCRIT: 22.2 % — AB (ref 39.0–52.0)
HEMOGLOBIN: 7.5 g/dL — AB (ref 13.0–17.0)
Hemoglobin: 7.2 g/dL — ABNORMAL LOW (ref 13.0–17.0)
MCH: 28.5 pg (ref 26.0–34.0)
MCH: 28.7 pg (ref 26.0–34.0)
MCHC: 32.4 g/dL (ref 30.0–36.0)
MCHC: 32.5 g/dL (ref 30.0–36.0)
MCV: 87.7 fL (ref 78.0–100.0)
MCV: 88.5 fL (ref 78.0–100.0)
PLATELETS: 143 10*3/uL — AB (ref 150–400)
PLATELETS: 122 10*3/uL — AB (ref 150–400)
RBC: 2.53 MIL/uL — AB (ref 4.22–5.81)
RBC: 2.61 MIL/uL — ABNORMAL LOW (ref 4.22–5.81)
RDW: 21.7 % — ABNORMAL HIGH (ref 11.5–15.5)
RDW: 21.9 % — ABNORMAL HIGH (ref 11.5–15.5)
WBC: 8 10*3/uL (ref 4.0–10.5)
WBC: 7.1 10*3/uL (ref 4.0–10.5)

## 2016-08-10 LAB — GLUCOSE, CAPILLARY
GLUCOSE-CAPILLARY: 107 mg/dL — AB (ref 65–99)
GLUCOSE-CAPILLARY: 125 mg/dL — AB (ref 65–99)
GLUCOSE-CAPILLARY: 126 mg/dL — AB (ref 65–99)
Glucose-Capillary: 129 mg/dL — ABNORMAL HIGH (ref 65–99)

## 2016-08-10 LAB — PROTIME-INR
INR: 1.56
Prothrombin Time: 18.8 seconds — ABNORMAL HIGH (ref 11.4–15.2)

## 2016-08-10 LAB — APTT: APTT: 136 s — AB (ref 24–36)

## 2016-08-10 LAB — MAGNESIUM
MAGNESIUM: 1.7 mg/dL (ref 1.7–2.4)
MAGNESIUM: 1.7 mg/dL (ref 1.7–2.4)

## 2016-08-10 LAB — TROPONIN I

## 2016-08-10 LAB — HCV RNA QUANT RFLX ULTRA OR GENOTYP
HCV RNA QNT(LOG COPY/ML): UNDETERMINED {Log_IU}/mL
HepC Qn: <15 IU/mL

## 2016-08-10 LAB — HEPATITIS B SURFACE ANTIGEN

## 2016-08-10 LAB — PROCALCITONIN: Procalcitonin: 0.86 ng/mL

## 2016-08-10 LAB — PHOSPHORUS
PHOSPHORUS: 3.3 mg/dL (ref 2.5–4.6)
Phosphorus: 3.7 mg/dL (ref 2.5–4.6)

## 2016-08-10 LAB — HEPATITIS B SURFACE AG, CONFIRM: HBsAg Confirmation: POSITIVE — AB

## 2016-08-10 NOTE — Progress Notes (Signed)
RN aware of pt temp. Applied several warm blankets. Recheck.

## 2016-08-10 NOTE — Progress Notes (Signed)
[facility] PULMONARY / CRITICAL CARE MEDICINE CONSULT   Name: Johnathan [name] MRN: [id number] DOB: 03/03/1970    ADMISSION DATE:  07/19/2016 CONSULTATION DATE:  07/23/2016  REFERRING MD:  Ree Kida    HISTORY OF PRESENT ILLNESS:   [name] a 46 y.o.male,w Hep C?, IVDA, (recently injected opana) apparently c/o fever intermittently for  1 week, and redness over the dorsum of the right foot and slight swelling. Pt notes injecting Opana about 2 days ago and cellulitis in right foot noted on admission 7/13 with labs showing EKG and abnormal LFTs with hyponatremia 123 and right lower lobe infiltrate on chest x-ray  Found to have MSSA bacteremia, possible meningitis, and LE cellulitis.  Echocardiogram Showed EF 52-84%, grade 1 diastolic dysfunction, tricuspid valve with mobile vegetation. On 7/17 He developed acute encephalopathy with agitation, becoming belligerent and combative. He denies ETOH abuse, but admitted to IVDA. He was placed on CIWA protocol, however, scores remained 19-23, patient was not responsive  to ativan, transferred to ICU for precedex gtt    SUBJECTIVE:  Comfortable on MV currently Did have cough with some hemoptysis when his sedation was lightened this am  VITAL SIGNS: BP 118/76 (BP Location: Left Leg)   Pulse (!) 58   Temp (!) 97.4 F (36.3 C) (Oral)   Resp (!) 26   Ht _0  (1.803 m)   Wt 83.5 kg (184 lb)   SpO2 100%   BMI 25.66 kg/m   HEMODYNAMICS: CVP:  [14 mmHg] 14 mmHg  VENTILATOR SETTINGS: Vent Mode: PRVC FiO2 (%):  [40 %-100 %] 40 % Set Rate:  [20 bmp-26 bmp] 26 bmp Vt Set:  [600 mL] 600 mL PEEP:  [5 cmH20] 5 cmH20 Plateau Pressure:  [20 cmH20-27 cmH20] 20 cmH20  INTAKE / OUTPUT: I/O last 3 completed shifts: In: 5045.4 [I.V.:3790.2; Other:5; NG/GT:650.3; IV Piggyback:600] Out: 151 [Urine:150; Stool:1]  PHYSICAL EXAMINATION: General appearance:  Ill appearing man on MV Eyes: no icterus, PERRL Mouth:  ETT in place, OP clear   Lungs/chest: normal resp pattern, bilateral insp crackles, decreased on R CV: regular 3/6 syst M Abdomen: soft, benign, + BS Extremities: No edema Skin: no rash  Psych: sedated, grimace with stim (on sedation)   LABS:  BMET  Recent Labs Lab 08/08/16 0431 08/09/16 0322 08/10/16 0424  NA 142 142 141  K 3.4* 3.9 3.8  CL 125* 124* 124*  CO2 14* 15* 14*  BUN 49* 54* 57*  CREATININE 1.79* 2.38* 2.88*  GLUCOSE 124* 76 130*    Electrolytes  Recent Labs Lab 08/08/16 0431 08/09/16 0322  08/09/16 1142 08/09/16 1832 08/10/16 0424  CALCIUM 8.8* 8.8*  --   --   --  8.4*  MG  --   --   < > 1.5* 1.4* 1.7  PHOS  --   --   < > 4.3 3.3 3.3  < > = values in this interval not displayed.  CBC  Recent Labs Lab 08/08/16 0431 08/09/16 0322 08/10/16 0424  WBC 5.6 7.1 8.0  HGB 8.2* 8.1* 7.2*  HCT 25.2* 24.9* 22.2*  PLT 134* 146* 143*    Coag's No results for input(s): APTT, INR in the last 168 hours.  Sepsis Markers  Recent Labs Lab 08/09/16 1142 08/10/16 0424  PROCALCITON 0.72 0.86    ABG  Recent Labs Lab 08/09/16 1350 08/10/16 0516  PHART 7.205* 7.286*  PCO2ART 34.6 25.7*  PO2ART 73.0* 133*    Liver Enzymes  Recent Labs Lab 08/10/16 0424  AST 20  ALT  11*  ALKPHOS 58  BILITOT 0.8  ALBUMIN 1.1*    Cardiac Enzymes  Recent Labs Lab 08/09/16 1142 08/09/16 1742 08/10/16 0023  TROPONINI <0.03 <0.03 <0.03    Glucose  Recent Labs Lab 08/09/16 1036 08/09/16 1128 08/09/16 1546 08/09/16 2024 08/10/16 0420 08/10/16 0758  GLUCAP 84 78 78 98 107* 125*    Imaging Portable Chest Xray  Result Date: 08/10/2016 CLINICAL DATA:  Pneumonia EXAM: PORTABLE CHEST 1 VIEW COMPARISON:  Yesterday FINDINGS: Endotracheal tube tip between the clavicular heads and carina. New orogastric tube which at least reaches the stomach. Right upper extremity PICC. New paramediastinal opacity at the right apex. This could be layering pleural fluid, newly opacified bulla,  or atelectasis. Per chart, no interval central line attempt. Pleural effusion on the right where there is diffuse hazy opacity. Underlying lung opacification on the right. Emphysema. IMPRESSION: 1. New right upper paramediastinal opacity that could reflect layering fluid, newly opacified bulla, or atelectasis. No chart history of interval central line attempt. 2. Pleural effusion with atelectasis or pneumonia on the right. Electronically Signed   By: Monte Fantasia M.D.   On: 08/10/2016 07:58   Portable Chest Xray  Result Date: 08/09/2016 CLINICAL DATA:  Hypoxia EXAM: PORTABLE CHEST 1 VIEW COMPARISON:  August 01, 2016. FINDINGS: Endotracheal tube tip is 3.6 cm above the carina. Central catheter tip is at the cavoatrial junction. No pneumothorax. There is a the fairly small right pleural effusion. There is extensive airspace consolidation throughout much of the right lung. Left lung is clear except for mild perihilar atelectasis. Heart is mildly enlarged with pulmonary vascular within normal limits. No adenopathy. No evident bone lesions. IMPRESSION: Tube and catheter positions as described without pneumothorax. Small right pleural effusion with extensive airspace consolidation throughout much of the right lung. Slight left perihilar atelectasis. Stable cardiomegaly. Electronically Signed   By: Lowella Grip III M.D.   On: 08/09/2016 12:24   Dg Abd Portable 1v  Result Date: 08/09/2016 CLINICAL DATA:  Orogastric tube placement. EXAM: PORTABLE ABDOMEN - 1 VIEW COMPARISON:  None. FINDINGS: Orogastric tube in good position with the tip in the antrum and the distal side port in the gastric body. The bowel gas pattern is normal. No radio-opaque calculi or other significant radiographic abnormality are seen. Right lower lobe pleural effusion and atelectasis. IMPRESSION: Orogastric tube in appropriate position. Electronically Signed   By: Titus Dubin M.D.   On: 08/09/2016 15:13     STUDIES:  LP 7/13 >> High  WBC and protein>> suspect meningitis but Cx no growth Echo > EF 53-29%, grade 1 diastolic dysfunction, tricuspid valve with mobile vegetation>> Needs TEE but refusing CT Head 7/16>> Progressive atrophy, negative for edema to suggest septic emboli Renal ultrasound 7/13>> showed mild right pelvicaliectasis with ureteral jet noted and urinary bladder excluding significant ureteral obstruction EEG 8/2: diffuse mod/slowing TTE 8/3 >> normal LV fxn, mild diastolic dysfxn, no apparent vegetations. Trivial TR, no MR or MS, no AS  CULTURES: CSF 7/13 >> No growth Blood cultures >> 07/18/16 MSSA  (2/2), Pseudomonas aeruginosa (however gram stain showed no GN) RVP 7/13>> Unremarkable Repeat Blood cultures >>  07/20/16: 1/2 MSSA bcx2 8/3>>> Bal 8/3>>>  ANTIBIOTICS: Vanc 7/13>> 7/13 Zosyn 7/13>> 7/13 Nafcillin 7/13>> 7/16 Cefepime 7/16>>  vanc 8/3>>>  SIGNIFICANT EVENTS: 7/17>> Transfer to ICU for precedex infusion 7/21 Precedex off.    LINES/TUBES: ETT 8/3 >>   DISCUSSION: Pt. With MSSA + pseudomonas bacteremia and acute Endocarditis w/ Tricuspid vegetation, further c/b AKI,  w/d, cellulitis RLE, cavitary PNA on right from septic emboli w/ right parapneumonic pleural effusion also LUE DVT.    PCCM had singed off on 7/25.   From 7/25 to 8/3:  Weaned off precedex for wd. abx narrowed to cover w/ cefepime thru 8/25 On 8/2 became hypothermic, more agitated 8/3 developed acute tachypnea w/ "guppy breathing". Emergently intubated. FOB at bedside w/ mild diffuse alveolar hemorrhage.  Transferred to our service in ICU   ASSESSMENT / PLAN:  PULMONARY A: Acute Hypoxic Respiratory Failure in setting of edema, hemoptysis. Consider DAH but more likely a focal source. None obvious on FOB 8/3 Cavitary PNA in setting of septic Pulmonary emboli c/b parapneumonic effusion  P: Plan repeat CT chest 8/4 Hold heparin in event he needs any intervention including possible R chest tube Follow cx data, abx as  below  CARDIOVASCULAR A:  Sepsis secondary to MSSA bacteremia- TV endocarditis; no acute valvular dysfxn TTE 8/3 LUE DVT P: heparin on hold Anti-HTN meds on hold TTE reassuring, no evidence acute valvular failure or progression vegetation.   RENAL A:   Recurrent AKI NAGMA d/t hyperchloremia, also appears to be element of AGMA (suspect lactic acidosis d/t respiratory failure) P:   Renal dose meds Follow BMP, UOP Avoid nephrotoxins and ensure adequate renal perfusion.   GASTROINTESTINAL A:   Protein calorie malnutrition-mild P:   TF's PPI for SUP  HEMATOLOGIC A:   Anemia of critical illness, acute blood loss due to hemoptysis Mild thrombocytopenia  LUE DVT P:  SCD's Heparin on hold for now, will restart depending on degree of hemoptysis, whether he will need any procedures.  Follow CBC  INFECTIOUS A:   MSSA bacteremia and pseudomonas endocarditis Cavitary PNA in setting of septic emboli Right LE cellulitis (resolved) H/o HCV  Possible new sepsis 8/3 P:   Plan cefepime thru 8/25 vanco added 8/3, tailor to cx data Follow repeat culture data Appreciate ID assistance  ENDOCRINE A:   Hypoglycemia  P:   Continue DLR IVF Follow CBG  NEUROLOGIC A:   Acute encephalopathy. Out of time range for EtOH withdrawal. Likely metabolic  P:   PAD protocol RAS goal -1 to -2 Supportive care  Suboxone stopped Continuous sedation.   FAMILY  - Updates: No family at bedside 7/19  - Inter-disciplinary family meet or Palliative Care meeting due by:  7/25 .  Independent CC time 35 minutes   Baltazar Apo, MD, PhD 08/10/2016, 10:56 AM [facility] [phone] or if no answer [phone]

## 2016-08-11 ENCOUNTER — Inpatient Hospital Stay (HOSPITAL_COMMUNITY): Payer: Medicaid Other

## 2016-08-11 LAB — CULTURE, BAL-QUANTITATIVE W GRAM STAIN

## 2016-08-11 LAB — BASIC METABOLIC PANEL
ANION GAP: 3 — AB (ref 5–15)
Anion gap: 4 — ABNORMAL LOW (ref 5–15)
BUN: 63 mg/dL — AB (ref 6–20)
BUN: 65 mg/dL — AB (ref 6–20)
CHLORIDE: 122 mmol/L — AB (ref 101–111)
CO2: 14 mmol/L — AB (ref 22–32)
CO2: 14 mmol/L — ABNORMAL LOW (ref 22–32)
CREATININE: 3.77 mg/dL — AB (ref 0.61–1.24)
Calcium: 8.3 mg/dL — ABNORMAL LOW (ref 8.9–10.3)
Calcium: 8.4 mg/dL — ABNORMAL LOW (ref 8.9–10.3)
Chloride: 123 mmol/L — ABNORMAL HIGH (ref 101–111)
Creatinine, Ser: 3.36 mg/dL — ABNORMAL HIGH (ref 0.61–1.24)
GFR calc Af Amer: 24 mL/min — ABNORMAL LOW (ref >60)
GFR calc Af Amer: 21 mL/min — ABNORMAL LOW (ref >60)
GFR calc non Af Amer: 21 mL/min — ABNORMAL LOW (ref >60)
GFR calc non Af Amer: 18 mL/min — ABNORMAL LOW (ref >60)
GLUCOSE: 155 mg/dL — AB (ref 65–99)
GLUCOSE: 154 mg/dL — AB (ref 65–99)
POTASSIUM: 4.1 mmol/L (ref 3.5–5.1)
POTASSIUM: 4 mmol/L (ref 3.5–5.1)
SODIUM: 140 mmol/L (ref 135–145)
Sodium: 140 mmol/L (ref 135–145)

## 2016-08-11 LAB — CBC
HEMATOCRIT: 22.9 % — AB (ref 39.0–52.0)
Hemoglobin: 7.4 g/dL — ABNORMAL LOW (ref 13.0–17.0)
MCH: 28.5 pg (ref 26.0–34.0)
MCHC: 32.3 g/dL (ref 30.0–36.0)
MCV: 88.1 fL (ref 78.0–100.0)
PLATELETS: 122 10*3/uL — AB (ref 150–400)
RBC: 2.6 MIL/uL — AB (ref 4.22–5.81)
RDW: 21.8 % — AB (ref 11.5–15.5)
WBC: 6.9 10*3/uL (ref 4.0–10.5)

## 2016-08-11 LAB — CULTURE, BAL-QUANTITATIVE

## 2016-08-11 LAB — BODY FLUID CELL COUNT WITH DIFFERENTIAL
EOS FL: 1 %
Lymphs, Fluid: 1 %
Monocyte-Macrophage-Serous Fluid: 0 % — ABNORMAL LOW (ref 50–90)
NEUTROPHIL FLUID: 98 % — AB (ref 0–25)
Total Nucleated Cell Count, Fluid: 616 cu mm (ref 0–1000)

## 2016-08-11 LAB — GLUCOSE, CAPILLARY
GLUCOSE-CAPILLARY: 139 mg/dL — AB (ref 65–99)
GLUCOSE-CAPILLARY: 191 mg/dL — AB (ref 65–99)
Glucose-Capillary: 122 mg/dL — ABNORMAL HIGH (ref 65–99)
Glucose-Capillary: 133 mg/dL — ABNORMAL HIGH (ref 65–99)
Glucose-Capillary: 147 mg/dL — ABNORMAL HIGH (ref 65–99)

## 2016-08-11 LAB — LACTIC ACID, PLASMA: LACTIC ACID, VENOUS: 1 mmol/L (ref 0.5–1.9)

## 2016-08-11 LAB — VANCOMYCIN, TROUGH: VANCOMYCIN TR: 32 ug/mL — AB (ref 15–20)

## 2016-08-11 LAB — PROCALCITONIN: Procalcitonin: 1.14 ng/mL

## 2016-08-11 MED ORDER — FENTANYL 2500MCG IN NS 250ML (10MCG/ML) PREMIX INFUSION
25.0000 ug/h | INTRAVENOUS | Status: DC
  Administered 2016-08-11: 50 ug/h via INTRAVENOUS
  Administered 2016-08-12 (×2): 250 ug/h via INTRAVENOUS
  Administered 2016-08-13 – 2016-08-14 (×5): 300 ug/h via INTRAVENOUS
  Administered 2016-08-14: 350 ug/h via INTRAVENOUS
  Administered 2016-08-15: 400 ug/h via INTRAVENOUS
  Administered 2016-08-15: 350 ug/h via INTRAVENOUS
  Administered 2016-08-16: 300 ug/h via INTRAVENOUS
  Filled 2016-08-11 (×13): qty 250

## 2016-08-11 MED ORDER — LIDOCAINE HCL (PF) 1 % IJ SOLN
INTRAMUSCULAR | Status: AC
Start: 2016-08-11 — End: 2016-08-11
  Administered 2016-08-11: 30 mL
  Filled 2016-08-11: qty 30

## 2016-08-11 MED ORDER — SODIUM BICARBONATE 8.4 % IV SOLN
INTRAVENOUS | Status: DC
  Administered 2016-08-11 – 2016-08-14 (×7): via INTRAVENOUS
  Filled 2016-08-11 (×12): qty 150

## 2016-08-11 MED ORDER — DEXTROSE 5 % IV SOLN
1.0000 g | INTRAVENOUS | Status: DC
  Administered 2016-08-11: 1 g via INTRAVENOUS
  Filled 2016-08-11 (×2): qty 1

## 2016-08-11 MED ORDER — LIDOCAINE HCL (PF) 1 % IJ SOLN
INTRAMUSCULAR | Status: AC
  Administered 2016-08-11: 5 mL
  Filled 2016-08-11: qty 5

## 2016-08-11 NOTE — Progress Notes (Addendum)
Pharmacy Antibiotic Note  Johnathan [name] is a 46 year old male with hx Hep C, IVDA who presented on 7/13 with R-foot redness/swelling with fevers. The patient is noted to have MSSA + Psuedomonas bacteremia with TTE positive for tricuspid valve IE. ID on board. He continues on IV vancomycin and cefepime but renal fxn is worsening. SCr is up to 3.36. Pt is afebrile and WBC is WNL.   Plan: Change cefepime to 1gm IV Q24H Hold vancomycin - check a level when the next dose would have been due F/u renal fxn, C&S, clinical status and LOT  Addendum: A random vancomycin level tonight was elevated at 32 as expected with worsening renal function. Continue to hold vanc and will follow-up AM level to determine how is he clearing it.  Height: _0  (180.3 cm) Weight: 184 lb (83.5 kg) IBW/kg (Calculated) : 75.3  Temp (24hrs), Avg:97.2 F (36.2 C), Min:95 F (35 C), Max:97.8 F (36.6 C)   Recent Labs Lab 08/07/16 0521 08/08/16 0431 08/09/16 0322 08/10/16 0424 08/10/16 1700 08/11/16 0245  WBC 6.4 5.6 7.1 8.0 7.1 6.9  CREATININE 1.46* 1.79* 2.38* 2.88*  --  3.36*     Zosyn 7/13 >> 7/13 Vanc 7/13 >> 7/13; 8/3 >> Cefepime 7/13 >> [8/25] Nafcillin 7/13 >> 7/16  7/12 BCx - MSSA + PSA (pan sensitive) 7/13 CSF cx - negative 7/13 RVP- negative 7/13 MRSA PCR - negative 7/13 UCx - negative 7/14 BCx - 1/2 MSSA 7/15 BCx - neg 7/17 Urine- neg 7/26 pleural fluid- gram stain neg, cx ngF 7/26 AFB- neg 7/26 Fungal: in process  8/3 blood cx: pending 8/3 BAL: NGTD 8/3 Blood - NGTD  Salome Arnt, PharmD, BCPS Pager # [phone] 08/11/2016 9:32 AM

## 2016-08-11 NOTE — Progress Notes (Signed)
eLink Physician-Brief Progress Note Patient Name: Johnathan HolesChristopher XXXPallas DOB: 1970-02-19 MRN: [id number]   Date of Service  08/11/2016  HPI/Events of Note  Bradycardia - Likely d/t a Propofol IV infusion. Propofol IV infusion now off. Nursing questions EKG changes on bedside monitor.   eICU Interventions  Will order: 1. 12 Lead EKG STAT. 2. D/C Propofol IV infusion. 3. Fentanyl IV infusion. Titrate to RASS = 0 to -1.     Intervention Category Major Interventions: Arrhythmia - evaluation and management  Namir Neto Eugene 08/11/2016, 7:01 PM

## 2016-08-11 NOTE — Procedures (Signed)
Chest Tube Insertion Procedure Note  Indications:  Clinically significant Empyema and Effusion  Pre-operative Diagnosis: Effusion  Post-operative Diagnosis: Hemothorax and Empyema  Procedure Details  Informed consent was obtained for the procedure, including sedation.  Risks of lung perforation, hemorrhage, arrhythmia, and adverse drug reaction were discussed.   After sterile skin prep, using standard technique, a 32 French tube was placed in the right lateral  rib space at the midaxillary line.  Findings: 350 ml of serous fluid obtained  Estimated Blood Loss:  less than 50 mL         Specimens:  None              Complications:  None; patient tolerated the procedure well.         Disposition: ICU - intubated and critically ill.         Condition: stable  Simonne MartinetPeter E Humza Tallerico ACNP-BC [facility] Pager # [phone] OR # [phone] if no answer

## 2016-08-11 NOTE — Progress Notes (Signed)
[facility] PULMONARY / CRITICAL CARE MEDICINE CONSULT   Name: Johnathan [name] MRN: [id number] DOB: Nov 24, 1970    ADMISSION DATE:  07/19/2016 CONSULTATION DATE:  07/23/2016  REFERRING MD:  Johnathan [name]    HISTORY OF PRESENT ILLNESS:   Johnathan [name] a 46 y.o.male,w Hep C?, IVDA, (recently injected opana) apparently c/o fever intermittently for  1 week, and redness over the dorsum of the right foot and slight swelling. Pt notes injecting Opana about 2 days ago and cellulitis in right foot noted on admission 7/13 with labs showing EKG and abnormal LFTs with hyponatremia 123 and right lower lobe infiltrate on chest x-ray  Found to have Johnathan [name] bacteremia, possible meningitis, and LE cellulitis.  Echocardiogram Showed EF 32-67%, grade 1 diastolic dysfunction, tricuspid valve with mobile vegetation. On 7/17 He developed acute encephalopathy with agitation, becoming belligerent and combative. He denies ETOH abuse, but admitted to IVDA. He was placed on CIWA protocol, however, scores remained 19-23, patient was not responsive  to ativan, transferred to ICU for precedex gtt    SUBJECTIVE:  Minimal urine output over the last 24 hours Note worsening renal failure CT chest as below  VITAL SIGNS: BP 120/86   Pulse 83   Temp (!) 97.3 F (36.3 C) (Oral)   Resp (!) 23   Ht _0  (1.803 m)   Wt 83.5 kg (184 lb)   SpO2 100%   BMI 25.66 kg/m   HEMODYNAMICS: CVP:  [10 mmHg-13 mmHg] 10 mmHg  VENTILATOR SETTINGS: Vent Mode: PRVC FiO2 (%):  [40 %] 40 % Set Rate:  [26 bmp] 26 bmp Vt Set:  [600 mL] 600 mL PEEP:  [5 cmH20] 5 cmH20 Pressure Support:  [10 cmH20] 10 cmH20 Plateau Pressure:  [20 cmH20-22 cmH20] 20 cmH20  INTAKE / OUTPUT: I/O last 3 completed shifts: In: 5941.6 [I.V.:4361.6; NG/GT:1530; IV Piggyback:50] Out: 130 [Urine:130]  PHYSICAL EXAMINATION: General appearance:  Ill appearing man on MV Eyes: no icterus, PERRL Mouth:  ETT in place, OP clear  Lungs/chest: normal resp  pattern, bilateral insp crackles, decreased on R CV: regular 3/6 syst M Abdomen: soft, benign, + BS Extremities: No edema Skin: no rash  Psych: sedated, grimace with stim (on sedation)   LABS:  BMET  Recent Labs Lab 08/09/16 0322 08/10/16 0424 08/11/16 0245  NA 142 141 140  K 3.9 3.8 4.1  CL 124* 124* 123*  CO2 15* 14* 14*  BUN 54* 57* 63*  CREATININE 2.38* 2.88* 3.36*  GLUCOSE 76 130* 155*    Electrolytes  Recent Labs Lab 08/09/16 0322  08/09/16 1832 08/10/16 0424 08/10/16 1700 08/11/16 0245  CALCIUM 8.8*  --   --  8.4*  --  8.3*  MG  --   < > 1.4* 1.7 1.7  --   PHOS  --   < > 3.3 3.3 3.7  --   < > = values in this interval not displayed.  CBC  Recent Labs Lab 08/10/16 0424 08/10/16 1700 08/11/16 0245  WBC 8.0 7.1 6.9  HGB 7.2* 7.5* 7.4*  HCT 22.2* 23.1* 22.9*  PLT 143* 122* 122*    Coag's  Recent Labs Lab 08/10/16 1700  APTT 136*  INR 1.56    Sepsis Markers  Recent Labs Lab 08/09/16 1142 08/10/16 0424 08/11/16 0245  PROCALCITON 0.72 0.86 1.14    ABG  Recent Labs Lab 08/09/16 1350 08/10/16 0516  PHART 7.205* 7.286*  PCO2ART 34.6 25.7*  PO2ART 73.0* 133*    Liver Enzymes  Recent Labs Lab 08/10/16 0424  AST 20  ALT 11*  ALKPHOS 58  BILITOT 0.8  ALBUMIN 1.1*    Cardiac Enzymes  Recent Labs Lab 08/09/16 1142 08/09/16 1742 08/10/16 0023  TROPONINI <0.03 <0.03 <0.03    Glucose  Recent Labs Lab 08/10/16 0758 08/10/16 1207 08/10/16 1626 08/11/16 0020 08/11/16 0417 08/11/16 0735  GLUCAP 125* 126* 129* 122* 139* 133*    Imaging No results found.   STUDIES:  LP 7/13 >> High WBC and protein>> suspect meningitis but Cx no growth Echo > EF 21-22%, grade 1 diastolic dysfunction, tricuspid valve with mobile vegetation>> Needs TEE but refusing CT Head 7/16>> Progressive atrophy, negative for edema to suggest septic emboli Renal ultrasound 7/13>> showed mild right pelvicaliectasis with ureteral jet noted and  urinary bladder excluding significant ureteral obstruction EEG 8/2: diffuse mod/slowing TTE 8/3 >> normal LV fxn, mild diastolic dysfxn, no apparent vegetations. Trivial TR, no MR or MS, no AS  CULTURES: CSF 7/13 >> No growth Blood cultures >> 07/18/16 Johnathan [name]  (2/2), Pseudomonas aeruginosa (however gram stain showed no GN) RVP 7/13>> Unremarkable Repeat Blood cultures >>  07/20/16: 1/2 Johnathan [name] bcx2 8/3>>> Bal 8/3>>>  ANTIBIOTICS: Vanc 7/13>> 7/13 Zosyn 7/13>> 7/13 Nafcillin 7/13>> 7/16 Cefepime 7/16>>  vanc 8/3>>>  SIGNIFICANT EVENTS: 7/17>> Transfer to ICU for precedex infusion 7/21 Precedex off.    LINES/TUBES: ETT 8/3 >>   DISCUSSION: Pt. With Johnathan [name] + pseudomonas bacteremia and acute Endocarditis w/ Tricuspid vegetation, further c/b AKI, w/d, cellulitis RLE, cavitary PNA on right from septic emboli w/ right parapneumonic pleural effusion also LUE DVT.    PCCM had singed off on 7/25.   From 7/25 to 8/3:  Weaned off precedex for wd. abx narrowed to cover w/ cefepime thru 8/25 On 8/2 became hypothermic, more agitated 8/3 developed acute tachypnea w/ "guppy breathing". Emergently intubated. FOB at bedside w/ mild diffuse alveolar hemorrhage.  Transferred to our service in ICU   ASSESSMENT / PLAN:  PULMONARY A: Acute Hypoxic Respiratory Failure in setting of edema, hemoptysis. Consider DAH but more likely a focal source. None obvious on FOB 8/3 Complicated multifocal right lower lobe and middle lobe PNA in setting of septic Pulmonary emboli, c/b large parapneumonic effusion P: Continue to hold heparin. Suspect that he will need a right chest tube versus thoracentesis Continue antibiotics as below adjust based on culture data  CARDIOVASCULAR A:  Sepsis secondary to Johnathan [name] bacteremia- TV endocarditis; no acute valvular dysfxn or change on TTE 8/3 LUE DVT P: Continue to hold heparin given recent hemoptysis, potential need for procedure. Goal restart on 8/6 Hold antihypertension  medications  RENAL A:   Recurrent AKI, progressive and now oliguric 8/5 NAGMA d/t hyperchloremia, also appears to be element of AGMA, worse 8/5 P:   Renal dose meds Nuclear urgent indication for dialysis although he may ultimately require. We will consult nephrology if he does not improved next 24 hours Start bicarbonate drip 8/5 Follow BMP, urine output Avoid nephrotoxins Ensure adequate renal perfusion Repeat lactic acid  GASTROINTESTINAL A:   Protein calorie malnutrition-mild P:   TF PPI for SUP  HEMATOLOGIC A:   Anemia of critical illness, acute blood loss due to hemoptysis Mild thrombocytopenia  LUE DVT P:  SCD Heparin on hold for now, will restart depending on degree of hemoptysis, whether he will need any procedures.  Follow CBC  INFECTIOUS A:   Johnathan [name] bacteremia and pseudomonas endocarditis Right lower lobe and right middle lobe Cavitary PNA in setting of septic emboli Right LE cellulitis (resolved) H/o HCV  Possible new sepsis 8/3 P:   Plan continue current antibiotics May need tube drainage of his right pleural space Plan cefepime thru 8/25 vanco added 8/3, tailor to cx data Follow repeat culture data Appreciate ID assistance  ENDOCRINE A:   Hypoglycemia  P:   Continue DLR IVF Follow CBG  NEUROLOGIC A:   Acute encephalopathy. Out of time range for EtOH withdrawal. Likely metabolic  P:   PAD protocol RAS goal -1 to -2 Supportive care  Suboxone discontinued Continuous sedation as ordered, daily wakeup assessment  FAMILY  - Updates: No family at bedside 8/5  - Inter-disciplinary family meet or Palliative Care meeting due by: 08/16/16  Independent CC time 34 minutes   Baltazar Apo, MD, PhD 08/11/2016, 9:52 AM [facility] [phone] or if no answer [phone]

## 2016-08-11 NOTE — Progress Notes (Signed)
Called elink, Dr Deterding informed of pt urine output of 10cc for past 6 hours

## 2016-08-11 NOTE — Progress Notes (Signed)
eLink Physician-Brief Progress Note Patient Name: Johnathan HolesChristopher XXXPallas DOB: 1970-05-16 MRN: [id number]   Date of Service  08/11/2016  HPI/Events of Note  Hypothermia - Temp = 94.2 F. Patient already on Vancomycin and Cefepime.   eICU Interventions  Will order: 1. [employer]. 2. Blood Cultures X 2.      Intervention Category Major Interventions: Infection - evaluation and management  Sommer,Steven Eugene 08/11/2016, 9:38 PM

## 2016-08-11 NOTE — Progress Notes (Signed)
Pt transferred to ct on cardiac monitor and vent, RN RT and ct transporter at bedside

## 2016-08-12 ENCOUNTER — Inpatient Hospital Stay (HOSPITAL_COMMUNITY): Payer: Medicaid Other

## 2016-08-12 LAB — BASIC METABOLIC PANEL
Anion gap: 7 (ref 5–15)
BUN: 67 mg/dL — AB (ref 6–20)
CALCIUM: 8.5 mg/dL — AB (ref 8.9–10.3)
CHLORIDE: 118 mmol/L — AB (ref 101–111)
CO2: 16 mmol/L — ABNORMAL LOW (ref 22–32)
Creatinine, Ser: 3.94 mg/dL — ABNORMAL HIGH (ref 0.61–1.24)
GFR calc Af Amer: 20 mL/min — ABNORMAL LOW (ref >60)
GFR, EST NON AFRICAN AMERICAN: 17 mL/min — AB (ref >60)
GLUCOSE: 143 mg/dL — AB (ref 65–99)
POTASSIUM: 3.9 mmol/L (ref 3.5–5.1)
SODIUM: 141 mmol/L (ref 135–145)

## 2016-08-12 LAB — CBC
HCT: 23.8 % — ABNORMAL LOW (ref 39.0–52.0)
Hemoglobin: 7.7 g/dL — ABNORMAL LOW (ref 13.0–17.0)
MCH: 28.2 pg (ref 26.0–34.0)
MCHC: 32.4 g/dL (ref 30.0–36.0)
MCV: 87.2 fL (ref 78.0–100.0)
PLATELETS: 120 10*3/uL — AB (ref 150–400)
RBC: 2.73 MIL/uL — AB (ref 4.22–5.81)
RDW: 21.6 % — ABNORMAL HIGH (ref 11.5–15.5)
WBC: 6.7 10*3/uL (ref 4.0–10.5)

## 2016-08-12 LAB — GLUCOSE, CAPILLARY
GLUCOSE-CAPILLARY: 116 mg/dL — AB (ref 65–99)
GLUCOSE-CAPILLARY: 105 mg/dL — AB (ref 65–99)
Glucose-Capillary: 127 mg/dL — ABNORMAL HIGH (ref 65–99)
Glucose-Capillary: 139 mg/dL — ABNORMAL HIGH (ref 65–99)
Glucose-Capillary: 109 mg/dL — ABNORMAL HIGH (ref 65–99)
Glucose-Capillary: 103 mg/dL — ABNORMAL HIGH (ref 65–99)

## 2016-08-12 LAB — VANCOMYCIN, RANDOM: Vancomycin Rm: 30

## 2016-08-12 LAB — LACTIC ACID, PLASMA: LACTIC ACID, VENOUS: 1 mmol/L (ref 0.5–1.9)

## 2016-08-12 LAB — PATHOLOGIST SMEAR REVIEW

## 2016-08-12 LAB — TRIGLYCERIDES: TRIGLYCERIDES: 80 mg/dL (ref <150)

## 2016-08-12 LAB — MAGNESIUM: Magnesium: 1.5 mg/dL — ABNORMAL LOW (ref 1.7–2.4)

## 2016-08-12 MED ORDER — VITAMIN K1 10 MG/ML IJ SOLN
5.0000 mg | Freq: Once | INTRAMUSCULAR | Status: AC
  Administered 2016-08-12: 5 mg via INTRAVENOUS
  Filled 2016-08-12: qty 0.5

## 2016-08-12 MED ORDER — FENTANYL CITRATE (PF) 100 MCG/2ML IJ SOLN: 25.0000 ug | INTRAMUSCULAR | Status: DC | PRN

## 2016-08-12 MED ORDER — MIDAZOLAM HCL 2 MG/2ML IJ SOLN
1.0000 mg | INTRAMUSCULAR | Status: DC | PRN
  Administered 2016-08-12 – 2016-08-13 (×3): 4 mg via INTRAVENOUS
  Administered 2016-08-13 (×4): 2 mg via INTRAVENOUS
  Administered 2016-08-13: 4 mg via INTRAVENOUS
  Administered 2016-08-14 – 2016-08-15 (×8): 2 mg via INTRAVENOUS
  Administered 2016-08-15: 4 mg via INTRAVENOUS
  Administered 2016-08-15 – 2016-08-18 (×8): 2 mg via INTRAVENOUS
  Filled 2016-08-12: qty 4
  Filled 2016-08-12 (×2): qty 2
  Filled 2016-08-12: qty 4
  Filled 2016-08-12 (×6): qty 2
  Filled 2016-08-12: qty 4
  Filled 2016-08-12 (×5): qty 2
  Filled 2016-08-12: qty 4
  Filled 2016-08-12 (×4): qty 2
  Filled 2016-08-12: qty 4
  Filled 2016-08-12 (×3): qty 2

## 2016-08-12 MED ORDER — THIAMINE HCL 100 MG/ML IJ SOLN
100.0000 mg | Freq: Every day | INTRAMUSCULAR | Status: DC
  Administered 2016-08-12 – 2016-08-19 (×8): 100 mg via INTRAVENOUS
  Filled 2016-08-12 (×7): qty 1
  Filled 2016-08-12: qty 2
  Filled 2016-08-12: qty 1

## 2016-08-12 MED ORDER — DEXTROSE 5 % IV SOLN
2.0000 g | INTRAVENOUS | Status: DC
  Administered 2016-08-12 – 2016-08-18 (×7): 2 g via INTRAVENOUS
  Filled 2016-08-12 (×8): qty 2

## 2016-08-12 MED ORDER — IPRATROPIUM BROMIDE 0.02 % IN SOLN
0.5000 mg | Freq: Four times a day (QID) | RESPIRATORY_TRACT | Status: DC
  Administered 2016-08-12 – 2016-08-20 (×35): 0.5 mg via RESPIRATORY_TRACT
  Filled 2016-08-12 (×35): qty 2.5

## 2016-08-12 MED ORDER — MAGNESIUM SULFATE 2 GM/50ML IV SOLN
2.0000 g | Freq: Once | INTRAVENOUS | Status: AC
  Administered 2016-08-12: 2 g via INTRAVENOUS
  Filled 2016-08-12: qty 50

## 2016-08-12 MED ORDER — FENTANYL BOLUS VIA INFUSION
25.0000 ug | INTRAVENOUS | Status: DC | PRN
  Administered 2016-08-12: 100 ug via INTRAVENOUS
  Administered 2016-08-12 (×2): 50 ug via INTRAVENOUS
  Administered 2016-08-13: 100 ug via INTRAVENOUS
  Administered 2016-08-13: 50 ug via INTRAVENOUS
  Administered 2016-08-13 (×3): 100 ug via INTRAVENOUS
  Administered 2016-08-14: 50 ug via INTRAVENOUS
  Filled 2016-08-12: qty 100

## 2016-08-12 NOTE — Progress Notes (Signed)
Called elink, Dr Arsenio LoaderSommer informed of pt temp 94.2 rectal

## 2016-08-12 NOTE — Clinical Social Work Note (Signed)
Clinical Social Work Assessment  Patient Details  Name: Johnathan [name] MRN: [id number] Date of Birth: June 12, 1970  Date of referral:  08/12/16               Reason for consult:  Facility Placement                Permission sought to share information with:  Family Supports Permission granted to share information::  Yes, Verbal Permission Granted  Name::     Johnathan [name] (Mother)   Agency::     Relationship::     Contact Information:     Housing/Transportation Living arrangements for the past 2 months:  Single Family Home (with 46 year old son. ) Source of Information:  [name] Patient Interpreter Needed:  None Criminal Activity/Legal Involvement Pertinent to Current Situation/Hospitalization:  No - Comment as needed Significant Relationships:  Parents Lives with:  Self Do you feel safe going back to the place where you live?  Yes Need for family participation in patient care:  Yes (Comment)  Care giving concerns:  CSW spoke with pt's mother via phone due to pt being intubated at this time. CSW spoke with mom about pt's care at this time as well as any other concerns. Pt's mother informed CSW that mother currently does not have concerns at this time.    Social Worker assessment / plan:  CSW spoke with Johnathan [name] (pt's mother) since pt is currently intubated. CSW was informed that pt has been living at pt's home with 46 year old son. Pt's mother informed CSW that pt is usually independent and takes care of self and son. Pt has support from mom, dad, grandmother, and aunts. Mom mentioned that if pt needs are adjusted at the time of discharge then she and pt's dad would be willing to help with those needs as they arise.    Employment status:    Insurance information:  Self Pay (Medicaid Pending) PT Recommendations:  Not assessed at this time Information / Referral to community resources:     Patient/Family's Response to care:  Pt's family is accepting and agreeable to plan  of care at this time.   Patient/Family's Understanding of and Emotional Response to Diagnosis, Current Treatment, and Prognosis:  No further questions or concerns have been identified at this time.   Emotional Assessment Appearance:  Other (Comment Required (spoek with mother via phone. ) Attitude/Demeanor/Rapport:  Unable to Assess Affect (typically observed):  Unable to Assess Orientation:  Fluctuating Orientation (Suspected and/or reported Sundowners) (pt is currently intubated. ) Alcohol / Substance use:  Not Applicable Psych involvement (Current and /or in the community):  No (Comment)  Discharge Needs  Concerns to be addressed:  No discharge needs identified Readmission within the last 30 days:  No Current discharge risk:  None Barriers to Discharge:  No Barriers Identified   Robb MatarKierra S Ivalene Platte, LCSWA 08/12/2016, 11:20 AM

## 2016-08-12 NOTE — Progress Notes (Signed)
Pt MSSA infection does not require contact precautions per Infection Prevention. D/C contact precautions.

## 2016-08-12 NOTE — Progress Notes (Signed)
[facility] PULMONARY / CRITICAL CARE MEDICINE CONSULT   Name: Johnathan [name] MRN: [id number] DOB: 10/29/1970    ADMISSION DATE:  07/19/2016 CONSULTATION DATE:  07/23/2016  REFERRING MD:  Ree Kida  HISTORY OF PRESENT ILLNESS:   Johnathan [name] a 46 y.o.male,w Hep C?, IVDA, (recently injected opana) apparently c/o fever intermittently for  1 week, and redness over the dorsum of the right foot and slight swelling. Pt notes injecting Opana about 2 days ago and cellulitis in right foot noted on admission 7/13 with labs showing EKG and abnormal LFTs with hyponatremia 123 and right lower lobe infiltrate on chest x-ray. Found to have MSSA bacteremia, possible meningitis, and LE cellulitis.  Echocardiogram Showed EF 66-44%, grade 1 diastolic dysfunction, tricuspid valve with mobile vegetation. On 7/17 He developed acute encephalopathy with agitation, becoming belligerent and combative. He denies ETOH abuse, but admitted to IVDA. He was placed on CIWA protocol, however, scores remained 19-23, patient was not responsive  to ativan, transferred to ICU for precedex gtt   SUBJECTIVE: No acute events overnight. Patient still oliguric to anuric with urine output. Still with bloody endotracheal secretions.   REVIEW OF SYSTEMS:  Unable to obtain given intubation & sedation.   VITAL SIGNS: BP 103/76 (BP Location: Left Leg)   Pulse 73   Temp (!) 97.5 F (36.4 C) (Oral)   Resp (!) 26   Ht _0  (1.803 m)   Wt 201 lb (91.2 kg)   SpO2 100%   BMI 28.03 kg/m   HEMODYNAMICS: CVP:  [8 mmHg-15 mmHg] 15 mmHg  VENTILATOR SETTINGS: Vent Mode: PRVC FiO2 (%):  [40 %] 40 % Set Rate:  [26 bmp] 26 bmp Vt Set:  [600 mL] 600 mL PEEP:  [5 cmH20] 5 cmH20 Plateau Pressure:  [20 cmH20-26 cmH20] 20 cmH20  INTAKE / OUTPUT: I/O last 3 completed shifts: In: 0347.4 [I.V.:4961.6; NG/GT:1440; IV Piggyback:50] Out: 2485 [Urine:60; Stool:525; Chest Tube:1900]  PHYSICAL EXAMINATION: General:  Sedated. No acute  distress. No family at bedside.  Integument:  Warm & dry. No rash on exposed skin. Epidermal sloughing of soles of feet. Extremities:  No cyanosis or clubbing.  HEENT:  Moist mucus membranes. No scleral injection or icterus. Endotracheal tube in place. Cardiovascular:  Regular rate. No edema. No appreciable JVD.  Pulmonary:  Coarse breath sounds with auscultation bilaterally. Symmetric chest wall rise on ventilator. Abdomen: Soft. Hypoactive bowel sounds. Protuberant. Neurological: Pupils symmetric. No spontaneous movements.   LABS:  BMET  Recent Labs Lab 08/11/16 0245 08/11/16 1800 08/12/16 0400  NA 140 140 141  K 4.1 4.0 3.9  CL 123* 122* 118*  CO2 14* 14* 16*  BUN 63* 65* 67*  CREATININE 3.36* 3.77* 3.94*  GLUCOSE 155* 154* 143*    Electrolytes  Recent Labs Lab 08/09/16 1832 08/10/16 0424 08/10/16 1700 08/11/16 0245 08/11/16 1800 08/12/16 0400  CALCIUM  --  8.4*  --  8.3* 8.4* 8.5*  MG 1.4* 1.7 1.7  --   --  1.5*  PHOS 3.3 3.3 3.7  --   --   --     CBC  Recent Labs Lab 08/10/16 1700 08/11/16 0245 08/12/16 0400  WBC 7.1 6.9 6.7  HGB 7.5* 7.4* 7.7*  HCT 23.1* 22.9* 23.8*  PLT 122* 122* 120*    Coag's  Recent Labs Lab 08/10/16 1700  APTT 136*  INR 1.56    Sepsis Markers  Recent Labs Lab 08/09/16 1142 08/10/16 0424 08/11/16 0245 08/11/16 1800 08/12/16 0400  LATICACIDVEN  --   --   --  1.0 1.0  PROCALCITON 0.72 0.86 1.14  --   --     ABG  Recent Labs Lab 08/09/16 1350 08/10/16 0516  PHART 7.205* 7.286*  PCO2ART 34.6 25.7*  PO2ART 73.0* 133*    Liver Enzymes  Recent Labs Lab 08/10/16 0424  AST 20  ALT 11*  ALKPHOS 58  BILITOT 0.8  ALBUMIN 1.1*    Cardiac Enzymes  Recent Labs Lab 08/09/16 1142 08/09/16 1742 08/10/16 0023  TROPONINI <0.03 <0.03 <0.03    Glucose  Recent Labs Lab 08/11/16 0417 08/11/16 0735 08/11/16 1155 08/11/16 1601 08/12/16 0432 08/12/16 0804  GLUCAP 139* 133* 147* 191* 127* 139*     Imaging Dg Chest Port 1 View  Result Date: 08/11/2016 CLINICAL DATA:  Chest tube placement, substance abuse EXAM: PORTABLE CHEST 1 VIEW COMPARISON:  08/10/2016 chest CT and CXR FINDINGS: New right-sided chest tube is noted with tip projecting adjacent to the right posterior sixth rib. Endotracheal, gastric and right subclavian central line tips are satisfactory in position. The tip of the gastric tube is excluded on current exam with tip and side-port that appear to be below the level of the left hemidiaphragm. Interval decrease in right effusion. Bibasilar atelectasis is noted. Bullous emphysematous changes are noted about the mediastinum. New right-sided subcutaneous emphysema from presumed chest tube placement. IMPRESSION: 1. New right-sided chest tube is noted with tip projecting along the upper margin of the posterior right sixth rib. Interval decrease in right-sided pleural effusion, now small. 2. No pneumothorax.  Bibasilar atelectasis. 3. COPD. 4. Satisfactory support line and tube positions. Electronically Signed   By: Ashley Royalty M.D.   On: 08/11/2016 18:03    STUDIES:  RENAL U/S 7/13:   IMPRESSION: 1. Mild right pelvicaliectasis, with ureteral jet noted in the urinary bladder excluding significant ureteral obstruction. 2. Small left renal cyst. 3. Pleural effusions and abdominal ascites. LP 7/13:  Tube 1 - WBC 51 (62% lymph, 29% lymph, 9% mono), RBC 555, Protein 88 , & Glucose 52. / Tube 4 - WBC 180 (58% neutro, 31% lymph, 11% monocytes) & RBC 36. TTE 7/14:  LV normal in size with EF 55-60%. No regional wall motion abnormalities & grade 1 diastolic dysfunction. LA upper limits of normal in size & RA normal in size. RV normal in size and function. No aortic stenosis or regurgitation. Aortic root normal in size. No mitral stenosis or regurgitation. No pulmonic stenosis or regurgitation with poorly visualized valve. No tricuspid regurgitation. Medium sized mobile vegetation noted on  tricuspid valve. No pericardial effusion. CT HEAD W/O 7/16:  Progressive atrophy since 2011.  No acute intracranial findings. No abnormal enhancement or vasogenic edema to suggest septic emboli to the brain. VENOUS DUPLEX BILATERAL LOWER EXTREMITY 7/17:  No DVT or SVT.  CTA CHEST 7/24:   IMPRESSION: 1. Negative for a pulmonary embolism. 2. Extensive pleural-parenchymal lung disease bilaterally. Large right pleural effusion and moderate-sized left pleural effusion. Pleural-based nodular opacities could represent an infectious etiology but also raise concern for a neoplastic process. Concern for a cavitary lesion and necrotic tissue in the right lung. Consider sampling of the pleural fluid. Recommend follow-up CT when the pleural fluid and acute process have resolved to exclude neoplastic disease. 3. Severe emphysematous disease. 4. Cirrhosis with evidence for portal hypertension based on the splenomegaly and ascites. 5. 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 Recommend annual imaging followup by CTA or MRA. This recommendation follows 2010 ACCF/AHA/AATS/ACR/ASA/SCA/SCAI/SIR/STS/SVM Guidelines for the Diagnosis and Management  of Patients with Thoracic Aortic Disease. Circulation. 2010; 121: B716-R678 VENOUS DUPLEX LUE 7/24:  Acute DVT involving small portion of left radial vein in mid-forearm.  CT HEAD W/O 7/26:  Normal head CT. Right Pleural Effusion 7/26:  1.5 L hazy yellow fluid by IR. Protein <3.0, Albumin <1.0, Amylase 39, Glucose 72, LDH 72, & WBC 2431 (11% lymph, 82% neutro, & 7% mono). Cytology negative for malignancy.  MRI BRAIN W/O 8/2:  Exam is limited to diffusion only. Negative for acute infarct. No area of restricted diffusion. EEG 8/2:  This EEG is abnormal due to moderate diffuse slowing of the background. Lingula BAL 8/3:  WBC 78 (39% lymph, 31% neutro, 30% mono). Reported DAH.  TTE 8/3:  LV normal in size with EF 55-60%. No regional wall motion abnormality &  grade 1 diastolic dysfunction. LA & RA normal in size. RV normal in size and function. No aortic stenosis or regurgitation. Aortic root normal in size. No mitral stenosis or regurgitation. No pulmonic stenosis. Trivial regurgitation. Trivial pericardial effusion as well. Notably no vegetations were seen on this exam. Right Pleural Effusion 8/5:  WBC 616 (1% lymph, 1% eos, & 98% neutro). PORT CXR 8/5:  Personally reviewed by me. Right-sided predominant opacities. Right-sided chest tube in place. Endotracheal tube in good position. Enteric feeding tube in good position. Right upper extremity PICC line in good position.  MICROBIOLOGY: Blood Cultures x2 7/12:  2/2 Bottles MSSA & 1/2 Bottles Pseudomonas aeruginosa  CSF Culture 7/13:  Negative  MRSA PCR 7/13:  Negative HIV 7/13:  Negative Respiratory Viral Panel PCR 7/13:  Negative  Urine Culture 7/13:  Multiple Species Present Blood Cultures x2 7/14:  1/2 Bottles MSSA Blood Culture x1 7/15:  Negative  MRSA PCR 7/17:  Negative  Urine Culture 7/17:  Negative  Urine Streptococcal Antigen 7/17:  Negative Urine Legionella Antigen 7/17:  Negative  Right Pleural Fluid Culture 7/26 >>> Bacteria Negative / AFB pending / Fungus pending Lingula BAL 8/3:  Candida tropicalis  Blood Cultures x2 8/3 >>> Right Pleural Culture 8/5 >>> Blood Culture x2 8/6 >>>  ANTIBIOTICS: Ceftriaxone 7/13 (x1 dose) Zosyn 7/13 (x1 dose) Nafcillin 7/13 - 7/16 Cefepime 7/13 - 8/6 Tressie Ellis 8/6 >>> (plan to continue through 8/25 per ID recs) Vancomycin 7/13 (x1 dose); restarted 8/3 >>>  SIGNIFICANT EVENTS: 7/17 - Transfer to ICU for precedex infusion 7/21 - Precedex off  7/25 - PCCM signed off 8/02 - Patient hypothermic & more agitated 8/03 - acute tachypnea w/ "guppy breathing" >> emergently intubated w/ bronchoscopy showing "mild diffuse alveolar hemorrhage" 8/05 - Right chest tube inserted w/ 350 mL serous fluid. Bicarb gtt started for acidosis 8/06 - Switched off  Cefepime to [place] given potential for worsening encephalopathy   LINES/TUBES: OETT 8/3 >>> R CHEST TUBE 8/5 >>> RUE DL PICC 7/27 >>> Foley >>>  DISCUSSION:  46 y.o. male w/ MSSA and Pseudomonas bacteremia w/ tricuspid valve vegetation. Patient with concordant transudative effusion. Rising Procalcitonin concerning for possible untreated infectious process. Question possible component of cefepime to patient's underlying encephalopathy.  ASSESSMENT / PLAN:  PULMONARY A: Acute hypoxic respiratory failure - Multifactorial.   Cavitary Pneumonia - Likely due to tricuspid valve endocarditis.  Hemoptysis/DAH - Likely due to cavitary pneumonia.  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 Transudative Right Pleural Effusion - S/P Chest tube placement.   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 LUE Radial Vein DVT - Seen on duplex 7/24. No PE on CTA 7/24.  P: Monitoring vitals per unit protocol Continuous Telemetry Monitoring Holding systemic anticoagulation given hemoptysis/DAH Previously refused TEE  RENAL A:   Recurrent Acute Renal Failure - Oliguric. Worsening.  Metabolic Acidosis - Likely due to renal failure.   P:   Monitoring UOP with Foley Checking Complete Abdominal U/S Trending electrolytes & renal function daily.  Avoiding nephrotoxic agents Continuing Bicarb gtt @ 75cc/hr Holding on Nephrology Consult/RRT for now  GASTROINTESTINAL A:   Moderate Protein-Calorie Malnutrition Hypoalbuminemia  P:   NPO Restarting tube feedings D/C Colace Checking Complete Abdominal U/S Repeat LFTs in AM  HEMATOLOGIC A:   Anemia - Multifactorial. Thrombocytopenia - Mild.  LUE Radial Vein DVT  P:  SCD Trending cell counts daily w/ CBC Transfusing for Hgb <7.0 Holding systemic  anticoagulation  INFECTIOUS A:   Tricuspid Valve Endocarditis MSSA & Pseudomonas Bacteremia/Endocarditis Sepsis - Multiple etiologies.  Cavitary Pneumonia H/O Hepatitis C Viral Infection Right Lower Extremity Cellulitis - Resolved.   P:   Switching from Cefepime to [place] Continuing Vancomycin Awaiting culture Results ID signed off 8/2 w/ plans for Cefepime through 8/25.  ENDOCRINE A:   Hypoglycemia - Resolved w/ Dextrose infusion.  P:   Continuing D5 LR & D5 Bicarb infusions Accu-Checks q4hr w/ MD notification parameters   NEUROLOGIC A:   Acute Encephalopathy - Multifactorial. No documented h/o EtOH use. IV Drug Use.  Sedation on Ventilator  P:   PAD protocol RAS goal 0 to -1 Fentanyl gtt & IV prn pain Versed IV prn sedation Starting Thiamine IV daily Switching from Cefepime & Fortaz  FAMILY  - Updates: No family at bedside during my rounds 8/6.  - Inter-disciplinary family meet or Palliative Care meeting due by: 08/16/16   I have spent a total of 46 minutes of critical care time today caring for the patient and reviewing the patient's electronic medical record.   Sonia Baller Ashok Cordia, M.D. [facility] Pulmonary & Critical Care Pager:  [phone] After 3pm or if no response, call [phone] 08/12/2016, 8:57 AM

## 2016-08-12 NOTE — Progress Notes (Addendum)
eLink Physician-Brief Progress Note Patient Name: Johnathan HolesChristopher XXXPallas DOB: 10-19-1970 MRN: [id number]   Date of Service  08/12/2016  HPI/Events of Note  US abd noted for cirrhosis, medical renal disease, ascites, pleural effusions.  Mild CBD dilatation without stone or intraductal abnormalities   eICU Interventions  Recheck LFTs     Intervention Category Evaluation Type: Other  Johnathan [name] 08/12/2016, 10:34 PM

## 2016-08-12 NOTE — Progress Notes (Signed)
Pharmacy Antibiotic Note  Johnathan [name] is a 46 year old male with hx Hep C, IVDA who presented on 7/13 with R-foot redness/swelling with fevers. The patient is noted to have MSSA + Psuedomonas bacteremia with TTE positive for tricuspid valve IE. ID on board. He continues on IV vancomycin and cefepime but renal fxn is worsening. Scr is still trending up (3.94). Pt is afebrile and WBC are WNL.   He is not clearing the vancomycin as the trough on 08/05 was 32 at 1515 and the vanc random at 0400 today was 30.  Plan: Continue cefepime to 1gm IV Q24H which was renally adjusted prior Hold vancomycin  Repeat Vanc random at 0500 on 08/07 to asses clearance F/u renal fxn, C&S, clinical status and LOT  Height: _0  (180.3 cm) Weight: 201 lb (91.2 kg) IBW/kg (Calculated) : 75.3  Temp (24hrs), Avg:96.7 F (35.9 C), Min:94.2 F (34.6 C), Max:98.4 F (36.9 C)   Recent Labs Lab 08/09/16 0322 08/10/16 0424 08/10/16 1700 08/11/16 0245 08/11/16 1515 08/11/16 1800 08/12/16 0400  WBC 7.1 8.0 7.1 6.9  --   --  6.7  CREATININE 2.38* 2.88*  --  3.36*  --  3.77* 3.94*  LATICACIDVEN  --   --   --   --   --  1.0 1.0  VANCOTROUGH  --   --   --   --  32*  --   --   VANCORANDOM  --   --   --   --   --   --  30     Zosyn 7/13 >> 7/13 Vanc 7/13 >> 7/13; 8/3 >> Cefepime 7/13 >> [8/25] Nafcillin 7/13 >> 7/16  7/12 BCx - MSSA + PSA (pan sensitive) 7/13 CSF cx - negative 7/13 RVP- negative 7/13 MRSA PCR - negative 7/13 UCx - negative 7/14 BCx - 1/2 MSSA 7/15 BCx - neg 7/17 Urine- neg 7/26 pleural fluid- gram stain neg, cx ngF 7/26 AFB- neg 7/26 Fungal: in process  8/3 blood cx: pending 8/3 BAL: NGTD 8/3 Blood - [place] PharmD PGY1 Pharmacy Practice Resident 08/12/2016 8:34 AM Pager: [phone]

## 2016-08-13 ENCOUNTER — Inpatient Hospital Stay (HOSPITAL_COMMUNITY): Payer: Medicaid Other

## 2016-08-13 LAB — GLUCOSE, CAPILLARY
GLUCOSE-CAPILLARY: 104 mg/dL — AB (ref 65–99)
GLUCOSE-CAPILLARY: 104 mg/dL — AB (ref 65–99)
GLUCOSE-CAPILLARY: 106 mg/dL — AB (ref 65–99)
GLUCOSE-CAPILLARY: 140 mg/dL — AB (ref 65–99)
Glucose-Capillary: 113 mg/dL — ABNORMAL HIGH (ref 65–99)
Glucose-Capillary: 117 mg/dL — ABNORMAL HIGH (ref 65–99)
Glucose-Capillary: 119 mg/dL — ABNORMAL HIGH (ref 65–99)
Glucose-Capillary: 141 mg/dL — ABNORMAL HIGH (ref 65–99)
Glucose-Capillary: 112 mg/dL — ABNORMAL HIGH (ref 65–99)

## 2016-08-13 LAB — HEPATIC FUNCTION PANEL
ALT: 12 U/L — ABNORMAL LOW (ref 17–63)
AST: 20 U/L (ref 15–41)
Alkaline Phosphatase: 58 U/L (ref 38–126)
BILIRUBIN DIRECT: 0.1 mg/dL (ref 0.1–0.5)
BILIRUBIN INDIRECT: 0.6 mg/dL (ref 0.3–0.9)
BILIRUBIN TOTAL: 0.7 mg/dL (ref 0.3–1.2)
Total Protein: 4.9 g/dL — ABNORMAL LOW (ref 6.5–8.1)

## 2016-08-13 LAB — BASIC METABOLIC PANEL
Anion gap: 8 (ref 5–15)
BUN: 71 mg/dL — ABNORMAL HIGH (ref 6–20)
CALCIUM: 7.6 mg/dL — AB (ref 8.9–10.3)
CHLORIDE: 115 mmol/L — AB (ref 101–111)
CO2: 19 mmol/L — AB (ref 22–32)
CREATININE: 4.27 mg/dL — AB (ref 0.61–1.24)
GFR calc Af Amer: 18 mL/min — ABNORMAL LOW (ref >60)
GFR calc non Af Amer: 15 mL/min — ABNORMAL LOW (ref >60)
GLUCOSE: 106 mg/dL — AB (ref 65–99)
Potassium: 3.3 mmol/L — ABNORMAL LOW (ref 3.5–5.1)
Sodium: 142 mmol/L (ref 135–145)

## 2016-08-13 LAB — CBC WITH DIFFERENTIAL/PLATELET
BASOS ABS: 0.1 10*3/uL (ref 0.0–0.1)
Basophils Relative: 1 %
EOS ABS: 0.4 10*3/uL (ref 0.0–0.7)
Eosinophils Relative: 7 %
HEMATOCRIT: 19.3 % — AB (ref 39.0–52.0)
Hemoglobin: 6.3 g/dL — CL (ref 13.0–17.0)
LYMPHS ABS: 2.3 10*3/uL (ref 0.7–4.0)
Lymphocytes Relative: 39 %
MCH: 28.5 pg (ref 26.0–34.0)
MCHC: 32.6 g/dL (ref 30.0–36.0)
MCV: 87.3 fL (ref 78.0–100.0)
MONOS PCT: 17 %
Monocytes Absolute: 1 10*3/uL (ref 0.1–1.0)
NEUTROS ABS: 2.2 10*3/uL (ref 1.7–7.7)
Neutrophils Relative %: 36 %
Platelets: 100 10*3/uL — ABNORMAL LOW (ref 150–400)
RBC: 2.21 MIL/uL — ABNORMAL LOW (ref 4.22–5.81)
RDW: 21.7 % — AB (ref 11.5–15.5)
WBC: 6 10*3/uL (ref 4.0–10.5)

## 2016-08-13 LAB — RENAL FUNCTION PANEL
Albumin: <1 g/dL — ABNORMAL LOW (ref 3.5–5.0)
Anion gap: 8 (ref 5–15)
Anion gap: 9 (ref 5–15)
BUN: 71 mg/dL — AB (ref 6–20)
BUN: 72 mg/dL — AB (ref 6–20)
CHLORIDE: 112 mmol/L — AB (ref 101–111)
CO2: 17 mmol/L — ABNORMAL LOW (ref 22–32)
CO2: 19 mmol/L — ABNORMAL LOW (ref 22–32)
CREATININE: 4.43 mg/dL — AB (ref 0.61–1.24)
Calcium: 7.9 mg/dL — ABNORMAL LOW (ref 8.9–10.3)
Calcium: 7.9 mg/dL — ABNORMAL LOW (ref 8.9–10.3)
Chloride: 115 mmol/L — ABNORMAL HIGH (ref 101–111)
Creatinine, Ser: 4.47 mg/dL — ABNORMAL HIGH (ref 0.61–1.24)
GFR calc Af Amer: 17 mL/min — ABNORMAL LOW (ref >60)
GFR calc Af Amer: 17 mL/min — ABNORMAL LOW (ref >60)
GFR, EST NON AFRICAN AMERICAN: 15 mL/min — AB (ref >60)
GFR, EST NON AFRICAN AMERICAN: 15 mL/min — AB (ref >60)
Glucose, Bld: 120 mg/dL — ABNORMAL HIGH (ref 65–99)
Glucose, Bld: 141 mg/dL — ABNORMAL HIGH (ref 65–99)
PHOSPHORUS: 1.8 mg/dL — AB (ref 2.5–4.6)
POTASSIUM: 3.3 mmol/L — AB (ref 3.5–5.1)
Phosphorus: 2 mg/dL — ABNORMAL LOW (ref 2.5–4.6)
Potassium: 3.1 mmol/L — ABNORMAL LOW (ref 3.5–5.1)
SODIUM: 140 mmol/L (ref 135–145)
Sodium: 140 mmol/L (ref 135–145)

## 2016-08-13 LAB — MAGNESIUM: MAGNESIUM: 1.7 mg/dL (ref 1.7–2.4)

## 2016-08-13 LAB — PROTIME-INR
INR: 1.41
PROTHROMBIN TIME: 17.3 s — AB (ref 11.4–15.2)

## 2016-08-13 LAB — APTT: aPTT: 107 seconds — ABNORMAL HIGH (ref 24–36)

## 2016-08-13 LAB — HEMOGLOBIN AND HEMATOCRIT, BLOOD
HEMATOCRIT: 23 % — AB (ref 39.0–52.0)
HEMOGLOBIN: 7.6 g/dL — AB (ref 13.0–17.0)

## 2016-08-13 LAB — VANCOMYCIN, RANDOM: VANCOMYCIN RM: 24

## 2016-08-13 LAB — PREPARE RBC (CROSSMATCH)

## 2016-08-13 LAB — ABO/RH: ABO/RH(D): A POS

## 2016-08-13 MED ORDER — LACTULOSE ENEMA
300.0000 mL | Freq: Every day | ORAL | Status: DC
  Administered 2016-08-13 – 2016-08-19 (×7): 300 mL via RECTAL
  Filled 2016-08-13 (×9): qty 300

## 2016-08-13 MED ORDER — SODIUM CHLORIDE 0.9 % IV SOLN: Freq: Once | INTRAVENOUS | Status: AC

## 2016-08-13 MED ORDER — POTASSIUM PHOSPHATES 15 MMOLE/5ML IV SOLN
20.0000 mmol | Freq: Once | INTRAVENOUS | Status: AC
  Administered 2016-08-13: 20 mmol via INTRAVENOUS
  Filled 2016-08-13: qty 6.67

## 2016-08-13 NOTE — Progress Notes (Signed)
eLink Physician-Brief Progress Note Patient Name: Johnathan HolesChristopher XXXPallas DOB: 16-Aug-1970 MRN: [id number]   Date of Service  08/13/2016  HPI/Events of Note  Hb 6.3  eICU Interventions  Will transfuse 1 unit PRBC     Intervention Category Major Interventions: Other:  Sherly Brodbeck 08/13/2016, 5:43 AM

## 2016-08-13 NOTE — Progress Notes (Signed)
[facility] PULMONARY / CRITICAL CARE MEDICINE CONSULT   Name: Johnathan [name] MRN: [id number] DOB: [birth year]    ADMISSION DATE:  07/19/2016 CONSULTATION DATE:  07/23/2016  REFERRING MD:  Ree Kida  HISTORY OF PRESENT ILLNESS:   Johnathan [name] a 46 y.o.male,w Hep C?, IVDA, (recently injected opana) apparently c/o fever intermittently for  1 week, and redness over the dorsum of the right foot and slight swelling. Pt notes injecting Opana about 2 days ago and cellulitis in right foot noted on admission 7/13 with labs showing EKG and abnormal LFTs with hyponatremia 123 and right lower lobe infiltrate on chest x-ray. Found to have MSSA bacteremia, possible meningitis, and LE cellulitis.  Echocardiogram Showed EF 84-16%, grade 1 diastolic dysfunction, tricuspid valve with mobile vegetation. On 7/17 He developed acute encephalopathy with agitation, becoming belligerent and combative. He denies ETOH abuse, but admitted to IVDA. He was placed on CIWA protocol, however, scores remained 19-23, patient was not responsive  to ativan, transferred to ICU for precedex gtt   SUBJECTIVE: Still minimal urine output. Patient with anemia this morning & transfusion ordered.   REVIEW OF SYSTEMS:  Unable to obtain given intubation & sedation.   VITAL SIGNS: BP 132/75 (BP Location: Left Arm)   Pulse 83   Temp 97.7 F (36.5 C) (Oral)   Resp (!) 23   Ht _0  (1.803 m)   Wt 194 lb (88 kg)   SpO2 100%   BMI 27.06 kg/m   HEMODYNAMICS:    VENTILATOR SETTINGS: Vent Mode: PRVC FiO2 (%):  [40 %] 40 % Set Rate:  [26 bmp] 26 bmp Vt Set:  [600 mL] 600 mL PEEP:  [5 cmH20] 5 cmH20 Plateau Pressure:  [22 cmH20-25 cmH20] 22 cmH20  INTAKE / OUTPUT: I/O last 3 completed shifts: In: 7825.3 [I.V.:6574; NG/GT:1151.3; IV Piggyback:100] Out: 3315 [Urine:105; Stool:200; Chest Tube:3010]  PHYSICAL EXAMINATION: General:  No family present. No distress.  Integument:  Warm. Dry. No rash.  Extremities:  No  cyanosis or clubbing.  HEENT:  No sclera injection. Endotracheal tube in place. Moist mucus membranes. Cardiovascular:  Regular rate. No JVD appreciated. Pulmonary:  Clear breath sounds. Symmetric chest wall rise on ventilator.  Abdomen: Soft. Hyperactive bowel sounds. Rectal tube in place. Scrotal edema. Neurological: Spontaneously moving extremities. Not following commands. Pupils symmetric.   LABS:  BMET  Recent Labs Lab 08/11/16 1800 08/12/16 0400 08/13/16 0420  NA 140 141 140  K 4.0 3.9 3.1*  CL 122* 118* 115*  CO2 14* 16* 17*  BUN 65* 67* 71*  CREATININE 3.77* 3.94* 4.43*  GLUCOSE 154* 143* 120*    Electrolytes  Recent Labs Lab 08/10/16 0424 08/10/16 1700  08/11/16 1800 08/12/16 0400 08/13/16 0420  CALCIUM 8.4*  --   < > 8.4* 8.5* 7.9*  MG 1.7 1.7  --   --  1.5* 1.7  PHOS 3.3 3.7  --   --   --  1.8*  < > = values in this interval not displayed.  CBC  Recent Labs Lab 08/11/16 0245 08/12/16 0400 08/13/16 0420  WBC 6.9 6.7 6.0  HGB 7.4* 7.7* 6.3*  HCT 22.9* 23.8* 19.3*  PLT 122* 120* 100*    Coag's  Recent Labs Lab 08/10/16 1700 08/13/16 0420  APTT 136* 107*  INR 1.56 1.41    Sepsis Markers  Recent Labs Lab 08/09/16 1142 08/10/16 0424 08/11/16 0245 08/11/16 1800 08/12/16 0400  LATICACIDVEN  --   --   --  1.0 1.0  PROCALCITON 0.72 0.86 1.14  --   --  ABG  Recent Labs Lab 08/09/16 1350 08/10/16 0516  PHART 7.205* 7.286*  PCO2ART 34.6 25.7*  PO2ART 73.0* 133*    Liver Enzymes  Recent Labs Lab 08/10/16 0424 08/13/16 0420  AST 20 20  ALT 11* 12*  ALKPHOS 58 58  BILITOT 0.8 0.7  ALBUMIN 1.1* <1.0*  <1.0*    Cardiac Enzymes  Recent Labs Lab 08/09/16 1142 08/09/16 1742 08/10/16 0023  TROPONINI <0.03 <0.03 <0.03    Glucose  Recent Labs Lab 08/12/16 0804 08/12/16 1109 08/12/16 1457 08/12/16 2004 08/12/16 2329 08/13/16 0353  GLUCAP 139* 116* 109* 103* 105* 113*    Imaging US Abdomen Port  Result  Date: 08/12/2016 CLINICAL DATA:  Acute on chronic renal failure. History of cirrhosis. EXAM: ABDOMEN ULTRASOUND COMPLETE COMPARISON:  Chest CT 08/10/2016 FINDINGS: Gallbladder: No gallstones or wall thickening visualized. No sonographic Murphy sign noted by sonographer. Common bile duct: Diameter: 8.2 mm Liver: Cirrhotic changes with a nodular contour or and heterogeneous echogenicity. No obvious hepatic lesion or intrahepatic biliary dilatation. IVC: Normal caliber. Pancreas: Poorly visualized. Spleen: Upper limits of normal in size.  No focal lesions. Right Kidney: Length: 14.5 cm. Increased echogenicity suggesting medical renal disease. Normal renal cortical thickness without focal renal lesion. No hydronephrosis. Left Kidney: Length: 13.8 cm. Increased echogenicity but normal renal cortical thickness. No hydronephrosis. Abdominal aorta: Normal caliber Other findings: Large volume ascites and bilateral pleural effusions. IMPRESSION: 1. Stable cirrhotic changes involving the liver without definite focal hepatic lesion. 2. Mild common bile duct dilatation without obvious common bile duct stone or intrahepatic biliary dilatation. 3. Poor visualization of the pancreas. 4. Increased echogenicity of both kidneys suggesting medical renal disease. No hydronephrosis. 5. Large volume ascites and bilateral pleural effusions. Electronically Signed   By: Marijo Sanes M.D.   On: 08/12/2016 16:30   Dg Chest Portable 1 View  Result Date: 08/13/2016 CLINICAL DATA:  Respiratory failure EXAM: PORTABLE CHEST 1 VIEW COMPARISON:  08/11/2016 FINDINGS: Tubular devices are stable. No pneumothorax. Low volumes. Patchy bilateral pulmonary opacities stable. Right chest soft tissue emphysema resolved. IMPRESSION: Bilateral patchy airspace opacities are stable.  No pneumothorax. Electronically Signed   By: Marybelle Killings M.D.   On: 08/13/2016 07:59    STUDIES:  RENAL U/S 7/13:   IMPRESSION: 1. Mild right pelvicaliectasis, with ureteral  jet noted in the urinary bladder excluding significant ureteral obstruction. 2. Small left renal cyst. 3. Pleural effusions and abdominal ascites. LP 7/13:  Tube 1 - WBC 51 (62% lymph, 29% lymph, 9% mono), RBC 555, Protein 88 , & Glucose 52. / Tube 4 - WBC 180 (58% neutro, 31% lymph, 11% monocytes) & RBC 36. TTE 7/14:  LV normal in size with EF 55-60%. No regional wall motion abnormalities & grade 1 diastolic dysfunction. LA upper limits of normal in size & RA normal in size. RV normal in size and function. No aortic stenosis or regurgitation. Aortic root normal in size. No mitral stenosis or regurgitation. No pulmonic stenosis or regurgitation with poorly visualized valve. No tricuspid regurgitation. Medium sized mobile vegetation noted on tricuspid valve. No pericardial effusion. CT HEAD W/O 7/16:  Progressive atrophy since 2011.  No acute intracranial findings. No abnormal enhancement or vasogenic edema to suggest septic emboli to the brain. VENOUS DUPLEX BILATERAL LOWER EXTREMITY 7/17:  No DVT or SVT.  CTA CHEST 7/24:   IMPRESSION: 1. Negative for a pulmonary embolism. 2. Extensive pleural-parenchymal lung disease bilaterally. Large right pleural effusion and moderate-sized left pleural effusion. Pleural-based nodular opacities could  represent an infectious etiology but also raise concern for a neoplastic process. Concern for a cavitary lesion and necrotic tissue in the right lung. Consider sampling of the pleural fluid. Recommend follow-up CT when the pleural fluid and acute process have resolved to exclude neoplastic disease. 3. Severe emphysematous disease. 4. Cirrhosis with evidence for portal hypertension based on the splenomegaly and ascites. 5. 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 Recommend annual imaging followup by CTA or MRA. This recommendation follows 2010 ACCF/AHA/AATS/ACR/ASA/SCA/SCAI/SIR/STS/SVM Guidelines for the Diagnosis and Management of Patients with  Thoracic Aortic Disease. Circulation. 2010; 121: H607-P710 VENOUS DUPLEX LUE 7/24:  Acute DVT involving small portion of left radial vein in mid-forearm.  CT HEAD W/O 7/26:  Normal head CT. Right Pleural Effusion 7/26:  1.5 L hazy yellow fluid by IR. Protein <3.0, Albumin <1.0, Amylase 39, Glucose 72, LDH 72, & WBC 2431 (11% lymph, 82% neutro, & 7% mono). Cytology negative for malignancy.  MRI BRAIN W/O 8/2:  Exam is limited to diffusion only. Negative for acute infarct. No area of restricted diffusion. EEG 8/2:  This EEG is abnormal due to moderate diffuse slowing of the background. Lingula BAL 8/3:  WBC 78 (39% lymph, 31% neutro, 30% mono). Reported DAH.  TTE 8/3:  LV normal in size with EF 55-60%. No regional wall motion abnormality & grade 1 diastolic dysfunction. LA & RA normal in size. RV normal in size and function. No aortic stenosis or regurgitation. Aortic root normal in size. No mitral stenosis or regurgitation. No pulmonic stenosis. Trivial regurgitation. Trivial pericardial effusion as well. Notably no vegetations were seen on this exam. Right Pleural Effusion 8/5:  WBC 616 (1% lymph, 1% eos, & 98% neutro). PORT CXR 8/5:  Previously reviewed by me. Right-sided predominant opacities. Right-sided chest tube in place. Endotracheal tube in good position. Enteric feeding tube in good position. Right upper extremity PICC line in good position. Complete Abdominal U/S 8/6: IMPRESSION: 1. Stable cirrhotic changes involving the liver without definite focal hepatic lesion. 2. Mild common bile duct dilatation without obvious common bile duct stone or intrahepatic biliary dilatation. 3. Poor visualization of the pancreas. 4. Increased echogenicity of both kidneys suggesting medical renal disease. No hydronephrosis. 5. Large volume ascites and bilateral pleural effusions. Port CXR 8/7:  Hazy opacities bilateral lower lung zones. Endotracheal tube & enteric feeding tube in good position. Right chest  tube in good position.   MICROBIOLOGY: Blood Cultures x2 7/12:  2/2 Bottles MSSA & 1/2 Bottles Pseudomonas aeruginosa  CSF Culture 7/13:  Negative  MRSA PCR 7/13:  Negative HIV 7/13:  Negative Respiratory Viral Panel PCR 7/13:  Negative  Urine Culture 7/13:  Multiple Species Present Blood Cultures x2 7/14:  1/2 Bottles MSSA Blood Culture x1 7/15:  Negative  MRSA PCR 7/17:  Negative  Urine Culture 7/17:  Negative  Urine Streptococcal Antigen 7/17:  Negative Urine Legionella Antigen 7/17:  Negative  Right Pleural Fluid Culture 7/26 >>> Bacteria Negative / AFB pending / Fungus pending Lingula BAL 8/3:  Candida tropicalis  Blood Cultures x2 8/3 >>> Right Pleural Culture 8/5 >>> Blood Culture x2 8/6 >>>  ANTIBIOTICS: Ceftriaxone 7/13 (x1 dose) Zosyn 7/13 (x1 dose) Nafcillin 7/13 - 7/16 Cefepime 7/13 - 8/6 Vancomycin 7/13 (x1 dose); restarted 8/3 - 8/7 Tressie Ellis 8/6 >>> (plan to continue through 8/25 per ID recs)  SIGNIFICANT EVENTS: 7/17 - Transfer to ICU for precedex infusion 7/21 - Precedex off  7/25 - PCCM signed off 8/02 - Patient  hypothermic & more agitated 8/03 - acute tachypnea w/ "guppy breathing" >> emergently intubated w/ bronchoscopy showing "mild diffuse alveolar hemorrhage" 8/05 - Right chest tube inserted w/ 350 mL serous fluid. Bicarb gtt started for acidosis 8/06 - Switched off Cefepime to [place] given potential for worsening encephalopathy   LINES/TUBES: OETT 8/3 >>> R CHEST TUBE 8/5 >>> RUE DL PICC 7/27 >>> Foley >>>  DISCUSSION:  46 y.o. male w/ MSSA and Pseudomonas bacteremia w/ tricuspid valve vegetation. Patient with concordant transudative effusion. Rising Procalcitonin concerning for possible untreated infectious process. Question possible component of cefepime to patient's underlying encephalopathy.  ASSESSMENT / PLAN:  PULMONARY A: Acute hypoxic respiratory failure - Multifactorial.   Cavitary Pneumonia - Likely due to tricuspid valve endocarditis.   Hemoptysis/DAH - Likely due to cavitary pneumonia.  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 Transudative Right Pleural Effusion - S/P Chest tube placement. 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 LUE Radial Vein DVT - Seen on duplex 7/24. No PE on CTA 7/24.  P: Continuing to monitor vitals per protocol Continuous telemetry monitoring Previously refused transesophageal echocardiogram Holding systemic anticoagulation given hemoptysis/diffuse alveolar hemorrhage  RENAL A:   Recurrent Acute Renal Failure - Oliguric. Worsening.  Metabolic Acidosis - Likely due to renal failure.  Hypokalemia - Replacing IV. Hypophosphatemia - Replacing IV.  P:   KPhos 83mol IV x1 Repeat Renal Panel @ 1600 today  Checking bladder pressure (15) Holding on nephrology consult Continuing bicarbonate drip at 75 mL per hour  GASTROINTESTINAL A:   Moderate Protein-Calorie Malnutrition Hypoalbuminemia Liver Cirrhosis w/ Ascites - Likely due to Hepatitis C.   P:   Lactulose enema daily  Continuing nothing by mouth status Continuing tube feedings per dietary recommendations  HEMATOLOGIC A:   Anemia - Multifactorial. Worse today. Likely some element of dilution.  Thrombocytopenia - Mild. Likely due to cirrhosis. LUE Radial Vein DVT Coagulopathy - Likely due to cirrhosis. S/P IV Vitamin K 531m8/6.   P:  1u PRBC ordered for transfusion Repeat Hgb/Hct post transfusion Trending coags daily  Continuing SCDs Holding systemic anticoagulation Trending cell counts daily with CBC Transfusing for Hgb <7.0  INFECTIOUS A:   Tricuspid Valve Endocarditis MSSA & Pseudomonas Bacteremia/Endocarditis Sepsis - Multiple  etiologies.  Cavitary Pneumonia H/O Hepatitis C Viral Infection Right Lower Extremity Cellulitis - Resolved.   P:   D/C Vancomycin today  Continuing Fortaz Awaiting culture results Plan to continue antibiotics through 8/25  ENDOCRINE A:   Hypoglycemia - Resolved w/ Dextrose infusion.  P:   D/C D5 LR Continuing D5 w/ Bicarb at 75cc/hr Continuing Accu-Checks q4hr w/ MD notification parameters   NEUROLOGIC A:   Acute Encephalopathy - Multifactorial. No documented h/o EtOH use. IV Drug Use.  Sedation on Ventilator  P:   Starting Lactulose enema daily Desired RASS:  0 to -1 Weaning Fentanyl gtt & continuing IV prn Versed IV prn sedation Continuing thiamine IV daily   FAMILY  - Updates: No family at bedside at the time of my rounds today.   - Inter-disciplinary family meet or Palliative Care meeting due by: 08/16/16   I have spent a total of 34 minutes of critical care time today caring for the patient and reviewing the patient's  electronic medical record.   Sonia Baller Ashok Cordia, M.D. [facility] Pager:  [phone] After 3pm or if no response, call [phone] 08/13/2016, 9:01 AM

## 2016-08-13 NOTE — Progress Notes (Signed)
RT called by RN because RN could not pass suction catheter down ET tube. RT bag lavaged and suctioned pt. RT suctioned mucus plug out. Pt tolerated well. Catheter is now able to pass down ET tube. RT will cont to monitor.

## 2016-08-13 NOTE — Care Management Note (Signed)
Case Management Note  Patient Details  Name: [name] MRN: [id number] Date of Birth: May 30, 1970  Subjective/Objective:  From home with son, pta indep, presents with cellulits of foot and R leg, on iv abx, he [name], he states he has [employer] [place] and his co pays are $3.50.  NCM called financial counselor and left message to see if patient Medicaid is active.   7/16 1742 Letha Capeeborah Taylor RN BSN -  Found to have MSSA bacteremia, possible meningitis, and LE cellulitis. Plan for echocardiogram and possibly TEE. ID consulted. CT of head today to r/o septic emboli.                     Action/Plan: NCM will cont to follow for dc needs.   Expected Discharge Date:                  Expected Discharge Plan:  Home/Self Care  In-House Referral:  NA  Discharge planning Services  CM Consult  Post Acute Care Choice:  NA Choice offered to:  NA  DME Arranged:  N/A DME Agency:  NA  HH Arranged:  NA HH Agency:  NA  Status of Service:  In process, will continue to follow  If discussed at Long Length of Stay Meetings, dates discussed:    Additional Comments: 08/13/2016  Discussed in LOS 8/7 - pt remains appropriate for continued stay.  Pt is on ventilator, sedation and tube feeds.  CSW following for possible placement due to long term IV antibitoics  07/23/16 Pt transferred to ICU for Precedex drip due to ETOH.   Pt is a current IVDA.  If pt requires long term IV antibiotics - pt will require SNF due to IVDA.  CSW tentatively consulted Cherylann ParrClaxton, Brityn Mastrogiovanni S, RN 08/13/2016, 9:26 AM

## 2016-08-13 NOTE — Progress Notes (Signed)
CRITICAL VALUE ALERT  Critical Value:  hbg-6.3  Date & Time Notied:  08/13/2016 0540  Provider Notified: Dr. Craige CottaSood  Orders Received/Actions taken: Orders to transfuse 1 unit PRBCs

## 2016-08-13 NOTE — Progress Notes (Signed)
Pharmacy Antibiotic Note  Johnathan [name] is a 46 year old male with hx Hep C, IVDA who presented on 7/13 with R-foot redness/swelling with fevers. The patient is noted to have MSSA + Psuedomonas bacteremia with TTE positive for tricuspid valve IE. ID on board. Cefepime was changed to ceftazidime yesterday after discussion with MD who was concerned for cefepime induced encephalitis.  Creatinine is still trending up (4.43 today) and the most recent vancomycin random was 24, from 30 the day prior. He has been afebrile, WBC wnl, and cultures remain negative. After discussion with CCM, vancomycin was discontinued.  Plan: Continue ceftazidime 2gm IV Q24H  Stop vancomycin  F/u renal fxn, C&S, clinical status and LOT   Height: _0  (180.3 cm) Weight: 194 lb (88 kg) IBW/kg (Calculated) : 75.3  Temp (24hrs), Avg:97.9 F (36.6 C), Min:97.7 F (36.5 C), Max:98.1 F (36.7 C)   Recent Labs Lab 08/10/16 0424 08/10/16 1700 08/11/16 0245 08/11/16 1515 08/11/16 1800 08/12/16 0400 08/13/16 0420  WBC 8.0 7.1 6.9  --   --  6.7 6.0  CREATININE 2.88*  --  3.36*  --  3.77* 3.94* 4.43*  LATICACIDVEN  --   --   --   --  1.0 1.0  --   VANCOTROUGH  --   --   --  32*  --   --   --   VANCORANDOM  --   --   --   --   --  30 24     Zosyn 7/13 >> 7/13 Vanc 7/13 >> 7/13; 8/3 >> Cefepime 7/13 >> [8/25] Nafcillin 7/13 >> 7/16  7/12 BCx - MSSA + PSA (pan sensitive) 7/13 CSF cx - negative 7/13 RVP- negative 7/13 MRSA PCR - negative 7/13 UCx - negative 7/14 BCx - 1/2 MSSA 7/15 BCx - neg 7/17 Urine- neg 7/26 pleural fluid- gram stain neg, cx ngF 7/26 AFB- neg 7/26 Fungal:neg 8/3 blood cx: pending 8/3 BAL: NGTD 8/3 Blood - [place] PharmD PGY1 Pharmacy Practice Resident 08/13/2016 8:27 AM Pager: [phone]

## 2016-08-14 LAB — CBC WITH DIFFERENTIAL/PLATELET
BASOS PCT: 1 %
Basophils Absolute: 0.1 10*3/uL (ref 0.0–0.1)
EOS PCT: 8 %
Eosinophils Absolute: 0.5 10*3/uL (ref 0.0–0.7)
HEMATOCRIT: 22.8 % — AB (ref 39.0–52.0)
HEMOGLOBIN: 7.5 g/dL — AB (ref 13.0–17.0)
Lymphocytes Relative: 35 %
Lymphs Abs: 2.1 10*3/uL (ref 0.7–4.0)
MCH: 27.5 pg (ref 26.0–34.0)
MCHC: 32.9 g/dL (ref 30.0–36.0)
MCV: 83.5 fL (ref 78.0–100.0)
MONO ABS: 0.9 10*3/uL (ref 0.1–1.0)
MONOS PCT: 14 %
NEUTROS PCT: 42 %
Neutro Abs: 2.5 10*3/uL (ref 1.7–7.7)
PLATELETS: 96 10*3/uL — AB (ref 150–400)
RBC: 2.73 MIL/uL — ABNORMAL LOW (ref 4.22–5.81)
RDW: 22.2 % — ABNORMAL HIGH (ref 11.5–15.5)
WBC: 6.1 10*3/uL (ref 4.0–10.5)

## 2016-08-14 LAB — GLUCOSE, CAPILLARY
GLUCOSE-CAPILLARY: 105 mg/dL — AB (ref 65–99)
GLUCOSE-CAPILLARY: 111 mg/dL — AB (ref 65–99)
GLUCOSE-CAPILLARY: 114 mg/dL — AB (ref 65–99)
GLUCOSE-CAPILLARY: 118 mg/dL — AB (ref 65–99)
Glucose-Capillary: 99 mg/dL (ref 65–99)
Glucose-Capillary: 108 mg/dL — ABNORMAL HIGH (ref 65–99)
Glucose-Capillary: 112 mg/dL — ABNORMAL HIGH (ref 65–99)

## 2016-08-14 LAB — RENAL FUNCTION PANEL
ANION GAP: 8 (ref 5–15)
Albumin: <1 g/dL — ABNORMAL LOW (ref 3.5–5.0)
BUN: 77 mg/dL — ABNORMAL HIGH (ref 6–20)
CALCIUM: 7.6 mg/dL — AB (ref 8.9–10.3)
CO2: 20 mmol/L — AB (ref 22–32)
Chloride: 113 mmol/L — ABNORMAL HIGH (ref 101–111)
Creatinine, Ser: 4.41 mg/dL — ABNORMAL HIGH (ref 0.61–1.24)
GFR calc Af Amer: 17 mL/min — ABNORMAL LOW (ref >60)
GFR calc non Af Amer: 15 mL/min — ABNORMAL LOW (ref >60)
GLUCOSE: 125 mg/dL — AB (ref 65–99)
Phosphorus: 2.3 mg/dL — ABNORMAL LOW (ref 2.5–4.6)
Potassium: 3.2 mmol/L — ABNORMAL LOW (ref 3.5–5.1)
SODIUM: 141 mmol/L (ref 135–145)

## 2016-08-14 LAB — APTT: APTT: 125 s — AB (ref 24–36)

## 2016-08-14 LAB — TYPE AND SCREEN
ABO/RH(D): A POS
Antibody Screen: NEGATIVE
Unit division: 0

## 2016-08-14 LAB — PROTEIN, URINE, RANDOM: Total Protein, Urine: 94 mg/dL

## 2016-08-14 LAB — POCT I-STAT 3, ART BLOOD GAS (G3+)
ACID-BASE DEFICIT: 2 mmol/L (ref 0.0–2.0)
Bicarbonate: 21.8 mmol/L (ref 20.0–28.0)
O2 SAT: 99 %
PH ART: 7.457 — AB (ref 7.350–7.450)
TCO2: 23 mmol/L (ref 0–100)
pCO2 arterial: 30.8 mmHg — ABNORMAL LOW (ref 32.0–48.0)
pO2, Arterial: 117 mmHg — ABNORMAL HIGH (ref 83.0–108.0)

## 2016-08-14 LAB — CULTURE, BLOOD (ROUTINE X 2)
CULTURE: NO GROWTH
CULTURE: NO GROWTH
SPECIAL REQUESTS: ADEQUATE
Special Requests: ADEQUATE

## 2016-08-14 LAB — PROCALCITONIN: Procalcitonin: 1.25 ng/mL

## 2016-08-14 LAB — MAGNESIUM: Magnesium: 1.4 mg/dL — ABNORMAL LOW (ref 1.7–2.4)

## 2016-08-14 LAB — CREATININE, URINE, RANDOM: CREATININE, URINE: 26.5 mg/dL

## 2016-08-14 LAB — BPAM RBC
Blood Product Expiration Date: 201808282359
ISSUE DATE / TIME: 201808070924
UNIT TYPE AND RH: 6200

## 2016-08-14 LAB — PROTIME-INR
INR: 1.29
Prothrombin Time: 16.2 seconds — ABNORMAL HIGH (ref 11.4–15.2)

## 2016-08-14 LAB — FIBRINOGEN: FIBRINOGEN: 349 mg/dL (ref 210–475)

## 2016-08-14 MED ORDER — POTASSIUM CHLORIDE 20 MEQ/15ML (10%) PO SOLN
40.0000 meq | Freq: Once | ORAL | Status: AC
  Administered 2016-08-14: 40 meq
  Filled 2016-08-14: qty 30

## 2016-08-14 MED ORDER — SODIUM CHLORIDE 0.9% FLUSH
10.0000 mL | INTRAVENOUS | Status: DC | PRN
  Administered 2016-08-28: 20 mL
  Administered 2016-08-30: 10 mL
  Filled 2016-08-14 (×2): qty 40

## 2016-08-14 MED ORDER — FUROSEMIDE 10 MG/ML IJ SOLN
40.0000 mg | Freq: Once | INTRAMUSCULAR | Status: AC
  Administered 2016-08-14: 40 mg via INTRAVENOUS
  Filled 2016-08-14: qty 4

## 2016-08-14 MED ORDER — SODIUM CHLORIDE 0.9% FLUSH
10.0000 mL | Freq: Two times a day (BID) | INTRAVENOUS | Status: DC
Start: 2016-08-14 — End: 2016-09-03
  Administered 2016-08-14 – 2016-08-24 (×16): 10 mL
  Administered 2016-08-25: 20 mL
  Administered 2016-08-25 – 2016-08-31 (×8): 10 mL

## 2016-08-14 MED ORDER — PANTOPRAZOLE SODIUM 40 MG PO PACK
40.0000 mg | PACK | Freq: Every day | ORAL | Status: DC
  Administered 2016-08-14 – 2016-08-19 (×6): 40 mg
  Filled 2016-08-14 (×7): qty 20

## 2016-08-14 MED ORDER — MAGNESIUM SULFATE 2 GM/50ML IV SOLN
2.0000 g | Freq: Once | INTRAVENOUS | Status: AC
  Administered 2016-08-14: 2 g via INTRAVENOUS
  Filled 2016-08-14: qty 50

## 2016-08-14 MED ORDER — ALBUMIN HUMAN 5 % IV SOLN
12.5000 g | Freq: Once | INTRAVENOUS | Status: AC
  Administered 2016-08-14: 12.5 g via INTRAVENOUS
  Filled 2016-08-14: qty 250

## 2016-08-14 NOTE — Progress Notes (Signed)
eLink Physician-Brief Progress Note Patient Name: Johnathan HolesChristopher XXXPallas DOB: July 07, 1970 MRN: [id number]   Date of Service  08/14/2016  HPI/Events of Note  K and Mg low.  eICU Interventions  Will replace both.     Intervention Category Major Interventions: Other:  Athaliah Baumbach 08/14/2016, 5:33 AM

## 2016-08-14 NOTE — Progress Notes (Signed)
[facility] PULMONARY / CRITICAL CARE MEDICINE CONSULT   Name: Johnathan [name] MRN: [id number] DOB: Sep 15, 1970    ADMISSION DATE:  07/19/2016 CONSULTATION DATE:  07/23/2016  REFERRING MD:  Ree Kida  HISTORY OF PRESENT ILLNESS:   [name] a 46 y.o.male,w Hep C?, IVDA, (recently injected opana) apparently c/o fever intermittently for  1 week, and redness over the dorsum of the right foot and slight swelling. Pt notes injecting Opana about 2 days ago and cellulitis in right foot noted on admission 7/13 with labs showing EKG and abnormal LFTs with hyponatremia 123 and right lower lobe infiltrate on chest x-ray. Found to have MSSA bacteremia, possible meningitis, and LE cellulitis.  Echocardiogram Showed EF 57-84%, grade 1 diastolic dysfunction, tricuspid valve with mobile vegetation. On 7/17 He developed acute encephalopathy with agitation, becoming belligerent and combative. He denies ETOH abuse, but admitted to IVDA. He was placed on CIWA protocol, however, scores remained 19-23, patient was not responsive  to ativan, transferred to ICU for precedex gtt   SUBJECTIVE: No acute events overnight. Improving urine output. Continued chest tube output.   REVIEW OF SYSTEMS:  Unable to obtain given intubation & sedation.   VITAL SIGNS: BP (!) 142/79   Pulse 95   Temp (!) 97.5 F (36.4 C) (Oral)   Resp (!) 21   Ht _0  (1.803 m)   Wt 205 lb 0.4 oz (93 kg)   SpO2 100%   BMI 28.60 kg/m   HEMODYNAMICS:    VENTILATOR SETTINGS: Vent Mode: PRVC FiO2 (%):  [40 %] 40 % Set Rate:  [20 bmp-26 bmp] 20 bmp Vt Set:  [600 mL] 600 mL PEEP:  [5 cmH20] 5 cmH20 Plateau Pressure:  [20 ONG29-52 cmH20] 22 cmH20  INTAKE / OUTPUT: I/O last 3 completed shifts: In: 7999.3 [I.V.:4754.3; Blood:315; Other:1000; NG/GT:1880; IV Piggyback:50] Out: 8413 [Urine:340; KGMWN:0272; Chest Tube:2900]  PHYSICAL EXAMINATION: General:  No family at bedside. Sedated. No acute distress. Integument:  Warm. Dry.  Right chest tube in place. Extremities:  No cyanosis or clubbing.  HEENT:  Endotracheal tube in place. No scleral icterus. Moist mucous membranes. Cardiovascular:  Regular rate. Regular rhythm. Anasarca.  Pulmonary:  Distant breath sounds. Symmetric chest wall rise on ventilator. Serous drainage from right chest tube. Abdomen: Soft. Normal bowel sounds. Protuberant. Neurological: Opens eyes to voice. Intermittently nonstick questions.  LABS:  BMET  Recent Labs Lab 08/13/16 1329 08/13/16 1850 08/14/16 0430  NA 140 142 141  K 3.3* 3.3* 3.2*  CL 112* 115* 113*  CO2 19* 19* 20*  BUN 72* 71* 77*  CREATININE 4.47* 4.27* 4.41*  GLUCOSE 141* 106* 125*    Electrolytes  Recent Labs Lab 08/12/16 0400 08/13/16 0420 08/13/16 1329 08/13/16 1850 08/14/16 0430  CALCIUM 8.5* 7.9* 7.9* 7.6* 7.6*  MG 1.5* 1.7  --   --  1.4*  PHOS  --  1.8* 2.0*  --  2.3*    CBC  Recent Labs Lab 08/12/16 0400 08/13/16 0420 08/13/16 1329 08/14/16 0430  WBC 6.7 6.0  --  6.1  HGB 7.7* 6.3* 7.6* 7.5*  HCT 23.8* 19.3* 23.0* 22.8*  PLT 120* 100*  --  96*    Coag's  Recent Labs Lab 08/10/16 1700 08/13/16 0420 08/14/16 0430  APTT 136* 107* 125*  INR 1.56 1.41 1.29    Sepsis Markers  Recent Labs Lab 08/10/16 0424 08/11/16 0245 08/11/16 1800 08/12/16 0400 08/14/16 0430  LATICACIDVEN  --   --  1.0 1.0  --   PROCALCITON 0.86 1.14  --   --  1.25    ABG  Recent Labs Lab 08/09/16 1350 08/10/16 0516 08/14/16 0353  PHART 7.205* 7.286* 7.457*  PCO2ART 34.6 25.7* 30.8*  PO2ART 73.0* 133* 117.0*    Liver Enzymes  Recent Labs Lab 08/10/16 0424 08/13/16 0420 08/13/16 1329 08/14/16 0430  AST 20 20  --   --   ALT 11* 12*  --   --   ALKPHOS 58 58  --   --   BILITOT 0.8 0.7  --   --   ALBUMIN 1.1* <1.0*  <1.0* <1.0* <1.0*    Cardiac Enzymes  Recent Labs Lab 08/09/16 1142 08/09/16 1742 08/10/16 0023  TROPONINI <0.03 <0.03 <0.03    Glucose  Recent Labs Lab  08/13/16 1150 08/13/16 1531 08/13/16 2048 08/14/16 0030 08/14/16 0423 08/14/16 0714  GLUCAP 104* 106* 112* 118* 111* 99    Imaging No results found.  STUDIES:  RENAL U/S 7/13:   IMPRESSION: 1. Mild right pelvicaliectasis, with ureteral jet noted in the urinary bladder excluding significant ureteral obstruction. 2. Small left renal cyst. 3. Pleural effusions and abdominal ascites. LP 7/13:  Tube 1 - WBC 51 (62% lymph, 29% lymph, 9% mono), RBC 555, Protein 88 , & Glucose 52. / Tube 4 - WBC 180 (58% neutro, 31% lymph, 11% monocytes) & RBC 36. TTE 7/14:  LV normal in size with EF 55-60%. No regional wall motion abnormalities & grade 1 diastolic dysfunction. LA upper limits of normal in size & RA normal in size. RV normal in size and function. No aortic stenosis or regurgitation. Aortic root normal in size. No mitral stenosis or regurgitation. No pulmonic stenosis or regurgitation with poorly visualized valve. No tricuspid regurgitation. Medium sized mobile vegetation noted on tricuspid valve. No pericardial effusion. CT HEAD W/O 7/16:  Progressive atrophy since 2011.  No acute intracranial findings. No abnormal enhancement or vasogenic edema to suggest septic emboli to the brain. VENOUS DUPLEX BILATERAL LOWER EXTREMITY 7/17:  No DVT or SVT.  CTA CHEST 7/24:   IMPRESSION: 1. Negative for a pulmonary embolism. 2. Extensive pleural-parenchymal lung disease bilaterally. Large right pleural effusion and moderate-sized left pleural effusion. Pleural-based nodular opacities could represent an infectious etiology but also raise concern for a neoplastic process. Concern for a cavitary lesion and necrotic tissue in the right lung. Consider sampling of the pleural fluid. Recommend follow-up CT when the pleural fluid and acute process have resolved to exclude neoplastic disease. 3. Severe emphysematous disease. 4. Cirrhosis with evidence for portal hypertension based on the splenomegaly and  ascites. 5. 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 Recommend annual imaging followup by CTA or MRA. This recommendation follows 2010 ACCF/AHA/AATS/ACR/ASA/SCA/SCAI/SIR/STS/SVM Guidelines for the Diagnosis and Management of Patients with Thoracic Aortic Disease. Circulation. 2010; 121: L465-K354 VENOUS DUPLEX LUE 7/24:  Acute DVT involving small portion of left radial vein in mid-forearm.  CT HEAD W/O 7/26:  Normal head CT. Right Pleural Effusion 7/26:  1.5 L hazy yellow fluid by IR. Protein <3.0, Albumin <1.0, Amylase 39, Glucose 72, LDH 72, & WBC 2431 (11% lymph, 82% neutro, & 7% mono). Cytology negative for malignancy.  MRI BRAIN W/O 8/2:  Exam is limited to diffusion only. Negative for acute infarct. No area of restricted diffusion. EEG 8/2:  This EEG is abnormal due to moderate diffuse slowing of the background. Lingula BAL 8/3:  WBC 78 (39% lymph, 31% neutro, 30% mono). Reported DAH.  TTE 8/3:  LV normal in size with EF 55-60%. No regional wall  motion abnormality & grade 1 diastolic dysfunction. LA & RA normal in size. RV normal in size and function. No aortic stenosis or regurgitation. Aortic root normal in size. No mitral stenosis or regurgitation. No pulmonic stenosis. Trivial regurgitation. Trivial pericardial effusion as well. Notably no vegetations were seen on this exam. Right Pleural Effusion 8/5:  WBC 616 (1% lymph, 1% eos, & 98% neutro). PORT CXR 8/5:  Previously reviewed by me. Right-sided predominant opacities. Right-sided chest tube in place. Endotracheal tube in good position. Enteric feeding tube in good position. Right upper extremity PICC line in good position. Complete Abdominal U/S 8/6: IMPRESSION: 1. Stable cirrhotic changes involving the liver without definite focal hepatic lesion. 2. Mild common bile duct dilatation without obvious common bile duct stone or intrahepatic biliary dilatation. 3. Poor visualization of the pancreas. 4. Increased  echogenicity of both kidneys suggesting medical renal disease. No hydronephrosis. 5. Large volume ascites and bilateral pleural effusions. Port CXR 8/7:  Previously reviewed by me. Hazy opacities bilateral lower lung zones. Endotracheal tube & enteric feeding tube in good position. Right chest tube in good position.   MICROBIOLOGY: Blood Cultures x2 7/12:  2/2 Bottles MSSA & 1/2 Bottles Pseudomonas aeruginosa  CSF Culture 7/13:  Negative  MRSA PCR 7/13:  Negative HIV 7/13:  Negative Respiratory Viral Panel PCR 7/13:  Negative  Urine Culture 7/13:  Multiple Species Present Blood Cultures x2 7/14:  1/2 Bottles MSSA Blood Culture x1 7/15:  Negative  MRSA PCR 7/17:  Negative  Urine Culture 7/17:  Negative  Urine Streptococcal Antigen 7/17:  Negative Urine Legionella Antigen 7/17:  Negative  Right Pleural Fluid Culture 7/26 >>> Bacteria Negative / AFB pending / Fungus pending Lingula BAL 8/3:  Candida tropicalis  Blood Cultures x2 8/3 >>> Right Pleural Culture 8/5 >>> Blood Culture x2 8/6 >>>  ANTIBIOTICS: Ceftriaxone 7/13 (x1 dose) Zosyn 7/13 (x1 dose) Nafcillin 7/13 - 7/16 Cefepime 7/13 - 8/6 Vancomycin 7/13 (x1 dose); restarted 8/3 - 8/7 Tressie Ellis 8/6 >>> (plan to continue through 8/25 per ID recs)  SIGNIFICANT EVENTS: 7/17 - Transfer to ICU for precedex infusion 7/21 - Precedex off  7/25 - PCCM signed off 8/02 - Patient hypothermic & more agitated 8/03 - acute tachypnea w/ "guppy breathing" >> emergently intubated w/ bronchoscopy showing "mild diffuse alveolar hemorrhage" 8/05 - Right chest tube inserted w/ 350 mL serous fluid. Bicarb gtt started for acidosis 8/06 - Switched off Cefepime to [place] given potential for worsening encephalopathy  8/08 - Albumin w/ Lasix today. Improving UOP.   LINES/TUBES: OETT 8/3 >>> R CHEST TUBE 8/5 >>> RUE DL PICC 7/27 >>> Foley >>>  ASSESSMENT / PLAN:  PULMONARY A: Acute hypoxic respiratory failure - Multifactorial.   Cavitary  Pneumonia - Likely due to tricuspid valve endocarditis.  Hemoptysis/DAH - Likely due to cavitary pneumonia. Improving. 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 Transudative Right Pleural Effusion - S/P Chest tube placement. 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 LUE Radial Vein DVT - Seen on duplex 7/24. No PE on CTA 7/24.  P: Monitoring vitals per unit protocol Continuous telemetry monitoring Previously refused transesophageal echocardiogram Holding systemic anticoagulation given hemoptysis/diffuse alveolar hemorrhage &  thrombocytopenia  RENAL A:   Recurrent Acute Renal Failure:  Improving urine output.  Metabolic Acidosis:  Improving.  Hypokalemia:  Replaced. Hypomagnesemia:  Replaced.  Hypophosphatemia :  Resolved.  P:   Magnesium Sulfate 2gm IV KCl 40 mEq via tube Albumin 12gm followed by Lasix 53m IV today Holding on Nephrology consult for now Trending UOP with foley Monitoring electrolytes & renal function twice daily  Spot Urine Protein & Creatinine today Decreasing Bicarb gtt to 50cc/hr   GASTROINTESTINAL A:   Moderate Protein-Calorie Malnutrition Hypoalbuminemia Liver Cirrhosis w/ Ascites - Likely due to Hepatitis C.   P:   Continuing NPO Continuing Tube Feedings Continuing daily Lactulose enemas  HEMATOLOGIC A:   Anemia:  Stable.  Thrombocytopenia:  Stable. Likely due to dilution & liver cirrhosis. LUE Radial Vein DVT Coagulopathy: Resolved. Likely due to cirrhosis. S/P IV Vitamin K 545m8/6.   P:  Trending cell counts daily w/ CBC Trending coags dialy SCDs Transfusing for Hgb  <7.0 Holding systemic anticoagulation   INFECTIOUS A:   Tricuspid Valve Endocarditis MSSA & Pseudomonas Bacteremia/Endocarditis Sepsis - Multiple etiologies.  Cavitary Pneumonia H/O Hepatitis C Viral Infection Right Lower Extremity Cellulitis - Resolved.   P:   Continuing Fortaz  Trending Procalcitonin primary rhythm Continuing Fortaz through 8/25 per ID recommendations Plan to re-culture for fever   ENDOCRINE A:   Hypoglycemia - Resolved w/ Dextrose infusion.  P:   Continuing D5 w/ Bicarb at 50cc/hr Continuing Accu-Checks q4hr w/ MD notification parameters   NEUROLOGIC A:   Acute Encephalopathy - Multifactorial. No documented h/o EtOH use. IV Drug Use.  Sedation on Ventilator  P:   Desired RASS:  0 to -01 Fentanyl for pain relief Versed IV for sedation Thiamine IV daily  Lactulose enema daily   FAMILY  - Updates: No family at bedside today.   - Inter-disciplinary family meet or Palliative Care meeting due by: 08/16/16   DISCUSSION:  [age].o. male with MSSA and Pseudomonas bacteremia. Previously noted tricuspid vegetation. Slight improvement in urine output. Attempting diuresis with albumin and Lasix today.   I have spent a total of 31 minutes of critical care time today caring for the patient and reviewing the patient's electronic medical record.   JeSonia BallereAshok CordiaM.D. [facility] Pager:  [phone] 3pm or if no response, call [phone]/08/2016, 11:46 AM

## 2016-08-15 LAB — BLOOD GAS, ARTERIAL
ACID-BASE DEFICIT: 0.1 mmol/L (ref 0.0–2.0)
Acid-base deficit: 2 mmol/L (ref 0.0–2.0)
Bicarbonate: 22.1 mmol/L (ref 20.0–28.0)
Bicarbonate: 23.4 mmol/L (ref 20.0–28.0)
DRAWN BY: 406621
Drawn by: 270271
FIO2: 40
FIO2: 40
MECHVT: 600 mL
O2 SAT: 98.9 %
O2 Saturation: 99 %
PATIENT TEMPERATURE: 98.6
PCO2 ART: 36.6 mmHg (ref 32.0–48.0)
PCO2 ART: 34.1 mmHg (ref 32.0–48.0)
PEEP/CPAP: 5 cmH2O
PEEP: 5 cmH2O
PH ART: 7.451 — AB (ref 7.350–7.450)
PO2 ART: 108 mmHg (ref 83.0–108.0)
Patient temperature: 98.6
RATE: 20 resp/min
RATE: 20 resp/min
VT: 600 mL
pH, Arterial: 7.399 (ref 7.350–7.450)
pO2, Arterial: 131 mmHg — ABNORMAL HIGH (ref 83.0–108.0)

## 2016-08-15 LAB — CBC WITH DIFFERENTIAL/PLATELET
BASOS PCT: 1 %
Basophils Absolute: 0 10*3/uL (ref 0.0–0.1)
EOS ABS: 0.4 10*3/uL (ref 0.0–0.7)
Eosinophils Relative: 7 %
HEMATOCRIT: 24 % — AB (ref 39.0–52.0)
Hemoglobin: 7.7 g/dL — ABNORMAL LOW (ref 13.0–17.0)
LYMPHS ABS: 1.9 10*3/uL (ref 0.7–4.0)
Lymphocytes Relative: 33 %
MCH: 27.1 pg (ref 26.0–34.0)
MCHC: 32.1 g/dL (ref 30.0–36.0)
MCV: 84.5 fL (ref 78.0–100.0)
MONO ABS: 0.6 10*3/uL (ref 0.1–1.0)
MONOS PCT: 10 %
NEUTROS PCT: 50 %
Neutro Abs: 2.9 10*3/uL (ref 1.7–7.7)
Platelets: 85 10*3/uL — ABNORMAL LOW (ref 150–400)
RBC: 2.84 MIL/uL — ABNORMAL LOW (ref 4.22–5.81)
RDW: 22.3 % — ABNORMAL HIGH (ref 11.5–15.5)
WBC: 5.7 10*3/uL (ref 4.0–10.5)

## 2016-08-15 LAB — RENAL FUNCTION PANEL
ALBUMIN: 1.1 g/dL — AB (ref 3.5–5.0)
Anion gap: 9 (ref 5–15)
BUN: 80 mg/dL — AB (ref 6–20)
CO2: 22 mmol/L (ref 22–32)
Calcium: 7.7 mg/dL — ABNORMAL LOW (ref 8.9–10.3)
Chloride: 109 mmol/L (ref 101–111)
Creatinine, Ser: 4.56 mg/dL — ABNORMAL HIGH (ref 0.61–1.24)
GFR calc Af Amer: 17 mL/min — ABNORMAL LOW (ref >60)
GFR, EST NON AFRICAN AMERICAN: 14 mL/min — AB (ref >60)
Glucose, Bld: 114 mg/dL — ABNORMAL HIGH (ref 65–99)
PHOSPHORUS: 2.6 mg/dL (ref 2.5–4.6)
POTASSIUM: 3.5 mmol/L (ref 3.5–5.1)
Sodium: 140 mmol/L (ref 135–145)

## 2016-08-15 LAB — BODY FLUID CULTURE
CULTURE: NO GROWTH
SPECIAL REQUESTS: NORMAL

## 2016-08-15 LAB — MAGNESIUM
Magnesium: 1.8 mg/dL (ref 1.7–2.4)
Magnesium: 1.8 mg/dL (ref 1.7–2.4)

## 2016-08-15 LAB — GLUCOSE, CAPILLARY
GLUCOSE-CAPILLARY: 111 mg/dL — AB (ref 65–99)
GLUCOSE-CAPILLARY: 104 mg/dL — AB (ref 65–99)
GLUCOSE-CAPILLARY: 106 mg/dL — AB (ref 65–99)
Glucose-Capillary: 114 mg/dL — ABNORMAL HIGH (ref 65–99)
Glucose-Capillary: 100 mg/dL — ABNORMAL HIGH (ref 65–99)

## 2016-08-15 LAB — BASIC METABOLIC PANEL
Anion gap: 10 (ref 5–15)
BUN: 81 mg/dL — AB (ref 6–20)
CHLORIDE: 109 mmol/L (ref 101–111)
CO2: 23 mmol/L (ref 22–32)
Calcium: 8 mg/dL — ABNORMAL LOW (ref 8.9–10.3)
Creatinine, Ser: 4.47 mg/dL — ABNORMAL HIGH (ref 0.61–1.24)
GFR calc Af Amer: 17 mL/min — ABNORMAL LOW (ref >60)
GFR, EST NON AFRICAN AMERICAN: 15 mL/min — AB (ref >60)
Glucose, Bld: 109 mg/dL — ABNORMAL HIGH (ref 65–99)
POTASSIUM: 3.3 mmol/L — AB (ref 3.5–5.1)
Sodium: 142 mmol/L (ref 135–145)

## 2016-08-15 LAB — PROTIME-INR
INR: 1.21
PROTHROMBIN TIME: 15.4 s — AB (ref 11.4–15.2)

## 2016-08-15 LAB — PROCALCITONIN: PROCALCITONIN: 1.3 ng/mL

## 2016-08-15 LAB — APTT: APTT: 100 s — AB (ref 24–36)

## 2016-08-15 MED ORDER — FUROSEMIDE 10 MG/ML IJ SOLN
40.0000 mg | Freq: Four times a day (QID) | INTRAMUSCULAR | Status: AC
  Administered 2016-08-15 (×3): 40 mg via INTRAVENOUS
  Filled 2016-08-15 (×6): qty 4

## 2016-08-15 MED ORDER — VITAL AF 1.2 CAL PO LIQD
1000.0000 mL | ORAL | Status: DC
  Administered 2016-08-15 – 2016-08-17 (×4): 1000 mL

## 2016-08-15 MED ORDER — FENTANYL BOLUS VIA INFUSION
25.0000 ug | INTRAVENOUS | Status: DC | PRN
  Filled 2016-08-15: qty 100

## 2016-08-15 MED ORDER — ALBUMIN HUMAN 5 % IV SOLN
12.5000 g | Freq: Four times a day (QID) | INTRAVENOUS | Status: AC
  Administered 2016-08-15 (×3): 12.5 g via INTRAVENOUS
  Filled 2016-08-15 (×4): qty 250

## 2016-08-15 MED ORDER — SODIUM CHLORIDE 0.9 % IV SOLN
0.4000 ug/kg/h | INTRAVENOUS | Status: DC
  Filled 2016-08-15: qty 2

## 2016-08-15 MED ORDER — DEXMEDETOMIDINE HCL IN NACL 400 MCG/100ML IV SOLN
0.4000 ug/kg/h | INTRAVENOUS | Status: DC
  Administered 2016-08-15: 0.4 ug/kg/h via INTRAVENOUS
  Administered 2016-08-16 (×2): 1.2 ug/kg/h via INTRAVENOUS
  Administered 2016-08-16: 1 ug/kg/h via INTRAVENOUS
  Administered 2016-08-16 – 2016-08-19 (×14): 1.2 ug/kg/h via INTRAVENOUS
  Administered 2016-08-19 (×4): 1 ug/kg/h via INTRAVENOUS
  Administered 2016-08-19: 0.8 ug/kg/h via INTRAVENOUS
  Administered 2016-08-20: 0.7 ug/kg/h via INTRAVENOUS
  Administered 2016-08-20: 0.9 ug/kg/h via INTRAVENOUS
  Filled 2016-08-15 (×29): qty 100

## 2016-08-15 NOTE — Progress Notes (Deleted)
RT Note:  Tried to wean patient 10/5 40% but due to sleepiness patient failed, RT will try again later. Vitals stable, patient resting comfortably, RT will continue to monitor.

## 2016-08-15 NOTE — Progress Notes (Signed)
Nutrition Follow-up  DOCUMENTATION CODES:   Non-severe (moderate) malnutrition in context of chronic illness  INTERVENTION:    Increase Vital AF 1.2 to 65 ml/h (1560 ml)   D/C Pro-stat  Provides 1872 kcal, 117 gm protein, 1265 ml free water daily  NUTRITION DIAGNOSIS:   Malnutrition (Moderate) related to chronic illness (polysubstance abuse) as evidenced by moderate depletion of body fat, moderate depletions of muscle mass, moderate to severe fluid accumulation.  Ongoing  GOAL:   Patient will meet greater than or equal to 90% of their needs  Met with TF  MONITOR:   TF tolerance, Vent status, I & O's  ASSESSMENT:   45 yo male with PMH of substance abuse, tobacco use, IV drug abuse, who was admitted on 7/13 with MSSA bacteremia, endocarditis, PNA, cellulitis of RLE.  Discussed patient in ICU rounds and with RN today. Chest tube in place. Starting on Precedex. Receiving Vital AF 1.2 at 45 ml/h with Pro-stat 30 ml BID to provide 1496 kcal, 111 gm protein, 875 ml free water daily. Patient is currently intubated on ventilator support MV: 11.5 L/min Temp (24hrs), Avg:97.7 F (36.5 C), Min:97.4 F (36.3 C), Max:98 F (36.7 C)  Propofol: off  Diet Order:  Diet NPO time specified  Skin:   (R foot cellulitis, MASD buttocks)  Last BM:  8/8  Height:   Ht Readings from Last 1 Encounters:  08/06/16 _0  (1.803 m)    Weight:   Wt Readings from Last 1 Encounters:  08/15/16 204 lb 5.9 oz (92.7 kg)   Admission weight 152 lbs (69 kg) (7/13)  Ideal Body Weight:  78.2 kg  BMI:  Body mass index is 28.5 kg/m.  Estimated Nutritional Needs:   Kcal:  5537  Protein:  100-115 gm  Fluid:  2.1 L  EDUCATION NEEDS:   No education needs identified at this time  Molli Barrows, [place], [place], [place] Pager [phone] After Hours Pager [phone]

## 2016-08-15 NOTE — Progress Notes (Signed)
CSW was infomred that pt is still intubated at this time. CSW will continue following pt needs regarding SNF placement once ready for discharge.    Claude MangesKierra S. Labradford Schnitker, MSW, LCSW-A Emergency Department Clinical Social Worker [phone]

## 2016-08-15 NOTE — NC FL2 (Deleted)
[place] LEVEL OF CARE SCREENING TOOL     IDENTIFICATION  Patient Name: Johnathan [name] Birthdate: 1970-09-03 Sex: male Admission Date (Current Location): 07/19/2016  [facility] and [place] Number:  Herbalist and Address:  [facility], [place] [address], [place], [place] [address]      Provider Number: [id number]  Attending Physician Name and Address:  [name], MD  Relative Name and Phone Number:       Current Level of Care: Hospital Recommended Level of Care: [place] Prior Approval Number:    Date Approved/Denied:   PASRR Number:  [id number]   Discharge Plan: SNF    Current Diagnoses: Patient Active Problem List   Diagnosis Date Noted  . Acute respiratory failure ([place])   . Anoxic brain injury ([place])   . Recurrent right pleural effusion   . Opiate withdrawal ([place])   . Bacteremia 07/23/2016  . Acute encephalopathy   . Alcohol withdrawal syndrome with complication ([place])   . 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 Bacteremia due to Pseudomonas 07/20/2016  . Sepsis ([place]) 07/19/2016  . UTI (urinary tract infection) 07/19/2016  . ARF (acute renal failure) ([place]) 07/19/2016  . Hyponatremia 07/19/2016  . Abnormal liver function 07/19/2016  . IVDU (intravenous drug user) 07/19/2016  . MSSA bacteremia 07/19/2016  . Meningitis 07/19/2016  . Pneumonia 07/19/2016  . AKI (acute kidney injury) ([place])   . Chronic hepatitis C ([place]) 09/14/2014  . Substance abuse 09/14/2014  . Cigarette smoker 09/14/2014  . Poor dentition 09/14/2014    Orientation RESPIRATION BLADDER Height & Weight      (pt is intubated at this time. )  Vent Indwelling catheter (pt currently has cath. ) Weight: 204 lb 5.9 oz (92.7 kg) Height:  '5\' 11"'  (180.3 cm)  BEHAVIORAL SYMPTOMS/MOOD NEUROLOGICAL BOWEL NUTRITION STATUS      Incontinent Diet (NPO.)  AMBULATORY STATUS COMMUNICATION OF NEEDS Skin   Limited Assist  Verbally Normal                       Personal Care Assistance Level of Assistance  Bathing, Feeding, Dressing Bathing Assistance: Maximum assistance Feeding assistance: Limited assistance Dressing Assistance: Maximum assistance     Functional Limitations Info  Sight, Hearing, Speech Sight Info: Adequate Hearing Info: Adequate Speech Info: Adequate    SPECIAL CARE FACTORS FREQUENCY  PT (By licensed PT)     PT Frequency: 2 times a week.  OT Frequency: 5/wk            Contractures Contractures Info: Not present    Additional Factors Info  Code Status, Allergies Code Status Info: FULL Allergies Info: NKA           Current Medications (08/15/2016):  This is the current hospital active medication list Current Facility-Administered Medications  Medication Dose Route Frequency Provider Last Rate Last Dose  . albumin human 5 % solution 12.5 g  12.5 g Intravenous Q6H Javier Glazier, MD   12.5 g at 08/15/16 0920  . albuterol (PROVENTIL) (2.5 MG/3ML) 0.083% nebulizer solution 2.5 mg  2.5 mg Nebulization Q2H PRN Magdalen Spatz, NP   2.5 mg at 07/30/16 0750  . cefTAZidime (FORTAZ) 2 g in dextrose 5 % 50 mL IVPB  2 g Intravenous Q24H Javier Glazier, MD   Stopped at 08/14/16 1301  . chlorhexidine gluconate (MEDLINE KIT) (PERIDEX) 0.12 % solution 15 mL  15 mL Mouth Rinse BID Salvadore Dom  E, NP   15 mL at 08/15/16 0733  . Chlorhexidine Gluconate Cloth 2 % PADS 6 each  6 each Topical Daily Cristal Ford, DO   6 each at 08/14/16 [phone]  . dexmedetomidine (PRECEDEX) 200 mcg in sodium chloride 0.9 % 50 mL (4 mcg/mL) infusion  0.4-1.2 mcg/kg/hr Intravenous Titrated Javier Glazier, MD      . dextrose 50 % solution 50 mL  1 ampule Intravenous PRN Rai, Ripudeep K, MD   50 mL at 08/09/16 0031  . feeding supplement (PRO-STAT SUGAR FREE 64) liquid 30 mL  30 mL Per Tube BID Erick Colace, NP   30 mL at 08/15/16 0923  . feeding supplement (VITAL AF 1.2 CAL) liquid 1,000 mL   1,000 mL Per Tube Continuous [name], MD 45 mL/hr at 08/15/16 0800 1,000 mL at 08/15/16 0800  . fentaNYL (SUBLIMAZE) bolus via infusion 25-100 mcg  25-100 mcg Intravenous Q30 min PRN Javier Glazier, MD   50 mcg at 08/14/16 [phone]  . fentaNYL (SUBLIMAZE) injection 25-100 mcg  25-100 mcg Intravenous Q1H PRN Javier Glazier, MD      . fentaNYL 2524mg in NS 2528m(1037mml) infusion-PREMIX  25-400 mcg/hr Intravenous Continuous NesJavier GlazierD 35 mL/hr at 08/15/16 0800 350 mcg/hr at 08/15/16 0800  . folic acid (FOLVITE) tablet 1 mg  1 mg Per Tube Daily BabErick ColaceP   1 mg at 08/15/16 0921  . furosemide (LASIX) injection 40 mg  40 mg Intravenous Q6H NesJavier GlazierD   40 mg at 08/15/16 [id number] ipratropium (ATROVENT) nebulizer solution 0.5 mg  0.5 mg Nebulization Q6H NesJavier GlazierD   0.5 mg at 08/15/16 0800  . lactulose (CHRONULAC) enema 200 gm  300 mL Rectal Daily NesJavier GlazierD   300 mL at 08/15/16 0924  . MEDLINE mouth rinse  15 mL Mouth Rinse QID BabErick ColaceP   15 mL at 08/15/16 0453  . midazolam (VERSED) injection 1-4 mg  1-4 mg Intravenous Q1H PRN NesJavier GlazierD   2 mg at 08/15/16 0723  . pantoprazole sodium (PROTONIX) 40 mg/20 mL oral suspension 40 mg  40 mg Per Tube Daily LucPatterson HammersmithPH   40 mg at 08/15/16 [id number] sodium chloride flush (NS) 0.9 % injection 10-40 mL  10-40 mL Intracatheter PRN MikCristal FordO   10 mL at 08/09/16 2243  . sodium chloride flush (NS) 0.9 % injection 10-40 mL  10-40 mL Intracatheter Q12H FeiRaylene MiyamotoD   10 mL at 08/15/16 [id number] sodium chloride flush (NS) 0.9 % injection 10-40 mL  10-40 mL Intracatheter PRN FeiRaylene MiyamotoD      . sodium chloride flush (NS) 0.9 % injection 3 mL  3 mL Intravenous Q12H KimJani GravelD   3 mL at 08/15/16 0923  . thiamine (B-1) injection 100 mg  100 mg Intravenous Daily NesJavier GlazierD   100 mg at 08/15/16 [id number]  Discharge  Medications: Please see discharge summary for a list of discharge medications.  Relevant Imaging Results:  Relevant Lab Results:   Additional Information SSN- [id number] BjornstadCSWA

## 2016-08-15 NOTE — Progress Notes (Signed)
[facility] PULMONARY / CRITICAL CARE MEDICINE CONSULT   Name: Johnathan [name] MRN: [id number] DOB: 23-Aug-1970    ADMISSION DATE:  07/19/2016 CONSULTATION DATE:  07/23/2016  REFERRING MD:  Ree Kida  HISTORY OF PRESENT ILLNESS:   Johnathan [name] a 46 y.o.male,w Hep C?, IVDA, (recently injected opana) apparently c/o fever intermittently for  1 week, and redness over the dorsum of the right foot and slight swelling. Pt notes injecting Opana about 2 days ago and cellulitis in right foot noted on admission 7/13 with labs showing EKG and abnormal LFTs with hyponatremia 123 and right lower lobe infiltrate on chest x-ray. Found to have MSSA bacteremia, possible meningitis, and LE cellulitis.  Echocardiogram Showed EF 94-76%, grade 1 diastolic dysfunction, tricuspid valve with mobile vegetation. On 7/17 He developed acute encephalopathy with agitation, becoming belligerent and combative. He denies ETOH abuse, but admitted to IVDA. He was placed on CIWA protocol, however, scores remained 19-23, patient was not responsive  to ativan, transferred to ICU for precedex gtt   SUBJECTIVE:  Patient diuresed some with Lasix & Albumin yesterday. No acute event overnight. Patient still agitated & not following commands.   REVIEW OF SYSTEMS:  Unable to obtain given intubation & sedation.   VITAL SIGNS: BP (!) 160/104 (BP Location: Left Leg)   Pulse 68   Temp (!) 97.4 F (36.3 C) (Oral)   Resp 20   Ht _0  (1.803 m)   Wt 204 lb 5.9 oz (92.7 kg)   SpO2 100%   BMI 28.50 kg/m   HEMODYNAMICS:    VENTILATOR SETTINGS: Vent Mode: PRVC FiO2 (%):  [40 %] 40 % Set Rate:  [20 bmp] 20 bmp Vt Set:  [600 mL] 600 mL PEEP:  [5 cmH20] 5 cmH20 Plateau Pressure:  [12 cmH20-23 cmH20] 12 cmH20  INTAKE / OUTPUT: I/O last 3 completed shifts: In: 5206.7 [I.V.:3356.7; NG/GT:1800; IV Piggyback:50] Out: 5465 [Urine:895; Stool:1095; Chest Tube:2215]  PHYSICAL EXAMINATION: General:  No family present. Eyes closed.  Sedated. Integument:  Warm. Dry. No rash on exposed skin. HEENT:  Endotracheal tube in place. Moist membranes. Cardiovascular:  Anasarca. Regular rhythm & rate. Pulmonary:  Distant breath sounds. Symmetric chest wall rise. Chest tube in place. Abdomen: Protuberant. Soft. Normal bowel sounds. Neurological: Again opens eyes to voice. Not following commands. Spontaneously moves extremities.   LABS:  BMET  Recent Labs Lab 08/13/16 1850 08/14/16 0430 08/15/16 0445  NA 142 141 140  K 3.3* 3.2* 3.5  CL 115* 113* 109  CO2 19* 20* 22  BUN 71* 77* 80*  CREATININE 4.27* 4.41* 4.56*  GLUCOSE 106* 125* 114*    Electrolytes  Recent Labs Lab 08/13/16 0420 08/13/16 1329 08/13/16 1850 08/14/16 0430 08/15/16 0445  CALCIUM 7.9* 7.9* 7.6* 7.6* 7.7*  MG 1.7  --   --  1.4* 1.8  PHOS 1.8* 2.0*  --  2.3* 2.6    CBC  Recent Labs Lab 08/13/16 0420 08/13/16 1329 08/14/16 0430 08/15/16 0445  WBC 6.0  --  6.1 5.7  HGB 6.3* 7.6* 7.5* 7.7*  HCT 19.3* 23.0* 22.8* 24.0*  PLT 100*  --  96* 85*    Coag's  Recent Labs Lab 08/13/16 0420 08/14/16 0430 08/15/16 0445  APTT 107* 125* 100*  INR 1.41 1.29 1.21    Sepsis Markers  Recent Labs Lab 08/11/16 0245 08/11/16 1800 08/12/16 0400 08/14/16 0430 08/15/16 0445  LATICACIDVEN  --  1.0 1.0  --   --   PROCALCITON 1.14  --   --  1.25 1.30  ABG  Recent Labs Lab 08/10/16 0516 08/14/16 0353 08/15/16 0345  PHART 7.286* 7.457* 7.399  PCO2ART 25.7* 30.8* 36.6  PO2ART 133* 117.0* 131*    Liver Enzymes  Recent Labs Lab 08/10/16 0424 08/13/16 0420 08/13/16 1329 08/14/16 0430 08/15/16 0445  AST 20 20  --   --   --   ALT 11* 12*  --   --   --   ALKPHOS 58 58  --   --   --   BILITOT 0.8 0.7  --   --   --   ALBUMIN 1.1* <1.0*  <1.0* <1.0* <1.0* 1.1*    Cardiac Enzymes  Recent Labs Lab 08/09/16 1142 08/09/16 1742 08/10/16 0023  TROPONINI <0.03 <0.03 <0.03    Glucose  Recent Labs Lab 08/14/16 1206  08/14/16 1527 08/14/16 1936 08/14/16 2310 08/15/16 0328 08/15/16 0726  GLUCAP 108* 112* 105* 114* 111* 104*    Imaging No results found.  STUDIES:  RENAL U/S 7/13:   IMPRESSION: 1. Mild right pelvicaliectasis, with ureteral jet noted in the urinary bladder excluding significant ureteral obstruction. 2. Small left renal cyst. 3. Pleural effusions and abdominal ascites. LP 7/13:  Tube 1 - WBC 51 (62% lymph, 29% lymph, 9% mono), RBC 555, Protein 88 , & Glucose 52. / Tube 4 - WBC 180 (58% neutro, 31% lymph, 11% monocytes) & RBC 36. TTE 7/14:  LV normal in size with EF 55-60%. No regional wall motion abnormalities & grade 1 diastolic dysfunction. LA upper limits of normal in size & RA normal in size. RV normal in size and function. No aortic stenosis or regurgitation. Aortic root normal in size. No mitral stenosis or regurgitation. No pulmonic stenosis or regurgitation with poorly visualized valve. No tricuspid regurgitation. Medium sized mobile vegetation noted on tricuspid valve. No pericardial effusion. CT HEAD W/O 7/16:  Progressive atrophy since 2011.  No acute intracranial findings. No abnormal enhancement or vasogenic edema to suggest septic emboli to the brain. VENOUS DUPLEX BILATERAL LOWER EXTREMITY 7/17:  No DVT or SVT.  CTA CHEST 7/24:   IMPRESSION: 1. Negative for a pulmonary embolism. 2. Extensive pleural-parenchymal lung disease bilaterally. Large right pleural effusion and moderate-sized left pleural effusion. Pleural-based nodular opacities could represent an infectious etiology but also raise concern for a neoplastic process. Concern for a cavitary lesion and necrotic tissue in the right lung. Consider sampling of the pleural fluid. Recommend follow-up CT when the pleural fluid and acute process have resolved to exclude neoplastic disease. 3. Severe emphysematous disease. 4. Cirrhosis with evidence for portal hypertension based on the splenomegaly and ascites. 5. 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 Recommend annual imaging followup by CTA or MRA. This recommendation follows 2010 ACCF/AHA/AATS/ACR/ASA/SCA/SCAI/SIR/STS/SVM Guidelines for the Diagnosis and Management of Patients with Thoracic Aortic Disease. Circulation. 2010; 121: [id number] VENOUS DUPLEX LUE 7/24:  Acute DVT involving small portion of left radial vein in mid-forearm.  CT HEAD W/O 7/26:  Normal head CT. Right Pleural Effusion 7/26:  1.5 L hazy yellow fluid by IR. Protein <3.0, Albumin <1.0, Amylase 39, Glucose 72, LDH 72, & WBC 2431 (11% lymph, 82% neutro, & 7% mono). Cytology negative for malignancy.  MRI BRAIN W/O 8/2:  Exam is limited to diffusion only. Negative for acute infarct. No area of restricted diffusion. EEG 8/2:  This EEG is abnormal due to moderate diffuse slowing of the background. Lingula BAL 8/3:  WBC 78 (39% lymph, 31% neutro, 30% mono). Reported DAH.  TTE 8/3:  LV normal in size with EF 55-60%. No regional wall motion abnormality & grade 1 diastolic dysfunction. LA & RA normal in size. RV normal in size and function. No aortic stenosis or regurgitation. Aortic root normal in size. No mitral stenosis or regurgitation. No pulmonic stenosis. Trivial regurgitation. Trivial pericardial effusion as well. Notably no vegetations were seen on this exam. Right Pleural Effusion 8/5:  WBC 616 (1% lymph, 1% eos, & 98% neutro). PORT CXR 8/5:  Previously reviewed by me. Right-sided predominant opacities. Right-sided chest tube in place. Endotracheal tube in good position. Enteric feeding tube in good position. Right upper extremity PICC line in good position. Complete Abdominal U/S 8/6: IMPRESSION: 1. Stable cirrhotic changes involving the liver without definite focal hepatic lesion. 2. Mild common bile duct dilatation without obvious common bile duct stone or intrahepatic biliary dilatation. 3. Poor visualization of the pancreas. 4. Increased echogenicity of both kidneys  suggesting medical renal disease. No hydronephrosis. 5. Large volume ascites and bilateral pleural effusions. Port CXR 8/7:  Previously reviewed by me. Hazy opacities bilateral lower lung zones. Endotracheal tube & enteric feeding tube in good position. Right chest tube in good position.   MICROBIOLOGY: Blood Cultures x2 7/12:  2/2 Bottles MSSA & 1/2 Bottles Pseudomonas aeruginosa  CSF Culture 7/13:  Negative  MRSA PCR 7/13:  Negative HIV 7/13:  Negative Respiratory Viral Panel PCR 7/13:  Negative  Urine Culture 7/13:  Multiple Species Present Blood Cultures x2 7/14:  1/2 Bottles MSSA Blood Culture x1 7/15:  Negative  MRSA PCR 7/17:  Negative  Urine Culture 7/17:  Negative  Urine Streptococcal Antigen 7/17:  Negative Urine Legionella Antigen 7/17:  Negative  Right Pleural Fluid Culture 7/26 >>> Bacteria Negative / AFB pending / Fungus pending Lingula BAL 8/3:  Candida tropicalis  Blood Cultures x2 8/3:  Negative  Right Pleural Culture 8/5 >>> Blood Culture x2 8/6 >>>  ANTIBIOTICS: Ceftriaxone 7/13 (x1 dose) Zosyn 7/13 (x1 dose) Nafcillin 7/13 - 7/16 Cefepime 7/13 - 8/6 Vancomycin 7/13 (x1 dose); restarted 8/3 - 8/7 Tressie Ellis 8/6 >>> (plan to continue through 8/25 per ID recs)  SIGNIFICANT EVENTS: 7/17 - Transfer to ICU for precedex infusion 7/21 - Precedex off  7/25 - PCCM signed off 8/02 - Patient hypothermic & more agitated 8/03 - acute tachypnea w/ "guppy breathing" >> emergently intubated w/ bronchoscopy showing "mild diffuse alveolar hemorrhage" 8/05 - Right chest tube inserted w/ 350 mL serous fluid. Bicarb gtt started for acidosis 8/06 - Switched off Cefepime to [place] given potential for worsening encephalopathy  8/08 - Albumin w/ Lasix today. Improving UOP.  8/09 - Albumin w/ Lasix q6hr x3 doses  LINES/TUBES: OETT 8/3 >>> R CHEST TUBE 8/5 >>> RUE DL PICC 7/27 >>> Foley >>>  ASSESSMENT / PLAN:  PULMONARY A: Acute hypoxic respiratory failure - Multifactorial.    Cavitary Pneumonia - Likely due to tricuspid valve endocarditis.  Hemoptysis/DAH - Likely due to cavitary pneumonia. Improving. 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 Transudative Right Pleural Effusion - S/P Chest tube placement. 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  P: Full Vent Support SBT & PS weaning as mental status allows Continuing right chest tube to suction Atrovent nebulized every 6 hours May require 3% hypertonic saline nebs if has further mucus plugging See ID & Heme below   CARDIOVASCULAR A:  Essential Hypertension:  Monitoring with diuresis.  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 LUE Radial Vein DVT - Seen on duplex 7/24. No  PE on CTA 7/24.  P: Monitoring vitals per unit protocol Continuous telemetry monitoring Previously refused transesophageal echocardiogram Holding systemic anticoagulation given hemoptysis/diffuse alveolar hemorrhage & thrombocytopenia  RENAL A:   Recurrent Acute Renal Failure:  Improving urine output.  Metabolic Acidosis:  Improving.  Hypokalemia:  Resolved. Hypomagnesemia:  Resolved. Hypophosphatemia :  Resolved.  P:   Discontinuing Bicarb drip Repeat Electrolytes & BMP @ 1700 today Monitoring UOP with Lasix & Albumin diuresis q6hr today Holding on Nephrology consult  GASTROINTESTINAL A:   Moderate Protein-Calorie Malnutrition Hypoalbuminemia Liver Cirrhosis w/ Ascites - Likely due to Hepatitis C.   P:   Continuing NPO Continuing Tube Feedings Continuing daily Lactulose enemas  HEMATOLOGIC A:   Anemia:  Stable. No signs of bright red blood loss. Thrombocytopenia:  Stable. Likely due to dilution & liver cirrhosis. LUE Radial Vein DVT Coagulopathy: Stable. Likely due to cirrhosis. S/P IV Vitamin K 42m 8/6.   P:  Trending cell counts daily w/ CBC Trending coags dialy SCDs Transfusing for Hgb <7.0 Holding systemic anticoagulation   INFECTIOUS A:   Tricuspid Valve  Endocarditis MSSA & Pseudomonas Bacteremia/Endocarditis Sepsis - Multiple etiologies.  Cavitary Pneumonia H/O Hepatitis C Viral Infection Right Lower Extremity Cellulitis - Resolved.   P:   Continuing Fortaz  Trending Procalcitonin per algorithm Continuing Fortaz through 8/25 per ID recommendations Plan to re-culture for fever   ENDOCRINE A:   Hypoglycemia - Resolved w/ Dextrose infusion.  P:   Discontinuing D5 infusion today  Continuing Accu-Checks q4hr w/ MD notification parameters   NEUROLOGIC A:   Acute Encephalopathy - Multifactorial. Question element of Delirium. No documented h/o EtOH use. IV Drug Use.  Sedation on Ventilator  P:   Desired RASS:  0 to -1 Fentanyl for pain relief Versed IV for sedation Thiamine IV daily  Lactulose enema daily  Attempting to treat with Precedex drip  FAMILY  - Updates: No family at bedside during rounds 8/9.   - Inter-disciplinary family meet or Palliative Care meeting due by: 08/16/16   DISCUSSION:  46y.o. male with MSSA and Pseudomonas bacteremia. Previously noted to have tricuspid vegetation not present on repeat echocardiogram. Continues to be in a volume overload status. Attempting further diuresis with Lasix and albumin today.  I have spent a total of 33 minutes of critical care time today caring for the patient and reviewing the patient's electronic medical record.   JSonia BallerNAshok Cordia M.D. LCasper Wyoming End[facility] Dba Sterling Surgical CenterPulmonary & Critical Care Pager:  [phone] 3pm or if no response, call [phone]/09/2016, 8:45 AM

## 2016-08-15 NOTE — Progress Notes (Signed)
RT Note:  Tried to wean patient 10/5 40% but due to sleepiness patient failed, RT will try again later. Vitals stable, patient resting comfortably, RT will continue to monitor. 

## 2016-08-16 LAB — CBC WITH DIFFERENTIAL/PLATELET
BASOS PCT: 1 %
Basophils Absolute: 0.1 10*3/uL (ref 0.0–0.1)
EOS PCT: 5 %
Eosinophils Absolute: 0.3 10*3/uL (ref 0.0–0.7)
HEMATOCRIT: 23.6 % — AB (ref 39.0–52.0)
Hemoglobin: 7.8 g/dL — ABNORMAL LOW (ref 13.0–17.0)
LYMPHS PCT: 32 %
Lymphs Abs: 2.1 10*3/uL (ref 0.7–4.0)
MCH: 28.4 pg (ref 26.0–34.0)
MCHC: 33.1 g/dL (ref 30.0–36.0)
MCV: 85.8 fL (ref 78.0–100.0)
MONO ABS: 0.6 10*3/uL (ref 0.1–1.0)
Monocytes Relative: 9 %
NEUTROS PCT: 53 %
Neutro Abs: 3.5 10*3/uL (ref 1.7–7.7)
PLATELETS: 76 10*3/uL — AB (ref 150–400)
RBC: 2.75 MIL/uL — AB (ref 4.22–5.81)
RDW: 22.4 % — AB (ref 11.5–15.5)
WBC: 6.6 10*3/uL (ref 4.0–10.5)

## 2016-08-16 LAB — GLUCOSE, CAPILLARY
GLUCOSE-CAPILLARY: 128 mg/dL — AB (ref 65–99)
GLUCOSE-CAPILLARY: 69 mg/dL (ref 65–99)
Glucose-Capillary: 129 mg/dL — ABNORMAL HIGH (ref 65–99)
Glucose-Capillary: 133 mg/dL — ABNORMAL HIGH (ref 65–99)
Glucose-Capillary: 160 mg/dL — ABNORMAL HIGH (ref 65–99)
Glucose-Capillary: 141 mg/dL — ABNORMAL HIGH (ref 65–99)
Glucose-Capillary: 153 mg/dL — ABNORMAL HIGH (ref 65–99)
Glucose-Capillary: 258 mg/dL — ABNORMAL HIGH (ref 65–99)

## 2016-08-16 LAB — PROTIME-INR
INR: 1.33
Prothrombin Time: 16.6 seconds — ABNORMAL HIGH (ref 11.4–15.2)

## 2016-08-16 LAB — RENAL FUNCTION PANEL
Albumin: 1.4 g/dL — ABNORMAL LOW (ref 3.5–5.0)
Anion gap: 8 (ref 5–15)
BUN: 82 mg/dL — AB (ref 6–20)
CALCIUM: 8.2 mg/dL — AB (ref 8.9–10.3)
CHLORIDE: 109 mmol/L (ref 101–111)
CO2: 25 mmol/L (ref 22–32)
CREATININE: 4.39 mg/dL — AB (ref 0.61–1.24)
GFR calc Af Amer: 17 mL/min — ABNORMAL LOW (ref >60)
GFR, EST NON AFRICAN AMERICAN: 15 mL/min — AB (ref >60)
Glucose, Bld: 135 mg/dL — ABNORMAL HIGH (ref 65–99)
Phosphorus: 2.8 mg/dL (ref 2.5–4.6)
Potassium: 3.5 mmol/L (ref 3.5–5.1)
SODIUM: 142 mmol/L (ref 135–145)

## 2016-08-16 LAB — MAGNESIUM: Magnesium: 1.9 mg/dL (ref 1.7–2.4)

## 2016-08-16 LAB — APTT: APTT: 102 s — AB (ref 24–36)

## 2016-08-16 MED ORDER — HEPARIN SODIUM (PORCINE) 5000 UNIT/ML IJ SOLN
5000.0000 [IU] | Freq: Three times a day (TID) | INTRAMUSCULAR | Status: DC
  Administered 2016-08-16 – 2016-08-20 (×12): 5000 [IU] via SUBCUTANEOUS
  Filled 2016-08-16 (×13): qty 1

## 2016-08-16 MED ORDER — DEXTROSE 50 % IV SOLN: 25.0000 mL | Freq: Once | INTRAVENOUS | Status: DC

## 2016-08-16 MED ORDER — FUROSEMIDE 10 MG/ML IJ SOLN
40.0000 mg | Freq: Four times a day (QID) | INTRAMUSCULAR | Status: AC
  Administered 2016-08-16 (×3): 40 mg via INTRAVENOUS
  Filled 2016-08-16 (×2): qty 4

## 2016-08-16 NOTE — Progress Notes (Signed)
[facility] PULMONARY / CRITICAL CARE MEDICINE CONSULT   Name: Johnathan [name] MRN: [id number] DOB: [birth year]    ADMISSION DATE:  07/19/2016 CONSULTATION DATE:  07/23/2016  REFERRING MD:  Johnathan [name]  HISTORY OF PRESENT ILLNESS:   [name] a 46 y.o.male,w Hep C?, IVDA, (recently injected opana) apparently c/o fever intermittently for  1 week, and redness over the dorsum of the right foot and slight swelling. Pt notes injecting Opana about 2 days ago and cellulitis in right foot noted on admission 7/13 with labs showing EKG and abnormal LFTs with hyponatremia 123 and right lower lobe infiltrate on chest x-ray. Found to have MSSA bacteremia, possible meningitis, and LE cellulitis.  Echocardiogram Showed EF 97-35%, grade 1 diastolic dysfunction, tricuspid valve with mobile vegetation. On 7/17 He developed acute encephalopathy with agitation, becoming belligerent and combative. He denies ETOH abuse, but admitted to IVDA. He was placed on CIWA protocol, however, scores remained 19-23, patient was not responsive  to ativan, transferred to ICU for precedex gtt   SUBJECTIVE:  Diuresis yesterday. No acute events overnight. Cooperated with bathing today and was following commands per nurse.   REVIEW OF SYSTEMS:  Unable to obtain given intubation & sedation.   VITAL SIGNS: BP (!) 133/96   Pulse (!) 59   Temp 98.1 F (36.7 C) (Oral)   Resp 20   Ht _0  (1.803 m)   Wt 208 lb 12.4 oz (94.7 kg)   SpO2 99%   BMI 29.12 kg/m   HEMODYNAMICS:    VENTILATOR SETTINGS: Vent Mode: PRVC FiO2 (%):  [30 %-40 %] 30 % Set Rate:  [20 bmp] 20 bmp Vt Set:  [600 mL] 600 mL PEEP:  [5 cmH20] 5 cmH20 Plateau Pressure:  [12 cmH20-23 cmH20] 13 cmH20  INTAKE / OUTPUT: I/O last 3 completed shifts: In: 3949.1 [I.V.:1971.8; NG/GT:1977.3] Out: 3299 [Urine:2355; Stool:1600; Chest Tube:1175]  PHYSICAL EXAMINATION: General:  No family at bedside. Sleeping. No distress. Integument:  No rash. Warm. Dry.   HEENT:  No scleral icterus. Moist membranes. ETT in place. Cardiovascular:  Regular rate. Anasarca continuing.  Pulmonary:  Clear to auscultation. Symmetric chest rise. Yellow serous drainage from chest tube on right.  Abdomen:  Normal bowel sounds. Soft. Protuberant.  Neurological:  Sedated. Not opening eyes with stimulation. Pupils pinpoint.   LABS:  BMET  Recent Labs Lab 08/15/16 0445 08/15/16 2121 08/16/16 0329  NA 140 142 142  K 3.5 3.3* 3.5  CL 109 109 109  CO2 _1 BUN 80* 81* 82*  CREATININE 4.56* 4.47* 4.39*  GLUCOSE 114* 109* 135*    Electrolytes  Recent Labs Lab 08/14/16 0430 08/15/16 0445 08/15/16 2121 08/16/16 0329  CALCIUM 7.6* 7.7* 8.0* 8.2*  MG 1.4* 1.8 1.8 1.9  PHOS 2.3* 2.6  --  2.8    CBC  Recent Labs Lab 08/14/16 0430 08/15/16 0445 08/16/16 0329  WBC 6.1 5.7 6.6  HGB 7.5* 7.7* 7.8*  HCT 22.8* 24.0* 23.6*  PLT 96* 85* 76*    Coag's  Recent Labs Lab 08/14/16 0430 08/15/16 0445 08/16/16 0329  APTT 125* 100* 102*  INR 1.29 1.21 1.33    Sepsis Markers  Recent Labs Lab 08/11/16 0245 08/11/16 1800 08/12/16 0400 08/14/16 0430 08/15/16 0445  LATICACIDVEN  --  1.0 1.0  --   --   PROCALCITON 1.14  --   --  1.25 1.30    ABG  Recent Labs Lab 08/14/16 0353 08/15/16 0345 08/15/16 1720  PHART 7.457* 7.399 7.451*  PCO2ART 30.8*  36.6 34.1  PO2ART 117.0* 131* 108    Liver Enzymes  Recent Labs Lab 08/10/16 0424 08/13/16 0420  08/14/16 0430 08/15/16 0445 08/16/16 0329  AST 20 20  --   --   --   --   ALT 11* 12*  --   --   --   --   ALKPHOS 58 58  --   --   --   --   BILITOT 0.8 0.7  --   --   --   --   ALBUMIN 1.1* <1.0*  <1.0*  < > <1.0* 1.1* 1.4*  < > = values in this interval not displayed.  Cardiac Enzymes  Recent Labs Lab 08/09/16 1142 08/09/16 1742 08/10/16 0023  TROPONINI <0.03 <0.03 <0.03    Glucose  Recent Labs Lab 08/15/16 0726 08/15/16 1145 08/15/16 1528 08/15/16 1918  08/15/16 2309 08/16/16 0717  GLUCAP 104* 114* 106* 100* 129* 133*    Imaging No results found.  STUDIES:  RENAL U/S 7/13:   IMPRESSION: 1. Mild right pelvicaliectasis, with ureteral jet noted in the urinary bladder excluding significant ureteral obstruction. 2. Small left renal cyst. 3. Pleural effusions and abdominal ascites. LP 7/13:  Tube 1 - WBC 51 (62% lymph, 29% lymph, 9% mono), RBC 555, Protein 88 , & Glucose 52. / Tube 4 - WBC 180 (58% neutro, 31% lymph, 11% monocytes) & RBC 36. TTE 7/14:  LV normal in size with EF 55-60%. No regional wall motion abnormalities & grade 1 diastolic dysfunction. LA upper limits of normal in size & RA normal in size. RV normal in size and function. No aortic stenosis or regurgitation. Aortic root normal in size. No mitral stenosis or regurgitation. No pulmonic stenosis or regurgitation with poorly visualized valve. No tricuspid regurgitation. Medium sized mobile vegetation noted on tricuspid valve. No pericardial effusion. CT HEAD W/O 7/16:  Progressive atrophy since 2011.  No acute intracranial findings. No abnormal enhancement or vasogenic edema to suggest septic emboli to the brain. VENOUS DUPLEX BILATERAL LOWER EXTREMITY 7/17:  No DVT or SVT.  CTA CHEST 7/24:   IMPRESSION: 1. Negative for a pulmonary embolism. 2. Extensive pleural-parenchymal lung disease bilaterally. Large right pleural effusion and moderate-sized left pleural effusion. Pleural-based nodular opacities could represent an infectious etiology but also raise concern for a neoplastic process. Concern for a cavitary lesion and necrotic tissue in the right lung. Consider sampling of the pleural fluid. Recommend follow-up CT when the pleural fluid and acute process have resolved to exclude neoplastic disease. 3. Severe emphysematous disease. 4. Cirrhosis with evidence for portal hypertension based on the splenomegaly and ascites. 5. Aneurysm of the ascending thoracic aorta measuring up to  4.3 cm. Recommend annual imaging followup by CTA or MRA. This recommendation follows 2010 ACCF/AHA/AATS/ACR/ASA/SCA/SCAI/SIR/STS/SVM Guidelines for the Diagnosis and Management of Patients with Thoracic Aortic Disease. Circulation. 2010; 121: X094-M768 VENOUS DUPLEX LUE 7/24:  Acute DVT involving small portion of left radial vein in mid-forearm.  CT HEAD W/O 7/26:  Normal head CT. Right Pleural Effusion 7/26:  1.5 L hazy yellow fluid by IR. Protein <3.0, Albumin <1.0, Amylase 39, Glucose 72, LDH 72, & WBC 2431 (11% lymph, 82% neutro, & 7% mono). Cytology negative for malignancy.  MRI BRAIN W/O 8/2:  Exam is limited to diffusion only. Negative for acute infarct. No area of restricted diffusion. EEG 8/2:  This EEG is abnormal due to moderate diffuse slowing of the background. Lingula BAL 8/3:  WBC 78 (39% lymph, 31% neutro, 30% mono).  Reported DAH.  TTE 8/3:  LV normal in size with EF 55-60%. No regional wall motion abnormality & grade 1 diastolic dysfunction. LA & RA normal in size. RV normal in size and function. No aortic stenosis or regurgitation. Aortic root normal in size. No mitral stenosis or regurgitation. No pulmonic stenosis. Trivial regurgitation. Trivial pericardial effusion as well. Notably no vegetations were seen on this exam. Right Pleural Effusion 8/5:  WBC 616 (1% lymph, 1% eos, & 98% neutro). PORT CXR 8/5:  Previously reviewed by me. Right-sided predominant opacities. Right-sided chest tube in place. Endotracheal tube in good position. Enteric feeding tube in good position. Right upper extremity PICC line in good position. Complete Abdominal U/S 8/6: IMPRESSION: 1. Stable cirrhotic changes involving the liver without definite focal hepatic lesion. 2. Mild common bile duct dilatation without obvious common bile duct stone or intrahepatic biliary dilatation. 3. Poor visualization of the pancreas. 4. Increased echogenicity of both kidneys suggesting medical renal disease. No  hydronephrosis. 5. Large volume ascites and bilateral pleural effusions. Port CXR 8/7:  Previously reviewed by me. Hazy opacities bilateral lower lung zones. Endotracheal tube & enteric feeding tube in good position. Right chest tube in good position.   MICROBIOLOGY: Blood Cultures x2 7/12:  2/2 Bottles MSSA & 1/2 Bottles Pseudomonas aeruginosa  CSF Culture 7/13:  Negative  MRSA PCR 7/13:  Negative HIV 7/13:  Negative Respiratory Viral Panel PCR 7/13:  Negative  Urine Culture 7/13:  Multiple Species Present Blood Cultures x2 7/14:  1/2 Bottles MSSA Blood Culture x1 7/15:  Negative  MRSA PCR 7/17:  Negative  Urine Culture 7/17:  Negative  Urine Streptococcal Antigen 7/17:  Negative Urine Legionella Antigen 7/17:  Negative  Right Pleural Fluid Culture 7/26 >>> Bacteria Negative / AFB pending / Fungus pending Lingula BAL 8/3:  Candida tropicalis  Blood Cultures x2 8/3:  Negative  Right Pleural Culture 8/5 >>> Blood Culture x2 8/6 >>>  ANTIBIOTICS: Ceftriaxone 7/13 (x1 dose) Zosyn 7/13 (x1 dose) Nafcillin 7/13 - 7/16 Cefepime 7/13 - 8/6 Vancomycin 7/13 (x1 dose); restarted 8/3 - 8/7 Tressie Ellis 8/6 >>> (plan to continue through 8/25 per ID recs)  SIGNIFICANT EVENTS: 7/17 - Transfer to ICU for precedex infusion 7/21 - Precedex off  7/25 - PCCM signed off 8/02 - Patient hypothermic & more agitated 8/03 - acute tachypnea w/ "guppy breathing" >> emergently intubated w/ bronchoscopy showing "mild diffuse alveolar hemorrhage" 8/05 - Right chest tube inserted w/ 350 mL serous fluid. Bicarb gtt started for acidosis 8/06 - Switched off Cefepime to [place] given potential for worsening encephalopathy  8/08 - Albumin w/ Lasix today. Improving UOP.  8/09 - Albumin w/ Lasix q6hr x3 doses. Precedex started in an attempt to wean Fentanyl drip.   LINES/TUBES: OETT 8/3 >>> R CHEST TUBE 8/5 >>> RUE DL PICC 7/27 >>> Foley >>>  ASSESSMENT / PLAN:  PULMONARY A: Acute hypoxic respiratory failure  - Multifactorial.   Cavitary Pneumonia - Likely due to tricuspid valve endocarditis.  Hemoptysis/DAH - Likely due to cavitary pneumonia. Improving. No bright red blood. 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 Transudative Right Pleural Effusion - S/P Chest tube placement. 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  P: Daily SBT & PS weaning Full Vent Support Atrovent Nebs q6hr Chest tube to suction See ID & Heme below   CARDIOVASCULAR A:  Essential Hypertension:  Monitoring with diuresis.  BP stable.  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 LUE Radial Vein DVT -  Seen on duplex 7/24. No PE on CTA 7/24.  P: Monitoring vitals per unit protocol Continuous telemetry monitoring Previously refused transesophageal echocardiogram Holding systemic anticoagulation   RENAL A:   Recurrent Acute Renal Failure:  Improving urine output.  Metabolic Acidosis:  Improving.  Hypokalemia:  Resolved. Hypomagnesemia:  Resolved. Hypophosphatemia :  Resolved.  P:   Diuresis w/ Lasix 35m IV q6hr x3 doses today Trending electrolytes daily Monitoring UOP with Foley Holding on Nephrology consult  GASTROINTESTINAL A:   Moderate Protein-Calorie Malnutrition Hypoalbuminemia Liver Cirrhosis w/ Ascites - Likely due to Hepatitis C.   P:   Continuing NPO Continuing Tube Feedings Continuing daily Lactulose enemas  HEMATOLOGIC A:   Anemia:  Stable. No signs of bright red blood loss. Thrombocytopenia:  Stable. Likely due to dilution & liver cirrhosis. LUE Radial Vein DVT Coagulopathy: Stable. Likely due to cirrhosis. S/P IV Vitamin K 524m8/6.   P:  Starting Heparin [place] q8hr for DVT prophylaxis SCDs Trending cell counts daily w/ CBC Transfusing for Hgb <7.0 Holding systemic anticoagulation   INFECTIOUS A:   Tricuspid Valve Endocarditis:  Not seen on subsequent TTE.  MSSA & Pseudomonas Bacteremia/Endocarditis Sepsis - Multiple etiologies.  Cavitary  Pneumonia H/O Hepatitis C Viral Infection Right Lower Extremity Cellulitis - Resolved.   P:   Continuing Fortaz through 8/25 per ID recommendations Plan to re-culture for fever  ENDOCRINE A:   Hypoglycemia - Resolved.  P:   Continuing Accu-Checks q4hr w/ MD notification parameters   NEUROLOGIC A:   Acute Encephalopathy:  Improving on Precedex. Multifactorial. Question element of Delirium. No documented h/o EtOH use. IV Drug Use.  Sedation on Ventilator  P:   Desired RASS:  0 to -1 Continuing Precedex drip Fentanyl IV prn pain Thiamine IV daily Lactulose enema daily   FAMILY  - Updates: Mother updated 8/9 after rounds yesterday by Dr. NeAshok Cordia  - Inter-disciplinary family meet or Palliative Care meeting due by: 08/16/16   DISCUSSION:  [age].o. male with MSSA and Pseudomonas bacteremia. Tolerating Precedex. SBT once patient awakens. Diuresis with Lasix again today.    I have spent a total of 31 minutes of critical care time today caring for the patient and reviewing the patient's electronic medical record.   JeSonia BallereAshok CordiaM.D. [facility] Pager:  [phone] 3pm or if no response, call [phone]/10/2016, 10:01 AM

## 2016-08-16 NOTE — Progress Notes (Signed)
Pharmacy Antibiotic Note  Johnathan [name] is a 46 year old male with hx Hep C, IVDA who presented on 7/13 with R-foot redness/swelling with fevers. The patient is noted to have MSSA + Psuedomonas bacteremia with TTE positive for tricuspid valve IE. ID on board.   WBC wnl, afebrile, procal has slight upward trend (1.3 now) but renal function was declining. Scr stabalizing and may start trending down. Per ID, end date for ceftazidine is 8/25.  Plan: Continue ceftazidime 2gm IV Q24H with end date 8/25 Monitor clinical status, renal function    Height: _0  (180.3 cm) Weight: 208 lb 12.4 oz (94.7 kg) IBW/kg (Calculated) : 75.3  Temp (24hrs), Avg:98.2 F (36.8 C), Min:98 F (36.7 C), Max:98.5 F (36.9 C)   Recent Labs Lab 08/11/16 1515 08/11/16 1800 08/12/16 0400 08/13/16 0420  08/13/16 1850 08/14/16 0430 08/15/16 0445 08/15/16 2121 08/16/16 0329  WBC  --   --  6.7 6.0  --   --  6.1 5.7  --  6.6  CREATININE  --  3.77* 3.94* 4.43*  < > 4.27* 4.41* 4.56* 4.47* 4.39*  LATICACIDVEN  --  1.0 1.0  --   --   --   --   --   --   --   VANCOTROUGH 32*  --   --   --   --   --   --   --   --   --   VANCORANDOM  --   --  30 24  --   --   --   --   --   --   < > = values in this interval not displayed.   Zosyn 7/13 >> 7/13 Vanc 7/13 >> 7/13; 8/3 >>8/7 Cefepime 7/13 >> [8/25] Nafcillin 7/13 >> 7/16  7/12 BCx - MSSA + PSA (pan sensitive) 7/13 CSF cx - negative 7/13 RVP- negative 7/13 MRSA PCR - negative 7/13 UCx - negative 7/14 BCx - 1/2 MSSA 7/15 BCx - neg 7/17 Urine- neg 7/26 pleural fluid- gram stain neg, cx ngF 7/26 AFB- neg 7/26 Fungal:neg 8/3 blood cx: pending 8/3 BAL: NGTD 8/3 Blood - [place] PharmD PGY1 Pharmacy Practice Resident 08/16/2016 8:25 AM Pager: [phone]

## 2016-08-17 ENCOUNTER — Inpatient Hospital Stay (HOSPITAL_COMMUNITY): Payer: Medicaid Other

## 2016-08-17 LAB — CBC WITH DIFFERENTIAL/PLATELET
BASOS PCT: 0 %
Basophils Absolute: 0 10*3/uL (ref 0.0–0.1)
EOS ABS: 0.3 10*3/uL (ref 0.0–0.7)
EOS PCT: 3 %
HEMATOCRIT: 25.2 % — AB (ref 39.0–52.0)
Hemoglobin: 8.2 g/dL — ABNORMAL LOW (ref 13.0–17.0)
LYMPHS ABS: 2.1 10*3/uL (ref 0.7–4.0)
Lymphocytes Relative: 25 %
MCH: 28.1 pg (ref 26.0–34.0)
MCHC: 32.5 g/dL (ref 30.0–36.0)
MCV: 86.3 fL (ref 78.0–100.0)
MONO ABS: 0.6 10*3/uL (ref 0.1–1.0)
Monocytes Relative: 7 %
NEUTROS ABS: 5.3 10*3/uL (ref 1.7–7.7)
Neutrophils Relative %: 65 %
Platelets: 85 10*3/uL — ABNORMAL LOW (ref 150–400)
RBC: 2.92 MIL/uL — ABNORMAL LOW (ref 4.22–5.81)
RDW: 22.3 % — AB (ref 11.5–15.5)
WBC: 8.3 10*3/uL (ref 4.0–10.5)

## 2016-08-17 LAB — CULTURE, BLOOD (ROUTINE X 2)
CULTURE: NO GROWTH
Culture: NO GROWTH
Special Requests: ADEQUATE
Special Requests: ADEQUATE

## 2016-08-17 LAB — HEPATIC FUNCTION PANEL
ALT: 17 U/L (ref 17–63)
AST: 28 U/L (ref 15–41)
Albumin: 1.3 g/dL — ABNORMAL LOW (ref 3.5–5.0)
Alkaline Phosphatase: 78 U/L (ref 38–126)
BILIRUBIN DIRECT: 0.1 mg/dL (ref 0.1–0.5)
Indirect Bilirubin: 0.6 mg/dL (ref 0.3–0.9)
Total Bilirubin: 0.7 mg/dL (ref 0.3–1.2)
Total Protein: 5.3 g/dL — ABNORMAL LOW (ref 6.5–8.1)

## 2016-08-17 LAB — BASIC METABOLIC PANEL
Anion gap: 9 (ref 5–15)
BUN: 77 mg/dL — ABNORMAL HIGH (ref 6–20)
CALCIUM: 7.9 mg/dL — AB (ref 8.9–10.3)
CO2: 24 mmol/L (ref 22–32)
CREATININE: 3.79 mg/dL — AB (ref 0.61–1.24)
Chloride: 112 mmol/L — ABNORMAL HIGH (ref 101–111)
GFR calc non Af Amer: 18 mL/min — ABNORMAL LOW (ref >60)
GFR, EST AFRICAN AMERICAN: 21 mL/min — AB (ref >60)
GLUCOSE: 121 mg/dL — AB (ref 65–99)
Potassium: 3.2 mmol/L — ABNORMAL LOW (ref 3.5–5.1)
Sodium: 145 mmol/L (ref 135–145)

## 2016-08-17 LAB — GLUCOSE, CAPILLARY
GLUCOSE-CAPILLARY: 107 mg/dL — AB (ref 65–99)
GLUCOSE-CAPILLARY: 95 mg/dL (ref 65–99)
GLUCOSE-CAPILLARY: 118 mg/dL — AB (ref 65–99)
Glucose-Capillary: 105 mg/dL — ABNORMAL HIGH (ref 65–99)
Glucose-Capillary: 109 mg/dL — ABNORMAL HIGH (ref 65–99)

## 2016-08-17 LAB — AMYLASE: Amylase: 116 U/L — ABNORMAL HIGH (ref 28–100)

## 2016-08-17 LAB — MAGNESIUM
Magnesium: 1.6 mg/dL — ABNORMAL LOW (ref 1.7–2.4)
Magnesium: 2 mg/dL (ref 1.7–2.4)

## 2016-08-17 LAB — RENAL FUNCTION PANEL
ALBUMIN: 1.3 g/dL — AB (ref 3.5–5.0)
Anion gap: 11 (ref 5–15)
BUN: 81 mg/dL — AB (ref 6–20)
CALCIUM: 8 mg/dL — AB (ref 8.9–10.3)
CO2: 23 mmol/L (ref 22–32)
CREATININE: 3.93 mg/dL — AB (ref 0.61–1.24)
Chloride: 110 mmol/L (ref 101–111)
GFR calc Af Amer: 20 mL/min — ABNORMAL LOW (ref >60)
GFR calc non Af Amer: 17 mL/min — ABNORMAL LOW (ref >60)
GLUCOSE: 131 mg/dL — AB (ref 65–99)
PHOSPHORUS: 3 mg/dL (ref 2.5–4.6)
Potassium: 3.1 mmol/L — ABNORMAL LOW (ref 3.5–5.1)
SODIUM: 144 mmol/L (ref 135–145)

## 2016-08-17 LAB — APTT: APTT: 106 s — AB (ref 24–36)

## 2016-08-17 LAB — PROTIME-INR
INR: 1.25
Prothrombin Time: 15.8 seconds — ABNORMAL HIGH (ref 11.4–15.2)

## 2016-08-17 LAB — LIPASE, BLOOD: Lipase: 125 U/L — ABNORMAL HIGH (ref 11–51)

## 2016-08-17 MED ORDER — ONDANSETRON HCL 4 MG/2ML IJ SOLN
4.0000 mg | Freq: Four times a day (QID) | INTRAMUSCULAR | Status: DC | PRN
  Administered 2016-08-17 – 2016-08-24 (×7): 4 mg via INTRAVENOUS
  Filled 2016-08-17 (×7): qty 2

## 2016-08-17 MED ORDER — FUROSEMIDE 10 MG/ML IJ SOLN
40.0000 mg | Freq: Two times a day (BID) | INTRAMUSCULAR | Status: DC
  Administered 2016-08-17 (×2): 40 mg via INTRAVENOUS
  Filled 2016-08-17 (×4): qty 4

## 2016-08-17 MED ORDER — POTASSIUM CHLORIDE 20 MEQ/15ML (10%) PO SOLN
40.0000 meq | Freq: Once | ORAL | Status: AC
  Administered 2016-08-17: 40 meq
  Filled 2016-08-17: qty 30

## 2016-08-17 MED ORDER — VITAL AF 1.2 CAL PO LIQD
1000.0000 mL | ORAL | Status: DC
  Administered 2016-08-19: 1000 mL

## 2016-08-17 MED ORDER — MAGNESIUM SULFATE 2 GM/50ML IV SOLN
2.0000 g | Freq: Once | INTRAVENOUS | Status: AC
  Administered 2016-08-17: 2 g via INTRAVENOUS
  Filled 2016-08-17: qty 50

## 2016-08-17 NOTE — Progress Notes (Signed)
eLink Physician-Brief Progress Note Patient Name: Johnathan HolesChristopher XXXPallas DOB: August 04, 1970 MRN: [id number]   Date of Service  08/17/2016  HPI/Events of Note  N/V  eICU Interventions  Zofran PRN     Intervention Category Major Interventions: Other:  YACOUB,WESAM 08/17/2016, 3:05 AM

## 2016-08-17 NOTE — Progress Notes (Signed)
RN notified of pt 95.1 F orally

## 2016-08-17 NOTE — Progress Notes (Signed)
[facility] PULMONARY / CRITICAL CARE MEDICINE CONSULT   Name: Johnathan [name] MRN: [id number] DOB: 03/25/1970    ADMISSION DATE:  07/19/2016 CONSULTATION DATE:  07/23/2016  REFERRING MD:  Johnathan [name]  HISTORY OF PRESENT ILLNESS:   Johnathan [name] a 46 y.o.male,w Hep C?, IVDA, (recently injected opana) apparently c/o fever intermittently for  1 week, and redness over the dorsum of the right foot and slight swelling. Pt notes injecting Opana about 2 days ago and cellulitis in right foot noted on admission 7/13 with labs showing EKG and abnormal LFTs with hyponatremia 123 and right lower lobe infiltrate on chest x-ray. Found to have MSSA bacteremia, possible meningitis, and LE cellulitis.  Echocardiogram Showed EF 35-00%, grade 1 diastolic dysfunction, tricuspid valve with mobile vegetation. On 7/17 He developed acute encephalopathy with agitation, becoming belligerent and combative. He denies ETOH abuse, but admitted to IVDA. He was placed on CIWA protocol, however, scores remained 19-23, patient was not responsive  to ativan, transferred to ICU for precedex gtt   SUBJECTIVE:  Underwent diuresis yesterday. Patient with N/V overnight and was started on Zofran. Nurse reports bilious emesis from NGT.  REVIEW OF SYSTEMS:  Unable to obtain given intubation & sedation.   VITAL SIGNS: BP (!) 155/90   Pulse 81   Temp (!) 97.3 F (36.3 C) (Oral)   Resp 18   Ht _0  (1.803 m)   Wt 197 lb 8.5 oz (89.6 kg)   SpO2 99%   BMI 27.55 kg/m   HEMODYNAMICS:    VENTILATOR SETTINGS: Vent Mode: PRVC FiO2 (%):  [30 %] 30 % Set Rate:  [20 bmp] 20 bmp Vt Set:  [600 mL] 600 mL PEEP:  [5 cmH20] 5 cmH20 Plateau Pressure:  [13 cmH20-21 cmH20] 18 cmH20  INTAKE / OUTPUT: I/O last 3 completed shifts: In: 3240.1 [I.V.:1103.6; NG/GT:2086.5; IV Piggyback:50] Out: 9381 [WEXHB:7169; Emesis/NG output:150; CVELF:8101; Chest Tube:1470]  PHYSICAL EXAMINATION: General:  No family at bedside. Sleeping. No  distress. Integument:  No rash. Warm. Dry.  HEENT:  No scleral icterus. Moist membranes. ETT in place. Cardiovascular:  Regular rate. Anasarca continuing.  Pulmonary:  Clear to auscultation. Symmetric chest rise. Yellow serous drainage from chest tube on right.  Abdomen:  Normal bowel sounds. Soft. Protuberant.  Neurological:  Sedated. Not opening eyes with stimulation. Pupils pinpoint.   LABS:  BMET  Recent Labs Lab 08/15/16 2121 08/16/16 0329 08/17/16 0505  NA 142 142 144  K 3.3* 3.5 3.1*  CL 109 109 110  CO2 _1 BUN 81* 82* 81*  CREATININE 4.47* 4.39* 3.93*  GLUCOSE 109* 135* 131*    Electrolytes  Recent Labs Lab 08/15/16 0445 08/15/16 2121 08/16/16 0329 08/17/16 0505  CALCIUM 7.7* 8.0* 8.2* 8.0*  MG 1.8 1.8 1.9 1.6*  PHOS 2.6  --  2.8 3.0    CBC  Recent Labs Lab 08/15/16 0445 08/16/16 0329 08/17/16 0505  WBC 5.7 6.6 8.3  HGB 7.7* 7.8* 8.2*  HCT 24.0* 23.6* 25.2*  PLT 85* 76* 85*    Coag's  Recent Labs Lab 08/15/16 0445 08/16/16 0329 08/17/16 0505  APTT 100* 102* 106*  INR 1.21 1.33 1.25    Sepsis Markers  Recent Labs Lab 08/11/16 0245 08/11/16 1800 08/12/16 0400 08/14/16 0430 08/15/16 0445  LATICACIDVEN  --  1.0 1.0  --   --   PROCALCITON 1.14  --   --  1.25 1.30    ABG  Recent Labs Lab 08/14/16 0353 08/15/16 0345 08/15/16 1720  PHART 7.457* 7.399  7.451*  PCO2ART 30.8* 36.6 34.1  PO2ART 117.0* 131* 108    Liver Enzymes  Recent Labs Lab 08/13/16 0420  08/15/16 0445 08/16/16 0329 08/17/16 0505  AST 20  --   --   --   --   ALT 12*  --   --   --   --   ALKPHOS 58  --   --   --   --   BILITOT 0.7  --   --   --   --   ALBUMIN <1.0*  <1.0*  < > 1.1* 1.4* 1.3*  < > = values in this interval not displayed.  Cardiac Enzymes No results for input(s): TROPONINI, PROBNP in the last 168 hours.  Glucose  Recent Labs Lab 08/16/16 1520 08/16/16 1911 08/16/16 2059 08/16/16 2118 08/17/16 0343 08/17/16 0748   GLUCAP 141* 153* 69 258* 107* 105*    Imaging No results found.  STUDIES:  RENAL U/S 7/13:   IMPRESSION: 1. Mild right pelvicaliectasis, with ureteral jet noted in the urinary bladder excluding significant ureteral obstruction. 2. Small left renal cyst. 3. Pleural effusions and abdominal ascites. LP 7/13:  Tube 1 - WBC 51 (62% lymph, 29% lymph, 9% mono), RBC 555, Protein 88 , & Glucose 52. / Tube 4 - WBC 180 (58% neutro, 31% lymph, 11% monocytes) & RBC 36. TTE 7/14:  LV normal in size with EF 55-60%. No regional wall motion abnormalities & grade 1 diastolic dysfunction. LA upper limits of normal in size & RA normal in size. RV normal in size and function. No aortic stenosis or regurgitation. Aortic root normal in size. No mitral stenosis or regurgitation. No pulmonic stenosis or regurgitation with poorly visualized valve. No tricuspid regurgitation. Medium sized mobile vegetation noted on tricuspid valve. No pericardial effusion. CT HEAD W/O 7/16:  Progressive atrophy since 2011.  No acute intracranial findings. No abnormal enhancement or vasogenic edema to suggest septic emboli to the brain. VENOUS DUPLEX BILATERAL LOWER EXTREMITY 7/17:  No DVT or SVT.  CTA CHEST 7/24:   IMPRESSION: 1. Negative for a pulmonary embolism. 2. Extensive pleural-parenchymal lung disease bilaterally. Large right pleural effusion and moderate-sized left pleural effusion. Pleural-based nodular opacities could represent an infectious etiology but also raise concern for a neoplastic process. Concern for a cavitary lesion and necrotic tissue in the right lung. Consider sampling of the pleural fluid. Recommend follow-up CT when the pleural fluid and acute process have resolved to exclude neoplastic disease. 3. Severe emphysematous disease. 4. Cirrhosis with evidence for portal hypertension based on the splenomegaly and ascites. 5. 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 Recommend annual imaging  followup by CTA or MRA. This recommendation follows 2010 ACCF/AHA/AATS/ACR/ASA/SCA/SCAI/SIR/STS/SVM Guidelines for the Diagnosis and Management of Patients with Thoracic Aortic Disease. Circulation. 2010; 121: T267-T245 VENOUS DUPLEX LUE 7/24:  Acute DVT involving small portion of left radial vein in mid-forearm.  CT HEAD W/O 7/26:  Normal head CT. Right Pleural Effusion 7/26:  1.5 L hazy yellow fluid by IR. Protein <3.0, Albumin <1.0, Amylase 39, Glucose 72, LDH 72, & WBC 2431 (11% lymph, 82% neutro, & 7% mono). Cytology negative for malignancy.  MRI BRAIN W/O 8/2:  Exam is limited to diffusion only. Negative for acute infarct. No area of restricted diffusion. EEG 8/2:  This EEG is abnormal due to moderate diffuse slowing of the background. Lingula BAL 8/3:  WBC 78 (39% lymph, 31% neutro, 30% mono). Reported DAH.  TTE 8/3:  LV normal in  size with EF 55-60%. No regional wall motion abnormality & grade 1 diastolic dysfunction. LA & RA normal in size. RV normal in size and function. No aortic stenosis or regurgitation. Aortic root normal in size. No mitral stenosis or regurgitation. No pulmonic stenosis. Trivial regurgitation. Trivial pericardial effusion as well. Notably no vegetations were seen on this exam. Right Pleural Effusion 8/5:  WBC 616 (1% lymph, 1% eos, & 98% neutro). PORT CXR 8/5:  Previously reviewed by me. Right-sided predominant opacities. Right-sided chest tube in place. Endotracheal tube in good position. Enteric feeding tube in good position. Right upper extremity PICC line in good position. Complete Abdominal U/S 8/6: IMPRESSION: 1. Stable cirrhotic changes involving the liver without definite focal hepatic lesion. 2. Mild common bile duct dilatation without obvious common bile duct stone or intrahepatic biliary dilatation. 3. Poor visualization of the pancreas. 4. Increased echogenicity of both kidneys suggesting medical renal disease. No hydronephrosis. 5. Large volume ascites and  bilateral pleural effusions. Port CXR 8/7:  Previously reviewed by me. Hazy opacities bilateral lower lung zones. Endotracheal tube & enteric feeding tube in good position. Right chest tube in good position.   MICROBIOLOGY: Blood Cultures x2 7/12:  2/2 Bottles MSSA & 1/2 Bottles Pseudomonas aeruginosa  CSF Culture 7/13:  Negative  MRSA PCR 7/13:  Negative HIV 7/13:  Negative Respiratory Viral Panel PCR 7/13:  Negative  Urine Culture 7/13:  Multiple Species Present Blood Cultures x2 7/14:  1/2 Bottles MSSA Blood Culture x1 7/15:  Negative  MRSA PCR 7/17:  Negative  Urine Culture 7/17:  Negative  Urine Streptococcal Antigen 7/17:  Negative Urine Legionella Antigen 7/17:  Negative  Right Pleural Fluid Culture 7/26 >>> Bacteria Negative / AFB pending / Fungus pending Lingula BAL 8/3:  Candida tropicalis  Blood Cultures x2 8/3:  Negative  Right Pleural Culture 8/5:  Negative  Blood Culture x2 8/6 >>>  ANTIBIOTICS: Ceftriaxone 7/13 (x1 dose) Zosyn 7/13 (x1 dose) Nafcillin 7/13 - 7/16 Cefepime 7/13 - 8/6 Vancomycin 7/13 (x1 dose); restarted 8/3 - 8/7 Tressie Ellis 8/6 >>> (plan to continue through 8/25 per ID recs)  SIGNIFICANT EVENTS: 7/17 - Transfer to ICU for precedex infusion 7/21 - Precedex off  7/25 - PCCM signed off 8/02 - Patient hypothermic & more agitated 8/03 - acute tachypnea w/ "guppy breathing" >> emergently intubated w/ bronchoscopy showing "mild diffuse alveolar hemorrhage" 8/05 - Right chest tube inserted w/ 350 mL serous fluid. Bicarb gtt started for acidosis 8/06 - Switched off Cefepime to [place] given potential for worsening encephalopathy  8/08 - Albumin w/ Lasix today. Improving UOP.  8/09 - Albumin w/ Lasix q6hr x3 doses. Precedex started in an attempt to wean Fentanyl drip.  8/10 - N/V of bilious emesis overnight  LINES/TUBES: OETT 8/3 >>> R CHEST TUBE 8/5 >>> RUE DL PICC 7/27 >>> Foley >>>  ASSESSMENT / PLAN:  PULMONARY A: Acute hypoxic respiratory  failure - Multifactorial.   Cavitary Pneumonia - Likely due to tricuspid valve endocarditis.  Hemoptysis/DAH - Likely due to cavitary pneumonia. Improving. No bright red blood. 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 Transudative Right Pleural Effusion - S/P Chest tube placement. 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  P: Continuing daily pressure support weaning and spontaneous breathing trial Pulmonary and support Atrovent nebulized every 6 hours Continue chest tube to suction  See ID & Heme below  Checking Port CXR to confirm tube position  CARDIOVASCULAR A:  Essential Hypertension:  Monitoring with diuresis.  BP stable.  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 LUE Radial Vein DVT - Seen on duplex 7/24. No PE on CTA 7/24.  P: Monitoring vitals per unit protocol Continuous telemetry monitoring Previously refused transesophageal echocardiogram Holding systemic anticoagulation   RENAL A:   Recurrent Acute Renal Failure:  Improving. Good UOP with diuresis. Metabolic Acidosis:  Improving.  Hypokalemia:  Replaced. Hypomagnesemia:  Replaced. Hypophosphatemia :  Resolved.  P:   Continuing Diuresis w/ Lasix 7m IV q12hr today KCl 482m via tube x1 Magnesium Sulfate 2gm IV Repeat BMP & Magnesium at 5pm today Trending electrolytes daily Monitoring UOP with Foley  GASTROINTESTINAL A:   New Nausea w/ Bilious Emesis Moderate Protein-Calorie Malnutrition Hypoalbuminemia Liver Cirrhosis w/ Ascites - Likely due to Hepatitis C.   P:   Continuing NPO Checking Abdominal X-ray Portable Checking Stat LFTs, Amylase, & Lipase Continuing daily Lactulose enemas  HEMATOLOGIC A:   Anemia:  Stable. No signs of bright red blood loss. Thrombocytopenia:  Stable. Likely due to dilution & liver cirrhosis. LUE Radial Vein DVT Coagulopathy: Stable. Likely due to cirrhosis. S/P IV Vitamin K 42m61m/6.   P:  Trending cell counts daily with  CBC Continuing SCDs & heparin subcutaneous every 8 hour Transfusing for Hgb <7.0 Holding systemic anticoagulation   INFECTIOUS A:   Tricuspid Valve Endocarditis:  Not seen on subsequent TTE.  MSSA & Pseudomonas Bacteremia/Endocarditis Sepsis - Multiple etiologies.  Cavitary Pneumonia H/O Hepatitis C Viral Infection Right Lower Extremity Cellulitis - Resolved.   P:   Continuing [place] through 8/25 per ID recommendations Plan to re-culture for fever  ENDOCRINE A:   Hypoglycemia - Resolved.  P:   Continuing Accu-Checks q4hr w/ MD notification parameters   NEUROLOGIC A:   Acute Encephalopathy:  Improving on Precedex. Multifactorial. Question element of Delirium. No documented h/o EtOH use. IV Drug Use.  Sedation on Ventilator  P:   Desired RASS:  0 to -1 Continuing Precedex drip Fentanyl IV prn pain Thiamine IV daily Lactulose enema daily   FAMILY  - Updates:  Mother updated 8/9 by Dr. NesAshok Cordia DISCUSSION:  45 67o. male with MSSA and pseudomonal bacteremia. Mental status progressively improving. Renal function improving with diuresis. Checking labs and abdominal imaging given nausea with emesis. Repeat electrolytes this afternoon after placement this morning.   I have spent a total of 32 minutes of critical care time today caring for the patient and reviewing the patient's electronic medical record.   JenSonia BallersAshok Cordia.D. [facility] Pager:  [phone] 3pm or if no response, call [phone]/2018, 8:46 AM

## 2016-08-17 NOTE — Progress Notes (Signed)
eLink Physician-Brief Progress Note Patient Name: [name] DOB: 1970-08-31 MRN: [id number]   Date of Service  08/17/2016  HPI/Events of Note  K+ = 3.2 and Creatinine = 3.79. Being actively diuresed with Lasix 40 mg IV Q 12 hours.   eICU Interventions  Will replace K+.         Racquel Arkin Dennard Nipugene 08/17/2016, 6:45 PM

## 2016-08-17 NOTE — Progress Notes (Signed)
Found patient's ETT at 23 at the lip, advanced back to 25 at the lip.

## 2016-08-17 NOTE — Progress Notes (Signed)
Pt vomited his tube feed after suction. Rt  Suctioned orally and inline afterwards.  RT notified RN.  RN is aware and stopped tube feeds.

## 2016-08-18 ENCOUNTER — Inpatient Hospital Stay (HOSPITAL_COMMUNITY): Payer: Medicaid Other

## 2016-08-18 LAB — BLOOD GAS, ARTERIAL
ACID-BASE EXCESS: 1.2 mmol/L (ref 0.0–2.0)
Acid-Base Excess: 0.9 mmol/L (ref 0.0–2.0)
BICARBONATE: 24 mmol/L (ref 20.0–28.0)
Bicarbonate: 24.8 mmol/L (ref 20.0–28.0)
DRAWN BY: 441371
DRAWN BY: 441371
FIO2: 30
FIO2: 30
LHR: 20 {breaths}/min
MECHVT: 600 mL
MECHVT: 450 mL
O2 Saturation: 95.9 %
O2 Saturation: 96.6 %
PATIENT TEMPERATURE: 97.4
PCO2 ART: 29 mmHg — AB (ref 32.0–48.0)
PEEP/CPAP: 5 cmH2O
PEEP/CPAP: 5 cmH2O
PH ART: 7.437 (ref 7.350–7.450)
PO2 ART: 69.6 mmHg — AB (ref 83.0–108.0)
Patient temperature: 97.4
RATE: 12 resp/min
pCO2 arterial: 37.1 mmHg (ref 32.0–48.0)
pH, Arterial: 7.525 — ABNORMAL HIGH (ref 7.350–7.450)
pO2, Arterial: 81.5 mmHg — ABNORMAL LOW (ref 83.0–108.0)

## 2016-08-18 LAB — CBC WITH DIFFERENTIAL/PLATELET
BASOS PCT: 0 %
Basophils Absolute: 0 10*3/uL (ref 0.0–0.1)
EOS ABS: 0.2 10*3/uL (ref 0.0–0.7)
EOS PCT: 3 %
HEMATOCRIT: 26.5 % — AB (ref 39.0–52.0)
HEMOGLOBIN: 8.6 g/dL — AB (ref 13.0–17.0)
LYMPHS PCT: 29 %
Lymphs Abs: 2.4 10*3/uL (ref 0.7–4.0)
MCH: 27.7 pg (ref 26.0–34.0)
MCHC: 32.5 g/dL (ref 30.0–36.0)
MCV: 85.2 fL (ref 78.0–100.0)
MONOS PCT: 6 %
Monocytes Absolute: 0.5 10*3/uL (ref 0.1–1.0)
NEUTROS ABS: 5 10*3/uL (ref 1.7–7.7)
NEUTROS PCT: 62 %
Platelets: 87 10*3/uL — ABNORMAL LOW (ref 150–400)
RBC: 3.11 MIL/uL — ABNORMAL LOW (ref 4.22–5.81)
RDW: 22.8 % — ABNORMAL HIGH (ref 11.5–15.5)
WBC: 8.1 10*3/uL (ref 4.0–10.5)

## 2016-08-18 LAB — RENAL FUNCTION PANEL
ALBUMIN: 1.1 g/dL — AB (ref 3.5–5.0)
ANION GAP: 9 (ref 5–15)
BUN: 74 mg/dL — AB (ref 6–20)
CO2: 24 mmol/L (ref 22–32)
CREATININE: 3.64 mg/dL — AB (ref 0.61–1.24)
Calcium: 7.9 mg/dL — ABNORMAL LOW (ref 8.9–10.3)
Chloride: 115 mmol/L — ABNORMAL HIGH (ref 101–111)
GFR calc Af Amer: 22 mL/min — ABNORMAL LOW (ref >60)
GFR calc non Af Amer: 19 mL/min — ABNORMAL LOW (ref >60)
GLUCOSE: 122 mg/dL — AB (ref 65–99)
PHOSPHORUS: 3.3 mg/dL (ref 2.5–4.6)
Potassium: 3 mmol/L — ABNORMAL LOW (ref 3.5–5.1)
Sodium: 148 mmol/L — ABNORMAL HIGH (ref 135–145)

## 2016-08-18 LAB — GLUCOSE, CAPILLARY
GLUCOSE-CAPILLARY: 110 mg/dL — AB (ref 65–99)
GLUCOSE-CAPILLARY: 113 mg/dL — AB (ref 65–99)
GLUCOSE-CAPILLARY: 115 mg/dL — AB (ref 65–99)
GLUCOSE-CAPILLARY: 118 mg/dL — AB (ref 65–99)
GLUCOSE-CAPILLARY: 122 mg/dL — AB (ref 65–99)
Glucose-Capillary: 108 mg/dL — ABNORMAL HIGH (ref 65–99)

## 2016-08-18 LAB — BASIC METABOLIC PANEL
ANION GAP: 9 (ref 5–15)
BUN: 74 mg/dL — AB (ref 6–20)
CALCIUM: 7.9 mg/dL — AB (ref 8.9–10.3)
CO2: 26 mmol/L (ref 22–32)
Chloride: 111 mmol/L (ref 101–111)
Creatinine, Ser: 3.45 mg/dL — ABNORMAL HIGH (ref 0.61–1.24)
GFR calc Af Amer: 23 mL/min — ABNORMAL LOW (ref >60)
GFR calc non Af Amer: 20 mL/min — ABNORMAL LOW (ref >60)
GLUCOSE: 116 mg/dL — AB (ref 65–99)
Potassium: 3.2 mmol/L — ABNORMAL LOW (ref 3.5–5.1)
Sodium: 146 mmol/L — ABNORMAL HIGH (ref 135–145)

## 2016-08-18 LAB — MAGNESIUM
Magnesium: 1.9 mg/dL (ref 1.7–2.4)
Magnesium: 1.9 mg/dL (ref 1.7–2.4)

## 2016-08-18 LAB — PROTIME-INR
INR: 1.3
PROTHROMBIN TIME: 16.3 s — AB (ref 11.4–15.2)

## 2016-08-18 LAB — APTT: aPTT: 100 seconds — ABNORMAL HIGH (ref 24–36)

## 2016-08-18 MED ORDER — POTASSIUM CHLORIDE 20 MEQ/15ML (10%) PO SOLN: 30.0000 meq | Freq: Once | ORAL | Status: DC

## 2016-08-18 MED ORDER — FUROSEMIDE 10 MG/ML IJ SOLN
40.0000 mg | Freq: Two times a day (BID) | INTRAMUSCULAR | Status: AC
Start: 2016-08-18 — End: 2016-08-19
  Administered 2016-08-18 – 2016-08-19 (×3): 40 mg via INTRAVENOUS
  Filled 2016-08-18 (×2): qty 4

## 2016-08-18 MED ORDER — IOPAMIDOL (ISOVUE-300) INJECTION 61%
INTRAVENOUS | Status: AC
  Administered 2016-08-18: 30 mL
  Filled 2016-08-18: qty 30

## 2016-08-18 MED ORDER — FENTANYL CITRATE (PF) 100 MCG/2ML IJ SOLN
25.0000 ug | INTRAMUSCULAR | Status: DC | PRN
  Administered 2016-08-20: 50 ug via INTRAVENOUS
  Administered 2016-08-21: 100 ug via INTRAVENOUS
  Filled 2016-08-18 (×2): qty 2

## 2016-08-18 MED ORDER — POTASSIUM CHLORIDE 10 MEQ/50ML IV SOLN
10.0000 meq | INTRAVENOUS | Status: AC
  Administered 2016-08-18 (×2): 10 meq via INTRAVENOUS
  Filled 2016-08-18 (×2): qty 50

## 2016-08-18 MED ORDER — SPIRONOLACTONE 25 MG PO TABS
25.0000 mg | ORAL_TABLET | Freq: Every day | ORAL | Status: DC
  Administered 2016-08-18 – 2016-08-19 (×2): 25 mg
  Filled 2016-08-18 (×3): qty 1

## 2016-08-18 NOTE — Progress Notes (Signed)
eLink Physician-Brief Progress Note Patient Name: Johnathan HolesChristopher XXXPallas DOB: 16-Apr-1970 MRN: [id number]   Date of Service  08/18/2016  HPI/Events of Note  Low K  eICU Interventions  KCl 20 meq IV     Intervention Category Major Interventions: Electrolyte abnormality - evaluation and management  YACOUB,WESAM 08/18/2016, 6:08 AM

## 2016-08-18 NOTE — Progress Notes (Signed)
Vent changes made per MD order following ABG results.

## 2016-08-18 NOTE — Progress Notes (Signed)
[facility] PULMONARY / CRITICAL CARE MEDICINE CONSULT   Name: Johnathan [name] MRN: [id number] DOB: 1970/12/10    ADMISSION DATE:  07/19/2016 CONSULTATION DATE:  07/23/2016  REFERRING MD:  Ree Kida  HISTORY OF PRESENT ILLNESS:  46 y.o. malew Hep C?, IVDA, (recently injected opana) apparently c/o fever intermittently for  1 week, and redness over the dorsum of the right foot and slight swelling. Pt notes injecting Opana about 2 days ago and cellulitis in right foot noted on admission 7/13 with labs showing EKG and abnormal LFTs with hyponatremia 123 and right lower lobe infiltrate on chest x-ray. Found to have MSSA bacteremia, possible meningitis, and LE cellulitis.  Echocardiogram Showed EF 65-03%, grade 1 diastolic dysfunction, tricuspid valve with mobile vegetation. On 7/17 He developed acute encephalopathy with agitation, becoming belligerent and combative. He denies ETOH abuse, but admitted to IVDA. He was placed on CIWA protocol, however, scores remained 19-23, patient was not responsive  to ativan, transferred to ICU for precedex gtt   SUBJECTIVE:  More diuresis yesterday. Potassium replaced. No acute events overnight. Patient with apnea during SBT.  REVIEW OF SYSTEMS:  Unable to obtain given intubation & sedation.   VITAL SIGNS: BP (!) 144/89   Pulse 61   Temp (!) 97.4 F (36.3 C) (Oral)   Resp 20   Ht _0  (1.803 m)   Wt 194 lb 10.7 oz (88.3 kg)   SpO2 98%   BMI 27.15 kg/m   HEMODYNAMICS:    VENTILATOR SETTINGS: Vent Mode: PRVC FiO2 (%):  [30 %] 30 % Set Rate:  [20 bmp] 20 bmp Vt Set:  [600 mL] 600 mL PEEP:  [5 cmH20] 5 cmH20 Plateau Pressure:  [14 cmH20-22 cmH20] 22 cmH20  INTAKE / OUTPUT: I/O last 3 completed shifts: In: 1579.4 [I.V.:806.2; NG/GT:673.2; IV Piggyback:100] Out: 5465 [Urine:3985; Emesis/NG output:250; KCLEX:5170; Chest Tube:1190]  PHYSICAL EXAMINATION: General:  No family at bedside. Comfortable. Eyes closed. Integument:  Warm. Dry. No rash on  exposed skin. HEENT:  Moist. His membranes. No scleral icterus. Endotracheal tube in place. Cardiovascular:  Anasarca particularly in the lower extremities. Regular rate. Regular rhythm. Pulmonary:  Clear to auscultation bilaterally. Symmetric chest wall rise on ventilator. Right-sided chest tube in place. Abdomen:  Protuberant. Soft. Grossly nontender. Neurological:  Open eyes with significant stimulation and nods to questions. Spontaneously moving all 4 extremities.  LABS:  BMET  Recent Labs Lab 08/17/16 0505 08/17/16 1551 08/18/16 0430  NA 144 145 148*  K 3.1* 3.2* 3.0*  CL 110 112* 115*  CO2 _1 BUN 81* 77* 74*  CREATININE 3.93* 3.79* 3.64*  GLUCOSE 131* 121* 122*    Electrolytes  Recent Labs Lab 08/16/16 0329 08/17/16 0505 08/17/16 1551 08/18/16 0430  CALCIUM 8.2* 8.0* 7.9* 7.9*  MG 1.9 1.6* 2.0 1.9  PHOS 2.8 3.0  --  3.3    CBC  Recent Labs Lab 08/16/16 0329 08/17/16 0505 08/18/16 0430  WBC 6.6 8.3 8.1  HGB 7.8* 8.2* 8.6*  HCT 23.6* 25.2* 26.5*  PLT 76* 85* 87*    Coag's  Recent Labs Lab 08/16/16 0329 08/17/16 0505 08/18/16 0430  APTT 102* 106* 100*  INR 1.33 1.25 1.30    Sepsis Markers  Recent Labs Lab 08/11/16 1800 08/12/16 0400 08/14/16 0430 08/15/16 0445  LATICACIDVEN 1.0 1.0  --   --   PROCALCITON  --   --  1.25 1.30    ABG  Recent Labs Lab 08/14/16 0353 08/15/16 0345 08/15/16 1720  PHART 7.457* 7.399  7.451*  PCO2ART 30.8* 36.6 34.1  PO2ART 117.0* 131* 108    Liver Enzymes  Recent Labs Lab 08/13/16 0420  08/17/16 0505 08/17/16 1007 08/18/16 0430  AST 20  --   --  28  --   ALT 12*  --   --  17  --   ALKPHOS 58  --   --  78  --   BILITOT 0.7  --   --  0.7  --   ALBUMIN <1.0*  <1.0*  < > 1.3* 1.3* 1.1*  < > = values in this interval not displayed.  Cardiac Enzymes No results for input(s): TROPONINI, PROBNP in the last 168 hours.  Glucose  Recent Labs Lab 08/17/16 1150 08/17/16 1545  08/17/16 2030 08/18/16 0046 08/18/16 0352 08/18/16 0745  GLUCAP 95 118* 109* 118* 122* 110*    Imaging Dg Chest Port 1 View  Result Date: 08/17/2016 CLINICAL DATA:  Acute respiratory failure with hypoxia. Nausea and vomiting. EXAM: PORTABLE CHEST 1 VIEW COMPARISON:  One-view chest x-ray 08/13/2016 FINDINGS: The endotracheal tube is stable, 3.5 cm above the carina. NG tube courses off the inferior border the film. A right-sided PICC line is in place. Heart size is normal. Aeration of both lungs is improved. Residual edema is noted. Right greater than left pleural effusions are noted. Bibasilar airspace disease likely reflects atelectasis. IMPRESSION: 1. Improved lung volumes with persistent mild edema and bibasilar airspace disease, likely atelectasis. 2. Probable small effusions, worse on the right. Electronically Signed   By: San Morelle M.D.   On: 08/17/2016 10:01   Dg Abd Portable 1v  Result Date: 08/17/2016 CLINICAL DATA:  Nausea and vomiting EXAM: PORTABLE ABDOMEN - 1 VIEW COMPARISON:  None. FINDINGS: Enteric tube tip and side port projected the expected location of the mid body of the stomach. Mild gaseous distention of the transverse colon. Otherwise, there is a paucity of bowel gas. No definite evidence of enteric obstruction. Nondiagnostic evaluation for pneumoperitoneum secondary supine positioning and exclusion of the lower thorax. No pneumatosis or portal venous gas. No definitive abnormal intra-abdominal calcifications. No acute osseus abnormalities. IMPRESSION: 1. Paucity of bowel gas without evidence of enteric obstruction. 2. Enteric tube tip and side port project over the expected location of the mid body of the stomach. Electronically Signed   By: Sandi Mariscal M.D.   On: 08/17/2016 09:57    STUDIES:  RENAL U/S 7/13:   IMPRESSION: 1. Mild right pelvicaliectasis, with ureteral jet noted in the urinary bladder excluding significant ureteral obstruction. 2. Small left  renal cyst. 3. Pleural effusions and abdominal ascites. LP 7/13:  Tube 1 - WBC 51 (62% lymph, 29% lymph, 9% mono), RBC 555, Protein 88 , & Glucose 52. / Tube 4 - WBC 180 (58% neutro, 31% lymph, 11% monocytes) & RBC 36. TTE 7/14:  LV normal in size with EF 55-60%. No regional wall motion abnormalities & grade 1 diastolic dysfunction. LA upper limits of normal in size & RA normal in size. RV normal in size and function. No aortic stenosis or regurgitation. Aortic root normal in size. No mitral stenosis or regurgitation. No pulmonic stenosis or regurgitation with poorly visualized valve. No tricuspid regurgitation. Medium sized mobile vegetation noted on tricuspid valve. No pericardial effusion. CT HEAD W/O 7/16:  Progressive atrophy since 2011.  No acute intracranial findings. No abnormal enhancement or vasogenic edema to suggest septic emboli to the brain. VENOUS DUPLEX BILATERAL LOWER EXTREMITY 7/17:  No DVT or SVT.  CTA CHEST 7/24:  IMPRESSION: 1. Negative for a pulmonary embolism. 2. Extensive pleural-parenchymal lung disease bilaterally. Large right pleural effusion and moderate-sized left pleural effusion. Pleural-based nodular opacities could represent an infectious etiology but also raise concern for a neoplastic process. Concern for a cavitary lesion and necrotic tissue in the right lung. Consider sampling of the pleural fluid. Recommend follow-up CT when the pleural fluid and acute process have resolved to exclude neoplastic disease. 3. Severe emphysematous disease. 4. Cirrhosis with evidence for portal hypertension based on the splenomegaly and ascites. 5. 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 Recommend annual imaging followup by CTA or MRA. This recommendation follows 2010 ACCF/AHA/AATS/ACR/ASA/SCA/SCAI/SIR/STS/SVM Guidelines for the Diagnosis and Management of Patients with Thoracic Aortic Disease. Circulation. 2010; 121: J287-O676 VENOUS DUPLEX LUE 7/24:  Acute  DVT involving small portion of left radial vein in mid-forearm.  CT HEAD W/O 7/26:  Normal head CT. Right Pleural Effusion 7/26:  1.5 L hazy yellow fluid by IR. Protein <3.0, Albumin <1.0, Amylase 39, Glucose 72, LDH 72, & WBC 2431 (11% lymph, 82% neutro, & 7% mono). Cytology negative for malignancy.  MRI BRAIN W/O 8/2:  Exam is limited to diffusion only. Negative for acute infarct. No area of restricted diffusion. EEG 8/2:  This EEG is abnormal due to moderate diffuse slowing of the background. Lingula BAL 8/3:  WBC 78 (39% lymph, 31% neutro, 30% mono). Reported DAH.  TTE 8/3:  LV normal in size with EF 55-60%. No regional wall motion abnormality & grade 1 diastolic dysfunction. LA & RA normal in size. RV normal in size and function. No aortic stenosis or regurgitation. Aortic root normal in size. No mitral stenosis or regurgitation. No pulmonic stenosis. Trivial regurgitation. Trivial pericardial effusion as well. Notably no vegetations were seen on this exam. Right Pleural Effusion 8/5:  WBC 616 (1% lymph, 1% eos, & 98% neutro). PORT CXR 8/5:  Previously reviewed by me. Right-sided predominant opacities. Right-sided chest tube in place. Endotracheal tube in good position. Enteric feeding tube in good position. Right upper extremity PICC line in good position. Complete Abdominal U/S 8/6: IMPRESSION: 1. Stable cirrhotic changes involving the liver without definite focal hepatic lesion. 2. Mild common bile duct dilatation without obvious common bile duct stone or intrahepatic biliary dilatation. 3. Poor visualization of the pancreas. 4. Increased echogenicity of both kidneys suggesting medical renal disease. No hydronephrosis. 5. Large volume ascites and bilateral pleural effusions. Port CXR 8/7:  Previously reviewed by me. Hazy opacities bilateral lower lung zones. Endotracheal tube & enteric feeding tube in good position. Right chest tube in good position.  PORT CXR 8/11:  Personally reviewed by  me. Patchy hazy opacities bilaterally which are improving. No appreciable residual pleural effusion on the right. Right-sided chest tube in good position. Endotracheal tube and right upper extremity central venous catheter in good position. Enteric feeding tube coursing below diaphragm. PORT ABD X-RAY 8/11: IMPRESSION: 1. Paucity of bowel gas without evidence of enteric obstruction. 2. Enteric tube tip and side port project over the expected location of the mid body of the stomach.  MICROBIOLOGY: Blood Cultures x2 7/12:  2/2 Bottles MSSA & 1/2 Bottles Pseudomonas aeruginosa  CSF Culture 7/13:  Negative  MRSA PCR 7/13:  Negative HIV 7/13:  Negative Respiratory Viral Panel PCR 7/13:  Negative  Urine Culture 7/13:  Multiple Species Present Blood Cultures x2 7/14:  1/2 Bottles MSSA Blood Culture x1 7/15:  Negative  MRSA PCR 7/17:  Negative  Urine Culture 7/17:  Negative  Urine Streptococcal Antigen 7/17:  Negative Urine Legionella Antigen 7/17:  Negative  Right Pleural Fluid Culture 7/26 >>> Bacteria Negative / AFB pending / Fungus pending Lingula BAL 8/3:  Candida tropicalis  Blood Cultures x2 8/3:  Negative  Right Pleural Culture 8/5:  Negative  Blood Culture x2 8/6:  Negative   ANTIBIOTICS: Ceftriaxone 7/13 (x1 dose) Zosyn 7/13 (x1 dose) Nafcillin 7/13 - 7/16 Cefepime 7/13 - 8/6 Vancomycin 7/13 (x1 dose); restarted 8/3 - 8/7 Tressie Ellis 8/6 >>> (plan to continue through 8/25 per ID recs)  SIGNIFICANT EVENTS: 7/17 - Transfer to ICU for precedex infusion 7/21 - Precedex off  7/25 - PCCM signed off 8/02 - Patient hypothermic & more agitated 8/03 - acute tachypnea w/ "guppy breathing" >> emergently intubated w/ bronchoscopy showing "mild diffuse alveolar hemorrhage" 8/05 - Right chest tube inserted w/ 350 mL serous fluid. Bicarb gtt started for acidosis 8/06 - Switched off Cefepime to [place] given potential for worsening encephalopathy  8/08 - Albumin w/ Lasix today. Improving UOP.   8/09 - Albumin w/ Lasix q6hr x3 doses. Precedex started in an attempt to wean Fentanyl drip.  8/11 - N/V of bilious emesis overnight & continued diuresis 8/12 - Continued diuresis w/ Lasix & added Aldactone daily. Patient apneic during SBT. Decreasing chest tube output.  LINES/TUBES: OETT 8/3 >>> R CHEST TUBE 8/5 >>> RUE DL PICC 7/27 >>> Foley >>>  ASSESSMENT / PLAN:  PULMONARY A: Acute hypoxic respiratory failure - Multifactorial.   Cavitary Pneumonia - Likely due to tricuspid valve endocarditis.  Hemoptysis/DAH - Likely due to cavitary pneumonia. Improving. No bright red blood. 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 Transudative Right Pleural Effusion - S/P Chest tube placement. 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  P: Checking ABG Continuing Atrovent nebulized every 6 hours Continue chest tube to suction Continuing daily spontaneous breathing trial and pressure support wean See ID & Heme below  Intermittent portable chest x-ray  CARDIOVASCULAR A:  Essential Hypertension:  Monitoring with diuresis.  BP stable.  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 LUE Radial Vein DVT - Seen on duplex 7/24. No PE on CTA 7/24.  P: Monitoring vitals. Protocol Continuous telemetry monitoring Previously refused transesophageal echocardiogram Holding systemic and coagulation with hemoptysis  RENAL A:   Recurrent Acute Renal Failure:  Improving. Good UOP with diuresis. Metabolic Acidosis:  Improving.  Hypokalemia:  Replaced. Hypomagnesemia:  Replaced. Hypophosphatemia :  Resolved.  P:   Lasix diuresis with 40 mg IV every 12 hours 3 doses Starting Aldactone 25 mg via tube daily KCl 54mq x4 runs Repeat BMP & Magnesium at 5pm Trending urine output with Foley catheter Continuing to trend electro lites daily Replacing electrolytes accordingly  GASTROINTESTINAL A:   New Nausea w/ Bilious Emesis: Occurred again overnight. Unclear  etiology. 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   P:   Checking abdominal CT scan today without contrast Continuing NPO Continuing daily Lactulose enemas  HEMATOLOGIC A:   Anemia:  Stable. No signs of bright red blood loss. Thrombocytopenia:  Stable. Likely due to dilution & liver cirrhosis. LUE Radial Vein DVT Coagulopathy: Stable. Likely due to cirrhosis. S/P IV Vitamin K 549m8/6.   P:  Trending cell counts daily with CBC Continuing SCDs & heparin subcutaneous every 8 hour Transfusing for Hgb <7.0 Holding systemic anticoagulation   INFECTIOUS A:   Tricuspid Valve Endocarditis:  Not seen on subsequent TTE.  MSSA & Pseudomonas  Bacteremia/Endocarditis Sepsis - Multiple etiologies.  Cavitary Pneumonia H/O Hepatitis C Viral Infection Right Lower Extremity Cellulitis - Resolved.   P:   Continuing [place] through 8/25 per ID recommendations Plan to re-culture for fever  ENDOCRINE A:   Hypoglycemia - Resolved.  P:   Continuing Accu-Checks q4hr w/ MD notification parameters   NEUROLOGIC A:   Acute Encephalopathy:  Improving on Precedex. Multifactorial. Question element of Delirium. No documented h/o EtOH use. IV Drug Use.  Sedation on Ventilator  P:   Desired RASS:  0 to -1 Repeating ABG today Continuing lactulose enema daily Continuing thiamine IV daily Discontinuing fentanyl drip  Continuing fentanyl IV as needed for pain Continuing to wean Precedex drip  FAMILY  - Updates:  Mother updated 8/9 by Dr. Ashok Cordia.   DISCUSSION:  46 y.o. male with MSSA and pseudomonal bacteremia. Continuing on broad-spectrum antibiotic coverage with Tressie Ellis per ID recommendations. Mental status is slowly improving but the etiology for patient's persistent and intermittent nausea and vomiting is unclear. Obtaining CT scan of the abdomen and pelvis today to better assess. Continuing diuresis with Lasix and Aldactone.    I have spent a total of 31 minutes of critical care time today caring for the patient and reviewing the patient's electronic medical record.   Sonia Baller Ashok Cordia, M.D. [facility] Pulmonary & Critical Care Pager:  [phone] After 3pm or if no response, call [phone] 08/18/2016, 8:14 AM

## 2016-08-18 NOTE — Progress Notes (Signed)
Pt transported on vent to CT and returned to 2M03.  Pt's vitals remained stable throughout.

## 2016-08-18 NOTE — Progress Notes (Signed)
Pt has restricted extremities wristbands on the right and left arms.  PICC line on right per RN, OK to obtain ABG using the left arm per Dr. Jamison NeighborNestor.

## 2016-08-19 ENCOUNTER — Inpatient Hospital Stay (HOSPITAL_COMMUNITY): Payer: Medicaid Other

## 2016-08-19 LAB — RENAL FUNCTION PANEL
ANION GAP: 7 (ref 5–15)
Albumin: 1.3 g/dL — ABNORMAL LOW (ref 3.5–5.0)
Albumin: 1.3 g/dL — ABNORMAL LOW (ref 3.5–5.0)
Anion gap: 8 (ref 5–15)
BUN: 69 mg/dL — AB (ref 6–20)
BUN: 66 mg/dL — ABNORMAL HIGH (ref 6–20)
CHLORIDE: 111 mmol/L (ref 101–111)
CO2: 26 mmol/L (ref 22–32)
CO2: 27 mmol/L (ref 22–32)
Calcium: 7.8 mg/dL — ABNORMAL LOW (ref 8.9–10.3)
Calcium: 7.9 mg/dL — ABNORMAL LOW (ref 8.9–10.3)
Chloride: 113 mmol/L — ABNORMAL HIGH (ref 101–111)
Creatinine, Ser: 3.15 mg/dL — ABNORMAL HIGH (ref 0.61–1.24)
Creatinine, Ser: 2.92 mg/dL — ABNORMAL HIGH (ref 0.61–1.24)
GFR calc Af Amer: 26 mL/min — ABNORMAL LOW (ref >60)
GFR calc Af Amer: 28 mL/min — ABNORMAL LOW (ref >60)
GFR calc non Af Amer: 24 mL/min — ABNORMAL LOW (ref >60)
GFR, EST NON AFRICAN AMERICAN: 22 mL/min — AB (ref >60)
GLUCOSE: 116 mg/dL — AB (ref 65–99)
Glucose, Bld: 115 mg/dL — ABNORMAL HIGH (ref 65–99)
POTASSIUM: 3.4 mmol/L — AB (ref 3.5–5.1)
POTASSIUM: 3.5 mmol/L (ref 3.5–5.1)
Phosphorus: 4.6 mg/dL (ref 2.5–4.6)
Phosphorus: 4.7 mg/dL — ABNORMAL HIGH (ref 2.5–4.6)
SODIUM: 147 mmol/L — AB (ref 135–145)
Sodium: 145 mmol/L (ref 135–145)

## 2016-08-19 LAB — GLUCOSE, CAPILLARY
GLUCOSE-CAPILLARY: 103 mg/dL — AB (ref 65–99)
GLUCOSE-CAPILLARY: 104 mg/dL — AB (ref 65–99)
GLUCOSE-CAPILLARY: 108 mg/dL — AB (ref 65–99)
Glucose-Capillary: 103 mg/dL — ABNORMAL HIGH (ref 65–99)
Glucose-Capillary: 110 mg/dL — ABNORMAL HIGH (ref 65–99)
Glucose-Capillary: 105 mg/dL — ABNORMAL HIGH (ref 65–99)
Glucose-Capillary: 101 mg/dL — ABNORMAL HIGH (ref 65–99)

## 2016-08-19 LAB — CBC WITH DIFFERENTIAL/PLATELET
BASOS ABS: 0.1 10*3/uL (ref 0.0–0.1)
Basophils Relative: 1 %
EOS ABS: 0.3 10*3/uL (ref 0.0–0.7)
Eosinophils Relative: 3 %
HEMATOCRIT: 26.8 % — AB (ref 39.0–52.0)
Hemoglobin: 8.5 g/dL — ABNORMAL LOW (ref 13.0–17.0)
LYMPHS ABS: 2.3 10*3/uL (ref 0.7–4.0)
Lymphocytes Relative: 27 %
MCH: 28 pg (ref 26.0–34.0)
MCHC: 31.7 g/dL (ref 30.0–36.0)
MCV: 88.2 fL (ref 78.0–100.0)
MONO ABS: 0.5 10*3/uL (ref 0.1–1.0)
MONOS PCT: 6 %
NEUTROS ABS: 5.4 10*3/uL (ref 1.7–7.7)
Neutrophils Relative %: 63 %
PLATELETS: 81 10*3/uL — AB (ref 150–400)
RBC: 3.04 MIL/uL — ABNORMAL LOW (ref 4.22–5.81)
RDW: 23.1 % — ABNORMAL HIGH (ref 11.5–15.5)
WBC: 8.6 10*3/uL (ref 4.0–10.5)

## 2016-08-19 LAB — PROTIME-INR
INR: 1.38
Prothrombin Time: 17 seconds — ABNORMAL HIGH (ref 11.4–15.2)

## 2016-08-19 LAB — MAGNESIUM: MAGNESIUM: 1.8 mg/dL (ref 1.7–2.4)

## 2016-08-19 LAB — APTT: aPTT: 94 seconds — ABNORMAL HIGH (ref 24–36)

## 2016-08-19 MED ORDER — POTASSIUM CHLORIDE 20 MEQ/15ML (10%) PO SOLN
40.0000 meq | Freq: Once | ORAL | Status: AC
  Administered 2016-08-19: 40 meq
  Filled 2016-08-19: qty 30

## 2016-08-19 MED ORDER — DEXTROSE 5 % IV SOLN
1.0000 g | Freq: Two times a day (BID) | INTRAVENOUS | Status: DC
  Administered 2016-08-19 – 2016-08-23 (×10): 1 g via INTRAVENOUS
  Filled 2016-08-19 (×11): qty 1

## 2016-08-19 MED ORDER — FUROSEMIDE 10 MG/ML IJ SOLN
40.0000 mg | Freq: Two times a day (BID) | INTRAMUSCULAR | Status: DC
  Administered 2016-08-19 – 2016-08-21 (×4): 40 mg via INTRAVENOUS
  Filled 2016-08-19 (×4): qty 4

## 2016-08-19 MED ORDER — LACTULOSE 10 GM/15ML PO SOLN
30.0000 g | Freq: Two times a day (BID) | ORAL | Status: DC
  Filled 2016-08-19 (×2): qty 45

## 2016-08-19 MED ORDER — POTASSIUM CHLORIDE 10 MEQ/50ML IV SOLN
10.0000 meq | INTRAVENOUS | Status: AC
  Administered 2016-08-19 (×2): 10 meq via INTRAVENOUS
  Filled 2016-08-19 (×2): qty 50

## 2016-08-19 MED ORDER — FUROSEMIDE 10 MG/ML IJ SOLN: 40.0000 mg | Freq: Two times a day (BID) | INTRAMUSCULAR | Status: DC

## 2016-08-19 MED ORDER — LACTULOSE 10 GM/15ML PO SOLN
30.0000 g | Freq: Two times a day (BID) | ORAL | Status: DC
  Administered 2016-08-20 – 2016-08-21 (×4): 30 g via ORAL
  Filled 2016-08-19 (×6): qty 45

## 2016-08-19 NOTE — Progress Notes (Signed)
[facility] PULMONARY / CRITICAL CARE MEDICINE CONSULT   Name: Johnathan [name] MRN: [id number] DOB: 1970/06/27    ADMISSION DATE:  07/19/2016 CONSULTATION DATE:  07/23/2016  REFERRING MD:  Ree Kida  HISTORY OF PRESENT ILLNESS:  46 y.o. malew Hep C?, IVDA, (recently injected opana) apparently c/o fever intermittently for  1 week, and redness over the dorsum of the right foot and slight swelling. Pt notes injecting Opana about 2 days ago and cellulitis in right foot noted on admission 7/13 with labs showing EKG and abnormal LFTs with hyponatremia 123 and right lower lobe infiltrate on chest x-ray. Found to have MSSA bacteremia, possible meningitis, and LE cellulitis.  Echocardiogram Showed EF 17-00%, grade 1 diastolic dysfunction, tricuspid valve with mobile vegetation. On 7/17 He developed acute encephalopathy with agitation, becoming belligerent and combative. He denies ETOH abuse, but admitted to IVDA. He was placed on CIWA protocol, however, scores remained 19-23, patient was not responsive  to ativan, transferred to ICU for precedex gtt   SUBJECTIVE:  -4L past 24 hours (+ 4.4 net).  Potassium being replaced.  REVIEW OF SYSTEMS:  Unable to obtain given intubation & sedation.   VITAL SIGNS: BP 138/89 (BP Location: Left Arm)   Pulse (!) 56   Temp (!) 97.4 F (36.3 C) (Oral)   Resp 16   Ht _0  (1.803 m)   Wt 85.4 kg (188 lb 4.4 oz)   SpO2 99%   BMI 26.26 kg/m   HEMODYNAMICS:    VENTILATOR SETTINGS: Vent Mode: PRVC FiO2 (%):  [30 %] 30 % Set Rate:  [12 bmp-16 bmp] 16 bmp Vt Set:  [450 mL-460 mL] 460 mL PEEP:  [5 cmH20] 5 cmH20 Plateau Pressure:  [11 cmH20-14 cmH20] 12 cmH20  INTAKE / OUTPUT: I/O last 3 completed shifts: In: 1028.6 [I.V.:961.9; NG/GT:16.7; IV Piggyback:50] Out: 8190 [FVCBS:4967; RFFMB:8466; Chest Tube:1750]  PHYSICAL EXAMINATION: General:  Adult male, no distress. Integument:  Warm. Dry. No rash on exposed skin. HEENT:  Moist. His membranes. No scleral  icterus. Endotracheal tube in place. Cardiovascular:  Anasarca particularly in the lower extremities. RRR. Pulmonary:  Clear to auscultation bilaterally. Symmetric chest wall rise on ventilator. Right-sided chest tube in place, no air leak. Abdomen:  Protuberant. Soft. Grossly nontender. Neurological:  Open eyes with significant stimulation and nods to questions. Spontaneously moving all 4 extremities.  LABS:  BMET  Recent Labs Lab 08/18/16 0430 08/18/16 1700 08/19/16 0416  NA 148* 146* 145  K 3.0* 3.2* 3.4*  CL 115* 111 111  CO2 _1 BUN 74* 74* 69*  CREATININE 3.64* 3.45* 3.15*  GLUCOSE 122* 116* 115*    Electrolytes  Recent Labs Lab 08/17/16 0505  08/18/16 0430 08/18/16 1700 08/19/16 0416  CALCIUM 8.0*  < > 7.9* 7.9* 7.8*  MG 1.6*  < > 1.9 1.9 1.8  PHOS 3.0  --  3.3  --  4.6  < > = values in this interval not displayed.  CBC  Recent Labs Lab 08/17/16 0505 08/18/16 0430 08/19/16 0416  WBC 8.3 8.1 8.6  HGB 8.2* 8.6* 8.5*  HCT 25.2* 26.5* 26.8*  PLT 85* 87* 81*    Coag's  Recent Labs Lab 08/17/16 0505 08/18/16 0430 08/19/16 0416  APTT 106* 100* 94*  INR 1.25 1.30 1.38    Sepsis Markers  Recent Labs Lab 08/14/16 0430 08/15/16 0445  PROCALCITON 1.25 1.30    ABG  Recent Labs Lab 08/15/16 1720 08/18/16 0920 08/18/16 1047  PHART 7.451* 7.525* 7.437  PCO2ART 34.1  29.0* 37.1  PO2ART 108 69.6* 81.5*    Liver Enzymes  Recent Labs Lab 08/13/16 0420  08/17/16 1007 08/18/16 0430 08/19/16 0416  AST 20  --  28  --   --   ALT 12*  --  17  --   --   ALKPHOS 58  --  78  --   --   BILITOT 0.7  --  0.7  --   --   ALBUMIN <1.0*  <1.0*  < > 1.3* 1.1* 1.3*  < > = values in this interval not displayed.  Cardiac Enzymes No results for input(s): TROPONINI, PROBNP in the last 168 hours.  Glucose  Recent Labs Lab 08/18/16 1305 08/18/16 1606 08/18/16 2041 08/19/16 0035 08/19/16 0419 08/19/16 0802  GLUCAP 115* 113* 108* 104* 103*  103*    Imaging Ct Abdomen Pelvis Wo Contrast  Result Date: 08/18/2016 CLINICAL DATA:  Abnormal liver function studies and hyponatremia. MRSA bacteremia with possible meningitis and left lower extremity cellulitis. EXAM: CT ABDOMEN AND PELVIS WITHOUT CONTRAST TECHNIQUE: Multidetector CT imaging of the abdomen and pelvis was performed following the standard protocol without IV contrast. COMPARISON:  None. FINDINGS: Lower chest: Persistent bilateral pleural effusions but slight improvement since the prior chest CT 07/30/2016. Stable changes of paraseptal emphysema. Irregular masslike area of consolidation in the right lower lobe anteriorly near the major fissure is likely an infiltrate but attention on future scans is suggested. Pulmonary infarct would be another possibility. Could not exclude a mass. New line persistent bilateral infiltrates and areas of atelectasis. Hepatobiliary: Examination limited by the lack of IV contrast and artifact from the patient's arms being down by his side. No obvious hepatic lesions. Irregular liver contour consistent with cirrhosis. There is associated splenomegaly and ascites. No intrahepatic biliary dilatation. The gallbladder appears grossly normal. No definite common bile duct dilatation. Pancreas: No obvious mass, inflammation or ductal dilatation. Spleen: Moderate splenomegaly. The spleen measures a maximum of 16.8 cm. Adrenals/Urinary Tract: The adrenal glands and kidneys are grossly normal. No renal, ureteral or bladder calculi or mass. There is a Foley catheter noted in the bladder. Stomach/Bowel: The stomach appears grossly normal. There is an NG tube in place. There appears to be fairly marked inflammations/edema involving the second and third portions of the duodenum with mucosal fold thickening. The jejunum and ileum appear grossly normal. No obvious colonic abnormality. Vascular/Lymphatic: Scattered aortic calcifications but no aneurysm. Scattered mesenteric and  retroperitoneal lymph nodes but no mass or overt adenopathy. There is diffuse mesenteric edema and moderate volume abdominal/ pelvic ascites. Reproductive: The prostate gland and seminal vesicles are grossly normal. Other: Diffuse body wall edema suggesting anasarca. Musculoskeletal: No significant bony findings. IMPRESSION: 1. Persistent bilateral pleural effusions but overall slight improvement since the prior chest CT. There are also persistent but improved bibasilar infiltrates and atelectasis. 2. 3 cm irregular rounded density in the right lower lobe anteriorly, likely infiltrate or infarct but recommend attention on future scans. 3. Right-sided chest tube in good position. Tiny anterior pneumothorax. 4. Cirrhosis with splenomegaly and ascites. No obvious hepatic lesion but exam is quite limited. 5. Diffuse body wall edema suggesting anasarca. 6. Inflammatory or infectious process involving the duodenum. No small bowel obstruction or free air. Electronically Signed   By: Marijo Sanes M.D.   On: 08/18/2016 16:11    STUDIES:  RENAL U/S 7/13:   IMPRESSION: 1. Mild right pelvicaliectasis, with ureteral jet noted in the urinary bladder excluding significant ureteral obstruction. 2. Small left renal cyst.  3. Pleural effusions and abdominal ascites. LP 7/13:  Tube 1 - WBC 51 (62% lymph, 29% lymph, 9% mono), RBC 555, Protein 88 , & Glucose 52. / Tube 4 - WBC 180 (58% neutro, 31% lymph, 11% monocytes) & RBC 36. TTE 7/14:  LV normal in size with EF 55-60%. No regional wall motion abnormalities & grade 1 diastolic dysfunction. LA upper limits of normal in size & RA normal in size. RV normal in size and function. No aortic stenosis or regurgitation. Aortic root normal in size. No mitral stenosis or regurgitation. No pulmonic stenosis or regurgitation with poorly visualized valve. No tricuspid regurgitation. Medium sized mobile vegetation noted on tricuspid valve. No pericardial effusion. CT HEAD W/O 7/16:   Progressive atrophy since 2011.  No acute intracranial findings. No abnormal enhancement or vasogenic edema to suggest septic emboli to the brain. VENOUS DUPLEX BILATERAL LOWER EXTREMITY 7/17:  No DVT or SVT.  CTA CHEST 7/24:   IMPRESSION: 1. Negative for a pulmonary embolism. 2. Extensive pleural-parenchymal lung disease bilaterally. Large right pleural effusion and moderate-sized left pleural effusion. Pleural-based nodular opacities could represent an infectious etiology but also raise concern for a neoplastic process. Concern for a cavitary lesion and necrotic tissue in the right lung. Consider sampling of the pleural fluid. Recommend follow-up CT when the pleural fluid and acute process have resolved to exclude neoplastic disease. 3. Severe emphysematous disease. 4. Cirrhosis with evidence for portal hypertension based on the splenomegaly and ascites. 5. 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 Recommend annual imaging followup by CTA or MRA. This recommendation follows 2010 ACCF/AHA/AATS/ACR/ASA/SCA/SCAI/SIR/STS/SVM Guidelines for the Diagnosis and Management of Patients with Thoracic Aortic Disease. Circulation. 2010; 121: W409-B353 VENOUS DUPLEX LUE 7/24:  Acute DVT involving small portion of left radial vein in mid-forearm.  CT HEAD W/O 7/26:  Normal head CT. Right Pleural Effusion 7/26:  1.5 L hazy yellow fluid by IR. Protein <3.0, Albumin <1.0, Amylase 39, Glucose 72, LDH 72, & WBC 2431 (11% lymph, 82% neutro, & 7% mono). Cytology negative for malignancy.  MRI BRAIN W/O 8/2:  Exam is limited to diffusion only. Negative for acute infarct. No area of restricted diffusion. EEG 8/2:  This EEG is abnormal due to moderate diffuse slowing of the background. Lingula BAL 8/3:  WBC 78 (39% lymph, 31% neutro, 30% mono). Reported DAH.  TTE 8/3:  LV normal in size with EF 55-60%. No regional wall motion abnormality & grade 1 diastolic dysfunction. LA & RA normal in size. RV  normal in size and function. No aortic stenosis or regurgitation. Aortic root normal in size. No mitral stenosis or regurgitation. No pulmonic stenosis. Trivial regurgitation. Trivial pericardial effusion as well. Notably no vegetations were seen on this exam. Right Pleural Effusion 8/5:  WBC 616 (1% lymph, 1% eos, & 98% neutro). PORT CXR 8/5:  Previously reviewed by me. Right-sided predominant opacities. Right-sided chest tube in place. Endotracheal tube in good position. Enteric feeding tube in good position. Right upper extremity PICC line in good position. Complete Abdominal U/S 8/6: IMPRESSION: 1. Stable cirrhotic changes involving the liver without definite focal hepatic lesion. 2. Mild common bile duct dilatation without obvious common bile duct stone or intrahepatic biliary dilatation. 3. Poor visualization of the pancreas. 4. Increased echogenicity of both kidneys suggesting medical renal disease. No hydronephrosis. 5. Large volume ascites and bilateral pleural effusions. Port CXR 8/7:  Previously reviewed by me. Hazy opacities bilateral lower lung zones. Endotracheal tube & enteric feeding tube  in good position. Right chest tube in good position.  PORT CXR 8/11:  Personally reviewed by me. Patchy hazy opacities bilaterally which are improving. No appreciable residual pleural effusion on the right. Right-sided chest tube in good position. Endotracheal tube and right upper extremity central venous catheter in good position. Enteric feeding tube coursing below diaphragm. PORT ABD X-RAY 8/11: IMPRESSION: 1. Paucity of bowel gas without evidence of enteric obstruction. 2. Enteric tube tip and side port project over the expected location of the mid body of the stomach. CT abd / pelv 8/12 > persistent bilateral pleural effusion (though improved), 3cm irregular rounded density in RLL.  Cirrhosis with splenomegaly and ascites, anasarca. Inflammatory vs infectious process involving  duodenum.  MICROBIOLOGY: Blood Cultures x2 7/12:  2/2 Bottles MSSA & 1/2 Bottles Pseudomonas aeruginosa  CSF Culture 7/13:  Negative  MRSA PCR 7/13:  Negative HIV 7/13:  Negative Respiratory Viral Panel PCR 7/13:  Negative  Urine Culture 7/13:  Multiple Species Present Blood Cultures x2 7/14:  1/2 Bottles MSSA Blood Culture x1 7/15:  Negative  MRSA PCR 7/17:  Negative  Urine Culture 7/17:  Negative  Urine Streptococcal Antigen 7/17:  Negative Urine Legionella Antigen 7/17:  Negative  Right Pleural Fluid Culture 7/26 >>> Bacteria Negative / AFB pending / Fungus pending Lingula BAL 8/3:  Candida tropicalis  Blood Cultures x2 8/3:  Negative  Right Pleural Culture 8/5:  Negative  Blood Culture x2 8/6:  Negative   ANTIBIOTICS: Ceftriaxone 7/13 (x1 dose) Zosyn 7/13 (x1 dose) Nafcillin 7/13 - 7/16 Cefepime 7/13 - 8/6 Vancomycin 7/13 (x1 dose); restarted 8/3 - 8/7 Tressie Ellis 8/6 >>> (plan to continue through 8/25 per ID recs)  SIGNIFICANT EVENTS: 7/17 - Transfer to ICU for precedex infusion 7/21 - Precedex off  7/25 - PCCM signed off 8/02 - Patient hypothermic & more agitated 8/03 - acute tachypnea w/ "guppy breathing" >> emergently intubated w/ bronchoscopy showing "mild diffuse alveolar hemorrhage" 8/05 - Right chest tube inserted w/ 350 mL serous fluid. Bicarb gtt started for acidosis 8/06 - Switched off Cefepime to [place] given potential for worsening encephalopathy  8/08 - Albumin w/ Lasix today. Improving UOP.  8/09 - Albumin w/ Lasix q6hr x3 doses. Precedex started in an attempt to wean Fentanyl drip.  8/11 - N/V of bilious emesis overnight & continued diuresis 8/12 - Continued diuresis w/ Lasix & added Aldactone daily. Patient apneic during SBT. Decreasing chest tube output.  LINES/TUBES: OETT 8/3 >>> R CHEST TUBE 8/5 >>> RUE DL PICC 7/27 >>> Foley >>>  ASSESSMENT / PLAN:  PULMONARY A: Acute hypoxic respiratory failure - Multifactorial.   Cavitary Pneumonia - Likely  due to tricuspid valve endocarditis.  Hemoptysis/DAH - Likely due to cavitary pneumonia. Improving. No bright red blood. 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 Transudative Right Pleural Effusion - S/P Chest tube placement. 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 P: Continuing Atrovent nebulized every 6 hours Continue chest tube to suction Has not tolerated SBT's due to apnea Will need trach See ID & Heme below  Follow CXR  CARDIOVASCULAR A:  Essential Hypertension:  Monitoring with diuresis.  BP stable.  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 LUE Radial Vein DVT - Seen on duplex 7/24. No PE on CTA 7/24. P: Continuous telemetry monitoring Previously refused transesophageal echocardiogram Holding systemic and coagulation with hemoptysis Continue diuresis  RENAL A:   Recurrent Acute Renal Failure:  Improving. Good UOP with diuresis. Metabolic Acidosis:  Improved. Hypokalemia:  Replaced. Hypomagnesemia:  Replaced.  Hypophosphatemia :  Resolved. P:   Continue furosemide + aldactone  Trending urine output with Foley catheter Replacing electrolytes accordingly Follow BMP  GASTROINTESTINAL A:   New Nausea w/ Bilious Emesis: Occurred again overnight, ? Duodenitis.  Resolved AM 8/13. 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  P:   Continuing NPO Continuing daily Lactulose enemas Continue TF's  HEMATOLOGIC A:   Anemia:  Stable. No signs of bright red blood loss. Thrombocytopenia:  Stable. Likely due to dilution & liver cirrhosis. LUE Radial Vein DVT Coagulopathy: Stable. Likely due to cirrhosis. S/P IV Vitamin K 70m 8/6.  P:  Trending cell counts daily with CBC Continuing SCDs & heparin subcutaneous every 8 hour Transfusing for Hgb <7.0 Holding systemic anticoagulation due to hemoptysis  INFECTIOUS A:   Tricuspid Valve Endocarditis:  Not seen on subsequent TTE.   MSSA & Pseudomonas Bacteremia/Endocarditis Sepsis - Multiple etiologies.  Cavitary Pneumonia H/O Hepatitis C Viral Infection Right Lower Extremity Cellulitis - Resolved.  P:   Continuing [place] through 8/25 per ID recommendations Follow cultures  ENDOCRINE A:   Hypoglycemia - Resolved. P:   Continuing Accu-Checks q4hr w/ MD notification parameters   NEUROLOGIC A:   Acute Encephalopathy:  Improving on Precedex. Multifactorial. Question element of Delirium. No documented h/o EtOH use. IV Drug Use.  Sedation on Ventilator P:   Continue precedex gtt with PRN fentanyl Desired RASS:  0 to -1 Continuing lactulose enema daily Continuing thiamine IV daily   FAMILY  - Updates:  Mother updated 8/9 by Dr. NAshok Cordia   DISCUSSION:  46y.o. male with MSSA and pseudomonal bacteremia. Continuing on broad-spectrum antibiotic coverage with FTressie Ellisper ID recommendations. Mental status is slowly improving but the etiology for patient's persistent and intermittent nausea and vomiting is unclear. Obtaining CT scan of the abdomen and pelvis today to better assess. Continuing diuresis with Lasix and Aldactone.     RMontey Hora PSag HarborPulmonary & Critical Care Medicine Pager: [phone] or ([phone]/13/2018, 9:41 AM   STAFF NOTE: I, DMerrie Roof MD FACP have personally reviewed patient's available data, including medical history, events of note, physical examination and test results as part of my evaluation. I have discussed with resident/NP and other care providers such as pharmacist, RN and RRT. In addition, I personally evaluated patient and elicited key findings of: awake, FC, on wean, lungs anterior clear, reduced bases, abdo soft min fluid level, edema noted, was neg 4.4 liters last 24 hours, pcxr which I reviewed shows residual bibasilar ATX, / effusion, with chest tube in place, goal is weaning today cpap 5/ ps 5, goal 1 hour, assess rsbi, has had some apnea which  may facilitate need trach, would prefer to assess wearing for anther 48 hours and possible extubation if able, if unable then trach wed, goal is to maintain neg balance but consider lower lasix to neg 2 liters  And follow chest tube output, likely in am if no sig PTX on pcxr then go to water seal on chest tube, ABX per ID, pcxr to follow, advance TF has some ileus - for post pyloric feeding, follow plat furtehr iof trach needed may need Tx, lactulose to oral, no fam in room The patient is critically ill with multiple organ systems failure and requires high complexity decision making for assessment and support, frequent evaluation and titration of therapies, application of advanced monitoring technologies and  extensive interpretation of multiple databases.   Critical Care Time devoted to patient care services described in this note is 30 Minutes. This time reflects time of care of this signee: Merrie Roof, MD FACP. This critical care time does not reflect procedure time, or teaching time or supervisory time of PA/NP/Med student/Med Resident etc but could involve care discussion time. Rest per NP/medical resident whose note is outlined above and that I agree with   [name]. Titus Mould, MD, Frannie Pgr: [facility] 08/19/2016 11:50 AM

## 2016-08-19 NOTE — Procedures (Signed)
Extubation Procedure Note  Patient Details:   Name: Johnathan [name] DOB: 09-04-1970 MRN: [id number]   Airway Documentation:     Evaluation  O2 sats: stable throughout Complications: No apparent complications Patient did tolerate procedure well. Bilateral Breath Sounds: Clear, Diminished   Yes   Positive cuff leak noted.  Pt placed on 2L [place] with humidity, no stridor noted, pt able to reach 1250 mL using incentive spirometer.  Johnathan [name] 08/19/2016, 12:50 PM

## 2016-08-19 NOTE — Progress Notes (Signed)
eLink Physician-Brief Progress Note Patient Name: Johnathan [name] DOB: [birth year] MRN: [id number]   Date of Service  08/19/2016  HPI/Events of Note    eICU Interventions  Hypokalemia -repleted      Intervention Category Intermediate Interventions: Electrolyte abnormality - evaluation and management  Kaleigha Chamberlin V. 08/19/2016, 12:02 AM

## 2016-08-19 NOTE — Progress Notes (Signed)
Pharmacy Antibiotic Note  Johnathan [name] is a 46 year old male with hx Hep C, IVDA who presented on 7/13 with R-foot redness/swelling with fevers. The patient is noted to have MSSA + Psuedomonas bacteremia with TTE positive for tricuspid valve IE. ID recommends treatment through the 25th..   WBC wnl, afebrile, and renal function is improving. With the change in renal function, the ceftazidime will need to be dose adjusted accordingly.  Plan: Start ceftazidime 1gm IV Q12H with end date 8/25 Monitor clinical status, renal function    Height: _0  (180.3 cm) Weight: 188 lb 4.4 oz (85.4 kg) IBW/kg (Calculated) : 75.3  Temp (24hrs), Avg:97.4 F (36.3 C), Min:97.4 F (36.3 C), Max:97.4 F (36.3 C)   Recent Labs Lab 08/13/16 0420  08/15/16 0445  08/16/16 0329 08/17/16 0505 08/17/16 1551 08/18/16 0430 08/18/16 1700 08/19/16 0416  WBC 6.0  < > 5.7  --  6.6 8.3  --  8.1  --  8.6  CREATININE 4.43*  < > 4.56*  < > 4.39* 3.93* 3.79* 3.64* 3.45* 3.15*  VANCORANDOM 24  --   --   --   --   --   --   --   --   --   < > = values in this interval not displayed.    Antibiotics on this admission: Zosyn 7/13 >> 7/13 Vanc 7/13 >> 7/13; 8/3 >>8/7 Cefepime 7/13 >>8/6 Nafcillin 7/13 >> 7/16 Ceftazidime 8/6>> [8/25]  Dose Adjustments made 08/13 Ceftazidime 2gm Q24h changed to 1gm Q12h due to improved renal function  Cultures: 7/12 BCx - MSSA + PSA (pan sensitive) 7/13 CSF cx - negative 7/13 RVP- negative 7/13 MRSA PCR - negative 7/13 UCx - negative 7/14 BCx - 1/2 MSSA 7/15 BCx - neg 7/17 Urine- neg 7/26 pleural fluid- gram stain neg, cx ngF 7/26 AFB- neg 7/26 Fungal:neg 8/3 blood cx: pending 8/3 BAL: NGTD 8/3 Blood - [place] PharmD PGY1 Pharmacy Practice Resident 08/19/2016 5:08 AM Pager: [phone]

## 2016-08-20 ENCOUNTER — Inpatient Hospital Stay (HOSPITAL_COMMUNITY): Payer: Medicaid Other

## 2016-08-20 DIAGNOSIS — L8990 Pressure ulcer of unspecified site, unspecified stage: Secondary | ICD-10-CM | POA: Insufficient documentation

## 2016-08-20 LAB — GLUCOSE, CAPILLARY
GLUCOSE-CAPILLARY: 123 mg/dL — AB (ref 65–99)
Glucose-Capillary: 99 mg/dL (ref 65–99)
Glucose-Capillary: 119 mg/dL — ABNORMAL HIGH (ref 65–99)
Glucose-Capillary: 84 mg/dL (ref 65–99)
Glucose-Capillary: 85 mg/dL (ref 65–99)

## 2016-08-20 LAB — RENAL FUNCTION PANEL
ALBUMIN: 1.3 g/dL — AB (ref 3.5–5.0)
ALBUMIN: 1.4 g/dL — AB (ref 3.5–5.0)
ANION GAP: 9 (ref 5–15)
Anion gap: 8 (ref 5–15)
BUN: 58 mg/dL — ABNORMAL HIGH (ref 6–20)
BUN: 54 mg/dL — ABNORMAL HIGH (ref 6–20)
CALCIUM: 7.8 mg/dL — AB (ref 8.9–10.3)
CALCIUM: 7.8 mg/dL — AB (ref 8.9–10.3)
CO2: 27 mmol/L (ref 22–32)
CO2: 27 mmol/L (ref 22–32)
CREATININE: 2.65 mg/dL — AB (ref 0.61–1.24)
Chloride: 113 mmol/L — ABNORMAL HIGH (ref 101–111)
Chloride: 114 mmol/L — ABNORMAL HIGH (ref 101–111)
Creatinine, Ser: 2.4 mg/dL — ABNORMAL HIGH (ref 0.61–1.24)
GFR calc non Af Amer: 31 mL/min — ABNORMAL LOW (ref >60)
GFR, EST AFRICAN AMERICAN: 32 mL/min — AB (ref >60)
GFR, EST AFRICAN AMERICAN: 36 mL/min — AB (ref >60)
GFR, EST NON AFRICAN AMERICAN: 27 mL/min — AB (ref >60)
GLUCOSE: 88 mg/dL (ref 65–99)
Glucose, Bld: 122 mg/dL — ABNORMAL HIGH (ref 65–99)
PHOSPHORUS: 4.4 mg/dL (ref 2.5–4.6)
POTASSIUM: 3.1 mmol/L — AB (ref 3.5–5.1)
Phosphorus: 4.7 mg/dL — ABNORMAL HIGH (ref 2.5–4.6)
Potassium: 3.2 mmol/L — ABNORMAL LOW (ref 3.5–5.1)
SODIUM: 148 mmol/L — AB (ref 135–145)
Sodium: 150 mmol/L — ABNORMAL HIGH (ref 135–145)

## 2016-08-20 LAB — CBC WITH DIFFERENTIAL/PLATELET
BASOS PCT: 0 %
Basophils Absolute: 0 10*3/uL (ref 0.0–0.1)
EOS PCT: 3 %
Eosinophils Absolute: 0.3 10*3/uL (ref 0.0–0.7)
HEMATOCRIT: 26.6 % — AB (ref 39.0–52.0)
HEMOGLOBIN: 8.4 g/dL — AB (ref 13.0–17.0)
LYMPHS PCT: 28 %
Lymphs Abs: 2.3 10*3/uL (ref 0.7–4.0)
MCH: 28 pg (ref 26.0–34.0)
MCHC: 31.6 g/dL (ref 30.0–36.0)
MCV: 88.7 fL (ref 78.0–100.0)
MONOS PCT: 6 %
Monocytes Absolute: 0.5 10*3/uL (ref 0.1–1.0)
NEUTROS PCT: 63 %
Neutro Abs: 5.2 10*3/uL (ref 1.7–7.7)
Platelets: 90 10*3/uL — ABNORMAL LOW (ref 150–400)
RBC: 3 MIL/uL — ABNORMAL LOW (ref 4.22–5.81)
RDW: 22.9 % — ABNORMAL HIGH (ref 11.5–15.5)
WBC: 8.3 10*3/uL (ref 4.0–10.5)

## 2016-08-20 LAB — PROTIME-INR
INR: 1.34
PROTHROMBIN TIME: 16.6 s — AB (ref 11.4–15.2)

## 2016-08-20 LAB — MAGNESIUM: Magnesium: 1.7 mg/dL (ref 1.7–2.4)

## 2016-08-20 LAB — APTT: aPTT: 103 seconds — ABNORMAL HIGH (ref 24–36)

## 2016-08-20 LAB — PHOSPHORUS: PHOSPHORUS: 4.7 mg/dL — AB (ref 2.5–4.6)

## 2016-08-20 MED ORDER — PANTOPRAZOLE SODIUM 40 MG PO TBEC
40.0000 mg | DELAYED_RELEASE_TABLET | Freq: Every day | ORAL | Status: DC
  Administered 2016-08-20 – 2016-09-03 (×14): 40 mg via ORAL
  Filled 2016-08-20 (×15): qty 1

## 2016-08-20 MED ORDER — LORAZEPAM 2 MG/ML IJ SOLN
1.0000 mg | Freq: Three times a day (TID) | INTRAMUSCULAR | Status: DC
  Administered 2016-08-20: 1 mg via INTRAVENOUS
  Filled 2016-08-20 (×2): qty 1

## 2016-08-20 MED ORDER — MIDAZOLAM HCL 2 MG/2ML IJ SOLN
1.0000 mg | INTRAMUSCULAR | Status: DC | PRN
Start: 2016-08-20 — End: 2016-08-20
  Administered 2016-08-20: 1 mg via INTRAVENOUS
  Filled 2016-08-20: qty 2

## 2016-08-20 MED ORDER — SPIRONOLACTONE 25 MG PO TABS
25.0000 mg | ORAL_TABLET | Freq: Every day | ORAL | Status: DC
  Administered 2016-08-20 – 2016-08-27 (×7): 25 mg via ORAL
  Filled 2016-08-20 (×8): qty 1

## 2016-08-20 MED ORDER — VITAMIN B-1 100 MG PO TABS
100.0000 mg | ORAL_TABLET | Freq: Every day | ORAL | Status: DC
  Administered 2016-08-20 – 2016-09-03 (×14): 100 mg via ORAL
  Filled 2016-08-20 (×15): qty 1

## 2016-08-20 MED ORDER — PANTOPRAZOLE SODIUM 40 MG PO PACK
40.0000 mg | PACK | Freq: Every day | ORAL | Status: DC
  Filled 2016-08-20: qty 20

## 2016-08-20 MED ORDER — METOCLOPRAMIDE HCL 5 MG/ML IJ SOLN
5.0000 mg | Freq: Once | INTRAMUSCULAR | Status: AC
  Administered 2016-08-20: 5 mg via INTRAVENOUS
  Filled 2016-08-20: qty 1

## 2016-08-20 MED ORDER — LORAZEPAM 2 MG/ML IJ SOLN: 1.0000 mg | INTRAMUSCULAR | Status: DC | PRN

## 2016-08-20 MED ORDER — FOLIC ACID 1 MG PO TABS
1.0000 mg | ORAL_TABLET | Freq: Every day | ORAL | Status: DC
  Administered 2016-08-20 – 2016-09-03 (×14): 1 mg via ORAL
  Filled 2016-08-20 (×15): qty 1

## 2016-08-20 MED ORDER — LORAZEPAM 2 MG/ML IJ SOLN
1.0000 mg | Freq: Four times a day (QID) | INTRAMUSCULAR | Status: DC
  Administered 2016-08-20 – 2016-08-21 (×3): 1 mg via INTRAVENOUS
  Filled 2016-08-20 (×3): qty 1

## 2016-08-20 MED ORDER — MIDAZOLAM HCL 2 MG/2ML IJ SOLN
1.0000 mg | INTRAMUSCULAR | Status: DC | PRN
  Administered 2016-08-21 – 2016-08-24 (×8): 1 mg via INTRAVENOUS
  Filled 2016-08-20 (×9): qty 2

## 2016-08-20 MED ORDER — FOLIC ACID 5 MG/ML IJ SOLN
1.0000 mg | Freq: Every day | INTRAMUSCULAR | Status: DC
  Filled 2016-08-20: qty 0.2

## 2016-08-20 MED ORDER — POTASSIUM CHLORIDE 10 MEQ/50ML IV SOLN
10.0000 meq | INTRAVENOUS | Status: AC
  Administered 2016-08-20 – 2016-08-21 (×4): 10 meq via INTRAVENOUS
  Filled 2016-08-20 (×4): qty 50

## 2016-08-20 MED ORDER — SODIUM CHLORIDE 0.9 % IV SOLN
0.4000 ug/kg/h | INTRAVENOUS | Status: DC
  Filled 2016-08-20: qty 2

## 2016-08-20 MED ORDER — POTASSIUM CHLORIDE 10 MEQ/50ML IV SOLN
10.0000 meq | INTRAVENOUS | Status: AC
  Administered 2016-08-20 (×3): 10 meq via INTRAVENOUS
  Filled 2016-08-20 (×2): qty 50

## 2016-08-20 MED ORDER — MAGNESIUM SULFATE IN D5W 1-5 GM/100ML-% IV SOLN
1.0000 g | Freq: Once | INTRAVENOUS | Status: AC
  Administered 2016-08-20: 1 g via INTRAVENOUS
  Filled 2016-08-20: qty 100

## 2016-08-20 NOTE — Progress Notes (Signed)
Physical Therapy Treatment Patient Details Name: Johnathan [name] MRN: [id number] DOB: September 05, 1970 Today's Date: 08/20/2016    History of Present Illness Pt admitted 7/13 with sepsis, MSSA bacteremia, B LE celllulitis, tricuspid valve vegetation, R LL cavity PNA with pleural effusion, hep C. Developed encephalopathy and agitation and transferred to ICU 7/17. + DVT LUE. Intubated 8/3-8/13; s/p CT placement 8/5 secondary to effusion. PMH: polysubstance abuse.    PT Comments    Patient alert and conversant today. Tolerated standing and side stepping along side bed with Mod A of 2 for balance/safety due to weakness and posterior bias. Pt still demonstrates cognitive deficits -impaired attention, safety/judgment and problem solving. Anticipate pt will be ready to initiate gait training next session. Goals updated. Will continue to follow and progress as tolerated.    Follow Up Recommendations  SNF     Equipment Recommendations  Other (comment) (defer to next venue)    Recommendations for Other Services Speech consult (cognitive assessment)     Precautions / Restrictions Precautions Precautions: Fall Precaution Comments: chest tube, flexiseal, foley Restrictions Weight Bearing Restrictions: No    Mobility  Bed Mobility Overal bed mobility: Needs Assistance Bed Mobility: Supine to Sit;Sit to Supine     Supine to sit: Mod assist;+2 for physical assistance Sit to supine: Mod assist   General bed mobility comments: assist for LEs off bed and to raise trunk and advance hips to EOB, moderate assistance for LEs back into bed.  Transfers Overall transfer level: Needs assistance Equipment used: 2 person hand held assist Transfers: Sit to/from Stand Sit to Stand: +2 physical assistance;Mod assist         General transfer comment: stood from bed with assist to rise and for balance with R knee blocked, pt with posterior bias.   Ambulation/Gait Ambulation/Gait assistance: Mod  assist;+2 physical assistance Ambulation Distance (Feet): 4 Feet Assistive device: 2 person hand held assist Gait Pattern/deviations: Step-to pattern;Narrow base of support;Leaning posteriorly Gait velocity: decreased   General Gait Details: Able to side step along side bed with handheld assist x2 with posterior bias.    Stairs            Wheelchair Mobility    Modified Rankin (Stroke Patients Only) Modified Rankin (Stroke Patients Only) Pre-Morbid Rankin Score: No symptoms Modified Rankin: Moderately severe disability     Balance Overall balance assessment: Needs assistance Sitting-balance support: Feet supported;No upper extremity supported Sitting balance-Leahy Scale: Fair Sitting balance - Comments: statically while washing face; demonstrates decent trunk control leaning slightly posteriorly and able to lean forward without assist.  Postural control: Posterior lean Standing balance support: Bilateral upper extremity supported;During functional activity Standing balance-Leahy Scale: Poor Standing balance comment: posterior lean, stood several minutes with therapists.                             Cognition Arousal/Alertness: Awake/alert Behavior During Therapy: Flat affect Overall Cognitive Status: Impaired/Different from baseline Area of Impairment: Attention;Orientation;Memory;Following commands;Safety/judgement;Problem solving                 Orientation Level: Disoriented to;Time;Situation Current Attention Level: Selective Memory: Decreased short-term memory Following Commands: Follows one step commands with increased time Safety/Judgement: Decreased awareness of safety;Decreased awareness of deficits   Problem Solving: Slow processing;Decreased initiation;Difficulty sequencing;Requires verbal cues;Requires tactile cues General Comments: Pt's speech intelligible today; shocked he has been in hospital for 1 month. Repetition required for  following commands.       Exercises  General Exercises - Lower Extremity Long Arc Quad: Both;5 reps;Seated    General Comments General comments (skin integrity, edema, etc.): BP elevated 138/118 supine, 141/104 sitting EOB. RR up to 40.      Pertinent Vitals/Pain Pain Assessment: Faces Faces Pain Scale: No hurt    Home Living Family/patient expects to be discharged to:: Private residence                    Prior Function Level of Independence: Independent          PT Goals (current goals can now be found in the care plan section) Acute Rehab PT Goals Patient Stated Goal: none stated Progress towards PT goals: Progressing toward goals    Frequency    Min 2X/week      PT Plan Discharge plan needs to be updated    Co-evaluation PT/OT/SLP Co-Evaluation/Treatment: Yes Reason for Co-Treatment: Necessary to address cognition/behavior during functional activity;For patient/therapist safety;Complexity of the patient's impairments (multi-system involvement) PT goals addressed during session: Mobility/safety with mobility;Balance OT goals addressed during session: ADL's and self-care      AM-PAC PT "6 Clicks" Daily Activity  Outcome Measure  Difficulty turning over in bed (including adjusting bedclothes, sheets and blankets)?: None Difficulty moving from lying on back to sitting on the side of the bed? : Total Difficulty sitting down on and standing up from a chair with arms (e.g., wheelchair, bedside commode, etc,.)?: Total Help needed moving to and from a bed to chair (including a wheelchair)?: A Lot Help needed walking in hospital room?: A Lot Help needed climbing 3-5 steps with a railing? : Total 6 Click Score: 11    End of Session Equipment Utilized During Treatment: Gait belt Activity Tolerance: Patient tolerated treatment well Patient left: in bed;with call bell/phone within reach;with bed alarm set Nurse Communication: Mobility status PT Visit  Diagnosis: Unsteadiness on feet (R26.81);Other symptoms and signs involving the nervous system (R29.898);Muscle weakness (generalized) (M62.81)     Time: 4098-11911108-1131 PT Time Calculation (min) (ACUTE ONLY): 23 min  Charges:  $Therapeutic Activity: 8-22 mins                    G Codes:       Mylo RedShauna Sabino Denning, PT, DPT [phone]     Blake DivineShauna A Korey Prashad 08/20/2016, 11:57 AM

## 2016-08-20 NOTE — Progress Notes (Signed)
PCCM Attending Note: RN noted patient with mild hematuria. PTT elevated. D/C Heparin subcutaneous.   Donna ChristenJennings E. Jamison NeighborNestor, M.D. [facility] Pager:  [phone] After 3pm or if no response, call [phone] 1:49 PM 08/20/16

## 2016-08-20 NOTE — Evaluation (Addendum)
Occupational Therapy Evaluation  Patient Details Name: Johnathan [name] MRN: [id number] DOB: 28-Apr-1970 Today's Date: 08/20/2016    History of Present Illness Pt admitted 7/13 with sepsis, MSSA bacteremia, B LE celllulitis, tricuspid valve vegetation, R LL cavity PNA with pleural effusion, hep C. Developed encephalopathy and agitation and transferred to ICU 7/17. + DVT LUE. Intubated 8/3-8/13; s/p CT placement 8/5 secondary to effusion. PMH: polysubstance abuse.   Clinical Impression   Pt was independent at baseline. Presents with generalized weakness, impaired cognition, decreased activity tolerance and poor standing balance. Pt requires extensive assistance for ADL and 2 person assist for OOB. Will follow acutely. Recommending post acute rehab.    Follow Up Recommendations  SNF;Supervision/Assistance - 24 hour    Equipment Recommendations       Recommendations for Other Services       Precautions / Restrictions Precautions Precautions: Fall Precaution Comments: chest tube, flexiseal, foley Restrictions Weight Bearing Restrictions: No      Mobility Bed Mobility Overal bed mobility: Needs Assistance Bed Mobility: Supine to Sit;Sit to Supine     Supine to sit: Mod assist;+2 for physical assistance Sit to supine: Mod assist   General bed mobility comments: assist for LEs off bed and to raise trunk and advance hips to EOB, moderate assistance for LEs back into bed.  Transfers Overall transfer level: Needs assistance Equipment used: 2 person hand held assist Transfers: Sit to/from Stand Sit to Stand: +2 physical assistance;Mod assist         General transfer comment: stood from bed with assist to rise and for balance with R knee blocked, pt with posterior bias    Balance Overall balance assessment: Needs assistance   Sitting balance-Leahy Scale: Fair Sitting balance - Comments: statically while washing face   Standing balance support: Bilateral upper  extremity supported Standing balance-Leahy Scale: Poor Standing balance comment: posterior lean, stood several minutes and took steps toward [facility]                           ADL either performed or assessed with clinical judgement   ADL Overall ADL's : Needs assistance/impaired     Grooming: Wash/dry hands;Wash/dry face;Sitting;Min guard   Upper Body Bathing: Sitting;Maximal assistance   Lower Body Bathing: Total assistance;+2 for physical assistance;Sit to/from stand   Upper Body Dressing : Moderate assistance;Sitting   Lower Body Dressing: Total assistance;+2 for physical assistance;Sit to/from stand;Bed level   Toilet Transfer: +2 for physical assistance;Moderate assistance   Toileting- Clothing Manipulation and Hygiene: Total assistance;+2 for physical assistance;Sit to/from stand         General ADL Comments: Pt stood for several minutes from EOB and took several side steps to [facility] with plus 2 assistance. Pt making inappropriate sexual comments.     Vision   Additional Comments: no apparent vision problems     Perception     Praxis      Pertinent Vitals/Pain Pain Assessment: Faces Faces Pain Scale: No hurt     Hand Dominance Right   Extremity/Trunk Assessment Upper Extremity Assessment Upper Extremity Assessment: Generalized weakness   Lower Extremity Assessment Lower Extremity Assessment: Defer to PT evaluation       Communication Communication Communication: No difficulties   Cognition Arousal/Alertness: Awake/alert Behavior During Therapy: Flat affect Overall Cognitive Status: Impaired/Different from baseline Area of Impairment: Attention;Orientation;Memory;Following commands;Safety/judgement;Problem solving                 Orientation Level: Disoriented to;Time;Situation Current  Attention Level: Sustained Memory: Decreased short-term memory Following Commands: Follows one step commands with increased time Safety/Judgement:  Decreased awareness of safety;Decreased awareness of deficits   Problem Solving: Slow processing;Decreased initiation;Difficulty sequencing;Requires verbal cues;Requires tactile cues     General Comments       Exercises     Shoulder Instructions      Home Living Family/patient expects to be discharged to:: Private residence                                        Prior Functioning/Environment Level of Independence: Independent                 OT Problem List: Decreased activity tolerance;Decreased strength;Decreased cognition;Decreased coordination;Impaired balance (sitting and/or standing);Decreased safety awareness;Impaired UE functional use;Cardiopulmonary status limiting activity;Decreased knowledge of use of DME or AE      OT Treatment/Interventions: Self-care/ADL training;DME and/or AE instruction;Patient/family education;Balance training;Cognitive remediation/compensation;Therapeutic activities    OT Goals(Current goals can be found in the care plan section) Acute Rehab OT Goals Patient Stated Goal: none stated OT Goal Formulation: Patient unable to participate in goal setting Time For Goal Achievement: 09/03/16 Potential to Achieve Goals: Good ADL Goals Pt Will Perform Eating: with supervision;sitting Pt Will Perform Grooming: with supervision;sitting Pt Will Transfer to Toilet: ambulating;with mod assist;bedside commode Additional ADL Goal #1: Pt will use calendar to determine date with minimal verbal cues. Additional ADL Goal #2: Pt will demonstrate selective attention during ADL. Additional ADL Goal #3: Pt will perform bed mobility with min guard assist.  OT Frequency: Min 2X/week   Barriers to D/C:            Co-evaluation PT/OT/SLP Co-Evaluation/Treatment: Yes Reason for Co-Treatment: Necessary to address cognition/behavior during functional activity;For patient/therapist safety;Complexity of the patient's impairments (multi-system  involvement) PT goals addressed during session: Mobility/safety with mobility;Balance OT goals addressed during session: ADL's and self-care      AM-PAC PT "6 Clicks" Daily Activity     Outcome Measure Help from another person eating meals?: Total Help from another person taking care of personal grooming?: A Lot Help from another person toileting, which includes using toliet, bedpan, or urinal?: Total Help from another person bathing (including washing, rinsing, drying)?: A Lot Help from another person to put on and taking off regular upper body clothing?: A Lot Help from another person to put on and taking off regular lower body clothing?: Total 6 Click Score: 9   End of Session Equipment Utilized During Treatment: Gait belt  Activity Tolerance: Patient tolerated treatment well Patient left: in bed;with call bell/phone within reach;with bed alarm set  OT Visit Diagnosis: Muscle weakness (generalized) (M62.81);Other symptoms and signs involving cognitive function                Time: [id number] OT Time Calculation (min): 23 min Charges:  OT General Charges $OT Visit: 1 Procedure OT Evaluation $OT Re-eval: 1 Procedure G-Codes:      Evern BioMayberry, Aryana Wonnacott Lynn 08/20/2016, 11:53 AM  [phone]

## 2016-08-20 NOTE — Progress Notes (Signed)
eLink Physician-Brief Progress Note Patient Name: Johnathan HolesChristopher XXXPallas DOB: 09/06/1970 MRN: [id number]   Date of Service  08/20/2016  HPI/Events of Note  Agitation - Tremulous and HR = 107.   eICU Interventions  Will order: 1. Increase Ativan to 1 mg IV Q 6 hours. 2. Increase Versed to 1 mg IV Q ` hour PRN.     Intervention Category Minor Interventions: Agitation / anxiety - evaluation and management  Lenell AntuSommer,Ishia Tenorio Eugene 08/20/2016, 9:23 PM

## 2016-08-20 NOTE — Progress Notes (Signed)
CSW spoke with colleague about case and was informed to speak with Zack (Clinical Supervision) regarding placement for pt as pt may be hard to place due to behaviors. [facility].    Claude MangesKierra S. Rudi Bunyard, MSW, LCSW-A Emergency Department Clinical Social Worker [phone]

## 2016-08-20 NOTE — Progress Notes (Signed)
PULMONARY / CRITICAL CARE MEDICINE CONSULT   Name: Johnathan [name] MRN: [id number] DOB: 1970/08/08    ADMISSION DATE:  07/19/2016 CONSULTATION DATE:  07/23/2016  REFERRING MD:  Ree Kida  HISTORY OF PRESENT ILLNESS:  46 y.o. malew Hep C?, IVDA, (recently injected opana) apparently c/o fever intermittently for  1 week, and redness over the dorsum of the right foot and slight swelling. Pt notes injecting Opana about 2 days ago and cellulitis in right foot noted on admission 7/13 with labs showing EKG and abnormal LFTs with hyponatremia 123 and right lower lobe infiltrate on chest x-ray. Found to have MSSA bacteremia, possible meningitis, and LE cellulitis.  Echocardiogram Showed EF 25-63%, grade 1 diastolic dysfunction, tricuspid valve with mobile vegetation. On 7/17 He developed acute encephalopathy with agitation, becoming belligerent and combative. He denies ETOH abuse, but admitted to IVDA. He was placed on CIWA protocol, however, scores remained 19-23, patient was not responsive  to ativan, transferred to ICU for precedex gtt   SUBJECTIVE:  Tolerated extubation well.  -3.8L over past 24 hrs. Delirious this AM.   VITAL SIGNS: BP 137/87   Pulse 63   Temp 97.8 F (36.6 C) (Axillary)   Resp (!) 23   Ht _0  (1.803 m)   Wt 80.6 kg (177 lb 11.1 oz)   SpO2 98%   BMI 24.78 kg/m   HEMODYNAMICS:    VENTILATOR SETTINGS: Vent Mode: CPAP;PSV FiO2 (%):  [30 %] 30 % PEEP:  [5 cmH20] 5 cmH20 Pressure Support:  [5 cmH20] 5 cmH20  INTAKE / OUTPUT: I/O last 3 completed shifts: In: 997.1 [I.V.:857.8; NG/GT:39.3; IV Piggyback:100] Out: 8937 [Urine:6900; Stool:25; Chest Tube:550]  PHYSICAL EXAMINATION: General:  Adult male, no distress.  Integument:  Warm. Dry. No rash on exposed skin. HEENT:  Moist. His membranes. No scleral icterus. Cardiovascular:  Anasarca particularly in the lower extremities. RRR. Pulmonary:  CTAB.  Right chest tube in place with continued output. Abdomen:   Protuberant. Soft. Grossly nontender. Neurological:  Follows commands.  LABS:  BMET  Recent Labs Lab 08/19/16 0416 08/19/16 1751 08/20/16 0513  NA 145 147* 148*  K 3.4* 3.5 3.2*  CL 111 113* 113*  CO2 _1 BUN 69* 66* 58*  CREATININE 3.15* 2.92* 2.65*  GLUCOSE 115* 116* 122*    Electrolytes  Recent Labs Lab 08/18/16 1700 08/19/16 0416 08/19/16 1751 08/20/16 0513  CALCIUM 7.9* 7.8* 7.9* 7.8*  MG 1.9 1.8  --  1.7  PHOS  --  4.6 4.7* 4.7*  4.7*    CBC  Recent Labs Lab 08/18/16 0430 08/19/16 0416 08/20/16 0513  WBC 8.1 8.6 8.3  HGB 8.6* 8.5* 8.4*  HCT 26.5* 26.8* 26.6*  PLT 87* 81* 90*    Coag's  Recent Labs Lab 08/18/16 0430 08/19/16 0416 08/20/16 0513  APTT 100* 94* 103*  INR 1.30 1.38 1.34    Sepsis Markers  Recent Labs Lab 08/14/16 0430 08/15/16 0445  PROCALCITON 1.25 1.30    ABG  Recent Labs Lab 08/15/16 1720 08/18/16 0920 08/18/16 1047  PHART 7.451* 7.525* 7.437  PCO2ART 34.1 29.0* 37.1  PO2ART 108 69.6* 81.5*    Liver Enzymes  Recent Labs Lab 08/17/16 1007  08/19/16 0416 08/19/16 1751 08/20/16 0513  AST 28  --   --   --   --   ALT 17  --   --   --   --   ALKPHOS 78  --   --   --   --  BILITOT 0.7  --   --   --   --   ALBUMIN 1.3*  < > 1.3* 1.3* 1.3*  < > = values in this interval not displayed.  Cardiac Enzymes No results for input(s): TROPONINI, PROBNP in the last 168 hours.  Glucose  Recent Labs Lab 08/19/16 0802 08/19/16 1137 08/19/16 1555 08/19/16 2015 08/19/16 2320 08/20/16 0312  GLUCAP 103* 110* 105* 108* 101* 123*    Imaging Dg Chest Port 1 View  Result Date: 08/19/2016 CLINICAL DATA:  Respiratory failure with hypoxia, smoker, history substance abuse EXAM: PORTABLE CHEST 1 VIEW COMPARISON:  Portable exam 0951 hours compared 08/17/2016 FINDINGS: 2 of endotracheal tube projects 3.9 cm above carina. Nasogastric tube extends into stomach. RIGHT arm PICC line tip projects over cavoatrial  junction. RIGHT thoracostomy tube present. Upper normal size of cardiac silhouette. Scattered interstitial infiltrates with bibasilar atelectasis. Blebs at the apices. No definite pleural effusions or pneumothorax. IMPRESSION: Persistent pulmonary infiltrates with bibasilar atelectasis. Electronically Signed   By: Lavonia Dana M.D.   On: 08/19/2016 10:10    STUDIES:  RENAL U/S 7/13:   IMPRESSION: 1. Mild right pelvicaliectasis, with ureteral jet noted in the urinary bladder excluding significant ureteral obstruction. 2. Small left renal cyst. 3. Pleural effusions and abdominal ascites. LP 7/13:  Tube 1 - WBC 51 (62% lymph, 29% lymph, 9% mono), RBC 555, Protein 88 , & Glucose 52. / Tube 4 - WBC 180 (58% neutro, 31% lymph, 11% monocytes) & RBC 36. TTE 7/14:  LV normal in size with EF 55-60%. No regional wall motion abnormalities & grade 1 diastolic dysfunction. LA upper limits of normal in size & RA normal in size. RV normal in size and function. No aortic stenosis or regurgitation. Aortic root normal in size. No mitral stenosis or regurgitation. No pulmonic stenosis or regurgitation with poorly visualized valve. No tricuspid regurgitation. Medium sized mobile vegetation noted on tricuspid valve. No pericardial effusion. CT HEAD W/O 7/16:  Progressive atrophy since 2011.  No acute intracranial findings. No abnormal enhancement or vasogenic edema to suggest septic emboli to the brain. VENOUS DUPLEX BILATERAL LOWER EXTREMITY 7/17:  No DVT or SVT.  CTA CHEST 7/24:   IMPRESSION: 1. Negative for a pulmonary embolism. 2. Extensive pleural-parenchymal lung disease bilaterally. Large right pleural effusion and moderate-sized left pleural effusion. Pleural-based nodular opacities could represent an infectious etiology but also raise concern for a neoplastic process. Concern for a cavitary lesion and necrotic tissue in the right lung. Consider sampling of the pleural fluid. Recommend follow-up CT when the pleural  fluid and acute process have resolved to exclude neoplastic disease. 3. Severe emphysematous disease. 4. Cirrhosis with evidence for portal hypertension based on the splenomegaly and ascites. 5. 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 Recommend annual imaging followup by CTA or MRA. This recommendation follows 2010 ACCF/AHA/AATS/ACR/ASA/SCA/SCAI/SIR/STS/SVM Guidelines for the Diagnosis and Management of Patients with Thoracic Aortic Disease. Circulation. 2010; 121: Q119-E174 VENOUS DUPLEX LUE 7/24:  Acute DVT involving small portion of left radial vein in mid-forearm.  CT HEAD W/O 7/26:  Normal head CT. Right Pleural Effusion 7/26:  1.5 L hazy yellow fluid by IR. Protein <3.0, Albumin <1.0, Amylase 39, Glucose 72, LDH 72, & WBC 2431 (11% lymph, 82% neutro, & 7% mono). Cytology negative for malignancy.  MRI BRAIN W/O 8/2:  Exam is limited to diffusion only. Negative for acute infarct. No area of restricted diffusion. EEG 8/2:  This EEG is abnormal due to moderate  diffuse slowing of the background. Lingula BAL 8/3:  WBC 78 (39% lymph, 31% neutro, 30% mono). Reported DAH.  TTE 8/3:  LV normal in size with EF 55-60%. No regional wall motion abnormality & grade 1 diastolic dysfunction. LA & RA normal in size. RV normal in size and function. No aortic stenosis or regurgitation. Aortic root normal in size. No mitral stenosis or regurgitation. No pulmonic stenosis. Trivial regurgitation. Trivial pericardial effusion as well. Notably no vegetations were seen on this exam. Right Pleural Effusion 8/5:  WBC 616 (1% lymph, 1% eos, & 98% neutro). PORT CXR 8/5:  Previously reviewed by me. Right-sided predominant opacities. Right-sided chest tube in place. Endotracheal tube in good position. Enteric feeding tube in good position. Right upper extremity PICC line in good position. Complete Abdominal U/S 8/6: IMPRESSION: 1. Stable cirrhotic changes involving the liver without definite focal  hepatic lesion. 2. Mild common bile duct dilatation without obvious common bile duct stone or intrahepatic biliary dilatation. 3. Poor visualization of the pancreas. 4. Increased echogenicity of both kidneys suggesting medical renal disease. No hydronephrosis. 5. Large volume ascites and bilateral pleural effusions. Port CXR 8/7:  Previously reviewed by me. Hazy opacities bilateral lower lung zones. Endotracheal tube & enteric feeding tube in good position. Right chest tube in good position.  PORT CXR 8/11:  Personally reviewed by me. Patchy hazy opacities bilaterally which are improving. No appreciable residual pleural effusion on the right. Right-sided chest tube in good position. Endotracheal tube and right upper extremity central venous catheter in good position. Enteric feeding tube coursing below diaphragm. PORT ABD X-RAY 8/11: IMPRESSION: 1. Paucity of bowel gas without evidence of enteric obstruction. 2. Enteric tube tip and side port project over the expected location of the mid body of the stomach. CT abd / pelv 8/12 > persistent bilateral pleural effusion (though improved), 3cm irregular rounded density in RLL.  Cirrhosis with splenomegaly and ascites, anasarca. Inflammatory vs infectious process involving duodenum.  MICROBIOLOGY: Blood Cultures x2 7/12:  2/2 Bottles MSSA & 1/2 Bottles Pseudomonas aeruginosa  CSF Culture 7/13:  Negative  MRSA PCR 7/13:  Negative HIV 7/13:  Negative Respiratory Viral Panel PCR 7/13:  Negative  Urine Culture 7/13:  Multiple Species Present Blood Cultures x2 7/14:  1/2 Bottles MSSA Blood Culture x1 7/15:  Negative  MRSA PCR 7/17:  Negative  Urine Culture 7/17:  Negative  Urine Streptococcal Antigen 7/17:  Negative Urine Legionella Antigen 7/17:  Negative  Right Pleural Fluid Culture 7/26 >>> Bacteria Negative / AFB pending / Fungus pending Lingula BAL 8/3:  Candida tropicalis  Blood Cultures x2 8/3:  Negative  Right Pleural Culture 8/5:  Negative   Blood Culture x2 8/6:  Negative   ANTIBIOTICS: Ceftriaxone 7/13 (x1 dose) Zosyn 7/13 (x1 dose) Nafcillin 7/13 - 7/16 Cefepime 7/13 - 8/6 Vancomycin 7/13 (x1 dose); restarted 8/3 - 8/7 Tressie Ellis 8/6 >>> (plan to continue through 8/25 per ID recs)  SIGNIFICANT EVENTS: 7/17 - Transfer to ICU for precedex infusion 7/21 - Precedex off  7/25 - PCCM signed off 8/02 - Patient hypothermic & more agitated 8/03 - acute tachypnea w/ "guppy breathing" >> emergently intubated w/ bronchoscopy showing "mild diffuse alveolar hemorrhage" 8/05 - Right chest tube inserted w/ 350 mL serous fluid. Bicarb gtt started for acidosis 8/06 - Switched off Cefepime to [place] given potential for worsening encephalopathy  8/08 - Albumin w/ Lasix today. Improving UOP.  8/09 - Albumin w/ Lasix q6hr x3 doses. Precedex started in an attempt to wean  Fentanyl drip.  8/11 - N/V of bilious emesis overnight & continued diuresis 8/12 - Continued diuresis w/ Lasix & added Aldactone daily. Patient apneic during SBT. Decreasing chest tube output.  LINES/TUBES: OETT 8/3 >>>8/13 R CHEST TUBE 8/5 >>> RUE DL PICC 7/27 >>> Foley >>>  ASSESSMENT / PLAN:  PULMONARY A: Acute hypoxic respiratory failure - Multifactorial.  Extubated 8/13 and tolerated well. Cavitary Pneumonia - Likely due to tricuspid valve endocarditis. Currently on fortaz through 8/25 per ID Hemoptysis/DAH - Likely due to cavitary pneumonia. Improving. No bright red blood. 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 Transudative Right Pleural Effusion - S/P Chest tube placement. 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 P: Continuing Atrovent nebulized every 6 hours Continue chest tube to suction, will hold off on water seal for now given moderate output still See ID & Heme below  Bronchial hygiene OOB to chair and ambulate as able - will place PT consult.  Encourage ambulation Follow CXR intermittently   CARDIOVASCULAR A:  Essential Hypertension:   Monitoring with diuresis.  BP stable.  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 LUE Radial Vein DVT - Seen on duplex 7/24. No PE on CTA 7/24. P: Continuous telemetry monitoring Previously refused transesophageal echocardiogram Holding systemic and coagulation with hemoptysis Continue diuresis  RENAL A:   Recurrent Acute Renal Failure:  Improving. Good UOP with diuresis. Metabolic Acidosis:  Resolved. Hypokalemia:  Replaced. Hypomagnesemia:  Replaced.  Hyperphosphatemia. P:   Continue furosemide + aldactone  Trending urine output with Foley catheter Replacing electrolytes accordingly Follow BMP  GASTROINTESTINAL A:   New Nausea w/ Bilious Emesis: Occurred again overnight, ? Duodenitis.  Resolved AM 8/13. 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  P:   SLP eval Continuing daily Lactulose enemas until able to take PO  HEMATOLOGIC A:   Anemia:  Stable. No signs of bright red blood loss. Thrombocytopenia:  Stable. Likely due to dilution & liver cirrhosis. LUE Radial Vein DVT Coagulopathy: Stable. Likely due to cirrhosis. S/P IV Vitamin K 57m 8/6.  P:  Trending cell counts daily with CBC Continuing SCDs & heparin subcutaneous every 8 hour Transfusing for Hgb <7.0 Holding systemic anticoagulation due to hemoptysis  INFECTIOUS A:   Tricuspid Valve Endocarditis:  Not seen on subsequent TTE.  MSSA & Pseudomonas Bacteremia/Endocarditis Sepsis - Multiple etiologies.  Cavitary Pneumonia H/O Hepatitis C Viral Infection Right Lower Extremity Cellulitis - Resolved.  P:   Continuing [place] through 8/25 per ID recommendations Follow cultures  ENDOCRINE A:   Hypoglycemia - Resolved. P:   Continuing Accu-Checks q4hr w/ MD notification parameters   NEUROLOGIC A:   Acute Encephalopathy:  Improving on Precedex. Multifactorial. Question element of Delirium. No documented h/o EtOH use.   Resolved s/p extubation 8/13. IV Drug Use.  Sedation on Ventilator P:   Wean precedex to off as able Fentanyl and Ativan PRN Continuing lactulose enema daily Continuing thiamine and folic acid IV daily Lights on during day, off at night OOB to chair and up with PT    FAMILY  - Updates:  Mother updated 8/9 by Dr. NAshok Cordia   DISCUSSION:  46y.o. male with MSSA and pseudomonal bacteremia. Continuing on broad-spectrum antibiotic coverage with FTressie Ellisper ID recommendations.  Continuing diuresis with Lasix and Aldactone. Weaning off precedex and placing PT consult, SLP consult.  Currently with ICU delirium, will hopefully begin to see improvement today.    RMontey Hora PA - C  [facility] Pager: [phone]  or [phone] 08/20/2016, 7:56 AM  STAFF NOTE: I, Merrie Roof, MD FACP have personally reviewed patient's available data, including medical history, events of note, physical examination and test results as part of my evaluation. I have discussed with resident/NP and other care providers such as pharmacist, RN and RRT. In addition, I personally evaluated patient and elicited key findings of: awake, confused, chest tube output 1600 since Sunday, lung sounds unchanged coarse rt base, he was successfully extubated , remains on precedex for encephalopathy,  Renal fxn improved with this neg balance, will assess pcxr now, if nop ptx will water seal and follow output further, I am concerned that hydroptx will not stop draining well and we may be forced to dc chest tube with higher volume output, was neg 3.8, allow this neg balance with crt improved, goal is to wean precedex to off, add ativan to schedule, add prn versed, feed needs SLP, abx per ID The patient is critically ill with multiple organ systems failure and requires high complexity decision making for assessment and support, frequent evaluation and titration of therapies, application of advanced  monitoring technologies and extensive interpretation of multiple databases.   Critical Care Time devoted to patient care services described in this note is 30 Minutes. This time reflects time of care of this signee: Merrie Roof, MD FACP. This critical care time does not reflect procedure time, or teaching time or supervisory time of PA/NP/Med student/Med Resident etc but could involve care discussion time. Rest per NP/medical resident whose note is outlined above and that I agree with   [name]. Titus Mould, MD, [place] Pgr: [facility] 08/20/2016 9:22 AM

## 2016-08-20 NOTE — Progress Notes (Signed)
eLink Physician-Brief Progress Note Patient Name: [name] DOB: [birth year] MRN: [id number]   Date of Service  08/20/2016  HPI/Events of Note    eICU Interventions  Hypokalemia -repleted      Intervention Category Intermediate Interventions: Electrolyte abnormality - evaluation and management  ALVA,RAKESH V. 08/20/2016, 6:47 AM

## 2016-08-20 NOTE — Progress Notes (Signed)
CSW continues to follow SNF placement needs at this time.    Claude MangesKierra S. Hesston Hitchens, MSW, LCSW-A Emergency Department Clinical Social Worker [phone]

## 2016-08-20 NOTE — Evaluation (Signed)
Clinical/Bedside Swallow Evaluation Patient Details  Name: Johnathan [name] MRN: [id number] Date of Birth: 1970-07-11  Today's Date: 08/20/2016 Time: SLP Start Time (ACUTE ONLY): 1025 SLP Stop Time (ACUTE ONLY): 1037 SLP Time Calculation (min) (ACUTE ONLY): 12 min  Past Medical History:  Past Medical History:  Diagnosis Date  . Substance abuse    Past Surgical History:  Past Surgical History:  Procedure Laterality Date  . Incision and drainage of bilateral forearm abscesses Bilateral   . IR THORACENTESIS ASP PLEURAL SPACE W/IMG GUIDE  08/01/2016  . KNEE ARTHROSCOPY     HPI:  Ptis a 45 y.o.male admitted with sepsis secondary to UTI vs cellulitis and ARF and hyponatremia. PMH:w Hep C?, IVDA, (recently injected opana), c/o fever intermittently and redness over the dorsum of the right foot and slight swelling. Per chart pt notes injecting Opana about 2 days prior to admit. Pt seen for BSE 7/13 without indications of oropharyngeal dysphagia; shallow respiratory pattern; regular diet/thin recommended and signed off. Transferred to ICU for Precedex due to ETOH withdrawal 7/17. Developed right cavitary PNA from septic emboli with right pleural effusion. Pt was reassessed 7/28 with recommendations to remain NPO due to mentation. He became somewhat more alert and was on a pureed diet and thin liquids until he needed to be intubated 8/3-8/13. SLP was asked to reassess s/p extubation.   Assessment / Plan / Recommendation Clinical Impression  Pt has a clear vocal quality despite prolonged intubation and is able to consume larger quantities of thin liquids without overt signs of aspiration in order to complete the 3 oz water screen. He does not make any attempts to masticate soft solids, and in fact he spits them out. Recommend initiation of Dys 1 textures and thin liquids under full supervision given his mentation. SLP will continue to follow for tolerance and potential advancement pending  improved cognition. SLP Visit Diagnosis: Dysphagia, oral phase (R13.11)    Aspiration Risk  Mild aspiration risk    Diet Recommendation Dysphagia 1 (Puree);Thin liquid   Liquid Administration via: Cup;Straw Medication Administration: Whole meds with puree Supervision: Patient able to self feed;Full supervision/cueing for compensatory strategies Compensations: Slow rate;Small sips/bites;Follow solids with liquid Postural Changes: Seated upright at 90 degrees;Remain upright for at least 30 minutes after po intake    Other  Recommendations Oral Care Recommendations: Oral care BID   Follow up Recommendations  (tba)      Frequency and Duration min 2x/week  2 weeks       Prognosis Prognosis for Safe Diet Advancement: Good Barriers to Reach Goals: Cognitive deficits      Swallow Study   General Date of Onset: 07/19/16 HPI: Ptis a 45 y.o.male admitted with sepsis secondary to UTI vs cellulitis and ARF and hyponatremia. PMH:w Hep C?, IVDA, (recently injected opana), c/o fever intermittently and redness over the dorsum of the right foot and slight swelling. Per chart pt notes injecting Opana about 2 days prior to admit. Pt seen for BSE 7/13 without indications of oropharyngeal dysphagia; shallow respiratory pattern; regular diet/thin recommended and signed off. Transferred to ICU for Precedex due to ETOH withdrawal 7/17. Developed right cavitary PNA from septic emboli with right pleural effusion. Pt was reassessed 7/28 with recommendations to remain NPO due to mentation. He became somewhat more alert and was on a pureed diet and thin liquids until he needed to be intubated 8/3-8/13. SLP was asked to reassess s/p extubation. Type of Study: Bedside Swallow Evaluation Previous Swallow Assessment: see HPI Diet Prior to  this Study: NPO Temperature Spikes Noted: No Respiratory Status: Room air History of Recent Intubation: Yes Length of Intubations (days): 10 days Date extubated:  08/19/16 Behavior/Cognition: Alert;Cooperative;Confused;Requires cueing Oral Cavity Assessment: Dry;Other (comment) (some dried blood ) Oral Care Completed by SLP: Yes Oral Cavity - Dentition: Poor condition;Missing dentition (4 lower teeth, none on top) Vision: Functional for self-feeding Self-Feeding Abilities: Able to feed self Patient Positioning: Upright in bed Baseline Vocal Quality: Normal    Oral/Motor/Sensory Function Overall Oral Motor/Sensory Function: Generalized oral weakness   Ice Chips Ice chips: Not tested   Thin Liquid Thin Liquid: Within functional limits Presentation: Cup;Straw;Self Fed    Nectar Thick Nectar Thick Liquid: Not tested   Honey Thick Honey Thick Liquid: Not tested   Puree Puree: Within functional limits Presentation: Spoon   Solid   GO   Solid: Impaired Presentation: Self Fed Oral Phase Impairments: Impaired mastication;Other (comment) (no attempts to [facility] Cent[facility]asticate, spits out cracker)        Maxcine Hamaiewonsky, Trooper Olander 08/20/2016,11:04 AM  Maxcine HamLaura Paiewonsky, M.A. CCC-SLP [phone]

## 2016-08-21 ENCOUNTER — Inpatient Hospital Stay (HOSPITAL_COMMUNITY): Payer: Medicaid Other

## 2016-08-21 LAB — PHOSPHORUS: Phosphorus: 4.4 mg/dL (ref 2.5–4.6)

## 2016-08-21 LAB — URINALYSIS, ROUTINE W REFLEX MICROSCOPIC

## 2016-08-21 LAB — CBC WITH DIFFERENTIAL/PLATELET
BASOS ABS: 0 10*3/uL (ref 0.0–0.1)
Basophils Relative: 0 %
EOS ABS: 0.1 10*3/uL (ref 0.0–0.7)
Eosinophils Relative: 1 %
HCT: 26.5 % — ABNORMAL LOW (ref 39.0–52.0)
Hemoglobin: 8.3 g/dL — ABNORMAL LOW (ref 13.0–17.0)
LYMPHS PCT: 16 %
Lymphs Abs: 2 10*3/uL (ref 0.7–4.0)
MCH: 28.5 pg (ref 26.0–34.0)
MCHC: 31.3 g/dL (ref 30.0–36.0)
MCV: 91.1 fL (ref 78.0–100.0)
MONO ABS: 0.5 10*3/uL (ref 0.1–1.0)
Monocytes Relative: 4 %
NEUTROS PCT: 79 %
Neutro Abs: 10.1 10*3/uL — ABNORMAL HIGH (ref 1.7–7.7)
PLATELETS: 96 10*3/uL — AB (ref 150–400)
RBC: 2.91 MIL/uL — AB (ref 4.22–5.81)
RDW: 22.8 % — AB (ref 11.5–15.5)
WBC: 12.7 10*3/uL — AB (ref 4.0–10.5)

## 2016-08-21 LAB — BASIC METABOLIC PANEL
Anion gap: 9 (ref 5–15)
BUN: 46 mg/dL — AB (ref 6–20)
CHLORIDE: 115 mmol/L — AB (ref 101–111)
CO2: 28 mmol/L (ref 22–32)
CREATININE: 2.13 mg/dL — AB (ref 0.61–1.24)
Calcium: 7.8 mg/dL — ABNORMAL LOW (ref 8.9–10.3)
GFR calc Af Amer: 41 mL/min — ABNORMAL LOW (ref >60)
GFR calc non Af Amer: 36 mL/min — ABNORMAL LOW (ref >60)
Glucose, Bld: 97 mg/dL (ref 65–99)
Potassium: 3.3 mmol/L — ABNORMAL LOW (ref 3.5–5.1)
Sodium: 152 mmol/L — ABNORMAL HIGH (ref 135–145)

## 2016-08-21 LAB — GLUCOSE, CAPILLARY
GLUCOSE-CAPILLARY: 92 mg/dL (ref 65–99)
GLUCOSE-CAPILLARY: 89 mg/dL (ref 65–99)
GLUCOSE-CAPILLARY: 102 mg/dL — AB (ref 65–99)
Glucose-Capillary: 104 mg/dL — ABNORMAL HIGH (ref 65–99)
Glucose-Capillary: 87 mg/dL (ref 65–99)

## 2016-08-21 LAB — RENAL FUNCTION PANEL
ANION GAP: 7 (ref 5–15)
Albumin: 1.3 g/dL — ABNORMAL LOW (ref 3.5–5.0)
BUN: 37 mg/dL — ABNORMAL HIGH (ref 6–20)
CHLORIDE: 113 mmol/L — AB (ref 101–111)
CO2: 27 mmol/L (ref 22–32)
Calcium: 7.3 mg/dL — ABNORMAL LOW (ref 8.9–10.3)
Creatinine, Ser: 1.98 mg/dL — ABNORMAL HIGH (ref 0.61–1.24)
GFR calc non Af Amer: 39 mL/min — ABNORMAL LOW (ref >60)
GFR, EST AFRICAN AMERICAN: 45 mL/min — AB (ref >60)
GLUCOSE: 231 mg/dL — AB (ref 65–99)
Phosphorus: 3.9 mg/dL (ref 2.5–4.6)
Potassium: 3.2 mmol/L — ABNORMAL LOW (ref 3.5–5.1)
Sodium: 147 mmol/L — ABNORMAL HIGH (ref 135–145)

## 2016-08-21 LAB — APTT: APTT: 83 s — AB (ref 24–36)

## 2016-08-21 LAB — URINALYSIS, MICROSCOPIC (REFLEX)

## 2016-08-21 LAB — ALBUMIN: Albumin: 1.4 g/dL — ABNORMAL LOW (ref 3.5–5.0)

## 2016-08-21 LAB — PROTIME-INR
INR: 1.42
PROTHROMBIN TIME: 17.4 s — AB (ref 11.4–15.2)

## 2016-08-21 LAB — MAGNESIUM: MAGNESIUM: 1.6 mg/dL — AB (ref 1.7–2.4)

## 2016-08-21 MED ORDER — ORAL CARE MOUTH RINSE
15.0000 mL | Freq: Two times a day (BID) | OROMUCOSAL | Status: DC
  Administered 2016-08-21 – 2016-09-01 (×7): 15 mL via OROMUCOSAL

## 2016-08-21 MED ORDER — RISPERIDONE 1 MG PO TABS
1.0000 mg | ORAL_TABLET | Freq: Every day | ORAL | Status: DC
  Administered 2016-08-21 – 2016-08-22 (×2): 1 mg via ORAL
  Filled 2016-08-21 (×2): qty 1

## 2016-08-21 MED ORDER — MAGNESIUM SULFATE IN D5W 1-5 GM/100ML-% IV SOLN
1.0000 g | Freq: Once | INTRAVENOUS | Status: AC
  Administered 2016-08-21: 1 g via INTRAVENOUS
  Filled 2016-08-21 (×2): qty 100

## 2016-08-21 MED ORDER — IPRATROPIUM BROMIDE 0.02 % IN SOLN
0.5000 mg | Freq: Three times a day (TID) | RESPIRATORY_TRACT | Status: DC
  Administered 2016-08-21: 0.5 mg via RESPIRATORY_TRACT
  Filled 2016-08-21: qty 2.5

## 2016-08-21 MED ORDER — ADULT MULTIVITAMIN LIQUID CH
15.0000 mL | Freq: Every day | ORAL | Status: DC
  Administered 2016-08-21 – 2016-08-26 (×4): 15 mL via ORAL
  Filled 2016-08-21 (×8): qty 15

## 2016-08-21 MED ORDER — FUROSEMIDE 10 MG/ML IJ SOLN
20.0000 mg | Freq: Every day | INTRAMUSCULAR | Status: DC
  Administered 2016-08-22: 20 mg via INTRAVENOUS
  Filled 2016-08-21: qty 2

## 2016-08-21 MED ORDER — IPRATROPIUM BROMIDE 0.02 % IN SOLN: 0.5000 mg | Freq: Three times a day (TID) | RESPIRATORY_TRACT | Status: DC | PRN

## 2016-08-21 MED ORDER — POTASSIUM CHLORIDE 10 MEQ/100ML IV SOLN
10.0000 meq | INTRAVENOUS | Status: AC
  Administered 2016-08-21 (×3): 10 meq via INTRAVENOUS
  Filled 2016-08-21 (×3): qty 100

## 2016-08-21 MED ORDER — ENSURE ENLIVE PO LIQD
237.0000 mL | Freq: Two times a day (BID) | ORAL | Status: DC
  Administered 2016-08-21 – 2016-08-24 (×3): 237 mL via ORAL

## 2016-08-21 MED ORDER — LACTULOSE 10 GM/15ML PO SOLN
30.0000 g | Freq: Every day | ORAL | Status: DC
  Administered 2016-08-24: 30 g via ORAL
  Filled 2016-08-21 (×9): qty 45

## 2016-08-21 MED ORDER — CHLORHEXIDINE GLUCONATE 0.12 % MT SOLN
15.0000 mL | Freq: Two times a day (BID) | OROMUCOSAL | Status: DC
  Administered 2016-08-21: 15 mL via OROMUCOSAL

## 2016-08-21 MED ORDER — DEXTROSE 5 % IV SOLN
INTRAVENOUS | Status: DC
  Administered 2016-08-21 – 2016-08-22 (×3): via INTRAVENOUS
  Administered 2016-08-23: 75 mL via INTRAVENOUS
  Administered 2016-08-23: 125 mL via INTRAVENOUS
  Administered 2016-08-24: 03:00:00 via INTRAVENOUS
  Administered 2016-08-24: 1 mL via INTRAVENOUS
  Administered 2016-08-25: 06:00:00 via INTRAVENOUS

## 2016-08-21 MED ORDER — LABETALOL HCL 5 MG/ML IV SOLN
10.0000 mg | INTRAVENOUS | Status: DC | PRN
  Administered 2016-08-21 – 2016-08-27 (×7): 10 mg via INTRAVENOUS
  Filled 2016-08-21 (×11): qty 4

## 2016-08-21 NOTE — Progress Notes (Signed)
Urinary drainage bag leaking at the bottom of the bag. Unable to seal leak. Red seal broken, urinary drainage bag replaced.

## 2016-08-21 NOTE — Progress Notes (Signed)
CS[facility] regarding possible SNF placement for pt. CSW [facility]icia [facility] and was informed that she would have to check with their [facility] She also mentioned that Chantel Little [phone])  would be over to see pt today but wasn't sure of a time.  CSW contacted Chantel and was informed that at this time she is showing an e-mail from the intake team stating that they can not offer a bed to pt due to no payer at this time. Chantel also informed CSW that she would speak with the intake team to see if they will allow her to offer pt a bed through an LOG and if so she would contact CSW back to personally come see pt or to have Erich Montanehris Dudley come and see pt.   CS[facility] also [facility] [place] [facility] and no answer. CSW left VM for call to be returned. CSW will continue to assist with discharge needs.    Johnathan MangesKierra S. Alicea [name], Johnathan [name], Johnathan [name] Emergency Department Clinical Social Worker [phone]

## 2016-08-21 NOTE — Progress Notes (Signed)
Nutrition Follow-up  DOCUMENTATION CODES:   Non-severe (moderate) malnutrition in context of chronic illness  INTERVENTION:    Add MVI daily  Ensure Enlive po BID, each supplement provides 350 kcal and 20 grams of protein  NUTRITION DIAGNOSIS:   Malnutrition (Moderate) related to chronic illness (polysubstance abuse) as evidenced by moderate depletion of body fat, moderate depletions of muscle mass, moderate to severe fluid accumulation.  Ongoing  GOAL:   Patient will meet greater than or equal to 90% of their needs  Unmet  MONITOR:   TF tolerance, Vent status, I & O's  ASSESSMENT:   46 yo male with PMH of substance abuse, tobacco use, IV drug abuse, who was admitted on 7/13 with MSSA bacteremia, endocarditis, PNA, cellulitis of RLE.  Patient was extubated on 8/13. S/P swallow evaluation with SLP 8/14; diet advanced to dysphagia 1 with thin liquids. Patient sleeping, RD did not disturb patient. Breakfast tray at bedside, barely touched.  RN reports patient has not been very receptive to eating. Remains somewhat confused. Labs reviewed: sodium 152 (H), potassium 3.3 (L), magnesium 1.6 (L) Medications reviewed and include Lasix, Lactulose, folic acid, spironolactone, and thiamine. Patient with peeling skin on hands and feet. This is a symptom of niacin/vitamin B3 deficiency. Niacin deficiency can be related to liver dysfunction. Will add MVI daily.  Diet Order:  DIET - DYS 1 Room service appropriate? Yes; Fluid consistency: Thin  Skin:  Wound (see comment) (stage I buttocks, peeling skin on hands & feet)  Last BM:  8/14  Height:   Ht Readings from Last 1 Encounters:  08/18/16 5\' 11"  (1.803 m)    Weight:   Wt Readings from Last 1 Encounters:  08/21/16 165 lb 5.5 oz (75 kg)    Ideal Body Weight:  78.2 kg  BMI:  Body mass index is 23.06 kg/m.  Estimated Nutritional Needs:   Kcal:  2100-2300  Protein:  90-110 gm  Fluid:  2.1-2.3 L  EDUCATION NEEDS:    No education needs identified at this time  Joaquin CourtsKimberly Kymoni Monday, RD, LDN, CNSC Pager [phone] After Hours Pager [phone]

## 2016-08-21 NOTE — Progress Notes (Signed)
  Speech Language Pathology Treatment: Dysphagia  Patient Details Name: Johnathan [name] MRN: [id number] DOB: Jan 25, 1970 Today's Date: 08/21/2016 Time: [id number] SLP Time Calculation (min) (ACUTE ONLY): 8 min  Assessment / Plan / Recommendation Clinical Impression  Pt is more confused this morning and has less awareness and sustained attention to POs. He did take in small amounts of applesauce so RN could administer medications along with minimal amounts of thin liquids used as a wash. Pt began immediately coughing and sputtering upon initial presentation of bolus, making it difficult to differentiate would could be a potential sign of aspiration. After taking his meds, most additional boluses were spilled anteriorly from his mouth. Recommend to continue current diet for now but only offering POs when pt is alert and accepting of them, in order to still be able to attempt POs during more lucid times as his mentation seems to wax/wane. Discussed with RN - would hold POs if mentation does not allow for safe intake. Will continue to follow closely.   HPI HPI: Ptis a 46 y.o.male admitted with sepsis secondary to UTI vs cellulitis and ARF and hyponatremia. PMH:w Hep C?, IVDA, (recently injected opana), c/o fever intermittently and redness over the dorsum of the right foot and slight swelling. Per chart pt notes injecting Opana about 2 days prior to admit. Pt seen for BSE 7/13 without indications of oropharyngeal dysphagia; shallow respiratory pattern; regular diet/thin recommended and signed off. Transferred to ICU for Precedex due to ETOH withdrawal 7/17. Developed right cavitary PNA from septic emboli with right pleural effusion. Pt was reassessed 7/28 with recommendations to remain NPO due to mentation. He became somewhat more alert and was on a pureed diet and thin liquids until he needed to be intubated 8/3-8/13. SLP was asked to reassess s/p extubation.      SLP Plan  Continue with current  plan of care       Recommendations  Diet recommendations: Dysphagia 1 (puree);Thin liquid Liquids provided via: Cup;Straw Medication Administration: Crushed with puree Supervision: Staff to assist with self feeding;Full supervision/cueing for compensatory strategies Compensations: Slow rate;Small sips/bites;Follow solids with liquid Postural Changes and/or Swallow Maneuvers: Seated upright 90 degrees                Oral Care Recommendations: Oral care QID Follow up Recommendations: Skilled Nursing facility SLP Visit Diagnosis: Dysphagia, oral phase (R13.11) Plan: Continue with current plan of care       GO                Johnathan [name], Johnathan [name] 08/21/2016, 12:02 PM  Johnathan [name], M.A. CCC-SLP [phone]

## 2016-08-21 NOTE — Progress Notes (Signed)
PULMONARY / CRITICAL CARE MEDICINE CONSULT   Name: Johnathan [name] MRN: [id number] DOB: [birth year]    ADMISSION DATE:  07/19/2016 CONSULTATION DATE:  07/23/2016  REFERRING MD:  Ree Kida  HISTORY OF PRESENT ILLNESS:  46 y.o. malew Hep C?, IVDA, (recently injected opana) apparently c/o fever intermittently for  1 week, and redness over the dorsum of the right foot and slight swelling. Pt notes injecting Opana about 2 days ago and cellulitis in right foot noted on admission 7/13 with labs showing EKG and abnormal LFTs with hyponatremia 123 and right lower lobe infiltrate on chest x-ray. Found to have MSSA bacteremia, possible meningitis, and LE cellulitis.  Echocardiogram Showed EF 63-84%, grade 1 diastolic dysfunction, tricuspid valve with mobile vegetation. On 7/17 He developed acute encephalopathy with agitation, becoming belligerent and combative. He denies ETOH abuse, but admitted to IVDA. He was placed on CIWA protocol, however, scores remained 19-23, patient was not responsive  to ativan, transferred to ICU for precedex gtt   SUBJECTIVE:  No distress Chest tube to water seal No precedex   VITAL SIGNS: BP (!) 150/131   Pulse (!) 109   Temp 98.7 F (37.1 C) (Oral)   Resp (!) 31   Ht _0  (1.803 m)   Wt 75 kg (165 lb 5.5 oz)   SpO2 100%   BMI 23.06 kg/m   HEMODYNAMICS:    VENTILATOR SETTINGS:    INTAKE / OUTPUT: I/O last 3 completed shifts: In: 689.6 [I.V.:289.6; IV Piggyback:400] Out: 8070 [Urine:6505; Stool:625; Chest Tube:940]  PHYSICAL EXAMINATION: General: calm Neuro: "Build the wall", awake, calm, nonfocal HEENT: jvd wnl PULM: clearing, scattered ronchi CV: s1 s2 rrt GI: soft, bs wnl no r Extremities:  Edema legs   LABS:  BMET  Recent Labs Lab 08/20/16 0513 08/20/16 1829 08/21/16 0457  NA 148* 150* 152*  K 3.2* 3.1* 3.3*  CL 113* 114* 115*  CO2 _1 BUN 58* 54* 46*  CREATININE 2.65* 2.40* 2.13*  GLUCOSE 122* 88 97     Electrolytes  Recent Labs Lab 08/19/16 0416  08/20/16 0513 08/20/16 1829 08/21/16 0457  CALCIUM 7.8*  < > 7.8* 7.8* 7.8*  MG 1.8  --  1.7  --  1.6*  PHOS 4.6  < > 4.7*  4.7* 4.4 4.4  < > = values in this interval not displayed.  CBC  Recent Labs Lab 08/19/16 0416 08/20/16 0513 08/21/16 0457  WBC 8.6 8.3 12.7*  HGB 8.5* 8.4* 8.3*  HCT 26.8* 26.6* 26.5*  PLT 81* 90* 96*    Coag's  Recent Labs Lab 08/19/16 0416 08/20/16 0513 08/21/16 0457  APTT 94* 103* 83*  INR 1.38 1.34 1.42    Sepsis Markers  Recent Labs Lab 08/15/16 0445  PROCALCITON 1.30    ABG  Recent Labs Lab 08/15/16 1720 08/18/16 0920 08/18/16 1047  PHART 7.451* 7.525* 7.437  PCO2ART 34.1 29.0* 37.1  PO2ART 108 69.6* 81.5*    Liver Enzymes  Recent Labs Lab 08/17/16 1007  08/20/16 0513 08/20/16 1829 08/21/16 0457  AST 28  --   --   --   --   ALT 17  --   --   --   --   ALKPHOS 78  --   --   --   --   BILITOT 0.7  --   --   --   --   ALBUMIN 1.3*  < > 1.3* 1.4* 1.4*  < > = values in this interval not displayed.  Cardiac Enzymes No results for input(s): TROPONINI, PROBNP in the last 168 hours.  Glucose  Recent Labs Lab 08/20/16 1141 08/20/16 1544 08/20/16 2027 08/21/16 0017 08/21/16 0418 08/21/16 0719  GLUCAP 119* 84 85 104* 92 89    Imaging Ct Chest Wo Contrast  Result Date: 08/20/2016 CLINICAL DATA:  46 year old male with history of unresolved complicated cavitary pneumonia. Hemoptysis. Evaluate for potential empyema. EXAM: CT CHEST WITHOUT CONTRAST TECHNIQUE: Multidetector CT imaging of the chest was performed following the standard protocol without IV contrast. COMPARISON:  Chest CT 07/30/2016. FINDINGS: Cardiovascular: Heart size is normal. There is no significant pericardial fluid, thickening or pericardial calcification. There is aortic atherosclerosis, as well as atherosclerosis of the great vessels of the mediastinum and the coronary arteries, including  calcified atherosclerotic plaque in the left anterior descending coronary artery. Right upper extremity PICC with tip terminating in the superior pericardial junction. Mediastinum/Nodes: Endotracheal tube with tip terminating 4.5 cm above the carina. Nasogastric tube extending into the stomach. No pathologically enlarged mediastinal or hilar lymph nodes. Please note that accurate exclusion of hilar adenopathy is limited on noncontrast CT scans. Esophagus is unremarkable in appearance. No axillary lymphadenopathy. Lungs/Pleura: Large right and small left pleural effusions with extensive passive atelectasis in the lower lobes of the lungs bilaterally. Widespread airspace consolidation throughout the right upper and middle lobes with some scattered air-fluid levels which may reflect areas of cavitation or may simply reflect retained fluid within pre-existing bullae. The overall extent of airspace consolidation in the right upper lobe in particular has increased compared to prior chest CT 07/30/2016. No left-sided airspace consolidation is noted at this time. Previously noted pleural-based nodule in the periphery of the left upper lobe has decreased in size, currently measuring 1.4 cm (axial image 103 of series 4), presumably infectious in etiology. Severe centrilobular and paraseptal emphysema with extensive bullous changes noted throughout the lung apices. Upper Abdomen: Shrunken appearance and nodular contour of the liver, suggesting underlying cirrhosis. Spleen is incompletely visualize, but appears enlarged. Large volume of ascites in the visualized upper abdomen. Musculoskeletal: There are no aggressive appearing lytic or blastic lesions noted in the visualized portions of the skeleton. IMPRESSION: 1. Worsening cavitary pneumonia in the right upper and right middle lobes, as above. 2. Large right pleural effusion and small left pleural effusion with extensive passive atelectasis in the lower lobes of the lungs  bilaterally. Accurate assessment for empyema is limited on today's noncontrast examination. However, there is no definite gas in the right pleural space to strongly suggest empyema at this time. Additionally, the size of the right pleural effusion is very similar to the prior examination. 3. Diffuse bronchial wall thickening with severe centrilobular and paraseptal emphysema; imaging findings suggestive of underlying COPD. 4. Aortic atherosclerosis, in addition to left anterior descending coronary artery disease. Please note that although the presence of coronary artery calcium documents the presence of coronary artery disease, the severity of this disease and any potential stenosis cannot be assessed on this non-gated CT examination. Assessment for potential risk factor modification, dietary therapy or pharmacologic therapy may be warranted, if clinically indicated. 5. Morphologic changes in the liver suggestive of cirrhosis. Although incompletely visualized, there is likely associated splenomegaly. 6. Large volume of ascites noted in the visualized upper abdomen. Aortic Atherosclerosis (ICD10-I70.0) and Emphysema (ICD10-J43.9). Electronically Signed   By: Vinnie Langton M.D.   On: 08/20/2016 11:26   Dg Chest Port 1 View  Result Date: 08/21/2016 CLINICAL DATA:  Respiratory failure. EXAM: PORTABLE CHEST 1  VIEW COMPARISON:  08/20/2016, 08/19/2016, 08/11/ 2018. CT 08/10/2016, 07/30/2016. FINDINGS: Right PICC line and chest tube in stable position. Cardiomegaly with pulmonary venous congestion. Bilateral pulmonary infiltrates/edema noted. COPD. Persistent atelectasis/infiltrate right lung base. No prominent pleural effusion. No pneumothorax. IMPRESSION: 1. Lines and tubes including right chest tube in stable position. No pneumothorax. 2. Cardiomegaly with pulmonary venous congestion and bilateral pulmonary infiltrates/ edema noted on today's exam. 3. COPD. Persistent atelectasis/infiltrate right lung base.  Follow-up exams suggested demonstrate resolution. Pulmonary infarct or pulmonary mass cannot be excluded. Electronically Signed   By: Marcello Moores  Register   On: 08/21/2016 06:41   Dg Chest Port 1 View  Result Date: 08/21/2016 CLINICAL DATA:  Assess for pleural effusion.  Initial encounter. EXAM: PORTABLE CHEST 1 VIEW COMPARISON:  Chest radiograph performed earlier today at 10:44 a.m. FINDINGS: The right apical chest tube is unchanged in position. No definite pneumothorax is seen. Evaluation is somewhat suboptimal due to bulla at the lung apices. Previously noted loculated fluid on the right is perhaps slightly less apparent. Mild vascular congestion is noted. No definite left-sided pleural effusion is seen. The cardiomediastinal silhouette is normal in size. A right PICC is noted ending about the distal SVC. No acute osseous abnormalities are seen. IMPRESSION: 1. Right apical chest tube is unchanged in position. No pneumothorax seen. Evaluation is somewhat suboptimal due to bulla at the lung apices. 2. Previously noted loculated fluid on the right is perhaps slightly less apparent. Mild vascular congestion. No definite left-sided pleural effusion is now seen. Electronically Signed   By: Garald Balding M.D.   On: 08/21/2016 00:23   Dg Chest Port 1 View  Result Date: 08/20/2016 CLINICAL DATA:  Pleural effusion.  Right chest tube. EXAM: PORTABLE CHEST 1 VIEW COMPARISON:  08/19/2016 and 08/18/2011 and chest CTs dated 08/10/2016 and 07/30/2016 FINDINGS: Endotracheal tube and OG tube have been removed. PICC and right chest tube appear in good position. No pneumothorax. Probable small amount of pleural fluid loculated along the fissures at the right lung base. Improved aeration at the right lung base. Persistent haziness at the left base probably represents a small posteriorly layered pleural effusion. There is also atelectasis at the left lung base posterior medially, unchanged. There is a masslike density in the  right upper lobe medially. This could represent round atelectasis. I doubt that it represents a mass since that area was clear on chest x-ray of 07/19/2016. Heart size and pulmonary vascularity are normal. IMPRESSION: 1. Improved aeration at the right lung base since the prior study. 2. Removal of the endotracheal tube and OG tube. 3. Persistent loculated fluid on the right and probable small left effusion. 4. Persistent atelectasis at the left lung base posterior medially. 5. Chronic emphysematous changes. 6. Probable round atelectasis in the right upper lobe medially. 7. No pneumothorax. Electronically Signed   By: Lorriane Shire M.D.   On: 08/20/2016 10:57    STUDIES:  RENAL U/S 7/13:   IMPRESSION: 1. Mild right pelvicaliectasis, with ureteral jet noted in the urinary bladder excluding significant ureteral obstruction. 2. Small left renal cyst. 3. Pleural effusions and abdominal ascites. LP 7/13:  Tube 1 - WBC 51 (62% lymph, 29% lymph, 9% mono), RBC 555, Protein 88 , & Glucose 52. / Tube 4 - WBC 180 (58% neutro, 31% lymph, 11% monocytes) & RBC 36. TTE 7/14:  LV normal in size with EF 55-60%. No regional wall motion abnormalities & grade 1 diastolic dysfunction. LA upper limits of normal in size & RA  normal in size. RV normal in size and function. No aortic stenosis or regurgitation. Aortic root normal in size. No mitral stenosis or regurgitation. No pulmonic stenosis or regurgitation with poorly visualized valve. No tricuspid regurgitation. Medium sized mobile vegetation noted on tricuspid valve. No pericardial effusion. CT HEAD W/O 7/16:  Progressive atrophy since 2011.  No acute intracranial findings. No abnormal enhancement or vasogenic edema to suggest septic emboli to the brain. VENOUS DUPLEX BILATERAL LOWER EXTREMITY 7/17:  No DVT or SVT.  CTA CHEST 7/24:   IMPRESSION: 1. Negative for a pulmonary embolism. 2. Extensive pleural-parenchymal lung disease bilaterally. Large right pleural  effusion and moderate-sized left pleural effusion. Pleural-based nodular opacities could represent an infectious etiology but also raise concern for a neoplastic process. Concern for a cavitary lesion and necrotic tissue in the right lung. Consider sampling of the pleural fluid. Recommend follow-up CT when the pleural fluid and acute process have resolved to exclude neoplastic disease. 3. Severe emphysematous disease. 4. Cirrhosis with evidence for portal hypertension based on the splenomegaly and ascites. 5. 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 Recommend annual imaging followup by CTA or MRA. This recommendation follows 2010 ACCF/AHA/AATS/ACR/ASA/SCA/SCAI/SIR/STS/SVM Guidelines for the Diagnosis and Management of Patients with Thoracic Aortic Disease. Circulation. 2010; 121: L937-T024 VENOUS DUPLEX LUE 7/24:  Acute DVT involving small portion of left radial vein in mid-forearm.  CT HEAD W/O 7/26:  Normal head CT. Right Pleural Effusion 7/26:  1.5 L hazy yellow fluid by IR. Protein <3.0, Albumin <1.0, Amylase 39, Glucose 72, LDH 72, & WBC 2431 (11% lymph, 82% neutro, & 7% mono). Cytology negative for malignancy.  MRI BRAIN W/O 8/2:  Exam is limited to diffusion only. Negative for acute infarct. No area of restricted diffusion. EEG 8/2:  This EEG is abnormal due to moderate diffuse slowing of the background. Lingula BAL 8/3:  WBC 78 (39% lymph, 31% neutro, 30% mono). Reported DAH.  TTE 8/3:  LV normal in size with EF 55-60%. No regional wall motion abnormality & grade 1 diastolic dysfunction. LA & RA normal in size. RV normal in size and function. No aortic stenosis or regurgitation. Aortic root normal in size. No mitral stenosis or regurgitation. No pulmonic stenosis. Trivial regurgitation. Trivial pericardial effusion as well. Notably no vegetations were seen on this exam. Right Pleural Effusion 8/5:  WBC 616 (1% lymph, 1% eos, & 98% neutro). PORT CXR 8/5:  Previously  reviewed by me. Right-sided predominant opacities. Right-sided chest tube in place. Endotracheal tube in good position. Enteric feeding tube in good position. Right upper extremity PICC line in good position. Complete Abdominal U/S 8/6: IMPRESSION: 1. Stable cirrhotic changes involving the liver without definite focal hepatic lesion. 2. Mild common bile duct dilatation without obvious common bile duct stone or intrahepatic biliary dilatation. 3. Poor visualization of the pancreas. 4. Increased echogenicity of both kidneys suggesting medical renal disease. No hydronephrosis. 5. Large volume ascites and bilateral pleural effusions. Port CXR 8/7:  Previously reviewed by me. Hazy opacities bilateral lower lung zones. Endotracheal tube & enteric feeding tube in good position. Right chest tube in good position.  PORT CXR 8/11:  Personally reviewed by me. Patchy hazy opacities bilaterally which are improving. No appreciable residual pleural effusion on the right. Right-sided chest tube in good position. Endotracheal tube and right upper extremity central venous catheter in good position. Enteric feeding tube coursing below diaphragm. PORT ABD X-RAY 8/11: IMPRESSION: 1. Paucity of bowel gas without evidence of enteric obstruction.  2. Enteric tube tip and side port project over the expected location of the mid body of the stomach. CT abd / pelv 8/12 > persistent bilateral pleural effusion (though improved), 3cm irregular rounded density in RLL.  Cirrhosis with splenomegaly and ascites, anasarca. Inflammatory vs infectious process involving duodenum.  MICROBIOLOGY: Blood Cultures x2 7/12:  2/2 Bottles MSSA & 1/2 Bottles Pseudomonas aeruginosa  CSF Culture 7/13:  Negative  MRSA PCR 7/13:  Negative HIV 7/13:  Negative Respiratory Viral Panel PCR 7/13:  Negative  Urine Culture 7/13:  Multiple Species Present Blood Cultures x2 7/14:  1/2 Bottles MSSA Blood Culture x1 7/15:  Negative  MRSA PCR 7/17:   Negative  Urine Culture 7/17:  Negative  Urine Streptococcal Antigen 7/17:  Negative Urine Legionella Antigen 7/17:  Negative  Right Pleural Fluid Culture 7/26 >>> Bacteria Negative / AFB pending / Fungus pending Lingula BAL 8/3:  Candida tropicalis  Blood Cultures x2 8/3:  Negative  Right Pleural Culture 8/5:  Negative  Blood Culture x2 8/6:  Negative   ANTIBIOTICS: Ceftriaxone 7/13 (x1 dose) Zosyn 7/13 (x1 dose) Nafcillin 7/13 - 7/16 Cefepime 7/13 - 8/6 Vancomycin 7/13 (x1 dose); restarted 8/3 - 8/7 Tressie Ellis 8/6 >>> (plan to continue through 8/25 per ID recs)  SIGNIFICANT EVENTS: 7/17 - Transfer to ICU for precedex infusion 7/21 - Precedex off  7/25 - PCCM signed off 8/02 - Patient hypothermic & [name] agitated 8/03 - acute tachypnea w/ "guppy breathing" >> emergently intubated w/ bronchoscopy showing "mild diffuse alveolar hemorrhage" 8/05 - Right chest tube inserted w/ 350 mL serous fluid. Bicarb gtt started for acidosis 8/06 - Switched off Cefepime to [place] given potential for worsening encephalopathy  8/08 - Albumin w/ Lasix today. Improving UOP.  8/09 - Albumin w/ Lasix q6hr x3 doses. Precedex started in an attempt to wean Fentanyl drip.  8/11 - N/V of bilious emesis overnight & continued diuresis 8/12 - Continued diuresis w/ Lasix & added Aldactone daily. Patient apneic during SBT. Decreasing chest tube output.  LINES/TUBES: OETT 8/3 >>>8/13 R CHEST TUBE 8/5 >>> RUE DL PICC 7/27 >>> Foley >>>  ASSESSMENT / PLAN:  PULMONARY A: Acute hypoxic respiratory failure - Multifactorial.  Extubated 8/13 and tolerated well. Cavitary Pneumonia - Likely due to tricuspid valve endocarditis. Currently on fortaz through 8/25 per ID Hemoptysis/DAH - Likely due to cavitary pneumonia. Improving. No bright red blood. 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 Transudative Right Pleural Effusion - S/P Chest tube placement. 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 Residual ALI   P: Continuing Atrovent nebulized change to prn Continue chest tube to water seal, output to follow, goal would be to less 150 in 24 hours but with hydro PTX concern wil not meet that mark May need to use different higher output goal pcxr in am  Was neg 5.3 liters  CARDIOVASCULAR A:  Essential Hypertension:  Monitoring with diuresis.  BP stable.  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 LUE Radial Vein DVT - Seen on duplex 7/24. No PE on CTA 7/24. P: Continuous telemetry monitoring Previously refused transesophageal echocardiogram Holding systemic and coagulation with hemoptysis Continue diuresis but reduce , see renal  RENAL A:   hypernatremia Hypomagnesemia, hypo K  P:   Lasix reduce  Add d5w  bme tin pm  Mag supp  GASTROINTESTINAL A:   New Nausea w/ Bilious Emesis: Occurred again overnight, ? Duodenitis.  Resolved AM 8/13. Moderate Protein-Calorie Malnutrition Hypoalbuminemia Liver Cirrhosis w/ Ascites -  Likely due to Hepatitis C.  P:   SLP eval- diet  Lactulose keep at daily   HEMATOLOGIC A:   Anemia:  Stable. No signs of bright red blood loss. Thrombocytopenia:  Stable. Likely due to dilution & liver cirrhosis. LUE Radial Vein DVT Coagulopathy unclear, unlikely to be hep sub q  P:  Cuff off lue mixing study ua  INFECTIOUS A:   Tricuspid Valve Endocarditis:  Not seen on subsequent TTE.  MSSA & Pseudomonas Bacteremia/Endocarditis Sepsis - Multiple etiologies.  Cavitary Pneumonia H/O Hepatitis C Viral Infection Right Lower Extremity Cellulitis - Resolved.  P:   Continuing [place] through 8/25 per ID recommendations Follow cultures  picc line needed  ENDOCRINE A:   Hypoglycemia - Resolved. P:   Continuing Accu-Checks q4hr w/ MD notification parameters   NEUROLOGIC A:   Acute Encephalopathy:  Improving on Precedex. Multifactorial. Question element of Delirium. No documented h/o EtOH use.  Resolved s/p extubation 8/13. IV  Drug Use.  P:   NO PRECEDEX Fentanyl and Ativan PRN Continuing lactulose oral daily Continuing thiamine and folic acid IV daily Lights on during day, off at night OOB to chair and up with PT Add Risperdal    [name]. Titus Mould, MD, [place] Pgr: [facility] 08/21/2016 8:25 AM

## 2016-08-21 NOTE — Progress Notes (Signed)
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 Pt admitted injecting Opana about 2 days prior.  At admission 7/13 he was noted to have a cellulitis of the R foot as well as a right lower lobe infiltrate on chest x-ray. He was then found to also have MSSA bacteremia w/ a possible meningitis.  TTE showed EF 55-60%, grade 1 diastolic dysfunction, and a mobile tricuspid valve vegetation. On 7/17 He developed acute encephalopathy with agitation, becoming belligerent and combative. He was not responsive to ativan, and was transferred to the ICU for a precedex gtt.  Significant Events: 7/13 - Admit to [facility] 7/17 - Transfer to ICU for precedex infusion 7/21 - Precedex off  7/22 - TRH resumed care 7/24 - venous duplex L UE radial vein DVT 7/25 - PCCM signed off 8/02 - Patient hypothermic & more agitated 8/03 - acute tachypnea w/ "guppy breathing" > emergently intubated w/ bronchoscopy showing "mild diffuse alveolar hemorrhage" 8/05 - Right chest tube inserted w/ 350 mL serous fluid. Bicarb gtt started for acidosis 8/06 - Switched off Cefepime to [place] given potential for worsening encephalopathy  8/08 - Albumin w/ Lasix today. Improving UOP.  8/09 - Albumin w/ Lasix q6hr x3 doses. Precedex started in an attempt to wean Fentanyl drip.  8/11 - N/V of bilious emesis overnight & continued diuresis 8/12 - Continued diuresis w/ Lasix & added Aldactone daily. Patient apneic during SBT. Decreasing chest tube output. 8/13 - extubated   Subjective: Pt to be seen by PCCM today.  TRH will assume care in AM 08/21/16.  Assessment & Plan:  RLL cavitary PNA - septic emboli v/s aspiration - Transudative R pleural effusion  Respiratory status is stable - cont abx   Hemoptysis / Diffuse  alveolar hemorrhage Due to cavitary PNA -   MSSA and Pseudomonas bacteremia w/ sepsis - Tricuspid valve endocarditis  CSF culture negative - will need prolonged course of abx, and can't be sent home on IV abx due to his hx of injection drug abuse - will need SNF v/s prolonged hospital stay - endocarditis not seen on f/u TTE  LUE Radial Vein DVT - Seen on duplex 7/24. No PE on CTA 7/24  Pseudomonas bacteremia  Cont abx tx as per ID guidance  Acute encephalopathy Slowly improving over the last 24hrs - haldol as needed for agitation   Acute renal failure  crt 3.17 on admission - crt has normalized   Nausea w/ Bilious Emesis - ? Duodenitis  Hypokalemia  Illicit drug abuse   Hepatitis C - liver cirrhosis  Will need outpt f/u to determine if he is a candidate for tx AFTER he proves he is in recovery from illicit drug abuse   DVT prophylaxis: SCDs Code Status: FULL CODE Family Communication: no family present at time of exam  Disposition Plan:   Consultants:  ID PCCM  Antimicrobials:  Vanc 7/13 > 7/13 Zosyn 7/13 > 7/13 Nafcillin 7/13 > 7/16 Cefepime 7/16 >8/05 Fortaz 8/6 >  Objective: Blood pressure (!) 150/131, pulse (!) 109, temperature 98.7 F (37.1 C), temperature source Oral, resp. rate (!) 31, height 5\' 11"  (1.803 m), weight 75 kg (165 lb 5.5 oz), SpO2 100 %.  Intake/Output Summary (Last 24 hours) at 08/21/16 0813 Last  data filed at 08/21/16 0600  Gross per 24 hour  Intake            359.3 ml  Output             5370 ml  Net          -5010.7 ml   Filed Weights   08/19/16 0342 08/20/16 0451 08/21/16 0500  Weight: 85.4 kg (188 lb 4.4 oz) 80.6 kg (177 lb 11.1 oz) 75 kg (165 lb 5.5 oz)    Examination: [facility]   CBC:  Recent Labs Lab 08/17/16 0505 08/18/16 0430 08/19/16 0416 08/20/16 0513 08/21/16 0457  WBC 8.3 8.1 8.6 8.3 12.7*  NEUTROABS 5.3 5.0 5.4 5.2 PENDING  HGB 8.2* 8.6* 8.5* 8.4* 8.3*  HCT 25.2* 26.5* 26.8* 26.6* 26.5*    MCV 86.3 85.2 88.2 88.7 91.1  PLT 85* 87* 81* 90* 96*   Basic Metabolic Panel:  Recent Labs Lab 08/18/16 0430 08/18/16 1700 08/19/16 0416 08/19/16 1751 08/20/16 0513 08/20/16 1829 08/21/16 0457  NA 148* 146* 145 147* 148* 150* 152*  K 3.0* 3.2* 3.4* 3.5 3.2* 3.1* 3.3*  CL 115* 111 111 113* 113* 114* 115*  CO2 24 26 26 27 27 27 28   GLUCOSE 122* 116* 115* 116* 122* 88 97  BUN 74* 74* 69* 66* 58* 54* 46*  CREATININE 3.64* 3.45* 3.15* 2.92* 2.65* 2.40* 2.13*  CALCIUM 7.9* 7.9* 7.8* 7.9* 7.8* 7.8* 7.8*  MG 1.9 1.9 1.8  --  1.7  --  1.6*  PHOS 3.3  --  4.6 4.7* 4.7*  4.7* 4.4 4.4   GFR: Estimated Creatinine Clearance: 46.5 mL/min (A) (by C-G formula based on SCr of 2.13 mg/dL (H)).  Liver Function Tests:  Recent Labs Lab 08/17/16 1007  08/19/16 0416 08/19/16 1751 08/20/16 0513 08/20/16 1829 08/21/16 0457  AST 28  --   --   --   --   --   --   ALT 17  --   --   --   --   --   --   ALKPHOS 78  --   --   --   --   --   --   BILITOT 0.7  --   --   --   --   --   --   PROT 5.3*  --   --   --   --   --   --   ALBUMIN 1.3*  < > 1.3* 1.3* 1.3* 1.4* 1.4*  < > = values in this interval not displayed.  Recent Labs Lab 08/17/16 1007  LIPASE 125*  AMYLASE 116*   Coagulation Profile:  Recent Labs Lab 08/17/16 0505 08/18/16 0430 08/19/16 0416 08/20/16 0513 08/21/16 0457  INR 1.25 1.30 1.38 1.34 1.42   CBG:  Recent Labs Lab 08/20/16 1544 08/20/16 2027 08/21/16 0017 08/21/16 0418 08/21/16 0719  GLUCAP 84 85 104* 92 89    Recent Results (from the past 240 hour(s))  Body fluid culture     Status: None   Collection Time: 08/11/16  6:11 PM  Result Value Ref Range Status   Specimen Description PLEURAL RIGHT  Final   Special Requests Normal  Final   Gram Stain   Final    ABUNDANT WBC SEEN WBC PRESENT, PREDOMINANTLY PMN NO ORGANISMS SEEN    Culture NO GROWTH 3 DAYS  Final   Report Status 08/15/2016 FINAL  Final  Culture, blood (Routine X 2) w Reflex to  ID  Panel     Status: None   Collection Time: 08/12/16  5:48 AM  Result Value Ref Range Status   Specimen Description BLOOD LEFT FOOT  Final   Special Requests   Final    BOTTLES DRAWN AEROBIC ONLY Blood Culture adequate volume   Culture NO GROWTH 5 DAYS  Final   Report Status 08/17/2016 FINAL  Final  Culture, blood (Routine X 2) w Reflex to ID Panel     Status: None   Collection Time: 08/12/16  7:18 AM  Result Value Ref Range Status   Specimen Description BLOOD LEFT FOOT  Final   Special Requests IN PEDIATRIC BOTTLE Blood Culture adequate volume  Final   Culture NO GROWTH 5 DAYS  Final   Report Status 08/17/2016 FINAL  Final     Scheduled Meds: . Chlorhexidine Gluconate Cloth  6 each Topical Daily  . folic acid  1 mg Oral Daily  . furosemide  40 mg Intravenous Q12H  . ipratropium  0.5 mg Nebulization TID  . lactulose  30 g Oral BID  . LORazepam  1 mg Intravenous Q6H  . pantoprazole  40 mg Oral Daily  . sodium chloride flush  10-40 mL Intracatheter Q12H  . sodium chloride flush  3 mL Intravenous Q12H  . spironolactone  25 mg Oral Daily  . thiamine  100 mg Oral Daily     LOS: 33 days   Lonia Blood, MD [facility] Office  [phone] Pager - Text Page per Amion as per below:  On-Call/Text Page:      [email or web address]      password [email or web address]  If 7PM-7AM, please contact night-coverage [email or web address] Password [facility] 08/21/2016, 8:13 AM

## 2016-08-22 ENCOUNTER — Inpatient Hospital Stay (HOSPITAL_COMMUNITY): Payer: Medicaid Other

## 2016-08-22 DIAGNOSIS — E870 Hyperosmolality and hypernatremia: Secondary | ICD-10-CM

## 2016-08-22 DIAGNOSIS — E43 Unspecified severe protein-calorie malnutrition: Secondary | ICD-10-CM

## 2016-08-22 DIAGNOSIS — R042 Hemoptysis: Secondary | ICD-10-CM

## 2016-08-22 DIAGNOSIS — B182 Chronic viral hepatitis C: Secondary | ICD-10-CM

## 2016-08-22 DIAGNOSIS — F1920 Other psychoactive substance dependence, uncomplicated: Secondary | ICD-10-CM

## 2016-08-22 DIAGNOSIS — J690 Pneumonitis due to inhalation of food and vomit: Secondary | ICD-10-CM

## 2016-08-22 DIAGNOSIS — J984 Other disorders of lung: Secondary | ICD-10-CM

## 2016-08-22 LAB — RENAL FUNCTION PANEL
ALBUMIN: 1.4 g/dL — AB (ref 3.5–5.0)
ANION GAP: 9 (ref 5–15)
ANION GAP: 7 (ref 5–15)
Albumin: 1.4 g/dL — ABNORMAL LOW (ref 3.5–5.0)
BUN: 34 mg/dL — ABNORMAL HIGH (ref 6–20)
BUN: 32 mg/dL — ABNORMAL HIGH (ref 6–20)
CALCIUM: 7.7 mg/dL — AB (ref 8.9–10.3)
CO2: 29 mmol/L (ref 22–32)
CO2: 29 mmol/L (ref 22–32)
CREATININE: 2.12 mg/dL — AB (ref 0.61–1.24)
Calcium: 7.7 mg/dL — ABNORMAL LOW (ref 8.9–10.3)
Chloride: 116 mmol/L — ABNORMAL HIGH (ref 101–111)
Chloride: 113 mmol/L — ABNORMAL HIGH (ref 101–111)
Creatinine, Ser: 2 mg/dL — ABNORMAL HIGH (ref 0.61–1.24)
GFR calc Af Amer: 45 mL/min — ABNORMAL LOW (ref >60)
GFR calc non Af Amer: 39 mL/min — ABNORMAL LOW (ref >60)
GFR calc non Af Amer: 36 mL/min — ABNORMAL LOW (ref >60)
GFR, EST AFRICAN AMERICAN: 42 mL/min — AB (ref >60)
GLUCOSE: 111 mg/dL — AB (ref 65–99)
Glucose, Bld: 129 mg/dL — ABNORMAL HIGH (ref 65–99)
PHOSPHORUS: 4.2 mg/dL (ref 2.5–4.6)
POTASSIUM: 3.4 mmol/L — AB (ref 3.5–5.1)
Phosphorus: 4.4 mg/dL (ref 2.5–4.6)
Potassium: 3.3 mmol/L — ABNORMAL LOW (ref 3.5–5.1)
SODIUM: 154 mmol/L — AB (ref 135–145)
SODIUM: 149 mmol/L — AB (ref 135–145)

## 2016-08-22 LAB — URINALYSIS, ROUTINE W REFLEX MICROSCOPIC
SPECIFIC GRAVITY, URINE: 1.009 (ref 1.005–1.030)
Squamous Epithelial / LPF: NONE SEEN
pH: 6 (ref 5.0–8.0)

## 2016-08-22 LAB — BASIC METABOLIC PANEL
ANION GAP: 7 (ref 5–15)
BUN: 34 mg/dL — ABNORMAL HIGH (ref 6–20)
CHLORIDE: 116 mmol/L — AB (ref 101–111)
CO2: 30 mmol/L (ref 22–32)
Calcium: 7.7 mg/dL — ABNORMAL LOW (ref 8.9–10.3)
Creatinine, Ser: 2.04 mg/dL — ABNORMAL HIGH (ref 0.61–1.24)
GFR, EST AFRICAN AMERICAN: 44 mL/min — AB (ref >60)
GFR, EST NON AFRICAN AMERICAN: 38 mL/min — AB (ref >60)
Glucose, Bld: 111 mg/dL — ABNORMAL HIGH (ref 65–99)
POTASSIUM: 3.4 mmol/L — AB (ref 3.5–5.1)
SODIUM: 153 mmol/L — AB (ref 135–145)

## 2016-08-22 LAB — CBC WITH DIFFERENTIAL/PLATELET
BASOS ABS: 0 10*3/uL (ref 0.0–0.1)
Basophils Relative: 0 %
EOS ABS: 0.2 10*3/uL (ref 0.0–0.7)
Eosinophils Relative: 1 %
HCT: 26 % — ABNORMAL LOW (ref 39.0–52.0)
Hemoglobin: 7.9 g/dL — ABNORMAL LOW (ref 13.0–17.0)
Lymphocytes Relative: 15 %
Lymphs Abs: 2.4 10*3/uL (ref 0.7–4.0)
MCH: 27.6 pg (ref 26.0–34.0)
MCHC: 30.4 g/dL (ref 30.0–36.0)
MCV: 90.9 fL (ref 78.0–100.0)
Monocytes Absolute: 0.6 10*3/uL (ref 0.1–1.0)
Monocytes Relative: 4 %
NEUTROS ABS: 12.7 10*3/uL — AB (ref 1.7–7.7)
Neutrophils Relative %: 80 %
PLATELETS: 88 10*3/uL — AB (ref 150–400)
RBC: 2.86 MIL/uL — ABNORMAL LOW (ref 4.22–5.81)
RDW: 22.4 % — ABNORMAL HIGH (ref 11.5–15.5)
WBC: 15.9 10*3/uL — ABNORMAL HIGH (ref 4.0–10.5)

## 2016-08-22 LAB — GLUCOSE, CAPILLARY
GLUCOSE-CAPILLARY: 115 mg/dL — AB (ref 65–99)
Glucose-Capillary: 103 mg/dL — ABNORMAL HIGH (ref 65–99)
Glucose-Capillary: 103 mg/dL — ABNORMAL HIGH (ref 65–99)
Glucose-Capillary: 106 mg/dL — ABNORMAL HIGH (ref 65–99)
Glucose-Capillary: 110 mg/dL — ABNORMAL HIGH (ref 65–99)
Glucose-Capillary: 131 mg/dL — ABNORMAL HIGH (ref 65–99)

## 2016-08-22 LAB — MAGNESIUM: MAGNESIUM: 1.7 mg/dL (ref 1.7–2.4)

## 2016-08-22 LAB — APTT: aPTT: 116 seconds — ABNORMAL HIGH (ref 24–36)

## 2016-08-22 LAB — PROTIME-INR
INR: 1.56
PROTHROMBIN TIME: 18.8 s — AB (ref 11.4–15.2)

## 2016-08-22 MED ORDER — HALOPERIDOL LACTATE 5 MG/ML IJ SOLN
2.0000 mg | Freq: Four times a day (QID) | INTRAMUSCULAR | Status: AC | PRN
  Administered 2016-08-22: 2 mg via INTRAVENOUS
  Filled 2016-08-22: qty 1

## 2016-08-22 MED ORDER — HYDRALAZINE HCL 50 MG PO TABS
50.0000 mg | ORAL_TABLET | Freq: Three times a day (TID) | ORAL | Status: DC
  Administered 2016-08-22 – 2016-09-03 (×32): 50 mg via ORAL
  Filled 2016-08-22 (×37): qty 1

## 2016-08-22 MED ORDER — POTASSIUM CHLORIDE 10 MEQ/100ML IV SOLN
10.0000 meq | INTRAVENOUS | Status: AC
  Administered 2016-08-22 – 2016-08-23 (×5): 10 meq via INTRAVENOUS
  Filled 2016-08-22 (×4): qty 100

## 2016-08-22 MED ORDER — DEXTROSE 5 % IV BOLUS: 1000.0000 mL | Freq: Once | INTRAVENOUS | Status: DC

## 2016-08-22 MED ORDER — RISPERIDONE 1 MG PO TABS: 1.0000 mg | ORAL_TABLET | Freq: Two times a day (BID) | ORAL | Status: DC

## 2016-08-22 MED ORDER — GERHARDT'S BUTT CREAM
TOPICAL_CREAM | Freq: Every day | CUTANEOUS | Status: DC
  Administered 2016-08-22: 12:00:00 via TOPICAL
  Administered 2016-08-24: 1 via TOPICAL
  Administered 2016-08-25: 2 via TOPICAL
  Administered 2016-08-26: 11:00:00 via TOPICAL
  Filled 2016-08-22 (×2): qty 1

## 2016-08-22 MED ORDER — RISPERIDONE 0.5 MG PO TABS
0.5000 mg | ORAL_TABLET | Freq: Two times a day (BID) | ORAL | Status: DC
  Administered 2016-08-24 – 2016-09-03 (×21): 0.5 mg via ORAL
  Filled 2016-08-22 (×27): qty 1

## 2016-08-22 MED ORDER — RIFAXIMIN 550 MG PO TABS
550.0000 mg | ORAL_TABLET | Freq: Two times a day (BID) | ORAL | Status: DC
  Administered 2016-08-22 – 2016-09-02 (×20): 550 mg via ORAL
  Filled 2016-08-22 (×25): qty 1

## 2016-08-22 MED ORDER — MIDAZOLAM HCL 2 MG/2ML IJ SOLN
2.0000 mg | Freq: Once | INTRAMUSCULAR | Status: AC
  Administered 2016-08-22: 2 mg via INTRAVENOUS

## 2016-08-22 MED ORDER — MIDAZOLAM HCL 2 MG/2ML IJ SOLN
INTRAMUSCULAR | Status: AC
  Administered 2016-08-22: 2 mg via INTRAVENOUS
  Filled 2016-08-22: qty 2

## 2016-08-22 NOTE — Progress Notes (Signed)
SLP Cancellation Note  Patient Details Name: [name] MRN: [id number] DOB: 1970-11-20   Cancelled treatment:        Upon SLP arrival, pt was attempting to climb out out of bed and RN was administering sedating medication. SLP assisted pt back in bed but did not administer any PO trials. RN reports no overt signs of aspiration during breakfast but with little intake. SLP will continue efforts.   Maxcine Hamaiewonsky, Tsugio Elison 08/22/2016, 12:27 PM  Maxcine HamLaura Paiewonsky, M.A. CCC-SLP [phone]

## 2016-08-22 NOTE — Care Management Note (Signed)
Case Management Note  Patient Details  Name: Johnathan [name] MRN: [id number] Date of Birth: 1970-08-27  Subjective/Objective:  From home with son, pta indep, presents with cellulits of foot and R leg, on iv abx, he [name], he states he has [employer] [place] and his co pays are $3.50.  NCM called financial counselor and left message to see if patient Medicaid is active.   7/16 1742 Johnathan Capeeborah Taylor RN BSN -  Found to have MSSA bacteremia, possible meningitis, and LE cellulitis. Plan for echocardiogram and possibly TEE. ID consulted. CT of head today to r/o septic emboli.                     Action/Plan: NCM will cont to follow for dc needs.   Expected Discharge Date:                  Expected Discharge Plan:  Home/Self Care  In-House Referral:  NA  Discharge planning Services  CM Consult  Post Acute Care Choice:  NA Choice offered to:  NA  DME Arranged:  N/A DME Agency:  NA  HH Arranged:  NA HH Agency:  NA  Status of Service:  In process, will continue to follow  If discussed at Long Length of Stay Meetings, dates discussed:    Additional Comments: 08/22/2016  Discussed in LOS 8/16 - pt remains appropriate for continued stay.  CSW following for potential discharge to SNF with the need of IV antibotics  Discussed in LOS 8/7 - pt remains appropriate for continued stay.  Pt is on ventilator, sedation and tube feeds.  CSW following for possible placement due to long term IV antibitoics  07/23/16 Pt transferred to ICU for Precedex drip due to ETOH.   Pt is a current IVDA.  If pt requires long term IV antibiotics - pt will require SNF due to IVDA.  CSW tentatively consulted Johnathan [name], Johnathan Wombles S, RN 08/22/2016, 2:41 PM

## 2016-08-22 NOTE — Progress Notes (Signed)
PULMONARY / CRITICAL CARE MEDICINE CONSULT   Name: Johnathan [name] MRN: [id number] DOB: [birth year]    ADMISSION DATE:  07/19/2016 CONSULTATION DATE:  07/23/2016  REFERRING MD:  Ree Kida  HISTORY OF PRESENT ILLNESS:  46 y.o. malew Hep C?, IVDA, (recently injected opana) apparently c/o fever intermittently for  1 week, and redness over the dorsum of the right foot and slight swelling. Pt notes injecting Opana about 2 days ago and cellulitis in right foot noted on admission 7/13 with labs showing EKG and abnormal LFTs with hyponatremia 123 and right lower lobe infiltrate on chest x-ray. Found to have MSSA bacteremia, possible meningitis, and LE cellulitis.  Echocardiogram Showed EF 54-49%, grade 1 diastolic dysfunction, tricuspid valve with mobile vegetation. On 7/17 He developed acute encephalopathy with agitation, becoming belligerent and combative. He denies ETOH abuse, but admitted to IVDA. He was placed on CIWA protocol, however, scores remained 19-23, patient was not responsive  to ativan, transferred to ICU for precedex gtt   SUBJECTIVE:  Ativan dc, improved neuro Ativan dc, did well with versed Neg 1.8 liters   VITAL SIGNS: BP (!) 174/92   Pulse 80   Temp 99 F (37.2 C) (Oral)   Resp (!) 36   Ht _0  (1.803 m)   Wt 72.6 kg (160 lb 0.9 oz)   SpO2 98%   BMI 22.32 kg/m   HEMODYNAMICS:    VENTILATOR SETTINGS:    INTAKE / OUTPUT: I/O last 3 completed shifts: In: 1850 [I.V.:1000; IV Piggyback:850] Out: 6020 [Urine:4525; Stool:525; Chest Tube:970]  PHYSICAL EXAMINATION: General: more awake Neuro: "i dont care", moving all ext HEENT: jvd wnl PULM: improved coarse bs rt, base CV: s1 s2 RRR no  GI: soft, bs wnl, no r Extremities: no edema    LABS:  BMET  Recent Labs Lab 08/21/16 0457 08/21/16 1912 08/22/16 0405  NA 152* 147* 153*  154*  K 3.3* 3.2* 3.4*  3.4*  CL 115* 113* 116*  116*  CO2 _1 BUN 46* 37* 34*  34*  CREATININE 2.13*  1.98* 2.04*  2.00*  GLUCOSE 97 231* 111*  111*    Electrolytes  Recent Labs Lab 08/20/16 0513  08/21/16 0457 08/21/16 1912 08/22/16 0405  CALCIUM 7.8*  < > 7.8* 7.3* 7.7*  7.7*  MG 1.7  --  1.6*  --  1.7  PHOS 4.7*  4.7*  < > 4.4 3.9 4.4  < > = values in this interval not displayed.  CBC  Recent Labs Lab 08/20/16 0513 08/21/16 0457 08/22/16 0405  WBC 8.3 12.7* 15.9*  HGB 8.4* 8.3* 7.9*  HCT 26.6* 26.5* 26.0*  PLT 90* 96* 88*    Coag's  Recent Labs Lab 08/20/16 0513 08/21/16 0457 08/22/16 0405  APTT 103* 83* 116*  INR 1.34 1.42 1.56    Sepsis Markers No results for input(s): LATICACIDVEN, PROCALCITON, O2SATVEN in the last 168 hours.  ABG  Recent Labs Lab 08/15/16 1720 08/18/16 0920 08/18/16 1047  PHART 7.451* 7.525* 7.437  PCO2ART 34.1 29.0* 37.1  PO2ART 108 69.6* 81.5*    Liver Enzymes  Recent Labs Lab 08/17/16 1007  08/21/16 0457 08/21/16 1912 08/22/16 0405  AST 28  --   --   --   --   ALT 17  --   --   --   --   ALKPHOS 78  --   --   --   --   BILITOT 0.7  --   --   --   --  ALBUMIN 1.3*  < > 1.4* 1.3* 1.4*  < > = values in this interval not displayed.  Cardiac Enzymes No results for input(s): TROPONINI, PROBNP in the last 168 hours.  Glucose  Recent Labs Lab 08/21/16 0719 08/21/16 1104 08/21/16 2000 08/22/16 0050 08/22/16 0329 08/22/16 0750  GLUCAP 89 87 102* 103* 103* 106*    Imaging No results found.  STUDIES:  RENAL U/S 7/13:   IMPRESSION: 1. Mild right pelvicaliectasis, with ureteral jet noted in the urinary bladder excluding significant ureteral obstruction. 2. Small left renal cyst. 3. Pleural effusions and abdominal ascites. LP 7/13:  Tube 1 - WBC 51 (62% lymph, 29% lymph, 9% mono), RBC 555, Protein 88 , & Glucose 52. / Tube 4 - WBC 180 (58% neutro, 31% lymph, 11% monocytes) & RBC 36. TTE 7/14:  LV normal in size with EF 55-60%. No regional wall motion abnormalities & grade 1 diastolic dysfunction. LA  upper limits of normal in size & RA normal in size. RV normal in size and function. No aortic stenosis or regurgitation. Aortic root normal in size. No mitral stenosis or regurgitation. No pulmonic stenosis or regurgitation with poorly visualized valve. No tricuspid regurgitation. Medium sized mobile vegetation noted on tricuspid valve. No pericardial effusion. CT HEAD W/O 7/16:  Progressive atrophy since 2011.  No acute intracranial findings. No abnormal enhancement or vasogenic edema to suggest septic emboli to the brain. VENOUS DUPLEX BILATERAL LOWER EXTREMITY 7/17:  No DVT or SVT.  CTA CHEST 7/24:   IMPRESSION: 1. Negative for a pulmonary embolism. 2. Extensive pleural-parenchymal lung disease bilaterally. Large right pleural effusion and moderate-sized left pleural effusion. Pleural-based nodular opacities could represent an infectious etiology but also raise concern for a neoplastic process. Concern for a cavitary lesion and necrotic tissue in the right lung. Consider sampling of the pleural fluid. Recommend follow-up CT when the pleural fluid and acute process have resolved to exclude neoplastic disease. 3. Severe emphysematous disease. 4. Cirrhosis with evidence for portal hypertension based on the splenomegaly and ascites. 5. 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 Recommend annual imaging followup by CTA or MRA. This recommendation follows 2010 ACCF/AHA/AATS/ACR/ASA/SCA/SCAI/SIR/STS/SVM Guidelines for the Diagnosis and Management of Patients with Thoracic Aortic Disease. Circulation. 2010; 121: U765-Y650 VENOUS DUPLEX LUE 7/24:  Acute DVT involving small portion of left radial vein in mid-forearm.  CT HEAD W/O 7/26:  Normal head CT. Right Pleural Effusion 7/26:  1.5 L hazy yellow fluid by IR. Protein <3.0, Albumin <1.0, Amylase 39, Glucose 72, LDH 72, & WBC 2431 (11% lymph, 82% neutro, & 7% mono). Cytology negative for malignancy.  MRI BRAIN W/O 8/2:  Exam is limited  to diffusion only. Negative for acute infarct. No area of restricted diffusion. EEG 8/2:  This EEG is abnormal due to moderate diffuse slowing of the background. Lingula BAL 8/3:  WBC 78 (39% lymph, 31% neutro, 30% mono). Reported DAH.  TTE 8/3:  LV normal in size with EF 55-60%. No regional wall motion abnormality & grade 1 diastolic dysfunction. LA & RA normal in size. RV normal in size and function. No aortic stenosis or regurgitation. Aortic root normal in size. No mitral stenosis or regurgitation. No pulmonic stenosis. Trivial regurgitation. Trivial pericardial effusion as well. Notably no vegetations were seen on this exam. Right Pleural Effusion 8/5:  WBC 616 (1% lymph, 1% eos, & 98% neutro). PORT CXR 8/5:  Previously reviewed by me. Right-sided predominant opacities. Right-sided chest tube in place. Endotracheal tube in  good position. Enteric feeding tube in good position. Right upper extremity PICC line in good position. Complete Abdominal U/S 8/6: IMPRESSION: 1. Stable cirrhotic changes involving the liver without definite focal hepatic lesion. 2. Mild common bile duct dilatation without obvious common bile duct stone or intrahepatic biliary dilatation. 3. Poor visualization of the pancreas. 4. Increased echogenicity of both kidneys suggesting medical renal disease. No hydronephrosis. 5. Large volume ascites and bilateral pleural effusions. Port CXR 8/7:  Previously reviewed by me. Hazy opacities bilateral lower lung zones. Endotracheal tube & enteric feeding tube in good position. Right chest tube in good position.  PORT CXR 8/11:  Personally reviewed by me. Patchy hazy opacities bilaterally which are improving. No appreciable residual pleural effusion on the right. Right-sided chest tube in good position. Endotracheal tube and right upper extremity central venous catheter in good position. Enteric feeding tube coursing below diaphragm. PORT ABD X-RAY 8/11: IMPRESSION: 1. Paucity of bowel  gas without evidence of enteric obstruction. 2. Enteric tube tip and side port project over the expected location of the mid body of the stomach. CT abd / pelv 8/12 > persistent bilateral pleural effusion (though improved), 3cm irregular rounded density in RLL.  Cirrhosis with splenomegaly and ascites, anasarca. Inflammatory vs infectious process involving duodenum.  MICROBIOLOGY: Blood Cultures x2 7/12:  2/2 Bottles MSSA & 1/2 Bottles Pseudomonas aeruginosa  CSF Culture 7/13:  Negative  MRSA PCR 7/13:  Negative HIV 7/13:  Negative Respiratory Viral Panel PCR 7/13:  Negative  Urine Culture 7/13:  Multiple Species Present Blood Cultures x2 7/14:  1/2 Bottles MSSA Blood Culture x1 7/15:  Negative  MRSA PCR 7/17:  Negative  Urine Culture 7/17:  Negative  Urine Streptococcal Antigen 7/17:  Negative Urine Legionella Antigen 7/17:  Negative  Right Pleural Fluid Culture 7/26 >>> Bacteria Negative / AFB pending / Fungus pending Lingula BAL 8/3:  Candida tropicalis  Blood Cultures x2 8/3:  Negative  Right Pleural Culture 8/5:  Negative  Blood Culture x2 8/6:  Negative   ANTIBIOTICS: Ceftriaxone 7/13 (x1 dose) Zosyn 7/13 (x1 dose) Nafcillin 7/13 - 7/16 Cefepime 7/13 - 8/6 Vancomycin 7/13 (x1 dose); restarted 8/3 - 8/7 Tressie Ellis 8/6 >>> (plan to continue through 8/25 per ID recs)  SIGNIFICANT EVENTS: 7/17 - Transfer to ICU for precedex infusion 7/21 - Precedex off  7/25 - PCCM signed off 8/02 - Patient hypothermic & more agitated 8/03 - acute tachypnea w/ "guppy breathing" >> emergently intubated w/ bronchoscopy showing "mild diffuse alveolar hemorrhage" 8/05 - Right chest tube inserted w/ 350 mL serous fluid. Bicarb gtt started for acidosis 8/06 - Switched off Cefepime to [place] given potential for worsening encephalopathy  8/08 - Albumin w/ Lasix today. Improving UOP.  8/09 - Albumin w/ Lasix q6hr x3 doses. Precedex started in an attempt to wean Fentanyl drip.  8/11 - N/V of bilious  emesis overnight & continued diuresis 8/12 - Continued diuresis w/ Lasix & added Aldactone daily. Patient apneic during SBT. Decreasing chest tube output.  LINES/TUBES: OETT 8/3 >>>8/13 R CHEST TUBE 8/5 >>> RUE DL PICC 7/27 >>> Foley >>>  ASSESSMENT / PLAN:  PULMONARY A: Acute hypoxic respiratory failure - Multifactorial.  Extubated 8/13 and tolerated well. Cavitary Pneumonia - Likely due to tricuspid valve endocarditis. Currently on fortaz through 8/25 per ID Hemoptysis/DAH - Likely due to cavitary pneumonia. Improving. No bright red blood. 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 Transudative Right Pleural Effusion - S/P Chest tube placement. 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 Residual ALI  P: Continuing Atrovent nebulized change to prn Favor neg balance Chest tube output 400 since 7 am, may need to accept lower goals to remove this tube Obtain pcxr this am , ensure no ptx on water seal Favor keeping tube with diuresis aggressive then follow output- unable with na now Na affecting neuro  RENAL A:   Hypernatremia (avoid) Hypomagnesemia, hypo K  P:   Lasix dc d5w increase to 125 bmet q12h   HEMATOLOGIC A:   Anemia:  Stable. No signs of bright red blood loss. Thrombocytopenia:  Stable. Likely due to dilution & liver cirrhosis. LUE Radial Vein DVT Coagulopathy unclear, unlikely to be hep sub q  P:  Cuff off lue mixing study- pending ua WBC noted, dc foley unable, consider change, may have been trauma to explain rbc, repeat ua  INFECTIOUS A:   Tricuspid Valve Endocarditis:  Not seen on subsequent TTE.  MSSA & Pseudomonas Bacteremia/Endocarditis Sepsis - Multiple etiologies.  Cavitary Pneumonia H/O Hepatitis C Viral Infection Right Lower Extremity Cellulitis - Resolved.  P:   Continuing Fortaz through 8/25 per ID recommendations  NEUROLOGIC A:   Acute Encephalopathy:  Improving on Precedex. Multifactorial. Question element of Delirium. No  documented h/o EtOH use.  Resolved s/p extubation 8/13. IV Drug Use.  P:   NO PRECEDEX Fentanyl and Ativan PRN - dc as paradoxical reaction  - listed in allergy Continuing lactulose oral daily, add rixam Continuing thiamine and folic acid IV daily Lights on during day, off at night OOB to chair and up with PT ordsred  Risperdal to bid, may need to go to 2 mg   [name]. Titus Mould, MD, [place] Pgr: [facility] 08/22/2016 9:55 AM

## 2016-08-22 NOTE — Progress Notes (Signed)
PROGRESS NOTE    Johnathan [name]  HFW:[id number] DOB: 02-28-1970 DOA: 07/19/2016 PCP: [name], MD   Brief Narrative:  46yo Male PMHx Hep C and IVDA, Polysubstance Abuse   Presented w/ fever for 1 week w/ redness and swelling over the dorsum of the right foot.  Pt admitted injecting Opana about 2 days prior.  At admission 7/13 he was noted to have a cellulitis of the R foot as well as a right lower lobe infiltrate on chest x-ray. He was then found to also have MSSA bacteremia w/ a possible meningitis.  TTE showed EF 02-77%, grade 1 diastolic dysfunction, and a mobile tricuspid valve vegetation. On 7/17 He developed acute encephalopathy with agitation, becoming belligerent and combative. He was not responsive to ativan, and was transferred to the ICU for a precedex gtt.      Subjective: 8/16 A/O 0 (patient does not know name, where, why, when), will follow some commands. Does state negative CP, negative SOB    Assessment & Plan:   Principal Problem:   MSSA bacteremia Active Problems:   Sepsis ([place])   UTI (urinary tract infection)   ARF (acute renal failure) (HCC)   Hyponatremia   Abnormal liver function   AKI (acute kidney injury) ([place])   IVDU (intravenous drug user)   Meningitis   Pneumonia   Bacteremia due to Pseudomonas   Endocarditis of tricuspid valve   Bacteremia   Acute encephalopathy   Alcohol withdrawal syndrome with complication (HCC)   Opiate withdrawal (HCC)   Recurrent right pleural effusion   Acute respiratory failure (HCC)   Anoxic brain injury ([place])   Pressure injury of skin   RLL cavitary PNA /- septic emboli v/s aspiration -Continue current antibiotic regimen: Will require long-term IV antibiotics.  -Respiratory status stable   Hemoptysis / Diffuse alveolar hemorrhage -Due to cavitary PNA -    MSSA and Pseudomonas bacteremia w/ sepsis/Tricuspid valve Endocarditis  -CSF culture negative -Will require 6-8 weeks  antibiotics. -Patient will require LTAC or SNF secondary to IV drug abuse patient cannot go home with long-term access -ID guiding antibiotic care.   Hepatitis C - liver cirrhosis  -Patient currently not candidate for treatment secondary to drug abuse -Patient can follow-up with ID: ID to determine if/when patient would be candidate for treatment   LUE Radial Vein DVT  - Seen on duplex 7/24. No PE on CTA 7/24  Acute Encephalopathy -Patient continues to be totally confused, agitated to the point of pulling out lines and monitor wires. -Requires restraints and a sitter to reorient.  -Decrease sedating medication (DC Fentanyl, decreased Risperdal)   Acute Renal Failure  -Cr  3.17 on admission: Secondary to dehydration.  Lab Results  Component Value Date   CREATININE 2.00 (H) 08/22/2016   CREATININE 2.04 (H) 08/22/2016   CREATININE 1.98 (H) 08/21/2016  -Improved but has not normalized.     Nausea w/ Bilious Emesis - ? Duodenitis -Resolved    Hypokalemia -Potassium IV 50 mEq    Hypernatremia -D5W 125 ml/hr -Repeat BMP 1400  Drug abuse and dependence  -Resolved    Protein calorie malnutrition -8/16 requested placement of CorTrak tube -8/16 Nutrition consult    DVT prophylaxis: SCD Code Status: Full Family Communication: None Disposition Plan: Requires LTAC or SNF   Consultants:  Operating Room Services M  ID    Procedures/Significant Events:  7/13 - Admit to [facility] 7/17 - Transfer to ICU for precedex infusion 7/21 - Precedex off  7/22 - TRH resumed care  7/24 - venous duplex L UE radial vein DVT 7/25 - PCCM signed off 8/02 - Patient hypothermic & more agitated 8/03 - acute tachypnea w/ "guppy breathing" > emergently intubated w/ bronchoscopy showing "mild diffuse alveolar hemorrhage" 8/05 - Right chest tube inserted w/ 350 mL serous fluid. Bicarb gtt started for acidosis 8/06 - Switched off Cefepime to [place] given potential for worsening encephalopathy  8/08 - Albumin w/ Lasix  today. Improving UOP.  8/09 - Albumin w/ Lasix q6hr x3 doses. Precedex started in an attempt to wean Fentanyl drip.  8/11 - N/V of bilious emesis overnight & continued diuresis 8/12 - Continued diuresis w/ Lasix & added Aldactone daily. Patient apneic during SBT. Decreasing chest tube output. 8/13 - extubated       RENAL U/S 7/13:   IMPRESSION: 1. Mild right pelvicaliectasis, with ureteral jet noted in the urinary bladder excluding significant ureteral obstruction. 2. Small left renal cyst. 3. Pleural effusions and abdominal ascites. LP 7/13:  Tube 1 - WBC 51 (62% lymph, 29% lymph, 9% mono), RBC 555, Protein 88 , & Glucose 52. / Tube 4 - WBC 180 (58% neutro, 31% lymph, 11% monocytes) & RBC 36. TTE 7/14:  LV normal in size with EF 55-60%. No regional wall motion abnormalities & grade 1 diastolic dysfunction. LA upper limits of normal in size & RA normal in size. RV normal in size and function. No aortic stenosis or regurgitation. Aortic root normal in size. No mitral stenosis or regurgitation. No pulmonic stenosis or regurgitation with poorly visualized valve. No tricuspid regurgitation. Medium sized mobile vegetation noted on tricuspid valve. No pericardial effusion. CT HEAD W/O 7/16:  Progressive atrophy since 2011.  No acute intracranial findings. No abnormal enhancement or vasogenic edema to suggest septic emboli to the brain. VENOUS DUPLEX BILATERAL LOWER EXTREMITY 7/17:  No DVT or SVT.  CTA CHEST 7/24:  Negative for a pulmonary embolism. Extensive pleural-parenchymal lung disease bilaterally. Large right pleural effusion and moderate-sized left pleural effusion. Pleural-based nodular opacities could represent an infectious etiology but also raise concern for a neoplastic process. Concern for a cavitary lesion and necrotic tissue in the right lung. Consider sampling of the pleural fluid. Recommend follow-up CT when the pleural fluid and acute process have resolved to exclude neoplastic  disease. Severe emphysematous disease. Cirrhosis with evidence for portal hypertension based on the splenomegaly and ascites. 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  VENOUS DUPLEX LUE 7/24:  Acute DVT involving small portion of left radial vein in mid-forearm.  CT HEAD W/O 7/26:  Normal head CT. Right Pleural Effusion 7/26:  1.5 L hazy yellow fluid by IR. Protein <3.0, Albumin <1.0, Amylase 39, Glucose 72, LDH 72, & WBC 2431 (11% lymph, 82% neutro, & 7% mono). Cytology negative for malignancy.  MRI BRAIN W/O 8/2:  Exam is limited to diffusion only. Negative for acute infarct. No area of restricted diffusion. EEG 8/2:  This EEG is abnormal due to moderate diffuse slowing of the background. Lingula BAL 8/3:  WBC 78 (39% lymph, 31% neutro, 30% mono). Reported DAH.  TTE 8/3:  LV normal in size with EF 55-60%. No regional wall motion abnormality & grade 1 diastolic dysfunction. LA & RA normal in size. RV normal in size and function. No aortic stenosis or regurgitation. Aortic root normal in size. No mitral stenosis or regurgitation. No pulmonic stenosis. Trivial regurgitation. Trivial pericardial effusion as well. Notably no vegetations were seen on this exam. Right Pleural Effusion 8/5:  WBC 616 (1% lymph, 1% eos, & 98% neutro). PORT CXR 8/5:  Previously reviewed by me. Right-sided predominant opacities. Right-sided chest tube in place. Endotracheal tube in good position. Enteric feeding tube in good position. Right upper extremity PICC line in good position. Complete Abdominal U/S 8/6: Stable cirrhotic changes involving the liver without definite focal hepatic lesion. Mild common bile duct dilatation without obvious common bile duct stone or intrahepatic biliary dilatation. Poor visualization of the pancreas. Increased echogenicity of both kidneys suggesting medical renal disease. No hydronephrosis. Large volume ascites and bilateral pleural effusions. Port CXR 8/7:  Previously  reviewed by me. Hazy opacities bilateral lower lung zones. Endotracheal tube & enteric feeding tube in good position. Right chest tube in good position.  PORT CXR 8/11:  Personally reviewed by me. Patchy hazy opacities bilaterally which are improving. No appreciable residual pleural effusion on the right. Right-sided chest tube in good position. Endotracheal tube and right upper extremity central venous catheter in good position. Enteric feeding tube coursing below diaphragm. PORT ABD X-RAY 8/11: Paucity of bowel gas without evidence of enteric obstruction. Enteric tube tip and side port project over the expected location of the mid body of the stomach. CT abd / pelv 8/12 > persistent bilateral pleural effusion (though improved), 3cm irregular rounded density in RLL.  Cirrhosis with splenomegaly and ascites, anasarca. Inflammatory vs infectious process involving duodenum.                      I have personally reviewed and interpreted all radiology studies and my findings are as above.  VENTILATOR SETTINGS: None   Cultures Blood Cultures x2 7/12:  2/2 Bottles MSSA & 1/2 Bottles Pseudomonas aeruginosa  CSF Culture 7/13:  Negative  MRSA PCR 7/13:  Negative HIV 7/13:  Negative Respiratory Viral Panel PCR 7/13:  Negative  Urine Culture 7/13:  Multiple Species Present Blood Cultures x2 7/14:  1/2 Bottles MSSA Blood Culture x1 7/15:  Negative  MRSA PCR 7/17:  Negative  Urine Culture 7/17:  Negative  Urine Streptococcal Antigen 7/17:  Negative Urine Legionella Antigen 7/17:  Negative  Right Pleural Fluid Culture 7/26 >>> Bacteria Negative / AFB pending / Fungus pending Lingula BAL 8/3:  Candida tropicalis  Blood Cultures x2 8/3:  Negative  Right Pleural Culture 8/5:  Negative  Blood Culture x2 8/6:  Negative    Antimicrobials: Anti-infectives    Start     Stop   08/22/16 1100  rifaximin (XIFAXAN) tablet 550 mg         08/19/16 1000  cefTAZidime (FORTAZ) 1 g in dextrose  5 % 50 mL IVPB     08/31/16 2359   08/12/16 1100  cefTAZidime (FORTAZ) 2 g in dextrose 5 % 50 mL IVPB  Status:  Discontinued     08/19/16 0555   08/11/16 1000  ceFEPIme (MAXIPIME) 1 g in dextrose 5 % 50 mL IVPB  Status:  Discontinued     08/12/16 0936   08/10/16 1500  vancomycin (VANCOCIN) 1,250 mg in sodium chloride 0.9 % 250 mL IVPB  Status:  Discontinued     08/11/16 0930   08/10/16 1000  ceFEPIme (MAXIPIME) 2 g in dextrose 5 % 50 mL IVPB  Status:  Discontinued     08/11/16 0930   08/09/16 1300  vancomycin (VANCOCIN) 1,500 mg in sodium chloride 0.9 % 500 mL IVPB     08/09/16 1602   07/22/16 1400  ceFEPIme (MAXIPIME) 2 g in dextrose 5 % 50  mL IVPB  Status:  Discontinued     08/09/16 1226   07/20/16 1500  ceFEPIme (MAXIPIME) 2 g in dextrose 5 % 50 mL IVPB  Status:  Discontinued     07/22/16 1122   07/19/16 2300  vancomycin (VANCOCIN) IVPB 750 mg/150 ml premix  Status:  Discontinued     07/19/16 0953   07/19/16 2200  ceFEPIme (MAXIPIME) 2 g in dextrose 5 % 50 mL IVPB  Status:  Discontinued     07/20/16 1410   07/19/16 2000  nafcillin 2 g in dextrose 5 % 100 mL IVPB  Status:  Discontinued     07/22/16 1130   07/19/16 1915  nafcillin injection 2 g  Status:  Discontinued     07/19/16 1915   07/19/16 1800  ceFEPIme (MAXIPIME) 2 g in dextrose 5 % 50 mL IVPB  Status:  Discontinued     07/19/16 1912   07/19/16 1100  vancomycin (VANCOCIN) 500 mg in sodium chloride 0.9 % 100 mL IVPB  Status:  Discontinued     07/19/16 1645   07/19/16 0800  piperacillin-tazobactam (ZOSYN) IVPB 3.375 g  Status:  Discontinued     07/19/16 1645   07/19/16 0200  cefTRIAXone (ROCEPHIN) 2 g in dextrose 5 % 50 mL IVPB     07/19/16 0259   07/19/16 0130  cefTRIAXone (ROCEPHIN) 1 g in dextrose 5 % 50 mL IVPB  Status:  Discontinued     07/19/16 0140   07/19/16 0115  piperacillin-tazobactam (ZOSYN) IVPB 3.375 g     07/19/16 0219   07/19/16 0115  vancomycin (VANCOCIN) IVPB 1000 mg/200 mL premix     07/19/16 0243        Devices    LINES / TUBES:  OETT 8/3 >>>8/13 Rt CHEST TUBE 8/5 >>> RUE DL PICC 7/27 >>> Foley >>>    Continuous Infusions: . cefTAZidime (FORTAZ)  IV Stopped (08/21/16 2130)  . dextrose 50 mL/hr at 08/22/16 0600     Objective: Vitals:   08/22/16 0351 08/22/16 0400 08/22/16 0500 08/22/16 0600  BP:  (!) 165/82 (!) 179/83 (!) 174/92  Pulse:  94 79 80  Resp:  19 16 (!) 36  Temp:  98.3 F (36.8 C)    TempSrc:  Axillary    SpO2:  92% 92% 98%  Weight: 160 lb 0.9 oz (72.6 kg)     Height:        Intake/Output Summary (Last 24 hours) at 08/22/16 0750 Last data filed at 08/22/16 0600  Gross per 24 hour  Intake             1500 ml  Output             3030 ml  Net            -1530 ml   Filed Weights   08/20/16 0451 08/21/16 0500 08/22/16 0351  Weight: 177 lb 11.1 oz (80.6 kg) 165 lb 5.5 oz (75 kg) 160 lb 0.9 oz (72.6 kg)    Examination:  General: A/O 0 (does not know full name, where, when, why) No acute respiratory distress, cachectic Eyes: negative scleral hemorrhage, negative anisocoria, negative icterus ENT: Negative Runny nose, negative gingival bleeding, Neck:  Negative scars, masses, torticollis, lymphadenopathy, JVD Lungs: Clear to auscultation on the left with mild crackles on right, negative wheezing. Right chest tube in place covered and clean  Cardiovascular: Regular rate and rhythm without murmur gallop or rub normal S1 and S2 Abdomen: Positive abdominal pain palpation, nondistended,  positive soft, bowel sounds, no rebound, no ascites, no appreciable mass Extremities: No significant cyanosis, clubbing, or edema bilateral lower extremities Skin: Negative rashes, lesions, ulcers Psychiatric:  Unable to evaluate secondary to altered mental status  Central nervous system:  Cranial nerves II through XII intact, tongue/uvula midline, moved all extremities spontaneously and at times to command unable to fully evaluate secondary to altered metal status   .      Data Reviewed: Care during the described time interval was provided by me .  I have reviewed this patient's available data, including medical history, events of note, physical examination, and all test results as part of my evaluation.   CBC:  Recent Labs Lab 08/18/16 0430 08/19/16 0416 08/20/16 0513 08/21/16 0457 08/22/16 0405  WBC 8.1 8.6 8.3 12.7* 15.9*  NEUTROABS 5.0 5.4 5.2 10.1* 12.7*  HGB 8.6* 8.5* 8.4* 8.3* 7.9*  HCT 26.5* 26.8* 26.6* 26.5* 26.0*  MCV 85.2 88.2 88.7 91.1 90.9  PLT 87* 81* 90* 96* 88*   Basic Metabolic Panel:  Recent Labs Lab 08/18/16 1700 08/19/16 0416  08/20/16 0513 08/20/16 1829 08/21/16 0457 08/21/16 1912 08/22/16 0405  NA 146* 145  < > 148* 150* 152* 147* 153*  154*  K 3.2* 3.4*  < > 3.2* 3.1* 3.3* 3.2* 3.4*  3.4*  CL 111 111  < > 113* 114* 115* 113* 116*  116*  CO2 26 26  < > _0 GLUCOSE 116* 115*  < > 122* 88 97 231* 111*  111*  BUN 74* 69*  < > 58* 54* 46* 37* 34*  34*  CREATININE 3.45* 3.15*  < > 2.65* 2.40* 2.13* 1.98* 2.04*  2.00*  CALCIUM 7.9* 7.8*  < > 7.8* 7.8* 7.8* 7.3* 7.7*  7.7*  MG 1.9 1.8  --  1.7  --  1.6*  --  1.7  PHOS  --  4.6  < > 4.7*  4.7* 4.4 4.4 3.9 4.4  < > = values in this interval not displayed. GFR: Estimated Creatinine Clearance: 47.9 mL/min (A) (by C-G formula based on SCr of 2 mg/dL (H)). Liver Function Tests:  Recent Labs Lab 08/17/16 1007  08/20/16 0513 08/20/16 1829 08/21/16 0457 08/21/16 1912 08/22/16 0405  AST 28  --   --   --   --   --   --   ALT 17  --   --   --   --   --   --   ALKPHOS 78  --   --   --   --   --   --   BILITOT 0.7  --   --   --   --   --   --   PROT 5.3*  --   --   --   --   --   --   ALBUMIN 1.3*  < > 1.3* 1.4* 1.4* 1.3* 1.4*  < > = values in this interval not displayed.  Recent Labs Lab 08/17/16 1007  LIPASE 125*  AMYLASE 116*   No results for input(s): AMMONIA in the last 168 hours. Coagulation Profile:  Recent Labs Lab  08/18/16 0430 08/19/16 0416 08/20/16 0513 08/21/16 0457 08/22/16 0405  INR 1.30 1.38 1.34 1.42 1.56   Cardiac Enzymes: No results for input(s): CKTOTAL, CKMB, CKMBINDEX, TROPONINI in the last 168 hours. BNP (last 3 results) No results for input(s): PROBNP in the last 8760 hours. HbA1C: No results for input(s): HGBA1C in the  last 72 hours. CBG:  Recent Labs Lab 08/21/16 0719 08/21/16 1104 08/21/16 2000 08/22/16 0050 08/22/16 0329  GLUCAP 89 87 102* 103* 103*   Lipid Profile: No results for input(s): CHOL, HDL, LDLCALC, TRIG, CHOLHDL, LDLDIRECT in the last 72 hours. Thyroid Function Tests: No results for input(s): TSH, T4TOTAL, FREET4, T3FREE, THYROIDAB in the last 72 hours. Anemia Panel: No results for input(s): VITAMINB12, FOLATE, FERRITIN, TIBC, IRON, RETICCTPCT in the last 72 hours. Urine analysis:    Component Value Date/Time   COLORURINE RED (A) 08/21/2016 1057   APPEARANCEUR TURBID (A) 08/21/2016 1057   LABSPEC  08/21/2016 1057    TEST NOT REPORTED DUE TO COLOR INTERFERENCE OF URINE PIGMENT   PHURINE  08/21/2016 1057    TEST NOT REPORTED DUE TO COLOR INTERFERENCE OF URINE PIGMENT   GLUCOSEU (A) 08/21/2016 1057    TEST NOT REPORTED DUE TO COLOR INTERFERENCE OF URINE PIGMENT   HGBUR (A) 08/21/2016 1057    TEST NOT REPORTED DUE TO COLOR INTERFERENCE OF URINE PIGMENT   BILIRUBINUR (A) 08/21/2016 1057    TEST NOT REPORTED DUE TO COLOR INTERFERENCE OF URINE PIGMENT   KETONESUR (A) 08/21/2016 1057    TEST NOT REPORTED DUE TO COLOR INTERFERENCE OF URINE PIGMENT   PROTEINUR (A) 08/21/2016 1057    TEST NOT REPORTED DUE TO COLOR INTERFERENCE OF URINE PIGMENT   NITRITE (A) 08/21/2016 1057    TEST NOT REPORTED DUE TO COLOR INTERFERENCE OF URINE PIGMENT   LEUKOCYTESUR (A) 08/21/2016 1057    TEST NOT REPORTED DUE TO COLOR INTERFERENCE OF URINE PIGMENT   Sepsis Labs: _0 (procalcitonin:4,lacticidven:4)  )No results found for this or any previous visit (from the  past 240 hour(s)).       Radiology Studies: Ct Chest Wo Contrast  Result Date: 08/20/2016 CLINICAL DATA:  46 year old male with history of unresolved complicated cavitary pneumonia. Hemoptysis. Evaluate for potential empyema. EXAM: CT CHEST WITHOUT CONTRAST TECHNIQUE: Multidetector CT imaging of the chest was performed following the standard protocol without IV contrast. COMPARISON:  Chest CT 07/30/2016. FINDINGS: Cardiovascular: Heart size is normal. There is no significant pericardial fluid, thickening or pericardial calcification. There is aortic atherosclerosis, as well as atherosclerosis of the great vessels of the mediastinum and the coronary arteries, including calcified atherosclerotic plaque in the left anterior descending coronary artery. Right upper extremity PICC with tip terminating in the superior pericardial junction. Mediastinum/Nodes: Endotracheal tube with tip terminating 4.5 cm above the carina. Nasogastric tube extending into the stomach. No pathologically enlarged mediastinal or hilar lymph nodes. Please note that accurate exclusion of hilar adenopathy is limited on noncontrast CT scans. Esophagus is unremarkable in appearance. No axillary lymphadenopathy. Lungs/Pleura: Large right and small left pleural effusions with extensive passive atelectasis in the lower lobes of the lungs bilaterally. Widespread airspace consolidation throughout the right upper and middle lobes with some scattered air-fluid levels which may reflect areas of cavitation or may simply reflect retained fluid within pre-existing bullae. The overall extent of airspace consolidation in the right upper lobe in particular has increased compared to prior chest CT 07/30/2016. No left-sided airspace consolidation is noted at this time. Previously noted pleural-based nodule in the periphery of the left upper lobe has decreased in size, currently measuring 1.4 cm (axial image 103 of series 4), presumably infectious in  etiology. Severe centrilobular and paraseptal emphysema with extensive bullous changes noted throughout the lung apices. Upper Abdomen: Shrunken appearance and nodular contour of the liver, suggesting underlying cirrhosis. Spleen is incompletely visualize, but appears  enlarged. Large volume of ascites in the visualized upper abdomen. Musculoskeletal: There are no aggressive appearing lytic or blastic lesions noted in the visualized portions of the skeleton. IMPRESSION: 1. Worsening cavitary pneumonia in the right upper and right middle lobes, as above. 2. Large right pleural effusion and small left pleural effusion with extensive passive atelectasis in the lower lobes of the lungs bilaterally. Accurate assessment for empyema is limited on today's noncontrast examination. However, there is no definite gas in the right pleural space to strongly suggest empyema at this time. Additionally, the size of the right pleural effusion is very similar to the prior examination. 3. Diffuse bronchial wall thickening with severe centrilobular and paraseptal emphysema; imaging findings suggestive of underlying COPD. 4. Aortic atherosclerosis, in addition to left anterior descending coronary artery disease. Please note that although the presence of coronary artery calcium documents the presence of coronary artery disease, the severity of this disease and any potential stenosis cannot be assessed on this non-gated CT examination. Assessment for potential risk factor modification, dietary therapy or pharmacologic therapy may be warranted, if clinically indicated. 5. Morphologic changes in the liver suggestive of cirrhosis. Although incompletely visualized, there is likely associated splenomegaly. 6. Large volume of ascites noted in the visualized upper abdomen. Aortic Atherosclerosis (ICD10-I70.0) and Emphysema (ICD10-J43.9). Electronically Signed   By: Vinnie Langton M.D.   On: 08/20/2016 11:26   Dg Chest Port 1 View  Result  Date: 08/21/2016 CLINICAL DATA:  Respiratory failure. EXAM: PORTABLE CHEST 1 VIEW COMPARISON:  08/20/2016, 08/19/2016, 08/11/ 2018. CT 08/10/2016, 07/30/2016. FINDINGS: Right PICC line and chest tube in stable position. Cardiomegaly with pulmonary venous congestion. Bilateral pulmonary infiltrates/edema noted. COPD. Persistent atelectasis/infiltrate right lung base. No prominent pleural effusion. No pneumothorax. IMPRESSION: 1. Lines and tubes including right chest tube in stable position. No pneumothorax. 2. Cardiomegaly with pulmonary venous congestion and bilateral pulmonary infiltrates/ edema noted on today's exam. 3. COPD. Persistent atelectasis/infiltrate right lung base. Follow-up exams suggested demonstrate resolution. Pulmonary infarct or pulmonary mass cannot be excluded. Electronically Signed   By: Marcello Moores  Register   On: 08/21/2016 06:41   Dg Chest Port 1 View  Result Date: 08/21/2016 CLINICAL DATA:  Assess for pleural effusion.  Initial encounter. EXAM: PORTABLE CHEST 1 VIEW COMPARISON:  Chest radiograph performed earlier today at 10:44 a.m. FINDINGS: The right apical chest tube is unchanged in position. No definite pneumothorax is seen. Evaluation is somewhat suboptimal due to bulla at the lung apices. Previously noted loculated fluid on the right is perhaps slightly less apparent. Mild vascular congestion is noted. No definite left-sided pleural effusion is seen. The cardiomediastinal silhouette is normal in size. A right PICC is noted ending about the distal SVC. No acute osseous abnormalities are seen. IMPRESSION: 1. Right apical chest tube is unchanged in position. No pneumothorax seen. Evaluation is somewhat suboptimal due to bulla at the lung apices. 2. Previously noted loculated fluid on the right is perhaps slightly less apparent. Mild vascular congestion. No definite left-sided pleural effusion is now seen. Electronically Signed   By: Garald Balding M.D.   On: 08/21/2016 00:23   Dg Chest  Port 1 View  Result Date: 08/20/2016 CLINICAL DATA:  Pleural effusion.  Right chest tube. EXAM: PORTABLE CHEST 1 VIEW COMPARISON:  08/19/2016 and 08/18/2011 and chest CTs dated 08/10/2016 and 07/30/2016 FINDINGS: Endotracheal tube and OG tube have been removed. PICC and right chest tube appear in good position. No pneumothorax. Probable small amount of pleural fluid loculated along the fissures at  the right lung base. Improved aeration at the right lung base. Persistent haziness at the left base probably represents a small posteriorly layered pleural effusion. There is also atelectasis at the left lung base posterior medially, unchanged. There is a masslike density in the right upper lobe medially. This could represent round atelectasis. I doubt that it represents a mass since that area was clear on chest x-ray of 07/19/2016. Heart size and pulmonary vascularity are normal. IMPRESSION: 1. Improved aeration at the right lung base since the prior study. 2. Removal of the endotracheal tube and OG tube. 3. Persistent loculated fluid on the right and probable small left effusion. 4. Persistent atelectasis at the left lung base posterior medially. 5. Chronic emphysematous changes. 6. Probable round atelectasis in the right upper lobe medially. 7. No pneumothorax. Electronically Signed   By: Lorriane Shire M.D.   On: 08/20/2016 10:57        Scheduled Meds: . chlorhexidine  15 mL Mouth Rinse BID  . Chlorhexidine Gluconate Cloth  6 each Topical Daily  . feeding supplement (ENSURE ENLIVE)  237 mL Oral BID BM  . folic acid  1 mg Oral Daily  . furosemide  20 mg Intravenous Daily  . lactulose  30 g Oral Daily  . mouth rinse  15 mL Mouth Rinse q12n4p  . multivitamin  15 mL Oral Daily  . pantoprazole  40 mg Oral Daily  . risperiDONE  1 mg Oral Daily  . sodium chloride flush  10-40 mL Intracatheter Q12H  . sodium chloride flush  3 mL Intravenous Q12H  . spironolactone  25 mg Oral Daily  . thiamine  100 mg  Oral Daily   Continuous Infusions: . cefTAZidime (FORTAZ)  IV Stopped (08/21/16 2130)  . dextrose 50 mL/hr at 08/22/16 0600     LOS: 34 days    Time spent: 40 minutes    Jannae Fagerstrom, Geraldo Docker, MD [facility] Pager [phone]   If 7PM-7AM, please contact night-coverage [email or web address] Password [email or web address] 08/22/2016, 7:50 AM

## 2016-08-22 NOTE — Progress Notes (Signed)
Pharmacy Antibiotic Note  Johnathan [name] is a 46 year old male with hx Hep C, IVDA who presented on 7/13 with R-foot redness/swelling with fevers. The patient is noted to have MSSA + Psuedomonas bacteremia with TTE positive for tricuspid valve IE. ID recommends treatment through the 25th..   WBC wnl and afebrile. His renal function continues to improve and may require a dose adjustment should it continue to improve.  Plan: Continue ceftazidime 1gm IV Q12H with end date 8/25 Monitor clinical status, renal function  Height: _0  (180.3 cm) Weight: 160 lb 0.9 oz (72.6 kg) IBW/kg (Calculated) : 75.3  Temp (24hrs), Avg:98.8 F (37.1 C), Min:98.3 F (36.8 C), Max:99.1 F (37.3 C)   Recent Labs Lab 08/18/16 0430  08/19/16 0416  08/20/16 0513 08/20/16 1829 08/21/16 0457 08/21/16 1912 08/22/16 0405  WBC 8.1  --  8.6  --  8.3  --  12.7*  --  15.9*  CREATININE 3.64*  < > 3.15*  < > 2.65* 2.40* 2.13* 1.98* 2.04*  2.00*  < > = values in this interval not displayed.    Antibiotics on this admission: Zosyn 7/13 >> 7/13 Vanc 7/13 >> 7/13; 8/3 >>8/7 Cefepime 7/13 >>8/6 Nafcillin 7/13 >> 7/16 Ceftazidime 8/6>> [8/25]  Dose Adjustments made 08/13 Ceftazidime 2gm Q24h changed to 1gm Q12h due to improved renal function  Cultures: 7/12 BCx - MSSA + PSA (pan sensitive) 7/13 CSF cx - negative 7/13 RVP- negative 7/13 MRSA PCR - negative 7/13 UCx - negative 7/14 BCx - 1/2 MSSA 7/15 BCx - neg 7/17 Urine- neg 7/26 pleural fluid- gram stain neg, cx ngF 7/26 AFB- neg 7/26 Fungal:neg 8/3 blood cx: pending 8/3 BAL: NGTD 8/3 Blood - Bishop PharmD PGY1 Pharmacy Practice Resident 08/22/2016 5:43 AM Pager: [phone]

## 2016-08-22 NOTE — Progress Notes (Addendum)
As of today, Dr. Mervyn SkeetersA suggested that CSW hold off on SNF placement at this time due to fluctuation in pt's medical needs and medications that pt is on. CSW at this time still has not heard anything from [facility] and rehab.  CSW will continue to assists with placement needs once medically stable for discharge.     Claude MangesKierra S. Kinzly Pierrelouis, MSW, LCSW-A Emergency Department Clinical Social Worker [phone]

## 2016-08-23 ENCOUNTER — Inpatient Hospital Stay (HOSPITAL_COMMUNITY): Payer: Medicaid Other

## 2016-08-23 LAB — CBC WITH DIFFERENTIAL/PLATELET
BASOS PCT: 0 %
Basophils Absolute: 0 10*3/uL (ref 0.0–0.1)
EOS ABS: 0.3 10*3/uL (ref 0.0–0.7)
EOS PCT: 3 %
HCT: 24.8 % — ABNORMAL LOW (ref 39.0–52.0)
HEMOGLOBIN: 7.6 g/dL — AB (ref 13.0–17.0)
Lymphocytes Relative: 24 %
Lymphs Abs: 2.3 10*3/uL (ref 0.7–4.0)
MCH: 27.9 pg (ref 26.0–34.0)
MCHC: 30.6 g/dL (ref 30.0–36.0)
MCV: 91.2 fL (ref 78.0–100.0)
MONO ABS: 0.3 10*3/uL (ref 0.1–1.0)
Monocytes Relative: 3 %
NEUTROS ABS: 6.8 10*3/uL (ref 1.7–7.7)
NEUTROS PCT: 70 %
Platelets: 85 10*3/uL — ABNORMAL LOW (ref 150–400)
RBC: 2.72 MIL/uL — ABNORMAL LOW (ref 4.22–5.81)
RDW: 21.5 % — ABNORMAL HIGH (ref 11.5–15.5)
WBC: 9.7 10*3/uL (ref 4.0–10.5)

## 2016-08-23 LAB — RENAL FUNCTION PANEL
Albumin: 1.4 g/dL — ABNORMAL LOW (ref 3.5–5.0)
Albumin: 1.4 g/dL — ABNORMAL LOW (ref 3.5–5.0)
Anion gap: 6 (ref 5–15)
Anion gap: 6 (ref 5–15)
BUN: 31 mg/dL — AB (ref 6–20)
BUN: 25 mg/dL — ABNORMAL HIGH (ref 6–20)
CALCIUM: 7.4 mg/dL — AB (ref 8.9–10.3)
CHLORIDE: 111 mmol/L (ref 101–111)
CHLORIDE: 108 mmol/L (ref 101–111)
CO2: 28 mmol/L (ref 22–32)
CO2: 28 mmol/L (ref 22–32)
CREATININE: 1.99 mg/dL — AB (ref 0.61–1.24)
Calcium: 7.5 mg/dL — ABNORMAL LOW (ref 8.9–10.3)
Creatinine, Ser: 2.05 mg/dL — ABNORMAL HIGH (ref 0.61–1.24)
GFR calc Af Amer: 43 mL/min — ABNORMAL LOW (ref >60)
GFR, EST AFRICAN AMERICAN: 45 mL/min — AB (ref >60)
GFR, EST NON AFRICAN AMERICAN: 37 mL/min — AB (ref >60)
GFR, EST NON AFRICAN AMERICAN: 39 mL/min — AB (ref >60)
Glucose, Bld: 126 mg/dL — ABNORMAL HIGH (ref 65–99)
Glucose, Bld: 114 mg/dL — ABNORMAL HIGH (ref 65–99)
POTASSIUM: 3.3 mmol/L — AB (ref 3.5–5.1)
Phosphorus: 3.7 mg/dL (ref 2.5–4.6)
Phosphorus: 3.3 mg/dL (ref 2.5–4.6)
Potassium: 3.1 mmol/L — ABNORMAL LOW (ref 3.5–5.1)
SODIUM: 142 mmol/L (ref 135–145)
Sodium: 145 mmol/L (ref 135–145)

## 2016-08-23 LAB — PTT FACTOR INHIBITOR (MIXING STUDY)
APTT 11 NP INCUB. MIX CTL: 30.4 s — AB (ref 22.9–30.2)
aPTT 1:1 NP Mix, 60 Min,Incub.: 31 s — ABNORMAL HIGH (ref 22.9–30.2)
aPTT 1:1 Normal Plasma: 29.4 s (ref 22.9–30.2)
aPTT: 35.2 s — ABNORMAL HIGH (ref 22.9–30.2)

## 2016-08-23 LAB — PROTIME-INR
INR: 1.43
Prothrombin Time: 17.5 seconds — ABNORMAL HIGH (ref 11.4–15.2)

## 2016-08-23 LAB — APTT: aPTT: 92 seconds — ABNORMAL HIGH (ref 24–36)

## 2016-08-23 LAB — MAGNESIUM: MAGNESIUM: 1.4 mg/dL — AB (ref 1.7–2.4)

## 2016-08-23 MED ORDER — MAGNESIUM SULFATE 2 GM/50ML IV SOLN
2.0000 g | Freq: Once | INTRAVENOUS | Status: AC
  Administered 2016-08-23: 2 g via INTRAVENOUS
  Filled 2016-08-23: qty 50

## 2016-08-23 MED ORDER — POTASSIUM CHLORIDE 10 MEQ/100ML IV SOLN
INTRAVENOUS | Status: AC
  Administered 2016-08-23: 10 meq via INTRAVENOUS
  Filled 2016-08-23: qty 100

## 2016-08-23 MED ORDER — HALOPERIDOL LACTATE 5 MG/ML IJ SOLN
2.0000 mg | Freq: Four times a day (QID) | INTRAMUSCULAR | Status: DC | PRN
  Administered 2016-08-23: 2 mg via INTRAVENOUS
  Filled 2016-08-23: qty 1

## 2016-08-23 MED ORDER — HALOPERIDOL LACTATE 5 MG/ML IJ SOLN: 2.0000 mg | INTRAMUSCULAR | Status: DC | PRN

## 2016-08-23 NOTE — Progress Notes (Signed)
Tried multiple times to get Mr. Buffkin to take medications and he continues to refuse. MD notified and made aware.  Rashidah Belleville, Kae Heller, RN

## 2016-08-23 NOTE — Progress Notes (Signed)
Nutrition Consult / Follow-up  DOCUMENTATION CODES:   Non-severe (moderate) malnutrition in context of chronic illness  INTERVENTION:    Continue to offer Ensure Enlive BID and liquid MVI daily.  If able to place feeding tube, recommend: Jevity 1.2 at 75 ml/h to provide 2160 kcal, 100 gm protein, 1458 ml free water daily.  NUTRITION DIAGNOSIS:   Malnutrition (Moderate) related to chronic illness (polysubstance abuse) as evidenced by moderate depletion of body fat, moderate depletions of muscle mass, moderate to severe fluid accumulation.  Ongoing  GOAL:   Patient will meet greater than or equal to 90% of their needs  Unmet  MONITOR:   TF tolerance, Vent status, I & O's  REASON FOR ASSESSMENT:   Consult Enteral/tube feeding initiation and management  ASSESSMENT:   46 yo male with PMH of substance abuse, tobacco use, IV drug abuse, who was admitted on 7/13 with MSSA bacteremia, endocarditis, PNA, cellulitis of RLE.  Received MD Consult for TF initiation and management. Plans to place Cortrak tube for nutrition, however, patient has been combative and refused food from SLP today. Doubt Cortrak team will be able to place tube at this time. Patient is receiving a PO diet, but he is refusing to eat anything or drink Ensure supplements. Labs and medications reviewed.  Diet Order:  DIET - DYS 1 Room service appropriate? Yes; Fluid consistency: Thin  Skin:   (peeling skin on hands & feet)  Last BM:  8/17  Height:   Ht Readings from Last 1 Encounters:  08/18/16 5\' 11"  (1.803 m)    Weight:   Wt Readings from Last 1 Encounters:  08/22/16 160 lb 11.5 oz (72.9 kg)    Ideal Body Weight:  78.2 kg  BMI:  Body mass index is 22.42 kg/m.  Estimated Nutritional Needs:   Kcal:  2100-2300  Protein:  90-110 gm  Fluid:  2.1-2.3 L  EDUCATION NEEDS:   No education needs identified at this time  Joaquin Courts, RD, LDN, CNSC Pager [phone] After Hours Pager  [phone]

## 2016-08-23 NOTE — Progress Notes (Signed)
PULMONARY / CRITICAL CARE MEDICINE CONSULT   Name: Johnathan [name] MRN: [id number] DOB: 07/13/1970    ADMISSION DATE:  07/19/2016 CONSULTATION DATE:  07/23/2016  REFERRING MD:  Ree Kida  HISTORY OF PRESENT ILLNESS:  46 y.o. malew Hep C?, IVDA, (recently injected opana) apparently c/o fever intermittently for  1 week, and redness over the dorsum of the right foot and slight swelling. Pt notes injecting Opana about 2 days ago and cellulitis in right foot noted on admission 7/13 with labs showing EKG and abnormal LFTs with hyponatremia 123 and right lower lobe infiltrate on chest x-ray. Found to have MSSA bacteremia, possible meningitis, and LE cellulitis.  Echocardiogram Showed EF 57-01%, grade 1 diastolic dysfunction, tricuspid valve with mobile vegetation. On 7/17 He developed acute encephalopathy with agitation, becoming belligerent and combative. He denies ETOH abuse, but admitted to IVDA. He was placed on CIWA protocol, however, scores remained 19-23, patient was not responsive  to ativan, transferred to ICU for precedex gtt   SUBJECTIVE:  Calm, comfortable R chest tube in place, drained 600cc yellow fluid last 24h  VITAL SIGNS: BP (!) 140/94 (BP Location: Left Leg)   Pulse 68   Temp 98.5 F (36.9 C) (Oral)   Resp 20   Ht _0  (1.803 m)   Wt 72.9 kg (160 lb 11.5 oz)   SpO2 98%   BMI 22.42 kg/m   HEMODYNAMICS:    VENTILATOR SETTINGS:    INTAKE / OUTPUT: I/O last 3 completed shifts: In: 3586.3 [P.O.:180; I.V.:3246.3; Other:10; IV Piggyback:150] Out: 2080 [Urine:950; Chest Tube:1130]  PHYSICAL EXAMINATION: General: more awake Neuro: "i dont care", moving all ext HEENT: jvd wnl PULM: improved coarse bs rt, base CV: s1 s2 RRR no  GI: soft, bs wnl, no r Extremities: no edema    LABS:  BMET  Recent Labs Lab 08/22/16 0405 08/22/16 1610 08/23/16 0431  NA 153*  154* 149* 145  K 3.4*  3.4* 3.3* 3.3*  CL 116*  116* 113* 111  CO2 _1 BUN 34*   34* 32* 31*  CREATININE 2.04*  2.00* 2.12* 2.05*  GLUCOSE 111*  111* 129* 126*    Electrolytes  Recent Labs Lab 08/21/16 0457  08/22/16 0405 08/22/16 1610 08/23/16 0431  CALCIUM 7.8*  < > 7.7*  7.7* 7.7* 7.5*  MG 1.6*  --  1.7  --  1.4*  PHOS 4.4  < > 4.4 4.2 3.7  < > = values in this interval not displayed.  CBC  Recent Labs Lab 08/21/16 0457 08/22/16 0405 08/23/16 0431  WBC 12.7* 15.9* 9.7  HGB 8.3* 7.9* 7.6*  HCT 26.5* 26.0* 24.8*  PLT 96* 88* 85*    Coag's  Recent Labs Lab 08/21/16 0457 08/22/16 0405 08/23/16 0431  APTT 83* 116* 92*  INR 1.42 1.56 1.43    Sepsis Markers No results for input(s): LATICACIDVEN, PROCALCITON, O2SATVEN in the last 168 hours.  ABG  Recent Labs Lab 08/18/16 0920 08/18/16 1047  PHART 7.525* 7.437  PCO2ART 29.0* 37.1  PO2ART 69.6* 81.5*    Liver Enzymes  Recent Labs Lab 08/17/16 1007  08/22/16 0405 08/22/16 1610 08/23/16 0431  AST 28  --   --   --   --   ALT 17  --   --   --   --   ALKPHOS 78  --   --   --   --   BILITOT 0.7  --   --   --   --  ALBUMIN 1.3*  < > 1.4* 1.4* 1.4*  < > = values in this interval not displayed.  Cardiac Enzymes No results for input(s): TROPONINI, PROBNP in the last 168 hours.  Glucose  Recent Labs Lab 08/22/16 0050 08/22/16 0329 08/22/16 0750 08/22/16 1148 08/22/16 1612 08/22/16 2017  GLUCAP 103* 103* 106* 115* 110* 131*    Imaging Dg Chest Port 1 View  Result Date: 08/23/2016 CLINICAL DATA:  08/22/2016. EXAM: PORTABLE CHEST 1 VIEW COMPARISON:  Chest x-ray 16 2018 . FINDINGS: Right PICC line right chest tube in stable position. Heart size normal. Mild subsegmental atelectasis and or infiltrate right mid lung. No pleural effusion or pneumothorax. IMPRESSION: Right PICC line right chest tube in stable position. No pneumothorax . Mild subsegmental atelectasis right mid lung. Electronically Signed   By: Marcello Moores  Register   On: 08/23/2016 09:52    STUDIES:  RENAL U/S  7/13:   IMPRESSION: 1. Mild right pelvicaliectasis, with ureteral jet noted in the urinary bladder excluding significant ureteral obstruction. 2. Small left renal cyst. 3. Pleural effusions and abdominal ascites. LP 7/13:  Tube 1 - WBC 51 (62% lymph, 29% lymph, 9% mono), RBC 555, Protein 88 , & Glucose 52. / Tube 4 - WBC 180 (58% neutro, 31% lymph, 11% monocytes) & RBC 36. TTE 7/14:  LV normal in size with EF 55-60%. No regional wall motion abnormalities & grade 1 diastolic dysfunction. LA upper limits of normal in size & RA normal in size. RV normal in size and function. No aortic stenosis or regurgitation. Aortic root normal in size. No mitral stenosis or regurgitation. No pulmonic stenosis or regurgitation with poorly visualized valve. No tricuspid regurgitation. Medium sized mobile vegetation noted on tricuspid valve. No pericardial effusion. CT HEAD W/O 7/16:  Progressive atrophy since 2011.  No acute intracranial findings. No abnormal enhancement or vasogenic edema to suggest septic emboli to the brain. VENOUS DUPLEX BILATERAL LOWER EXTREMITY 7/17:  No DVT or SVT.  CTA CHEST 7/24:   IMPRESSION: 1. Negative for a pulmonary embolism. 2. Extensive pleural-parenchymal lung disease bilaterally. Large right pleural effusion and moderate-sized left pleural effusion. Pleural-based nodular opacities could represent an infectious etiology but also raise concern for a neoplastic process. Concern for a cavitary lesion and necrotic tissue in the right lung. Consider sampling of the pleural fluid. Recommend follow-up CT when the pleural fluid and acute process have resolved to exclude neoplastic disease. 3. Severe emphysematous disease. 4. Cirrhosis with evidence for portal hypertension based on the splenomegaly and ascites. 5. 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 Recommend annual imaging followup by CTA or MRA. This recommendation follows 2010  ACCF/AHA/AATS/ACR/ASA/SCA/SCAI/SIR/STS/SVM Guidelines for the Diagnosis and Management of Patients with Thoracic Aortic Disease. Circulation. 2010; 121: G401-U272 VENOUS DUPLEX LUE 7/24:  Acute DVT involving small portion of left radial vein in mid-forearm.  CT HEAD W/O 7/26:  Normal head CT. Right Pleural Effusion 7/26:  1.5 L hazy yellow fluid by IR. Protein <3.0, Albumin <1.0, Amylase 39, Glucose 72, LDH 72, & WBC 2431 (11% lymph, 82% neutro, & 7% mono). Cytology negative for malignancy.  MRI BRAIN W/O 8/2:  Exam is limited to diffusion only. Negative for acute infarct. No area of restricted diffusion. EEG 8/2:  This EEG is abnormal due to moderate diffuse slowing of the background. Lingula BAL 8/3:  WBC 78 (39% lymph, 31% neutro, 30% mono). Reported DAH.  TTE 8/3:  LV normal in size with EF 55-60%. No regional wall motion abnormality &  grade 1 diastolic dysfunction. LA & RA normal in size. RV normal in size and function. No aortic stenosis or regurgitation. Aortic root normal in size. No mitral stenosis or regurgitation. No pulmonic stenosis. Trivial regurgitation. Trivial pericardial effusion as well. Notably no vegetations were seen on this exam. Right Pleural Effusion 8/5:  WBC 616 (1% lymph, 1% eos, & 98% neutro). PORT CXR 8/5:  Previously reviewed by me. Right-sided predominant opacities. Right-sided chest tube in place. Endotracheal tube in good position. Enteric feeding tube in good position. Right upper extremity PICC line in good position. Complete Abdominal U/S 8/6: IMPRESSION: 1. Stable cirrhotic changes involving the liver without definite focal hepatic lesion. 2. Mild common bile duct dilatation without obvious common bile duct stone or intrahepatic biliary dilatation. 3. Poor visualization of the pancreas. 4. Increased echogenicity of both kidneys suggesting medical renal disease. No hydronephrosis. 5. Large volume ascites and bilateral pleural effusions. Port CXR 8/7:  Previously  reviewed by me. Hazy opacities bilateral lower lung zones. Endotracheal tube & enteric feeding tube in good position. Right chest tube in good position.  PORT CXR 8/11:  Personally reviewed by me. Patchy hazy opacities bilaterally which are improving. No appreciable residual pleural effusion on the right. Right-sided chest tube in good position. Endotracheal tube and right upper extremity central venous catheter in good position. Enteric feeding tube coursing below diaphragm. PORT ABD X-RAY 8/11: IMPRESSION: 1. Paucity of bowel gas without evidence of enteric obstruction. 2. Enteric tube tip and side port project over the expected location of the mid body of the stomach. CT abd / pelv 8/12 > persistent bilateral pleural effusion (though improved), 3cm irregular rounded density in RLL.  Cirrhosis with splenomegaly and ascites, anasarca. Inflammatory vs infectious process involving duodenum.  MICROBIOLOGY: Blood Cultures x2 7/12:  2/2 Bottles MSSA & 1/2 Bottles Pseudomonas aeruginosa  CSF Culture 7/13:  Negative  MRSA PCR 7/13:  Negative HIV 7/13:  Negative Respiratory Viral Panel PCR 7/13:  Negative  Urine Culture 7/13:  Multiple Species Present Blood Cultures x2 7/14:  1/2 Bottles MSSA Blood Culture x1 7/15:  Negative  MRSA PCR 7/17:  Negative  Urine Culture 7/17:  Negative  Urine Streptococcal Antigen 7/17:  Negative Urine Legionella Antigen 7/17:  Negative  Right Pleural Fluid Culture 7/26 >>> Bacteria Negative / AFB pending / Fungus pending Lingula BAL 8/3:  Candida tropicalis  Blood Cultures x2 8/3:  Negative  Right Pleural Culture 8/5:  Negative  Blood Culture x2 8/6:  Negative   ANTIBIOTICS: Ceftriaxone 7/13 (x1 dose) Zosyn 7/13 (x1 dose) Nafcillin 7/13 - 7/16 Cefepime 7/13 - 8/6 Vancomycin 7/13 (x1 dose); restarted 8/3 - 8/7 Tressie Ellis 8/6 >>> (plan to continue through 8/25 per ID recs)  SIGNIFICANT EVENTS: 7/17 - Transfer to ICU for precedex infusion 7/21 - Precedex off   7/25 - PCCM signed off 8/02 - Patient hypothermic & more agitated 8/03 - acute tachypnea w/ "guppy breathing" >> emergently intubated w/ bronchoscopy showing "mild diffuse alveolar hemorrhage" 8/05 - Right chest tube inserted w/ 350 mL serous fluid. Bicarb gtt started for acidosis 8/06 - Switched off Cefepime to [place] given potential for worsening encephalopathy  8/08 - Albumin w/ Lasix today. Improving UOP.  8/09 - Albumin w/ Lasix q6hr x3 doses. Precedex started in an attempt to wean Fentanyl drip.  8/11 - N/V of bilious emesis overnight & continued diuresis 8/12 - Continued diuresis w/ Lasix & added Aldactone daily. Patient apneic during SBT. Decreasing chest tube output.  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 Currently on fortaz through 8/25 per ID Transudative Right Pleural Effusion - S/P Chest tube placement. 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   Treated Issues:  Acute hypoxic respiratory failure - Multifactorial.  Extubated 8/13 and tolerated well. Hemoptysis - Likely due to cavitary pneumonia. Improving. No bright red blood. 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  MSSA & Pseudomonas Bacteremia/Endocarditis LUE Radial Vein DVT Residual ALI  Right Lower Extremity Cellulitis - Resolved.  Toxic metabolic encephalopathy / delirium Hypernatremia  Hypomagnesemia, hypo K   Recs: Continue current diuretic regimen, goal daily net negative Follow chest tube output with improved volume status. 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 Will likely need to d/c tube in next several days, accepting high output and with knowledge that the R effusion is going to reaccumulate.  Follow intermittent CXR Continue ceftaz as per ID recs through  8/25 Swallowing eval > modified diet Atrovent as ordered  We will check on him Monday, probably look to d/c chest tube at that time depending on output. Call if we can assist sooner.   Baltazar Apo, MD, PhD 08/23/2016, 11:58 AM [facility] [phone] or if no answer [phone]

## 2016-08-23 NOTE — Progress Notes (Signed)
Patient sitting up in bed and attempting to get out of bed. States "I'm going home". Patient became verbally threatening when reoriented and asked to lay back in bed.  Dr. Toniann Fail called and informed of situation. Informed that Versed 1 mg given at 0227 with minimal result.  Order given for one time dose of Haldol 2mg  IV. Order repeated and placed.  Stated that if Haldol does not work then patient may be put in upper wrist restraints.

## 2016-08-23 NOTE — Progress Notes (Signed)
Physical Therapy Treatment Patient Details Name: Johnathan [name] MRN: [id number] DOB: Nov 11, 1970 Today's Date: 08/23/2016    History of Present Illness Pt admitted 7/13 with sepsis, MSSA bacteremia, B LE celllulitis, tricuspid valve vegetation, R LL cavity PNA with pleural effusion, hep C. Developed encephalopathy and agitation and transferred to ICU 7/17. + DVT LUE. Intubated 8/3-8/13; s/p CT placement 8/5 secondary to effusion. PMH: polysubstance abuse.    PT Comments    Pt tolerated Tx well with no limitations to pain or fatigue. Pt supervision with bed mobility with no physical assistance or cues. Pt min guard for safety with transfers with min cues, pt very impulsive with transfers. Pt Min assist with gait, pt very unsteady with RW with mod cues for safety. Pt did well with exercise and sitting balance activities. Next Tx to increase gait distance with decrease instability and continue with exercise plan to increase LE strength.  Follow Up Recommendations  SNF     Equipment Recommendations  Other (comment)    Recommendations for Other Services Speech consult     Precautions / Restrictions Precautions Precautions: Fall Precaution Comments: Chest tube Restrictions Weight Bearing Restrictions: No    Mobility  Bed Mobility Overal bed mobility: Needs Assistance       Supine to sit: Supervision     General bed mobility comments: Supervision for safety. No physical assist or cues required  Transfers Overall transfer level: Needs assistance Equipment used: Rolling walker (2 wheeled) Transfers: Sit to/from Stand Sit to Stand: Min guard         General transfer comment: Min guard for safety, pt very impulsive, multilpe cues for pt to stay seated on EOB, cues for hand placement   Ambulation/Gait Ambulation/Gait assistance: Min assist Ambulation Distance (Feet): 125 Feet Assistive device: Rolling walker (2 wheeled) Gait Pattern/deviations: Step-to  pattern;Decreased dorsiflexion - right;Decreased dorsiflexion - left;Scissoring;Staggering right;Trunk flexed;Narrow base of support Gait velocity: decreased   General Gait Details: min assist for safety, pt very unsteady, VC's for posture, looking ahead to avoid drift, heel strike/toe off    Stairs            Wheelchair Mobility    Modified Rankin (Stroke Patients Only)       Balance Overall balance assessment: Needs assistance Sitting-balance support: No upper extremity supported;Feet supported Sitting balance-Leahy Scale: Fair Sitting balance - Comments: Pt able to sit up on EOB with no UE support for 3 mins, Pt sat in reclining chair with no UE support while reaching inside/outside BOS with BUE index finger x10   Standing balance support: Bilateral upper extremity supported;During functional activity Standing balance-Leahy Scale: Poor Standing balance comment: Pt required UE support during activity                            Cognition Arousal/Alertness: Awake/alert Behavior During Therapy: WFL for tasks assessed/performed Overall Cognitive Status: Impaired/Different from baseline Area of Impairment: Safety/judgement;Attention                   Current Attention Level: Selective   Following Commands: Follows multi-step commands consistently Safety/Judgement: Decreased awareness of safety;Decreased awareness of deficits     General Comments: Pt mumbles at times, seems to understand cues given      Exercises General Exercises - Lower Extremity Straight Leg Raises: AROM;Both;10 reps;Seated Hip Flexion/Marching: AROM;Both;10 reps;Seated Other Exercises Other Exercises: Incentive spirometer 1x10 quality 1250 ML    General Comments  Pertinent Vitals/Pain Pain Assessment: No/denies pain    Home Living                      Prior Function            PT Goals (current goals can now be found in the care plan section) Acute  Rehab PT Goals Patient Stated Goal: pt did not state any goals PT Goal Formulation: Patient unable to participate in goal setting Time For Goal Achievement: 08/29/16 Potential to Achieve Goals: Fair Progress towards PT goals: Progressing toward goals    Frequency    Min 2X/week      PT Plan Current plan remains appropriate    Co-evaluation              AM-PAC PT "6 Clicks" Daily Activity  Outcome Measure  Difficulty turning over in bed (including adjusting bedclothes, sheets and blankets)?: None Difficulty moving from lying on back to sitting on the side of the bed? : None Difficulty sitting down on and standing up from a chair with arms (e.g., wheelchair, bedside commode, etc,.)?: A Little Help needed moving to and from a bed to chair (including a wheelchair)?: A Little Help needed walking in hospital room?: A Little Help needed climbing 3-5 steps with a railing? : A Lot 6 Click Score: 19    End of Session Equipment Utilized During Treatment: Gait belt Activity Tolerance: Patient tolerated treatment well Patient left: in chair;with call bell/phone within reach;with chair alarm set Nurse Communication: Mobility status PT Visit Diagnosis: Unsteadiness on feet (R26.81);Other symptoms and signs involving the nervous system (R29.898);Muscle weakness (generalized) (M62.81)     Time: 1638-4665 PT Time Calculation (min) (ACUTE ONLY): 22 min  Charges:  $Gait Training: 8-22 mins                    G Codes:       Charnae Lill, SPTA [id number]    Windsor Goeken 08/23/2016, 11:29 AM

## 2016-08-23 NOTE — Progress Notes (Signed)
0227Patient attempting to get up from bottom of bed.  Stated he is going home.  Attempted to reorient patient to place and situation.  Patient became agitated and verbally abusive.1 mg IV Versed given. Harriett Sine, CN called in to room to assist.  Patient refusing to get back in to bed and verbally threatening staff. Patient placed in bilateral wrist restraints and Dr. Toniann Fail paged to inform of situation.    0246: Dr. Toniann Fail returned page. Informed of situation and stated that previous haldol dose had decreased patients agitation. Order given for Haldol 2 mg q6 PRN.  Stated to given Haldol and remove wrist restraints.  Wrist restraints removed from patient and order placed.

## 2016-08-23 NOTE — Progress Notes (Signed)
[facility] Team Note:   Consult received for [employer] Spoke with Network engineer. Pt has been refusing care, not cooperative. Pt has been agitated and verbally abusive. Unable to place [facility] at this time.   [facility] team available tomorrow 8/18 if change in status  Romelle Starcher MS, RD, LDN [phone] Pager  [phone] Weekend/On-Call Pager

## 2016-08-23 NOTE — Progress Notes (Signed)
SLP Cancellation Note  Patient Details Name: Johnathan [name] MRN: [id number] DOB: [birth year]   Cancelled treatment:         Attempted to see pt for PO trials for potential diet advancement; however, pt declines all POs at this time despite encouragement and education provided by therapist. Will continue efforts.   Maxcine Ham 08/23/2016, 11:26 AM  Maxcine Ham, M.A. CCC-SLP [phone]

## 2016-08-23 NOTE — Progress Notes (Signed)
PT Progress Note    08/23/16 1032  PT Visit Information  Last PT Received On 08/23/16  Acute Rehab PT Goals  Time For Goal Achievement 08/29/16  Potential to Achieve Goals Fair   Conni Slipper, PT, DPT Acute Rehabilitation Services Pager: [phone]

## 2016-08-23 NOTE — Progress Notes (Signed)
PROGRESS NOTE    Johnathan [name]  ZDG:[id number] DOB: 05/15/1970 DOA: 07/19/2016   PCP: [name], MD   Brief Narrative:  46 yo Male PMHx Hep C and IVDA, Polysubstance Abuse, presented with one week duration of fevers, redness and swelling on the dorsum of the right foot. Pt with ongoing use of opioids, injected Opana 2 days PTA. Pt admitted with right foot cellulitis as well as RLL infiltrate, found to have MSSA bacteremia with possible Meningitis. On 7/17, pt developed acute encephalopathy with agitation, combative requiring precedex drip.   Subjective: Has been agitated and required placing on Haldol Q4 hours as needed. Still very confused and not oriented.   Assessment & Plan:   RLL cavitary PNA /- septic emboli v/s aspiration - Continue current antibiotic regimen: Will require long-term IV antibiotics.  - resp status stable    Hemoptysis / Diffuse alveolar hemorrhage - due to cavitary PNA   MSSA and Pseudomonas bacteremia w/ sepsis/Tricuspid valve Endocarditis  - CSF culture negative - Will require 6-8 weeks antibiotics. - Patient will require LTAC or SNF secondary to IV drug abuse patient cannot go home with long-term access - ABX per ID  Hepatitis C - liver cirrhosis  - Patient currently not candidate for treatment secondary to drug abuse - outpatient follow up with ID   LUE Radial Vein DVT  - Seen on duplex 7/24. No PE on CTA 7/24  Acute Encephalopathy - remains confused and combative . - requiring restraints and haldol  - lower sedating medications    Acute Renal Failure  - Cr  3.17 on admission: Secondary to dehydration.  - improving overall   Nausea w/ Bilious Emesis - ? Duodenitis - resolved   Hypokalemia - still low - Mg also low - will supplement - check BMP and Mg in AM  Hypernatremia - resolved   Drug abuse and dependence  - will need to consult for cessation    Protein calorie malnutrition - nutritionist consulted, appreciate  recommendations   Thrombocytopenia, anemia - of acute illness - monitor for now   DVT prophylaxis: SCD Code Status: Full Family Communication: None Disposition Plan: Requires LTAC or SNF  Consultants:  [facility]   Procedures/Significant Events:  7/13 - Admit to [facility] 7/17 - Transfer to ICU for precedex infusion 7/21 - Precedex off  7/22 - TRH resumed care 7/24 - venous duplex L UE radial vein DVT 7/25 - PCCM signed off 8/02 - Patient hypothermic & more agitated 8/03 - acute tachypnea w/ "guppy breathing" > emergently intubated w/ bronchoscopy showing "mild diffuse alveolar hemorrhage" 8/05 - Right chest tube inserted w/ 350 mL serous fluid. Bicarb gtt started for acidosis 8/06 - Switched off Cefepime to [place] given potential for worsening encephalopathy  8/08 - Albumin w/ Lasix today. Improving UOP.  8/09 - Albumin w/ Lasix q6hr x3 doses. Precedex started in an attempt to wean Fentanyl drip.  8/11 - N/V of bilious emesis overnight & continued diuresis 8/12 - Continued diuresis w/ Lasix & added Aldactone daily. Patient apneic during SBT. Decreasing chest tube output. 8/13 - extubated   RENAL U/S 7/13:   IMPRESSION: 1. Mild right pelvicaliectasis, with ureteral jet noted in the urinary bladder excluding significant ureteral obstruction. 2. Small left renal cyst. 3. Pleural effusions and abdominal ascites. LP 7/13:  Tube 1 - WBC 51 (62% lymph, 29% lymph, 9% mono), RBC 555, Protein 88 , & Glucose 52. / Tube 4 - WBC 180 (58% neutro, 31% lymph, 11% monocytes) &  RBC 36. TTE 7/14:  LV normal in size with EF 55-60%. No regional wall motion abnormalities & grade 1 diastolic dysfunction. LA upper limits of normal in size & RA normal in size. RV normal in size and function. No aortic stenosis or regurgitation. Aortic root normal in size. No mitral stenosis or regurgitation. No pulmonic stenosis or regurgitation with poorly visualized valve. No tricuspid regurgitation. Medium sized mobile  vegetation noted on tricuspid valve. No pericardial effusion. CT HEAD W/O 7/16:  Progressive atrophy since 2011.  No acute intracranial findings. No abnormal enhancement or vasogenic edema to suggest septic emboli to the brain. VENOUS DUPLEX BILATERAL LOWER EXTREMITY 7/17:  No DVT or SVT.  CTA CHEST 7/24:  Negative for a pulmonary embolism. Extensive pleural-parenchymal lung disease bilaterally. Large right pleural effusion and moderate-sized left pleural effusion. Pleural-based nodular opacities could represent an infectious etiology but also raise concern for a neoplastic process. Concern for a cavitary lesion and necrotic tissue in the right lung. Consider sampling of the pleural fluid. Recommend follow-up CT when the pleural fluid and acute process have resolved to exclude neoplastic disease. Severe emphysematous disease. Cirrhosis with evidence for portal hypertension based on the splenomegaly and ascites. 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  VENOUS DUPLEX LUE 7/24:  Acute DVT involving small portion of left radial vein in mid-forearm.  CT HEAD W/O 7/26:  Normal head CT. Right Pleural Effusion 7/26:  1.5 L hazy yellow fluid by IR. Protein <3.0, Albumin <1.0, Amylase 39, Glucose 72, LDH 72, & WBC 2431 (11% lymph, 82% neutro, & 7% mono). Cytology negative for malignancy.  MRI BRAIN W/O 8/2:  Exam is limited to diffusion only. Negative for acute infarct. No area of restricted diffusion. EEG 8/2:  This EEG is abnormal due to moderate diffuse slowing of the background. Lingula BAL 8/3:  WBC 78 (39% lymph, 31% neutro, 30% mono). Reported DAH.  TTE 8/3:  LV normal in size with EF 55-60%. No regional wall motion abnormality & grade 1 diastolic dysfunction. LA & RA normal in size. RV normal in size and function. No aortic stenosis or regurgitation. Aortic root normal in size. No mitral stenosis or regurgitation. No pulmonic stenosis. Trivial regurgitation. Trivial pericardial  effusion as well. Notably no vegetations were seen on this exam. Right Pleural Effusion 8/5:  WBC 616 (1% lymph, 1% eos, & 98% neutro). PORT CXR 8/5:  Previously reviewed by me. Right-sided predominant opacities. Right-sided chest tube in place. Endotracheal tube in good position. Enteric feeding tube in good position. Right upper extremity PICC line in good position. Complete Abdominal U/S 8/6: Stable cirrhotic changes involving the liver without definite focal hepatic lesion. Mild common bile duct dilatation without obvious common bile duct stone or intrahepatic biliary dilatation. Poor visualization of the pancreas. Increased echogenicity of both kidneys suggesting medical renal disease. No hydronephrosis. Large volume ascites and bilateral pleural effusions. Port CXR 8/7:  Previously reviewed by me. Hazy opacities bilateral lower lung zones. Endotracheal tube & enteric feeding tube in good position. Right chest tube in good position.  PORT CXR 8/11:  Personally reviewed by me. Patchy hazy opacities bilaterally which are improving. No appreciable residual pleural effusion on the right. Right-sided chest tube in good position. Endotracheal tube and right upper extremity central venous catheter in good position. Enteric feeding tube coursing below diaphragm. PORT ABD X-RAY 8/11: Paucity of bowel gas without evidence of enteric obstruction. Enteric tube tip and side port project over the expected location of  the mid body of the stomach. CT abd / pelv 8/12 > persistent bilateral pleural effusion (though improved), 3cm irregular rounded density in RLL.  Cirrhosis with splenomegaly and ascites, anasarca. Inflammatory vs infectious process involving duodenum  Cultures Blood Cultures x2 7/12:  2/2 Bottles MSSA & 1/2 Bottles Pseudomonas aeruginosa  CSF Culture 7/13:  Negative  MRSA PCR 7/13:  Negative HIV 7/13:  Negative Respiratory Viral Panel PCR 7/13:  Negative  Urine Culture 7/13:  Multiple Species  Present Blood Cultures x2 7/14:  1/2 Bottles MSSA Blood Culture x1 7/15:  Negative  MRSA PCR 7/17:  Negative  Urine Culture 7/17:  Negative  Urine Streptococcal Antigen 7/17:  Negative Urine Legionella Antigen 7/17:  Negative  Right Pleural Fluid Culture 7/26 >>> Bacteria Negative / AFB pending / Fungus pending Lingula BAL 8/3:  Candida tropicalis  Blood Cultures x2 8/3:  Negative  Right Pleural Culture 8/5:  Negative  Blood Culture x2 8/6:  Negative   Antimicrobials: Anti-infectives    Start     Stop   08/22/16 1100  rifaximin (XIFAXAN) tablet 550 mg         08/19/16 1000  cefTAZidime (FORTAZ) 1 g in dextrose 5 % 50 mL IVPB     08/31/16 2359   08/12/16 1100  cefTAZidime (FORTAZ) 2 g in dextrose 5 % 50 mL IVPB  Status:  Discontinued     08/19/16 0555   08/11/16 1000  ceFEPIme (MAXIPIME) 1 g in dextrose 5 % 50 mL IVPB  Status:  Discontinued     08/12/16 0936   08/10/16 1500  vancomycin (VANCOCIN) 1,250 mg in sodium chloride 0.9 % 250 mL IVPB  Status:  Discontinued     08/11/16 0930   08/10/16 1000  ceFEPIme (MAXIPIME) 2 g in dextrose 5 % 50 mL IVPB  Status:  Discontinued     08/11/16 0930   08/09/16 1300  vancomycin (VANCOCIN) 1,500 mg in sodium chloride 0.9 % 500 mL IVPB     08/09/16 1602   07/22/16 1400  ceFEPIme (MAXIPIME) 2 g in dextrose 5 % 50 mL IVPB  Status:  Discontinued     08/09/16 1226   07/20/16 1500  ceFEPIme (MAXIPIME) 2 g in dextrose 5 % 50 mL IVPB  Status:  Discontinued     07/22/16 1122   07/19/16 2300  vancomycin (VANCOCIN) IVPB 750 mg/150 ml premix  Status:  Discontinued     07/19/16 0953   07/19/16 2200  ceFEPIme (MAXIPIME) 2 g in dextrose 5 % 50 mL IVPB  Status:  Discontinued     07/20/16 1410   07/19/16 2000  nafcillin 2 g in dextrose 5 % 100 mL IVPB  Status:  Discontinued     07/22/16 1130   07/19/16 1915  nafcillin injection 2 g  Status:  Discontinued     07/19/16 1915   07/19/16 1800  ceFEPIme (MAXIPIME) 2 g in dextrose 5 % 50 mL IVPB  Status:   Discontinued     07/19/16 1912   07/19/16 1100  vancomycin (VANCOCIN) 500 mg in sodium chloride 0.9 % 100 mL IVPB  Status:  Discontinued     07/19/16 1645   07/19/16 0800  piperacillin-tazobactam (ZOSYN) IVPB 3.375 g  Status:  Discontinued     07/19/16 1645   07/19/16 0200  cefTRIAXone (ROCEPHIN) 2 g in dextrose 5 % 50 mL IVPB     07/19/16 0259   07/19/16 0130  cefTRIAXone (ROCEPHIN) 1 g in dextrose 5 % 50 mL IVPB  Status:  Discontinued     07/19/16 0140   07/19/16 0115  piperacillin-tazobactam (ZOSYN) IVPB 3.375 g     07/19/16 0219   07/19/16 0115  vancomycin (VANCOCIN) IVPB 1000 mg/200 mL premix     07/19/16 0243     LINES / TUBES:  OETT 8/3 >>>8/13 Rt CHEST TUBE 8/5 >>> RUE DL PICC 7/27 >>> Foley >>>  Continuous Infusions: . cefTAZidime (FORTAZ)  IV 1 g (08/23/16 1118)  . dextrose    . dextrose 125 mL (08/23/16 0814)   Objective: Vitals:   08/22/16 1800 08/22/16 2019 08/22/16 2321 08/23/16 0740  BP: (!) 141/97 (!) 163/81 (!) 181/83 (!) 140/94  Pulse: 79 64 70 68  Resp: (!) 30 (!) 26 (!) 25 20  Temp:  97.7 F (36.5 C) (!) 97.5 F (36.4 C) 98.5 F (36.9 C)  TempSrc:  Oral Oral Oral  SpO2: 98% 100% 97% 98%  Weight:   72.9 kg (160 lb 11.5 oz)   Height:        Intake/Output Summary (Last 24 hours) at 08/23/16 1641 Last data filed at 08/23/16 1340  Gross per 24 hour  Intake           2812.5 ml  Output               90 ml  Net           2722.5 ml   Filed Weights   08/21/16 0500 08/22/16 0351 08/22/16 2321  Weight: 75 kg (165 lb 5.5 oz) 72.6 kg (160 lb 0.9 oz) 72.9 kg (160 lb 11.5 oz)   Physical Exam  Constitutional: sleeping but can awake, very confused  CVS: RRR, S1/S2 +, no murmurs, no gallops, no carotid bruit.  Pulmonary: R chest tube in place, crackles on the right side Abdominal: Soft. BS +,  no distension, tenderness, rebound or guarding.  Neuro: Very confused, moving all 4 extremities spont  Data Reviewed: I have reviewed pt's medical record to date,  current blood work and trending, diagnostic studies.   CBC:  Recent Labs Lab 2016/08/20 0416 08/20/16 0513 08/21/16 0457 08/22/16 0405 08/23/16 0431  WBC 8.6 8.3 12.7* 15.9* 9.7  NEUTROABS 5.4 5.2 10.1* 12.7* 6.8  HGB 8.5* 8.4* 8.3* 7.9* 7.6*  HCT 26.8* 26.6* 26.5* 26.0* 24.8*  MCV 88.2 88.7 91.1 90.9 91.2  PLT 81* 90* 96* 88* 85*   Basic Metabolic Panel:  Recent Labs Lab 08/20/16 0416  08/20/16 0513  08/21/16 0457 08/21/16 1912 08/22/16 0405 08/22/16 1610 08/23/16 0431  NA 145  < > 148*  < > 152* 147* 153*  154* 149* 145  K 3.4*  < > 3.2*  < > 3.3* 3.2* 3.4*  3.4* 3.3* 3.3*  CL 111  < > 113*  < > 115* 113* 116*  116* 113* 111  CO2 26  < > 27  < > _0 GLUCOSE 115*  < > 122*  < > 97 231* 111*  111* 129* 126*  BUN 69*  < > 58*  < > 46* 37* 34*  34* 32* 31*  CREATININE 3.15*  < > 2.65*  < > 2.13* 1.98* 2.04*  2.00* 2.12* 2.05*  CALCIUM 7.8*  < > 7.8*  < > 7.8* 7.3* 7.7*  7.7* 7.7* 7.5*  MG 1.8  --  1.7  --  1.6*  --  1.7  --  1.4*  PHOS 4.6  < > 4.7*  4.7*  < >  4.4 3.9 4.4 4.2 3.7  < > = values in this interval not displayed.  Liver Function Tests:  Recent Labs Lab 08/17/16 1007  08/21/16 0457 08/21/16 1912 08/22/16 0405 08/22/16 1610 08/23/16 0431  AST 28  --   --   --   --   --   --   ALT 17  --   --   --   --   --   --   ALKPHOS 78  --   --   --   --   --   --   BILITOT 0.7  --   --   --   --   --   --   PROT 5.3*  --   --   --   --   --   --   ALBUMIN 1.3*  < > 1.4* 1.3* 1.4* 1.4* 1.4*  < > = values in this interval not displayed.  Recent Labs Lab 08/17/16 1007  LIPASE 125*  AMYLASE 116*   Coagulation Profile:  Recent Labs Lab 08/19/16 0416 08/20/16 0513 08/21/16 0457 08/22/16 0405 08/23/16 0431  INR 1.38 1.34 1.42 1.56 1.43   CBG:  Recent Labs Lab 08/22/16 0329 08/22/16 0750 08/22/16 1148 08/22/16 1612 08/22/16 2017  GLUCAP 103* 106* 115* 110* 131*   Urine analysis:    Component Value Date/Time    COLORURINE RED (A) 08/22/2016 1015   APPEARANCEUR CLOUDY (A) 08/22/2016 1015   LABSPEC 1.009 08/22/2016 1015   PHURINE 6.0 08/22/2016 1015   GLUCOSEU (A) 08/22/2016 1015    TEST NOT REPORTED DUE TO COLOR INTERFERENCE OF URINE PIGMENT   HGBUR (A) 08/22/2016 1015    TEST NOT REPORTED DUE TO COLOR INTERFERENCE OF URINE PIGMENT   BILIRUBINUR (A) 08/22/2016 1015    TEST NOT REPORTED DUE TO COLOR INTERFERENCE OF URINE PIGMENT   KETONESUR (A) 08/22/2016 1015    TEST NOT REPORTED DUE TO COLOR INTERFERENCE OF URINE PIGMENT   PROTEINUR (A) 08/22/2016 1015    TEST NOT REPORTED DUE TO COLOR INTERFERENCE OF URINE PIGMENT   NITRITE (A) 08/22/2016 1015    TEST NOT REPORTED DUE TO COLOR INTERFERENCE OF URINE PIGMENT   LEUKOCYTESUR (A) 08/22/2016 1015    TEST NOT REPORTED DUE TO COLOR INTERFERENCE OF URINE PIGMENT   Radiology Studies: Dg Chest Port 1 View  Result Date: 08/23/2016 CLINICAL DATA:  08/22/2016. EXAM: PORTABLE CHEST 1 VIEW COMPARISON:  Chest x-ray 16 2018 . FINDINGS: Right PICC line right chest tube in stable position. Heart size normal. Mild subsegmental atelectasis and or infiltrate right mid lung. No pleural effusion or pneumothorax. IMPRESSION: Right PICC line right chest tube in stable position. No pneumothorax . Mild subsegmental atelectasis right mid lung. Electronically Signed   By: Marcello Moores  Register   On: 08/23/2016 09:52   Dg Chest Port 1 View  Result Date: 08/22/2016 CLINICAL DATA:  Respiratory failure EXAM: PORTABLE CHEST 1 VIEW COMPARISON:  08/21/2016 FINDINGS: Cardiac shadow is stable. Right-sided PICC line is noted in satisfactory position. Right chest tube is noted as well. No pneumothorax is seen. Mild vascular congestion is again noted. Stable right basilar atelectasis is noted. No bony abnormality is seen. IMPRESSION: Stable appearance of the chest when compare with the previous day. Electronically Signed   By: Inez Catalina M.D.   On: 08/22/2016 11:17      Scheduled  Meds: . Chlorhexidine Gluconate Cloth  6 each Topical Daily  . feeding supplement (ENSURE ENLIVE)  237 mL Oral BID  BM  . folic acid  1 mg Oral Daily  . Gerhardt's butt cream   Topical Daily  . hydrALAZINE  50 mg Oral Q8H  . lactulose  30 g Oral Daily  . mouth rinse  15 mL Mouth Rinse q12n4p  . multivitamin  15 mL Oral Daily  . pantoprazole  40 mg Oral Daily  . rifaximin  550 mg Oral BID  . risperiDONE  0.5 mg Oral BID  . sodium chloride flush  10-40 mL Intracatheter Q12H  . sodium chloride flush  3 mL Intravenous Q12H  . spironolactone  25 mg Oral Daily  . thiamine  100 mg Oral Daily   Continuous Infusions: . cefTAZidime (FORTAZ)  IV 1 g (08/23/16 1118)  . dextrose    . dextrose 125 mL (08/23/16 0814)     LOS: 35 days   Time spent: 35 minutes  Faye Ramsay, MD  [facility] Pager [phone]  If 7PM-7AM, please contact night-coverage [email or web address] Password [facility] 08/23/2016, 4:41 PM

## 2016-08-24 LAB — BASIC METABOLIC PANEL
ANION GAP: 6 (ref 5–15)
BUN: 23 mg/dL — ABNORMAL HIGH (ref 6–20)
CHLORIDE: 106 mmol/L (ref 101–111)
CO2: 27 mmol/L (ref 22–32)
Calcium: 7.3 mg/dL — ABNORMAL LOW (ref 8.9–10.3)
Creatinine, Ser: 1.88 mg/dL — ABNORMAL HIGH (ref 0.61–1.24)
GFR calc non Af Amer: 42 mL/min — ABNORMAL LOW (ref >60)
GFR, EST AFRICAN AMERICAN: 48 mL/min — AB (ref >60)
Glucose, Bld: 98 mg/dL (ref 65–99)
POTASSIUM: 3.2 mmol/L — AB (ref 3.5–5.1)
Sodium: 139 mmol/L (ref 135–145)

## 2016-08-24 LAB — PHOSPHORUS: PHOSPHORUS: 3.3 mg/dL (ref 2.5–4.6)

## 2016-08-24 LAB — ALBUMIN: ALBUMIN: 1.4 g/dL — AB (ref 3.5–5.0)

## 2016-08-24 LAB — MAGNESIUM: Magnesium: 1.7 mg/dL (ref 1.7–2.4)

## 2016-08-24 MED ORDER — DEXTROSE 5 % IV SOLN
1.0000 g | Freq: Three times a day (TID) | INTRAVENOUS | Status: DC
  Administered 2016-08-24 – 2016-08-26 (×6): 1 g via INTRAVENOUS
  Filled 2016-08-24 (×6): qty 1

## 2016-08-24 MED ORDER — DEXTROSE 5 % IV SOLN
2.0000 g | Freq: Three times a day (TID) | INTRAVENOUS | Status: DC
  Filled 2016-08-24 (×2): qty 2

## 2016-08-24 NOTE — Progress Notes (Signed)
Pt refused blood draw. On call Anne Fu made aware via text page.

## 2016-08-24 NOTE — Progress Notes (Addendum)
Pharmacy Antibiotic Note  Johnathan [name] is a 46 year old male with hx Hep C, IVDA who presented on 7/13 with R-foot redness/swelling with fevers. The patient is noted to have MSSA + Psuedomonas bacteremia with TTE positive for tricuspid valve IE. ID recommends treatment through the 25th..   Continue on ceftaz for MSSA + PSA bacteremia/endocarditis with concern for septic emboli to brain. With vegetations on TTE- no TEE req'd per [place]. Afebrile, WBC wnl. SCr down to 1.99, CrCl ~46m/min.   Plan: Increase ceftazidime to 1g IV Q8h thru the 25th Monitor clinical picture, renal function  Height: _0  (180.3 cm) Weight: 161 lb 2.5 oz (73.1 kg) IBW/kg (Calculated) : 75.3  Temp (24hrs), Avg:98.2 F (36.8 C), Min:98 F (36.7 C), Max:98.5 F (36.9 C)   Recent Labs Lab 08/19/16 0416  08/20/16 0513  08/21/16 0457 08/21/16 1912 08/22/16 0405 08/22/16 1610 08/23/16 0431 08/23/16 1822  WBC 8.6  --  8.3  --  12.7*  --  15.9*  --  9.7  --   CREATININE 3.15*  < > 2.65*  < > 2.13* 1.98* 2.04*  2.00* 2.12* 2.05* 1.99*  < > = values in this interval not displayed.    Antibiotics on this admission: Zosyn 7/13 >> 7/13 Vanc 7/13 >> 7/13; 8/3 >>8/7 Cefepime 7/13 >>8/6 Nafcillin 7/13 >> 7/16 Ceftazidime 8/6>> [8/25]  Dose Adjustments made 08/13 Ceftazidime 2gm Q24h changed to 1gm Q12h due to improved renal function 8/18 Increase ceftazidime to 2g IV Q8h  Cultures: 7/12 BCx - MSSA + PSA (pan sensitive) 7/13 CSF cx - negative 7/13 RVP- negative 7/13 MRSA PCR - negative 7/13 UCx - negative 7/14 BCx - 1/2 MSSA 7/15 BCx - neg 7/17 Urine- neg 7/26 pleural fluid- gram stain neg, cx ngF 7/26 AFB- neg 7/26 Fungal:neg 8/3 blood cx: pending 8/3 BAL: NGTD 8/3 Blood - NGTD  NElenor Quinones PharmD, BCPS Clinical Pharmacist Pager [phone] 34148/18/2018 7:28 AM

## 2016-08-24 NOTE — Progress Notes (Signed)
Pt refused to eat breakfast and lunch his diet is Dysphagia I ,pt prefer chicken wings informed Dr. Izola Price, Sherlon Handing and ordered regular diet , he ate 25% , no vomiting noted.

## 2016-08-24 NOTE — Progress Notes (Signed)
Pt BP was 182/83 rechecked BP 156/96 , asymptomatic.

## 2016-08-24 NOTE — Procedures (Signed)
Pt refused blood draw, cussed and told me to leave his room. Carla RN aware.

## 2016-08-24 NOTE — Progress Notes (Addendum)
PROGRESS NOTE  Johnathan [name]  VWU:[id number] DOB: [birth year] DOA: 07/19/2016   PCP: [name], MD   Brief Narrative:  46 yo Male PMHx Hep C and IVDA, Polysubstance Abuse, presented with one week duration of fevers, redness and swelling on the dorsum of the right foot. Pt with ongoing use of opioids, injected Opana 2 days PTA. Pt admitted with right foot cellulitis as well as RLL infiltrate, found to have MSSA bacteremia with possible Meningitis. On 7/17, pt developed acute encephalopathy with agitation, combative requiring precedex drip.   Subjective: Remains combative   Assessment & Plan:   RLL cavitary PNA /- septic emboli v/s aspiration - Continue current antibiotic regimen: Will require long-term IV antibiotics.  - resp status stable    Hemoptysis / Diffuse alveolar hemorrhage - due to cavitary PNA   MSSA and Pseudomonas bacteremia w/ sepsis/Tricuspid valve Endocarditis  - CSF culture negative - Will require 6-8 weeks antibiotics. - Patient will require LTAC or SNF secondary to IV drug abuse patient cannot go home with long-term access - ABX per ID  Hepatitis C - liver cirrhosis  - Patient currently not candidate for treatment secondary to drug abuse - outpatient follow up with ID   LUE Radial Vein DVT  - Seen on duplex 7/24. No PE on CTA 7/24  Acute Encephalopathy - remains combative, refusing care  - haldol as needed    Acute Renal Failure  - Cr  3.17 on admission: Secondary to dehydration.  - no blood work done per pt's request   Nausea w/ Bilious Emesis - ? Duodenitis - resolved   Hypokalemia - with low mg, pt declined to take medication and is refusing blood work   Hypernatremia - has resolved but no follow up test as pt has refused blood work  Drug abuse and dependence  - will need to consult for cessation once pt agreeable   Protein calorie malnutrition - nutritionist consulted, appreciate recommendations   Thrombocytopenia,  anemia - of acute illness - pt refusing blood work   DVT prophylaxis: SCD Code Status: Full Family Communication: None Disposition Plan: Requires LTAC or SNF  Consultants:  [facility]   Procedures/Significant Events:  7/13 - Admit to [facility] 7/17 - Transfer to ICU for precedex infusion 7/21 - Precedex off  7/22 - TRH resumed care 7/24 - venous duplex L UE radial vein DVT 7/25 - PCCM signed off 8/02 - Patient hypothermic & more agitated 8/03 - acute tachypnea w/ "guppy breathing" > emergently intubated w/ bronchoscopy showing "mild diffuse alveolar hemorrhage" 8/05 - Right chest tube inserted w/ 350 mL serous fluid. Bicarb gtt started for acidosis 8/06 - Switched off Cefepime to [place] given potential for worsening encephalopathy  8/08 - Albumin w/ Lasix today. Improving UOP.  8/09 - Albumin w/ Lasix q6hr x3 doses. Precedex started in an attempt to wean Fentanyl drip.  8/11 - N/V of bilious emesis overnight & continued diuresis 8/12 - Continued diuresis w/ Lasix & added Aldactone daily. Patient apneic during SBT. Decreasing chest tube output. 8/13 - extubated   RENAL U/S 7/13:   IMPRESSION: 1. Mild right pelvicaliectasis, with ureteral jet noted in the urinary bladder excluding significant ureteral obstruction. 2. Small left renal cyst. 3. Pleural effusions and abdominal ascites. LP 7/13:  Tube 1 - WBC 51 (62% lymph, 29% lymph, 9% mono), RBC 555, Protein 88 , & Glucose 52. / Tube 4 - WBC 180 (58% neutro, 31% lymph, 11% monocytes) & RBC 36. TTE 7/14:  LV  normal in size with EF 55-60%. No regional wall motion abnormalities & grade 1 diastolic dysfunction. LA upper limits of normal in size & RA normal in size. RV normal in size and function. No aortic stenosis or regurgitation. Aortic root normal in size. No mitral stenosis or regurgitation. No pulmonic stenosis or regurgitation with poorly visualized valve. No tricuspid regurgitation. Medium sized mobile vegetation noted on tricuspid  valve. No pericardial effusion. CT HEAD W/O 7/16:  Progressive atrophy since 2011.  No acute intracranial findings. No abnormal enhancement or vasogenic edema to suggest septic emboli to the brain. VENOUS DUPLEX BILATERAL LOWER EXTREMITY 7/17:  No DVT or SVT.  CTA CHEST 7/24:  Negative for a pulmonary embolism. Extensive pleural-parenchymal lung disease bilaterally. Large right pleural effusion and moderate-sized left pleural effusion. Pleural-based nodular opacities could represent an infectious etiology but also raise concern for a neoplastic process. Concern for a cavitary lesion and necrotic tissue in the right lung. Consider sampling of the pleural fluid. Recommend follow-up CT when the pleural fluid and acute process have resolved to exclude neoplastic disease. Severe emphysematous disease. Cirrhosis with evidence for portal hypertension based on the splenomegaly and ascites. 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  VENOUS DUPLEX LUE 7/24:  Acute DVT involving small portion of left radial vein in mid-forearm.  CT HEAD W/O 7/26:  Normal head CT. Right Pleural Effusion 7/26:  1.5 L hazy yellow fluid by IR. Protein <3.0, Albumin <1.0, Amylase 39, Glucose 72, LDH 72, & WBC 2431 (11% lymph, 82% neutro, & 7% mono). Cytology negative for malignancy.  MRI BRAIN W/O 8/2:  Exam is limited to diffusion only. Negative for acute infarct. No area of restricted diffusion. EEG 8/2:  This EEG is abnormal due to moderate diffuse slowing of the background. Lingula BAL 8/3:  WBC 78 (39% lymph, 31% neutro, 30% mono). Reported DAH.  TTE 8/3:  LV normal in size with EF 55-60%. No regional wall motion abnormality & grade 1 diastolic dysfunction. LA & RA normal in size. RV normal in size and function. No aortic stenosis or regurgitation. Aortic root normal in size. No mitral stenosis or regurgitation. No pulmonic stenosis. Trivial regurgitation. Trivial pericardial effusion as well. Notably no  vegetations were seen on this exam. Right Pleural Effusion 8/5:  WBC 616 (1% lymph, 1% eos, & 98% neutro). PORT CXR 8/5:  Previously reviewed by me. Right-sided predominant opacities. Right-sided chest tube in place. Endotracheal tube in good position. Enteric feeding tube in good position. Right upper extremity PICC line in good position. Complete Abdominal U/S 8/6: Stable cirrhotic changes involving the liver without definite focal hepatic lesion. Mild common bile duct dilatation without obvious common bile duct stone or intrahepatic biliary dilatation. Poor visualization of the pancreas. Increased echogenicity of both kidneys suggesting medical renal disease. No hydronephrosis. Large volume ascites and bilateral pleural effusions. Port CXR 8/7:  Previously reviewed by me. Hazy opacities bilateral lower lung zones. Endotracheal tube & enteric feeding tube in good position. Right chest tube in good position.  PORT CXR 8/11:  Personally reviewed by me. Patchy hazy opacities bilaterally which are improving. No appreciable residual pleural effusion on the right. Right-sided chest tube in good position. Endotracheal tube and right upper extremity central venous catheter in good position. Enteric feeding tube coursing below diaphragm. PORT ABD X-RAY 8/11: Paucity of bowel gas without evidence of enteric obstruction. Enteric tube tip and side port project over the expected location of the mid body of the stomach.  CT abd / pelv 8/12 > persistent bilateral pleural effusion (though improved), 3cm irregular rounded density in RLL.  Cirrhosis with splenomegaly and ascites, anasarca. Inflammatory vs infectious process involving duodenum  Cultures Blood Cultures x2 7/12:  2/2 Bottles MSSA & 1/2 Bottles Pseudomonas aeruginosa  CSF Culture 7/13:  Negative  MRSA PCR 7/13:  Negative HIV 7/13:  Negative Respiratory Viral Panel PCR 7/13:  Negative  Urine Culture 7/13:  Multiple Species Present Blood Cultures x2 7/14:   1/2 Bottles MSSA Blood Culture x1 7/15:  Negative  MRSA PCR 7/17:  Negative  Urine Culture 7/17:  Negative  Urine Streptococcal Antigen 7/17:  Negative Urine Legionella Antigen 7/17:  Negative  Right Pleural Fluid Culture 7/26 >>> Bacteria Negative / AFB pending / Fungus pending Lingula BAL 8/3:  Candida tropicalis  Blood Cultures x2 8/3:  Negative  Right Pleural Culture 8/5:  Negative  Blood Culture x2 8/6:  Negative   Antimicrobials: Anti-infectives    Start     Stop   08/22/16 1100  rifaximin (XIFAXAN) tablet 550 mg         08/19/16 1000  cefTAZidime (FORTAZ) 1 g in dextrose 5 % 50 mL IVPB     08/31/16 2359   08/12/16 1100  cefTAZidime (FORTAZ) 2 g in dextrose 5 % 50 mL IVPB  Status:  Discontinued     08/19/16 0555   08/11/16 1000  ceFEPIme (MAXIPIME) 1 g in dextrose 5 % 50 mL IVPB  Status:  Discontinued     08/12/16 0936   08/10/16 1500  vancomycin (VANCOCIN) 1,250 mg in sodium chloride 0.9 % 250 mL IVPB  Status:  Discontinued     08/11/16 0930   08/10/16 1000  ceFEPIme (MAXIPIME) 2 g in dextrose 5 % 50 mL IVPB  Status:  Discontinued     08/11/16 0930   08/09/16 1300  vancomycin (VANCOCIN) 1,500 mg in sodium chloride 0.9 % 500 mL IVPB     08/09/16 1602   07/22/16 1400  ceFEPIme (MAXIPIME) 2 g in dextrose 5 % 50 mL IVPB  Status:  Discontinued     08/09/16 1226   07/20/16 1500  ceFEPIme (MAXIPIME) 2 g in dextrose 5 % 50 mL IVPB  Status:  Discontinued     07/22/16 1122   07/19/16 2300  vancomycin (VANCOCIN) IVPB 750 mg/150 ml premix  Status:  Discontinued     07/19/16 0953   07/19/16 2200  ceFEPIme (MAXIPIME) 2 g in dextrose 5 % 50 mL IVPB  Status:  Discontinued     07/20/16 1410   07/19/16 2000  nafcillin 2 g in dextrose 5 % 100 mL IVPB  Status:  Discontinued     07/22/16 1130   07/19/16 1915  nafcillin injection 2 g  Status:  Discontinued     07/19/16 1915   07/19/16 1800  ceFEPIme (MAXIPIME) 2 g in dextrose 5 % 50 mL IVPB  Status:  Discontinued     07/19/16 1912    07/19/16 1100  vancomycin (VANCOCIN) 500 mg in sodium chloride 0.9 % 100 mL IVPB  Status:  Discontinued     07/19/16 1645   07/19/16 0800  piperacillin-tazobactam (ZOSYN) IVPB 3.375 g  Status:  Discontinued     07/19/16 1645   07/19/16 0200  cefTRIAXone (ROCEPHIN) 2 g in dextrose 5 % 50 mL IVPB     07/19/16 0259   07/19/16 0130  cefTRIAXone (ROCEPHIN) 1 g in dextrose 5 % 50 mL IVPB  Status:  Discontinued  07/19/16 0140   07/19/16 0115  piperacillin-tazobactam (ZOSYN) IVPB 3.375 g     07/19/16 0219   07/19/16 0115  vancomycin (VANCOCIN) IVPB 1000 mg/200 mL premix     07/19/16 0243     LINES / TUBES:  OETT 8/3 >>>8/13 Rt CHEST TUBE 8/5 >>> RUE DL PICC 7/27 >>> Foley >>>  Continuous Infusions: . cefTAZidime (FORTAZ)  IV    . dextrose    . dextrose 75 mL/hr at 08/24/16 0303   Objective: Vitals:   08/23/16 0740 08/23/16 2131 08/24/16 0500 08/24/16 0601  BP: (!) 140/94 (!) 141/77  (!) 143/81  Pulse: 68 65  64  Resp: _0 Temp: 98.5 F (36.9 C) 98 F (36.7 C)  98.1 F (36.7 C)  TempSrc: Oral Axillary  Axillary  SpO2: 98% 99%  99%  Weight:   73.1 kg (161 lb 2.5 oz)   Height:        Intake/Output Summary (Last 24 hours) at 08/24/16 1046 Last data filed at 08/24/16 1009  Gross per 24 hour  Intake          3119.67 ml  Output              450 ml  Net          2669.67 ml   Filed Weights   08/22/16 0351 08/22/16 2321 08/24/16 0500  Weight: 72.6 kg (160 lb 0.9 oz) 72.9 kg (160 lb 11.5 oz) 73.1 kg (161 lb 2.5 oz)   Pt has refused exam this AM   Data Reviewed: I have reviewed pt's blood work and vital signs, pt has declined to discuss   CBC:  Recent Labs Lab 08/19/16 0416 08/20/16 0513 08/21/16 0457 08/22/16 0405 08/23/16 0431  WBC 8.6 8.3 12.7* 15.9* 9.7  NEUTROABS 5.4 5.2 10.1* 12.7* 6.8  HGB 8.5* 8.4* 8.3* 7.9* 7.6*  HCT 26.8* 26.6* 26.5* 26.0* 24.8*  MCV 88.2 88.7 91.1 90.9 91.2  PLT 81* 90* 96* 88* 85*   Basic Metabolic Panel:  Recent Labs Lab  08/19/16 0416  08/20/16 0513  08/21/16 0457 08/21/16 1912 08/22/16 0405 08/22/16 1610 08/23/16 0431 08/23/16 1822  NA 145  < > 148*  < > 152* 147* 153*  154* 149* 145 142  K 3.4*  < > 3.2*  < > 3.3* 3.2* 3.4*  3.4* 3.3* 3.3* 3.1*  CL 111  < > 113*  < > 115* 113* 116*  116* 113* 111 108  CO2 26  < > 27  < > _1 GLUCOSE 115*  < > 122*  < > 97 231* 111*  111* 129* 126* 114*  BUN 69*  < > 58*  < > 46* 37* 34*  34* 32* 31* 25*  CREATININE 3.15*  < > 2.65*  < > 2.13* 1.98* 2.04*  2.00* 2.12* 2.05* 1.99*  CALCIUM 7.8*  < > 7.8*  < > 7.8* 7.3* 7.7*  7.7* 7.7* 7.5* 7.4*  MG 1.8  --  1.7  --  1.6*  --  1.7  --  1.4*  --   PHOS 4.6  < > 4.7*  4.7*  < > 4.4 3.9 4.4 4.2 3.7 3.3  < > = values in this interval not displayed.  Liver Function Tests:  Recent Labs Lab 08/21/16 1912 08/22/16 0405 08/22/16 1610 08/23/16 0431 08/23/16 1822  ALBUMIN 1.3* 1.4* 1.4* 1.4* 1.4*   No results for input(s): LIPASE, AMYLASE in the  last 168 hours. Coagulation Profile:  Recent Labs Lab 08/19/16 0416 08/20/16 0513 08/21/16 0457 08/22/16 0405 08/23/16 0431  INR 1.38 1.34 1.42 1.56 1.43   CBG:  Recent Labs Lab 08/22/16 0329 08/22/16 0750 08/22/16 1148 08/22/16 1612 08/22/16 2017  GLUCAP 103* 106* 115* 110* 131*   Urine analysis:    Component Value Date/Time   COLORURINE RED (A) 08/22/2016 1015   APPEARANCEUR CLOUDY (A) 08/22/2016 1015   LABSPEC 1.009 08/22/2016 1015   PHURINE 6.0 08/22/2016 1015   GLUCOSEU (A) 08/22/2016 1015    TEST NOT REPORTED DUE TO COLOR INTERFERENCE OF URINE PIGMENT   HGBUR (A) 08/22/2016 1015    TEST NOT REPORTED DUE TO COLOR INTERFERENCE OF URINE PIGMENT   BILIRUBINUR (A) 08/22/2016 1015    TEST NOT REPORTED DUE TO COLOR INTERFERENCE OF URINE PIGMENT   KETONESUR (A) 08/22/2016 1015    TEST NOT REPORTED DUE TO COLOR INTERFERENCE OF URINE PIGMENT   PROTEINUR (A) 08/22/2016 1015    TEST NOT REPORTED DUE TO COLOR INTERFERENCE OF  URINE PIGMENT   NITRITE (A) 08/22/2016 1015    TEST NOT REPORTED DUE TO COLOR INTERFERENCE OF URINE PIGMENT   LEUKOCYTESUR (A) 08/22/2016 1015    TEST NOT REPORTED DUE TO COLOR INTERFERENCE OF URINE PIGMENT   Radiology Studies: Dg Chest Port 1 View  Result Date: 08/23/2016 CLINICAL DATA:  08/22/2016. EXAM: PORTABLE CHEST 1 VIEW COMPARISON:  Chest x-ray 16 2018 . FINDINGS: Right PICC line right chest tube in stable position. Heart size normal. Mild subsegmental atelectasis and or infiltrate right mid lung. No pleural effusion or pneumothorax. IMPRESSION: Right PICC line right chest tube in stable position. No pneumothorax . Mild subsegmental atelectasis right mid lung. Electronically Signed   By: Marcello Moores  Register   On: 08/23/2016 09:52   Dg Chest Port 1 View  Result Date: 08/22/2016 CLINICAL DATA:  Respiratory failure EXAM: PORTABLE CHEST 1 VIEW COMPARISON:  08/21/2016 FINDINGS: Cardiac shadow is stable. Right-sided PICC line is noted in satisfactory position. Right chest tube is noted as well. No pneumothorax is seen. Mild vascular congestion is again noted. Stable right basilar atelectasis is noted. No bony abnormality is seen. IMPRESSION: Stable appearance of the chest when compare with the previous day. Electronically Signed   By: Inez Catalina M.D.   On: 08/22/2016 11:17      Scheduled Meds: . Chlorhexidine Gluconate Cloth  6 each Topical Daily  . feeding supplement (ENSURE ENLIVE)  237 mL Oral BID BM  . folic acid  1 mg Oral Daily  . Gerhardt's butt cream   Topical Daily  . hydrALAZINE  50 mg Oral Q8H  . lactulose  30 g Oral Daily  . mouth rinse  15 mL Mouth Rinse q12n4p  . multivitamin  15 mL Oral Daily  . pantoprazole  40 mg Oral Daily  . rifaximin  550 mg Oral BID  . risperiDONE  0.5 mg Oral BID  . sodium chloride flush  10-40 mL Intracatheter Q12H  . sodium chloride flush  3 mL Intravenous Q12H  . spironolactone  25 mg Oral Daily  . thiamine  100 mg Oral Daily   Continuous  Infusions: . cefTAZidime (FORTAZ)  IV    . dextrose    . dextrose 75 mL/hr at 08/24/16 0303     LOS: 36 days   Time spent: 25 minutes  Faye Ramsay, MD  [facility] Pager [phone]  If 7PM-7AM, please contact night-coverage [email or web address] Password [facility] 08/24/2016, 10:46 AM

## 2016-08-24 NOTE — Progress Notes (Signed)
[facility] Team note:  RD again following up on status on pending tube placement. Spoke with RN who says pt continues to refuse care. Was unware of consult for tube   RD attempted to reach MD regarding NGT, but unable to reach  Highly unlikely would consent for tube at this time. Will d/c consult. If pt becomes appropriate, please re-consult team.   Christophe Louis RD, LDN, CNSC Clinical Nutrition Pager: [phone] 08/24/2016 2:27 PM

## 2016-08-25 LAB — BASIC METABOLIC PANEL
Anion gap: 7 (ref 5–15)
Anion gap: 7 (ref 5–15)
BUN: 19 mg/dL (ref 6–20)
BUN: 17 mg/dL (ref 6–20)
CALCIUM: 7.4 mg/dL — AB (ref 8.9–10.3)
CHLORIDE: 103 mmol/L (ref 101–111)
CO2: 25 mmol/L (ref 22–32)
CO2: 24 mmol/L (ref 22–32)
CREATININE: 1.65 mg/dL — AB (ref 0.61–1.24)
Calcium: 7.2 mg/dL — ABNORMAL LOW (ref 8.9–10.3)
Chloride: 104 mmol/L (ref 101–111)
Creatinine, Ser: 1.76 mg/dL — ABNORMAL HIGH (ref 0.61–1.24)
GFR calc Af Amer: 52 mL/min — ABNORMAL LOW (ref >60)
GFR calc non Af Amer: 49 mL/min — ABNORMAL LOW (ref >60)
GFR, EST AFRICAN AMERICAN: 56 mL/min — AB (ref >60)
GFR, EST NON AFRICAN AMERICAN: 45 mL/min — AB (ref >60)
GLUCOSE: 100 mg/dL — AB (ref 65–99)
Glucose, Bld: 87 mg/dL (ref 65–99)
POTASSIUM: 3 mmol/L — AB (ref 3.5–5.1)
Potassium: 3.3 mmol/L — ABNORMAL LOW (ref 3.5–5.1)
SODIUM: 135 mmol/L (ref 135–145)
Sodium: 135 mmol/L (ref 135–145)

## 2016-08-25 LAB — CBC WITH DIFFERENTIAL/PLATELET
Basophils Absolute: 0 10*3/uL (ref 0.0–0.1)
Basophils Relative: 0 %
EOS PCT: 4 %
Eosinophils Absolute: 0.4 10*3/uL (ref 0.0–0.7)
HEMATOCRIT: 25.3 % — AB (ref 39.0–52.0)
Hemoglobin: 7.9 g/dL — ABNORMAL LOW (ref 13.0–17.0)
LYMPHS ABS: 2 10*3/uL (ref 0.7–4.0)
LYMPHS PCT: 24 %
MCH: 27.6 pg (ref 26.0–34.0)
MCHC: 31.2 g/dL (ref 30.0–36.0)
MCV: 88.5 fL (ref 78.0–100.0)
MONO ABS: 0.4 10*3/uL (ref 0.1–1.0)
MONOS PCT: 5 %
NEUTROS ABS: 5.5 10*3/uL (ref 1.7–7.7)
Neutrophils Relative %: 66 %
PLATELETS: 105 10*3/uL — AB (ref 150–400)
RBC: 2.86 MIL/uL — ABNORMAL LOW (ref 4.22–5.81)
RDW: 19.2 % — AB (ref 11.5–15.5)
WBC: 8.3 10*3/uL (ref 4.0–10.5)

## 2016-08-25 LAB — RENAL FUNCTION PANEL
ALBUMIN: 1.4 g/dL — AB (ref 3.5–5.0)
ALBUMIN: 1.4 g/dL — AB (ref 3.5–5.0)
Anion gap: 6 (ref 5–15)
Anion gap: 6 (ref 5–15)
BUN: 19 mg/dL (ref 6–20)
BUN: 17 mg/dL (ref 6–20)
CALCIUM: 7.3 mg/dL — AB (ref 8.9–10.3)
CO2: 25 mmol/L (ref 22–32)
CO2: 25 mmol/L (ref 22–32)
CREATININE: 1.63 mg/dL — AB (ref 0.61–1.24)
Calcium: 7.3 mg/dL — ABNORMAL LOW (ref 8.9–10.3)
Chloride: 105 mmol/L (ref 101–111)
Chloride: 105 mmol/L (ref 101–111)
Creatinine, Ser: 1.76 mg/dL — ABNORMAL HIGH (ref 0.61–1.24)
GFR calc Af Amer: 52 mL/min — ABNORMAL LOW (ref >60)
GFR calc non Af Amer: 45 mL/min — ABNORMAL LOW (ref >60)
GFR, EST AFRICAN AMERICAN: 57 mL/min — AB (ref >60)
GFR, EST NON AFRICAN AMERICAN: 49 mL/min — AB (ref >60)
GLUCOSE: 104 mg/dL — AB (ref 65–99)
Glucose, Bld: 89 mg/dL (ref 65–99)
PHOSPHORUS: 3.3 mg/dL (ref 2.5–4.6)
POTASSIUM: 3 mmol/L — AB (ref 3.5–5.1)
Phosphorus: 3.3 mg/dL (ref 2.5–4.6)
Potassium: 3.3 mmol/L — ABNORMAL LOW (ref 3.5–5.1)
SODIUM: 136 mmol/L (ref 135–145)
Sodium: 136 mmol/L (ref 135–145)

## 2016-08-25 LAB — PROTIME-INR
INR: 1.26
Prothrombin Time: 15.9 seconds — ABNORMAL HIGH (ref 11.4–15.2)

## 2016-08-25 LAB — APTT: aPTT: 65 seconds — ABNORMAL HIGH (ref 24–36)

## 2016-08-25 LAB — MAGNESIUM: MAGNESIUM: 1.4 mg/dL — AB (ref 1.7–2.4)

## 2016-08-25 MED ORDER — MAGNESIUM SULFATE 2 GM/50ML IV SOLN
2.0000 g | Freq: Once | INTRAVENOUS | Status: AC
  Administered 2016-08-25: 2 g via INTRAVENOUS
  Filled 2016-08-25: qty 50

## 2016-08-25 MED ORDER — POTASSIUM CHLORIDE IN NACL 40-0.9 MEQ/L-% IV SOLN
INTRAVENOUS | Status: DC
  Administered 2016-08-25 – 2016-08-26 (×2): 75 mL/h via INTRAVENOUS
  Administered 2016-08-26: 50 mL/h via INTRAVENOUS
  Filled 2016-08-25 (×4): qty 1000

## 2016-08-25 NOTE — Progress Notes (Signed)
Dr. Izola Price paged and advised about patient refusing morning lactulose. Education was given on importance of medication but he still refused.

## 2016-08-25 NOTE — Progress Notes (Signed)
PROGRESS NOTE  Johnathan [name]  RCV:[id number] DOB: [birth year] DOA: 07/19/2016   PCP: [name], MD   Brief Narrative:  46 yo Male PMHx Hep C and IVDA, Polysubstance Abuse, presented with one week duration of fevers, redness and swelling on the dorsum of the right foot. Pt with ongoing use of opioids, injected Opana 2 days PTA. Pt admitted with right foot cellulitis as well as RLL infiltrate, found to have MSSA bacteremia with possible Meningitis. On 7/17, pt developed acute encephalopathy with agitation, combative requiring precedex drip.   Subjective: More calm this AM, able to follow commands and answer questions. Says he wants to eat regular diet. He is aware of refusing care at times and says he is tired of all of this and this is the reason why he is refusing.   Assessment & Plan:   RLL cavitary PNA /- septic emboli v/s aspiration - Continue current antibiotic regimen: Will require long-term IV antibiotics.  - resp status stable    Hemoptysis / Diffuse alveolar hemorrhage - due to cavitary PNA - no further hemoptysis    MSSA and Pseudomonas bacteremia w/ sepsis/Tricuspid valve Endocarditis  - CSF culture negative - Will require 6-8 weeks antibiotics. - Patient will require LTAC or SNF secondary to IV drug abuse patient cannot go home with long-term access - ABX per ID team   Hepatitis C - liver cirrhosis  - Patient currently not candidate for treatment secondary to drug abuse - outpatient follow up    LUE Radial Vein DVT  - Seen on duplex 7/24. No PE on CTA 7/24  Acute Encephalopathy - more alert and calm this AM - oriented to name and date, place    Acute Renal Failure  - Cr  3.17 on admission: Secondary to dehydration.  - Cr overall trending down   Nausea w/ Bilious Emesis - ? Duodenitis - resolved - pt wants to eat regular diet and per staff report so far tolerating well   Hypokalemia - with low Mg, still neds supplementing but has been refusing     Hypernatremia - has resolved   Drug abuse and dependence  - pt does not want to discuss this today   Protein calorie malnutrition - nutritionist consulted, appreciate recommendations   Thrombocytopenia, anemia - overall improving - CBC In AM  DVT prophylaxis: SCD Code Status: Full Family Communication: no family at bedside Disposition Plan: Requires LTAC or SNF  Consultants:  [facility] ID   Procedures/Significant Events:  7/13 - Admit to [facility] 7/17 - Transfer to ICU for precedex infusion 7/21 - Precedex off  7/22 - TRH resumed care 7/24 - venous duplex L UE radial vein DVT 7/25 - PCCM signed off 8/02 - Patient hypothermic & more agitated 8/03 - acute tachypnea w/ "guppy breathing" > emergently intubated w/ bronchoscopy showing "mild diffuse alveolar hemorrhage" 8/05 - Right chest tube inserted w/ 350 mL serous fluid. Bicarb gtt started for acidosis 8/06 - Switched off Cefepime to [place] given potential for worsening encephalopathy  8/08 - Albumin w/ Lasix today. Improving UOP.  8/09 - Albumin w/ Lasix q6hr x3 doses. Precedex started in an attempt to wean Fentanyl drip.  8/11 - N/V of bilious emesis overnight & continued diuresis 8/12 - Continued diuresis w/ Lasix & added Aldactone daily. Patient apneic during SBT. Decreasing chest tube output. 8/13 - extubated   RENAL U/S 7/13:   IMPRESSION: 1. Mild right pelvicaliectasis, with ureteral jet noted in the urinary bladder excluding significant ureteral obstruction. 2.  Small left renal cyst. 3. Pleural effusions and abdominal ascites. LP 7/13:  Tube 1 - WBC 51 (62% lymph, 29% lymph, 9% mono), RBC 555, Protein 88 , & Glucose 52. / Tube 4 - WBC 180 (58% neutro, 31% lymph, 11% monocytes) & RBC 36. TTE 7/14:  LV normal in size with EF 55-60%. No regional wall motion abnormalities & grade 1 diastolic dysfunction. LA upper limits of normal in size & RA normal in size. RV normal in size and function. No aortic stenosis or  regurgitation. Aortic root normal in size. No mitral stenosis or regurgitation. No pulmonic stenosis or regurgitation with poorly visualized valve. No tricuspid regurgitation. Medium sized mobile vegetation noted on tricuspid valve. No pericardial effusion. CT HEAD W/O 7/16:  Progressive atrophy since 2011.  No acute intracranial findings. No abnormal enhancement or vasogenic edema to suggest septic emboli to the brain. VENOUS DUPLEX BILATERAL LOWER EXTREMITY 7/17:  No DVT or SVT.  CTA CHEST 7/24:  Negative for a pulmonary embolism. Extensive pleural-parenchymal lung disease bilaterally. Large right pleural effusion and moderate-sized left pleural effusion. Pleural-based nodular opacities could represent an infectious etiology but also raise concern for a neoplastic process. Concern for a cavitary lesion and necrotic tissue in the right lung. Consider sampling of the pleural fluid. Recommend follow-up CT when the pleural fluid and acute process have resolved to exclude neoplastic disease. Severe emphysematous disease. Cirrhosis with evidence for portal hypertension based on the splenomegaly and ascites. 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  VENOUS DUPLEX LUE 7/24:  Acute DVT involving small portion of left radial vein in mid-forearm.  CT HEAD W/O 7/26:  Normal head CT. Right Pleural Effusion 7/26:  1.5 L hazy yellow fluid by IR. Protein <3.0, Albumin <1.0, Amylase 39, Glucose 72, LDH 72, & WBC 2431 (11% lymph, 82% neutro, & 7% mono). Cytology negative for malignancy.  MRI BRAIN W/O 8/2:  Exam is limited to diffusion only. Negative for acute infarct. No area of restricted diffusion. EEG 8/2:  This EEG is abnormal due to moderate diffuse slowing of the background. Lingula BAL 8/3:  WBC 78 (39% lymph, 31% neutro, 30% mono). Reported DAH.  TTE 8/3:  LV normal in size with EF 55-60%. No regional wall motion abnormality & grade 1 diastolic dysfunction. LA & RA normal in size. RV  normal in size and function. No aortic stenosis or regurgitation. Aortic root normal in size. No mitral stenosis or regurgitation. No pulmonic stenosis. Trivial regurgitation. Trivial pericardial effusion as well. Notably no vegetations were seen on this exam. Right Pleural Effusion 8/5:  WBC 616 (1% lymph, 1% eos, & 98% neutro). PORT CXR 8/5:  Previously reviewed by me. Right-sided predominant opacities. Right-sided chest tube in place. Endotracheal tube in good position. Enteric feeding tube in good position. Right upper extremity PICC line in good position. Complete Abdominal U/S 8/6: Stable cirrhotic changes involving the liver without definite focal hepatic lesion. Mild common bile duct dilatation without obvious common bile duct stone or intrahepatic biliary dilatation. Poor visualization of the pancreas. Increased echogenicity of both kidneys suggesting medical renal disease. No hydronephrosis. Large volume ascites and bilateral pleural effusions. Port CXR 8/7:  Previously reviewed by me. Hazy opacities bilateral lower lung zones. Endotracheal tube & enteric feeding tube in good position. Right chest tube in good position.  PORT CXR 8/11:  Personally reviewed by me. Patchy hazy opacities bilaterally which are improving. No appreciable residual pleural effusion on the right. Right-sided chest tube in  good position. Endotracheal tube and right upper extremity central venous catheter in good position. Enteric feeding tube coursing below diaphragm. PORT ABD X-RAY 8/11: Paucity of bowel gas without evidence of enteric obstruction. Enteric tube tip and side port project over the expected location of the mid body of the stomach. CT abd / pelv 8/12 > persistent bilateral pleural effusion (though improved), 3cm irregular rounded density in RLL.  Cirrhosis with splenomegaly and ascites, anasarca. Inflammatory vs infectious process involving duodenum  Cultures Blood Cultures x2 7/12:  2/2 Bottles MSSA & 1/2  Bottles Pseudomonas aeruginosa  CSF Culture 7/13:  Negative  MRSA PCR 7/13:  Negative HIV 7/13:  Negative Respiratory Viral Panel PCR 7/13:  Negative  Urine Culture 7/13:  Multiple Species Present Blood Cultures x2 7/14:  1/2 Bottles MSSA Blood Culture x1 7/15:  Negative  MRSA PCR 7/17:  Negative  Urine Culture 7/17:  Negative  Urine Streptococcal Antigen 7/17:  Negative Urine Legionella Antigen 7/17:  Negative  Right Pleural Fluid Culture 7/26 >>> Bacteria Negative / AFB pending / Fungus pending Lingula BAL 8/3:  Candida tropicalis  Blood Cultures x2 8/3:  Negative  Right Pleural Culture 8/5:  Negative  Blood Culture x2 8/6:  Negative   Antimicrobials: Anti-infectives    Start     Stop   08/22/16 1100  rifaximin (XIFAXAN) tablet 550 mg         08/19/16 1000  cefTAZidime (FORTAZ) 1 g in dextrose 5 % 50 mL IVPB     08/31/16 2359   08/12/16 1100  cefTAZidime (FORTAZ) 2 g in dextrose 5 % 50 mL IVPB  Status:  Discontinued     08/19/16 0555   08/11/16 1000  ceFEPIme (MAXIPIME) 1 g in dextrose 5 % 50 mL IVPB  Status:  Discontinued     08/12/16 0936   08/10/16 1500  vancomycin (VANCOCIN) 1,250 mg in sodium chloride 0.9 % 250 mL IVPB  Status:  Discontinued     08/11/16 0930   08/10/16 1000  ceFEPIme (MAXIPIME) 2 g in dextrose 5 % 50 mL IVPB  Status:  Discontinued     08/11/16 0930   08/09/16 1300  vancomycin (VANCOCIN) 1,500 mg in sodium chloride 0.9 % 500 mL IVPB     08/09/16 1602   07/22/16 1400  ceFEPIme (MAXIPIME) 2 g in dextrose 5 % 50 mL IVPB  Status:  Discontinued     08/09/16 1226   07/20/16 1500  ceFEPIme (MAXIPIME) 2 g in dextrose 5 % 50 mL IVPB  Status:  Discontinued     07/22/16 1122   07/19/16 2300  vancomycin (VANCOCIN) IVPB 750 mg/150 ml premix  Status:  Discontinued     07/19/16 0953   07/19/16 2200  ceFEPIme (MAXIPIME) 2 g in dextrose 5 % 50 mL IVPB  Status:  Discontinued     07/20/16 1410   07/19/16 2000  nafcillin 2 g in dextrose 5 % 100 mL IVPB  Status:   Discontinued     07/22/16 1130   07/19/16 1915  nafcillin injection 2 g  Status:  Discontinued     07/19/16 1915   07/19/16 1800  ceFEPIme (MAXIPIME) 2 g in dextrose 5 % 50 mL IVPB  Status:  Discontinued     07/19/16 1912   07/19/16 1100  vancomycin (VANCOCIN) 500 mg in sodium chloride 0.9 % 100 mL IVPB  Status:  Discontinued     07/19/16 1645   07/19/16 0800  piperacillin-tazobactam (ZOSYN) IVPB 3.375 g  Status:  Discontinued     07/19/16 1645   07/19/16 0200  cefTRIAXone (ROCEPHIN) 2 g in dextrose 5 % 50 mL IVPB     07/19/16 0259   07/19/16 0130  cefTRIAXone (ROCEPHIN) 1 g in dextrose 5 % 50 mL IVPB  Status:  Discontinued     07/19/16 0140   07/19/16 0115  piperacillin-tazobactam (ZOSYN) IVPB 3.375 g     07/19/16 0219   07/19/16 0115  vancomycin (VANCOCIN) IVPB 1000 mg/200 mL premix     07/19/16 0243     LINES / TUBES:  OETT 8/3 >>>8/13 Rt CHEST TUBE 8/5 >>> RUE DL PICC 7/27 >>> Foley >>>  Continuous Infusions: . cefTAZidime (FORTAZ)  IV Stopped (08/25/16 1100)  . dextrose    . dextrose 75 mL/hr at 08/25/16 0608   Objective: Vitals:   08/24/16 1314 08/24/16 1839 08/24/16 2219 08/25/16 0500  BP: (!) 154/81 (!) 146/90 (!) 154/89 (!) 181/74  Pulse: 60  74 66  Resp: 20  18   Temp: 98 F (36.7 C)  98.4 F (36.9 C) (!) 97.2 F (36.2 C)  TempSrc: Oral  Oral Oral  SpO2: 100%  100% 99%  Weight:    73.4 kg (161 lb 13.1 oz)  Height:        Intake/Output Summary (Last 24 hours) at 08/25/16 1225 Last data filed at 08/25/16 0867  Gross per 24 hour  Intake             2071 ml  Output              650 ml  Net             1421 ml   Filed Weights   08/22/16 2321 08/24/16 0500 08/25/16 0500  Weight: 72.9 kg (160 lb 11.5 oz) 73.1 kg (161 lb 2.5 oz) 73.4 kg (161 lb 13.1 oz)   Physical Exam  Constitutional: Appears calm, NAD CVS: RRR, S1/S2 +, no gallops, no carotid bruit.  Pulmonary: Effort and breath sounds normal, diminished breath sounds on the right  Abdominal: Soft. BS  +,  no distension, tenderness, rebound or guarding.   Data Reviewed: I have reviewed pt's blood work and VS.   CBC:  Recent Labs Lab 08/20/16 0513 08/21/16 0457 08/22/16 0405 08/23/16 0431 08/25/16 0355  WBC 8.3 12.7* 15.9* 9.7 8.3  NEUTROABS 5.2 10.1* 12.7* 6.8 5.5  HGB 8.4* 8.3* 7.9* 7.6* 7.9*  HCT 26.6* 26.5* 26.0* 24.8* 25.3*  MCV 88.7 91.1 90.9 91.2 88.5  PLT 90* 96* 88* 85* 619*   Basic Metabolic Panel:  Recent Labs Lab 08/21/16 0457  08/22/16 0405 08/22/16 1610 08/23/16 0431 08/23/16 1822 08/24/16 1030 08/25/16 0355  NA 152*  < > 153*  154* 149* 145 142 139 135  136  K 3.3*  < > 3.4*  3.4* 3.3* 3.3* 3.1* 3.2* 3.0*  3.0*  CL 115*  < > 116*  116* 113* 111 108 106 103  105  CO2 28  < > _0 GLUCOSE 97  < > 111*  111* 129* 126* 114* 98 100*  104*  BUN 46*  < > 34*  34* 32* 31* 25* 23* 19  19  CREATININE 2.13*  < > 2.04*  2.00* 2.12* 2.05* 1.99* 1.88* 1.76*  1.76*  CALCIUM 7.8*  < > 7.7*  7.7* 7.7* 7.5* 7.4* 7.3* 7.2*  7.3*  MG 1.6*  --  1.7  --  1.4*  --  1.7 1.4*  PHOS 4.4  < > 4.4 4.2 3.7 3.3 3.3 3.3  < > = values in this interval not displayed.  Liver Function Tests:  Recent Labs Lab 08/22/16 1610 08/23/16 0431 08/23/16 1822 08/24/16 1030 08/25/16 0355  ALBUMIN 1.4* 1.4* 1.4* 1.4* 1.4*   Coagulation Profile:  Recent Labs Lab 08/20/16 0513 08/21/16 0457 08/22/16 0405 08/23/16 0431 08/25/16 0355  INR 1.34 1.42 1.56 1.43 1.26   CBG:  Recent Labs Lab 08/22/16 0329 08/22/16 0750 08/22/16 1148 08/22/16 1612 08/22/16 2017  GLUCAP 103* 106* 115* 110* 131*   Urine analysis:    Component Value Date/Time   COLORURINE RED (A) 08/22/2016 1015   APPEARANCEUR CLOUDY (A) 08/22/2016 1015   LABSPEC 1.009 08/22/2016 1015   PHURINE 6.0 08/22/2016 1015   GLUCOSEU (A) 08/22/2016 1015    TEST NOT REPORTED DUE TO COLOR INTERFERENCE OF URINE PIGMENT   HGBUR (A) 08/22/2016 1015    TEST NOT REPORTED DUE TO COLOR  INTERFERENCE OF URINE PIGMENT   BILIRUBINUR (A) 08/22/2016 1015    TEST NOT REPORTED DUE TO COLOR INTERFERENCE OF URINE PIGMENT   KETONESUR (A) 08/22/2016 1015    TEST NOT REPORTED DUE TO COLOR INTERFERENCE OF URINE PIGMENT   PROTEINUR (A) 08/22/2016 1015    TEST NOT REPORTED DUE TO COLOR INTERFERENCE OF URINE PIGMENT   NITRITE (A) 08/22/2016 1015    TEST NOT REPORTED DUE TO COLOR INTERFERENCE OF URINE PIGMENT   LEUKOCYTESUR (A) 08/22/2016 1015    TEST NOT REPORTED DUE TO COLOR INTERFERENCE OF URINE PIGMENT   Radiology Studies: No results found.    Scheduled Meds: . Chlorhexidine Gluconate Cloth  6 each Topical Daily  . feeding supplement (ENSURE ENLIVE)  237 mL Oral BID BM  . folic acid  1 mg Oral Daily  . Gerhardt's butt cream   Topical Daily  . hydrALAZINE  50 mg Oral Q8H  . lactulose  30 g Oral Daily  . mouth rinse  15 mL Mouth Rinse q12n4p  . multivitamin  15 mL Oral Daily  . pantoprazole  40 mg Oral Daily  . rifaximin  550 mg Oral BID  . risperiDONE  0.5 mg Oral BID  . sodium chloride flush  10-40 mL Intracatheter Q12H  . sodium chloride flush  3 mL Intravenous Q12H  . spironolactone  25 mg Oral Daily  . thiamine  100 mg Oral Daily   Continuous Infusions: . cefTAZidime (FORTAZ)  IV Stopped (08/25/16 1100)  . dextrose    . dextrose 75 mL/hr at 08/25/16 0608     LOS: 37 days   Time spent: 25 minutes  Faye Ramsay, MD  [facility] Pager [phone]  If 7PM-7AM, please contact night-coverage [email or web address] Password [facility] 08/25/2016, 12:25 PM

## 2016-08-25 NOTE — Progress Notes (Signed)
Chest tube canister changed at this time.

## 2016-08-25 NOTE — Progress Notes (Signed)
Pt calm and cooperative at this time. Oriented with person and place. Tele-monitoring ordered. Oriented pt of tele-monitoring, oriented pt to use call light for assistance. Bed alarm on. Will continue to monitor pt.

## 2016-08-26 LAB — RENAL FUNCTION PANEL
Albumin: 1.4 g/dL — ABNORMAL LOW (ref 3.5–5.0)
Albumin: 1.4 g/dL — ABNORMAL LOW (ref 3.5–5.0)
Anion gap: 5 (ref 5–15)
Anion gap: 5 (ref 5–15)
BUN: 14 mg/dL (ref 6–20)
BUN: 13 mg/dL (ref 6–20)
CHLORIDE: 109 mmol/L (ref 101–111)
CO2: 25 mmol/L (ref 22–32)
CO2: 24 mmol/L (ref 22–32)
CREATININE: 1.54 mg/dL — AB (ref 0.61–1.24)
Calcium: 7.3 mg/dL — ABNORMAL LOW (ref 8.9–10.3)
Calcium: 7.5 mg/dL — ABNORMAL LOW (ref 8.9–10.3)
Chloride: 108 mmol/L (ref 101–111)
Creatinine, Ser: 1.51 mg/dL — ABNORMAL HIGH (ref 0.61–1.24)
GFR calc Af Amer: >60 mL/min (ref >60)
GFR calc non Af Amer: 53 mL/min — ABNORMAL LOW (ref >60)
GFR, EST NON AFRICAN AMERICAN: 54 mL/min — AB (ref >60)
GLUCOSE: 81 mg/dL (ref 65–99)
Glucose, Bld: 80 mg/dL (ref 65–99)
PHOSPHORUS: 3.5 mg/dL (ref 2.5–4.6)
POTASSIUM: 3.6 mmol/L (ref 3.5–5.1)
POTASSIUM: 4.1 mmol/L (ref 3.5–5.1)
Phosphorus: 3.3 mg/dL (ref 2.5–4.6)
Sodium: 138 mmol/L (ref 135–145)
Sodium: 138 mmol/L (ref 135–145)

## 2016-08-26 LAB — CBC WITH DIFFERENTIAL/PLATELET
BASOS ABS: 0 10*3/uL (ref 0.0–0.1)
BASOS PCT: 1 %
EOS ABS: 0.3 10*3/uL (ref 0.0–0.7)
EOS PCT: 4 %
HCT: 25.2 % — ABNORMAL LOW (ref 39.0–52.0)
Hemoglobin: 8.1 g/dL — ABNORMAL LOW (ref 13.0–17.0)
LYMPHS ABS: 2 10*3/uL (ref 0.7–4.0)
Lymphocytes Relative: 28 %
MCH: 28.5 pg (ref 26.0–34.0)
MCHC: 32.1 g/dL (ref 30.0–36.0)
MCV: 88.7 fL (ref 78.0–100.0)
MONO ABS: 0.4 10*3/uL (ref 0.1–1.0)
MONOS PCT: 6 %
NEUTROS ABS: 4.5 10*3/uL (ref 1.7–7.7)
NEUTROS PCT: 62 %
Platelets: 117 10*3/uL — ABNORMAL LOW (ref 150–400)
RBC: 2.84 MIL/uL — AB (ref 4.22–5.81)
RDW: 18.9 % — ABNORMAL HIGH (ref 11.5–15.5)
WBC: 7.2 10*3/uL (ref 4.0–10.5)

## 2016-08-26 LAB — APTT: APTT: 84 s — AB (ref 24–36)

## 2016-08-26 LAB — PROTIME-INR
INR: 1.27
PROTHROMBIN TIME: 16 s — AB (ref 11.4–15.2)

## 2016-08-26 LAB — MAGNESIUM: Magnesium: 1.8 mg/dL (ref 1.7–2.4)

## 2016-08-26 MED ORDER — DEXTROSE 5 % IV SOLN
2.0000 g | Freq: Three times a day (TID) | INTRAVENOUS | Status: AC
  Administered 2016-08-26 – 2016-08-31 (×16): 2 g via INTRAVENOUS
  Filled 2016-08-26 (×16): qty 2

## 2016-08-26 NOTE — Care Management Note (Signed)
Case Management Note  Patient Details  Name: Finis Howard MRN: [id number] Date of Birth: October 03, 1970  Subjective/Objective:                    Action/Plan:  Referral for LTAC. Patient uninsured therefore unable to go to LTAC, and also to receive HHPT.  Patient's ABX will be completed on 08/31/16. Patient ambulated 125 feet.    Will continue to follow. SW also following. Expected Discharge Date:                  Expected Discharge Plan:  Home/Self Care  In-House Referral:  NA  Discharge planning Services  CM Consult  Post Acute Care Choice:  NA Choice offered to:  NA  DME Arranged:  N/A DME Agency:  NA  HH Arranged:  NA HH Agency:  NA  Status of Service:  In process, will continue to follow  If discussed at Long Length of Stay Meetings, dates discussed:    Additional Comments:  Kingsley Plan, RN 08/26/2016, 10:33 AM

## 2016-08-26 NOTE — Progress Notes (Signed)
  Speech Language Pathology Treatment: Dysphagia  Patient Details Name: Johnathan [name] MRN: [id number] DOB: Sep 15, 1970 Today's Date: 08/26/2016 Time: 7564-3329 SLP Time Calculation (min) (ACUTE ONLY): 9 min  Assessment / Plan / Recommendation Clinical Impression  Per chart review, see that MD advanced diet to regular textures on 8/18 per pt wishes. Cognitively he is much more appropriate to handle these solid textures as compared to initial evaluation, although he does still have mildly prolonged mastication that may be near his baseline given his dentition. He has immediate coughing post-swallow with thin liquids on ~50% of trials. With SLP intervention for straw removal, no further coughing was observed. SLP provided education about recommendations to continue current diet but without use of straws in order to reduce aspiration risk. Pt verbalized his understanding. Will continue to follow briefly.    HPI HPI: Ptis a 46 y.o.male admitted with sepsis secondary to UTI vs cellulitis and ARF and hyponatremia. PMH:w Hep C?, IVDA, (recently injected opana), c/o fever intermittently and redness over the dorsum of the right foot and slight swelling. Per chart pt notes injecting Opana about 2 days prior to admit. Pt seen for BSE 7/13 without indications of oropharyngeal dysphagia; shallow respiratory pattern; regular diet/thin recommended and signed off. Transferred to ICU for Precedex due to ETOH withdrawal 7/17. Developed right cavitary PNA from septic emboli with right pleural effusion. Pt was reassessed 7/28 with recommendations to remain NPO due to mentation. He became somewhat more alert and was on a pureed diet and thin liquids until he needed to be intubated 8/3-8/13. SLP was asked to reassess s/p extubation.      SLP Plan  Continue with current plan of care       Recommendations  Diet recommendations: Regular;Thin liquid Liquids provided via: Cup;No straw Medication  Administration: Whole meds with liquid Supervision: Patient able to self feed;Intermittent supervision to cue for compensatory strategies Compensations: Slow rate;Small sips/bites;Follow solids with liquid Postural Changes and/or Swallow Maneuvers: Seated upright 90 degrees                Oral Care Recommendations: Oral care BID Follow up Recommendations: 24 hour supervision/assistance SLP Visit Diagnosis: Dysphagia, unspecified (R13.10) Plan: Continue with current plan of care       GO                Maxcine Ham 08/26/2016, 10:02 AM  Maxcine Ham, M.A. CCC-SLP [phone]

## 2016-08-26 NOTE — Progress Notes (Signed)
PROGRESS NOTE  Johnathan [name]  IRS:[id number] DOB: 11-26-1970 DOA: 07/19/2016   PCP: [name], MD   Brief Narrative:  46 yo Male PMHx Hep C and IVDA, Polysubstance Abuse, presented with one week duration of fevers, redness and swelling on the dorsum of the right foot. Pt with ongoing use of opioids, injected Opana 2 days PTA. Pt admitted with right foot cellulitis as well as RLL infiltrate, found to have MSSA bacteremia with possible Meningitis. On 7/17, pt developed acute encephalopathy with agitation, combative requiring precedex drip.   Subjective: More calm this AM, able to follow commands and answer questions. Says he wants to eat regular diet. He is aware of refusing care at times and says he is tired of all of this and this is the reason why he is refusing.   Assessment & Plan:   RLL cavitary PNA /- septic emboli v/s aspiration - Continue current antibiotic regimen: Will require long-term IV antibiotics.  - respiratory status stable   Hemoptysis / Diffuse alveolar hemorrhage - due to cavitary PNA - no further hemoptysis   MSSA and Pseudomonas bacteremia w/ sepsis/Tricuspid valve Endocarditis  - CSF culture negative - Patient will require LTAC or SNF secondary to IV drug abuse patient cannot go home with long-term access - ceftazidime changed to 2 g IV every 8 hours, continue through 08/31/2016 as per ID plan  Hepatitis C - liver cirrhosis  - Patient currently not candidate for treatment secondary to drug abuse - outpatient follow-up  LUE Radial Vein DVT  - Seen on duplex 7/24. No PE on CTA 7/24  Acute Encephalopathy - Remains calm this morning   Acute Renal Failure  - Cr  3.17 on admission: Secondary to dehydration.  - creatinine overall trending down  Nausea w/ Bilious Emesis - ? Duodenitis - resolved - patient eating regular diet, does not want any other types of feedings or diet   Hypokalemia - magnesium and potassium both supplemented and now  within normal limits  Hypernatremia - resolved  Drug abuse and dependence  - deferred per patient request  Protein calorie malnutrition - nutritionist consulted, appreciate recommendations   Thrombocytopenia, anemia - Improving  DVT prophylaxis: SCD Code Status: Full Family Communication: no family at bedside Disposition Plan: Requires LTAC or SNF  Consultants:  [facility] M ID   Procedures/Significant Events:  7/13 - Admit to [facility] 7/17 - Transfer to ICU for precedex infusion 7/21 - Precedex off  7/22 - TRH resumed care 7/24 - venous duplex L UE radial vein DVT 7/25 - PCCM signed off 8/02 - Patient hypothermic & more agitated 8/03 - acute tachypnea w/ "guppy breathing" > emergently intubated w/ bronchoscopy showing "mild diffuse alveolar hemorrhage" 8/05 - Right chest tube inserted w/ 350 mL serous fluid. Bicarb gtt started for acidosis 8/06 - Switched off Cefepime to [place] given potential for worsening encephalopathy  8/08 - Albumin w/ Lasix today. Improving UOP.  8/09 - Albumin w/ Lasix q6hr x3 doses. Precedex started in an attempt to wean Fentanyl drip.  8/11 - N/V of bilious emesis overnight & continued diuresis 8/12 - Continued diuresis w/ Lasix & added Aldactone daily. Patient apneic during SBT. Decreasing chest tube output. 8/13 - extubated   RENAL U/S 7/13:   IMPRESSION: 1. Mild right pelvicaliectasis, with ureteral jet noted in the urinary bladder excluding significant ureteral obstruction. 2. Small left renal cyst. 3. Pleural effusions and abdominal ascites. LP 7/13:  Tube 1 - WBC 51 (62% lymph, 29% lymph, 9% mono), RBC  555, Protein 88 , & Glucose 52. / Tube 4 - WBC 180 (58% neutro, 31% lymph, 11% monocytes) & RBC 36. TTE 7/14:  LV normal in size with EF 55-60%. No regional wall motion abnormalities & grade 1 diastolic dysfunction. LA upper limits of normal in size & RA normal in size. RV normal in size and function. No aortic stenosis or regurgitation. Aortic root  normal in size. No mitral stenosis or regurgitation. No pulmonic stenosis or regurgitation with poorly visualized valve. No tricuspid regurgitation. Medium sized mobile vegetation noted on tricuspid valve. No pericardial effusion. CT HEAD W/O 7/16:  Progressive atrophy since 2011.  No acute intracranial findings. No abnormal enhancement or vasogenic edema to suggest septic emboli to the brain. VENOUS DUPLEX BILATERAL LOWER EXTREMITY 7/17:  No DVT or SVT.  CTA CHEST 7/24:  Negative for a pulmonary embolism. Extensive pleural-parenchymal lung disease bilaterally. Large right pleural effusion and moderate-sized left pleural effusion. Pleural-based nodular opacities could represent an infectious etiology but also raise concern for a neoplastic process. Concern for a cavitary lesion and necrotic tissue in the right lung. Consider sampling of the pleural fluid. Recommend follow-up CT when the pleural fluid and acute process have resolved to exclude neoplastic disease. Severe emphysematous disease. Cirrhosis with evidence for portal hypertension based on the splenomegaly and ascites. 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  VENOUS DUPLEX LUE 7/24:  Acute DVT involving small portion of left radial vein in mid-forearm.  CT HEAD W/O 7/26:  Normal head CT. Right Pleural Effusion 7/26:  1.5 L hazy yellow fluid by IR. Protein <3.0, Albumin <1.0, Amylase 39, Glucose 72, LDH 72, & WBC 2431 (11% lymph, 82% neutro, & 7% mono). Cytology negative for malignancy.  MRI BRAIN W/O 8/2:  Exam is limited to diffusion only. Negative for acute infarct. No area of restricted diffusion. EEG 8/2:  This EEG is abnormal due to moderate diffuse slowing of the background. Lingula BAL 8/3:  WBC 78 (39% lymph, 31% neutro, 30% mono). Reported DAH.  TTE 8/3:  LV normal in size with EF 55-60%. No regional wall motion abnormality & grade 1 diastolic dysfunction. LA & RA normal in size. RV normal in size and function. No  aortic stenosis or regurgitation. Aortic root normal in size. No mitral stenosis or regurgitation. No pulmonic stenosis. Trivial regurgitation. Trivial pericardial effusion as well. Notably no vegetations were seen on this exam. Right Pleural Effusion 8/5:  WBC 616 (1% lymph, 1% eos, & 98% neutro). PORT CXR 8/5:  Previously reviewed by me. Right-sided predominant opacities. Right-sided chest tube in place. Endotracheal tube in good position. Enteric feeding tube in good position. Right upper extremity PICC line in good position. Complete Abdominal U/S 8/6: Stable cirrhotic changes involving the liver without definite focal hepatic lesion. Mild common bile duct dilatation without obvious common bile duct stone or intrahepatic biliary dilatation. Poor visualization of the pancreas. Increased echogenicity of both kidneys suggesting medical renal disease. No hydronephrosis. Large volume ascites and bilateral pleural effusions. Port CXR 8/7:  Previously reviewed by me. Hazy opacities bilateral lower lung zones. Endotracheal tube & enteric feeding tube in good position. Right chest tube in good position.  PORT CXR 8/11:  Personally reviewed by me. Patchy hazy opacities bilaterally which are improving. No appreciable residual pleural effusion on the right. Right-sided chest tube in good position. Endotracheal tube and right upper extremity central venous catheter in good position. Enteric feeding tube coursing below diaphragm. PORT ABD X-RAY 8/11: Paucity  of bowel gas without evidence of enteric obstruction. Enteric tube tip and side port project over the expected location of the mid body of the stomach. CT abd / pelv 8/12 > persistent bilateral pleural effusion (though improved), 3cm irregular rounded density in RLL.  Cirrhosis with splenomegaly and ascites, anasarca. Inflammatory vs infectious process involving duodenum  Cultures Blood Cultures x2 7/12:  2/2 Bottles MSSA & 1/2 Bottles Pseudomonas aeruginosa    CSF Culture 7/13:  Negative  MRSA PCR 7/13:  Negative HIV 7/13:  Negative Respiratory Viral Panel PCR 7/13:  Negative  Urine Culture 7/13:  Multiple Species Present Blood Cultures x2 7/14:  1/2 Bottles MSSA Blood Culture x1 7/15:  Negative  MRSA PCR 7/17:  Negative  Urine Culture 7/17:  Negative  Urine Streptococcal Antigen 7/17:  Negative Urine Legionella Antigen 7/17:  Negative  Right Pleural Fluid Culture 7/26 >>> Bacteria Negative / AFB pending / Fungus pending Lingula BAL 8/3:  Candida tropicalis  Blood Cultures x2 8/3:  Negative  Right Pleural Culture 8/5:  Negative  Blood Culture x2 8/6:  Negative   Antimicrobials: Anti-infectives    Start     Stop   08/22/16 1100  rifaximin (XIFAXAN) tablet 550 mg         08/19/16 1000  cefTAZidime (FORTAZ) 1 g in dextrose 5 % 50 mL IVPB     08/31/16 2359   08/12/16 1100  cefTAZidime (FORTAZ) 2 g in dextrose 5 % 50 mL IVPB  Status:  Discontinued     08/19/16 0555   08/11/16 1000  ceFEPIme (MAXIPIME) 1 g in dextrose 5 % 50 mL IVPB  Status:  Discontinued     08/12/16 0936   08/10/16 1500  vancomycin (VANCOCIN) 1,250 mg in sodium chloride 0.9 % 250 mL IVPB  Status:  Discontinued     08/11/16 0930   08/10/16 1000  ceFEPIme (MAXIPIME) 2 g in dextrose 5 % 50 mL IVPB  Status:  Discontinued     08/11/16 0930   08/09/16 1300  vancomycin (VANCOCIN) 1,500 mg in sodium chloride 0.9 % 500 mL IVPB     08/09/16 1602   07/22/16 1400  ceFEPIme (MAXIPIME) 2 g in dextrose 5 % 50 mL IVPB  Status:  Discontinued     08/09/16 1226   07/20/16 1500  ceFEPIme (MAXIPIME) 2 g in dextrose 5 % 50 mL IVPB  Status:  Discontinued     07/22/16 1122   07/19/16 2300  vancomycin (VANCOCIN) IVPB 750 mg/150 ml premix  Status:  Discontinued     07/19/16 0953   07/19/16 2200  ceFEPIme (MAXIPIME) 2 g in dextrose 5 % 50 mL IVPB  Status:  Discontinued     07/20/16 1410   07/19/16 2000  nafcillin 2 g in dextrose 5 % 100 mL IVPB  Status:  Discontinued     07/22/16 1130    07/19/16 1915  nafcillin injection 2 g  Status:  Discontinued     07/19/16 1915   07/19/16 1800  ceFEPIme (MAXIPIME) 2 g in dextrose 5 % 50 mL IVPB  Status:  Discontinued     07/19/16 1912   07/19/16 1100  vancomycin (VANCOCIN) 500 mg in sodium chloride 0.9 % 100 mL IVPB  Status:  Discontinued     07/19/16 1645   07/19/16 0800  piperacillin-tazobactam (ZOSYN) IVPB 3.375 g  Status:  Discontinued     07/19/16 1645   07/19/16 0200  cefTRIAXone (ROCEPHIN) 2 g in dextrose 5 % 50 mL IVPB  07/19/16 0259   07/19/16 0130  cefTRIAXone (ROCEPHIN) 1 g in dextrose 5 % 50 mL IVPB  Status:  Discontinued     07/19/16 0140   07/19/16 0115  piperacillin-tazobactam (ZOSYN) IVPB 3.375 g     07/19/16 0219   07/19/16 0115  vancomycin (VANCOCIN) IVPB 1000 mg/200 mL premix     07/19/16 0243     LINES / TUBES:  OETT 8/3 >>>8/13 Rt CHEST TUBE 8/5 >>> RUE DL PICC 7/27 >>> Foley >>>  Continuous Infusions: . 0.9 % NaCl with KCl 40 mEq / L 75 mL/hr (08/26/16 0446)  . cefTAZidime (FORTAZ)  IV    . dextrose     Objective: Vitals:   08/25/16 1525 08/25/16 2158 08/26/16 0500 08/26/16 0504  BP: (!) 166/82 (!) 192/76  (!) 171/107  Pulse: 75 78  75  Resp: _0 Temp: 97.9 F (36.6 C) 98.5 F (36.9 C)  97.9 F (36.6 C)  TempSrc: Oral Oral  Oral  SpO2: 99% 98%  99%  Weight:   72.9 kg (160 lb 11.5 oz)   Height:        Intake/Output Summary (Last 24 hours) at 08/26/16 0939 Last data filed at 08/26/16 0933  Gross per 24 hour  Intake          2005.75 ml  Output              584 ml  Net          1421.75 ml   Filed Weights   08/24/16 0500 08/25/16 0500 08/26/16 0500  Weight: 73.1 kg (161 lb 2.5 oz) 73.4 kg (161 lb 13.1 oz) 72.9 kg (160 lb 11.5 oz)   Physical Exam  Constitutional: Appears calm, not in acute distress CVS: RRR, S1/S2 +, no gallops, no carotid bruit.  Pulmonary: Effort and breath sounds normal, no stridor, rhonchi, wheezes, rales.  Abdominal: Soft. BS +,  no distension,  tenderness, rebound or guarding.  Musculoskeletal: Normal range of motion. No edema and no tenderness.   Data Reviewed: I have reviewed patient's blood work today, vital signs, discussed with patient at bedside  CBC:  Recent Labs Lab 08/21/16 0457 08/22/16 0405 08/23/16 0431 08/25/16 0355 08/26/16 0426  WBC 12.7* 15.9* 9.7 8.3 7.2  NEUTROABS 10.1* 12.7* 6.8 5.5 4.5  HGB 8.3* 7.9* 7.6* 7.9* 8.1*  HCT 26.5* 26.0* 24.8* 25.3* 25.2*  MCV 91.1 90.9 91.2 88.5 88.7  PLT 96* 88* 85* 105* 858*   Basic Metabolic Panel:  Recent Labs Lab 08/22/16 0405  08/23/16 0431 08/23/16 1822 08/24/16 1030 08/25/16 0355 08/25/16 1714 08/26/16 0426  NA 153*  154*  < > 145 142 139 135  136 135  136 138  K 3.4*  3.4*  < > 3.3* 3.1* 3.2* 3.0*  3.0* 3.3*  3.3* 3.6  CL 116*  116*  < > 111 108 106 103  105 104  105 108  CO2 30  29  < > _1 GLUCOSE 111*  111*  < > 126* 114* 98 100*  104* 87  89 81  BUN 34*  34*  < > 31* 25* 23* _2 CREATININE 2.04*  2.00*  < > 2.05* 1.99* 1.88* 1.76*  1.76* 1.65*  1.63* 1.54*  CALCIUM 7.7*  7.7*  < > 7.5* 7.4* 7.3* 7.2*  7.3* 7.4*  7.3* 7.3*  MG 1.7  --  1.4*  --  1.7 1.4*  --  1.8  PHOS 4.4  < > 3.7 3.3 3.3 3.3 3.3 3.5  < > = values in this interval not displayed.  Liver Function Tests:  Recent Labs Lab 08/23/16 1822 08/24/16 1030 08/25/16 0355 08/25/16 1714 08/26/16 0426  ALBUMIN 1.4* 1.4* 1.4* 1.4* 1.4*   Coagulation Profile:  Recent Labs Lab 08/21/16 0457 08/22/16 0405 08/23/16 0431 08/25/16 0355 08/26/16 0426  INR 1.42 1.56 1.43 1.26 1.27   CBG:  Recent Labs Lab 08/22/16 0329 08/22/16 0750 08/22/16 1148 08/22/16 1612 08/22/16 2017  GLUCAP 103* 106* 115* 110* 131*   Urine analysis:    Component Value Date/Time   COLORURINE RED (A) 08/22/2016 1015   APPEARANCEUR CLOUDY (A) 08/22/2016 1015   LABSPEC 1.009 08/22/2016 1015   PHURINE 6.0 08/22/2016 1015   GLUCOSEU (A)  08/22/2016 1015    TEST NOT REPORTED DUE TO COLOR INTERFERENCE OF URINE PIGMENT   HGBUR (A) 08/22/2016 1015    TEST NOT REPORTED DUE TO COLOR INTERFERENCE OF URINE PIGMENT   BILIRUBINUR (A) 08/22/2016 1015    TEST NOT REPORTED DUE TO COLOR INTERFERENCE OF URINE PIGMENT   KETONESUR (A) 08/22/2016 1015    TEST NOT REPORTED DUE TO COLOR INTERFERENCE OF URINE PIGMENT   PROTEINUR (A) 08/22/2016 1015    TEST NOT REPORTED DUE TO COLOR INTERFERENCE OF URINE PIGMENT   NITRITE (A) 08/22/2016 1015    TEST NOT REPORTED DUE TO COLOR INTERFERENCE OF URINE PIGMENT   LEUKOCYTESUR (A) 08/22/2016 1015    TEST NOT REPORTED DUE TO COLOR INTERFERENCE OF URINE PIGMENT   Radiology Studies: No results found.    Scheduled Meds: . Chlorhexidine Gluconate Cloth  6 each Topical Daily  . feeding supplement (ENSURE ENLIVE)  237 mL Oral BID BM  . folic acid  1 mg Oral Daily  . Gerhardt's butt cream   Topical Daily  . hydrALAZINE  50 mg Oral Q8H  . lactulose  30 g Oral Daily  . mouth rinse  15 mL Mouth Rinse q12n4p  . multivitamin  15 mL Oral Daily  . pantoprazole  40 mg Oral Daily  . rifaximin  550 mg Oral BID  . risperiDONE  0.5 mg Oral BID  . sodium chloride flush  10-40 mL Intracatheter Q12H  . sodium chloride flush  3 mL Intravenous Q12H  . spironolactone  25 mg Oral Daily  . thiamine  100 mg Oral Daily   Continuous Infusions: . 0.9 % NaCl with KCl 40 mEq / L 75 mL/hr (08/26/16 0446)  . cefTAZidime (FORTAZ)  IV    . dextrose       LOS: 38 days   Time spent: 25 minutes  Faye Ramsay, MD  [facility] Pager [phone]  If 7PM-7AM, please contact night-coverage [email or web address] Password [facility] 08/26/2016, 9:39 AM

## 2016-08-26 NOTE — Progress Notes (Signed)
Pharmacy Antibiotic Note  Johnathan [name] is a 46 y.o. male admitted on 07/19/2016 with MSSA + Pseudomonas bacteremia with TTE (+) tricuspid valve IE.Marland Kitchen  Pharmacy has been consulted for Ceftazidime dosing, currently renally adjusted, through 8/25.  Cr continues to improve.  Pt is afebrile and WBC has corrected.  Plan: Change Ceftazidime to 2g IV q8, continue through 8/25 as per ID plan.  Height: _0  (180.3 cm) Weight: 160 lb 11.5 oz (72.9 kg) IBW/kg (Calculated) : 75.3  Temp (24hrs), Avg:98.1 F (36.7 C), Min:97.9 F (36.6 C), Max:98.5 F (36.9 C)   Recent Labs Lab 08/21/16 0457  08/22/16 0405  08/23/16 0431 08/23/16 1822 08/24/16 1030 08/25/16 0355 08/25/16 1714 08/26/16 0426  WBC 12.7*  --  15.9*  --  9.7  --   --  8.3  --  7.2  CREATININE 2.13*  < > 2.04*  2.00*  < > 2.05* 1.99* 1.88* 1.76*  1.76* 1.65*  1.63* 1.54*  < > = values in this interval not displayed.  Estimated Creatinine Clearance: 62.5 mL/min (A) (by C-G formula based on SCr of 1.54 mg/dL (H)).    Allergies  Allergen Reactions  . Ativan [Lorazepam]     Paradoxical Reaction    Zosyn 7/13 >> 7/13 Vanc 7/13 >> 7/13; 8/3 >>8/7 Cefepime 7/13 >>8/6 Nafcillin 7/13 >> 7/16 Ceftazidime 8/6 >> [8/25]  7/12 BCx - MSSA + PSA (pan sensitive) 7/13 CSF cx - negative 7/13 RVP- negative 7/13 MRSA PCR - negative 7/13 UCx - negative 7/14 BCx - 1/2 MSSA 7/15 BCx - neg 7/17 Urine- neg 7/26 pleural fluid- gram stain neg, cx ngF 7/26 AFB- neg 7/26 Fungal: pending...  8/3 blood cx: no growth x 3 days 8/3 BAL: candida tropicalis (contaminant?)  8/3 Blood - negative 8/6 body fluid cx >> negative 8/6 blood cx>> negative  Thank you for allowing pharmacy to be a part of this patient's care.   Lewie Chamber., PharmD Clinical Pharmacist [facility]

## 2016-08-26 NOTE — Progress Notes (Signed)
Pt had some reddish colored urin at this time. No signs of urgency or urinary frequency. Will monitor pt.

## 2016-08-27 DIAGNOSIS — Z9689 Presence of other specified functional implants: Secondary | ICD-10-CM

## 2016-08-27 LAB — RENAL FUNCTION PANEL
ALBUMIN: 1.5 g/dL — AB (ref 3.5–5.0)
ANION GAP: 5 (ref 5–15)
Albumin: 1.4 g/dL — ABNORMAL LOW (ref 3.5–5.0)
Anion gap: 5 (ref 5–15)
BUN: 13 mg/dL (ref 6–20)
BUN: 13 mg/dL (ref 6–20)
CALCIUM: 7.4 mg/dL — AB (ref 8.9–10.3)
CALCIUM: 7.7 mg/dL — AB (ref 8.9–10.3)
CHLORIDE: 112 mmol/L — AB (ref 101–111)
CO2: 22 mmol/L (ref 22–32)
CO2: 22 mmol/L (ref 22–32)
CREATININE: 1.42 mg/dL — AB (ref 0.61–1.24)
Chloride: 112 mmol/L — ABNORMAL HIGH (ref 101–111)
Creatinine, Ser: 1.51 mg/dL — ABNORMAL HIGH (ref 0.61–1.24)
GFR calc non Af Amer: 54 mL/min — ABNORMAL LOW (ref >60)
GFR calc non Af Amer: 58 mL/min — ABNORMAL LOW (ref >60)
GLUCOSE: 88 mg/dL (ref 65–99)
Glucose, Bld: 75 mg/dL (ref 65–99)
PHOSPHORUS: 3.6 mg/dL (ref 2.5–4.6)
Phosphorus: 3.4 mg/dL (ref 2.5–4.6)
Potassium: 4.2 mmol/L (ref 3.5–5.1)
Potassium: 4.3 mmol/L (ref 3.5–5.1)
SODIUM: 139 mmol/L (ref 135–145)
Sodium: 139 mmol/L (ref 135–145)

## 2016-08-27 LAB — CBC WITH DIFFERENTIAL/PLATELET
Basophils Absolute: 0.1 10*3/uL (ref 0.0–0.1)
Basophils Relative: 1 %
EOS PCT: 5 %
Eosinophils Absolute: 0.4 10*3/uL (ref 0.0–0.7)
HCT: 24.6 % — ABNORMAL LOW (ref 39.0–52.0)
Hemoglobin: 7.9 g/dL — ABNORMAL LOW (ref 13.0–17.0)
LYMPHS ABS: 2.7 10*3/uL (ref 0.7–4.0)
LYMPHS PCT: 35 %
MCH: 28 pg (ref 26.0–34.0)
MCHC: 32.1 g/dL (ref 30.0–36.0)
MCV: 87.2 fL (ref 78.0–100.0)
MONO ABS: 0.5 10*3/uL (ref 0.1–1.0)
MONOS PCT: 6 %
Neutro Abs: 4 10*3/uL (ref 1.7–7.7)
Neutrophils Relative %: 53 %
PLATELETS: 130 10*3/uL — AB (ref 150–400)
RBC: 2.82 MIL/uL — ABNORMAL LOW (ref 4.22–5.81)
RDW: 18.7 % — AB (ref 11.5–15.5)
WBC: 7.6 10*3/uL (ref 4.0–10.5)

## 2016-08-27 LAB — APTT: aPTT: 70 seconds — ABNORMAL HIGH (ref 24–36)

## 2016-08-27 LAB — PROTIME-INR
INR: 1.32
Prothrombin Time: 16.5 seconds — ABNORMAL HIGH (ref 11.4–15.2)

## 2016-08-27 LAB — MAGNESIUM: Magnesium: 1.4 mg/dL — ABNORMAL LOW (ref 1.7–2.4)

## 2016-08-27 MED ORDER — PROSIGHT PO TABS
1.0000 | ORAL_TABLET | Freq: Every day | ORAL | Status: DC
  Administered 2016-08-27 – 2016-09-03 (×8): 1 via ORAL
  Filled 2016-08-27 (×8): qty 1

## 2016-08-27 MED ORDER — MAGNESIUM SULFATE 2 GM/50ML IV SOLN
2.0000 g | Freq: Once | INTRAVENOUS | Status: AC
Start: 2016-08-27 — End: 2016-08-27
  Administered 2016-08-27: 2 g via INTRAVENOUS
  Filled 2016-08-27: qty 50

## 2016-08-27 MED ORDER — SPIRONOLACTONE 50 MG PO TABS
50.0000 mg | ORAL_TABLET | Freq: Two times a day (BID) | ORAL | Status: DC
  Administered 2016-08-27 – 2016-08-28 (×2): 50 mg via ORAL
  Filled 2016-08-27 (×2): qty 1

## 2016-08-27 MED ORDER — FUROSEMIDE 40 MG PO TABS
40.0000 mg | ORAL_TABLET | Freq: Every day | ORAL | Status: DC
  Administered 2016-08-27 – 2016-08-28 (×2): 40 mg via ORAL
  Filled 2016-08-27 (×2): qty 1

## 2016-08-27 MED ORDER — MAGNESIUM SULFATE 2 GM/50ML IV SOLN
2.0000 g | Freq: Once | INTRAVENOUS | Status: AC
  Administered 2016-08-27: 2 g via INTRAVENOUS
  Filled 2016-08-27: qty 50

## 2016-08-27 NOTE — Progress Notes (Signed)
Physical Therapy Treatment Patient Details Name: Johnathan [name] MRN: [id number] DOB: November 27, 1970 Today's Date: 08/27/2016    History of Present Illness Pt admitted 7/13 with sepsis, MSSA bacteremia, B LE celllulitis, tricuspid valve vegetation, R LL cavity PNA with pleural effusion, hep C. Developed encephalopathy and agitation and transferred to ICU 7/17. + DVT LUE. Intubated 8/3-8/13; s/p CT placement 8/5 secondary to effusion. PMH: polysubstance abuse.    PT Comments    Patient able to achieve current acute PT goals, but updated this session to mod I.  Currently remains unsafe for d/c home alone, but if home, would recommend HHPT and aide if able to get charity care.  Will continue skilled PT during acute stay.   Follow Up Recommendations  Supervision/Assistance - 24 hour;Home health PT     Equipment Recommendations  Rolling walker with 5" wheels    Recommendations for Other Services       Precautions / Restrictions Precautions Precautions: Fall Precaution Comments: Chest tube    Mobility  Bed Mobility   Bed Mobility: Supine to Sit       Sit to supine: Supervision   General bed mobility comments: pt. raised HOB prior to return to supine, no physical assist needed  Transfers Overall transfer level: Needs assistance Equipment used: Rolling walker (2 wheeled) Transfers: Sit to/from Stand Sit to Stand: Supervision         General transfer comment: cues for hand placement  Ambulation/Gait Ambulation/Gait assistance: Min guard;Supervision Ambulation Distance (Feet): 200 Feet Assistive device: Rolling walker (2 wheeled) Gait Pattern/deviations: Step-through pattern;Decreased stride length;Trunk flexed     General Gait Details: using walker for balance, and to hold chest tube, in room at times without walker, but S for safety due to slightly unsteady   Stairs            Wheelchair Mobility    Modified Rankin (Stroke Patients Only)        Balance Overall balance assessment: Needs assistance   Sitting balance-Leahy Scale: Good       Standing balance-Leahy Scale: Fair Standing balance comment: able to mobilize short distance no UE support with S                            Cognition Arousal/Alertness: Awake/alert Behavior During Therapy: Impulsive Overall Cognitive Status: Impaired/Different from baseline Area of Impairment: Problem solving;Safety/judgement                 Orientation Level: Disoriented to;Time Current Attention Level: Selective Memory: Decreased short-term memory Following Commands: Follows multi-step commands consistently Safety/Judgement: Decreased awareness of safety;Decreased awareness of deficits            Exercises      General Comments        Pertinent Vitals/Pain Faces Pain Scale: Hurts little more Pain Location: abdomen along with nausea Pain Descriptors / Indicators: Aching;Discomfort;Tightness Pain Intervention(s): Monitored during session    Home Living                      Prior Function            PT Goals (current goals can now be found in the care plan section) Acute Rehab PT Goals Patient Stated Goal: To get tube out PT Goal Formulation: With patient Time For Goal Achievement: 09/03/16 Potential to Achieve Goals: Fair Progress towards PT goals: Goals met and updated - see care plan    Frequency  Min 3X/week      PT Plan Discharge plan needs to be updated    Co-evaluation              AM-PAC PT "6 Clicks" Daily Activity  Outcome Measure  Difficulty turning over in bed (including adjusting bedclothes, sheets and blankets)?: None Difficulty moving from lying on back to sitting on the side of the bed? : None Difficulty sitting down on and standing up from a chair with arms (e.g., wheelchair, bedside commode, etc,.)?: A Little Help needed moving to and from a bed to chair (including a wheelchair)?: A Little Help  needed walking in hospital room?: A Little Help needed climbing 3-5 steps with a railing? : A Little 6 Click Score: 20    End of Session   Activity Tolerance: Patient limited by fatigue Patient left: in bed;with call bell/phone within reach   PT Visit Diagnosis: Unsteadiness on feet (R26.81);Other symptoms and signs involving the nervous system (R29.898);Muscle weakness (generalized) (M62.81)     Time: 2536-6440 PT Time Calculation (min) (ACUTE ONLY): 29 min  Charges:  $Gait Training: 23-37 mins                    G CodesMagda Kiel, [place] [phone] 08/27/2016    Reginia Naas 08/27/2016, 4:57 PM

## 2016-08-27 NOTE — Progress Notes (Signed)
PULMONARY / CRITICAL CARE MEDICINE CONSULT   Name: Johnathan [name] MRN: [id number] DOB: 1970/06/13    ADMISSION DATE:  07/19/2016 CONSULTATION DATE:  07/23/2016  REFERRING MD:  Ree Kida  HISTORY OF PRESENT ILLNESS:  46 y.o. malew Hep C?, IVDA, (recently injected opana) apparently c/o fever intermittently for  1 week, and redness over the dorsum of the right foot and slight swelling. Pt notes injecting Opana about 2 days ago and cellulitis in right foot noted on admission 7/13 with labs showing EKG and abnormal LFTs with hyponatremia 123 and right lower lobe infiltrate on chest x-ray. Found to have MSSA bacteremia, possible meningitis, and LE cellulitis.  Echocardiogram Showed EF 54-65%, grade 1 diastolic dysfunction, tricuspid valve with mobile vegetation. On 7/17 He developed acute encephalopathy with agitation, becoming belligerent and combative. He denies ETOH abuse, but admitted to IVDA. He was placed on CIWA protocol, however, scores remained 19-23, patient was not responsive  to ativan, transferred to ICU for precedex gtt   SUBJECTIVE:  Feel better  No distress.  Wants to go home   VITAL SIGNS: BP (!) 159/96 (BP Location: Left Arm)   Pulse 75   Temp 98.1 F (36.7 C) (Oral)   Resp 18   Ht _0  (1.803 m)   Wt 160 lb 11.5 oz (72.9 kg)   SpO2 99%   BMI 22.42 kg/m   HEMODYNAMICS:    VENTILATOR SETTINGS:    INTAKE / OUTPUT: I/O last 3 completed shifts: In: 2987.5 [P.O.:930; I.V.:1857.5; IV Piggyback:200] Out: 904 [Chest KCLE:751]  PHYSICAL EXAMINATION: General appearance:  46 Year old  Male,  NAD, conversant  Eyes: anicteric sclerae, moist conjunctivae; PERRL, EOMI bilaterally. Mouth:  membranes and no mucosal ulcerations; normal hard and soft palate Lungs/chest: CTA, with normal respiratory effort and no intercostal retractions. Right CT dressing intact. Straw colored pleural fluid draining. No airleak. ~ 512m/24 hrs CV: RRR, no MRGs  Abdomen: Soft, non-tender;  no masses or HSM Extremities: No peripheral edema or extremity lymphadenopathy Skin: Normal temperature, turgor and texture; no rash, ulcers or subcutaneous nodules Psych: Appropriate affect, alert and oriented to person, place and time  LABS:  BMET  Recent Labs Lab 08/26/16 0426 08/26/16 1758 08/27/16 0434  NA 138 138 139  K 3.6 4.1 4.2  CL 108 109 112*  CO2 _1 BUN _2 CREATININE 1.54* 1.51* 1.51*  GLUCOSE 81 80 75    Electrolytes  Recent Labs Lab 08/25/16 0355  08/26/16 0426 08/26/16 1758 08/27/16 0434  CALCIUM 7.2*  7.3*  < > 7.3* 7.5* 7.4*  MG 1.4*  --  1.8  --  1.4*  PHOS 3.3  < > 3.5 3.3 3.4  < > = values in this interval not displayed.  CBC  Recent Labs Lab 08/25/16 0355 08/26/16 0426 08/27/16 0434  WBC 8.3 7.2 7.6  HGB 7.9* 8.1* 7.9*  HCT 25.3* 25.2* 24.6*  PLT 105* 117* 130*    Coag's  Recent Labs Lab 08/25/16 0355 08/26/16 0426 08/27/16 0434  APTT 65* 84* 70*  INR 1.26 1.27 1.32    Sepsis Markers No results for input(s): LATICACIDVEN, PROCALCITON, O2SATVEN in the last 168 hours.  ABG No results for input(s): PHART, PCO2ART, PO2ART in the last 168 hours.  Liver Enzymes  Recent Labs Lab 08/26/16 0426 08/26/16 1758 08/27/16 0434  ALBUMIN 1.4* 1.4* 1.4*    Cardiac Enzymes No results for input(s): TROPONINI, PROBNP in the last 168 hours.  Glucose  Recent Labs Lab 08/22/16  0050 08/22/16 0329 08/22/16 0750 08/22/16 1148 08/22/16 1612 08/22/16 2017  GLUCAP 103* 103* 106* 115* 110* 131*    Imaging No results found.  STUDIES:  RENAL U/S 7/13:   IMPRESSION: 1. Mild right pelvicaliectasis, with ureteral jet noted in the urinary bladder excluding significant ureteral obstruction. 2. Small left renal cyst. 3. Pleural effusions and abdominal ascites. LP 7/13:  Tube 1 - WBC 51 (62% lymph, 29% lymph, 9% mono), RBC 555, Protein 88 , & Glucose 52. / Tube 4 - WBC 180 (58% neutro, 31% lymph, 11% monocytes) & RBC  36. TTE 7/14:  LV normal in size with EF 55-60%. No regional wall motion abnormalities & grade 1 diastolic dysfunction. LA upper limits of normal in size & RA normal in size. RV normal in size and function. No aortic stenosis or regurgitation. Aortic root normal in size. No mitral stenosis or regurgitation. No pulmonic stenosis or regurgitation with poorly visualized valve. No tricuspid regurgitation. Medium sized mobile vegetation noted on tricuspid valve. No pericardial effusion. CT HEAD W/O 7/16:  Progressive atrophy since 2011.  No acute intracranial findings. No abnormal enhancement or vasogenic edema to suggest septic emboli to the brain. VENOUS DUPLEX BILATERAL LOWER EXTREMITY 7/17:  No DVT or SVT.  CTA CHEST 7/24:   IMPRESSION: 1. Negative for a pulmonary embolism. 2. Extensive pleural-parenchymal lung disease bilaterally. Large right pleural effusion and moderate-sized left pleural effusion. Pleural-based nodular opacities could represent an infectious etiology but also raise concern for a neoplastic process. Concern for a cavitary lesion and necrotic tissue in the right lung. Consider sampling of the pleural fluid. Recommend follow-up CT when the pleural fluid and acute process have resolved to exclude neoplastic disease. 3. Severe emphysematous disease. 4. Cirrhosis with evidence for portal hypertension based on the splenomegaly and ascites. 5. 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 Recommend annual imaging followup by CTA or MRA. This recommendation follows 2010 ACCF/AHA/AATS/ACR/ASA/SCA/SCAI/SIR/STS/SVM Guidelines for the Diagnosis and Management of Patients with Thoracic Aortic Disease. Circulation. 2010; 121: C588-F027 VENOUS DUPLEX LUE 7/24:  Acute DVT involving small portion of left radial vein in mid-forearm.  CT HEAD W/O 7/26:  Normal head CT. Right Pleural Effusion 7/26:  1.5 L hazy yellow fluid by IR. Protein <3.0, Albumin <1.0, Amylase 39, Glucose 72, LDH  72, & WBC 2431 (11% lymph, 82% neutro, & 7% mono). Cytology negative for malignancy.  MRI BRAIN W/O 8/2:  Exam is limited to diffusion only. Negative for acute infarct. No area of restricted diffusion. EEG 8/2:  This EEG is abnormal due to moderate diffuse slowing of the background. Lingula BAL 8/3:  WBC 78 (39% lymph, 31% neutro, 30% mono). Reported DAH.  TTE 8/3:  LV normal in size with EF 55-60%. No regional wall motion abnormality & grade 1 diastolic dysfunction. LA & RA normal in size. RV normal in size and function. No aortic stenosis or regurgitation. Aortic root normal in size. No mitral stenosis or regurgitation. No pulmonic stenosis. Trivial regurgitation. Trivial pericardial effusion as well. Notably no vegetations were seen on this exam. Right Pleural Effusion 8/5:  WBC 616 (1% lymph, 1% eos, & 98% neutro). PORT CXR 8/5:  Previously reviewed by me. Right-sided predominant opacities. Right-sided chest tube in place. Endotracheal tube in good position. Enteric feeding tube in good position. Right upper extremity PICC line in good position. Complete Abdominal U/S 8/6: IMPRESSION: 1. Stable cirrhotic changes involving the liver without definite focal hepatic lesion. 2. Mild common bile duct dilatation  without obvious common bile duct stone or intrahepatic biliary dilatation. 3. Poor visualization of the pancreas. 4. Increased echogenicity of both kidneys suggesting medical renal disease. No hydronephrosis. 5. Large volume ascites and bilateral pleural effusions. Port CXR 8/7:  Previously reviewed by me. Hazy opacities bilateral lower lung zones. Endotracheal tube & enteric feeding tube in good position. Right chest tube in good position.  PORT CXR 8/11:  Personally reviewed by me. Patchy hazy opacities bilaterally which are improving. No appreciable residual pleural effusion on the right. Right-sided chest tube in good position. Endotracheal tube and right upper extremity central venous  catheter in good position. Enteric feeding tube coursing below diaphragm. PORT ABD X-RAY 8/11: IMPRESSION: 1. Paucity of bowel gas without evidence of enteric obstruction. 2. Enteric tube tip and side port project over the expected location of the mid body of the stomach. CT abd / pelv 8/12 > persistent bilateral pleural effusion (though improved), 3cm irregular rounded density in RLL.  Cirrhosis with splenomegaly and ascites, anasarca. Inflammatory vs infectious process involving duodenum.  MICROBIOLOGY: Blood Cultures x2 7/12:  2/2 Bottles MSSA & 1/2 Bottles Pseudomonas aeruginosa  CSF Culture 7/13:  Negative  MRSA PCR 7/13:  Negative HIV 7/13:  Negative Respiratory Viral Panel PCR 7/13:  Negative  Urine Culture 7/13:  Multiple Species Present Blood Cultures x2 7/14:  1/2 Bottles MSSA Blood Culture x1 7/15:  Negative  MRSA PCR 7/17:  Negative  Urine Culture 7/17:  Negative  Urine Streptococcal Antigen 7/17:  Negative Urine Legionella Antigen 7/17:  Negative  Right Pleural Fluid Culture 7/26 >>> Bacteria Negative / AFB pending / Fungus pending Lingula BAL 8/3:  Candida tropicalis  Blood Cultures x2 8/3:  Negative  Right Pleural Culture 8/5:  Negative  Blood Culture x2 8/6:  Negative   ANTIBIOTICS: Ceftriaxone 7/13 (x1 dose) Zosyn 7/13 (x1 dose) Nafcillin 7/13 - 7/16 Cefepime 7/13 - 8/6 Vancomycin 7/13 (x1 dose); restarted 8/3 - 8/7 Tressie Ellis 8/6 >>> (plan to continue through 8/25 per ID recs)  SIGNIFICANT EVENTS: 7/17 - Transfer to ICU for precedex infusion 7/21 - Precedex off  7/25 - PCCM signed off 8/02 - Patient hypothermic & more agitated 8/03 - acute tachypnea w/ "guppy breathing" >> emergently intubated w/ bronchoscopy showing "mild diffuse alveolar hemorrhage" 8/05 - Right chest tube inserted w/ 350 mL serous fluid. Bicarb gtt started for acidosis 8/06 - Switched off Cefepime to [place] given potential for worsening encephalopathy  8/08 - Albumin w/ Lasix today.  Improving UOP.  8/09 - Albumin w/ Lasix q6hr x3 doses. Precedex started in an attempt to wean Fentanyl drip.  8/11 - N/V of bilious emesis overnight & continued diuresis 8/12 - Continued diuresis w/ Lasix & added Aldactone daily. Patient apneic during SBT. Decreasing chest tube output. 8/16 diuretics backed down d/t hypernatremia   LINES/TUBES: OETT 8/3 >>>8/13 R CHEST TUBE 8/5 >>> RUE DL PICC 7/27 >>> Foley >>>  ASSESSMENT / PLAN: Treated Issues:  Acute hypoxic respiratory failure - Multifactorial.  Extubated 8/13 and tolerated well. Hemoptysis - Likely due to cavitary pneumonia. Improving. No bright red blood. 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  MSSA & Pseudomonas Bacteremia/Endocarditis LUE Radial Vein DVT Residual ALI  Right Lower Extremity Cellulitis - Resolved.  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 Currently on fortaz through 8/25 per ID Transudative Right Pleural Effusion - S/P Chest tube placement. 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 >His current chest tube output is averaging ~ 500 a day. >His current diuretic regimen is spironolactone -->need to escalate this if we hope to get his CT out and hope to keep effusion from re-occurring.   Plan Will add lasix 40 mg/d Increase spironolactone to 50 bid Repeat chem in am Close obs of CT output-->would like to see if we can maximize diuretics as if not we will have rapid re-accumulation of his pleural fluid. Ideally want to see < 268m/24 hrs Given his significant improvement clinically would he be a TIPS  candidate if we can't control his effusion (will d/w [place])  PErick ColaceACNP-BC LGrand View-on-HudsonPager # [phone] # [phone] no answer'

## 2016-08-27 NOTE — Progress Notes (Signed)
PROGRESS NOTE  Johnathan [name]  WGY:[id number] DOB: July 17, 1970 DOA: 07/19/2016   PCP: [name], MD   Brief Narrative:  46 yo Male PMHx Hep C and IVDA, Polysubstance Abuse, presented with one week duration of fevers, redness and swelling on the dorsum of the right foot. Pt with ongoing use of opioids, injected Opana 2 days PTA. Pt admitted with right foot cellulitis as well as RLL infiltrate, found to have MSSA bacteremia with possible Meningitis. On 7/17, pt developed acute encephalopathy with agitation, combative requiring precedex drip.   Subjective: Pt is more alert this AM, has refused certain medications especially liquids. Tolerating diet well.   Assessment & Plan:   RLL cavitary PNA /- septic emboli v/s aspiration - Continue current antibiotic regimen: Will require long-term IV antibiotics.  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 - due to cavitary PNA, septic emboli and aspiration  - PCCM following for effusion - current chest tube output ~ 500 cc per day  - per PCCM, need to escalate diuretic coverage if able to get CT out - PCCM added Lasix 40 mg per day, increased Spironolactone 50 mg BID - if we can not control effusion, pt may be TIPS candidate as he is clinically improving  - no further hemoptysis   MSSA and Pseudomonas bacteremia w/ sepsis/Tricuspid valve Endocarditis  - CSF culture negative - Patient will require LTAC or SNF secondary to IV drug abuse patient cannot go home with long-term access - ceftazidime changed to 2 g IV every 8 hours, continue through 08/31/2016 as per ID plan  Hepatitis C - liver cirrhosis  - Patient currently not candidate for treatment secondary to drug abuse - outpatient follow up   LUE Radial Vein DVT  - Seen on duplex 7/24. No PE on CTA 7/24  Acute Encephalopathy - resolved    Acute Renal Failure  - Cr  3.17 on admission:  Secondary to dehydration.  - overall improving  - BMP in MA  Nausea w/ Bilious Emesis - ? Duodenitis - resolved - pt tolerating regular diet well    Hypokalemia - with low Magnesium as well - will continue to supplement - BMP and Mg in AM  Hypernatremia - resolved   Drug abuse and dependence  - deferred per pt's request   Protein calorie malnutrition - nutritionist consulted, appreciate recommendations   Thrombocytopenia, anemia - improving   DVT prophylaxis: SCD Code Status: Full Family Communication: no family at bedside Disposition Plan: pt with no insurance so can not go to [facility], SNF also recommended not sure if pt will be approved   Consultants:  [facility] M ID   Procedures/Significant Events:  7/13 - Admit to [facility] 7/17 - Transfer to ICU for precedex infusion 7/21 - Precedex off  7/22 - TRH resumed care 7/24 - venous duplex L UE radial vein DVT 7/25 - PCCM signed off 8/02 - Patient hypothermic & more agitated 8/03 - acute tachypnea w/ "guppy breathing" > emergently intubated w/ bronchoscopy showing "mild diffuse alveolar hemorrhage" 8/05 - Right chest tube inserted w/ 350 mL serous fluid. Bicarb gtt started for acidosis 8/06 - Switched off Cefepime to [place] given potential for worsening encephalopathy  8/08 - Albumin w/ Lasix today. Improving UOP.  8/09 - Albumin w/ Lasix q6hr x3 doses. Precedex started in an attempt to wean Fentanyl drip.  8/11 - N/V of bilious emesis overnight & continued diuresis 8/12 - Continued diuresis w/  Lasix & added Aldactone daily. Patient apneic during SBT. Decreasing chest tube output. 8/13 - extubated   RENAL U/S 7/13:   IMPRESSION: 1. Mild right pelvicaliectasis, with ureteral jet noted in the urinary bladder excluding significant ureteral obstruction. 2. Small left renal cyst. 3. Pleural effusions and abdominal ascites. LP 7/13:  Tube 1 - WBC 51 (62% lymph, 29% lymph, 9% mono), RBC 555, Protein 88 , & Glucose 52. / Tube 4 - WBC  180 (58% neutro, 31% lymph, 11% monocytes) & RBC 36. TTE 7/14:  LV normal in size with EF 55-60%. No regional wall motion abnormalities & grade 1 diastolic dysfunction. LA upper limits of normal in size & RA normal in size. RV normal in size and function. No aortic stenosis or regurgitation. Aortic root normal in size. No mitral stenosis or regurgitation. No pulmonic stenosis or regurgitation with poorly visualized valve. No tricuspid regurgitation. Medium sized mobile vegetation noted on tricuspid valve. No pericardial effusion. CT HEAD W/O 7/16:  Progressive atrophy since 2011.  No acute intracranial findings. No abnormal enhancement or vasogenic edema to suggest septic emboli to the brain. VENOUS DUPLEX BILATERAL LOWER EXTREMITY 7/17:  No DVT or SVT.  CTA CHEST 7/24:  Negative for a pulmonary embolism. Extensive pleural-parenchymal lung disease bilaterally. Large right pleural effusion and moderate-sized left pleural effusion. Pleural-based nodular opacities could represent an infectious etiology but also raise concern for a neoplastic process. Concern for a cavitary lesion and necrotic tissue in the right lung. Consider sampling of the pleural fluid. Recommend follow-up CT when the pleural fluid and acute process have resolved to exclude neoplastic disease. Severe emphysematous disease. Cirrhosis with evidence for portal hypertension based on the splenomegaly and ascites. 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  VENOUS DUPLEX LUE 7/24:  Acute DVT involving small portion of left radial vein in mid-forearm.  CT HEAD W/O 7/26:  Normal head CT. Right Pleural Effusion 7/26:  1.5 L hazy yellow fluid by IR. Protein <3.0, Albumin <1.0, Amylase 39, Glucose 72, LDH 72, & WBC 2431 (11% lymph, 82% neutro, & 7% mono). Cytology negative for malignancy.  MRI BRAIN W/O 8/2:  Exam is limited to diffusion only. Negative for acute infarct. No area of restricted diffusion. EEG 8/2:  This EEG is  abnormal due to moderate diffuse slowing of the background. Lingula BAL 8/3:  WBC 78 (39% lymph, 31% neutro, 30% mono). Reported DAH.  TTE 8/3:  LV normal in size with EF 55-60%. No regional wall motion abnormality & grade 1 diastolic dysfunction. LA & RA normal in size. RV normal in size and function. No aortic stenosis or regurgitation. Aortic root normal in size. No mitral stenosis or regurgitation. No pulmonic stenosis. Trivial regurgitation. Trivial pericardial effusion as well. Notably no vegetations were seen on this exam. Right Pleural Effusion 8/5:  WBC 616 (1% lymph, 1% eos, & 98% neutro). PORT CXR 8/5:  Previously reviewed by me. Right-sided predominant opacities. Right-sided chest tube in place. Endotracheal tube in good position. Enteric feeding tube in good position. Right upper extremity PICC line in good position. Complete Abdominal U/S 8/6: Stable cirrhotic changes involving the liver without definite focal hepatic lesion. Mild common bile duct dilatation without obvious common bile duct stone or intrahepatic biliary dilatation. Poor visualization of the pancreas. Increased echogenicity of both kidneys suggesting medical renal disease. No hydronephrosis. Large volume ascites and bilateral pleural effusions. Port CXR 8/7:  Previously reviewed by me. Hazy opacities bilateral lower lung zones. Endotracheal tube &  enteric feeding tube in good position. Right chest tube in good position.  PORT CXR 8/11:  Personally reviewed by me. Patchy hazy opacities bilaterally which are improving. No appreciable residual pleural effusion on the right. Right-sided chest tube in good position. Endotracheal tube and right upper extremity central venous catheter in good position. Enteric feeding tube coursing below diaphragm. PORT ABD X-RAY 8/11: Paucity of bowel gas without evidence of enteric obstruction. Enteric tube tip and side port project over the expected location of the mid body of the stomach. CT abd /  pelv 8/12 > persistent bilateral pleural effusion (though improved), 3cm irregular rounded density in RLL.  Cirrhosis with splenomegaly and ascites, anasarca. Inflammatory vs infectious process involving duodenum  Cultures Blood Cultures x2 7/12:  2/2 Bottles MSSA & 1/2 Bottles Pseudomonas aeruginosa  CSF Culture 7/13:  Negative  MRSA PCR 7/13:  Negative HIV 7/13:  Negative Respiratory Viral Panel PCR 7/13:  Negative  Urine Culture 7/13:  Multiple Species Present Blood Cultures x2 7/14:  1/2 Bottles MSSA Blood Culture x1 7/15:  Negative  MRSA PCR 7/17:  Negative  Urine Culture 7/17:  Negative  Urine Streptococcal Antigen 7/17:  Negative Urine Legionella Antigen 7/17:  Negative  Right Pleural Fluid Culture 7/26 >>> Bacteria Negative / AFB pending / Fungus pending Lingula BAL 8/3:  Candida tropicalis  Blood Cultures x2 8/3:  Negative  Right Pleural Culture 8/5:  Negative  Blood Culture x2 8/6:  Negative   Antimicrobials: Anti-infectives    Start     Stop   08/22/16 1100  rifaximin (XIFAXAN) tablet 550 mg         08/19/16 1000  cefTAZidime (FORTAZ) 1 g in dextrose 5 % 50 mL IVPB     08/31/16 2359   08/12/16 1100  cefTAZidime (FORTAZ) 2 g in dextrose 5 % 50 mL IVPB  Status:  Discontinued     08/19/16 0555   08/11/16 1000  ceFEPIme (MAXIPIME) 1 g in dextrose 5 % 50 mL IVPB  Status:  Discontinued     08/12/16 0936   08/10/16 1500  vancomycin (VANCOCIN) 1,250 mg in sodium chloride 0.9 % 250 mL IVPB  Status:  Discontinued     08/11/16 0930   08/10/16 1000  ceFEPIme (MAXIPIME) 2 g in dextrose 5 % 50 mL IVPB  Status:  Discontinued     08/11/16 0930   08/09/16 1300  vancomycin (VANCOCIN) 1,500 mg in sodium chloride 0.9 % 500 mL IVPB     08/09/16 1602   07/22/16 1400  ceFEPIme (MAXIPIME) 2 g in dextrose 5 % 50 mL IVPB  Status:  Discontinued     08/09/16 1226   07/20/16 1500  ceFEPIme (MAXIPIME) 2 g in dextrose 5 % 50 mL IVPB  Status:  Discontinued     07/22/16 1122   07/19/16 2300   vancomycin (VANCOCIN) IVPB 750 mg/150 ml premix  Status:  Discontinued     07/19/16 0953   07/19/16 2200  ceFEPIme (MAXIPIME) 2 g in dextrose 5 % 50 mL IVPB  Status:  Discontinued     07/20/16 1410   07/19/16 2000  nafcillin 2 g in dextrose 5 % 100 mL IVPB  Status:  Discontinued     07/22/16 1130   07/19/16 1915  nafcillin injection 2 g  Status:  Discontinued     07/19/16 1915   07/19/16 1800  ceFEPIme (MAXIPIME) 2 g in dextrose 5 % 50 mL IVPB  Status:  Discontinued     07/19/16 1912  07/19/16 1100  vancomycin (VANCOCIN) 500 mg in sodium chloride 0.9 % 100 mL IVPB  Status:  Discontinued     07/19/16 1645   07/19/16 0800  piperacillin-tazobactam (ZOSYN) IVPB 3.375 g  Status:  Discontinued     07/19/16 1645   07/19/16 0200  cefTRIAXone (ROCEPHIN) 2 g in dextrose 5 % 50 mL IVPB     07/19/16 0259   07/19/16 0130  cefTRIAXone (ROCEPHIN) 1 g in dextrose 5 % 50 mL IVPB  Status:  Discontinued     07/19/16 0140   07/19/16 0115  piperacillin-tazobactam (ZOSYN) IVPB 3.375 g     07/19/16 0219   07/19/16 0115  vancomycin (VANCOCIN) IVPB 1000 mg/200 mL premix     07/19/16 0243     LINES / TUBES:  OETT 8/3 >>>8/13 Rt CHEST TUBE 8/5 >>> RUE DL PICC 7/27 >>> Foley >>>  Continuous Infusions: . cefTAZidime (FORTAZ)  IV Stopped (08/27/16 1016)  . dextrose    . magnesium sulfate 1 - 4 g bolus IVPB     Objective: Vitals:   08/26/16 2307 08/27/16 0553 08/27/16 0700 08/27/16 1443  BP: 139/83 (!) 184/90 (!) 159/96 (!) 158/91  Pulse: 89 77 75 69  Resp: _0 Temp:  98.1 F (36.7 C)  97.8 F (36.6 C)  TempSrc:  Oral  Oral  SpO2: 97% 99%  100%  Weight:      Height:        Intake/Output Summary (Last 24 hours) at 08/27/16 1645 Last data filed at 08/27/16 1445  Gross per 24 hour  Intake             1533 ml  Output              550 ml  Net              983 ml   Filed Weights   08/24/16 0500 08/25/16 0500 08/26/16 0500  Weight: 73.1 kg (161 lb 2.5 oz) 73.4 kg (161 lb 13.1 oz) 72.9 kg  (160 lb 11.5 oz)   Physical Exam  Constitutional: Appears well-developed and well-nourished. No distress.  CVS: RRR, S1/S2 +, no murmurs, no gallops, no carotid bruit.  Pulmonary: Effort and breath sounds normal, mildly diminished on the right side  Abdominal: Soft. BS +,  no distension, tenderness, rebound or guarding.  Musculoskeletal: Normal range of motion. No edema and no tenderness.  Psychiatric: Normal mood and affect. Behavior, judgment, thought content normal.   Data Reviewed: I have reviewed patient's blood work today, vital signs, discussed with patient at bedside  CBC:  Recent Labs Lab 08/22/16 0405 08/23/16 0431 08/25/16 0355 08/26/16 0426 08/27/16 0434  WBC 15.9* 9.7 8.3 7.2 7.6  NEUTROABS 12.7* 6.8 5.5 4.5 4.0  HGB 7.9* 7.6* 7.9* 8.1* 7.9*  HCT 26.0* 24.8* 25.3* 25.2* 24.6*  MCV 90.9 91.2 88.5 88.7 87.2  PLT 88* 85* 105* 117* 272*   Basic Metabolic Panel:  Recent Labs Lab 08/23/16 0431  08/24/16 1030 08/25/16 0355 08/25/16 1714 08/26/16 0426 08/26/16 1758 08/27/16 0434  NA 145  < > 139 135  136 135  136 138 138 139  K 3.3*  < > 3.2* 3.0*  3.0* 3.3*  3.3* 3.6 4.1 4.2  CL 111  < > 106 103  105 104  105 108 109 112*  CO2 28  < > _1 GLUCOSE 126*  < > 98 100*  104* 87  89 81 80 75  BUN 31*  < > 23* _0 CREATININE 2.05*  < > 1.88* 1.76*  1.76* 1.65*  1.63* 1.54* 1.51* 1.51*  CALCIUM 7.5*  < > 7.3* 7.2*  7.3* 7.4*  7.3* 7.3* 7.5* 7.4*  MG 1.4*  --  1.7 1.4*  --  1.8  --  1.4*  PHOS 3.7  < > 3.3 3.3 3.3 3.5 3.3 3.4  < > = values in this interval not displayed.  Liver Function Tests:  Recent Labs Lab 08/25/16 0355 08/25/16 1714 08/26/16 0426 08/26/16 1758 08/27/16 0434  ALBUMIN 1.4* 1.4* 1.4* 1.4* 1.4*   Coagulation Profile:  Recent Labs Lab 08/22/16 0405 08/23/16 0431 08/25/16 0355 08/26/16 0426 08/27/16 0434  INR 1.56 1.43 1.26 1.27 1.32   CBG:  Recent Labs Lab  08/22/16 0329 08/22/16 0750 08/22/16 1148 08/22/16 1612 08/22/16 2017  GLUCAP 103* 106* 115* 110* 131*   Urine analysis:    Component Value Date/Time   COLORURINE RED (A) 08/22/2016 1015   APPEARANCEUR CLOUDY (A) 08/22/2016 1015   LABSPEC 1.009 08/22/2016 1015   PHURINE 6.0 08/22/2016 1015   GLUCOSEU (A) 08/22/2016 1015    TEST NOT REPORTED DUE TO COLOR INTERFERENCE OF URINE PIGMENT   HGBUR (A) 08/22/2016 1015    TEST NOT REPORTED DUE TO COLOR INTERFERENCE OF URINE PIGMENT   BILIRUBINUR (A) 08/22/2016 1015    TEST NOT REPORTED DUE TO COLOR INTERFERENCE OF URINE PIGMENT   KETONESUR (A) 08/22/2016 1015    TEST NOT REPORTED DUE TO COLOR INTERFERENCE OF URINE PIGMENT   PROTEINUR (A) 08/22/2016 1015    TEST NOT REPORTED DUE TO COLOR INTERFERENCE OF URINE PIGMENT   NITRITE (A) 08/22/2016 1015    TEST NOT REPORTED DUE TO COLOR INTERFERENCE OF URINE PIGMENT   LEUKOCYTESUR (A) 08/22/2016 1015    TEST NOT REPORTED DUE TO COLOR INTERFERENCE OF URINE PIGMENT   Radiology Studies: No results found.    Scheduled Meds: . Chlorhexidine Gluconate Cloth  6 each Topical Daily  . feeding supplement (ENSURE ENLIVE)  237 mL Oral BID BM  . folic acid  1 mg Oral Daily  . furosemide  40 mg Oral Daily  . Gerhardt's butt cream   Topical Daily  . hydrALAZINE  50 mg Oral Q8H  . lactulose  30 g Oral Daily  . mouth rinse  15 mL Mouth Rinse q12n4p  . multivitamin  1 tablet Oral Daily  . pantoprazole  40 mg Oral Daily  . rifaximin  550 mg Oral BID  . risperiDONE  0.5 mg Oral BID  . sodium chloride flush  10-40 mL Intracatheter Q12H  . sodium chloride flush  3 mL Intravenous Q12H  . spironolactone  50 mg Oral BID  . thiamine  100 mg Oral Daily   Continuous Infusions: . cefTAZidime (FORTAZ)  IV Stopped (08/27/16 1016)  . dextrose    . magnesium sulfate 1 - 4 g bolus IVPB       LOS: 39 days   Time spent: 25 minutes   Faye Ramsay, MD  [facility] Pager [phone]  If  7PM-7AM, please contact night-coverage [email or web address] Password [facility] 08/27/2016, 4:45 PM

## 2016-08-27 NOTE — Progress Notes (Signed)
Occupational Therapy Treatment Patient Details Name: Sanuel Ladnier MRN: [id number] DOB: 03/06/1970 Today's Date: 08/27/2016    History of present illness Pt admitted 7/13 with sepsis, MSSA bacteremia, B LE celllulitis, tricuspid valve vegetation, R LL cavity PNA with pleural effusion, hep C. Developed encephalopathy and agitation and transferred to ICU 7/17. + DVT LUE. Intubated 8/3-8/13; s/p CT placement 8/5 secondary to effusion. PMH: polysubstance abuse.   OT comments  Pt progressing toward OT goals and has met the majority of original acute goals. See care plan section for goals updated this date. He was able to complete toilet transfers with min assist and multimodal cues for sequencing with RW and stand at sink for grooming tasks with min guard assist. Pt continues to demonstrate decreased safety awareness and poor sequencing skills making him at great risk of falling. Continue to recommend post-acute rehabilitation; however, note most recent case management note. If unable to obtain post-acute rehabilitation with need 24 hour hands on assistance. Will continue to follow per POC.   Follow Up Recommendations  SNF;Supervision/Assistance - 24 hour    Equipment Recommendations  3 in 1 bedside commode;Tub/shower seat    Recommendations for Other Services      Precautions / Restrictions Precautions Precautions: Fall Precaution Comments: Chest tube Restrictions Weight Bearing Restrictions: No       Mobility Bed Mobility Overal bed mobility: Needs Assistance Bed Mobility: Supine to Sit     Supine to sit: Min guard     General bed mobility comments: Min guard for safety.   Transfers Overall transfer level: Needs assistance Equipment used: Rolling walker (2 wheeled) Transfers: Sit to/from Stand Sit to Stand: Min guard         General transfer comment: Min guard assist for safety. Min assist once ambulating.     Balance Overall balance assessment: Needs  assistance Sitting-balance support: No upper extremity supported;Feet supported Sitting balance-Leahy Scale: Fair     Standing balance support: Bilateral upper extremity supported;During functional activity Standing balance-Leahy Scale: Poor Standing balance comment: Pt required UE support during activity                           ADL either performed or assessed with clinical judgement   ADL Overall ADL's : Needs assistance/impaired Eating/Feeding: Set up;Sitting Eating/Feeding Details (indicate cue type and reason): sitting in recliner Grooming: Wash/dry face;Min guard;Standing   Upper Body Bathing: Min guard;Sitting   Lower Body Bathing: Moderate assistance;Sit to/from stand   Upper Body Dressing : Sitting;Min guard   Lower Body Dressing: Moderate assistance;Sit to/from stand   Toilet Transfer: Minimal assistance;Ambulation;RW Toilet Transfer Details (indicate cue type and reason): Consistent VC's to sequence use of RW. Toileting- Clothing Manipulation and Hygiene: Minimal assistance;Sit to/from stand       Functional mobility during ADLs: Minimal assistance;Rolling walker General ADL Comments: Pt highly impulsive with poor safety awareness. Asking therapist to discontinue his fluids despite RN educating pt at beginning of session that IV team must do this with PICC line. Frequently holding RW off the ground and unsafely maneuvering.      Vision   Vision Assessment?: No apparent visual deficits   Perception     Praxis      Cognition Arousal/Alertness: Awake/alert Behavior During Therapy: Impulsive Overall Cognitive Status: Impaired/Different from baseline Area of Impairment: Safety/judgement;Attention;Awareness;Memory;Following commands                 Orientation Level: Disoriented to;Time;Situation Current Attention Level: Selective Memory:  Decreased short-term memory Following Commands: Follows multi-step commands  consistently Safety/Judgement: Decreased awareness of safety;Decreased awareness of deficits Awareness: Anticipatory Problem Solving: Decreased initiation;Difficulty sequencing;Requires verbal cues General Comments: Pt communicating well this session and following multi-step commands. Continues to demonstrate decreased awareness and poor safety. Highly impulsive with use of RW and tipping it over seemingly on purpose when approaching chair.         Exercises     Shoulder Instructions       General Comments      Pertinent Vitals/ Pain       Pain Assessment: Faces Faces Pain Scale: Hurts little more Pain Location: abdomen along with nausea Pain Descriptors / Indicators: Aching;Discomfort;Tightness Pain Intervention(s): Monitored during session;Repositioned  Home Living                                          Prior Functioning/Environment              Frequency  Min 2X/week        Progress Toward Goals  OT Goals(current goals can now be found in the care plan section)  Progress towards OT goals: Goals met and updated - see care plan  Acute Rehab OT Goals Patient Stated Goal: to get his fluids discontinued OT Goal Formulation: Patient unable to participate in goal setting Time For Goal Achievement: 09/03/16 Potential to Achieve Goals: Good ADL Goals Pt Will Perform Upper Body Dressing: with set-up;sitting Pt Will Perform Lower Body Dressing: with supervision;sit to/from stand Pt Will Transfer to Toilet: with supervision;ambulating Pt Will Perform Toileting - Clothing Manipulation and hygiene: with supervision;sit to/from stand Pt Will Perform Tub/Shower Transfer: with supervision;Tub transfer;Shower transfer;shower seat;rolling walker;ambulating Additional ADL Goal #1: Pt will demonstrate alternating attention during morning ADL routine in a moderately distracting environment.   Plan Discharge plan remains appropriate    Co-evaluation                  AM-PAC PT "6 Clicks" Daily Activity     Outcome Measure   Help from another person eating meals?: A Little Help from another person taking care of personal grooming?: A Little Help from another person toileting, which includes using toliet, bedpan, or urinal?: A Little Help from another person bathing (including washing, rinsing, drying)?: A Lot Help from another person to put on and taking off regular upper body clothing?: A Little Help from another person to put on and taking off regular lower body clothing?: A Lot 6 Click Score: 16    End of Session Equipment Utilized During Treatment: Rolling walker  OT Visit Diagnosis: Muscle weakness (generalized) (M62.81);Other symptoms and signs involving cognitive function   Activity Tolerance Patient tolerated treatment well   Patient Left in bed;with call bell/phone within reach;with bed alarm set   Nurse Communication Mobility status        Time: 1607-3710 OT Time Calculation (min): 20 min  Charges: OT General Charges $OT Visit: 1 Procedure OT Treatments $Self Care/Home Management : 8-22 mins  Norman Herrlich, MS OTR/L  Pager: [place] 08/27/2016, 10:17 AM

## 2016-08-27 NOTE — Progress Notes (Signed)
Pt BP was 158/91 given labetalol as PRN and recheck BP 149/91, asymptomatic.

## 2016-08-28 ENCOUNTER — Inpatient Hospital Stay (HOSPITAL_COMMUNITY): Payer: Medicaid Other

## 2016-08-28 LAB — CBC WITH DIFFERENTIAL/PLATELET
BASOS ABS: 0 10*3/uL (ref 0.0–0.1)
BASOS PCT: 1 %
EOS ABS: 0.5 10*3/uL (ref 0.0–0.7)
Eosinophils Relative: 6 %
HCT: 27.6 % — ABNORMAL LOW (ref 39.0–52.0)
Hemoglobin: 8.9 g/dL — ABNORMAL LOW (ref 13.0–17.0)
Lymphocytes Relative: 35 %
Lymphs Abs: 3 10*3/uL (ref 0.7–4.0)
MCH: 28.1 pg (ref 26.0–34.0)
MCHC: 32.2 g/dL (ref 30.0–36.0)
MCV: 87.1 fL (ref 78.0–100.0)
MONO ABS: 0.6 10*3/uL (ref 0.1–1.0)
MONOS PCT: 7 %
NEUTROS ABS: 4.5 10*3/uL (ref 1.7–7.7)
Neutrophils Relative %: 51 %
Platelets: 173 10*3/uL (ref 150–400)
RBC: 3.17 MIL/uL — ABNORMAL LOW (ref 4.22–5.81)
RDW: 18.3 % — AB (ref 11.5–15.5)
WBC: 8.7 10*3/uL (ref 4.0–10.5)

## 2016-08-28 LAB — MAGNESIUM: Magnesium: 2.1 mg/dL (ref 1.7–2.4)

## 2016-08-28 LAB — APTT: APTT: 77 s — AB (ref 24–36)

## 2016-08-28 LAB — PROTIME-INR
INR: 1.19
PROTHROMBIN TIME: 15.1 s (ref 11.4–15.2)

## 2016-08-28 MED ORDER — BOOST / RESOURCE BREEZE PO LIQD
1.0000 | Freq: Two times a day (BID) | ORAL | Status: DC
  Administered 2016-08-29: 1 via ORAL

## 2016-08-28 MED ORDER — SPIRONOLACTONE 50 MG PO TABS
75.0000 mg | ORAL_TABLET | Freq: Two times a day (BID) | ORAL | Status: DC
  Administered 2016-08-28 – 2016-08-29 (×2): 75 mg via ORAL
  Filled 2016-08-28 (×2): qty 1

## 2016-08-28 MED ORDER — FUROSEMIDE 20 MG PO TABS
60.0000 mg | ORAL_TABLET | Freq: Every day | ORAL | Status: DC
  Administered 2016-08-29: 10:00:00 60 mg via ORAL
  Filled 2016-08-28: qty 1

## 2016-08-28 NOTE — Progress Notes (Addendum)
PROGRESS NOTE  Rawad Bochicchio  WNI:[id number] DOB: Sep 22, 1970 DOA: 07/19/2016   PCP: [name], MD   Brief Narrative:  46 yo Male PMHx Hep C and IVDA, Polysubstance Abuse, presented with one week duration of fevers, redness and swelling on the dorsum of the right foot. Pt with ongoing use of opioids, injected Opana 2 days PTA. Pt admitted with right foot cellulitis as well as RLL infiltrate, found to have MSSA bacteremia with possible Meningitis. On 7/17, pt developed acute encephalopathy with agitation, combative requiring precedex drip.   Subjective: Afebrile, denies CP, nausea, vomiting and SOB. Still with elevated output from chest tube.  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 - due to cavitary PNA, septic emboli and aspiration  - PCCM following for effusion and management of chest tube - current chest tube output ~ 300 cc per day  - per PCCM recommendations diuretics dose to escalate more -will follow clinical response  - if we can not control effusion medically, pt may be TIPS candidate as he has clinically improved and has not experience any further hemoptysis  -will follow up PCCM rec's   MSSA and Pseudomonas bacteremia w/ sepsis/Tricuspid valve Endocarditis  - CSF culture negative - ceftazidime changed to 2 g IV every 8 hours, continue through 08/31/2016 as per ID plan. -afebrile and stable.  Hepatitis C - liver cirrhosis  - Patient currently not candidate for treatment secondary to drug abuse - will need outpatient follow up after proving abstinence for 6 months   LUE Radial Vein DVT  - Seen on duplex 7/24.  -No PE on CTA 7/24 -no swelling appreciated   Acute Encephalopathy -resolved now -multifactorial in  etiology -will continue monitoring     Acute Renal Failure  - Cr  3.17 on admission: Secondary to dehydration.  -significantly improved -Cr 1.42 now (last check 8/21) -will continue monitoring intermittently -Advise for adequate hydration   Hypokalemia/hypomagnesemia -stable currently -probably associated with diuretics usage and overall poor nutrition  -will monitor trend and replete further as needed   Hypernatremia -resolved -will monitor trend intermittently   Drug abuse and dependence  -cessation counseling provided -patient considering keeping himself clean  Protein calorie malnutrition -will follow nutritional service rec's -continue feeding supplements   Thrombocytopenia, anemia -stable/slowly improving -will monitor -no signs of acute bleeding   DVT prophylaxis: SCD Code Status: Full Family Communication: no family at bedside Disposition Plan: pt with no insurance so can not go to [facility] or SNF apparently. Will continue IV antibiotics and follow PCCM rec's for further chest tube decisions.  Consultants:  PCCM ID   Procedures/Significant Events:  7/13 - Admit to [facility] 7/17 - Transfer to ICU for precedex infusion 7/21 - Precedex off  7/22 - TRH resumed care 7/24 - venous duplex L UE radial vein DVT 7/25 - PCCM signed off 8/02 - Patient hypothermic & more agitated 8/03 - acute tachypnea w/ "guppy breathing" > emergently intubated w/ bronchoscopy showing "mild diffuse alveolar hemorrhage" 8/05 - Right chest tube inserted w/ 350 mL serous fluid. Bicarb gtt started for acidosis 8/06 - Switched off Cefepime to [place] given potential for worsening encephalopathy  8/08 - Albumin w/ Lasix today. Improving UOP.  8/09 - Albumin w/ Lasix q6hr x3 doses. Precedex started in an attempt to wean  Fentanyl drip.  8/11 - N/V of bilious emesis overnight & continued diuresis 8/12 - Continued diuresis w/ Lasix & added Aldactone daily. Patient apneic during SBT. Decreasing chest  tube output. 8/13 - extubated   RENAL U/S 7/13:   IMPRESSION: 1. Mild right pelvicaliectasis, with ureteral jet noted in the urinary bladder excluding significant ureteral obstruction. 2. Small left renal cyst. 3. Pleural effusions and abdominal ascites. LP 7/13:  Tube 1 - WBC 51 (62% lymph, 29% lymph, 9% mono), RBC 555, Protein 88 , & Glucose 52. / Tube 4 - WBC 180 (58% neutro, 31% lymph, 11% monocytes) & RBC 36. TTE 7/14:  LV normal in size with EF 55-60%. No regional wall motion abnormalities & grade 1 diastolic dysfunction. LA upper limits of normal in size & RA normal in size. RV normal in size and function. No aortic stenosis or regurgitation. Aortic root normal in size. No mitral stenosis or regurgitation. No pulmonic stenosis or regurgitation with poorly visualized valve. No tricuspid regurgitation. Medium sized mobile vegetation noted on tricuspid valve. No pericardial effusion. CT HEAD W/O 7/16:  Progressive atrophy since 2011.  No acute intracranial findings. No abnormal enhancement or vasogenic edema to suggest septic emboli to the brain. VENOUS DUPLEX BILATERAL LOWER EXTREMITY 7/17:  No DVT or SVT.  CTA CHEST 7/24:  Negative for a pulmonary embolism. Extensive pleural-parenchymal lung disease bilaterally. Large right pleural effusion and moderate-sized left pleural effusion. Pleural-based nodular opacities could represent an infectious etiology but also raise concern for a neoplastic process. Concern for a cavitary lesion and necrotic tissue in the right lung. Consider sampling of the pleural fluid. Recommend follow-up CT when the pleural fluid and acute process have resolved to exclude neoplastic disease. Severe emphysematous disease. Cirrhosis with evidence for portal hypertension based on the splenomegaly and ascites. 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  VENOUS DUPLEX LUE 7/24:  Acute DVT involving small portion of left radial vein in mid-forearm.  CT HEAD  W/O 7/26:  Normal head CT. Right Pleural Effusion 7/26:  1.5 L hazy yellow fluid by IR. Protein <3.0, Albumin <1.0, Amylase 39, Glucose 72, LDH 72, & WBC 2431 (11% lymph, 82% neutro, & 7% mono). Cytology negative for malignancy.  MRI BRAIN W/O 8/2:  Exam is limited to diffusion only. Negative for acute infarct. No area of restricted diffusion. EEG 8/2:  This EEG is abnormal due to moderate diffuse slowing of the background. Lingula BAL 8/3:  WBC 78 (39% lymph, 31% neutro, 30% mono). Reported DAH.  TTE 8/3:  LV normal in size with EF 55-60%. No regional wall motion abnormality & grade 1 diastolic dysfunction. LA & RA normal in size. RV normal in size and function. No aortic stenosis or regurgitation. Aortic root normal in size. No mitral stenosis or regurgitation. No pulmonic stenosis. Trivial regurgitation. Trivial pericardial effusion as well. Notably no vegetations were seen on this exam. Right Pleural Effusion 8/5:  WBC 616 (1% lymph, 1% eos, & 98% neutro). PORT CXR 8/5:  Previously reviewed by me. Right-sided predominant opacities. Right-sided chest tube in place. Endotracheal tube in good position. Enteric feeding tube in good position. Right upper extremity PICC line in good position. Complete Abdominal U/S 8/6: Stable cirrhotic changes involving the liver without definite focal hepatic lesion. Mild common bile duct dilatation without obvious common bile duct stone or intrahepatic biliary dilatation. Poor visualization of the pancreas. Increased echogenicity of both kidneys suggesting medical renal disease. No hydronephrosis. Large volume ascites and bilateral  pleural effusions. Port CXR 8/7:  Previously reviewed by me. Hazy opacities bilateral lower lung zones. Endotracheal tube & enteric feeding tube in good position. Right chest tube in good position.  PORT CXR 8/11:  Personally reviewed by me. Patchy hazy opacities bilaterally which are improving. No appreciable residual pleural effusion on the  right. Right-sided chest tube in good position. Endotracheal tube and right upper extremity central venous catheter in good position. Enteric feeding tube coursing below diaphragm. PORT ABD X-RAY 8/11: Paucity of bowel gas without evidence of enteric obstruction. Enteric tube tip and side port project over the expected location of the mid body of the stomach. CT abd / pelv 8/12 > persistent bilateral pleural effusion (though improved), 3cm irregular rounded density in RLL.  Cirrhosis with splenomegaly and ascites, anasarca. Inflammatory vs infectious process involving duodenum  Cultures Blood Cultures x2 7/12:  2/2 Bottles MSSA & 1/2 Bottles Pseudomonas aeruginosa  CSF Culture 7/13:  Negative  MRSA PCR 7/13:  Negative HIV 7/13:  Negative Respiratory Viral Panel PCR 7/13:  Negative  Urine Culture 7/13:  Multiple Species Present Blood Cultures x2 7/14:  1/2 Bottles MSSA Blood Culture x1 7/15:  Negative  MRSA PCR 7/17:  Negative  Urine Culture 7/17:  Negative  Urine Streptococcal Antigen 7/17:  Negative Urine Legionella Antigen 7/17:  Negative  Right Pleural Fluid Culture 7/26 >>> Bacteria Negative / AFB pending / Fungus pending Lingula BAL 8/3:  Candida tropicalis  Blood Cultures x2 8/3:  Negative  Right Pleural Culture 8/5:  Negative  Blood Culture x2 8/6:  Negative   Antimicrobials: Anti-infectives    Start     Stop   08/22/16 1100  rifaximin (XIFAXAN) tablet 550 mg         08/19/16 1000  cefTAZidime (FORTAZ) 1 g in dextrose 5 % 50 mL IVPB     08/31/16 2359   08/12/16 1100  cefTAZidime (FORTAZ) 2 g in dextrose 5 % 50 mL IVPB  Status:  Discontinued     08/19/16 0555   08/11/16 1000  ceFEPIme (MAXIPIME) 1 g in dextrose 5 % 50 mL IVPB  Status:  Discontinued     08/12/16 0936   08/10/16 1500  vancomycin (VANCOCIN) 1,250 mg in sodium chloride 0.9 % 250 mL IVPB  Status:  Discontinued     08/11/16 0930   08/10/16 1000  ceFEPIme (MAXIPIME) 2 g in dextrose 5 % 50 mL IVPB  Status:   Discontinued     08/11/16 0930   08/09/16 1300  vancomycin (VANCOCIN) 1,500 mg in sodium chloride 0.9 % 500 mL IVPB     08/09/16 1602   07/22/16 1400  ceFEPIme (MAXIPIME) 2 g in dextrose 5 % 50 mL IVPB  Status:  Discontinued     08/09/16 1226   07/20/16 1500  ceFEPIme (MAXIPIME) 2 g in dextrose 5 % 50 mL IVPB  Status:  Discontinued     07/22/16 1122   07/19/16 2300  vancomycin (VANCOCIN) IVPB 750 mg/150 ml premix  Status:  Discontinued     07/19/16 0953   07/19/16 2200  ceFEPIme (MAXIPIME) 2 g in dextrose 5 % 50 mL IVPB  Status:  Discontinued     07/20/16 1410   07/19/16 2000  nafcillin 2 g in dextrose 5 % 100 mL IVPB  Status:  Discontinued     07/22/16 1130   07/19/16 1915  nafcillin injection 2 g  Status:  Discontinued     07/19/16 1915   07/19/16 1800  ceFEPIme (MAXIPIME)  2 g in dextrose 5 % 50 mL IVPB  Status:  Discontinued     07/19/16 1912   07/19/16 1100  vancomycin (VANCOCIN) 500 mg in sodium chloride 0.9 % 100 mL IVPB  Status:  Discontinued     07/19/16 1645   07/19/16 0800  piperacillin-tazobactam (ZOSYN) IVPB 3.375 g  Status:  Discontinued     07/19/16 1645   07/19/16 0200  cefTRIAXone (ROCEPHIN) 2 g in dextrose 5 % 50 mL IVPB     07/19/16 0259   07/19/16 0130  cefTRIAXone (ROCEPHIN) 1 g in dextrose 5 % 50 mL IVPB  Status:  Discontinued     07/19/16 0140   07/19/16 0115  piperacillin-tazobactam (ZOSYN) IVPB 3.375 g     07/19/16 0219   07/19/16 0115  vancomycin (VANCOCIN) IVPB 1000 mg/200 mL premix     07/19/16 0243     LINES / TUBES:  OETT 8/3 >>>8/13 Rt CHEST TUBE 8/5 >>> RUE DL PICC 7/27 >>> Foley >>>  Continuous Infusions: . cefTAZidime (FORTAZ)  IV Stopped (08/28/16 1722)  . dextrose     Objective: Vitals:   08/27/16 2112 08/28/16 0542 08/28/16 0610 08/28/16 1557  BP: (!) 142/97 (!) 149/81  (!) 156/81  Pulse: 87 72  86  Resp: _0 Temp: 98 F (36.7 C) 98 F (36.7 C)  97.6 F (36.4 C)  TempSrc: Oral Oral  Oral  SpO2: 98% 98%  100%  Weight:    72 kg (158 lb 11.7 oz)   Height:        Intake/Output Summary (Last 24 hours) at 08/28/16 1804 Last data filed at 08/28/16 1652  Gross per 24 hour  Intake             1432 ml  Output              770 ml  Net              662 ml   Filed Weights   08/25/16 0500 08/26/16 0500 08/28/16 0610  Weight: 73.4 kg (161 lb 13.1 oz) 72.9 kg (160 lb 11.5 oz) 72 kg (158 lb 11.7 oz)   Physical Exam  Constitutional: afebrile, no CP, no nausea, no vomiting. Patient reports breathing better overall. Still with approx 330-320 chest tube output.   CVS: RRR, no rubs, no gallops, no murmur appreciated.  Pulmonary: normal effort, decrease breath sound on right side; no wheezing, no crackles.  Abdominal: soft, NT, ND, positive BS; no guarding.  Musculoskeletal: FROM, no joint swelling.   Psychiatric: oriented X3; stable mood, overall with good insight.    Data Reviewed: I have reviewed patient's blood work today, vital signs, discussed with patient at bedside  CBC:  Recent Labs Lab 08/23/16 0431 08/25/16 0355 08/26/16 0426 08/27/16 0434 08/28/16 0446  WBC 9.7 8.3 7.2 7.6 8.7  NEUTROABS 6.8 5.5 4.5 4.0 4.5  HGB 7.6* 7.9* 8.1* 7.9* 8.9*  HCT 24.8* 25.3* 25.2* 24.6* 27.6*  MCV 91.2 88.5 88.7 87.2 87.1  PLT 85* 105* 117* 130* 889   Basic Metabolic Panel:  Recent Labs Lab 08/24/16 1030 08/25/16 0355 08/25/16 1714 08/26/16 0426 08/26/16 1758 08/27/16 0434 08/27/16 1623 08/28/16 0446  NA 139 135  136 135  136 138 138 139 139  --   K 3.2* 3.0*  3.0* 3.3*  3.3* 3.6 4.1 4.2 4.3  --   CL 106 103  105 104  105 108 109 112* 112*  --   CO2 27  _0 --   GLUCOSE 98 100*  104* 87  89 81 80 75 88  --   BUN 23* _1 --   CREATININE 1.88* 1.76*  1.76* 1.65*  1.63* 1.54* 1.51* 1.51* 1.42*  --   CALCIUM 7.3* 7.2*  7.3* 7.4*  7.3* 7.3* 7.5* 7.4* 7.7*  --   MG 1.7 1.4*  --  1.8  --  1.4*  --  2.1  PHOS 3.3 3.3 3.3 3.5 3.3 3.4 3.6  --      Liver Function Tests:  Recent Labs Lab 08/25/16 1714 08/26/16 0426 08/26/16 1758 08/27/16 0434 08/27/16 1623  ALBUMIN 1.4* 1.4* 1.4* 1.4* 1.5*   Coagulation Profile:  Recent Labs Lab 08/23/16 0431 08/25/16 0355 08/26/16 0426 08/27/16 0434 08/28/16 0446  INR 1.43 1.26 1.27 1.32 1.19   CBG:  Recent Labs Lab 08/22/16 0329 08/22/16 0750 08/22/16 1148 08/22/16 1612 08/22/16 2017  GLUCAP 103* 106* 115* 110* 131*   Urine analysis:    Component Value Date/Time   COLORURINE RED (A) 08/22/2016 1015   APPEARANCEUR CLOUDY (A) 08/22/2016 1015   LABSPEC 1.009 08/22/2016 1015   PHURINE 6.0 08/22/2016 1015   GLUCOSEU (A) 08/22/2016 1015    TEST NOT REPORTED DUE TO COLOR INTERFERENCE OF URINE PIGMENT   HGBUR (A) 08/22/2016 1015    TEST NOT REPORTED DUE TO COLOR INTERFERENCE OF URINE PIGMENT   BILIRUBINUR (A) 08/22/2016 1015    TEST NOT REPORTED DUE TO COLOR INTERFERENCE OF URINE PIGMENT   KETONESUR (A) 08/22/2016 1015    TEST NOT REPORTED DUE TO COLOR INTERFERENCE OF URINE PIGMENT   PROTEINUR (A) 08/22/2016 1015    TEST NOT REPORTED DUE TO COLOR INTERFERENCE OF URINE PIGMENT   NITRITE (A) 08/22/2016 1015    TEST NOT REPORTED DUE TO COLOR INTERFERENCE OF URINE PIGMENT   LEUKOCYTESUR (A) 08/22/2016 1015    TEST NOT REPORTED DUE TO COLOR INTERFERENCE OF URINE PIGMENT   Radiology Studies: Dg Chest Port 1 View  Result Date: 08/28/2016 CLINICAL DATA:  Pleural effusion.  Chest tube. EXAM: PORTABLE CHEST 1 VIEW COMPARISON:  08/23/2016. FINDINGS: Right PICC line and right chest tube in stable position. Tiny right apical pneumothorax on today's exam cannot be excluded. Heart size stable. COPD. Stable mild bibasilar atelectasis. IMPRESSION: 1. Right PICC line and right chest tube stable position. Tiny right apical pneumothorax cannot be excluded on today's exam. 2. Persistent mild bibasilar atelectasis. Small right pleural effusion cannot be excluded. Critical Value/emergent  results were called by telephone at the time of interpretation on 08/28/2016 at 7:57 am to nurse Remo Lipps , who verbally acknowledged these results. Electronically Signed   By: Marcello Moores  Register   On: 08/28/2016 08:01    Scheduled Meds: . Chlorhexidine Gluconate Cloth  6 each Topical Daily  . feeding supplement  1 Container Oral BID BM  . folic acid  1 mg Oral Daily  . [START ON 08/29/2016] furosemide  60 mg Oral Daily  . Gerhardt's butt cream   Topical Daily  . hydrALAZINE  50 mg Oral Q8H  . lactulose  30 g Oral Daily  . mouth rinse  15 mL Mouth Rinse q12n4p  . multivitamin  1 tablet Oral Daily  . pantoprazole  40 mg Oral Daily  . rifaximin  550 mg Oral BID  . risperiDONE  0.5 mg Oral BID  . sodium chloride flush  10-40  mL Intracatheter Q12H  . sodium chloride flush  3 mL Intravenous Q12H  . spironolactone  75 mg Oral BID  . thiamine  100 mg Oral Daily   Continuous Infusions: . cefTAZidime (FORTAZ)  IV Stopped (08/28/16 1722)  . dextrose       LOS: 40 days   Time spent: 25 minutes   Barton Dubois MD   [facility] Pager [phone]  If 7PM-7AM, please contact night-coverage [email or web address] Password [facility] 08/28/2016, 6:04 PM

## 2016-08-28 NOTE — Progress Notes (Signed)
Nutrition Follow-up  DOCUMENTATION CODES:   Non-severe (moderate) malnutrition in context of chronic illness  INTERVENTION:   -D/c Ensure Enlive po BID, each supplement provides 350 kcal and 20 grams of protein, due to poor acceptance -Continue MVI daily -Boost Breeze po BID, each supplement provides 250 kcal and 9 grams of protein  NUTRITION DIAGNOSIS:   Malnutrition (Moderate) related to chronic illness (polysubstance abuse) as evidenced by moderate depletion of body fat, moderate depletions of muscle mass, moderate to severe fluid accumulation.  Ongoing  GOAL:   Patient will meet greater than or equal to 90% of their needs  Progressing  MONITOR:   PO intake, Supplement acceptance, Labs, Weight trends, Skin, I & O's  REASON FOR ASSESSMENT:   Consult Enteral/tube feeding initiation and management  ASSESSMENT:   46 yo male with PMH of substance abuse, tobacco use, IV drug abuse, who was admitted on 7/13 with MSSA bacteremia, endocarditis, PNA, cellulitis of RLE.  8/16- transferred from ICU to surgical floor 8/17- pt refusing cortrak tube placement and medications 8/18- pt again refusing cortrak tube, advanced to a regular diet  Pt sleeping soundly at time of visit; RD did not disturb.   Meal completion 75-100%. Pt is refusing Ensure Enlive supplements, stating "they taste nasty".   Noted 4.2% wt loss over the past week. +0.6 L this week and -16 L since admission.   Reviewed RNCM note from 08/26/16; pt is not an LTACH or home health candidate, due to uninsured status. IV antibiotics to be completed on 08/21/16.   Labs reviewed.   Diet Order:  Diet regular Room service appropriate? Yes; Fluid consistency: Thin  Skin:  Reviewed, no issues  Last BM:  08/28/16  Height:   Ht Readings from Last 1 Encounters:  08/18/16 5\' 11"  (1.803 m)    Weight:   Wt Readings from Last 1 Encounters:  08/28/16 158 lb 11.7 oz (72 kg)    Ideal Body Weight:  78.2 kg  BMI:   Body mass index is 22.14 kg/m.  Estimated Nutritional Needs:   Kcal:  2100-2300  Protein:  90-110 gm  Fluid:  2.1-2.3 L  EDUCATION NEEDS:   No education needs identified at this time  Jazlyn Tippens A. Mayford Knife, RD, LDN, CDE Pager: [phone] After hours Pager: [phone]

## 2016-08-28 NOTE — Progress Notes (Signed)
  Speech Language Pathology Treatment: Dysphagia  Patient Details Name: Johnathan [name] MRN: [id number] DOB: [birth year] Today's Date: 08/28/2016 Time: 4098-1191 SLP Time Calculation (min) (ACUTE ONLY): 8 min  Assessment / Plan / Recommendation Clinical Impression  SLP followed up for diet tolerance and adherence to safe swallow strategies. Straws noted at bedside, which were previously not recommended by prior treating SLP as difficulty observed with usage.  Reviewed increased risk for reduced airway protection with pt and staff. Pt states he has not objection to drinking from a cup. SLP observed pt consumed three ounce water challenge without any s/sx of aspiration. Solid PO trials declined by the pt this date as breakfast had just been completed. Nursing continues to report good tolerance of current diet. Continue regular thin diet with no straws. No further ST needs identified. Intermittent supervision recommended during PO consumption to reduce aspiration risk.    HPI HPI: Ptis a 46 y.o.male admitted with sepsis secondary to UTI vs cellulitis and ARF and hyponatremia. PMH:w Hep C?, IVDA, (recently injected opana), c/o fever intermittently and redness over the dorsum of the right foot and slight swelling. Per chart pt notes injecting Opana about 2 days prior to admit. Pt seen for BSE 7/13 without indications of oropharyngeal dysphagia; shallow respiratory pattern; regular diet/thin recommended and signed off. Transferred to ICU for Precedex due to ETOH withdrawal 7/17. Developed right cavitary PNA from septic emboli with right pleural effusion. Pt was reassessed 7/28 with recommendations to remain NPO due to mentation. He became somewhat more alert and was on a pureed diet and thin liquids until he needed to be intubated 8/3-8/13.       SLP Plan  All goals met       Recommendations  Diet recommendations: Regular;Thin liquid Liquids provided via: No straw;Cup Medication  Administration: Whole meds with liquid Supervision: Patient able to self feed;Intermittent supervision to cue for compensatory strategies Compensations: Slow rate;Small sips/bites;Follow solids with liquid Postural Changes and/or Swallow Maneuvers: Seated upright 90 degrees                Oral Care Recommendations: Oral care BID Follow up Recommendations: 24 hour supervision/assistance SLP Visit Diagnosis: Dysphagia, unspecified (R13.10) Plan: All goals met       GO               Arvil Chaco MA, CCC-SLP Acute Care Speech Language Pathologist    Levi Aland 08/28/2016, 9:30 AM

## 2016-08-28 NOTE — Progress Notes (Signed)
PULMONARY / CRITICAL CARE MEDICINE CONSULT   Name: Johnathan [name] MRN: [id number] DOB: 1970-04-07    ADMISSION DATE:  07/19/2016 CONSULTATION DATE:  07/23/2016  REFERRING MD:  Ree Kida  HISTORY OF PRESENT ILLNESS:  46 y.o. malew Hep C?, IVDA, (recently injected opana) apparently c/o fever intermittently for  1 week, and redness over the dorsum of the right foot and slight swelling. Pt notes injecting Opana about 2 days ago and cellulitis in right foot noted on admission 7/13 with labs showing EKG and abnormal LFTs with hyponatremia 123 and right lower lobe infiltrate on chest x-ray. Found to have MSSA bacteremia, possible meningitis, and LE cellulitis.  Echocardiogram Showed EF 41-66%, grade 1 diastolic dysfunction, tricuspid valve with mobile vegetation. On 7/17 He developed acute encephalopathy with agitation, becoming belligerent and combative. He denies ETOH abuse, but admitted to IVDA. He was placed on CIWA protocol, however, scores remained 19-23, patient was not responsive  to ativan, transferred to ICU for precedex gtt   SUBJECTIVE:  Feeling better.  Denies SOB.  OOB in chair.  No c/o.   VITAL SIGNS: BP (!) 149/81 (BP Location: Left Arm)   Pulse 72   Temp 98 F (36.7 C) (Oral)   Resp 18   Ht _0  (1.803 m)   Wt 72 kg (158 lb 11.7 oz)   SpO2 98%   BMI 22.14 kg/m    INTAKE / OUTPUT: I/O last 3 completed shifts: In: 0630 [P.O.:1062; I.V.:303; IV Piggyback:200] Out: 620 [Chest Tube:620]  PHYSICAL EXAMINATION: General appearance:  46 Year old  Male, NAD up in chair  Eyes: anicteric sclerae, moist conjunctivae; PERRL, EOMI bilaterally. Mouth:  membranes and no mucosal ulcerations; normal hard and soft palate Lungs/chest: resps even non labored on RA, R chest tube with serous drainage, no airleak. 376m out last 24h CV: RRR, no MRGs  Abdomen: Soft, non-tender; no masses or HSM Extremities: No peripheral edema or extremity lymphadenopathy Skin: Normal temperature,  turgor and texture; no rash, ulcers or subcutaneous nodules Psych: Appropriate affect, alert and oriented to person, place and time  LABS:  BMET  Recent Labs Lab 08/26/16 1758 08/27/16 0434 08/27/16 1623  NA 138 139 139  K 4.1 4.2 4.3  CL 109 112* 112*  CO2 _1 BUN _2 CREATININE 1.51* 1.51* 1.42*  GLUCOSE 80 75 88    Electrolytes  Recent Labs Lab 08/26/16 0426 08/26/16 1758 08/27/16 0434 08/27/16 1623 08/28/16 0446  CALCIUM 7.3* 7.5* 7.4* 7.7*  --   MG 1.8  --  1.4*  --  2.1  PHOS 3.5 3.3 3.4 3.6  --     CBC  Recent Labs Lab 08/26/16 0426 08/27/16 0434 08/28/16 0446  WBC 7.2 7.6 8.7  HGB 8.1* 7.9* 8.9*  HCT 25.2* 24.6* 27.6*  PLT 117* 130* 173    Coag's  Recent Labs Lab 08/26/16 0426 08/27/16 0434 08/28/16 0446  APTT 84* 70* 77*  INR 1.27 1.32 1.19    Sepsis Markers No results for input(s): LATICACIDVEN, PROCALCITON, O2SATVEN in the last 168 hours.  ABG No results for input(s): PHART, PCO2ART, PO2ART in the last 168 hours.  Liver Enzymes  Recent Labs Lab 08/26/16 1758 08/27/16 0434 08/27/16 1623  ALBUMIN 1.4* 1.4* 1.5*    Cardiac Enzymes No results for input(s): TROPONINI, PROBNP in the last 168 hours.  Glucose  Recent Labs Lab 08/22/16 0050 08/22/16 0329 08/22/16 0750 08/22/16 1148 08/22/16 1612 08/22/16 2017  GLUCAP 103* 103* 106* 115* 110* 131*  Imaging Dg Chest Port 1 View  Result Date: 08/28/2016 CLINICAL DATA:  Pleural effusion.  Chest tube. EXAM: PORTABLE CHEST 1 VIEW COMPARISON:  08/23/2016. FINDINGS: Right PICC line and right chest tube in stable position. Tiny right apical pneumothorax on today's exam cannot be excluded. Heart size stable. COPD. Stable mild bibasilar atelectasis. IMPRESSION: 1. Right PICC line and right chest tube stable position. Tiny right apical pneumothorax cannot be excluded on today's exam. 2. Persistent mild bibasilar atelectasis. Small right pleural effusion cannot be  excluded. Critical Value/emergent results were called by telephone at the time of interpretation on 08/28/2016 at 7:57 am to nurse Remo Lipps , who verbally acknowledged these results. Electronically Signed   By: Marcello Moores  Register   On: 08/28/2016 08:01    STUDIES:  RENAL U/S 7/13:   IMPRESSION: 1. Mild right pelvicaliectasis, with ureteral jet noted in the urinary bladder excluding significant ureteral obstruction. 2. Small left renal cyst. 3. Pleural effusions and abdominal ascites. LP 7/13:  Tube 1 - WBC 51 (62% lymph, 29% lymph, 9% mono), RBC 555, Protein 88 , & Glucose 52. / Tube 4 - WBC 180 (58% neutro, 31% lymph, 11% monocytes) & RBC 36. TTE 7/14:  LV normal in size with EF 55-60%. No regional wall motion abnormalities & grade 1 diastolic dysfunction. LA upper limits of normal in size & RA normal in size. RV normal in size and function. No aortic stenosis or regurgitation. Aortic root normal in size. No mitral stenosis or regurgitation. No pulmonic stenosis or regurgitation with poorly visualized valve. No tricuspid regurgitation. Medium sized mobile vegetation noted on tricuspid valve. No pericardial effusion. CT HEAD W/O 7/16:  Progressive atrophy since 2011.  No acute intracranial findings. No abnormal enhancement or vasogenic edema to suggest septic emboli to the brain. VENOUS DUPLEX BILATERAL LOWER EXTREMITY 7/17:  No DVT or SVT.  CTA CHEST 7/24:   IMPRESSION: 1. Negative for a pulmonary embolism. 2. Extensive pleural-parenchymal lung disease bilaterally. Large right pleural effusion and moderate-sized left pleural effusion. Pleural-based nodular opacities could represent an infectious etiology but also raise concern for a neoplastic process. Concern for a cavitary lesion and necrotic tissue in the right lung. Consider sampling of the pleural fluid. Recommend follow-up CT when the pleural fluid and acute process have resolved to exclude neoplastic disease. 3. Severe emphysematous  disease. 4. Cirrhosis with evidence for portal hypertension based on the splenomegaly and ascites. 5. 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 Recommend annual imaging followup by CTA or MRA. This recommendation follows 2010 ACCF/AHA/AATS/ACR/ASA/SCA/SCAI/SIR/STS/SVM Guidelines for the Diagnosis and Management of Patients with Thoracic Aortic Disease. Circulation. 2010; 121: M034-J179 VENOUS DUPLEX LUE 7/24:  Acute DVT involving small portion of left radial vein in mid-forearm.  CT HEAD W/O 7/26:  Normal head CT. Right Pleural Effusion 7/26:  1.5 L hazy yellow fluid by IR. Protein <3.0, Albumin <1.0, Amylase 39, Glucose 72, LDH 72, & WBC 2431 (11% lymph, 82% neutro, & 7% mono). Cytology negative for malignancy.  MRI BRAIN W/O 8/2:  Exam is limited to diffusion only. Negative for acute infarct. No area of restricted diffusion. EEG 8/2:  This EEG is abnormal due to moderate diffuse slowing of the background. Lingula BAL 8/3:  WBC 78 (39% lymph, 31% neutro, 30% mono). Reported DAH.  TTE 8/3:  LV normal in size with EF 55-60%. No regional wall motion abnormality & grade 1 diastolic dysfunction. LA & RA normal in size. RV normal in size and function. No aortic  stenosis or regurgitation. Aortic root normal in size. No mitral stenosis or regurgitation. No pulmonic stenosis. Trivial regurgitation. Trivial pericardial effusion as well. Notably no vegetations were seen on this exam. Right Pleural Effusion 8/5:  WBC 616 (1% lymph, 1% eos, & 98% neutro). PORT CXR 8/5:  Previously reviewed by me. Right-sided predominant opacities. Right-sided chest tube in place. Endotracheal tube in good position. Enteric feeding tube in good position. Right upper extremity PICC line in good position. Complete Abdominal U/S 8/6: IMPRESSION: 1. Stable cirrhotic changes involving the liver without definite focal hepatic lesion. 2. Mild common bile duct dilatation without obvious common bile duct stone or  intrahepatic biliary dilatation. 3. Poor visualization of the pancreas. 4. Increased echogenicity of both kidneys suggesting medical renal disease. No hydronephrosis. 5. Large volume ascites and bilateral pleural effusions. Port CXR 8/7:  Previously reviewed by me. Hazy opacities bilateral lower lung zones. Endotracheal tube & enteric feeding tube in good position. Right chest tube in good position.  PORT CXR 8/11:  Personally reviewed by me. Patchy hazy opacities bilaterally which are improving. No appreciable residual pleural effusion on the right. Right-sided chest tube in good position. Endotracheal tube and right upper extremity central venous catheter in good position. Enteric feeding tube coursing below diaphragm. PORT ABD X-RAY 8/11: IMPRESSION: 1. Paucity of bowel gas without evidence of enteric obstruction. 2. Enteric tube tip and side port project over the expected location of the mid body of the stomach. CT abd / pelv 8/12 > persistent bilateral pleural effusion (though improved), 3cm irregular rounded density in RLL.  Cirrhosis with splenomegaly and ascites, anasarca. Inflammatory vs infectious process involving duodenum.  MICROBIOLOGY: Blood Cultures x2 7/12:  2/2 Bottles MSSA & 1/2 Bottles Pseudomonas aeruginosa  CSF Culture 7/13:  Negative  MRSA PCR 7/13:  Negative HIV 7/13:  Negative Respiratory Viral Panel PCR 7/13:  Negative  Urine Culture 7/13:  Multiple Species Present Blood Cultures x2 7/14:  1/2 Bottles MSSA Blood Culture x1 7/15:  Negative  MRSA PCR 7/17:  Negative  Urine Culture 7/17:  Negative  Urine Streptococcal Antigen 7/17:  Negative Urine Legionella Antigen 7/17:  Negative  Right Pleural Fluid Culture 7/26 >>> Bacteria Negative / AFB pending / Fungus pending Lingula BAL 8/3:  Candida tropicalis  Blood Cultures x2 8/3:  Negative  Right Pleural Culture 8/5:  Negative  Blood Culture x2 8/6:  Negative   ANTIBIOTICS: Ceftriaxone 7/13 (x1 dose) Zosyn 7/13 (x1  dose) Nafcillin 7/13 - 7/16 Cefepime 7/13 - 8/6 Vancomycin 7/13 (x1 dose); restarted 8/3 - 8/7 Tressie Ellis 8/6 >>> (plan to continue through 8/25 per ID recs)  SIGNIFICANT EVENTS: 7/17 - Transfer to ICU for precedex infusion 7/21 - Precedex off  7/25 - PCCM signed off 8/02 - Patient hypothermic & more agitated 8/03 - acute tachypnea w/ "guppy breathing" >> emergently intubated w/ bronchoscopy showing "mild diffuse alveolar hemorrhage" 8/05 - Right chest tube inserted w/ 350 mL serous fluid. Bicarb gtt started for acidosis 8/06 - Switched off Cefepime to [place] given potential for worsening encephalopathy  8/08 - Albumin w/ Lasix today. Improving UOP.  8/09 - Albumin w/ Lasix q6hr x3 doses. Precedex started in an attempt to wean Fentanyl drip.  8/11 - N/V of bilious emesis overnight & continued diuresis 8/12 - Continued diuresis w/ Lasix & added Aldactone daily. Patient apneic during SBT. Decreasing chest tube output. 8/16 diuretics backed down d/t hypernatremia   LINES/TUBES: OETT 8/3 >>>8/13 R CHEST TUBE 8/5 >>> RUE DL PICC 7/27 >>> Foley >>>  ASSESSMENT / PLAN: Treated Issues:  Acute hypoxic respiratory failure - Multifactorial.  Extubated 8/13 and tolerated well. Hemoptysis - Likely due to cavitary pneumonia. Improving. No bright red blood. 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  MSSA & Pseudomonas Bacteremia/Endocarditis LUE Radial Vein DVT Residual ALI  Right Lower Extremity Cellulitis - Resolved.  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 Currently on fortaz through 8/25 per ID Transudative Right Pleural Effusion - S/P Chest tube placement. 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 >still with considerable CT output but slowing with additional diuresis.  Lasix added and spironolactone increased 8/21.   Plan continue lasix 40 mg/d Continue spironolactone at 50 bid Repeat chem in am Close obs of CT output-->Ideally want to see < 250m/24 hrs Given his significant improvement clinically - ?would he be a TIPS candidate if we can't control his effusion   KNickolas Madrid NP 08/28/2016  12:04 PM Pager: [phone] or [phone]  Attending Note:  I have examined patient, reviewed labs, studies and notes. I have discussed the case with KShon Millet and I agree with the data and plans as amended above. 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 He had a R chest tube placed when he was critically ill. Now it appears to be functioning as a chronic high volume drain for transudate due to his liver disease. Yesterday we increased his spironolactone and lasix with a decrease in chest tube output to 370cc over 24h. On my eval he is comfortable, up to chair. Decreased R breath sounds. No chest tube site pain. Heart regular. MS appropriate. I will increase the aldactone and lasix again today, follow renal fxn with this change. If we can get chest tube output down further then favor d/c chest tube this week,.   RBaltazar Apo MD, PhD 08/28/2016, 12:50 PM [facility] [phone] if no answer [phone]

## 2016-08-29 DIAGNOSIS — R188 Other ascites: Secondary | ICD-10-CM

## 2016-08-29 DIAGNOSIS — B1710 Acute hepatitis C without hepatic coma: Secondary | ICD-10-CM

## 2016-08-29 DIAGNOSIS — N183 Chronic kidney disease, stage 3 (moderate): Secondary | ICD-10-CM

## 2016-08-29 DIAGNOSIS — B1920 Unspecified viral hepatitis C without hepatic coma: Secondary | ICD-10-CM

## 2016-08-29 LAB — CBC WITH DIFFERENTIAL/PLATELET
BASOS ABS: 0 10*3/uL (ref 0.0–0.1)
BASOS PCT: 1 %
EOS ABS: 0.3 10*3/uL (ref 0.0–0.7)
EOS PCT: 6 %
HCT: 25.1 % — ABNORMAL LOW (ref 39.0–52.0)
Hemoglobin: 8 g/dL — ABNORMAL LOW (ref 13.0–17.0)
LYMPHS ABS: 2.3 10*3/uL (ref 0.7–4.0)
Lymphocytes Relative: 38 %
MCH: 27.6 pg (ref 26.0–34.0)
MCHC: 31.9 g/dL (ref 30.0–36.0)
MCV: 86.6 fL (ref 78.0–100.0)
Monocytes Absolute: 0.5 10*3/uL (ref 0.1–1.0)
Monocytes Relative: 8 %
Neutro Abs: 2.9 10*3/uL (ref 1.7–7.7)
Neutrophils Relative %: 49 %
PLATELETS: 157 10*3/uL (ref 150–400)
RBC: 2.9 MIL/uL — AB (ref 4.22–5.81)
RDW: 18.1 % — ABNORMAL HIGH (ref 11.5–15.5)
WBC: 6 10*3/uL (ref 4.0–10.5)

## 2016-08-29 LAB — BASIC METABOLIC PANEL
Anion gap: 5 (ref 5–15)
BUN: 12 mg/dL (ref 6–20)
CALCIUM: 7.7 mg/dL — AB (ref 8.9–10.3)
CHLORIDE: 109 mmol/L (ref 101–111)
CO2: 22 mmol/L (ref 22–32)
CREATININE: 1.41 mg/dL — AB (ref 0.61–1.24)
GFR calc non Af Amer: 59 mL/min — ABNORMAL LOW (ref >60)
GLUCOSE: 84 mg/dL (ref 65–99)
Potassium: 4 mmol/L (ref 3.5–5.1)
Sodium: 136 mmol/L (ref 135–145)

## 2016-08-29 LAB — APTT: APTT: 60 s — AB (ref 24–36)

## 2016-08-29 LAB — PROTIME-INR
INR: 1.37
Prothrombin Time: 16.9 seconds — ABNORMAL HIGH (ref 11.4–15.2)

## 2016-08-29 LAB — MAGNESIUM: Magnesium: 1.7 mg/dL (ref 1.7–2.4)

## 2016-08-29 MED ORDER — SPIRONOLACTONE 25 MG PO TABS
125.0000 mg | ORAL_TABLET | Freq: Two times a day (BID) | ORAL | Status: DC
  Administered 2016-08-29: 125 mg via ORAL
  Filled 2016-08-29: qty 1

## 2016-08-29 MED ORDER — FUROSEMIDE 40 MG PO TABS
50.0000 mg | ORAL_TABLET | Freq: Two times a day (BID) | ORAL | Status: DC
  Administered 2016-08-29: 50 mg via ORAL
  Filled 2016-08-29: qty 1

## 2016-08-29 NOTE — Progress Notes (Signed)
Pharmacy Antibiotic Note  Cobey Raineri is a 46 y.o. male admitted on 07/19/2016 with MSSA + Pseudomonas bacteremia with TTE (+) tricuspid valve IE.Marland Kitchen  Pharmacy has been consulted for Ceftazidime dosing, currently renally adjusted, through 8/25.  Cr continues to improve, current crcl ~ 65-70 ml/min.  Pt is afebrile and WBC has corrected.  Plan: Ceftazidime to 2g IV q8, continue through 8/25 as per ID plan   Height: _0  (180.3 cm) Weight: 158 lb 4.6 oz (71.8 kg) IBW/kg (Calculated) : 75.3  Temp (24hrs), Avg:97.9 F (36.6 C), Min:97.6 F (36.4 C), Max:98.1 F (36.7 C)   Recent Labs Lab 08/25/16 0355  08/26/16 0426 08/26/16 1758 08/27/16 0434 08/27/16 1623 08/28/16 0446 08/29/16 0430  WBC 8.3  --  7.2  --  7.6  --  8.7 6.0  CREATININE 1.76*  1.76*  < > 1.54* 1.51* 1.51* 1.42*  --  1.41*  < > = values in this interval not displayed.  Estimated Creatinine Clearance: 67.2 mL/min (A) (by C-G formula based on SCr of 1.41 mg/dL (H)).    Allergies  Allergen Reactions  . Ativan [Lorazepam]     Paradoxical Reaction    Zosyn 7/13 >> 7/13 Vanc 7/13 >> 7/13; 8/3 >>8/7 Cefepime 7/13 >>8/6 Nafcillin 7/13 >> 7/16 Ceftazidime 8/6 >> [8/25]  7/12 BCx - MSSA + PSA (pan sensitive) 7/13 CSF cx - negative 7/13 RVP- negative 7/13 MRSA PCR - negative 7/13 UCx - negative 7/14 BCx - 1/2 MSSA 7/15 BCx - neg 7/17 Urine- neg 7/26 pleural fluid- gram stain neg, cx ngF 7/26 AFB- neg 7/26 Fungal: pending...  8/3 blood cx: no growth x 3 days 8/3 BAL: candida tropicalis (contaminant?)  8/3 Blood - negative 8/6 body fluid cx >> negative 8/6 blood cx>> negative  Thank you for allowing pharmacy to be a part of this patient's care.  Maryanna Shape, PharmD, BCPS  Clinical Pharmacist  Pager: [phone]

## 2016-08-29 NOTE — Consult Note (Signed)
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  Status post I&D right elbow abscess in 2016.   HPI: Johnathan [name] is a 46 y.o. male.  PMH polysubstance abuse, IVDA. Hep C diagnosed  as far back as 09/2014 when he was briefly admitted with cellulitis/abscesses.  He was told within the last 18 months by the [facility] office that he was also hepatitis B positive. 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 His hepatitis C quantitative test was 49,200 in 2016, this month it is measuring less than 15.  He did not follow up after 09/2014 inpt consult with Dr. Orvan Falconer of infectious disease.  DDD, Chronic pain syndrome, was involved with the pain management clinic in 2014.  He underwent right knee arthroscopy a few months ago in [facility].  Admitted 6 weeks ago with drug overdose, right foot cellulitis, RLL infiltrate. Ruled in for MSSA bacteremia and possible meningitis.  Tricuspid valve endocarditis   Had hemoptysis and diffuse alveolar hemorrhage.   S/p chest tube placement of what was initially diagnosed as cavitary PNA.  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   Pulmonary service is unable to wean the patient off the chest tube due to high output of ~ 300 cc daily.   The fluid is a transudate.   Pulmonary wondering if TIPS might result in improvement of hydrothorax and removal of chest tube.  AKI has improved.  Left upper extremity radial vein DVT does not require anticoagulation. Early on acute encephalopathy/agitation required Precedex drip and he is now receiving  rifaximin as well as lactulose though his only ammonia level was normal at 28 three weeks ago.   07/19/16 renal ultrasound showed pleural effusions and abdominal ascites  08/12/16 portable ultrasound:  Stable cirrhosis without definite focal hepatic lesion.  8.2 diameter CBD.  Large volume ascites, bilateral pleural effusions. Poor visualization of pancreas. 08/18/16 CT abdomen pelvis:  Cirrhosis with ascites and splenomegaly. No obvious hepatic lesion.  Anasarca.  Marketed edema versus inflammation in second and third portions of duodenum with mucosal fold thickening.  Current diuretics consist of Lasix 60 mg, Aldactone 150 mg daily.  He's been weaned off all opiates.  Patient is not coagulopathic.  Renal function has steadily improved.  Latest LFTs from 8/11: Total bilirubin, alkaline phosphatase and transaminases were all normal, Lipase 125 Patient doesn't drink alcohol. His narcotics consist of prescription use. He denies recent injection drug use.  He is interested in having his hepatitis C treated.  Patient stopped taking lactulose several days ago because of diarrhea. He is still having about 5 loose stools per day.      Past Medical History:  Diagnosis Date  . Substance abuse     Past Surgical History:  Procedure Laterality Date  . Incision and drainage of bilateral forearm abscesses Bilateral   .  IR THORACENTESIS ASP PLEURAL SPACE W/IMG GUIDE  08/01/2016  . KNEE ARTHROSCOPY      Prior to Admission medications   Medication Sig Start Date End Date Taking? Authorizing Provider  SUBOXONE 8-2 MG FILM Take 2 Film by mouth daily. 07/02/16  Yes [provider]  amoxicillin-clavulanate (AUGMENTIN) 875-125 MG per tablet Take 1 tablet by mouth 2 (two) times daily. Patient not taking: Reported on 07/19/2016 09/16/14   Calvert Cantor, MD  cyclobenzaprine (FLEXERIL) 10 MG tablet Take 1 tablet (10 mg total) by mouth 2 (two) times daily as needed for muscle spasms. Patient not taking: Reported  on 09/12/2014 06/22/14   Elpidio Anis, PA-C  doxycycline (VIBRAMYCIN) 100 MG capsule Take 1 capsule (100 mg total) by mouth 2 (two) times daily. Patient not taking: Reported on 07/19/2016 09/16/14   Calvert Cantor, MD  ibuprofen (ADVIL,MOTRIN) 800 MG tablet Take 1 tablet (800 mg total) by mouth 3 (three) times daily. Patient not taking: Reported on 09/12/2014 06/22/14   Elpidio Anis, PA-C    Scheduled Meds: . Chlorhexidine Gluconate Cloth  6 each Topical Daily  . feeding supplement  1 Container Oral BID BM  . folic acid  1 mg Oral Daily  . furosemide  60 mg Oral Daily  . Gerhardt's butt cream   Topical Daily  . hydrALAZINE  50 mg Oral Q8H  . lactulose  30 g Oral Daily  . mouth rinse  15 mL Mouth Rinse q12n4p  . multivitamin  1 tablet Oral Daily  . pantoprazole  40 mg Oral Daily  . rifaximin  550 mg Oral BID  . risperiDONE  0.5 mg Oral BID  . sodium chloride flush  10-40 mL Intracatheter Q12H  . sodium chloride flush  3 mL Intravenous Q12H  . spironolactone  75 mg Oral BID  . thiamine  100 mg Oral Daily   Infusions: . cefTAZidime (FORTAZ)  IV Stopped (08/29/16 0915)  . dextrose     PRN Meds: albuterol, dextrose, haloperidol lactate, ipratropium, labetalol, ondansetron (ZOFRAN) IV, sodium chloride flush, sodium chloride flush   Allergies as of 07/18/2016  . (No Known Allergies)    History reviewed. No pertinent family history.  Social History   Social History  . Marital status: Single    Spouse name: N/A  . Number of children: N/A  . Years of education: N/A   Occupational History  . Patient was working in a dye factory prior to becoming ill.   Social History Main Topics  . Smoking status: Current Every Day Smoker    Packs/day: 0.50    Years: 15.00    Types: Cigarettes  . Smokeless tobacco: Never Used  . Alcohol use No     Comment: seldom once a year   . Drug use: Yes    Types: Other-see comments     Comment: not in last 2 wks -opana crushed & Injected IV 09/12/14  .  Sexual activity: Yes    Birth control/ protection: Implant, Other-see comments     Comment: married, wife with implant    Other Topics Concern  . Patient has custody of his 39 year old son.   Social History Narrative  . No narrative on file    REVIEW OF SYSTEMS: Constitutional:  Still weak but strength is progressing. ENT:  No nose bleeds Pulm:  Some chest pain from site of chest tube.  Productive cough. CV:  No palpitations, no LE edema.  GU:  No hematuria, no frequency GI:  No nausea vomiting. No abdominal  pain. No abdominal swelling. Heme:  No unusual bleeding or bruising.   Transfusions: received 1 unit packed red blood cells on 08/13/16 Neuro:  No headaches, no peripheral tingling or numbness Derm:  No itching, no rash or sores.  Endocrine:  No sweats or chills.  No polyuria or dysuria Immunization:  Did not inquire Travel:  None beyond local counties in last few months.    PHYSICAL EXAM: Vital signs in last 24 hours: Vitals:   08/28/16 2050 08/29/16 0559  BP: (!) 141/78 (!) 147/88  Pulse: 73 76  Resp: 18 18  Temp: 97.9 F (36.6 C) 98.1 F (36.7 C)  SpO2: 100% 99%   Wt Readings from Last 3 Encounters:  08/29/16 71.8 kg (158 lb 4.6 oz)  09/12/14 75.3 kg (166 lb)    General: somewhat cachectic, sallow, chronically ill appearing and malnourished looking WF Head:  No asymmetry, no signs of head trauma. Mild alopecia  Eyes:  No scleral icterus. No conjunctival pallor. EOMI. Ears:  Not hard of hearing  Nose:  No congestion or discharge Mouth:  Most teeth absent. Tongue midline. Oral mucosa moist and clear. Neck:  No JVD, no thyromegaly. No masses. Lungs:  Chest tube in place on right. Reduced breath sounds on right base. No shortness of breath or cough. Heart: RRR. No MRG. S1, S2 present. Abdomen:  Not tender, not distended. Soft. Unable to appreciate hepatosplenomegaly. No bruits. No masses..   Rectal: deferred   Musc/Skeltl: healing scars on the right  knee. Extremities:  Right greater than left lower extremity edema. Skin sloughing off the bottom of his right foot.  Neurologic:  Alert. Appropriate. Oriented times 3. No asterixis or tremor. Skin:  Sallow coloring. Tattoos:  multiple Nodes:  No cervical adenopathy   Psych:  Cooperative, calm, pleasant.  Intake/Output from previous day: 08/22 0701 - 08/23 0700 In: 1220 [P.O.:1020; IV Piggyback:200] Out: 1810 [Urine:1300; Chest Tube:510] Intake/Output this shift: Total I/O In: -  Out: 350 [Urine:150; Stool:200]  LAB RESULTS:  Recent Labs  08/27/16 0434 08/28/16 0446 08/29/16 0430  WBC 7.6 8.7 6.0  HGB 7.9* 8.9* 8.0*  HCT 24.6* 27.6* 25.1*  PLT 130* 173 157   BMET Lab Results  Component Value Date   NA 136 08/29/2016   NA 139 08/27/2016   NA 139 08/27/2016   K 4.0 08/29/2016   K 4.3 08/27/2016   K 4.2 08/27/2016   CL 109 08/29/2016   CL 112 (H) 08/27/2016   CL 112 (H) 08/27/2016   CO2 22 08/29/2016   CO2 22 08/27/2016   CO2 22 08/27/2016   GLUCOSE 84 08/29/2016   GLUCOSE 88 08/27/2016   GLUCOSE 75 08/27/2016   BUN 12 08/29/2016   BUN 13 08/27/2016   BUN 13 08/27/2016   CREATININE 1.41 (H) 08/29/2016   CREATININE 1.42 (H) 08/27/2016   CREATININE 1.51 (H) 08/27/2016   CALCIUM 7.7 (L) 08/29/2016   CALCIUM 7.7 (L) 08/27/2016   CALCIUM 7.4 (L) 08/27/2016   LFT  Recent Labs  08/26/16 1758 08/27/16 0434 08/27/16 1623  ALBUMIN 1.4* 1.4* 1.5*   PT/INR Lab Results  Component Value Date   INR 1.37 08/29/2016   INR 1.19 08/28/2016   INR 1.32 08/27/2016   Hepatitis Panel No results for input(s): HEPBSAG, HCVAB, HEPAIGM, HEPBIGM in the last 72 hours. C-Diff No components found for: CDIFF Lipase     Component Value Date/Time   LIPASE 125 (H) 08/17/2016 1007    Drugs of Abuse  Component Value Date/Time   LABOPIA NONE DETECTED 08/01/2016 1232   COCAINSCRNUR NONE DETECTED 08/01/2016 1232   LABBENZ POSITIVE (A) 08/01/2016 1232   AMPHETMU NONE  DETECTED 08/01/2016 1232   THCU NONE DETECTED 08/01/2016 1232   LABBARB NONE DETECTED 08/01/2016 1232     RADIOLOGY STUDIES: Dg Chest Port 1 View  Result Date: 08/28/2016 CLINICAL DATA:  Pleural effusion.  Chest tube. EXAM: PORTABLE CHEST 1 VIEW COMPARISON:  08/23/2016. FINDINGS: Right PICC line and right chest tube in stable position. Tiny right apical pneumothorax on today's exam cannot be excluded. Heart size stable. COPD. Stable mild bibasilar atelectasis. IMPRESSION: 1. Right PICC line and right chest tube stable position. Tiny right apical pneumothorax cannot be excluded on today's exam. 2. Persistent mild bibasilar atelectasis. Small right pleural effusion cannot be excluded. Critical Value/emergent results were called by telephone at the time of interpretation on 08/28/2016 at 7:57 am to nurse Aurea Graff , who verbally acknowledged these results. Electronically Signed   By: Maisie Fus  Register   On: 08/28/2016 08:01     IMPRESSION:   *  Cirrhosis of the liver. Hepatitis C, hepatitis B positive.  *  Ascites.  *  Right hepatic hydrothorax.    PLAN:     *  Per Dr Christella Hartigan.    *  At discharge patient should be set up with appointment to meet with infectious diseases for assessment and initiation of treatment for his hepatitis.   Jennye Moccasin  08/29/2016, 10:49 AM Pager: [phone]  ________________________________________________________________________  [facility] MD note:  I personally examined the patient, reviewed the data and agree with the assessment and plan described above.  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 I agree that the chest tube fluid is hepatic hydrothorax.  He is on medium doses of the usual diuretics that we use for ascites, fluid control in cirrhosis and I will increase them today (aldactone to be 250mg  daily and lasix to be 50mg  twice daily). Low salt diet is also important and I explained to him that he should be as vigilant as possible about no extra  salt in his diet. Will change his diet orders as well.  Will need to monitor his electrolytes, renal function.  Usually max doses of diuretics is 300 aldactone and 120 lasix as long as renal function will tolerated. After that level, if still unable to control his CT output then could consider TIPS. ID will need to comment if it comes to that given recent endocarditis).    Will follow. Thanks    Rob Bunting, MD [facility] Pager [phone]

## 2016-08-29 NOTE — Progress Notes (Signed)
Physical Therapy Treatment Patient Details Name: Johnathan [name] MRN: [id number] DOB: 1970-06-28 Today's Date: 08/29/2016    History of Present Illness Pt admitted 7/13 with sepsis, MSSA bacteremia, B LE celllulitis, tricuspid valve vegetation, R LL cavity PNA with pleural effusion, hep C. Developed encephalopathy and agitation and transferred to ICU 7/17. + DVT LUE. Intubated 8/3-8/13; s/p CT placement 8/5 secondary to effusion. PMH: polysubstance abuse.    PT Comments    Pt performed increased gait and progressed to standing exercises with support from counter.  Pt remains to show slight scissoring when gait training but responds to cues well.  Pt fatigued after session.     Follow Up Recommendations  Supervision/Assistance - 24 hour;Home health PT     Equipment Recommendations  Rolling walker with 5" wheels    Recommendations for Other Services Speech consult     Precautions / Restrictions Precautions Precautions: Fall Precaution Comments: Chest tube Restrictions Weight Bearing Restrictions: No    Mobility  Bed Mobility Overal bed mobility: Modified Independent;Needs Assistance       Supine to sit: Supervision     General bed mobility comments: Pt performed with HOB elevated and use of rail, no phyiscal assistance needed,  Supervision for safety and management of lines and leads.    Transfers Overall transfer level: Needs assistance Equipment used: Rolling walker (2 wheeled) Transfers: Sit to/from Stand Sit to Stand: Supervision         General transfer comment: cues for hand placement  Ambulation/Gait Ambulation/Gait assistance: Min guard Ambulation Distance (Feet): 350 Feet Assistive device: Rolling walker (2 wheeled) Gait Pattern/deviations: Step-through pattern;Narrow base of support;Trunk flexed Gait velocity: decreased Gait velocity interpretation: Below normal speed for age/gender General Gait Details: Cues for upper trunk control, cues for  increasing BOS, Pt with slow scissoring.     Stairs            Wheelchair Mobility    Modified Rankin (Stroke Patients Only)       Balance Overall balance assessment: Needs assistance Sitting-balance support: No upper extremity supported;Feet supported Sitting balance-Leahy Scale: Good       Standing balance-Leahy Scale: Poor                              Cognition Arousal/Alertness: Awake/alert Behavior During Therapy: WFL for tasks assessed/performed Overall Cognitive Status: Within Functional Limits for tasks assessed                                 General Comments: Pt communicating well this session and following multi-step commands. Continues to demonstrate decreased awareness and poor safety. Remains impulsive.      Exercises Total Joint Exercises Knee Flexion: AROM;Both;10 reps;Standing General Exercises - Lower Extremity Hip ABduction/ADduction: AROM;Both;10 reps;Standing Heel Raises: AROM;Both;10 reps;Standing Mini-Sqauts: AROM;Both;10 reps;Standing    General Comments        Pertinent Vitals/Pain Pain Assessment: 0-10 Pain Score: 2  Pain Location: Chest tube site.   Pain Descriptors / Indicators: Aching;Discomfort;Tightness Pain Intervention(s): Monitored during session;Repositioned    Home Living                      Prior Function            PT Goals (current goals can now be found in the care plan section) Acute Rehab PT Goals Patient Stated Goal: To get tube out Potential to  Achieve Goals: Fair Progress towards PT goals: Progressing toward goals    Frequency    Min 3X/week      PT Plan Discharge plan needs to be updated    Co-evaluation              AM-PAC PT "6 Clicks" Daily Activity  Outcome Measure  Difficulty turning over in bed (including adjusting bedclothes, sheets and blankets)?: None Difficulty moving from lying on back to sitting on the side of the bed? :  None Difficulty sitting down on and standing up from a chair with arms (e.g., wheelchair, bedside commode, etc,.)?: A Little   Help needed walking in hospital room?: A Little Help needed climbing 3-5 steps with a railing? : A Little 6 Click Score: 17    End of Session Equipment Utilized During Treatment: Gait belt Activity Tolerance: Patient limited by fatigue Patient left: in bed;with call bell/phone within reach Nurse Communication: Mobility status PT Visit Diagnosis: Unsteadiness on feet (R26.81);Other symptoms and signs involving the nervous system (R29.898);Muscle weakness (generalized) (M62.81)     Time: 1610-9604 PT Time Calculation (min) (ACUTE ONLY): 17 min  Charges:  $Therapeutic Activity: 8-22 mins                    G Codes:       [name], PTA pager [phone]    Florestine Avers 08/29/2016, 2:25 PM

## 2016-08-29 NOTE — Progress Notes (Addendum)
PROGRESS NOTE  Johnathan [name]  BDZ:[id number] DOB: 1970-12-13 DOA: 07/19/2016   PCP: [name], MD   Brief Narrative:  46 yo Male PMHx Hep C and IVDA, Polysubstance Abuse, presented with one week duration of fevers, redness and swelling on the dorsum of the right foot. Pt with ongoing use of opioids, injected Opana 2 days PTA. Pt admitted with right foot cellulitis as well as RLL infiltrate, found to have MSSA bacteremia with possible Meningitis. On 7/17, pt developed acute encephalopathy with agitation, combative requiring precedex drip.   Subjective: AAOX3, no CP, no SOB, no nausea, no vomiting. Still with high output from CT.   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 - due to cavitary PNA, septic emboli and aspiration  - PCCM following for effusion and management of chest tube - current chest tube output ~ 300-400 cc overnight/today; looking for less than 200 to be able to safely d/c CT - plan currently is to max out if needed diuretic regimen -will follow clinical response  - if we can not control effusion medically, pt may be TIPS candidate as he has clinically improved and has not experience any further hemoptysis  -will follow up PCCM and GI rec's   MSSA and Pseudomonas bacteremia w/ sepsis/Tricuspid valve Endocarditis  - CSF culture negative - ceftazidime changed to 2 g IV every 8 hours, continue through 08/31/2016 as per ID plan. -afebrile and stable. -will continue IV antibiotics   Hepatitis C/positive Hep B - liver cirrhosis  - Patient currently not candidate for treatment secondary to drug abuse - will need outpatient follow up after proving abstinence for 6 months   LUE Radial Vein DVT  - Seen on duplex 7/24.  -No PE  on CTA 7/24 -no swelling appreciated   Acute Encephalopathy -resolved now -multifactorial in etiology -will continue monitoring and keeping reorientation     Acute Renal Failure  - Cr  3.17 on admission: Secondary to dehydration.  -significantly improved -Cr 1.41 now (last check 8/23) -will continue monitoring intermittently -Advise for to maintain adequate hydration  -will be extra vigilant as diuretic dose has been increased   Hypokalemia/hypomagnesemia -stable currently -probably associated with diuretics usage and overall poor nutrition  -will monitor trend and further replete as needed   Hypernatremia -resolved now -will monitor trend intermittently especially with diuretics adjustment    Drug abuse and dependence  -cessation counseling provided -patient considering keeping himself clean -will provide with outpatient resources to help him with that task  Protein calorie malnutrition -will follow nutritional service rec's -continue feeding supplements  -patient encourage to follow diet as instructed   Thrombocytopenia, anemia -no signs of bleeding -will continue monitoring trend   DVT prophylaxis: SCD Code Status: Full Family Communication: no family at bedside Disposition Plan: pt with no insurance so can not go to [facility] or SNF apparently. Will continue IV antibiotics and follow PCCM rec's for further chest tube decisions.  Consultants:  PCCM ID  GI  Procedures/Significant Events:  7/13 - Admit to [facility] 7/17 - Transfer to ICU for precedex infusion 7/21 - Precedex off  7/22 - TRH resumed care 7/24 - venous duplex L UE radial vein DVT 7/25 - PCCM signed off 8/02 - Patient hypothermic & more agitated 8/03 - acute tachypnea w/ "guppy  breathing" > emergently intubated w/ bronchoscopy showing "mild diffuse alveolar hemorrhage" 8/05 - Right chest tube inserted w/ 350 mL serous fluid. Bicarb gtt started for acidosis 8/06 - Switched off Cefepime to [place] given  potential for worsening encephalopathy  8/08 - Albumin w/ Lasix today. Improving UOP.  8/09 - Albumin w/ Lasix q6hr x3 doses. Precedex started in an attempt to wean Fentanyl drip.  8/11 - N/V of bilious emesis overnight & continued diuresis 8/12 - Continued diuresis w/ Lasix & added Aldactone daily. Patient apneic during SBT. Decreasing chest tube output. 8/13 - extubated  8/22-8/23--> adjusting/maximizing diuretic dose for cirrhosis and hydrothorax. Trying to remove CT if possible.  RENAL U/S 7/13:   IMPRESSION: 1. Mild right pelvicaliectasis, with ureteral jet noted in the urinary bladder excluding significant ureteral obstruction. 2. Small left renal cyst. 3. Pleural effusions and abdominal ascites. LP 7/13:  Tube 1 - WBC 51 (62% lymph, 29% lymph, 9% mono), RBC 555, Protein 88 , & Glucose 52. / Tube 4 - WBC 180 (58% neutro, 31% lymph, 11% monocytes) & RBC 36. TTE 7/14:  LV normal in size with EF 55-60%. No regional wall motion abnormalities & grade 1 diastolic dysfunction. LA upper limits of normal in size & RA normal in size. RV normal in size and function. No aortic stenosis or regurgitation. Aortic root normal in size. No mitral stenosis or regurgitation. No pulmonic stenosis or regurgitation with poorly visualized valve. No tricuspid regurgitation. Medium sized mobile vegetation noted on tricuspid valve. No pericardial effusion. CT HEAD W/O 7/16:  Progressive atrophy since 2011.  No acute intracranial findings. No abnormal enhancement or vasogenic edema to suggest septic emboli to the brain. VENOUS DUPLEX BILATERAL LOWER EXTREMITY 7/17:  No DVT or SVT.  CTA CHEST 7/24:  Negative for a pulmonary embolism. Extensive pleural-parenchymal lung disease bilaterally. Large right pleural effusion and moderate-sized left pleural effusion. Pleural-based nodular opacities could represent an infectious etiology but also raise concern for a neoplastic process. Concern for a cavitary lesion and necrotic  tissue in the right lung. Consider sampling of the pleural fluid. Recommend follow-up CT when the pleural fluid and acute process have resolved to exclude neoplastic disease. Severe emphysematous disease. Cirrhosis with evidence for portal hypertension based on the splenomegaly and ascites. 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  VENOUS DUPLEX LUE 7/24:  Acute DVT involving small portion of left radial vein in mid-forearm.  CT HEAD W/O 7/26:  Normal head CT. Right Pleural Effusion 7/26:  1.5 L hazy yellow fluid by IR. Protein <3.0, Albumin <1.0, Amylase 39, Glucose 72, LDH 72, & WBC 2431 (11% lymph, 82% neutro, & 7% mono). Cytology negative for malignancy.  MRI BRAIN W/O 8/2:  Exam is limited to diffusion only. Negative for acute infarct. No area of restricted diffusion. EEG 8/2:  This EEG is abnormal due to moderate diffuse slowing of the background. Lingula BAL 8/3:  WBC 78 (39% lymph, 31% neutro, 30% mono). Reported DAH.  TTE 8/3:  LV normal in size with EF 55-60%. No regional wall motion abnormality & grade 1 diastolic dysfunction. LA & RA normal in size. RV normal in size and function. No aortic stenosis or regurgitation. Aortic root normal in size. No mitral stenosis or regurgitation. No pulmonic stenosis. Trivial regurgitation. Trivial pericardial effusion as well. Notably no vegetations were seen on this exam. Right Pleural Effusion 8/5:  WBC 616 (1% lymph, 1% eos, & 98% neutro). PORT CXR 8/5:  Previously reviewed by me.  Right-sided predominant opacities. Right-sided chest tube in place. Endotracheal tube in good position. Enteric feeding tube in good position. Right upper extremity PICC line in good position. Complete Abdominal U/S 8/6: Stable cirrhotic changes involving the liver without definite focal hepatic lesion. Mild common bile duct dilatation without obvious common bile duct stone or intrahepatic biliary dilatation. Poor visualization of the pancreas. Increased  echogenicity of both kidneys suggesting medical renal disease. No hydronephrosis. Large volume ascites and bilateral pleural effusions. Port CXR 8/7:  Previously reviewed by me. Hazy opacities bilateral lower lung zones. Endotracheal tube & enteric feeding tube in good position. Right chest tube in good position.  PORT CXR 8/11:  Personally reviewed by me. Patchy hazy opacities bilaterally which are improving. No appreciable residual pleural effusion on the right. Right-sided chest tube in good position. Endotracheal tube and right upper extremity central venous catheter in good position. Enteric feeding tube coursing below diaphragm. PORT ABD X-RAY 8/11: Paucity of bowel gas without evidence of enteric obstruction. Enteric tube tip and side port project over the expected location of the mid body of the stomach. CT abd / pelv 8/12 > persistent bilateral pleural effusion (though improved), 3cm irregular rounded density in RLL.  Cirrhosis with splenomegaly and ascites, anasarca. Inflammatory vs infectious process involving duodenum  Cultures Blood Cultures x2 7/12:  2/2 Bottles MSSA & 1/2 Bottles Pseudomonas aeruginosa  CSF Culture 7/13:  Negative  MRSA PCR 7/13:  Negative HIV 7/13:  Negative Respiratory Viral Panel PCR 7/13:  Negative  Urine Culture 7/13:  Multiple Species Present Blood Cultures x2 7/14:  1/2 Bottles MSSA Blood Culture x1 7/15:  Negative  MRSA PCR 7/17:  Negative  Urine Culture 7/17:  Negative  Urine Streptococcal Antigen 7/17:  Negative Urine Legionella Antigen 7/17:  Negative  Right Pleural Fluid Culture 7/26 >>> Bacteria Negative / AFB pending / Fungus pending Lingula BAL 8/3:  Candida tropicalis  Blood Cultures x2 8/3:  Negative  Right Pleural Culture 8/5:  Negative  Blood Culture x2 8/6:  Negative   Antimicrobials: Anti-infectives    Start     Stop   08/22/16 1100  rifaximin (XIFAXAN) tablet 550 mg         08/19/16 1000  cefTAZidime (FORTAZ) 1 g in dextrose 5 % 50  mL IVPB     08/31/16 2359   08/12/16 1100  cefTAZidime (FORTAZ) 2 g in dextrose 5 % 50 mL IVPB  Status:  Discontinued     08/19/16 0555   08/11/16 1000  ceFEPIme (MAXIPIME) 1 g in dextrose 5 % 50 mL IVPB  Status:  Discontinued     08/12/16 0936   08/10/16 1500  vancomycin (VANCOCIN) 1,250 mg in sodium chloride 0.9 % 250 mL IVPB  Status:  Discontinued     08/11/16 0930   08/10/16 1000  ceFEPIme (MAXIPIME) 2 g in dextrose 5 % 50 mL IVPB  Status:  Discontinued     08/11/16 0930   08/09/16 1300  vancomycin (VANCOCIN) 1,500 mg in sodium chloride 0.9 % 500 mL IVPB     08/09/16 1602   07/22/16 1400  ceFEPIme (MAXIPIME) 2 g in dextrose 5 % 50 mL IVPB  Status:  Discontinued     08/09/16 1226   07/20/16 1500  ceFEPIme (MAXIPIME) 2 g in dextrose 5 % 50 mL IVPB  Status:  Discontinued     07/22/16 1122   07/19/16 2300  vancomycin (VANCOCIN) IVPB 750 mg/150 ml premix  Status:  Discontinued     07/19/16  7846   07/19/16 2200  ceFEPIme (MAXIPIME) 2 g in dextrose 5 % 50 mL IVPB  Status:  Discontinued     07/20/16 1410   07/19/16 2000  nafcillin 2 g in dextrose 5 % 100 mL IVPB  Status:  Discontinued     07/22/16 1130   07/19/16 1915  nafcillin injection 2 g  Status:  Discontinued     07/19/16 1915   07/19/16 1800  ceFEPIme (MAXIPIME) 2 g in dextrose 5 % 50 mL IVPB  Status:  Discontinued     07/19/16 1912   07/19/16 1100  vancomycin (VANCOCIN) 500 mg in sodium chloride 0.9 % 100 mL IVPB  Status:  Discontinued     07/19/16 1645   07/19/16 0800  piperacillin-tazobactam (ZOSYN) IVPB 3.375 g  Status:  Discontinued     07/19/16 1645   07/19/16 0200  cefTRIAXone (ROCEPHIN) 2 g in dextrose 5 % 50 mL IVPB     07/19/16 0259   07/19/16 0130  cefTRIAXone (ROCEPHIN) 1 g in dextrose 5 % 50 mL IVPB  Status:  Discontinued     07/19/16 0140   07/19/16 0115  piperacillin-tazobactam (ZOSYN) IVPB 3.375 g     07/19/16 0219   07/19/16 0115  vancomycin (VANCOCIN) IVPB 1000 mg/200 mL premix     07/19/16 0243     LINES  / TUBES:  OETT 8/3 >>>8/13 Rt CHEST TUBE 8/5 >>> RUE DL PICC 7/27 >>>  Continuous Infusions: . cefTAZidime (FORTAZ)  IV Stopped (08/29/16 1740)  . dextrose     Objective: Vitals:   08/28/16 2050 08/29/16 0500 08/29/16 0559 08/29/16 1525  BP: (!) 141/78  (!) 147/88 140/88  Pulse: 73  76 78  Resp: _0 Temp: 97.9 F (36.6 C)  98.1 F (36.7 C) (!) 97.5 F (36.4 C)  TempSrc: Oral  Oral Oral  SpO2: 100%  99% 99%  Weight:  71.8 kg (158 lb 4.6 oz)    Height:        Intake/Output Summary (Last 24 hours) at 08/29/16 1820 Last data filed at 08/29/16 1600  Gross per 24 hour  Intake              400 ml  Output             1600 ml  Net            -1200 ml   Filed Weights   08/26/16 0500 08/28/16 0610 08/29/16 0500  Weight: 72.9 kg (160 lb 11.5 oz) 72 kg (158 lb 11.7 oz) 71.8 kg (158 lb 4.6 oz)   Physical Exam  Constitutional: afebrile, no CP and no SOB. Patient w/o nausea, vomiting or abd pain. Continue to have high output from CT. CVS: RRR, no rubs, no gallops, no murmur  Pulmonary: normal resp effort, no wheezing, decrease BS on right base and CT in right costal area in place; No using accessory muscles  Abdominal: soft, NT, ND, positive BS, no distension seen on exam.  Musculoskeletal: no joint swelling appreciated; FROM.   Psychiatric: AAOX3, no focal deficit appreciated. Patient report feeling weak and tired (generalized). Good insight.    Data Reviewed: I have reviewed patient's blood work today, vital signs, discussed with patient at bedside  CBC:  Recent Labs Lab 08/25/16 0355 08/26/16 0426 08/27/16 0434 08/28/16 0446 08/29/16 0430  WBC 8.3 7.2 7.6 8.7 6.0  NEUTROABS 5.5 4.5 4.0 4.5 2.9  HGB 7.9* 8.1* 7.9* 8.9* 8.0*  HCT 25.3* 25.2* 24.6*  27.6* 25.1*  MCV 88.5 88.7 87.2 87.1 86.6  PLT 105* 117* 130* 173 166   Basic Metabolic Panel:  Recent Labs Lab 08/25/16 0355 08/25/16 1714 08/26/16 0426 08/26/16 1758 08/27/16 0434 08/27/16 1623 08/28/16 0446  08/29/16 0430  NA 135  136 135  136 138 138 139 139  --  136  K 3.0*  3.0* 3.3*  3.3* 3.6 4.1 4.2 4.3  --  4.0  CL 103  105 104  105 108 109 112* 112*  --  109  CO2 _0 --  22  GLUCOSE 100*  104* 87  89 81 80 75 88  --  84  BUN _1 --  12  CREATININE 1.76*  1.76* 1.65*  1.63* 1.54* 1.51* 1.51* 1.42*  --  1.41*  CALCIUM 7.2*  7.3* 7.4*  7.3* 7.3* 7.5* 7.4* 7.7*  --  7.7*  MG 1.4*  --  1.8  --  1.4*  --  2.1 1.7  PHOS 3.3 3.3 3.5 3.3 3.4 3.6  --   --     Liver Function Tests:  Recent Labs Lab 08/25/16 1714 08/26/16 0426 08/26/16 1758 08/27/16 0434 08/27/16 1623  ALBUMIN 1.4* 1.4* 1.4* 1.4* 1.5*   Coagulation Profile:  Recent Labs Lab 08/25/16 0355 08/26/16 0426 08/27/16 0434 08/28/16 0446 08/29/16 0430  INR 1.26 1.27 1.32 1.19 1.37   CBG:  Recent Labs Lab 08/22/16 2017  GLUCAP 131*   Urine analysis:    Component Value Date/Time   COLORURINE RED (A) 08/22/2016 1015   APPEARANCEUR CLOUDY (A) 08/22/2016 1015   LABSPEC 1.009 08/22/2016 1015   PHURINE 6.0 08/22/2016 1015   GLUCOSEU (A) 08/22/2016 1015    TEST NOT REPORTED DUE TO COLOR INTERFERENCE OF URINE PIGMENT   HGBUR (A) 08/22/2016 1015    TEST NOT REPORTED DUE TO COLOR INTERFERENCE OF URINE PIGMENT   BILIRUBINUR (A) 08/22/2016 1015    TEST NOT REPORTED DUE TO COLOR INTERFERENCE OF URINE PIGMENT   KETONESUR (A) 08/22/2016 1015    TEST NOT REPORTED DUE TO COLOR INTERFERENCE OF URINE PIGMENT   PROTEINUR (A) 08/22/2016 1015    TEST NOT REPORTED DUE TO COLOR INTERFERENCE OF URINE PIGMENT   NITRITE (A) 08/22/2016 1015    TEST NOT REPORTED DUE TO COLOR INTERFERENCE OF URINE PIGMENT   LEUKOCYTESUR (A) 08/22/2016 1015    TEST NOT REPORTED DUE TO COLOR INTERFERENCE OF URINE PIGMENT   Radiology Studies: Dg Chest Port 1 View  Result Date: 08/28/2016 CLINICAL DATA:  Pleural effusion.  Chest tube. EXAM: PORTABLE CHEST 1 VIEW COMPARISON:  08/23/2016.  FINDINGS: Right PICC line and right chest tube in stable position. Tiny right apical pneumothorax on today's exam cannot be excluded. Heart size stable. COPD. Stable mild bibasilar atelectasis. IMPRESSION: 1. Right PICC line and right chest tube stable position. Tiny right apical pneumothorax cannot be excluded on today's exam. 2. Persistent mild bibasilar atelectasis. Small right pleural effusion cannot be excluded. Critical Value/emergent results were called by telephone at the time of interpretation on 08/28/2016 at 7:57 am to nurse Remo Lipps , who verbally acknowledged these results. Electronically Signed   By: Marcello Moores  Register   On: 08/28/2016 08:01    Scheduled Meds: . Chlorhexidine Gluconate Cloth  6 each Topical Daily  . feeding supplement  1 Container Oral BID BM  . folic acid  1 mg Oral Daily  . furosemide  50 mg Oral Q12H  . Gerhardt's butt cream   Topical Daily  . hydrALAZINE  50 mg Oral Q8H  . lactulose  30 g Oral Daily  . mouth rinse  15 mL Mouth Rinse q12n4p  . multivitamin  1 tablet Oral Daily  . pantoprazole  40 mg Oral Daily  . rifaximin  550 mg Oral BID  . risperiDONE  0.5 mg Oral BID  . sodium chloride flush  10-40 mL Intracatheter Q12H  . sodium chloride flush  3 mL Intravenous Q12H  . spironolactone  125 mg Oral BID  . thiamine  100 mg Oral Daily   Continuous Infusions: . cefTAZidime (FORTAZ)  IV Stopped (08/29/16 1740)  . dextrose       LOS: 41 days   Time spent: 25 minutes   Barton Dubois MD  [facility] Pager [phone]  If 7PM-7AM, please contact night-coverage [email or web address] Password [email or web address] 08/29/2016, 6:20 PM

## 2016-08-29 NOTE — Progress Notes (Signed)
PULMONARY / CRITICAL CARE MEDICINE CONSULT   Name: Johnathan [name] MRN: [id number] DOB: 08-15-1970    ADMISSION DATE:  07/19/2016 CONSULTATION DATE:  07/23/2016  REFERRING MD:  Ree Kida  HISTORY OF PRESENT ILLNESS:  46 y.o. malew Hep C?, IVDA, (recently injected opana) apparently c/o fever intermittently for  1 week, and redness over the dorsum of the right foot and slight swelling. Pt notes injecting Opana about 2 days ago and cellulitis in right foot noted on admission 7/13 with labs showing EKG and abnormal LFTs with hyponatremia 123 and right lower lobe infiltrate on chest x-ray. Found to have MSSA bacteremia, possible meningitis, and LE cellulitis.  Echocardiogram Showed EF 05-69%, grade 1 diastolic dysfunction, tricuspid valve with mobile vegetation. On 7/17 He developed acute encephalopathy with agitation, becoming belligerent and combative. He denies ETOH abuse, but admitted to IVDA. He was placed on CIWA protocol, however, scores remained 19-23, patient was not responsive  to ativan, transferred to ICU for precedex gtt   SUBJECTIVE:  No distress  VITAL SIGNS: BP (!) 147/88 (BP Location: Left Arm)   Pulse 76   Temp 98.1 F (36.7 C) (Oral)   Resp 18   Ht _0  (1.803 m)   Wt 158 lb 4.6 oz (71.8 kg)   SpO2 99%   BMI 22.08 kg/m    INTAKE / OUTPUT:   PHYSICAL EXAMINATION: General appearance:  46 year old  Male cachectic  NAD, currently in acute distress Mouth:  membranes and no mucosal ulcerations; normal hard and soft palate Neck: Trachea midline; neck supple, no JVD Lungs/chest: CTA, with normal respiratory effort and no intercostal retractions, his right chest tube has clear straw colored output CV: RRR, no MRGs  Abdomen: Soft, non-tender; no masses or HSM Extremities: No peripheral edema or extremity lymphadenopathy Skin: Normal temperature, turgor and texture; no rash, ulcers or subcutaneous nodules Psych: Appropriate affect, alert and oriented to person, place  and time  LABS:  BMET  Recent Labs Lab 08/27/16 0434 08/27/16 1623 08/29/16 0430  NA 139 139 136  K 4.2 4.3 4.0  CL 112* 112* 109  CO2 _1 BUN _2 CREATININE 1.51* 1.42* 1.41*  GLUCOSE 75 88 84    Electrolytes  Recent Labs Lab 08/26/16 1758 08/27/16 0434 08/27/16 1623 08/28/16 0446 08/29/16 0430  CALCIUM 7.5* 7.4* 7.7*  --  7.7*  MG  --  1.4*  --  2.1 1.7  PHOS 3.3 3.4 3.6  --   --     CBC  Recent Labs Lab 08/27/16 0434 08/28/16 0446 08/29/16 0430  WBC 7.6 8.7 6.0  HGB 7.9* 8.9* 8.0*  HCT 24.6* 27.6* 25.1*  PLT 130* 173 157    Coag's  Recent Labs Lab 08/27/16 0434 08/28/16 0446 08/29/16 0430  APTT 70* 77* 60*  INR 1.32 1.19 1.37    Sepsis Markers No results for input(s): LATICACIDVEN, PROCALCITON, O2SATVEN in the last 168 hours.  ABG No results for input(s): PHART, PCO2ART, PO2ART in the last 168 hours.  Liver Enzymes  Recent Labs Lab 08/26/16 1758 08/27/16 0434 08/27/16 1623  ALBUMIN 1.4* 1.4* 1.5*    Cardiac Enzymes No results for input(s): TROPONINI, PROBNP in the last 168 hours.  Glucose  Recent Labs Lab 08/22/16 1148 08/22/16 1612 08/22/16 2017  GLUCAP 115* 110* 131*    Imaging No results found.  STUDIES:  RENAL U/S 7/13:   IMPRESSION: 1. Mild right pelvicaliectasis, with ureteral jet noted in the urinary bladder excluding significant ureteral  obstruction. 2. Small left renal cyst. 3. Pleural effusions and abdominal ascites. LP 7/13:  Tube 1 - WBC 51 (62% lymph, 29% lymph, 9% mono), RBC 555, Protein 88 , & Glucose 52. / Tube 4 - WBC 180 (58% neutro, 31% lymph, 11% monocytes) & RBC 36. TTE 7/14:  LV normal in size with EF 55-60%. No regional wall motion abnormalities & grade 1 diastolic dysfunction. LA upper limits of normal in size & RA normal in size. RV normal in size and function. No aortic stenosis or regurgitation. Aortic root normal in size. No mitral stenosis or regurgitation. No pulmonic stenosis  or regurgitation with poorly visualized valve. No tricuspid regurgitation. Medium sized mobile vegetation noted on tricuspid valve. No pericardial effusion. CT HEAD W/O 7/16:  Progressive atrophy since 2011.  No acute intracranial findings. No abnormal enhancement or vasogenic edema to suggest septic emboli to the brain. VENOUS DUPLEX BILATERAL LOWER EXTREMITY 7/17:  No DVT or SVT.  CTA CHEST 7/24:   IMPRESSION: 1. Negative for a pulmonary embolism. 2. Extensive pleural-parenchymal lung disease bilaterally. Large right pleural effusion and moderate-sized left pleural effusion. Pleural-based nodular opacities could represent an infectious etiology but also raise concern for a neoplastic process. Concern for a cavitary lesion and necrotic tissue in the right lung. Consider sampling of the pleural fluid. Recommend follow-up CT when the pleural fluid and acute process have resolved to exclude neoplastic disease. 3. Severe emphysematous disease. 4. Cirrhosis with evidence for portal hypertension based on the splenomegaly and ascites. 5. 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 Recommend annual imaging followup by CTA or MRA. This recommendation follows 2010 ACCF/AHA/AATS/ACR/ASA/SCA/SCAI/SIR/STS/SVM Guidelines for the Diagnosis and Management of Patients with Thoracic Aortic Disease. Circulation. 2010; 121: Z610-R604 VENOUS DUPLEX LUE 7/24:  Acute DVT involving small portion of left radial vein in mid-forearm.  CT HEAD W/O 7/26:  Normal head CT. Right Pleural Effusion 7/26:  1.5 L hazy yellow fluid by IR. Protein <3.0, Albumin <1.0, Amylase 39, Glucose 72, LDH 72, & WBC 2431 (11% lymph, 82% neutro, & 7% mono). Cytology negative for malignancy.  MRI BRAIN W/O 8/2:  Exam is limited to diffusion only. Negative for acute infarct. No area of restricted diffusion. EEG 8/2:  This EEG is abnormal due to moderate diffuse slowing of the background. Lingula BAL 8/3:  WBC 78 (39% lymph, 31%  neutro, 30% mono). Reported DAH.  TTE 8/3:  LV normal in size with EF 55-60%. No regional wall motion abnormality & grade 1 diastolic dysfunction. LA & RA normal in size. RV normal in size and function. No aortic stenosis or regurgitation. Aortic root normal in size. No mitral stenosis or regurgitation. No pulmonic stenosis. Trivial regurgitation. Trivial pericardial effusion as well. Notably no vegetations were seen on this exam. Right Pleural Effusion 8/5:  WBC 616 (1% lymph, 1% eos, & 98% neutro). PORT CXR 8/5:  Previously reviewed by me. Right-sided predominant opacities. Right-sided chest tube in place. Endotracheal tube in good position. Enteric feeding tube in good position. Right upper extremity PICC line in good position. Complete Abdominal U/S 8/6: IMPRESSION: 1. Stable cirrhotic changes involving the liver without definite focal hepatic lesion. 2. Mild common bile duct dilatation without obvious common bile duct stone or intrahepatic biliary dilatation. 3. Poor visualization of the pancreas. 4. Increased echogenicity of both kidneys suggesting medical renal disease. No hydronephrosis. 5. Large volume ascites and bilateral pleural effusions. Port CXR 8/7:  Previously reviewed by me. Hazy opacities bilateral lower lung zones.  Endotracheal tube & enteric feeding tube in good position. Right chest tube in good position.  PORT CXR 8/11:  Personally reviewed by me. Patchy hazy opacities bilaterally which are improving. No appreciable residual pleural effusion on the right. Right-sided chest tube in good position. Endotracheal tube and right upper extremity central venous catheter in good position. Enteric feeding tube coursing below diaphragm. PORT ABD X-RAY 8/11: IMPRESSION: 1. Paucity of bowel gas without evidence of enteric obstruction. 2. Enteric tube tip and side port project over the expected location of the mid body of the stomach. CT abd / pelv 8/12 > persistent bilateral pleural  effusion (though improved), 3cm irregular rounded density in RLL.  Cirrhosis with splenomegaly and ascites, anasarca. Inflammatory vs infectious process involving duodenum.  MICROBIOLOGY: Blood Cultures x2 7/12:  2/2 Bottles MSSA & 1/2 Bottles Pseudomonas aeruginosa  CSF Culture 7/13:  Negative  MRSA PCR 7/13:  Negative HIV 7/13:  Negative Respiratory Viral Panel PCR 7/13:  Negative  Urine Culture 7/13:  Multiple Species Present Blood Cultures x2 7/14:  1/2 Bottles MSSA Blood Culture x1 7/15:  Negative  MRSA PCR 7/17:  Negative  Urine Culture 7/17:  Negative  Urine Streptococcal Antigen 7/17:  Negative Urine Legionella Antigen 7/17:  Negative  Right Pleural Fluid Culture 7/26 >>> Bacteria Negative / AFB pending / Fungus pending Lingula BAL 8/3:  Candida tropicalis  Blood Cultures x2 8/3:  Negative  Right Pleural Culture 8/5:  Negative  Blood Culture x2 8/6:  Negative   ANTIBIOTICS: Ceftriaxone 7/13 (x1 dose) Zosyn 7/13 (x1 dose) Nafcillin 7/13 - 7/16 Cefepime 7/13 - 8/6 Vancomycin 7/13 (x1 dose); restarted 8/3 - 8/7 Tressie Ellis 8/6 >>> (plan to continue through 8/25 per ID recs)  SIGNIFICANT EVENTS: 7/17 - Transfer to ICU for precedex infusion 7/21 - Precedex off  7/25 - PCCM signed off 8/02 - Patient hypothermic & more agitated 8/03 - acute tachypnea w/ "guppy breathing" >> emergently intubated w/ bronchoscopy showing "mild diffuse alveolar hemorrhage" 8/05 - Right chest tube inserted w/ 350 mL serous fluid. Bicarb gtt started for acidosis 8/06 - Switched off Cefepime to [place] given potential for worsening encephalopathy  8/08 - Albumin w/ Lasix today. Improving UOP.  8/09 - Albumin w/ Lasix q6hr x3 doses. Precedex started in an attempt to wean Fentanyl drip.  8/11 - N/V of bilious emesis overnight & continued diuresis 8/12 - Continued diuresis w/ Lasix & added Aldactone daily. Patient apneic during SBT. Decreasing chest tube output. 8/16 diuretics backed down d/t hypernatremia    LINES/TUBES: OETT 8/3 >>>8/13 R CHEST TUBE 8/5 >>> RUE DL PICC 7/27 >>> Foley >>>  ASSESSMENT / PLAN: Treated Issues:  Acute hypoxic respiratory failure - Multifactorial.  Extubated 8/13 and tolerated well. Hemoptysis - Likely due to cavitary pneumonia. Improving. No bright red blood. 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  MSSA & Pseudomonas Bacteremia/Endocarditis LUE Radial Vein DVT Residual ALI  Right Lower Extremity Cellulitis - Resolved.  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 Currently on fortaz through 8/25 per ID Transudative Right Pleural Effusion - S/P Chest tube placement. 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 Fluid and electrolyte imbalance  AKI improving/stable  Discussion Interval from 8/17 to 8/23 Adjusting diuretics since 8/21; now up to spironolactone  Still 500 ml output a day. Na and creatinine tolerating.  Plan Increase spironolactone to 121m bid Increase lasix to 80 mg/d Repeat am chem Will ask GI to see him; specifically thoughts on TSix MileACNP-BC LMinatarePager # [phone] # [phone] no answer   Attending Note:  I have examined patient, reviewed labs, studies and notes. I have discussed the case with PJerrye Bushy and I agree with the data and plans as amended above. 46yo man, with hep C, cirrhosis, long and complicated admission for endocarditis and embolic PNAs, R effusion. He has improved but in the aftermath we have been dealing with high output transudative fluid from R chest tube, almost certainly hepatic hydrothorax. Clearly he is is at high risk for ascites, R effusion or both whenever the chest tube comes out. We have been working on getting him on better  diuretic regimen. On eval today he is comfortable. No tachypnea, normal resp pattern. Clear lungs. No significant edema. We will plan to increase aldactone and lasix again today 8/23. Will also ask GI to evaluate him. He may be a candidate for TIPS if we believe fluid output will not be manageable w diuretics. Also he will need outpt follow up with them.     RBaltazar Apo MD, PhD 08/29/2016, 11:59 AM [facility] [phone] if no answer [phone]

## 2016-08-29 NOTE — Progress Notes (Signed)
OT Cancellation Note  Patient Details Name: Johnathan [name] MRN: [id number] DOB: 1970/07/06   Cancelled Treatment:    Reason Eval/Treat Not Completed: Patient declined, no reason specified; Pt reports having just recently worked with PT, feeling fatigued and requesting to rest at this time. Will follow up as schedule permits.   Marcy Siren, OT Pager [phone] 08/29/2016   [name] 08/29/2016, 3:03 PM

## 2016-08-30 LAB — FUNGUS CULTURE WITH STAIN

## 2016-08-30 LAB — CBC WITH DIFFERENTIAL/PLATELET
Basophils Absolute: 0.1 10*3/uL (ref 0.0–0.1)
Basophils Relative: 1 %
EOS PCT: 4 %
Eosinophils Absolute: 0.3 10*3/uL (ref 0.0–0.7)
HEMATOCRIT: 24.9 % — AB (ref 39.0–52.0)
Hemoglobin: 8 g/dL — ABNORMAL LOW (ref 13.0–17.0)
LYMPHS PCT: 42 %
Lymphs Abs: 2.7 10*3/uL (ref 0.7–4.0)
MCH: 27.7 pg (ref 26.0–34.0)
MCHC: 32.1 g/dL (ref 30.0–36.0)
MCV: 86.2 fL (ref 78.0–100.0)
MONO ABS: 0.6 10*3/uL (ref 0.1–1.0)
Monocytes Relative: 8 %
NEUTROS PCT: 45 %
Neutro Abs: 2.9 10*3/uL (ref 1.7–7.7)
PLATELETS: 174 10*3/uL (ref 150–400)
RBC: 2.89 MIL/uL — AB (ref 4.22–5.81)
RDW: 17.8 % — ABNORMAL HIGH (ref 11.5–15.5)
WBC: 6.5 10*3/uL (ref 4.0–10.5)

## 2016-08-30 LAB — BASIC METABOLIC PANEL
ANION GAP: 8 (ref 5–15)
BUN: 12 mg/dL (ref 6–20)
CALCIUM: 7.8 mg/dL — AB (ref 8.9–10.3)
CHLORIDE: 107 mmol/L (ref 101–111)
CO2: 23 mmol/L (ref 22–32)
Creatinine, Ser: 1.46 mg/dL — ABNORMAL HIGH (ref 0.61–1.24)
GFR calc Af Amer: >60 mL/min (ref >60)
GFR calc non Af Amer: 56 mL/min — ABNORMAL LOW (ref >60)
GLUCOSE: 76 mg/dL (ref 65–99)
Potassium: 4 mmol/L (ref 3.5–5.1)
Sodium: 138 mmol/L (ref 135–145)

## 2016-08-30 LAB — MAGNESIUM: Magnesium: 1.4 mg/dL — ABNORMAL LOW (ref 1.7–2.4)

## 2016-08-30 LAB — FUNGAL ORGANISM REFLEX

## 2016-08-30 LAB — FUNGUS CULTURE RESULT

## 2016-08-30 MED ORDER — SPIRONOLACTONE 50 MG PO TABS
150.0000 mg | ORAL_TABLET | Freq: Two times a day (BID) | ORAL | Status: DC
  Administered 2016-08-30 – 2016-08-31 (×3): 150 mg via ORAL
  Filled 2016-08-30 (×3): qty 1

## 2016-08-30 MED ORDER — FUROSEMIDE 40 MG PO TABS
60.0000 mg | ORAL_TABLET | Freq: Once | ORAL | Status: AC
  Administered 2016-08-30: 10:00:00 60 mg via ORAL
  Filled 2016-08-30: qty 1

## 2016-08-30 MED ORDER — MAGNESIUM SULFATE 2 GM/50ML IV SOLN
2.0000 g | Freq: Once | INTRAVENOUS | Status: AC
  Administered 2016-08-30: 2 g via INTRAVENOUS
  Filled 2016-08-30: qty 50

## 2016-08-30 MED ORDER — FUROSEMIDE 40 MG PO TABS
60.0000 mg | ORAL_TABLET | Freq: Two times a day (BID) | ORAL | Status: DC
  Administered 2016-08-30 – 2016-08-31 (×2): 60 mg via ORAL
  Filled 2016-08-30 (×2): qty 1

## 2016-08-30 NOTE — Progress Notes (Signed)
PROGRESS NOTE  Johnathan [name]  SWN:[id number] DOB: Aug 19, 1970 DOA: 07/19/2016   PCP: [name], MD   Brief Narrative:  46 yo Male PMHx Hep C and IVDA, Polysubstance Abuse, presented with one week duration of fevers, redness and swelling on the dorsum of the right foot. Pt with ongoing use of opioids, injected Opana 2 days PTA. Pt admitted with right foot cellulitis as well as RLL infiltrate, found to have MSSA bacteremia with possible Meningitis. On 7/17, pt developed acute encephalopathy with agitation, combative requiring precedex drip.   Subjective: AAOX4, no fever, no CP and reports breathing is stable. CT with output in the 500 range still.   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 -due to cavitary PNA, septic emboli and aspiration  -PCCM following for effusion and management of chest tube -current chest tube output ~ 300-400 cc overnight/today; looking for less than 200 to be able to safely d/c CT -will closely observe while on lasix 27m BID and spironolactone 1524mBID -will follow clinical response  -if we can not control effusion medically, pt may be TIPS candidate as he has clinically improved and has not experience any further hemoptysis  -will follow up PCCM and GI rec's   MSSA and Pseudomonas bacteremia w/ sepsis/Tricuspid valve Endocarditis  - CSF culture negative - ceftazidime changed to 2 g IV every 8 hours, continue through 08/31/2016 as per ID plan. -afebrile and stable. -will continue IV antibiotics   Hepatitis C/positive Hep B - liver cirrhosis  - Patient currently not candidate for treatment secondary to drug abuse - will need outpatient follow up after proving  abstinence for 6 months   LUE Radial Vein DVT  - Seen on duplex 7/24.  -No PE on CTA 7/24 -no swelling appreciated   Acute Encephalopathy -resolved and has remained stable  -multifactorial in etiology -will continue monitoring and keeping reorientation     Acute Renal Failure  - Cr  3.17 on admission: Secondary to dehydration.  -significantly improved -Cr 1.46 now (last check 8/24) -will continue monitoring intermittently -Advise for to maintain adequate hydration  -will be extra vigilant as diuretic dose has been increased   Hypokalemia/hypomagnesemia -stable currently -probably associated with diuretics usage and overall poor nutrition  -will monitor trend and further replete as needed   Hypernatremia -resolved now -will monitor trend intermittently especially with diuretics adjustment    Drug abuse and dependence  -cessation counseling provided -patient considering keeping himself clean -will provide with outpatient resources to help him with that task  Protein calorie malnutrition -will follow nutritional service rec's -continue feeding supplements  -patient encourage to follow diet as instructed   Thrombocytopenia, anemia -no signs of bleeding -will continue monitoring trend   DVT prophylaxis: SCD Code Status: Full Family Communication: no family at bedside Disposition Plan: [facility] Will continue IV antibiotics and follow PCCM rec's for further chest tube decisions.  Consultants:  PCCM ID  GI  Procedures/Significant Events:  7/13 - Admit to [facility] 7/17 - Transfer to ICU for precedex infusion 7/21 - Precedex off  7/22 - TRH resumed care 7/24 - venous duplex L UE radial vein DVT 7/25 - PCCM signed off 8/02 -  Patient hypothermic & more agitated 8/03 - acute tachypnea w/ "guppy breathing" > emergently intubated w/ bronchoscopy showing "mild diffuse alveolar hemorrhage" 8/05 - Right chest tube inserted  w/ 350 mL serous fluid. Bicarb gtt started for acidosis 8/06 - Switched off Cefepime to [place] given potential for worsening encephalopathy  8/08 - Albumin w/ Lasix today. Improving UOP.  8/09 - Albumin w/ Lasix q6hr x3 doses. Precedex started in an attempt to wean Fentanyl drip.  8/11 - N/V of bilious emesis overnight & continued diuresis 8/12 - Continued diuresis w/ Lasix & added Aldactone daily. Patient apneic during SBT. Decreasing chest tube output. 8/13 - extubated  8/22-8/23--> adjusting/maximizing diuretic dose for cirrhosis and hydrothorax. Trying to remove CT if possible.  RENAL U/S 7/13:   IMPRESSION: 1. Mild right pelvicaliectasis, with ureteral jet noted in the urinary bladder excluding significant ureteral obstruction. 2. Small left renal cyst. 3. Pleural effusions and abdominal ascites. LP 7/13:  Tube 1 - WBC 51 (62% lymph, 29% lymph, 9% mono), RBC 555, Protein 88 , & Glucose 52. / Tube 4 - WBC 180 (58% neutro, 31% lymph, 11% monocytes) & RBC 36. TTE 7/14:  LV normal in size with EF 55-60%. No regional wall motion abnormalities & grade 1 diastolic dysfunction. LA upper limits of normal in size & RA normal in size. RV normal in size and function. No aortic stenosis or regurgitation. Aortic root normal in size. No mitral stenosis or regurgitation. No pulmonic stenosis or regurgitation with poorly visualized valve. No tricuspid regurgitation. Medium sized mobile vegetation noted on tricuspid valve. No pericardial effusion. CT HEAD W/O 7/16:  Progressive atrophy since 2011.  No acute intracranial findings. No abnormal enhancement or vasogenic edema to suggest septic emboli to the brain. VENOUS DUPLEX BILATERAL LOWER EXTREMITY 7/17:  No DVT or SVT.  CTA CHEST 7/24:  Negative for a pulmonary embolism. Extensive pleural-parenchymal lung disease bilaterally. Large right pleural effusion and moderate-sized left pleural effusion. Pleural-based nodular opacities could represent an infectious  etiology but also raise concern for a neoplastic process. Concern for a cavitary lesion and necrotic tissue in the right lung. Consider sampling of the pleural fluid. Recommend follow-up CT when the pleural fluid and acute process have resolved to exclude neoplastic disease. Severe emphysematous disease. Cirrhosis with evidence for portal hypertension based on the splenomegaly and ascites. 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  VENOUS DUPLEX LUE 7/24:  Acute DVT involving small portion of left radial vein in mid-forearm.  CT HEAD W/O 7/26:  Normal head CT. Right Pleural Effusion 7/26:  1.5 L hazy yellow fluid by IR. Protein <3.0, Albumin <1.0, Amylase 39, Glucose 72, LDH 72, & WBC 2431 (11% lymph, 82% neutro, & 7% mono). Cytology negative for malignancy.  MRI BRAIN W/O 8/2:  Exam is limited to diffusion only. Negative for acute infarct. No area of restricted diffusion. EEG 8/2:  This EEG is abnormal due to moderate diffuse slowing of the background. Lingula BAL 8/3:  WBC 78 (39% lymph, 31% neutro, 30% mono). Reported DAH.  TTE 8/3:  LV normal in size with EF 55-60%. No regional wall motion abnormality & grade 1 diastolic dysfunction. LA & RA normal in size. RV normal in size and function. No aortic stenosis or regurgitation. Aortic root normal in size. No mitral stenosis or regurgitation. No pulmonic stenosis. Trivial regurgitation. Trivial pericardial effusion as well. Notably no vegetations were seen on this exam. Right Pleural Effusion 8/5:  WBC 616 (1% lymph, 1% eos, &  98% neutro). PORT CXR 8/5:  Previously reviewed by me. Right-sided predominant opacities. Right-sided chest tube in place. Endotracheal tube in good position. Enteric feeding tube in good position. Right upper extremity PICC line in good position. Complete Abdominal U/S 8/6: Stable cirrhotic changes involving the liver without definite focal hepatic lesion. Mild common bile duct dilatation without obvious  common bile duct stone or intrahepatic biliary dilatation. Poor visualization of the pancreas. Increased echogenicity of both kidneys suggesting medical renal disease. No hydronephrosis. Large volume ascites and bilateral pleural effusions. Port CXR 8/7:  Previously reviewed by me. Hazy opacities bilateral lower lung zones. Endotracheal tube & enteric feeding tube in good position. Right chest tube in good position.  PORT CXR 8/11:  Personally reviewed by me. Patchy hazy opacities bilaterally which are improving. No appreciable residual pleural effusion on the right. Right-sided chest tube in good position. Endotracheal tube and right upper extremity central venous catheter in good position. Enteric feeding tube coursing below diaphragm. PORT ABD X-RAY 8/11: Paucity of bowel gas without evidence of enteric obstruction. Enteric tube tip and side port project over the expected location of the mid body of the stomach. CT abd / pelv 8/12 > persistent bilateral pleural effusion (though improved), 3cm irregular rounded density in RLL.  Cirrhosis with splenomegaly and ascites, anasarca. Inflammatory vs infectious process involving duodenum  Cultures Blood Cultures x2 7/12:  2/2 Bottles MSSA & 1/2 Bottles Pseudomonas aeruginosa  CSF Culture 7/13:  Negative  MRSA PCR 7/13:  Negative HIV 7/13:  Negative Respiratory Viral Panel PCR 7/13:  Negative  Urine Culture 7/13:  Multiple Species Present Blood Cultures x2 7/14:  1/2 Bottles MSSA Blood Culture x1 7/15:  Negative  MRSA PCR 7/17:  Negative  Urine Culture 7/17:  Negative  Urine Streptococcal Antigen 7/17:  Negative Urine Legionella Antigen 7/17:  Negative  Right Pleural Fluid Culture 7/26 >>> Bacteria Negative / AFB pending / Fungus pending Lingula BAL 8/3:  Candida tropicalis  Blood Cultures x2 8/3:  Negative  Right Pleural Culture 8/5:  Negative  Blood Culture x2 8/6:  Negative   Antimicrobials: Anti-infectives    Start     Stop   08/22/16 1100   rifaximin (XIFAXAN) tablet 550 mg         08/19/16 1000  cefTAZidime (FORTAZ) 1 g in dextrose 5 % 50 mL IVPB     08/31/16 2359   08/12/16 1100  cefTAZidime (FORTAZ) 2 g in dextrose 5 % 50 mL IVPB  Status:  Discontinued     08/19/16 0555   08/11/16 1000  ceFEPIme (MAXIPIME) 1 g in dextrose 5 % 50 mL IVPB  Status:  Discontinued     08/12/16 0936   08/10/16 1500  vancomycin (VANCOCIN) 1,250 mg in sodium chloride 0.9 % 250 mL IVPB  Status:  Discontinued     08/11/16 0930   08/10/16 1000  ceFEPIme (MAXIPIME) 2 g in dextrose 5 % 50 mL IVPB  Status:  Discontinued     08/11/16 0930   08/09/16 1300  vancomycin (VANCOCIN) 1,500 mg in sodium chloride 0.9 % 500 mL IVPB     08/09/16 1602   07/22/16 1400  ceFEPIme (MAXIPIME) 2 g in dextrose 5 % 50 mL IVPB  Status:  Discontinued     08/09/16 1226   07/20/16 1500  ceFEPIme (MAXIPIME) 2 g in dextrose 5 % 50 mL IVPB  Status:  Discontinued     07/22/16 1122   07/19/16 2300  vancomycin (VANCOCIN) IVPB 750 mg/150 ml  premix  Status:  Discontinued     07/19/16 0953   07/19/16 2200  ceFEPIme (MAXIPIME) 2 g in dextrose 5 % 50 mL IVPB  Status:  Discontinued     07/20/16 1410   07/19/16 2000  nafcillin 2 g in dextrose 5 % 100 mL IVPB  Status:  Discontinued     07/22/16 1130   07/19/16 1915  nafcillin injection 2 g  Status:  Discontinued     07/19/16 1915   07/19/16 1800  ceFEPIme (MAXIPIME) 2 g in dextrose 5 % 50 mL IVPB  Status:  Discontinued     07/19/16 1912   07/19/16 1100  vancomycin (VANCOCIN) 500 mg in sodium chloride 0.9 % 100 mL IVPB  Status:  Discontinued     07/19/16 1645   07/19/16 0800  piperacillin-tazobactam (ZOSYN) IVPB 3.375 g  Status:  Discontinued     07/19/16 1645   07/19/16 0200  cefTRIAXone (ROCEPHIN) 2 g in dextrose 5 % 50 mL IVPB     07/19/16 0259   07/19/16 0130  cefTRIAXone (ROCEPHIN) 1 g in dextrose 5 % 50 mL IVPB  Status:  Discontinued     07/19/16 0140   07/19/16 0115  piperacillin-tazobactam (ZOSYN) IVPB 3.375 g     07/19/16  0219   07/19/16 0115  vancomycin (VANCOCIN) IVPB 1000 mg/200 mL premix     07/19/16 0243     LINES / TUBES:  OETT 8/3 >>>8/13 Rt CHEST TUBE 8/5 >>> RUE DL PICC 7/27 >>>  Continuous Infusions: . cefTAZidime (FORTAZ)  IV Stopped (08/30/16 0939)  . dextrose     Objective: Vitals:   08/29/16 2032 08/30/16 0500 08/30/16 0515 08/30/16 1300  BP: (!) 150/89  (!) 151/90 (!) 161/94  Pulse: 74  84 82  Resp: _0 Temp: (!) 97.4 F (36.3 C)  97.9 F (36.6 C) 97.7 F (36.5 C)  TempSrc: Oral  Oral Oral  SpO2: 100%  100% 99%  Weight:  67.1 kg (147 lb 14.9 oz)    Height:        Intake/Output Summary (Last 24 hours) at 08/30/16 1438 Last data filed at 08/30/16 0900  Gross per 24 hour  Intake             1350 ml  Output             3751 ml  Net            -2401 ml   Filed Weights   08/28/16 0610 08/29/16 0500 08/30/16 0500  Weight: 72 kg (158 lb 11.7 oz) 71.8 kg (158 lb 4.6 oz) 67.1 kg (147 lb 14.9 oz)   Physical Exam  Constitutional: afebrile, no CP, no SOB, no nausea, no vomiting. Reported some improvement in appetite. AAOX4; CT still with increase output.  Rest of physical exam unchanged from 08/29/16; please see below for details. CVS: RRR, no rubs, no gallops, no murmur  Pulmonary: normal resp effort, no wheezing, decrease BS on right base and CT in right costal area in place; No using accessory muscles  Abdominal: soft, NT, ND, positive BS, no distension seen on exam.  Musculoskeletal: no joint swelling appreciated; FROM.   Psychiatric: AAOX3, no focal deficit appreciated. Patient report feeling weak and tired (generalized). Good insight.    Data Reviewed: I have reviewed patient's blood work today, vital signs, discussed with patient at bedside  CBC:  Recent Labs Lab 08/26/16 0426 08/27/16 0434 08/28/16 0446 08/29/16 0430 08/30/16 0309  WBC 7.2  7.6 8.7 6.0 6.5  NEUTROABS 4.5 4.0 4.5 2.9 2.9  HGB 8.1* 7.9* 8.9* 8.0* 8.0*  HCT 25.2* 24.6* 27.6* 25.1* 24.9*    MCV 88.7 87.2 87.1 86.6 86.2  PLT 117* 130* 173 157 779   Basic Metabolic Panel:  Recent Labs Lab 08/25/16 1714 08/26/16 0426 08/26/16 1758 08/27/16 0434 08/27/16 1623 08/28/16 0446 08/29/16 0430 08/30/16 0309  NA 135  136 138 138 139 139  --  136 138  K 3.3*  3.3* 3.6 4.1 4.2 4.3  --  4.0 4.0  CL 104  105 108 109 112* 112*  --  109 107  CO2 _0 --  22 23  GLUCOSE 87  89 81 80 75 88  --  84 76  BUN _1 --  12 12  CREATININE 1.65*  1.63* 1.54* 1.51* 1.51* 1.42*  --  1.41* 1.46*  CALCIUM 7.4*  7.3* 7.3* 7.5* 7.4* 7.7*  --  7.7* 7.8*  MG  --  1.8  --  1.4*  --  2.1 1.7 1.4*  PHOS 3.3 3.5 3.3 3.4 3.6  --   --   --     Liver Function Tests:  Recent Labs Lab 08/25/16 1714 08/26/16 0426 08/26/16 1758 08/27/16 0434 08/27/16 1623  ALBUMIN 1.4* 1.4* 1.4* 1.4* 1.5*   Coagulation Profile:  Recent Labs Lab 08/25/16 0355 08/26/16 0426 08/27/16 0434 08/28/16 0446 08/29/16 0430  INR 1.26 1.27 1.32 1.19 1.37   CBG: No results for input(s): GLUCAP in the last 168 hours. Urine analysis:    Component Value Date/Time   COLORURINE RED (A) 08/22/2016 1015   APPEARANCEUR CLOUDY (A) 08/22/2016 1015   LABSPEC 1.009 08/22/2016 1015   PHURINE 6.0 08/22/2016 1015   GLUCOSEU (A) 08/22/2016 1015    TEST NOT REPORTED DUE TO COLOR INTERFERENCE OF URINE PIGMENT   HGBUR (A) 08/22/2016 1015    TEST NOT REPORTED DUE TO COLOR INTERFERENCE OF URINE PIGMENT   BILIRUBINUR (A) 08/22/2016 1015    TEST NOT REPORTED DUE TO COLOR INTERFERENCE OF URINE PIGMENT   KETONESUR (A) 08/22/2016 1015    TEST NOT REPORTED DUE TO COLOR INTERFERENCE OF URINE PIGMENT   PROTEINUR (A) 08/22/2016 1015    TEST NOT REPORTED DUE TO COLOR INTERFERENCE OF URINE PIGMENT   NITRITE (A) 08/22/2016 1015    TEST NOT REPORTED DUE TO COLOR INTERFERENCE OF URINE PIGMENT   LEUKOCYTESUR (A) 08/22/2016 1015    TEST NOT REPORTED DUE TO COLOR INTERFERENCE OF URINE PIGMENT   Radiology  Studies: No results found.  Scheduled Meds: . Chlorhexidine Gluconate Cloth  6 each Topical Daily  . feeding supplement  1 Container Oral BID BM  . folic acid  1 mg Oral Daily  . furosemide  60 mg Oral Q12H  . Gerhardt's butt cream   Topical Daily  . hydrALAZINE  50 mg Oral Q8H  . lactulose  30 g Oral Daily  . mouth rinse  15 mL Mouth Rinse q12n4p  . multivitamin  1 tablet Oral Daily  . pantoprazole  40 mg Oral Daily  . rifaximin  550 mg Oral BID  . risperiDONE  0.5 mg Oral BID  . sodium chloride flush  10-40 mL Intracatheter Q12H  . sodium chloride flush  3 mL Intravenous Q12H  . spironolactone  150 mg Oral BID  . thiamine  100 mg Oral Daily   Continuous Infusions: . cefTAZidime (FORTAZ)  IV Stopped (08/30/16 0174)  . dextrose       LOS: 42 days   Time spent: 25 minutes   Barton Dubois MD  [facility] Pager [phone]  If 7PM-7AM, please contact night-coverage [email or web address] Password [facility] 08/30/2016, 2:38 PM

## 2016-08-30 NOTE — Progress Notes (Signed)
PULMONARY / CRITICAL CARE MEDICINE CONSULT   Name: Johnathan [name] MRN: [id number] DOB: [birth year]    ADMISSION DATE:  07/19/2016 CONSULTATION DATE:  07/23/2016  REFERRING MD:  Ree Kida  HISTORY OF PRESENT ILLNESS:  46 y.o. malew Hep C?, IVDA, (recently injected opana) apparently c/o fever intermittently for  1 week, and redness over the dorsum of the right foot and slight swelling. Pt notes injecting Opana about 2 days ago and cellulitis in right foot noted on admission 7/13 with labs showing EKG and abnormal LFTs with hyponatremia 123 and right lower lobe infiltrate on chest x-ray. Found to have MSSA bacteremia, possible meningitis, and LE cellulitis.  Echocardiogram Showed EF 14-23%, grade 1 diastolic dysfunction, tricuspid valve with mobile vegetation. On 7/17 He developed acute encephalopathy with agitation, becoming belligerent and combative. He denies ETOH abuse, but admitted to IVDA. He was placed on CIWA protocol, however, scores remained 19-23, patient was not responsive  to ativan, transferred to ICU for precedex gtt   SUBJECTIVE:  No sig changes  VITAL SIGNS: BP (!) 151/90 (BP Location: Left Arm)   Pulse 84   Temp 97.9 F (36.6 C) (Oral)   Resp 18   Ht _0  (1.803 m)   Wt 147 lb 14.9 oz (67.1 kg)   SpO2 100%   BMI 20.63 kg/m  Room air  INTAKE / OUTPUT:  Intake/Output Summary (Last 24 hours) at 08/30/16 0820 Last data filed at 08/30/16 0600  Gross per 24 hour  Intake             1030 ml  Output             3200 ml  Net            -2170 ml     PHYSICAL EXAMINATION: General appearance:  Frail 46 Year old  Male,cachectic  NAD,conversant  Eyes: anicteric sclerae, moist conjunctivae; PERRL, EOMI bilaterally. Mouth:  membranes and no mucosal ulcerations; normal hard and soft palate Neck: Trachea midline; neck supple, no JVD Lungs/chest: CTA, with normal respiratory effort and no intercostal retractions. Right chest tube w/ 558m clear straw colored output.   CV: RRR, no MRGs  Abdomen: Soft, non-tender; no masses or HSM Extremities: No peripheral edema or extremity lymphadenopathy Skin: Normal temperature, turgor and texture; no rash, ulcers or subcutaneous nodules Psych: Appropriate affect, alert and oriented to person, place and time   LABS:  BMET  Recent Labs Lab 08/27/16 1623 08/29/16 0430 08/30/16 0309  NA 139 136 138  K 4.3 4.0 4.0  CL 112* 109 107  CO2 _1 BUN _2 CREATININE 1.42* 1.41* 1.46*  GLUCOSE 88 84 76    Electrolytes  Recent Labs Lab 08/26/16 1758 08/27/16 0434 08/27/16 1623 08/28/16 0446 08/29/16 0430 08/30/16 0309  CALCIUM 7.5* 7.4* 7.7*  --  7.7* 7.8*  MG  --  1.4*  --  2.1 1.7 1.4*  PHOS 3.3 3.4 3.6  --   --   --     CBC  Recent Labs Lab 08/28/16 0446 08/29/16 0430 08/30/16 0309  WBC 8.7 6.0 6.5  HGB 8.9* 8.0* 8.0*  HCT 27.6* 25.1* 24.9*  PLT 173 157 174    Coag's  Recent Labs Lab 08/27/16 0434 08/28/16 0446 08/29/16 0430  APTT 70* 77* 60*  INR 1.32 1.19 1.37    Sepsis Markers No results for input(s): LATICACIDVEN, PROCALCITON, O2SATVEN in the last 168 hours.  ABG No results for input(s): PHART, PCO2ART, PO2ART in the  last 168 hours.  Liver Enzymes  Recent Labs Lab 08/26/16 1758 08/27/16 0434 08/27/16 1623  ALBUMIN 1.4* 1.4* 1.5*    Cardiac Enzymes No results for input(s): TROPONINI, PROBNP in the last 168 hours.  Glucose No results for input(s): GLUCAP in the last 168 hours.  Imaging No results found.  STUDIES:  RENAL U/S 7/13:   IMPRESSION: 1. Mild right pelvicaliectasis, with ureteral jet noted in the urinary bladder excluding significant ureteral obstruction. 2. Small left renal cyst. 3. Pleural effusions and abdominal ascites. LP 7/13:  Tube 1 - WBC 51 (62% lymph, 29% lymph, 9% mono), RBC 555, Protein 88 , & Glucose 52. / Tube 4 - WBC 180 (58% neutro, 31% lymph, 11% monocytes) & RBC 36. TTE 7/14:  LV normal in size with EF 55-60%. No  regional wall motion abnormalities & grade 1 diastolic dysfunction. LA upper limits of normal in size & RA normal in size. RV normal in size and function. No aortic stenosis or regurgitation. Aortic root normal in size. No mitral stenosis or regurgitation. No pulmonic stenosis or regurgitation with poorly visualized valve. No tricuspid regurgitation. Medium sized mobile vegetation noted on tricuspid valve. No pericardial effusion. CT HEAD W/O 7/16:  Progressive atrophy since 2011.  No acute intracranial findings. No abnormal enhancement or vasogenic edema to suggest septic emboli to the brain. VENOUS DUPLEX BILATERAL LOWER EXTREMITY 7/17:  No DVT or SVT.  CTA CHEST 7/24:   IMPRESSION: 1. Negative for a pulmonary embolism. 2. Extensive pleural-parenchymal lung disease bilaterally. Large right pleural effusion and moderate-sized left pleural effusion. Pleural-based nodular opacities could represent an infectious etiology but also raise concern for a neoplastic process. Concern for a cavitary lesion and necrotic tissue in the right lung. Consider sampling of the pleural fluid. Recommend follow-up CT when the pleural fluid and acute process have resolved to exclude neoplastic disease. 3. Severe emphysematous disease. 4. Cirrhosis with evidence for portal hypertension based on the splenomegaly and ascites. 5. 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 Recommend annual imaging followup by CTA or MRA. This recommendation follows 2010 ACCF/AHA/AATS/ACR/ASA/SCA/SCAI/SIR/STS/SVM Guidelines for the Diagnosis and Management of Patients with Thoracic Aortic Disease. Circulation. 2010; 121: D326-Z124 VENOUS DUPLEX LUE 7/24:  Acute DVT involving small portion of left radial vein in mid-forearm.  CT HEAD W/O 7/26:  Normal head CT. Right Pleural Effusion 7/26:  1.5 L hazy yellow fluid by IR. Protein <3.0, Albumin <1.0, Amylase 39, Glucose 72, LDH 72, & WBC 2431 (11% lymph, 82% neutro, & 7% mono).  Cytology negative for malignancy.  MRI BRAIN W/O 8/2:  Exam is limited to diffusion only. Negative for acute infarct. No area of restricted diffusion. EEG 8/2:  This EEG is abnormal due to moderate diffuse slowing of the background. Lingula BAL 8/3:  WBC 78 (39% lymph, 31% neutro, 30% mono). Reported DAH.  TTE 8/3:  LV normal in size with EF 55-60%. No regional wall motion abnormality & grade 1 diastolic dysfunction. LA & RA normal in size. RV normal in size and function. No aortic stenosis or regurgitation. Aortic root normal in size. No mitral stenosis or regurgitation. No pulmonic stenosis. Trivial regurgitation. Trivial pericardial effusion as well. Notably no vegetations were seen on this exam. Right Pleural Effusion 8/5:  WBC 616 (1% lymph, 1% eos, & 98% neutro). PORT CXR 8/5:  Previously reviewed by me. Right-sided predominant opacities. Right-sided chest tube in place. Endotracheal tube in good position. Enteric feeding tube in good position. Right upper extremity  PICC line in good position. Complete Abdominal U/S 8/6: IMPRESSION: 1. Stable cirrhotic changes involving the liver without definite focal hepatic lesion. 2. Mild common bile duct dilatation without obvious common bile duct stone or intrahepatic biliary dilatation. 3. Poor visualization of the pancreas. 4. Increased echogenicity of both kidneys suggesting medical renal disease. No hydronephrosis. 5. Large volume ascites and bilateral pleural effusions. Port CXR 8/7:  Previously reviewed by me. Hazy opacities bilateral lower lung zones. Endotracheal tube & enteric feeding tube in good position. Right chest tube in good position.  PORT CXR 8/11:  Personally reviewed by me. Patchy hazy opacities bilaterally which are improving. No appreciable residual pleural effusion on the right. Right-sided chest tube in good position. Endotracheal tube and right upper extremity central venous catheter in good position. Enteric feeding tube coursing  below diaphragm. PORT ABD X-RAY 8/11: IMPRESSION: 1. Paucity of bowel gas without evidence of enteric obstruction. 2. Enteric tube tip and side port project over the expected location of the mid body of the stomach. CT abd / pelv 8/12 > persistent bilateral pleural effusion (though improved), 3cm irregular rounded density in RLL.  Cirrhosis with splenomegaly and ascites, anasarca. Inflammatory vs infectious process involving duodenum.  MICROBIOLOGY: Blood Cultures x2 7/12:  2/2 Bottles MSSA & 1/2 Bottles Pseudomonas aeruginosa  CSF Culture 7/13:  Negative  MRSA PCR 7/13:  Negative HIV 7/13:  Negative Respiratory Viral Panel PCR 7/13:  Negative  Urine Culture 7/13:  Multiple Species Present Blood Cultures x2 7/14:  1/2 Bottles MSSA Blood Culture x1 7/15:  Negative  MRSA PCR 7/17:  Negative  Urine Culture 7/17:  Negative  Urine Streptococcal Antigen 7/17:  Negative Urine Legionella Antigen 7/17:  Negative  Right Pleural Fluid Culture 7/26 >>> Bacteria Negative / AFB pending / Fungus pending Lingula BAL 8/3:  Candida tropicalis  Blood Cultures x2 8/3:  Negative  Right Pleural Culture 8/5:  Negative  Blood Culture x2 8/6:  Negative   ANTIBIOTICS: Ceftriaxone 7/13 (x1 dose) Zosyn 7/13 (x1 dose) Nafcillin 7/13 - 7/16 Cefepime 7/13 - 8/6 Vancomycin 7/13 (x1 dose); restarted 8/3 - 8/7 Tressie Ellis 8/6 >>> (plan to continue through 8/25 per ID recs)  SIGNIFICANT EVENTS: 7/17 - Transfer to ICU for precedex infusion 7/21 - Precedex off  7/25 - PCCM signed off 8/02 - Patient hypothermic & more agitated 8/03 - acute tachypnea w/ "guppy breathing" >> emergently intubated w/ bronchoscopy showing "mild diffuse alveolar hemorrhage" 8/05 - Right chest tube inserted w/ 350 mL serous fluid. Bicarb gtt started for acidosis 8/06 - Switched off Cefepime to [place] given potential for worsening encephalopathy  8/08 - Albumin w/ Lasix today. Improving UOP.  8/09 - Albumin w/ Lasix q6hr x3 doses. Precedex  started in an attempt to wean Fentanyl drip.  8/11 - N/V of bilious emesis overnight & continued diuresis 8/12 - Continued diuresis w/ Lasix & added Aldactone daily. Patient apneic during SBT. Decreasing chest tube output. 8/16 diuretics backed down d/t hypernatremia   LINES/TUBES: OETT 8/3 >>>8/13 R CHEST TUBE 8/5 >>> RUE DL PICC 7/27 >>> Foley >>>  ASSESSMENT / PLAN: Treated Issues:  Acute hypoxic respiratory failure - Multifactorial.  Extubated 8/13 and tolerated well. Hemoptysis - Likely due to cavitary pneumonia. Improving. No bright red blood. 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  MSSA & Pseudomonas Bacteremia/Endocarditis LUE Radial Vein DVT Residual ALI  Right Lower Extremity Cellulitis - Resolved.  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 Currently on fortaz through 8/25 per ID Transudative Right Pleural Effusion - S/P Chest tube placement. 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 Fluid and electrolyte imbalance  AKI improving/stable  Discussion Interval from 8/17 to 8/23 Adjusting diuretics since 8/21; increased lasix and aldactone last on 8/23. See GI consult note from Dr Ardis Hughs on 8/23. His Chest tube still put out 56m over the last 24 hours. To date his creatinine is stable.  According to UBurnis Medin The Rate of Pleural Fluid Drainage as a Criterion for the Timing of Chest Tube Removal: Theoretical and Practical Considerations; The Annals of thoracic surgery 2006; 96:  2262-2267: " Absent such tubes, pleural fluid drains primarily through parietal pleural lymphatics at rates approaching 500 mL of fluid per day or more for each hemithorax".  There is really no consensus about optimal output for safe chest tube removal but a  conservative rate would be as little as 262mkg (UDrake LeachG; 2006). Which would be only ~130 ml/24 hrs in this patient which I doubt is obtainable. Other RCTs seem to settle around a 20077m4 hour threshold but according to Yap, K et al. The safe volume threshold for chest drain removal following pulmonary resection. IntFort Paynerg. 2017 Nov 1;25(5):822-826. At least in patients following resection safe levels for chest tube removals range from 250-500m28m hrs. It would be difficult however to know how this population correlates with our patient. Given the patients renal fxn, pleural lymphatic physiology and current lack of good consensus a fair goal would be < 300 ml in 24 hr period.   Plan Will go ahead and maximize spironolactone and lasix-->See what this does to his output over the weekend Goal <300 ml/24 hrs. According to literature review this may be enough to allow safer removal (see discussion above); however there would be no guarantee he wouldn't require repeat thoracentesis OR eventually TIPS.  IF still no improvement AND/OR renal fxn starts to be more compromised we will need to give consideration to TIPS earlier   PeteErick ColaceP-BC [place] # [phone]# [phone] answer   STAFF NOTE: I, DaniMerrie Roof FACP have personally reviewed patient's available data, including medical history, events of note, physical examination and test results as part of my evaluation. I have discussed with resident/NP and other care providers such as pharmacist, RN and RRT. In addition, I personally evaluated patient and elicited key findings of: awake, alert, no distress at rest, less coarse BS, no edema, low muscle mass, pcxr I reviewed shows well placed chest tube small small tiny apical PTX, and improve edema on rt lung, he was neg 2 liters after restart of more aggressive diuresis, his output from chest ube was 500 cc, I would favor continued  neg balance overall and re assess output over the weekend, in hopes that it will reduce further, since is hydroptx  Will have to have more realistic output goals as may stay elevated to some degree but hope can improve from 500 cc in a day to 300 or lower, no role suction with this tiny space apical, will see Monday, repeat pcxr monday  DaniLavon PaganiniinTitus Mould, FACPAlpena: [phone] 08/30/2016 10:30 AM

## 2016-08-30 NOTE — Progress Notes (Signed)
[facility] Progress Note    Since last GI note: Oral diuretics adjusted yesterday: currently on lasix 60BID, aldactone 150BID  Chest tube output 500 cc yesterday, somewhat reduced from 650 >> 550 >> 370 >> 510 >> 500 over recent days.   Appetite improved.  Eating 75 to 100% of meals in last 6 days.  Objective: Vital signs in last 24 hours: Temp:  [97.4 F (36.3 C)-97.9 F (36.6 C)] 97.9 F (36.6 C) (08/24 0515) Pulse Rate:  [74-84] 84 (08/24 0515) Resp:  [18] 18 (08/24 0515) BP: (140-151)/(88-90) 151/90 (08/24 0515) SpO2:  [99 %-100 %] 100 % (08/24 0515) Weight:  [147 lb 14.9 oz (67.1 kg)] 147 lb 14.9 oz (67.1 kg) (08/24 0500) Last BM Date: 08/29/16 General: alert and oriented times 3 Heart: regular rate and rythm Abdomen: soft, non-tender, non-distended, normal bowel sounds   Lab Results:  Recent Labs  08/28/16 0446 08/29/16 0430 08/30/16 0309  WBC 8.7 6.0 6.5  HGB 8.9* 8.0* 8.0*  PLT 173 157 174  MCV 87.1 86.6 86.2    Recent Labs  08/27/16 1623 08/29/16 0430 08/30/16 0309  NA 139 136 138  K 4.3 4.0 4.0  CL 112* 109 107  CO2 22 22 23   GLUCOSE 88 84 76  BUN 13 12 12   CREATININE 1.42* 1.41* 1.46*  CALCIUM 7.7* 7.7* 7.8*    Recent Labs  08/27/16 1623  ALBUMIN 1.5*    Recent Labs  08/28/16 0446 08/29/16 0430  INR 1.19 1.37    Medications: Scheduled Meds: . Chlorhexidine Gluconate Cloth  6 each Topical Daily  . feeding supplement  1 Container Oral BID BM  . folic acid  1 mg Oral Daily  . furosemide  60 mg Oral Q12H  . Gerhardt's butt cream   Topical Daily  . hydrALAZINE  50 mg Oral Q8H  . lactulose  30 g Oral Daily  . mouth rinse  15 mL Mouth Rinse q12n4p  . multivitamin  1 tablet Oral Daily  . pantoprazole  40 mg Oral Daily  . rifaximin  550 mg Oral BID  . risperiDONE  0.5 mg Oral BID  . sodium chloride flush  10-40 mL Intracatheter Q12H  . sodium chloride flush  3 mL Intravenous Q12H  . spironolactone  150 mg Oral BID  .  thiamine  100 mg Oral Daily   Continuous Infusions: . cefTAZidime (FORTAZ)  IV Stopped (08/30/16 0939)  . dextrose    . magnesium sulfate 1 - 4 g bolus IVPB 2 g (08/30/16 0939)   PRN Meds:.albuterol, dextrose, haloperidol lactate, ipratropium, labetalol, ondansetron (ZOFRAN) IV, sodium chloride flush, sodium chloride flush    Assessment/Plan: 46 y.o. male with cirrhosis (hep B/C coinfection per patient report), transudated pleural fluid that is limiting removal of chest tube  Likely hepatic hydrothorax, although it is interesting that he was never really bothered by ascites or lower extremity edema in the past.  CT abd/pelvis 2 weeks ago did show moderate ascites.  It is quite common for cirrhosis, liver function to deteriorate in setting of an acute illness (such as his endocarditis with septic emboli).   Adjusted his diuretics upward yesterday and change to low salt diet; Will see if this helps with chest tube output over the next couple days. Will need to check BMET daily for electrolyte, renal issues on the higher diuretic doses.   [facility] will return on Monday to check on his progress. Please call or page me over the weekend with any questions  or concerns.   Rachael Fee, MD  08/30/2016, 10:20 AM [facility] Pager [phone]

## 2016-08-31 LAB — BASIC METABOLIC PANEL
ANION GAP: 7 (ref 5–15)
BUN: 13 mg/dL (ref 6–20)
CALCIUM: 7.6 mg/dL — AB (ref 8.9–10.3)
CO2: 25 mmol/L (ref 22–32)
CREATININE: 1.59 mg/dL — AB (ref 0.61–1.24)
Chloride: 105 mmol/L (ref 101–111)
GFR calc Af Amer: 59 mL/min — ABNORMAL LOW (ref >60)
GFR calc non Af Amer: 51 mL/min — ABNORMAL LOW (ref >60)
Glucose, Bld: 90 mg/dL (ref 65–99)
Potassium: 3.8 mmol/L (ref 3.5–5.1)
Sodium: 137 mmol/L (ref 135–145)

## 2016-08-31 LAB — CBC WITH DIFFERENTIAL/PLATELET
Basophils Absolute: 0 10*3/uL (ref 0.0–0.1)
Basophils Relative: 0 %
EOS ABS: 0.2 10*3/uL (ref 0.0–0.7)
EOS PCT: 3 %
HCT: 24 % — ABNORMAL LOW (ref 39.0–52.0)
Hemoglobin: 7.8 g/dL — ABNORMAL LOW (ref 13.0–17.0)
LYMPHS ABS: 2.4 10*3/uL (ref 0.7–4.0)
LYMPHS PCT: 41 %
MCH: 28 pg (ref 26.0–34.0)
MCHC: 32.5 g/dL (ref 30.0–36.0)
MCV: 86 fL (ref 78.0–100.0)
MONO ABS: 0.7 10*3/uL (ref 0.1–1.0)
MONOS PCT: 12 %
Neutro Abs: 2.5 10*3/uL (ref 1.7–7.7)
Neutrophils Relative %: 44 %
PLATELETS: 166 10*3/uL (ref 150–400)
RBC: 2.79 MIL/uL — ABNORMAL LOW (ref 4.22–5.81)
RDW: 17.5 % — AB (ref 11.5–15.5)
WBC: 5.9 10*3/uL (ref 4.0–10.5)

## 2016-08-31 LAB — MAGNESIUM: Magnesium: 1.5 mg/dL — ABNORMAL LOW (ref 1.7–2.4)

## 2016-08-31 MED ORDER — MORPHINE SULFATE (PF) 4 MG/ML IV SOLN
1.0000 mg | INTRAVENOUS | Status: DC | PRN
  Administered 2016-08-31 – 2016-09-03 (×13): 1 mg via INTRAVENOUS
  Filled 2016-08-31 (×13): qty 1

## 2016-08-31 MED ORDER — HYDROCODONE-ACETAMINOPHEN 5-325 MG PO TABS
1.0000 | ORAL_TABLET | Freq: Once | ORAL | Status: AC
  Administered 2016-08-31: 2 via ORAL
  Filled 2016-08-31: qty 2

## 2016-08-31 MED ORDER — MAGNESIUM SULFATE 2 GM/50ML IV SOLN
2.0000 g | Freq: Once | INTRAVENOUS | Status: AC
  Administered 2016-08-31: 2 g via INTRAVENOUS
  Filled 2016-08-31: qty 50

## 2016-08-31 MED ORDER — FUROSEMIDE 40 MG PO TABS
50.0000 mg | ORAL_TABLET | Freq: Two times a day (BID) | ORAL | Status: DC
  Administered 2016-08-31 – 2016-09-03 (×6): 50 mg via ORAL
  Filled 2016-08-31 (×8): qty 1

## 2016-08-31 MED ORDER — ACETAMINOPHEN 325 MG PO TABS
650.0000 mg | ORAL_TABLET | Freq: Four times a day (QID) | ORAL | Status: DC | PRN
  Administered 2016-08-31 – 2016-09-01 (×3): 650 mg via ORAL
  Filled 2016-08-31 (×3): qty 2

## 2016-08-31 MED ORDER — SPIRONOLACTONE 25 MG PO TABS
125.0000 mg | ORAL_TABLET | Freq: Two times a day (BID) | ORAL | Status: DC
  Administered 2016-08-31 – 2016-09-03 (×6): 125 mg via ORAL
  Filled 2016-08-31 (×6): qty 1

## 2016-08-31 NOTE — Progress Notes (Signed)
PROGRESS NOTE  Johnathan [name]  OVF:[id number] DOB: July 01, 1970 DOA: 07/19/2016   PCP: [name], MD   Brief Narrative:  46 yo Male PMHx Hep C and IVDA, Polysubstance Abuse, presented with one week duration of fevers, redness and swelling on the dorsum of the right foot. Pt with ongoing use of opioids, injected Opana 2 days PTA. Pt admitted with right foot cellulitis as well as RLL infiltrate, found to have MSSA bacteremia with possible Meningitis. On 7/17, pt developed acute encephalopathy with agitation, combative requiring precedex drip.   Subjective: AAOX4, no fever, no SOB. No nausea or vomiting. Reporting chest pain in the area where ches tube is located.  Assessment & Plan:  RLL cavitary PNA /- septic emboli v/s aspiration -afebrile and with good O2 sat on RA -denies CP and SOB -continue current antibiotics therapy (last dose anticipated today). Patient remained afebrile and with good oxygen sat. 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 Continue pulmonary hygiene  -PCCM following for effusion and management of chest tube -CT with drastic reduction, down to 250 ml in the last 24 hours. Will continue to monitor.  -renal function/Cr going up; will adjust slightly lasix and spironolactone; continue monitoring renal function. -if we can not control effusion medically, pt may be TIPS candidate as he has clinically improved and has not experience any further hemoptysis  -will follow up PCCM and GI rec's   MSSA and Pseudomonas bacteremia w/ sepsis/Tricuspid valve Endocarditis  -CSF culture negative -afebrile and w/o signs or symptoms for infection -will continue ceftazidime (last dose of abx's today) -will follow clinical response   Hepatitis C/positive Hep B - liver cirrhosis  - Patient currently not candidate for treatment secondary  to drug abuse - will need outpatient follow up after proving abstinence for 6 months   LUE Radial Vein DVT  - Seen on duplex 7/24.  -No PE on CTA 7/24 -no swelling appreciated   Acute Encephalopathy -resolved and has remained stable  -multifactorial in etiology -will continue monitoring and keeping reorientation     Acute Renal Failure  - Cr  3.17 on admission: Secondary to dehydration.  -Cr 1.59 today (going up) -most likely from acute high doses of diuretics -will decrease lasix and spironolactone dose; follow renal function.  Hypokalemia/hypomagnesemia -potassium stable -Magnesium slightly low today  -will monitor trend and replete further as needed   Hypernatremia -resolved now -will continue to monitor trend -maintaining adequate hydration and educated about low sodium diet.    Drug abuse and dependence  -cessation counseling provided -patient considering keeping himself clean -will provide with outpatient resources to help him with that task  Protein calorie malnutrition -will follow nutritional service rec's -continue feeding supplements  -patient encourage to follow diet as instructed   Thrombocytopenia, anemia -no signs of bleeding -continue monitoring trend -most likely associated with cirrhosis    DVT prophylaxis: SCD Code Status: Full Family Communication: no family at bedside Disposition Plan: pt with no insurance so can not go to [facility] or SNF apparently. Finishing antibiotics today. Significant reduction seen on CT drainage. Also with increase in Cr; will follow renal function, slightly decrease diuretic dose and replete electrolytes.   Consultants:  PCCM ID  GI  Procedures/Significant Events:  7/13 - Admit to [facility] 7/17 - Transfer to ICU for precedex infusion 7/21 - Precedex off  7/22 - [facility] resumed care 7/24 - venous duplex  L UE radial vein DVT 7/25 - PCCM signed off 8/02 - Patient hypothermic & more agitated 8/03 - acute tachypnea w/ "guppy  breathing" > emergently intubated w/ bronchoscopy showing "mild diffuse alveolar hemorrhage" 8/05 - Right chest tube inserted w/ 350 mL serous fluid. Bicarb gtt started for acidosis 8/06 - Switched off Cefepime to [place] given potential for worsening encephalopathy  8/08 - Albumin w/ Lasix today. Improving UOP.  8/09 - Albumin w/ Lasix q6hr x3 doses. Precedex started in an attempt to wean Fentanyl drip.  8/11 - N/V of bilious emesis overnight & continued diuresis 8/12 - Continued diuresis w/ Lasix & added Aldactone daily. Patient apneic during SBT. Decreasing chest tube output. 8/13 - extubated  8/22-8/23--> adjusting/maximizing diuretic dose for cirrhosis and hydrothorax. Trying to remove CT if possible.  RENAL U/S 7/13:   IMPRESSION: 1. Mild right pelvicaliectasis, with ureteral jet noted in the urinary bladder excluding significant ureteral obstruction. 2. Small left renal cyst. 3. Pleural effusions and abdominal ascites. LP 7/13:  Tube 1 - WBC 51 (62% lymph, 29% lymph, 9% mono), RBC 555, Protein 88 , & Glucose 52. / Tube 4 - WBC 180 (58% neutro, 31% lymph, 11% monocytes) & RBC 36. TTE 7/14:  LV normal in size with EF 55-60%. No regional wall motion abnormalities & grade 1 diastolic dysfunction. LA upper limits of normal in size & RA normal in size. RV normal in size and function. No aortic stenosis or regurgitation. Aortic root normal in size. No mitral stenosis or regurgitation. No pulmonic stenosis or regurgitation with poorly visualized valve. No tricuspid regurgitation. Medium sized mobile vegetation noted on tricuspid valve. No pericardial effusion. CT HEAD W/O 7/16:  Progressive atrophy since 2011.  No acute intracranial findings. No abnormal enhancement or vasogenic edema to suggest septic emboli to the brain. VENOUS DUPLEX BILATERAL LOWER EXTREMITY 7/17:  No DVT or SVT.  CTA CHEST 7/24:  Negative for a pulmonary embolism. Extensive pleural-parenchymal lung disease bilaterally. Large  right pleural effusion and moderate-sized left pleural effusion. Pleural-based nodular opacities could represent an infectious etiology but also raise concern for a neoplastic process. Concern for a cavitary lesion and necrotic tissue in the right lung. Consider sampling of the pleural fluid. Recommend follow-up CT when the pleural fluid and acute process have resolved to exclude neoplastic disease. Severe emphysematous disease. Cirrhosis with evidence for portal hypertension based on the splenomegaly and ascites. 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  VENOUS DUPLEX LUE 7/24:  Acute DVT involving small portion of left radial vein in mid-forearm.  CT HEAD W/O 7/26:  Normal head CT. Right Pleural Effusion 7/26:  1.5 L hazy yellow fluid by IR. Protein <3.0, Albumin <1.0, Amylase 39, Glucose 72, LDH 72, & WBC 2431 (11% lymph, 82% neutro, & 7% mono). Cytology negative for malignancy.  MRI BRAIN W/O 8/2:  Exam is limited to diffusion only. Negative for acute infarct. No area of restricted diffusion. EEG 8/2:  This EEG is abnormal due to moderate diffuse slowing of the background. Lingula BAL 8/3:  WBC 78 (39% lymph, 31% neutro, 30% mono). Reported DAH.  TTE 8/3:  LV normal in size with EF 55-60%. No regional wall motion abnormality & grade 1 diastolic dysfunction. LA & RA normal in size. RV normal in size and function. No aortic stenosis or regurgitation. Aortic root normal in size. No mitral stenosis or regurgitation. No pulmonic stenosis. Trivial regurgitation. Trivial pericardial effusion as well. Notably no vegetations were seen on this  exam. Right Pleural Effusion 8/5:  WBC 616 (1% lymph, 1% eos, & 98% neutro). PORT CXR 8/5:  Previously reviewed by me. Right-sided predominant opacities. Right-sided chest tube in place. Endotracheal tube in good position. Enteric feeding tube in good position. Right upper extremity PICC line in good position. Complete Abdominal U/S 8/6: Stable  cirrhotic changes involving the liver without definite focal hepatic lesion. Mild common bile duct dilatation without obvious common bile duct stone or intrahepatic biliary dilatation. Poor visualization of the pancreas. Increased echogenicity of both kidneys suggesting medical renal disease. No hydronephrosis. Large volume ascites and bilateral pleural effusions. Port CXR 8/7:  Previously reviewed by me. Hazy opacities bilateral lower lung zones. Endotracheal tube & enteric feeding tube in good position. Right chest tube in good position.  PORT CXR 8/11:  Personally reviewed by me. Patchy hazy opacities bilaterally which are improving. No appreciable residual pleural effusion on the right. Right-sided chest tube in good position. Endotracheal tube and right upper extremity central venous catheter in good position. Enteric feeding tube coursing below diaphragm. PORT ABD X-RAY 8/11: Paucity of bowel gas without evidence of enteric obstruction. Enteric tube tip and side port project over the expected location of the mid body of the stomach. CT abd / pelv 8/12 > persistent bilateral pleural effusion (though improved), 3cm irregular rounded density in RLL.  Cirrhosis with splenomegaly and ascites, anasarca. Inflammatory vs infectious process involving duodenum  Cultures Blood Cultures x2 7/12:  2/2 Bottles MSSA & 1/2 Bottles Pseudomonas aeruginosa  CSF Culture 7/13:  Negative  MRSA PCR 7/13:  Negative HIV 7/13:  Negative Respiratory Viral Panel PCR 7/13:  Negative  Urine Culture 7/13:  Multiple Species Present Blood Cultures x2 7/14:  1/2 Bottles MSSA Blood Culture x1 7/15:  Negative  MRSA PCR 7/17:  Negative  Urine Culture 7/17:  Negative  Urine Streptococcal Antigen 7/17:  Negative Urine Legionella Antigen 7/17:  Negative  Right Pleural Fluid Culture 7/26 >>> Bacteria Negative / AFB pending / Fungus pending Lingula BAL 8/3:  Candida tropicalis  Blood Cultures x2 8/3:  Negative  Right Pleural  Culture 8/5:  Negative  Blood Culture x2 8/6:  Negative   Antimicrobials: Anti-infectives    Start     Stop   08/22/16 1100  rifaximin (XIFAXAN) tablet 550 mg         08/19/16 1000  cefTAZidime (FORTAZ) 1 g in dextrose 5 % 50 mL IVPB     08/31/16 2359   08/12/16 1100  cefTAZidime (FORTAZ) 2 g in dextrose 5 % 50 mL IVPB  Status:  Discontinued     08/19/16 0555   08/11/16 1000  ceFEPIme (MAXIPIME) 1 g in dextrose 5 % 50 mL IVPB  Status:  Discontinued     08/12/16 0936   08/10/16 1500  vancomycin (VANCOCIN) 1,250 mg in sodium chloride 0.9 % 250 mL IVPB  Status:  Discontinued     08/11/16 0930   08/10/16 1000  ceFEPIme (MAXIPIME) 2 g in dextrose 5 % 50 mL IVPB  Status:  Discontinued     08/11/16 0930   08/09/16 1300  vancomycin (VANCOCIN) 1,500 mg in sodium chloride 0.9 % 500 mL IVPB     08/09/16 1602   07/22/16 1400  ceFEPIme (MAXIPIME) 2 g in dextrose 5 % 50 mL IVPB  Status:  Discontinued     08/09/16 1226   07/20/16 1500  ceFEPIme (MAXIPIME) 2 g in dextrose 5 % 50 mL IVPB  Status:  Discontinued  07/22/16 1122   07/19/16 2300  vancomycin (VANCOCIN) IVPB 750 mg/150 ml premix  Status:  Discontinued     07/19/16 0953   07/19/16 2200  ceFEPIme (MAXIPIME) 2 g in dextrose 5 % 50 mL IVPB  Status:  Discontinued     07/20/16 1410   07/19/16 2000  nafcillin 2 g in dextrose 5 % 100 mL IVPB  Status:  Discontinued     07/22/16 1130   07/19/16 1915  nafcillin injection 2 g  Status:  Discontinued     07/19/16 1915   07/19/16 1800  ceFEPIme (MAXIPIME) 2 g in dextrose 5 % 50 mL IVPB  Status:  Discontinued     07/19/16 1912   07/19/16 1100  vancomycin (VANCOCIN) 500 mg in sodium chloride 0.9 % 100 mL IVPB  Status:  Discontinued     07/19/16 1645   07/19/16 0800  piperacillin-tazobactam (ZOSYN) IVPB 3.375 g  Status:  Discontinued     07/19/16 1645   07/19/16 0200  cefTRIAXone (ROCEPHIN) 2 g in dextrose 5 % 50 mL IVPB     07/19/16 0259   07/19/16 0130  cefTRIAXone (ROCEPHIN) 1 g in dextrose 5 %  50 mL IVPB  Status:  Discontinued     07/19/16 0140   07/19/16 0115  piperacillin-tazobactam (ZOSYN) IVPB 3.375 g     07/19/16 0219   07/19/16 0115  vancomycin (VANCOCIN) IVPB 1000 mg/200 mL premix     07/19/16 0243     LINES / TUBES:  OETT 8/3 >>>8/13 Rt CHEST TUBE 8/5 >>> RUE DL PICC 7/27 >>>  Continuous Infusions: . cefTAZidime (FORTAZ)  IV Stopped (08/31/16 0926)  . magnesium sulfate 1 - 4 g bolus IVPB     Objective: Vitals:   08/30/16 1300 08/30/16 2045 08/31/16 0500 08/31/16 0541  BP: (!) 161/94 (!) 138/92  (!) 141/89  Pulse: 82 95  86  Resp: _0 Temp: 97.7 F (36.5 C) 98.4 F (36.9 C)  98.5 F (36.9 C)  TempSrc: Oral Oral  Oral  SpO2: 99% 100%  99%  Weight:   66.8 kg (147 lb 4.3 oz)   Height:        Intake/Output Summary (Last 24 hours) at 08/31/16 1314 Last data filed at 08/31/16 1107  Gross per 24 hour  Intake             1012 ml  Output             2050 ml  Net            -1038 ml   Filed Weights   08/29/16 0500 08/30/16 0500 08/31/16 0500  Weight: 71.8 kg (158 lb 4.6 oz) 67.1 kg (147 lb 14.9 oz) 66.8 kg (147 lb 4.3 oz)   Physical Exam  Constitutional: afebrile, no SOB, no nausea, no vomiting. Patient breathing comfortable. Reporting pain in right side today (feeling as if chest tube is pulling on him. AAOX4. CT with significant reduction in its output, down to 250 ml in 24 hours. CVS: RRR, no rubs, no gallops, no murmur, no JVD  Pulmonary: decrease BS at right base, no wheezing, no crackles. No using accessory muscles.  Abdominal: soft, NT, ND, positive BS, no ascites appreciated.  Musculoskeletal: no joint swelling, no erythema, no cyanosis    Psychiatric: AAOX4, no focal neurologic deficit, good insight and following commands properly.     Data Reviewed: I have reviewed patient's blood work today, vital signs, discussed with patient at bedside  CBC:  Recent Labs Lab 08/27/16 0434 08/28/16 0446 08/29/16 0430 08/30/16 0309 08/31/16 0445    WBC 7.6 8.7 6.0 6.5 5.9  NEUTROABS 4.0 4.5 2.9 2.9 2.5  HGB 7.9* 8.9* 8.0* 8.0* 7.8*  HCT 24.6* 27.6* 25.1* 24.9* 24.0*  MCV 87.2 87.1 86.6 86.2 86.0  PLT 130* 173 157 174 841   Basic Metabolic Panel:  Recent Labs Lab 08/25/16 1714 08/26/16 0426 08/26/16 1758 08/27/16 0434 08/27/16 1623 08/28/16 0446 08/29/16 0430 08/30/16 0309 08/31/16 0445  NA 135  136 138 138 139 139  --  136 138 137  K 3.3*  3.3* 3.6 4.1 4.2 4.3  --  4.0 4.0 3.8  CL 104  105 108 109 112* 112*  --  109 107 105  CO2 _0 --  _1 GLUCOSE 87  89 81 80 75 88  --  84 76 90  BUN _2 --  _3 CREATININE 1.65*  1.63* 1.54* 1.51* 1.51* 1.42*  --  1.41* 1.46* 1.59*  CALCIUM 7.4*  7.3* 7.3* 7.5* 7.4* 7.7*  --  7.7* 7.8* 7.6*  MG  --  1.8  --  1.4*  --  2.1 1.7 1.4* 1.5*  PHOS 3.3 3.5 3.3 3.4 3.6  --   --   --   --     Liver Function Tests:  Recent Labs Lab 08/25/16 1714 08/26/16 0426 08/26/16 1758 08/27/16 0434 08/27/16 1623  ALBUMIN 1.4* 1.4* 1.4* 1.4* 1.5*   Coagulation Profile:  Recent Labs Lab 08/25/16 0355 08/26/16 0426 08/27/16 0434 08/28/16 0446 08/29/16 0430  INR 1.26 1.27 1.32 1.19 1.37   CBG: No results for input(s): GLUCAP in the last 168 hours.   Urine analysis:    Component Value Date/Time   COLORURINE RED (A) 08/22/2016 1015   APPEARANCEUR CLOUDY (A) 08/22/2016 1015   LABSPEC 1.009 08/22/2016 1015   PHURINE 6.0 08/22/2016 1015   GLUCOSEU (A) 08/22/2016 1015    TEST NOT REPORTED DUE TO COLOR INTERFERENCE OF URINE PIGMENT   HGBUR (A) 08/22/2016 1015    TEST NOT REPORTED DUE TO COLOR INTERFERENCE OF URINE PIGMENT   BILIRUBINUR (A) 08/22/2016 1015    TEST NOT REPORTED DUE TO COLOR INTERFERENCE OF URINE PIGMENT   KETONESUR (A) 08/22/2016 1015    TEST NOT REPORTED DUE TO COLOR INTERFERENCE OF URINE PIGMENT   PROTEINUR (A) 08/22/2016 1015    TEST NOT REPORTED DUE TO COLOR INTERFERENCE OF URINE PIGMENT   NITRITE (A) 08/22/2016  1015    TEST NOT REPORTED DUE TO COLOR INTERFERENCE OF URINE PIGMENT   LEUKOCYTESUR (A) 08/22/2016 1015    TEST NOT REPORTED DUE TO COLOR INTERFERENCE OF URINE PIGMENT   Radiology Studies: No results found.  Scheduled Meds: . Chlorhexidine Gluconate Cloth  6 each Topical Daily  . feeding supplement  1 Container Oral BID BM  . folic acid  1 mg Oral Daily  . furosemide  50 mg Oral BID  . Gerhardt's butt cream   Topical Daily  . hydrALAZINE  50 mg Oral Q8H  . lactulose  30 g Oral Daily  . mouth rinse  15 mL Mouth Rinse q12n4p  . multivitamin  1 tablet Oral Daily  . pantoprazole  40 mg Oral Daily  . rifaximin  550 mg Oral BID  . risperiDONE  0.5 mg Oral BID  . sodium chloride flush  10-40 mL Intracatheter Q12H  .  sodium chloride flush  3 mL Intravenous Q12H  . spironolactone  125 mg Oral BID  . thiamine  100 mg Oral Daily   Continuous Infusions: . cefTAZidime (FORTAZ)  IV Stopped (08/31/16 0926)  . magnesium sulfate 1 - 4 g bolus IVPB       LOS: 43 days   Time spent: 25 minutes   Barton Dubois MD  [facility] Pager [phone]  If 7PM-7AM, please contact night-coverage [email or web address] Password [email or web address] 08/31/2016, 1:14 PM

## 2016-08-31 NOTE — Clinical Social Work Note (Signed)
Patient completing IV antibiotics today. PT recommending HHPT. No further need for SNF.  CSW signing off. Consult again if any other social work needs arise.  Charlynn Court, CSW [phone]

## 2016-09-01 ENCOUNTER — Inpatient Hospital Stay (HOSPITAL_COMMUNITY): Payer: Medicaid Other

## 2016-09-01 LAB — BASIC METABOLIC PANEL
Anion gap: 7 (ref 5–15)
BUN: 17 mg/dL (ref 6–20)
CALCIUM: 7.7 mg/dL — AB (ref 8.9–10.3)
CHLORIDE: 103 mmol/L (ref 101–111)
CO2: 27 mmol/L (ref 22–32)
Creatinine, Ser: 1.6 mg/dL — ABNORMAL HIGH (ref 0.61–1.24)
GFR calc Af Amer: 59 mL/min — ABNORMAL LOW (ref >60)
GFR, EST NON AFRICAN AMERICAN: 51 mL/min — AB (ref >60)
Glucose, Bld: 132 mg/dL — ABNORMAL HIGH (ref 65–99)
POTASSIUM: 3.7 mmol/L (ref 3.5–5.1)
Sodium: 137 mmol/L (ref 135–145)

## 2016-09-01 LAB — CBC WITH DIFFERENTIAL/PLATELET
Basophils Absolute: 0.1 10*3/uL (ref 0.0–0.1)
Basophils Relative: 1 %
Eosinophils Absolute: 0.2 10*3/uL (ref 0.0–0.7)
Eosinophils Relative: 4 %
HEMATOCRIT: 25 % — AB (ref 39.0–52.0)
HEMOGLOBIN: 8 g/dL — AB (ref 13.0–17.0)
LYMPHS ABS: 2.3 10*3/uL (ref 0.7–4.0)
Lymphocytes Relative: 45 %
MCH: 28.1 pg (ref 26.0–34.0)
MCHC: 32 g/dL (ref 30.0–36.0)
MCV: 87.7 fL (ref 78.0–100.0)
MONOS PCT: 11 %
Monocytes Absolute: 0.6 10*3/uL (ref 0.1–1.0)
NEUTROS ABS: 2 10*3/uL (ref 1.7–7.7)
NEUTROS PCT: 39 %
Platelets: 168 10*3/uL (ref 150–400)
RBC: 2.85 MIL/uL — ABNORMAL LOW (ref 4.22–5.81)
RDW: 17.4 % — ABNORMAL HIGH (ref 11.5–15.5)
WBC: 5.1 10*3/uL (ref 4.0–10.5)

## 2016-09-01 LAB — MAGNESIUM: Magnesium: 1.6 mg/dL — ABNORMAL LOW (ref 1.7–2.4)

## 2016-09-01 MED ORDER — PNEUMOCOCCAL VAC POLYVALENT 25 MCG/0.5ML IJ INJ
0.5000 mL | INJECTION | INTRAMUSCULAR | Status: AC
  Administered 2016-09-02: 0.5 mL via INTRAMUSCULAR
  Filled 2016-09-01: qty 0.5

## 2016-09-01 MED ORDER — MAGNESIUM OXIDE 400 (241.3 MG) MG PO TABS
400.0000 mg | ORAL_TABLET | Freq: Two times a day (BID) | ORAL | Status: DC
  Administered 2016-09-01 – 2016-09-03 (×5): 400 mg via ORAL
  Filled 2016-09-01 (×5): qty 1

## 2016-09-01 NOTE — Progress Notes (Signed)
OT Cancellation Note  Patient Details Name: Johnathan [name] MRN: [id number] DOB: 12-19-1970   Cancelled Treatment:    Reason Eval/Treat Not Completed: Patient declined, no reason specified; Pt declined OOB/EOB activity, reporting "he doesn't feel like doing anything". Educated on importance of OOB/mobility. Will check back as schedule permits.  Marcy Siren, OT Pager [phone] 09/01/2016   [name] 09/01/2016, 3:48 PM

## 2016-09-01 NOTE — Progress Notes (Signed)
PROGRESS NOTE  Chibueze Beasley  JOA:[id number] DOB: 05/18/1970 DOA: 07/19/2016   PCP: [name], MD   Brief Narrative:  46 yo Male PMHx Hep C and IVDA, Polysubstance Abuse, presented with one week duration of fevers, redness and swelling on the dorsum of the right foot. Pt with ongoing use of opioids, injected Opana 2 days PTA. Pt admitted with right foot cellulitis as well as RLL infiltrate, found to have MSSA bacteremia with possible Meningitis. On 7/17, pt developed acute encephalopathy with agitation, combative requiring precedex drip.   Subjective: AAOX4, no fever, no SOB. Complaining of CP. No nausea, no vomiting.  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 Continue pulmonary hygiene  -PCCM following for effusion and management of chest tube -CT with drastic reduction, down to 250 ml in the last 24 hours. Will continue to monitor.  -renal function/Cr going up; will adjust slightly lasix and spironolactone; continue monitoring renal function. -if we can not control effusion medically, pt may be TIPS candidate as he has clinically improved and has not experience any further hemoptysis  -will follow up PCCM and GI rec's   MSSA and Pseudomonas bacteremia w/ sepsis/Tricuspid valve Endocarditis  -CSF culture negative -afebrile and w/o signs or symptoms for infection -patient non septic, no fever and has completed IV abx's  -will follow stability   Hepatitis C/positive Hep B - liver cirrhosis  - Patient currently not candidate for treatment secondary to drug abuse - will need outpatient follow up  after proving abstinence for 6 months   LUE Radial Vein DVT  - Seen on duplex 7/24.  -No PE on CTA 7/24 -no swelling appreciated   Acute Encephalopathy -resolved and has remained stable  -multifactorial in etiology -will continue monitoring and keeping reorientation     Acute Renal Failure  - Cr  3.17 on admission: Secondary to dehydration.  -Cr 1.60 today (continue to slowly trend up) -most likely from acute diuresis -GFR remains stable even Cr went up some. Will continue same dose of spironolactone and lasix today. Follow renal function.  Hypokalemia/hypomagnesemia -potassium stable -Magnesium slightly low today  -will monitor trend and replete further as needed   Hypernatremia -resolved now -will continue to monitor trend -maintaining adequate hydration and educated about low sodium diet.    Drug abuse and dependence  -cessation counseling provided -patient considering keeping himself clean -will provide with outpatient resources to help him with that task  Protein calorie malnutrition -will follow nutritional service rec's -continue feeding supplements  -patient encourage to follow diet as instructed   Thrombocytopenia, anemia -no signs of bleeding -continue monitoring trend -most likely associated with cirrhosis    DVT prophylaxis: SCD Code Status: Full Family Communication: no family at bedside Disposition Plan: pt with no insurance so can not go to [facility] or SNF apparently. Finishing antibiotics today. Significant reduction seen on CT drainage. Also with increase in Cr; will follow renal function, slightly decrease diuretic dose and replete electrolytes.   Consultants:  PCCM ID  GI  Procedures/Significant Events:  7/13 - Admit to [facility] 7/17 - Transfer to ICU for precedex infusion 7/21 - Precedex off  7/22 -  TRH resumed care 7/24 - venous duplex L UE radial vein DVT 7/25 - PCCM signed off 8/02 - Patient hypothermic & more agitated 8/03 - acute tachypnea  w/ "guppy breathing" > emergently intubated w/ bronchoscopy showing "mild diffuse alveolar hemorrhage" 8/05 - Right chest tube inserted w/ 350 mL serous fluid. Bicarb gtt started for acidosis 8/06 - Switched off Cefepime to [place] given potential for worsening encephalopathy  8/08 - Albumin w/ Lasix today. Improving UOP.  8/09 - Albumin w/ Lasix q6hr x3 doses. Precedex started in an attempt to wean Fentanyl drip.  8/11 - N/V of bilious emesis overnight & continued diuresis 8/12 - Continued diuresis w/ Lasix & added Aldactone daily. Patient apneic during SBT. Decreasing chest tube output. 8/13 - extubated  8/22-8/23--> adjusting/maximizing diuretic dose for cirrhosis and hydrothorax. Trying to remove CT if possible.  RENAL U/S 7/13:   IMPRESSION: 1. Mild right pelvicaliectasis, with ureteral jet noted in the urinary bladder excluding significant ureteral obstruction. 2. Small left renal cyst. 3. Pleural effusions and abdominal ascites. LP 7/13:  Tube 1 - WBC 51 (62% lymph, 29% lymph, 9% mono), RBC 555, Protein 88 , & Glucose 52. / Tube 4 - WBC 180 (58% neutro, 31% lymph, 11% monocytes) & RBC 36. TTE 7/14:  LV normal in size with EF 55-60%. No regional wall motion abnormalities & grade 1 diastolic dysfunction. LA upper limits of normal in size & RA normal in size. RV normal in size and function. No aortic stenosis or regurgitation. Aortic root normal in size. No mitral stenosis or regurgitation. No pulmonic stenosis or regurgitation with poorly visualized valve. No tricuspid regurgitation. Medium sized mobile vegetation noted on tricuspid valve. No pericardial effusion. CT HEAD W/O 7/16:  Progressive atrophy since 2011.  No acute intracranial findings. No abnormal enhancement or vasogenic edema to suggest septic emboli to the brain. VENOUS DUPLEX BILATERAL LOWER EXTREMITY 7/17:  No DVT or SVT.  CTA CHEST 7/24:  Negative for a pulmonary embolism. Extensive pleural-parenchymal lung disease  bilaterally. Large right pleural effusion and moderate-sized left pleural effusion. Pleural-based nodular opacities could represent an infectious etiology but also raise concern for a neoplastic process. Concern for a cavitary lesion and necrotic tissue in the right lung. Consider sampling of the pleural fluid. Recommend follow-up CT when the pleural fluid and acute process have resolved to exclude neoplastic disease. Severe emphysematous disease. Cirrhosis with evidence for portal hypertension based on the splenomegaly and ascites. 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  VENOUS DUPLEX LUE 7/24:  Acute DVT involving small portion of left radial vein in mid-forearm.  CT HEAD W/O 7/26:  Normal head CT. Right Pleural Effusion 7/26:  1.5 L hazy yellow fluid by IR. Protein <3.0, Albumin <1.0, Amylase 39, Glucose 72, LDH 72, & WBC 2431 (11% lymph, 82% neutro, & 7% mono). Cytology negative for malignancy.  MRI BRAIN W/O 8/2:  Exam is limited to diffusion only. Negative for acute infarct. No area of restricted diffusion. EEG 8/2:  This EEG is abnormal due to moderate diffuse slowing of the background. Lingula BAL 8/3:  WBC 78 (39% lymph, 31% neutro, 30% mono). Reported DAH.  TTE 8/3:  LV normal in size with EF 55-60%. No regional wall motion abnormality & grade 1 diastolic dysfunction. LA & RA normal in size. RV normal in size and function. No aortic stenosis or regurgitation. Aortic root normal in size. No mitral stenosis or regurgitation. No pulmonic stenosis. Trivial regurgitation. Trivial pericardial effusion as well.  Notably no vegetations were seen on this exam. Right Pleural Effusion 8/5:  WBC 616 (1% lymph, 1% eos, & 98% neutro). PORT CXR 8/5:  Previously reviewed by me. Right-sided predominant opacities. Right-sided chest tube in place. Endotracheal tube in good position. Enteric feeding tube in good position. Right upper extremity PICC line in good position. Complete Abdominal  U/S 8/6: Stable cirrhotic changes involving the liver without definite focal hepatic lesion. Mild common bile duct dilatation without obvious common bile duct stone or intrahepatic biliary dilatation. Poor visualization of the pancreas. Increased echogenicity of both kidneys suggesting medical renal disease. No hydronephrosis. Large volume ascites and bilateral pleural effusions. Port CXR 8/7:  Previously reviewed by me. Hazy opacities bilateral lower lung zones. Endotracheal tube & enteric feeding tube in good position. Right chest tube in good position.  PORT CXR 8/11:  Personally reviewed by me. Patchy hazy opacities bilaterally which are improving. No appreciable residual pleural effusion on the right. Right-sided chest tube in good position. Endotracheal tube and right upper extremity central venous catheter in good position. Enteric feeding tube coursing below diaphragm. PORT ABD X-RAY 8/11: Paucity of bowel gas without evidence of enteric obstruction. Enteric tube tip and side port project over the expected location of the mid body of the stomach. CT abd / pelv 8/12 > persistent bilateral pleural effusion (though improved), 3cm irregular rounded density in RLL.  Cirrhosis with splenomegaly and ascites, anasarca. Inflammatory vs infectious process involving duodenum  Cultures Blood Cultures x2 7/12:  2/2 Bottles MSSA & 1/2 Bottles Pseudomonas aeruginosa  CSF Culture 7/13:  Negative  MRSA PCR 7/13:  Negative HIV 7/13:  Negative Respiratory Viral Panel PCR 7/13:  Negative  Urine Culture 7/13:  Multiple Species Present Blood Cultures x2 7/14:  1/2 Bottles MSSA Blood Culture x1 7/15:  Negative  MRSA PCR 7/17:  Negative  Urine Culture 7/17:  Negative  Urine Streptococcal Antigen 7/17:  Negative Urine Legionella Antigen 7/17:  Negative  Right Pleural Fluid Culture 7/26 >>> Bacteria Negative / AFB pending / Fungus pending Lingula BAL 8/3:  Candida tropicalis  Blood Cultures x2 8/3:  Negative    Right Pleural Culture 8/5:  Negative  Blood Culture x2 8/6:  Negative   Antimicrobials: Anti-infectives    Start     Stop   08/22/16 1100  rifaximin (XIFAXAN) tablet 550 mg         08/19/16 1000  cefTAZidime (FORTAZ) 1 g in dextrose 5 % 50 mL IVPB     08/31/16 2359   08/12/16 1100  cefTAZidime (FORTAZ) 2 g in dextrose 5 % 50 mL IVPB  Status:  Discontinued     08/19/16 0555   08/11/16 1000  ceFEPIme (MAXIPIME) 1 g in dextrose 5 % 50 mL IVPB  Status:  Discontinued     08/12/16 0936   08/10/16 1500  vancomycin (VANCOCIN) 1,250 mg in sodium chloride 0.9 % 250 mL IVPB  Status:  Discontinued     08/11/16 0930   08/10/16 1000  ceFEPIme (MAXIPIME) 2 g in dextrose 5 % 50 mL IVPB  Status:  Discontinued     08/11/16 0930   08/09/16 1300  vancomycin (VANCOCIN) 1,500 mg in sodium chloride 0.9 % 500 mL IVPB     08/09/16 1602   07/22/16 1400  ceFEPIme (MAXIPIME) 2 g in dextrose 5 % 50 mL IVPB  Status:  Discontinued     08/09/16 1226   07/20/16 1500  ceFEPIme (MAXIPIME) 2 g in dextrose 5 % 50 mL IVPB  Status:  Discontinued     07/22/16 1122   07/19/16 2300  vancomycin (VANCOCIN) IVPB 750 mg/150 ml premix  Status:  Discontinued     07/19/16 0953   07/19/16 2200  ceFEPIme (MAXIPIME) 2 g in dextrose 5 % 50 mL IVPB  Status:  Discontinued     07/20/16 1410   07/19/16 2000  nafcillin 2 g in dextrose 5 % 100 mL IVPB  Status:  Discontinued     07/22/16 1130   07/19/16 1915  nafcillin injection 2 g  Status:  Discontinued     07/19/16 1915   07/19/16 1800  ceFEPIme (MAXIPIME) 2 g in dextrose 5 % 50 mL IVPB  Status:  Discontinued     07/19/16 1912   07/19/16 1100  vancomycin (VANCOCIN) 500 mg in sodium chloride 0.9 % 100 mL IVPB  Status:  Discontinued     07/19/16 1645   07/19/16 0800  piperacillin-tazobactam (ZOSYN) IVPB 3.375 g  Status:  Discontinued     07/19/16 1645   07/19/16 0200  cefTRIAXone (ROCEPHIN) 2 g in dextrose 5 % 50 mL IVPB     07/19/16 0259   07/19/16 0130  cefTRIAXone (ROCEPHIN) 1 g  in dextrose 5 % 50 mL IVPB  Status:  Discontinued     07/19/16 0140   07/19/16 0115  piperacillin-tazobactam (ZOSYN) IVPB 3.375 g     07/19/16 0219   07/19/16 0115  vancomycin (VANCOCIN) IVPB 1000 mg/200 mL premix     07/19/16 0243     LINES / TUBES:  OETT 8/3 >>>8/13 Rt CHEST TUBE 8/5 >>> RUE DL PICC 7/27 >>>  Continuous Infusions:  Objective: Vitals:   08/31/16 1423 08/31/16 2102 09/01/16 0543 09/01/16 1300  BP: (!) 148/90 135/82 135/75 133/87  Pulse: 89 92 80 75  Resp: _0 Temp: 97.7 F (36.5 C) 98.2 F (36.8 C) 97.8 F (36.6 C) 97.7 F (36.5 C)  TempSrc: Oral Oral  Oral  SpO2: 100% 99% 100% 100%  Weight:      Height:        Intake/Output Summary (Last 24 hours) at 09/01/16 1921 Last data filed at 09/01/16 1849  Gross per 24 hour  Intake             1225 ml  Output             2940 ml  Net            -1715 ml   Filed Weights   08/29/16 0500 08/30/16 0500 08/31/16 0500  Weight: 71.8 kg (158 lb 4.6 oz) 67.1 kg (147 lb 14.9 oz) 66.8 kg (147 lb 4.3 oz)   Physical Exam  Constitutional: no fever, no nausea, no vomiting, no SOB. Reporting pain in his right side around chest tube. No abd pain and remains AAOX3, CT drainage slightly higher in last 24 hours, approx 400.   Rest of physical exam unchanged from 08/31/16; see below for details.  CVS: RRR, no rubs, no gallops, no murmur, no JVD  Pulmonary: decrease BS at right base, no wheezing, no crackles. No using accessory muscles.  Abdominal: soft, NT, ND, positive BS, no ascites appreciated.  Musculoskeletal: no joint swelling, no erythema, no cyanosis    Psychiatric: AAOX4, no focal neurologic deficit, good insight and following commands properly.     Data Reviewed: I have reviewed patient's blood work today, vital signs, discussed with patient at bedside  CBC:  Recent Labs Lab 08/28/16 0446 08/29/16 0430 08/30/16 0309  08/31/16 0445 09/01/16 0347  WBC 8.7 6.0 6.5 5.9 5.1  NEUTROABS 4.5 2.9 2.9 2.5  2.0  HGB 8.9* 8.0* 8.0* 7.8* 8.0*  HCT 27.6* 25.1* 24.9* 24.0* 25.0*  MCV 87.1 86.6 86.2 86.0 87.7  PLT 173 157 174 166 161   Basic Metabolic Panel:  Recent Labs Lab 08/26/16 0426 08/26/16 1758 08/27/16 0434 08/27/16 1623 08/28/16 0446 08/29/16 0430 08/30/16 0309 08/31/16 0445 09/01/16 0347  NA 138 138 139 139  --  136 138 137 137  K 3.6 4.1 4.2 4.3  --  4.0 4.0 3.8 3.7  CL 108 109 112* 112*  --  109 107 105 103  CO2 _0 --  _1 GLUCOSE 81 80 75 88  --  84 76 90 132*  BUN _2 --  _3 CREATININE 1.54* 1.51* 1.51* 1.42*  --  1.41* 1.46* 1.59* 1.60*  CALCIUM 7.3* 7.5* 7.4* 7.7*  --  7.7* 7.8* 7.6* 7.7*  MG 1.8  --  1.4*  --  2.1 1.7 1.4* 1.5* 1.6*  PHOS 3.5 3.3 3.4 3.6  --   --   --   --   --     Liver Function Tests:  Recent Labs Lab 08/26/16 0426 08/26/16 1758 08/27/16 0434 08/27/16 1623  ALBUMIN 1.4* 1.4* 1.4* 1.5*   Coagulation Profile:  Recent Labs Lab 08/26/16 0426 08/27/16 0434 08/28/16 0446 08/29/16 0430  INR 1.27 1.32 1.19 1.37   CBG: No results for input(s): GLUCAP in the last 168 hours.   Urine analysis:    Component Value Date/Time   COLORURINE RED (A) 08/22/2016 1015   APPEARANCEUR CLOUDY (A) 08/22/2016 1015   LABSPEC 1.009 08/22/2016 1015   PHURINE 6.0 08/22/2016 1015   GLUCOSEU (A) 08/22/2016 1015    TEST NOT REPORTED DUE TO COLOR INTERFERENCE OF URINE PIGMENT   HGBUR (A) 08/22/2016 1015    TEST NOT REPORTED DUE TO COLOR INTERFERENCE OF URINE PIGMENT   BILIRUBINUR (A) 08/22/2016 1015    TEST NOT REPORTED DUE TO COLOR INTERFERENCE OF URINE PIGMENT   KETONESUR (A) 08/22/2016 1015    TEST NOT REPORTED DUE TO COLOR INTERFERENCE OF URINE PIGMENT   PROTEINUR (A) 08/22/2016 1015    TEST NOT REPORTED DUE TO COLOR INTERFERENCE OF URINE PIGMENT   NITRITE (A) 08/22/2016 1015    TEST NOT REPORTED DUE TO COLOR INTERFERENCE OF URINE PIGMENT   LEUKOCYTESUR (A) 08/22/2016 1015    TEST NOT REPORTED DUE TO COLOR  INTERFERENCE OF URINE PIGMENT   Radiology Studies: Dg Chest 2 View  Result Date: 09/01/2016 CLINICAL DATA:  Chest tube placement, continued surveillance. EXAM: CHEST  2 VIEW COMPARISON:  08/28/2016 FINDINGS: RIGHT chest tube good position. Less than 5% RIGHT pneumothorax redemonstrated, similar to priors. Unchanged PICC line, tip in RIGHT atrium. BILATERAL pulmonary opacities without significant consolidation or edema. Small RIGHT effusion may be increased. Normal cardiomediastinal silhouette. IMPRESSION: RIGHT chest tube good position. Less than 5% RIGHT apical pneumothorax can still be visualized. Electronically Signed   By: Staci Righter M.D.   On: 09/01/2016 15:17    Scheduled Meds: . Chlorhexidine Gluconate Cloth  6 each Topical Daily  . feeding supplement  1 Container Oral BID BM  . folic acid  1 mg Oral Daily  . furosemide  50 mg Oral BID  . Gerhardt's butt cream   Topical Daily  . hydrALAZINE  50 mg Oral Q8H  .  lactulose  30 g Oral Daily  . magnesium oxide  400 mg Oral BID  . mouth rinse  15 mL Mouth Rinse q12n4p  . multivitamin  1 tablet Oral Daily  . pantoprazole  40 mg Oral Daily  . [START ON 09/02/2016] pneumococcal 23 valent vaccine  0.5 mL Intramuscular Tomorrow-1000  . rifaximin  550 mg Oral BID  . risperiDONE  0.5 mg Oral BID  . sodium chloride flush  10-40 mL Intracatheter Q12H  . sodium chloride flush  3 mL Intravenous Q12H  . spironolactone  125 mg Oral BID  . thiamine  100 mg Oral Daily   Continuous Infusions:    LOS: 44 days   Time spent: 25 minutes   Barton Dubois MD  [facility] Pager [phone]  If 7PM-7AM, please contact night-coverage [email or web address] Password [facility] 09/01/2016, 7:21 PM

## 2016-09-02 DIAGNOSIS — J948 Other specified pleural conditions: Secondary | ICD-10-CM

## 2016-09-02 DIAGNOSIS — R188 Other ascites: Secondary | ICD-10-CM

## 2016-09-02 DIAGNOSIS — B169 Acute hepatitis B without delta-agent and without hepatic coma: Secondary | ICD-10-CM

## 2016-09-02 DIAGNOSIS — K7460 Unspecified cirrhosis of liver: Secondary | ICD-10-CM

## 2016-09-02 LAB — CBC WITH DIFFERENTIAL/PLATELET
BASOS ABS: 0 10*3/uL (ref 0.0–0.1)
BASOS PCT: 1 %
EOS ABS: 0.2 10*3/uL (ref 0.0–0.7)
EOS PCT: 4 %
HCT: 26.1 % — ABNORMAL LOW (ref 39.0–52.0)
Hemoglobin: 8.4 g/dL — ABNORMAL LOW (ref 13.0–17.0)
Lymphocytes Relative: 41 %
Lymphs Abs: 2.4 10*3/uL (ref 0.7–4.0)
MCH: 28.3 pg (ref 26.0–34.0)
MCHC: 32.2 g/dL (ref 30.0–36.0)
MCV: 87.9 fL (ref 78.0–100.0)
MONO ABS: 0.6 10*3/uL (ref 0.1–1.0)
Monocytes Relative: 11 %
Neutro Abs: 2.5 10*3/uL (ref 1.7–7.7)
Neutrophils Relative %: 43 %
Platelets: 177 10*3/uL (ref 150–400)
RBC: 2.97 MIL/uL — AB (ref 4.22–5.81)
RDW: 17.3 % — AB (ref 11.5–15.5)
WBC: 5.7 10*3/uL (ref 4.0–10.5)

## 2016-09-02 LAB — BASIC METABOLIC PANEL
Anion gap: 5 (ref 5–15)
BUN: 22 mg/dL — ABNORMAL HIGH (ref 6–20)
CO2: 29 mmol/L (ref 22–32)
CREATININE: 1.54 mg/dL — AB (ref 0.61–1.24)
Calcium: 7.8 mg/dL — ABNORMAL LOW (ref 8.9–10.3)
Chloride: 104 mmol/L (ref 101–111)
GFR calc non Af Amer: 53 mL/min — ABNORMAL LOW (ref >60)
Glucose, Bld: 84 mg/dL (ref 65–99)
Potassium: 3.8 mmol/L (ref 3.5–5.1)
SODIUM: 138 mmol/L (ref 135–145)

## 2016-09-02 LAB — MAGNESIUM: MAGNESIUM: 1.4 mg/dL — AB (ref 1.7–2.4)

## 2016-09-02 NOTE — Progress Notes (Signed)
Daily Rounding Note  09/02/2016, 11:15 AM  LOS: 45 days   SUBJECTIVE:   Chief complaint: wondering when CT is coming out.  Some pain at CGT site with movement   CTY output: 500 .. 140.Marland Kitchen 160 in last 3 days.   1 BM yesterday.    OBJECTIVE:         Vital signs in last 24 hours:    Temp:  [97.7 F (36.5 C)-98 F (36.7 C)] 98 F (36.7 C) (08/27 0458) Pulse Rate:  [75-111] 88 (08/27 0458) Resp:  [18] 18 (08/27 0458) BP: (126-133)/(78-87) 126/78 (08/27 0458) SpO2:  [99 %-100 %] 99 % (08/27 0458) Last BM Date: 08/31/16 Filed Weights   08/29/16 0500 08/30/16 0500 08/31/16 0500  Weight: 71.8 kg (158 lb 4.6 oz) 67.1 kg (147 lb 14.9 oz) 66.8 kg (147 lb 4.3 oz)   General: looks aged beyond years, not critically ill looking   Heart: RRR Chest: clear bil.  No labored breathing.  CT in place with yellow, clear drainage Abdomen: soft, NT, ND.  Active BS  Extremities: no CCE Neuro/Psych:  Oriented x 3, calm, cooperative.  No tremor or asterixis.    Intake/Output from previous day: 08/26 0701 - 08/27 0700 In: 865 [P.O.:855; I.V.:10] Out: 3560 [Urine:3400; Chest Tube:160]  Intake/Output this shift: Total I/O In: 363 [P.O.:360; I.V.:3] Out: 1800 [Urine:1800]  Lab Results:  Recent Labs  08/31/16 0445 09/01/16 0347 09/02/16 0408  WBC 5.9 5.1 5.7  HGB 7.8* 8.0* 8.4*  HCT 24.0* 25.0* 26.1*  PLT 166 168 177   BMET  Recent Labs  08/31/16 0445 09/01/16 0347 09/02/16 0408  NA 137 137 138  K 3.8 3.7 3.8  CL 105 103 104  CO2 25 27 29   GLUCOSE 90 132* 84  BUN 13 17 22*  CREATININE 1.59* 1.60* 1.54*  CALCIUM 7.6* 7.7* 7.8*   LFT No results for input(s): PROT, ALBUMIN, AST, ALT, ALKPHOS, BILITOT, BILIDIR, IBILI in the last 72 hours. PT/INR No results for input(s): LABPROT, INR in the last 72 hours. Hepatitis Panel No results for input(s): HEPBSAG, HCVAB, HEPAIGM, HEPBIGM in the last 72  hours.  Studies/Results: Dg Chest 2 View  Result Date: 09/01/2016 CLINICAL DATA:  Chest tube placement, continued surveillance. EXAM: CHEST  2 VIEW COMPARISON:  08/28/2016 FINDINGS: RIGHT chest tube good position. Less than 5% RIGHT pneumothorax redemonstrated, similar to priors. Unchanged PICC line, tip in RIGHT atrium. BILATERAL pulmonary opacities without significant consolidation or edema. Small RIGHT effusion may be increased. Normal cardiomediastinal silhouette. IMPRESSION: RIGHT chest tube good position. Less than 5% RIGHT apical pneumothorax can still be visualized. Electronically Signed   By: Elsie Stain M.D.   On: 09/01/2016 15:17   Scheduled Meds: . Chlorhexidine Gluconate Cloth  6 each Topical Daily  . feeding supplement  1 Container Oral BID BM  . folic acid  1 mg Oral Daily  . furosemide  50 mg Oral BID  . Gerhardt's butt cream   Topical Daily  . hydrALAZINE  50 mg Oral Q8H  . lactulose  30 g Oral Daily  . magnesium oxide  400 mg Oral BID  . mouth rinse  15 mL Mouth Rinse q12n4p  . multivitamin  1 tablet Oral Daily  . pantoprazole  40 mg Oral Daily  . rifaximin  550 mg Oral BID  . risperiDONE  0.5 mg Oral BID  . sodium chloride flush  10-40 mL Intracatheter Q12H  . sodium chloride  flush  3 mL Intravenous Q12H  . spironolactone  125 mg Oral BID  . thiamine  100 mg Oral Daily   Continuous Infusions: PRN Meds:.acetaminophen, albuterol, dextrose, haloperidol lactate, ipratropium, labetalol, morphine injection, ondansetron (ZOFRAN) IV, sodium chloride flush, sodium chloride flush   ASSESMENT:   *  ? Hepatic hydrothorax.  Transudative pleural effusion.  Diuretics increased last week, Lasix 100, aldactone 250 daily.    *  AKI, ? CKD.  Creatinine 1.41 .Marland Kitchen 1.46.. 1.49.. 1.60.Marland Kitchen 1.54.   *  Normocytic anemia.  PRBC x 1 on 08/13/16.      *  Cirrhosis, Hep C and B +.  Pt never returned for follow up when hep C diagnosed 09/2014.    *  Cavitary PNA and tricuspid valve  endocarditis.    *  HE,  ??.  encephalopathy of critical illness.  Ammonia only 28 on 7/27.  On Rifaximin and Lactulose.     *  Opiate abuse, shot up Opana PTA.     PLAN   *  Mgt of CT per pulmonary.   Leave on current doses of diuretics, follow renal function.  Low Na diet.    *  Stop Rifaximin and Lactulose.      Jennye Moccasin  09/02/2016, 11:15 AM Pager: [phone]

## 2016-09-02 NOTE — Care Management Note (Addendum)
Case Management Note  Patient Details  Name: Johnathan [name] MRN: [id number] Date of Birth: February 14, 1970  Subjective/Objective:                    Action/Plan:  Spoke to patient at bedside.   PCP is Stephanie C. Andree Coss, FNP-C [facility] in [place] [address] [place], [place] [address] Phone: [phone] Fax: [phone]   However, patient has only seen her one time for a physical.   Provided patient with [facility] information , in case he is interested. Confirmed patient is uninsured. He would like to speak with financial counselor.  Called [facility] and spoke to  [employer] 336 [phone].   Can provide MATCH letter at discharge to assist with medicines.  Confirmed face sheet information with patient.   Patient lives with his 61 year old son. Patient's mother and grandmother provide assistance.   PT recommending HHPT. Explained to patient he does not not have a Medicaid qualifying DX for HHPT, therefore unable to arrange HHPT.  Patient voiced understanding to all of the above. Expected Discharge Date:                  Expected Discharge Plan:  Home/Self Care  In-House Referral:  NA  Discharge planning Services  CM Consult  Post Acute Care Choice:  NA Choice offered to:  Patient  DME Arranged:  3-N-1, Shower stool, Walker rolling DME Agency:  [facility]  HH Arranged:  NA HH Agency:  NA  Status of Service:  In process, will continue to follow  If discussed at Long Length of Stay Meetings, dates discussed:    Additional Comments:  Kingsley Plan, RN 09/02/2016, 10:57 AM

## 2016-09-02 NOTE — Progress Notes (Addendum)
Occupational Therapy Treatment Patient Details Name: Johnathan [name] MRN: [id number] DOB: 03/20/1970 Today's Date: 09/02/2016    History of present illness Pt admitted 7/13 with sepsis, MSSA bacteremia, B LE celllulitis, tricuspid valve vegetation, R LL cavity PNA with pleural effusion, hep C. Developed encephalopathy and agitation and transferred to ICU 7/17. + DVT LUE. Intubated 8/3-8/13; s/p CT placement 8/5 secondary to effusion. PMH: polysubstance abuse.   OT comments  Pt progressing toward OT goals. He was able to complete ambulating simulated toilet transfer with fluctuating min guard to min assist this session. He continues to demonstrate significantly decreased activity tolerance for ADL and required seated rest break during task due to fatigue. His cognitive status is improving and pt demonstrating improved problem solving skills this session. However, he continues to demonstrate decreased awareness making him unsafe during ADL tasks. Pt will need continued rehabilitation post-acute D/C at home health level. Goals and timeline updated in care plans section. Will continue to follow.    Follow Up Recommendations  Home health OT;Supervision/Assistance - 24 hour    Equipment Recommendations  3 in 1 bedside commode;Tub/shower seat    Recommendations for Other Services      Precautions / Restrictions Precautions Precautions: Fall Restrictions Weight Bearing Restrictions: No       Mobility Bed Mobility Overal bed mobility: Needs Assistance Bed Mobility: Supine to Sit;Sit to Supine     Supine to sit: Supervision Sit to supine: Supervision   General bed mobility comments: Supervision for safety. Pt declined being pulled up in bed once in supine position.   Transfers Overall transfer level: Needs assistance Equipment used: Rolling walker (2 wheeled) Transfers: Sit to/from Stand Sit to Stand: Min guard         General transfer comment: Min guard assist for safety and  cues for hand placement.     Balance Overall balance assessment: Needs assistance Sitting-balance support: No upper extremity supported;Feet supported Sitting balance-Leahy Scale: Good     Standing balance support: Bilateral upper extremity supported;During functional activity Standing balance-Leahy Scale: Fair Standing balance comment: Able to statically stand without UE support with min guard assist. Requires B UE support during mobility.                            ADL either performed or assessed with clinical judgement   ADL Overall ADL's : Needs assistance/impaired Eating/Feeding: Sitting;Supervision/ safety Eating/Feeding Details (indicate cue type and reason): VC's to follow SLP recommendations for no straws                     Toilet Transfer: Minimal assistance;RW;Ambulation;Min guard           Functional mobility during ADLs:  (Fluctuating between min guard and min assist.) General ADL Comments: Pt with improving safety awareness but significantly diminished activity tolerance for ADL requiring seated rest break during simulated ambulating toilet transfer.     Vision   Vision Assessment?: No apparent visual deficits   Perception     Praxis      Cognition Arousal/Alertness: Awake/alert Behavior During Therapy: WFL for tasks assessed/performed Overall Cognitive Status: Within Functional Limits for tasks assessed Area of Impairment: Problem solving;Safety/judgement;Awareness;Attention                   Current Attention Level: Selective     Safety/Judgement: Decreased awareness of safety;Decreased awareness of deficits Awareness: Emergent Problem Solving: Decreased initiation;Difficulty sequencing;Requires verbal cues General Comments: Cueing for safety  with use of RW. Pt less impulsive this session.         Exercises     Shoulder Instructions       General Comments Pt reporting dizziness at times which resolved with  reclined positioning.     Pertinent Vitals/ Pain       Pain Assessment: 0-10 Pain Score: 6  Pain Location: chest Pain Descriptors / Indicators: Aching;Discomfort;Tightness Pain Intervention(s): Monitored during session;Repositioned  Home Living                                          Prior Functioning/Environment              Frequency  Min 3X/week        Progress Toward Goals  OT Goals(current goals can now be found in the care plan section)  Progress towards OT goals: Progressing toward goals (updated goal time frame)  Acute Rehab OT Goals Patient Stated Goal: to get stronger OT Goal Formulation: Patient unable to participate in goal setting Time For Goal Achievement: 09/16/16 Potential to Achieve Goals: Good ADL Goals Pt Will Perform Eating: Independently;sitting (including gathering items) Pt Will Perform Grooming: Independently;standing Pt Will Transfer to Toilet: with supervision;ambulating Additional ADL Goal #2: Pt will demonstrate alternating attention during grooming tasks in a minimally distracting environment.   Plan Discharge plan needs to be updated;Frequency needs to be updated    Co-evaluation                 AM-PAC PT "6 Clicks" Daily Activity     Outcome Measure   Help from another person eating meals?: A Little Help from another person taking care of personal grooming?: A Little Help from another person toileting, which includes using toliet, bedpan, or urinal?: A Little Help from another person bathing (including washing, rinsing, drying)?: A Lot Help from another person to put on and taking off regular upper body clothing?: A Little Help from another person to put on and taking off regular lower body clothing?: A Lot 6 Click Score: 16    End of Session Equipment Utilized During Treatment: Gait belt;Rolling walker  OT Visit Diagnosis: Muscle weakness (generalized) (M62.81);Other symptoms and signs involving  cognitive function   Activity Tolerance Patient tolerated treatment well   Patient Left in bed;with call bell/phone within reach   Nurse Communication Mobility status;Patient requests pain meds (pt IV beeping)        Time: 7062-3762 OT Time Calculation (min): 20 min  Charges: OT General Charges $OT Visit: 1 Visit OT Treatments $Self Care/Home Management : 8-22 mins  Doristine Section, MS OTR/L  Pager: [phone]    Johnathan [name] 09/02/2016, 5:23 PM

## 2016-09-02 NOTE — Progress Notes (Signed)
PULMONARY / [facility]   Name: Johnathan [name] MRN: [id number] DOB: 03/21/1970    ADMISSION DATE:  07/19/2016 CONSULTATION DATE:  07/23/2016  REFERRING MD:  Ree Kida  HISTORY OF PRESENT ILLNESS:  46 y.o. malew Hep C?, IVDA, (recently injected opana) presented 7/13 with fevers.  Admitted with R foot cellulitis, RLL infiltrate.  Found to have MSSA bacteremia.  Course c/b acute encephalopathy requiring ICU admission, precedex gtt, persistent transudative R pleural effusion ultimately requiring chest tube.  Now monitoring ongoing chest tube outpt.       SUBJECTIVE:  No sig change over weekend.  CT output much improved.  144m in last 24 hours.   VITAL SIGNS: BP 126/78 (BP Location: Left Arm)   Pulse 88   Temp 98 F (36.7 C) (Oral)   Resp 18   Ht _0  (1.803 m)   Wt 66.8 kg (147 lb 4.3 oz)   SpO2 99%   BMI 20.54 kg/m  Room air  INTAKE / OUTPUT:  Intake/Output Summary (Last 24 hours) at 09/02/16 0951 Last data filed at 09/02/16 05027 Gross per 24 hour  Intake              868 ml  Output             4460 ml  Net            -3592 ml     PHYSICAL EXAMINATION: General appearance:  Frail 46Year old  Male,cachectic, NAD, pleasant  Eyes: anicteric sclerae, moist conjunctivae; PERRL, EOMI bilaterally. Mouth:  membranes and no mucosal ulcerations; normal hard and soft palate Neck: Trachea midline; neck supple, no JVD Lungs/chest: resps even non labored on RA, clear, R chest tube to water seal with serous drainage, no air leak.   CV: RRR, no MRGs  Abdomen: Soft, non-tender; no masses or HSM Extremities: No peripheral edema or extremity lymphadenopathy Skin: Normal temperature, turgor and texture; no rash, ulcers or subcutaneous nodules Psych: Appropriate affect, alert and oriented to person, place and time   LABS:  BMET  Recent Labs Lab 08/31/16 0445 09/01/16 0347 09/02/16 0408  NA 137 137 138  K 3.8 3.7 3.8  CL 105 103 104  CO2 _1 BUN  13 17 22*  CREATININE 1.59* 1.60* 1.54*  GLUCOSE 90 132* 84    Electrolytes  Recent Labs Lab 08/26/16 1758 08/27/16 0434 08/27/16 1623  08/31/16 0445 09/01/16 0347 09/02/16 0408  CALCIUM 7.5* 7.4* 7.7*  < > 7.6* 7.7* 7.8*  MG  --  1.4*  --   < > 1.5* 1.6* 1.4*  PHOS 3.3 3.4 3.6  --   --   --   --   < > = values in this interval not displayed.  CBC  Recent Labs Lab 08/31/16 0445 09/01/16 0347 09/02/16 0408  WBC 5.9 5.1 5.7  HGB 7.8* 8.0* 8.4*  HCT 24.0* 25.0* 26.1*  PLT 166 168 177    Coag's  Recent Labs Lab 08/27/16 0434 08/28/16 0446 08/29/16 0430  APTT 70* 77* 60*  INR 1.32 1.19 1.37    Sepsis Markers No results for input(s): LATICACIDVEN, PROCALCITON, O2SATVEN in the last 168 hours.  ABG No results for input(s): PHART, PCO2ART, PO2ART in the last 168 hours.  Liver Enzymes  Recent Labs Lab 08/26/16 1758 08/27/16 0434 08/27/16 1623  ALBUMIN 1.4* 1.4* 1.5*    Cardiac Enzymes No results for input(s): TROPONINI, PROBNP in the last 168 hours.  Glucose No results for  input(s): GLUCAP in the last 168 hours.  Imaging Dg Chest 2 View  Result Date: 09/01/2016 CLINICAL DATA:  Chest tube placement, continued surveillance. EXAM: CHEST  2 VIEW COMPARISON:  08/28/2016 FINDINGS: RIGHT chest tube good position. Less than 5% RIGHT pneumothorax redemonstrated, similar to priors. Unchanged PICC line, tip in RIGHT atrium. BILATERAL pulmonary opacities without significant consolidation or edema. Small RIGHT effusion may be increased. Normal cardiomediastinal silhouette. IMPRESSION: RIGHT chest tube good position. Less than 5% RIGHT apical pneumothorax can still be visualized. Electronically Signed   By: Staci Righter M.D.   On: 09/01/2016 15:17    STUDIES:  RENAL U/S 7/13:   IMPRESSION: 1. Mild right pelvicaliectasis, with ureteral jet noted in the urinary bladder excluding significant ureteral obstruction. 2. Small left renal cyst. 3. Pleural effusions and  abdominal ascites. LP 7/13:  Tube 1 - WBC 51 (62% lymph, 29% lymph, 9% mono), RBC 555, Protein 88 , & Glucose 52. / Tube 4 - WBC 180 (58% neutro, 31% lymph, 11% monocytes) & RBC 36. TTE 7/14:  LV normal in size with EF 55-60%. No regional wall motion abnormalities & grade 1 diastolic dysfunction. LA upper limits of normal in size & RA normal in size. RV normal in size and function. No aortic stenosis or regurgitation. Aortic root normal in size. No mitral stenosis or regurgitation. No pulmonic stenosis or regurgitation with poorly visualized valve. No tricuspid regurgitation. Medium sized mobile vegetation noted on tricuspid valve. No pericardial effusion. CT HEAD W/O 7/16:  Progressive atrophy since 2011.  No acute intracranial findings. No abnormal enhancement or vasogenic edema to suggest septic emboli to the brain. VENOUS DUPLEX BILATERAL LOWER EXTREMITY 7/17:  No DVT or SVT.  CTA CHEST 7/24:   IMPRESSION: 1. Negative for a pulmonary embolism. 2. Extensive pleural-parenchymal lung disease bilaterally. Large right pleural effusion and moderate-sized left pleural effusion. Pleural-based nodular opacities could represent an infectious etiology but also raise concern for a neoplastic process. Concern for a cavitary lesion and necrotic tissue in the right lung. Consider sampling of the pleural fluid. Recommend follow-up CT when the pleural fluid and acute process have resolved to exclude neoplastic disease. 3. Severe emphysematous disease. 4. Cirrhosis with evidence for portal hypertension based on the splenomegaly and ascites. 5. 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 Recommend annual imaging followup by CTA or MRA. This recommendation follows 2010 ACCF/AHA/AATS/ACR/ASA/SCA/SCAI/SIR/STS/SVM Guidelines for the Diagnosis and Management of Patients with Thoracic Aortic Disease. Circulation. 2010; 121: I297-L892 VENOUS DUPLEX LUE 7/24:  Acute DVT involving small portion of left  radial vein in mid-forearm.  CT HEAD W/O 7/26:  Normal head CT. Right Pleural Effusion 7/26:  1.5 L hazy yellow fluid by IR. Protein <3.0, Albumin <1.0, Amylase 39, Glucose 72, LDH 72, & WBC 2431 (11% lymph, 82% neutro, & 7% mono). Cytology negative for malignancy.  MRI BRAIN W/O 8/2:  Exam is limited to diffusion only. Negative for acute infarct. No area of restricted diffusion. EEG 8/2:  This EEG is abnormal due to moderate diffuse slowing of the background. Lingula BAL 8/3:  WBC 78 (39% lymph, 31% neutro, 30% mono). Reported DAH.  TTE 8/3:  LV normal in size with EF 55-60%. No regional wall motion abnormality & grade 1 diastolic dysfunction. LA & RA normal in size. RV normal in size and function. No aortic stenosis or regurgitation. Aortic root normal in size. No mitral stenosis or regurgitation. No pulmonic stenosis. Trivial regurgitation. Trivial pericardial effusion as well. Notably no  vegetations were seen on this exam. Right Pleural Effusion 8/5:  WBC 616 (1% lymph, 1% eos, & 98% neutro). PORT CXR 8/5:  Previously reviewed by me. Right-sided predominant opacities. Right-sided chest tube in place. Endotracheal tube in good position. Enteric feeding tube in good position. Right upper extremity PICC line in good position. Complete Abdominal U/S 8/6: IMPRESSION: 1. Stable cirrhotic changes involving the liver without definite focal hepatic lesion. 2. Mild common bile duct dilatation without obvious common bile duct stone or intrahepatic biliary dilatation. 3. Poor visualization of the pancreas. 4. Increased echogenicity of both kidneys suggesting medical renal disease. No hydronephrosis. 5. Large volume ascites and bilateral pleural effusions. Port CXR 8/7:  Previously reviewed by me. Hazy opacities bilateral lower lung zones. Endotracheal tube & enteric feeding tube in good position. Right chest tube in good position.  PORT CXR 8/11:  Personally reviewed by me. Patchy hazy opacities bilaterally  which are improving. No appreciable residual pleural effusion on the right. Right-sided chest tube in good position. Endotracheal tube and right upper extremity central venous catheter in good position. Enteric feeding tube coursing below diaphragm. PORT ABD X-RAY 8/11: IMPRESSION: 1. Paucity of bowel gas without evidence of enteric obstruction. 2. Enteric tube tip and side port project over the expected location of the mid body of the stomach. CT abd / pelv 8/12 > persistent bilateral pleural effusion (though improved), 3cm irregular rounded density in RLL.  Cirrhosis with splenomegaly and ascites, anasarca. Inflammatory vs infectious process involving duodenum.  MICROBIOLOGY: Blood Cultures x2 7/12:  2/2 Bottles MSSA & 1/2 Bottles Pseudomonas aeruginosa  CSF Culture 7/13:  Negative  MRSA PCR 7/13:  Negative HIV 7/13:  Negative Respiratory Viral Panel PCR 7/13:  Negative  Urine Culture 7/13:  Multiple Species Present Blood Cultures x2 7/14:  1/2 Bottles MSSA Blood Culture x1 7/15:  Negative  MRSA PCR 7/17:  Negative  Urine Culture 7/17:  Negative  Urine Streptococcal Antigen 7/17:  Negative Urine Legionella Antigen 7/17:  Negative  Right Pleural Fluid Culture 7/26 >>> Bacteria Negative / AFB pending / Fungus pending Lingula BAL 8/3:  Candida tropicalis  Blood Cultures x2 8/3:  Negative  Right Pleural Culture 8/5:  Negative  Blood Culture x2 8/6:  Negative   ANTIBIOTICS: Ceftriaxone 7/13 (x1 dose) Zosyn 7/13 (x1 dose) Nafcillin 7/13 - 7/16 Cefepime 7/13 - 8/6 Vancomycin 7/13 (x1 dose); restarted 8/3 - 8/7 Tressie Ellis 8/6 >>> (plan to continue through 8/25 per ID recs)  SIGNIFICANT EVENTS: 7/17 - Transfer to ICU for precedex infusion 7/21 - Precedex off  7/25 - PCCM signed off 8/02 - Patient hypothermic & more agitated 8/03 - acute tachypnea w/ "guppy breathing" >> emergently intubated w/ bronchoscopy showing "mild diffuse alveolar hemorrhage" 8/05 - Right chest tube inserted w/ 350  mL serous fluid. Bicarb gtt started for acidosis 8/06 - Switched off Cefepime to [place] given potential for worsening encephalopathy  8/08 - Albumin w/ Lasix today. Improving UOP.  8/09 - Albumin w/ Lasix q6hr x3 doses. Precedex started in an attempt to wean Fentanyl drip.  8/11 - N/V of bilious emesis overnight & continued diuresis 8/12 - Continued diuresis w/ Lasix & added Aldactone daily. Patient apneic during SBT. Decreasing chest tube output. 8/16 diuretics backed down d/t hypernatremia   LINES/TUBES: OETT 8/3 >>>8/13 R CHEST TUBE 8/5 >>> RUE DL PICC 7/27 >>> Foley >>>  ASSESSMENT / PLAN: Treated Issues:  Acute hypoxic respiratory failure - Multifactorial.  Extubated 8/13 and tolerated well. Hemoptysis - Likely due to  cavitary pneumonia. Improving. No bright red blood. 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  MSSA & Pseudomonas Bacteremia/Endocarditis LUE Radial Vein DVT Residual ALI  Right Lower Extremity Cellulitis - Resolved.  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 Currently on fortaz through 8/25 per ID Transudative Right Pleural Effusion - S/P Chest tube placement. 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 Fluid and electrolyte imbalance  AKI improving/stable   Plan Can likely D/c chest tube today with <237m/24hrs for last 48 hrs will double check with Dr. sHalford Chessmangiven small (<5%) apical ptx  no guarantee that he wont require repeat thoracentesis at some point  Continue diuresis as Scr tol - lasix/aldactone  IF still no improvement AND/OR renal fxn starts to be more compromised we will need to give consideration to TIPS earlier  F/u CXR in am    Discussed at length with pt and dr. mDyann Kiefat bedside 8/27  KNickolas Madrid  NP 09/02/2016  9:51 AM Pager: [phone] or [phone]

## 2016-09-02 NOTE — Progress Notes (Signed)
Patient complaining of slight chest pain and shortness of breath. He did have chest tube removed earlier today so I checked vitals and they were okay. His oxygen level was 99% on room air and BP 113/73. Lung sounds diminished but present. Will continue to monitor.

## 2016-09-02 NOTE — Progress Notes (Signed)
PROGRESS NOTE  Johnathan Widmayer  LPN:[id number] DOB: [birth year] DOA: 07/19/2016   PCP: [name], MD   Brief Narrative:  46 yo Male PMHx Hep C and IVDA, Polysubstance Abuse, presented with one week duration of fevers, redness and swelling on the dorsum of the right foot. Pt with ongoing use of opioids, injected Opana 2 days PTA. Pt admitted with right foot cellulitis as well as RLL infiltrate, found to have MSSA bacteremia with possible Meningitis. On 7/17, pt developed acute encephalopathy with agitation, combative requiring precedex drip.   Subjective: Alert, awake and oriented 3. Afebrile denies shortness of breath. Patient with intermittent chest discomfort around chest tube. No nausea, no vomiting, no abdominal pain.  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 Continue pulmonary hygiene  -PCCM following for effusion and management of chest tube -Chest tube with excellent reduction in output (down to 160 mg); per PCCM planning attempt chest tube removal later today.  -CXR with stable small apical pneumothorax and just small pleural effusion.  -renal function stable; will continue current diuretics dose  -will follow up PCCM and GI rec's   MSSA and Pseudomonas bacteremia w/ sepsis/Tricuspid valve Endocarditis  -CSF culture negative -afebrile and w/o signs or symptoms for infection -patient non septic, no fever and has completed IV abx's  -will follow stability  -Repeat blood cultures has remained negative   Hepatitis C/positive  Hep B - liver cirrhosis  - Patient currently not candidate for treatment secondary to drug abuse - will need outpatient follow up with ID or hepatologist after proving abstinence for 6 months  LUE Radial Vein DVT  - Seen on duplex 7/24.  -No PE on CTA 7/24 -no swelling appreciated   Acute Encephalopathy -resolved and has remained stable  -multifactorial in etiology -will continue monitoring and keeping reorientation     Acute Renal Failure  -Cr  3.17 on admission: Secondary to dehydration.  -Cr 1.54 and stable -will continue current dose of diuretics -GFR remains stable  -follow renal function trend   Hypokalemia/hypomagnesemia -potassium stable -Magnesium slightly low today  -will monitor trend and replete further as needed   Hypernatremia -resolved now -will continue to monitor trend -maintaining adequate hydration and educated about low sodium diet.    Drug abuse and dependence  -cessation counseling provided -patient considering keeping himself clean -will provide with outpatient resources to help him with that task  Protein calorie malnutrition -will follow nutritional service rec's -continue feeding supplements  -patient encourage to follow diet as instructed   Thrombocytopenia, anemia -no signs of bleeding -continue monitoring trend -most likely associated with cirrhosis    DVT prophylaxis: SCD Code Status: Full Family Communication: no family at bedside Disposition Plan: pt with no insurance so can not go to [facility] or SNF apparently. Patient has completed antibiotic therapy and per discussion with the pulmonary service and the plan is to attempt chest tube removal later today. Finishing antibiotics today. Significant reduction seen on CT output. Renal function has remained stable. Patient tolerated adjusted dose of diuretics.    Consultants:  PCCM ID  GI  Procedures/Significant Events:  7/13 - Admit to [facility] 7/17 - Transfer to ICU for precedex  infusion 7/21 - Precedex off  7/22 - TRH resumed care 7/24 - venous duplex L UE radial vein DVT 7/25 - PCCM signed off 8/02 - Patient hypothermic & more agitated 8/03 - acute tachypnea w/ "guppy breathing" > emergently intubated w/ bronchoscopy showing "mild diffuse alveolar hemorrhage" 8/05 - Right chest tube inserted w/ 350 mL serous fluid. Bicarb gtt started for acidosis 8/06 - Switched off Cefepime to [place] given potential for worsening encephalopathy  8/08 - Albumin w/ Lasix today. Improving UOP.  8/09 - Albumin w/ Lasix q6hr x3 doses. Precedex started in an attempt to wean Fentanyl drip.  8/11 - N/V of bilious emesis overnight & continued diuresis 8/12 - Continued diuresis w/ Lasix & added Aldactone daily. Patient apneic during SBT. Decreasing chest tube output. 8/13 - extubated  8/22-8/23--> adjusting/maximizing diuretic dose for cirrhosis and hydrothorax.  8/27-->Trying to remove CT if possible.  RENAL U/S 7/13:   IMPRESSION: 1. Mild right pelvicaliectasis, with ureteral jet noted in the urinary bladder excluding significant ureteral obstruction. 2. Small left renal cyst. 3. Pleural effusions and abdominal ascites. LP 7/13:  Tube 1 - WBC 51 (62% lymph, 29% lymph, 9% mono), RBC 555, Protein 88 , & Glucose 52. / Tube 4 - WBC 180 (58% neutro, 31% lymph, 11% monocytes) & RBC 36. TTE 7/14:  LV normal in size with EF 55-60%. No regional wall motion abnormalities & grade 1 diastolic dysfunction. LA upper limits of normal in size & RA normal in size. RV normal in size and function. No aortic stenosis or regurgitation. Aortic root normal in size. No mitral stenosis or regurgitation. No pulmonic stenosis or regurgitation with poorly visualized valve. No tricuspid regurgitation. Medium sized mobile vegetation noted on tricuspid valve. No pericardial effusion. CT HEAD W/O 7/16:  Progressive atrophy since 2011.  No acute intracranial findings. No abnormal enhancement or vasogenic edema to suggest  septic emboli to the brain. VENOUS DUPLEX BILATERAL LOWER EXTREMITY 7/17:  No DVT or SVT.  CTA CHEST 7/24:  Negative for a pulmonary embolism. Extensive pleural-parenchymal lung disease bilaterally. Large right pleural effusion and moderate-sized left pleural effusion. Pleural-based nodular opacities could represent an infectious etiology but also raise concern for a neoplastic process. Concern for a cavitary lesion and necrotic tissue in the right lung. Consider sampling of the pleural fluid. Recommend follow-up CT when the pleural fluid and acute process have resolved to exclude neoplastic disease. Severe emphysematous disease. Cirrhosis with evidence for portal hypertension based on the splenomegaly and ascites. 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  VENOUS DUPLEX LUE 7/24:  Acute DVT involving small portion of left radial vein in mid-forearm.  CT HEAD W/O 7/26:  Normal head CT. Right Pleural Effusion 7/26:  1.5 L hazy yellow fluid by IR. Protein <3.0, Albumin <1.0, Amylase 39, Glucose 72, LDH 72, & WBC 2431 (11% lymph, 82% neutro, & 7% mono). Cytology negative for malignancy.  MRI BRAIN W/O 8/2:  Exam is limited to diffusion only. Negative for acute infarct. No area of restricted diffusion. EEG 8/2:  This EEG is abnormal due to moderate diffuse slowing of the background. Lingula BAL 8/3:  WBC 78 (39% lymph, 31% neutro, 30% mono). Reported DAH.  TTE 8/3:  LV normal in size with EF 55-60%. No regional wall motion abnormality & grade 1 diastolic dysfunction. LA & RA normal in size. RV normal in size and function. No  aortic stenosis or regurgitation. Aortic root normal in size. No mitral stenosis or regurgitation. No pulmonic stenosis. Trivial regurgitation. Trivial pericardial effusion as well. Notably no vegetations were seen on this exam. Right Pleural Effusion 8/5:  WBC 616 (1% lymph, 1% eos, & 98% neutro). PORT CXR 8/5:  Previously reviewed by me. Right-sided predominant  opacities. Right-sided chest tube in place. Endotracheal tube in good position. Enteric feeding tube in good position. Right upper extremity PICC line in good position. Complete Abdominal U/S 8/6: Stable cirrhotic changes involving the liver without definite focal hepatic lesion. Mild common bile duct dilatation without obvious common bile duct stone or intrahepatic biliary dilatation. Poor visualization of the pancreas. Increased echogenicity of both kidneys suggesting medical renal disease. No hydronephrosis. Large volume ascites and bilateral pleural effusions. Port CXR 8/7:  Previously reviewed by me. Hazy opacities bilateral lower lung zones. Endotracheal tube & enteric feeding tube in good position. Right chest tube in good position.  PORT CXR 8/11:  Personally reviewed by me. Patchy hazy opacities bilaterally which are improving. No appreciable residual pleural effusion on the right. Right-sided chest tube in good position. Endotracheal tube and right upper extremity central venous catheter in good position. Enteric feeding tube coursing below diaphragm. PORT ABD X-RAY 8/11: Paucity of bowel gas without evidence of enteric obstruction. Enteric tube tip and side port project over the expected location of the mid body of the stomach. CT abd / pelv 8/12 > persistent bilateral pleural effusion (though improved), 3cm irregular rounded density in RLL.  Cirrhosis with splenomegaly and ascites, anasarca. Inflammatory vs infectious process involving duodenum  Cultures Blood Cultures x2 7/12:  2/2 Bottles MSSA & 1/2 Bottles Pseudomonas aeruginosa  CSF Culture 7/13:  Negative  MRSA PCR 7/13:  Negative HIV 7/13:  Negative Respiratory Viral Panel PCR 7/13:  Negative  Urine Culture 7/13:  Multiple Species Present Blood Cultures x2 7/14:  1/2 Bottles MSSA Blood Culture x1 7/15:  Negative  MRSA PCR 7/17:  Negative  Urine Culture 7/17:  Negative  Urine Streptococcal Antigen 7/17:  Negative Urine Legionella  Antigen 7/17:  Negative  Right Pleural Fluid Culture 7/26 >>> Bacteria Negative / AFB pending / Fungus pending Lingula BAL 8/3:  Candida tropicalis  Blood Cultures x2 8/3:  Negative  Right Pleural Culture 8/5:  Negative  Blood Culture x2 8/6:  Negative   Antimicrobials: Anti-infectives    Start     Stop   08/22/16 1100  rifaximin (XIFAXAN) tablet 550 mg         08/19/16 1000  cefTAZidime (FORTAZ) 1 g in dextrose 5 % 50 mL IVPB     08/31/16 2359   08/12/16 1100  cefTAZidime (FORTAZ) 2 g in dextrose 5 % 50 mL IVPB  Status:  Discontinued     08/19/16 0555   08/11/16 1000  ceFEPIme (MAXIPIME) 1 g in dextrose 5 % 50 mL IVPB  Status:  Discontinued     08/12/16 0936   08/10/16 1500  vancomycin (VANCOCIN) 1,250 mg in sodium chloride 0.9 % 250 mL IVPB  Status:  Discontinued     08/11/16 0930   08/10/16 1000  ceFEPIme (MAXIPIME) 2 g in dextrose 5 % 50 mL IVPB  Status:  Discontinued     08/11/16 0930   08/09/16 1300  vancomycin (VANCOCIN) 1,500 mg in sodium chloride 0.9 % 500 mL IVPB     08/09/16 1602   07/22/16 1400  ceFEPIme (MAXIPIME) 2 g in dextrose 5 % 50 mL IVPB  Status:  Discontinued     08/09/16 1226   07/20/16 1500  ceFEPIme (MAXIPIME) 2 g in dextrose 5 % 50 mL IVPB  Status:  Discontinued     07/22/16 1122   07/19/16 2300  vancomycin (VANCOCIN) IVPB 750 mg/150 ml premix  Status:  Discontinued     07/19/16 0953   07/19/16 2200  ceFEPIme (MAXIPIME) 2 g in dextrose 5 % 50 mL IVPB  Status:  Discontinued     07/20/16 1410   07/19/16 2000  nafcillin 2 g in dextrose 5 % 100 mL IVPB  Status:  Discontinued     07/22/16 1130   07/19/16 1915  nafcillin injection 2 g  Status:  Discontinued     07/19/16 1915   07/19/16 1800  ceFEPIme (MAXIPIME) 2 g in dextrose 5 % 50 mL IVPB  Status:  Discontinued     07/19/16 1912   07/19/16 1100  vancomycin (VANCOCIN) 500 mg in sodium chloride 0.9 % 100 mL IVPB  Status:  Discontinued     07/19/16 1645   07/19/16 0800  piperacillin-tazobactam (ZOSYN) IVPB  3.375 g  Status:  Discontinued     07/19/16 1645   07/19/16 0200  cefTRIAXone (ROCEPHIN) 2 g in dextrose 5 % 50 mL IVPB     07/19/16 0259   07/19/16 0130  cefTRIAXone (ROCEPHIN) 1 g in dextrose 5 % 50 mL IVPB  Status:  Discontinued     07/19/16 0140   07/19/16 0115  piperacillin-tazobactam (ZOSYN) IVPB 3.375 g     07/19/16 0219   07/19/16 0115  vancomycin (VANCOCIN) IVPB 1000 mg/200 mL premix     07/19/16 0243     LINES / TUBES:  OETT 8/3 >>>8/13 Rt CHEST TUBE 8/5 >>>Presumably to be discontinued later today 8/27 RUE DL PICC 7/27 >>>  Continuous Infusions:  Objective: Vitals:   09/01/16 2055 09/02/16 0458 09/02/16 1305 09/02/16 1532  BP: 127/80 126/78 135/77 (!) 123/93  Pulse: (!) 111 88  (!) 109  Resp: _0 Temp: 97.7 F (36.5 C) 98 F (36.7 C)  98.4 F (36.9 C)  TempSrc: Oral Oral  Oral  SpO2: 99% 99%  100%  Weight:      Height:        Intake/Output Summary (Last 24 hours) at 09/02/16 1723 Last data filed at 09/02/16 1115  Gross per 24 hour  Intake             1193 ml  Output             3760 ml  Net            -2567 ml   Filed Weights   08/29/16 0500 08/30/16 0500 08/31/16 0500  Weight: 71.8 kg (158 lb 4.6 oz) 67.1 kg (147 lb 14.9 oz) 66.8 kg (147 lb 4.3 oz)   Physical Exam  Constitutional: afebrile, continue complaining of intermittent chest discomfort. No SOB, no nausea, no vomiting, no abd pain. Chest tube output 160 CVS: RRR, no rubs, no gallops, no murmur, no JVD.   Pulmonary: Decreased breath sounds at the right base, no wheezing, no crackles, no using accessory muscles. Normal respiratory effort.   Abdominal: Soft, nontender, nondistended, positive bowel sounds, no ascites Musculoskeletal: No swelling, no cyanosis, no clubbing; full range of motion, no joint erythema.    Psychiatric: Alert, awake and oriented 4, no agitation, no suicidal ideation or hallucinations present. Patient with pretty good insight and able to follow commands properly.  Data Reviewed: I have reviewed patient's blood work today, vital signs, discussed with patient at bedside  CBC:  Recent Labs Lab 08/29/16 0430 08/30/16 0309 08/31/16 0445 09/01/16 0347 09/02/16 0408  WBC 6.0 6.5 5.9 5.1 5.7  NEUTROABS 2.9 2.9 2.5 2.0 2.5  HGB 8.0* 8.0* 7.8* 8.0* 8.4*  HCT 25.1* 24.9* 24.0* 25.0* 26.1*  MCV 86.6 86.2 86.0 87.7 87.9  PLT 157 174 166 168 696   Basic Metabolic Panel:  Recent Labs Lab 08/26/16 1758 08/27/16 0434 08/27/16 1623  08/29/16 0430 08/30/16 0309 08/31/16 0445 09/01/16 0347 09/02/16 0408  NA 138 139 139  --  136 138 137 137 138  K 4.1 4.2 4.3  --  4.0 4.0 3.8 3.7 3.8  CL 109 112* 112*  --  109 107 105 103 104  CO2 _0 --  _1 GLUCOSE 80 75 88  --  84 76 90 132* 84  BUN _2 --  _3 22*  CREATININE 1.51* 1.51* 1.42*  --  1.41* 1.46* 1.59* 1.60* 1.54*  CALCIUM 7.5* 7.4* 7.7*  --  7.7* 7.8* 7.6* 7.7* 7.8*  MG  --  1.4*  --   < > 1.7 1.4* 1.5* 1.6* 1.4*  PHOS 3.3 3.4 3.6  --   --   --   --   --   --   < > = values in this interval not displayed.  Liver Function Tests:  Recent Labs Lab 08/26/16 1758 08/27/16 0434 08/27/16 1623  ALBUMIN 1.4* 1.4* 1.5*   Coagulation Profile:  Recent Labs Lab 08/27/16 0434 08/28/16 0446 08/29/16 0430  INR 1.32 1.19 1.37    Urine analysis:    Component Value Date/Time   COLORURINE RED (A) 08/22/2016 1015   APPEARANCEUR CLOUDY (A) 08/22/2016 1015   LABSPEC 1.009 08/22/2016 1015   PHURINE 6.0 08/22/2016 1015   GLUCOSEU (A) 08/22/2016 1015    TEST NOT REPORTED DUE TO COLOR INTERFERENCE OF URINE PIGMENT   HGBUR (A) 08/22/2016 1015    TEST NOT REPORTED DUE TO COLOR INTERFERENCE OF URINE PIGMENT   BILIRUBINUR (A) 08/22/2016 1015    TEST NOT REPORTED DUE TO COLOR INTERFERENCE OF URINE PIGMENT   KETONESUR (A) 08/22/2016 1015    TEST NOT REPORTED DUE TO COLOR INTERFERENCE OF URINE PIGMENT   PROTEINUR (A) 08/22/2016 1015    TEST NOT REPORTED DUE TO COLOR  INTERFERENCE OF URINE PIGMENT   NITRITE (A) 08/22/2016 1015    TEST NOT REPORTED DUE TO COLOR INTERFERENCE OF URINE PIGMENT   LEUKOCYTESUR (A) 08/22/2016 1015    TEST NOT REPORTED DUE TO COLOR INTERFERENCE OF URINE PIGMENT   Radiology Studies: Dg Chest 2 View  Result Date: 09/01/2016 CLINICAL DATA:  Chest tube placement, continued surveillance. EXAM: CHEST  2 VIEW COMPARISON:  08/28/2016 FINDINGS: RIGHT chest tube good position. Less than 5% RIGHT pneumothorax redemonstrated, similar to priors. Unchanged PICC line, tip in RIGHT atrium. BILATERAL pulmonary opacities without significant consolidation or edema. Small RIGHT effusion may be increased. Normal cardiomediastinal silhouette. IMPRESSION: RIGHT chest tube good position. Less than 5% RIGHT apical pneumothorax can still be visualized. Electronically Signed   By: Staci Righter M.D.   On: 09/01/2016 15:17    Scheduled Meds: . Chlorhexidine Gluconate Cloth  6 each Topical Daily  . feeding supplement  1 Container Oral BID BM  . folic acid  1 mg Oral Daily  . furosemide  50 mg Oral  BID  . Gerhardt's butt cream   Topical Daily  . hydrALAZINE  50 mg Oral Q8H  . magnesium oxide  400 mg Oral BID  . mouth rinse  15 mL Mouth Rinse q12n4p  . multivitamin  1 tablet Oral Daily  . pantoprazole  40 mg Oral Daily  . risperiDONE  0.5 mg Oral BID  . sodium chloride flush  10-40 mL Intracatheter Q12H  . sodium chloride flush  3 mL Intravenous Q12H  . spironolactone  125 mg Oral BID  . thiamine  100 mg Oral Daily   Continuous Infusions:    LOS: 45 days   Time spent: 25 minutes   Barton Dubois MD  [facility] Pager [phone]  If 7PM-7AM, please contact night-coverage [email or web address] Password [facility] 09/02/2016, 5:23 PM

## 2016-09-02 NOTE — Progress Notes (Signed)
PT Cancellation Note  Patient Details Name: Johnathan [name] MRN: [id number] DOB: 08-Aug-1970   Cancelled Treatment:    Reason Eval/Treat Not Completed: (P) Patient declined, no reason specified (Pt reports intolerable pain from chest tube site and adamantly refused therapy at this time.  Pt reports," If they remove this tube then I can walk tomorrow.")   [name] Artis Delay 09/02/2016, 11:32 AM  [name], PTA pager [phone]

## 2016-09-02 NOTE — Procedures (Signed)
Chest tube removal  The tube was clamped.  Site prepped w/ chlorhexidine Sutures cut Petroleum gauze applied and CT removed during exhalation.   Occlusive dressing applied.   Simonne Martinet ACNP-BC [facility] Pulmonary/Critical Care Pager # [phone] OR # [phone] if no answer

## 2016-09-03 ENCOUNTER — Inpatient Hospital Stay (HOSPITAL_COMMUNITY): Payer: Medicaid Other

## 2016-09-03 DIAGNOSIS — N179 Acute kidney failure, unspecified: Secondary | ICD-10-CM

## 2016-09-03 DIAGNOSIS — N189 Chronic kidney disease, unspecified: Secondary | ICD-10-CM

## 2016-09-03 LAB — CBC WITH DIFFERENTIAL/PLATELET
BASOS PCT: 1 %
Basophils Absolute: 0 10*3/uL (ref 0.0–0.1)
EOS ABS: 0.2 10*3/uL (ref 0.0–0.7)
EOS PCT: 3 %
HCT: 26.8 % — ABNORMAL LOW (ref 39.0–52.0)
Hemoglobin: 8.5 g/dL — ABNORMAL LOW (ref 13.0–17.0)
LYMPHS ABS: 2.4 10*3/uL (ref 0.7–4.0)
Lymphocytes Relative: 43 %
MCH: 28 pg (ref 26.0–34.0)
MCHC: 31.7 g/dL (ref 30.0–36.0)
MCV: 88.2 fL (ref 78.0–100.0)
MONO ABS: 0.5 10*3/uL (ref 0.1–1.0)
MONOS PCT: 9 %
Neutro Abs: 2.5 10*3/uL (ref 1.7–7.7)
Neutrophils Relative %: 44 %
Platelets: 163 10*3/uL (ref 150–400)
RBC: 3.04 MIL/uL — ABNORMAL LOW (ref 4.22–5.81)
RDW: 16.9 % — AB (ref 11.5–15.5)
WBC: 5.6 10*3/uL (ref 4.0–10.5)

## 2016-09-03 LAB — BASIC METABOLIC PANEL
Anion gap: 7 (ref 5–15)
BUN: 26 mg/dL — AB (ref 6–20)
CO2: 30 mmol/L (ref 22–32)
Calcium: 8 mg/dL — ABNORMAL LOW (ref 8.9–10.3)
Chloride: 99 mmol/L — ABNORMAL LOW (ref 101–111)
Creatinine, Ser: 1.58 mg/dL — ABNORMAL HIGH (ref 0.61–1.24)
GFR calc Af Amer: 59 mL/min — ABNORMAL LOW (ref >60)
GFR, EST NON AFRICAN AMERICAN: 51 mL/min — AB (ref >60)
Glucose, Bld: 114 mg/dL — ABNORMAL HIGH (ref 65–99)
Potassium: 4.1 mmol/L (ref 3.5–5.1)
SODIUM: 136 mmol/L (ref 135–145)

## 2016-09-03 LAB — MAGNESIUM: MAGNESIUM: 1.3 mg/dL — AB (ref 1.7–2.4)

## 2016-09-03 MED ORDER — FOLIC ACID 1 MG PO TABS: 1.0000 mg | ORAL_TABLET | Freq: Every day | ORAL | 1 refills | Status: AC

## 2016-09-03 MED ORDER — BOOST / RESOURCE BREEZE PO LIQD: 1.0000 | Freq: Two times a day (BID) | ORAL | 3 refills | Status: AC

## 2016-09-03 MED ORDER — SPIRONOLACTONE 25 MG PO TABS: 125.0000 mg | ORAL_TABLET | Freq: Two times a day (BID) | ORAL | 1 refills | Status: DC

## 2016-09-03 MED ORDER — FUROSEMIDE 20 MG PO TABS: 50.0000 mg | ORAL_TABLET | Freq: Two times a day (BID) | ORAL | 1 refills | Status: AC

## 2016-09-03 MED ORDER — PANTOPRAZOLE SODIUM 40 MG PO TBEC: 40.0000 mg | DELAYED_RELEASE_TABLET | Freq: Every day | ORAL | Status: AC

## 2016-09-03 MED ORDER — HYDRALAZINE HCL 50 MG PO TABS: 50.0000 mg | ORAL_TABLET | Freq: Three times a day (TID) | ORAL | 1 refills | Status: AC

## 2016-09-03 MED ORDER — MAGNESIUM OXIDE 400 (241.3 MG) MG PO TABS: 400.0000 mg | ORAL_TABLET | Freq: Two times a day (BID) | ORAL | 1 refills | Status: AC

## 2016-09-03 MED ORDER — ACETAMINOPHEN 325 MG PO TABS: 650.0000 mg | ORAL_TABLET | Freq: Four times a day (QID) | ORAL | 0 refills | Status: AC | PRN

## 2016-09-03 MED ORDER — PROSIGHT PO TABS: 1.0000 | ORAL_TABLET | Freq: Every day | ORAL | 0 refills | Status: AC

## 2016-09-03 MED ORDER — NICOTINE 21 MG/24HR TD PT24: 21.0000 mg | MEDICATED_PATCH | TRANSDERMAL | 1 refills | Status: AC

## 2016-09-03 NOTE — Progress Notes (Signed)
Nutrition Follow-up  DOCUMENTATION CODES:   Non-severe (moderate) malnutrition in context of chronic illness  INTERVENTION:   -D/c Boost Breeze po TID, each supplement provides 250 kcal and 9 grams of protein, due to poor acceptance -Continue MVI daily -Snacks TID  NUTRITION DIAGNOSIS:   Malnutrition (Moderate) related to chronic illness (polysubstance abuse) as evidenced by moderate depletion of body fat, moderate depletions of muscle mass, moderate to severe fluid accumulation.  Ongoing  GOAL:   Patient will meet greater than or equal to 90% of their needs  Progressing  MONITOR:   PO intake, Supplement acceptance, Labs, Weight trends, Skin, I & O's  REASON FOR ASSESSMENT:   Consult Enteral/tube feeding initiation and management  ASSESSMENT:   46 yo male with PMH of substance abuse, tobacco use, IV drug abuse, who was admitted on 7/13 with MSSA bacteremia, endocarditis, PNA, cellulitis of RLE.  8/16- transferred from ICU to surgical floor 8/17- pt refusing cortrak tube placement and medications 8/18- pt again refusing cortrak tube, advanced to a regular diet 8/25- completed IV antibiotics 8/27- chest tube d/c  Pt sleeping soundly at time of visit.   Pt's intake has improved; noted meal completion 50-100%. However, meal selections are limited- pt consuming mainly boiled eggs and peanut butter and jelly sandwiches. He has consumed approximately 1970 kcals and 45 grams of protein over the past 24 hours (94% estimated kcal needs and 50% of estimated protein needs). Pt is refusing Boost Breeze supplements and had refusing Ensure supplements in the past. RD will add between meals nourishments.   Noted 17% wt loss over the past week; noted -2.5 L over the past 24 hours and -9.6 L since admission.   Labs reviewed: Mg: 1.3.  Diet Order:  Diet Heart Room service appropriate? Yes; Fluid consistency: Thin  Skin:  Reviewed, no issues  Last BM:  09/02/16  Height:   Ht  Readings from Last 1 Encounters:  08/18/16 5\' 11"  (1.803 m)    Weight:   Wt Readings from Last 1 Encounters:  09/03/16 131 lb 9.6 oz (59.7 kg)    Ideal Body Weight:  78.2 kg  BMI:  Body mass index is 18.35 kg/m.  Estimated Nutritional Needs:   Kcal:  2100-2300  Protein:  90-110 gm  Fluid:  2.1-2.3 L  EDUCATION NEEDS:   No education needs identified at this time  Johnathan [name], RD, LDN, CDE Pager: [phone] After hours Pager: [phone]

## 2016-09-03 NOTE — Discharge Summary (Signed)
Physician Discharge Summary  Johnathan [name] ZOX:[id number] DOB: 1970-07-23 DOA: 07/19/2016  PCP: Karle Plumber, MD  Admit date: 07/19/2016 Discharge date: 09/03/2016  Time spent: 35 minutes  Recommendations for Outpatient Follow-up:  1. Repeat CBC to follow Hgb and platelets trend  2. Repeat CMET to follow LFT's, electrolytes and renal function  3. Repeat Mg level to follow trend  4. Patient needs repeat CXR in 2-3 weeks to follow complete resolution and no further re-accumulation of pleural effusion  5. Arrange referral with ID and hepatologist (further tx of chronic hepatitis infection and cirrhosis)   Discharge Diagnoses:  Principal Problem:   MSSA bacteremia Active Problems:   Sepsis (HCC)   UTI (urinary tract infection)   ARF (acute renal failure) (HCC)   Hyponatremia   Abnormal liver function   AKI (acute kidney injury) (HCC)   IVDU (intravenous drug user)   Meningitis   Pneumonia   Bacteremia due to Pseudomonas   Endocarditis of tricuspid valve   Bacteremia   Acute encephalopathy   Alcohol withdrawal syndrome with complication (HCC)   Opiate withdrawal (HCC)   Pleural effusion on right   Acute respiratory failure (HCC)   Anoxic brain injury (HCC)   Pressure injury of skin   Chest tube in place   Cirrhosis of liver with ascites (HCC)   Discharge Condition: stable and improved. Discharge home with HHPT and HHOT for adaptation/transitioning of care and evaluation/instruction to facilitate rehabilitation. PCP appointment to be arranged by CM.  Diet recommendation: heart healthy diet (low sodium)  Filed Weights   08/30/16 0500 08/31/16 0500 09/03/16 0500  Weight: 67.1 kg (147 lb 14.9 oz) 66.8 kg (147 lb 4.3 oz) 59.7 kg (131 lb 9.6 oz)    History of present illness:  46 yo Male PMHx Hep C and IVDA, Polysubstance Abuse, presented with one week duration of fevers, redness and swelling on the dorsum of the right foot. Pt with ongoing use of opioids, injected  Opana 2 days PTA. Pt admitted with right foot cellulitis as well as RLL infiltrate, found to have MSSA bacteremia with possible Meningitis. On 7/17, pt developed acute encephalopathy with agitation, combative requiring precedex drip.   Hospital Course:  RLL cavitary PNA /- septic emboli v/s aspiration -afebrile and with good O2 sat on RA -very little chest discomfort with deep inspiration -today's CXR (8/28) demonstrating just mild atelectasis at the base of his right lung and essentially good inflation.  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 Continue pulmonary hygiene  -PCCM assistance and recommendations appreciated; patient stable for discharge from their standpoint. CT removed on 8/27 and well tolerated.  -patient will need follow up CXR to assess for further pleural effusion re-accumulation  -renal function stable; will continue current diuretics dose  -will benefit of follow up with hepatologist as an outpatient.   MSSA and Pseudomonas bacteremia w/ sepsis/Tricuspid valve Endocarditis  -CSF culture negative -afebrile and w/o signs or symptoms for infection -patient completed IV abx's  -Repeat blood cultures has remained negative   Hepatitis C/positive Hep B - liver cirrhosis  - Patient currently not candidate for treatment secondary to drug abuse - will need outpatient follow up with ID or hepatologist after proving abstinence for 6  months  LUE Radial Vein DVT  -Seen on duplex 7/24.  -No PE on CTA 7/24 -no further swelling appreciated; decision was made not to use systemic anticoagulation, given high risk for bleeding and need for prolonged chest  tube.  Acute Encephalopathy -resolved and has remained stable  -multifactorial in etiology -infection clear, off meds that can altered mentation and patient encouraged to keep himself drugs free.  Acute Renal Failure  -Cr  3.17 on admission: Secondary to dehydration.  -Cr 1.58 and stable -will continue current dose of diuretics -GFR remains stable  -follow renal function trend with BMET at follow up.  Hypokalemia/hypomagnesemia -potassium stable -Magnesium also repleted -patient discharge on daily supplementation   Hypernatremia -resolved now -will recommend BMET at follow up to assess electrolytes  -maintaining adequate hydration and educated about low sodium diet.    Drug abuse and dependence  -cessation counseling provided -patient considering keeping himself clean -SW as provided outpatient resources to help him with that task  Protein calorie malnutrition -will follow nutritional service rec's -continue feeding supplements  -patient encourage to follow diet as instructed   Thrombocytopenia, anemia -no signs of bleeding -repeat CBC to follow platelet and Hgb trend -levels has remained stable.  -most likely associated with cirrhosis    Procedures/significant events: 7/13 - Admit to [facility] 7/17 - Transfer to ICU for precedex infusion 7/21 - Precedex off  7/22 - TRH resumed care 7/24 - venous duplex L UE radial vein DVT 7/25 - PCCM signed off 8/02 - Patient hypothermic & more agitated 8/03 - acute tachypnea w/ "guppy breathing" > emergently intubated w/ bronchoscopy showing "mild diffuse alveolar hemorrhage" 8/05 - Right chest tube inserted w/ 350 mL serous fluid. Bicarb gtt started for acidosis 8/06 - Switched off Cefepime to [place] given potential for worsening encephalopathy  8/08 - Albumin w/ Lasix today. Improving UOP.  8/09 - Albumin w/ Lasix q6hr x3 doses. Precedex started in an attempt to wean Fentanyl drip.  8/11 - N/V of bilious emesis overnight &  continued diuresis 8/12 - Continued diuresis w/ Lasix & added Aldactone daily. Patient apneic during SBT. Decreasing chest tube output. 8/13 - extubated  8/22-8/23--> adjusting/maximizing diuretic dose for cirrhosis and hydrothorax.  8/27-->chest tube removal  Consultations:  PCCM  GI  ID  Discharge Exam: Vitals:   09/03/16 0445 09/03/16 1438  BP: 119/81 115/70  Pulse: 85 84  Resp: 18 16  Temp: 98.2 F (36.8 C) 97.7 F (36.5 C)  SpO2: 100% 99%   Constitutional: afebrile, continue complaining of intermittent chest discomfort. No SOB, no nausea, no vomiting, no abd pain. Chest tube output 160 CVS: RRR, no rubs, no gallops, no murmur, no JVD.   Pulmonary: Decreased breath sounds at the right base, no wheezing, no crackles, no using accessory muscles. Normal respiratory effort.   Abdominal: Soft, nontender, nondistended, positive bowel sounds, no ascites Musculoskeletal: No swelling, no cyanosis, no clubbing; full range of motion, no joint erythema.    Psychiatric: Alert, awake and oriented 4, no agitation, no suicidal ideation or hallucinations present. Patient with pretty good insight and able to follow commands properly.      Discharge Instructions   Discharge Instructions    Diet - low sodium heart healthy    Complete by:  As directed    Discharge instructions    Complete by:  As directed    Take medications as prescribed  Follow up as instructed to establish care with primary care physician  Follow heart healthy diet (less than  1.8 gram of sodium per day) Maintain adequate hydration Stop smoking Stop alcohol consumption Stop recreational drugs     Current Discharge Medication List    START taking these medications   Details  acetaminophen (TYLENOL) 325 MG tablet Take 2 tablets (650 mg total) by mouth every 6 (six) hours as needed for mild pain, fever or headache. Qty: 40 tablet, Refills: 0    feeding supplement (BOOST / RESOURCE BREEZE) LIQD Take 1 Container  by mouth 2 (two) times daily between meals. Qty: 8000 mL, Refills: 3    folic acid (FOLVITE) 1 MG tablet Take 1 tablet (1 mg total) by mouth daily. Qty: 30 tablet, Refills: 1    furosemide (LASIX) 20 MG tablet Take 2.5 tablets (50 mg total) by mouth 2 (two) times daily. Qty: 150 tablet, Refills: 1    hydrALAZINE (APRESOLINE) 50 MG tablet Take 1 tablet (50 mg total) by mouth every 8 (eight) hours. Qty: 90 tablet, Refills: 1    magnesium oxide (MAG-OX) 400 (241.3 Mg) MG tablet Take 1 tablet (400 mg total) by mouth 2 (two) times daily. Qty: 60 tablet, Refills: 1    multivitamin (PROSIGHT) TABS tablet Take 1 tablet by mouth daily. Qty: 30 each, Refills: 0    nicotine (NICODERM CQ - DOSED IN MG/24 HOURS) 21 mg/24hr patch Place 1 patch (21 mg total) onto the skin daily. Qty: 30 patch, Refills: 1    pantoprazole (PROTONIX) 40 MG tablet Take 1 tablet (40 mg total) by mouth daily.    spironolactone (ALDACTONE) 25 MG tablet Take 5 tablets (125 mg total) by mouth 2 (two) times daily. Qty: 60 tablet, Refills: 1      CONTINUE these medications which have NOT CHANGED   Details  SUBOXONE 8-2 MG FILM Take 2 Film by mouth daily. Refills: 0      STOP taking these medications     amoxicillin-clavulanate (AUGMENTIN) 875-125 MG per tablet      cyclobenzaprine (FLEXERIL) 10 MG tablet      doxycycline (VIBRAMYCIN) 100 MG capsule      ibuprofen (ADVIL,MOTRIN) 800 MG tablet        Allergies  Allergen Reactions  . Ativan [Lorazepam]     Paradoxical Reaction    The results of significant diagnostics from this hospitalization (including imaging, microbiology, ancillary and laboratory) are listed below for reference.    Significant Diagnostic Studies: Ct Abdomen Pelvis Wo Contrast  Result Date: 08/18/2016 CLINICAL DATA:  Abnormal liver function studies and hyponatremia. MRSA bacteremia with possible meningitis and left lower extremity cellulitis. EXAM: CT ABDOMEN AND PELVIS WITHOUT  CONTRAST TECHNIQUE: Multidetector CT imaging of the abdomen and pelvis was performed following the standard protocol without IV contrast. COMPARISON:  None. FINDINGS: Lower chest: Persistent bilateral pleural effusions but slight improvement since the prior chest CT 07/30/2016. Stable changes of paraseptal emphysema. Irregular masslike area of consolidation in the right lower lobe anteriorly near the major fissure is likely an infiltrate but attention on future scans is suggested. Pulmonary infarct would be another possibility. Could not exclude a mass. New line persistent bilateral infiltrates and areas of atelectasis. Hepatobiliary: Examination limited by the lack of IV contrast and artifact from the patient's arms being down by his side. No obvious hepatic lesions. Irregular liver contour consistent with cirrhosis. There is associated splenomegaly and ascites. No intrahepatic biliary dilatation. The gallbladder appears grossly normal. No definite common bile duct dilatation. Pancreas: No obvious mass, inflammation or ductal dilatation. Spleen: Moderate splenomegaly. The spleen measures a maximum  of 16.8 cm. Adrenals/Urinary Tract: The adrenal glands and kidneys are grossly normal. No renal, ureteral or bladder calculi or mass. There is a Foley catheter noted in the bladder. Stomach/Bowel: The stomach appears grossly normal. There is an NG tube in place. There appears to be fairly marked inflammations/edema involving the second and third portions of the duodenum with mucosal fold thickening. The jejunum and ileum appear grossly normal. No obvious colonic abnormality. Vascular/Lymphatic: Scattered aortic calcifications but no aneurysm. Scattered mesenteric and retroperitoneal lymph nodes but no mass or overt adenopathy. There is diffuse mesenteric edema and moderate volume abdominal/ pelvic ascites. Reproductive: The prostate gland and seminal vesicles are grossly normal. Other: Diffuse body wall edema suggesting  anasarca. Musculoskeletal: No significant bony findings. IMPRESSION: 1. Persistent bilateral pleural effusions but overall slight improvement since the prior chest CT. There are also persistent but improved bibasilar infiltrates and atelectasis. 2. 3 cm irregular rounded density in the right lower lobe anteriorly, likely infiltrate or infarct but recommend attention on future scans. 3. Right-sided chest tube in good position. Tiny anterior pneumothorax. 4. Cirrhosis with splenomegaly and ascites. No obvious hepatic lesion but exam is quite limited. 5. Diffuse body wall edema suggesting anasarca. 6. Inflammatory or infectious process involving the duodenum. No small bowel obstruction or free air. Electronically Signed   By: Rudie Meyer M.D.   On: 08/18/2016 16:11   Dg Chest 2 View  Result Date: 09/03/2016 CLINICAL DATA:  Status post right chest tube removal EXAM: CHEST  2 VIEW COMPARISON:  09/01/2016 FINDINGS: Cardiac shadow is stable. The right chest tube has been removed in the interval. Some minimal atelectatic changes are noted along the minor fissure. Right PICC line is again seen and stable. No significant pneumothorax is seen on the right. Minimal atelectatic changes are noted in the left base. Patchy infiltrate is noted in the right lower lobe best seen on the lateral exam. IMPRESSION: No pneumothorax following chest tube removal. Right lower lobe infiltrate. Electronically Signed   By: Alcide Clever M.D.   On: 09/03/2016 08:16   Dg Chest 2 View  Result Date: 09/01/2016 CLINICAL DATA:  Chest tube placement, continued surveillance. EXAM: CHEST  2 VIEW COMPARISON:  08/28/2016 FINDINGS: RIGHT chest tube good position. Less than 5% RIGHT pneumothorax redemonstrated, similar to priors. Unchanged PICC line, tip in RIGHT atrium. BILATERAL pulmonary opacities without significant consolidation or edema. Small RIGHT effusion may be increased. Normal cardiomediastinal silhouette. IMPRESSION: RIGHT chest tube  good position. Less than 5% RIGHT apical pneumothorax can still be visualized. Electronically Signed   By: Elsie Stain M.D.   On: 09/01/2016 15:17   Ct Chest Wo Contrast  Result Date: 08/20/2016 CLINICAL DATA:  46 year old male with history of unresolved complicated cavitary pneumonia. Hemoptysis. Evaluate for potential empyema. EXAM: CT CHEST WITHOUT CONTRAST TECHNIQUE: Multidetector CT imaging of the chest was performed following the standard protocol without IV contrast. COMPARISON:  Chest CT 07/30/2016. FINDINGS: Cardiovascular: Heart size is normal. There is no significant pericardial fluid, thickening or pericardial calcification. There is aortic atherosclerosis, as well as atherosclerosis of the great vessels of the mediastinum and the coronary arteries, including calcified atherosclerotic plaque in the left anterior descending coronary artery. Right upper extremity PICC with tip terminating in the superior pericardial junction. Mediastinum/Nodes: Endotracheal tube with tip terminating 4.5 cm above the carina. Nasogastric tube extending into the stomach. No pathologically enlarged mediastinal or hilar lymph nodes. Please note that accurate exclusion of hilar adenopathy is limited on noncontrast CT scans. Esophagus is  unremarkable in appearance. No axillary lymphadenopathy. Lungs/Pleura: Large right and small left pleural effusions with extensive passive atelectasis in the lower lobes of the lungs bilaterally. Widespread airspace consolidation throughout the right upper and middle lobes with some scattered air-fluid levels which may reflect areas of cavitation or may simply reflect retained fluid within pre-existing bullae. The overall extent of airspace consolidation in the right upper lobe in particular has increased compared to prior chest CT 07/30/2016. No left-sided airspace consolidation is noted at this time. Previously noted pleural-based nodule in the periphery of the left upper lobe has  decreased in size, currently measuring 1.4 cm (axial image 103 of series 4), presumably infectious in etiology. Severe centrilobular and paraseptal emphysema with extensive bullous changes noted throughout the lung apices. Upper Abdomen: Shrunken appearance and nodular contour of the liver, suggesting underlying cirrhosis. Spleen is incompletely visualize, but appears enlarged. Large volume of ascites in the visualized upper abdomen. Musculoskeletal: There are no aggressive appearing lytic or blastic lesions noted in the visualized portions of the skeleton. IMPRESSION: 1. Worsening cavitary pneumonia in the right upper and right middle lobes, as above. 2. Large right pleural effusion and small left pleural effusion with extensive passive atelectasis in the lower lobes of the lungs bilaterally. Accurate assessment for empyema is limited on today's noncontrast examination. However, there is no definite gas in the right pleural space to strongly suggest empyema at this time. Additionally, the size of the right pleural effusion is very similar to the prior examination. 3. Diffuse bronchial wall thickening with severe centrilobular and paraseptal emphysema; imaging findings suggestive of underlying COPD. 4. Aortic atherosclerosis, in addition to left anterior descending coronary artery disease. Please note that although the presence of coronary artery calcium documents the presence of coronary artery disease, the severity of this disease and any potential stenosis cannot be assessed on this non-gated CT examination. Assessment for potential risk factor modification, dietary therapy or pharmacologic therapy may be warranted, if clinically indicated. 5. Morphologic changes in the liver suggestive of cirrhosis. Although incompletely visualized, there is likely associated splenomegaly. 6. Large volume of ascites noted in the visualized upper abdomen. Aortic Atherosclerosis (ICD10-I70.0) and Emphysema (ICD10-J43.9).  Electronically Signed   By: Trudie Reed M.D.   On: 08/20/2016 11:26   Mr Brain Wo Contrast  Result Date: 08/08/2016 CLINICAL DATA:  Acute encephalopathy.  Headache EXAM: MRI HEAD WITHOUT CONTRAST TECHNIQUE: Multiplanar, multiecho pulse sequences of the brain and surrounding structures were obtained without intravenous contrast. COMPARISON:  CT head 08/01/2016 FINDINGS: Incomplete study. The patient was not able to cooperate and complete the study. Axial and coronal diffusion-weighted imaging only was obtained and these are degraded by motion. No areas of restricted diffusion on diffusion-weighted imaging. Negative for hydrocephalus or fluid collection. Negative for mass lesion. IMPRESSION: Exam is limited to diffusion only. Negative for acute infarct. No area of restricted diffusion. Electronically Signed   By: Marlan Palau M.D.   On: 08/08/2016 16:43   US Abdomen Port  Result Date: 08/12/2016 CLINICAL DATA:  Acute on chronic renal failure. History of cirrhosis. EXAM: ABDOMEN ULTRASOUND COMPLETE COMPARISON:  Chest CT 08/10/2016 FINDINGS: Gallbladder: No gallstones or wall thickening visualized. No sonographic Murphy sign noted by sonographer. Common bile duct: Diameter: 8.2 mm Liver: Cirrhotic changes with a nodular contour or and heterogeneous echogenicity. No obvious hepatic lesion or intrahepatic biliary dilatation. IVC: Normal caliber. Pancreas: Poorly visualized. Spleen: Upper limits of normal in size.  No focal lesions. Right Kidney: Length: 14.5 cm. Increased echogenicity suggesting  medical renal disease. Normal renal cortical thickness without focal renal lesion. No hydronephrosis. Left Kidney: Length: 13.8 cm. Increased echogenicity but normal renal cortical thickness. No hydronephrosis. Abdominal aorta: Normal caliber Other findings: Large volume ascites and bilateral pleural effusions. IMPRESSION: 1. Stable cirrhotic changes involving the liver without definite focal hepatic lesion. 2. Mild  common bile duct dilatation without obvious common bile duct stone or intrahepatic biliary dilatation. 3. Poor visualization of the pancreas. 4. Increased echogenicity of both kidneys suggesting medical renal disease. No hydronephrosis. 5. Large volume ascites and bilateral pleural effusions. Electronically Signed   By: Rudie Meyer M.D.   On: 08/12/2016 16:30   Dg Chest Port 1 View  Result Date: 08/28/2016 CLINICAL DATA:  Pleural effusion.  Chest tube. EXAM: PORTABLE CHEST 1 VIEW COMPARISON:  08/23/2016. FINDINGS: Right PICC line and right chest tube in stable position. Tiny right apical pneumothorax on today's exam cannot be excluded. Heart size stable. COPD. Stable mild bibasilar atelectasis. IMPRESSION: 1. Right PICC line and right chest tube stable position. Tiny right apical pneumothorax cannot be excluded on today's exam. 2. Persistent mild bibasilar atelectasis. Small right pleural effusion cannot be excluded. Critical Value/emergent results were called by telephone at the time of interpretation on 08/28/2016 at 7:57 am to nurse Aurea Graff , who verbally acknowledged these results. Electronically Signed   By: Maisie Fus  Register   On: 08/28/2016 08:01   Dg Chest Port 1 View  Result Date: 08/23/2016 CLINICAL DATA:  08/22/2016. EXAM: PORTABLE CHEST 1 VIEW COMPARISON:  Chest x-ray 16 2018 . FINDINGS: Right PICC line right chest tube in stable position. Heart size normal. Mild subsegmental atelectasis and or infiltrate right mid lung. No pleural effusion or pneumothorax. IMPRESSION: Right PICC line right chest tube in stable position. No pneumothorax . Mild subsegmental atelectasis right mid lung. Electronically Signed   By: Maisie Fus  Register   On: 08/23/2016 09:52   Dg Chest Port 1 View  Result Date: 08/22/2016 CLINICAL DATA:  Respiratory failure EXAM: PORTABLE CHEST 1 VIEW COMPARISON:  08/21/2016 FINDINGS: Cardiac shadow is stable. Right-sided PICC line is noted in satisfactory position. Right chest tube is  noted as well. No pneumothorax is seen. Mild vascular congestion is again noted. Stable right basilar atelectasis is noted. No bony abnormality is seen. IMPRESSION: Stable appearance of the chest when compare with the previous day. Electronically Signed   By: Alcide Clever M.D.   On: 08/22/2016 11:17   Dg Chest Port 1 View  Result Date: 08/21/2016 CLINICAL DATA:  Respiratory failure. EXAM: PORTABLE CHEST 1 VIEW COMPARISON:  08/20/2016, 08/19/2016, 08/11/ 2018. CT 08/10/2016, 07/30/2016. FINDINGS: Right PICC line and chest tube in stable position. Cardiomegaly with pulmonary venous congestion. Bilateral pulmonary infiltrates/edema noted. COPD. Persistent atelectasis/infiltrate right lung base. No prominent pleural effusion. No pneumothorax. IMPRESSION: 1. Lines and tubes including right chest tube in stable position. No pneumothorax. 2. Cardiomegaly with pulmonary venous congestion and bilateral pulmonary infiltrates/ edema noted on today's exam. 3. COPD. Persistent atelectasis/infiltrate right lung base. Follow-up exams suggested demonstrate resolution. Pulmonary infarct or pulmonary mass cannot be excluded. Electronically Signed   By: Maisie Fus  Register   On: 08/21/2016 06:41   Dg Chest Port 1 View  Result Date: 08/21/2016 CLINICAL DATA:  Assess for pleural effusion.  Initial encounter. EXAM: PORTABLE CHEST 1 VIEW COMPARISON:  Chest radiograph performed earlier today at 10:44 a.m. FINDINGS: The right apical chest tube is unchanged in position. No definite pneumothorax is seen. Evaluation is somewhat suboptimal due to bulla at the lung  apices. Previously noted loculated fluid on the right is perhaps slightly less apparent. Mild vascular congestion is noted. No definite left-sided pleural effusion is seen. The cardiomediastinal silhouette is normal in size. A right PICC is noted ending about the distal SVC. No acute osseous abnormalities are seen. IMPRESSION: 1. Right apical chest tube is unchanged in position.  No pneumothorax seen. Evaluation is somewhat suboptimal due to bulla at the lung apices. 2. Previously noted loculated fluid on the right is perhaps slightly less apparent. Mild vascular congestion. No definite left-sided pleural effusion is now seen. Electronically Signed   By: Roanna Raider M.D.   On: 08/21/2016 00:23   Dg Chest Port 1 View  Result Date: 08/20/2016 CLINICAL DATA:  Pleural effusion.  Right chest tube. EXAM: PORTABLE CHEST 1 VIEW COMPARISON:  08/19/2016 and 08/18/2011 and chest CTs dated 08/10/2016 and 07/30/2016 FINDINGS: Endotracheal tube and OG tube have been removed. PICC and right chest tube appear in good position. No pneumothorax. Probable small amount of pleural fluid loculated along the fissures at the right lung base. Improved aeration at the right lung base. Persistent haziness at the left base probably represents a small posteriorly layered pleural effusion. There is also atelectasis at the left lung base posterior medially, unchanged. There is a masslike density in the right upper lobe medially. This could represent round atelectasis. I doubt that it represents a mass since that area was clear on chest x-ray of 07/19/2016. Heart size and pulmonary vascularity are normal. IMPRESSION: 1. Improved aeration at the right lung base since the prior study. 2. Removal of the endotracheal tube and OG tube. 3. Persistent loculated fluid on the right and probable small left effusion. 4. Persistent atelectasis at the left lung base posterior medially. 5. Chronic emphysematous changes. 6. Probable round atelectasis in the right upper lobe medially. 7. No pneumothorax. Electronically Signed   By: Francene Boyers M.D.   On: 08/20/2016 10:57   Dg Chest Port 1 View  Result Date: 08/19/2016 CLINICAL DATA:  Respiratory failure with hypoxia, smoker, history substance abuse EXAM: PORTABLE CHEST 1 VIEW COMPARISON:  Portable exam 0951 hours compared 08/17/2016 FINDINGS: 2 of endotracheal tube projects  3.9 cm above carina. Nasogastric tube extends into stomach. RIGHT arm PICC line tip projects over cavoatrial junction. RIGHT thoracostomy tube present. Upper normal size of cardiac silhouette. Scattered interstitial infiltrates with bibasilar atelectasis. Blebs at the apices. No definite pleural effusions or pneumothorax. IMPRESSION: Persistent pulmonary infiltrates with bibasilar atelectasis. Electronically Signed   By: Ulyses Southward M.D.   On: 08/19/2016 10:10   Dg Chest Port 1 View  Result Date: 08/17/2016 CLINICAL DATA:  Acute respiratory failure with hypoxia. Nausea and vomiting. EXAM: PORTABLE CHEST 1 VIEW COMPARISON:  One-view chest x-ray 08/13/2016 FINDINGS: The endotracheal tube is stable, 3.5 cm above the carina. NG tube courses off the inferior border the film. A right-sided PICC line is in place. Heart size is normal. Aeration of both lungs is improved. Residual edema is noted. Right greater than left pleural effusions are noted. Bibasilar airspace disease likely reflects atelectasis. IMPRESSION: 1. Improved lung volumes with persistent mild edema and bibasilar airspace disease, likely atelectasis. 2. Probable small effusions, worse on the right. Electronically Signed   By: Marin Roberts M.D.   On: 08/17/2016 10:01   Dg Chest Portable 1 View  Result Date: 08/13/2016 CLINICAL DATA:  Respiratory failure EXAM: PORTABLE CHEST 1 VIEW COMPARISON:  08/11/2016 FINDINGS: Tubular devices are stable. No pneumothorax. Low volumes. Patchy bilateral pulmonary opacities  stable. Right chest soft tissue emphysema resolved. IMPRESSION: Bilateral patchy airspace opacities are stable.  No pneumothorax. Electronically Signed   By: Jolaine Click M.D.   On: 08/13/2016 07:59   Dg Chest Port 1 View  Result Date: 08/11/2016 CLINICAL DATA:  Chest tube placement, substance abuse EXAM: PORTABLE CHEST 1 VIEW COMPARISON:  08/10/2016 chest CT and CXR FINDINGS: New right-sided chest tube is noted with tip projecting  adjacent to the right posterior sixth rib. Endotracheal, gastric and right subclavian central line tips are satisfactory in position. The tip of the gastric tube is excluded on current exam with tip and side-port that appear to be below the level of the left hemidiaphragm. Interval decrease in right effusion. Bibasilar atelectasis is noted. Bullous emphysematous changes are noted about the mediastinum. New right-sided subcutaneous emphysema from presumed chest tube placement. IMPRESSION: 1. New right-sided chest tube is noted with tip projecting along the upper margin of the posterior right sixth rib. Interval decrease in right-sided pleural effusion, now small. 2. No pneumothorax.  Bibasilar atelectasis. 3. COPD. 4. Satisfactory support line and tube positions. Electronically Signed   By: Tollie Eth M.D.   On: 08/11/2016 18:03   Portable Chest Xray  Result Date: 08/10/2016 CLINICAL DATA:  Pneumonia EXAM: PORTABLE CHEST 1 VIEW COMPARISON:  Yesterday FINDINGS: Endotracheal tube tip between the clavicular heads and carina. New orogastric tube which at least reaches the stomach. Right upper extremity PICC. New paramediastinal opacity at the right apex. This could be layering pleural fluid, newly opacified bulla, or atelectasis. Per chart, no interval central line attempt. Pleural effusion on the right where there is diffuse hazy opacity. Underlying lung opacification on the right. Emphysema. IMPRESSION: 1. New right upper paramediastinal opacity that could reflect layering fluid, newly opacified bulla, or atelectasis. No chart history of interval central line attempt. 2. Pleural effusion with atelectasis or pneumonia on the right. Electronically Signed   By: Marnee Spring M.D.   On: 08/10/2016 07:58   Portable Chest Xray  Result Date: 08/09/2016 CLINICAL DATA:  Hypoxia EXAM: PORTABLE CHEST 1 VIEW COMPARISON:  August 01, 2016. FINDINGS: Endotracheal tube tip is 3.6 cm above the carina. Central catheter tip is at  the cavoatrial junction. No pneumothorax. There is a the fairly small right pleural effusion. There is extensive airspace consolidation throughout much of the right lung. Left lung is clear except for mild perihilar atelectasis. Heart is mildly enlarged with pulmonary vascular within normal limits. No adenopathy. No evident bone lesions. IMPRESSION: Tube and catheter positions as described without pneumothorax. Small right pleural effusion with extensive airspace consolidation throughout much of the right lung. Slight left perihilar atelectasis. Stable cardiomegaly. Electronically Signed   By: Bretta Bang III M.D.   On: 08/09/2016 12:24   Dg Abd Portable 1v  Result Date: 08/17/2016 CLINICAL DATA:  Nausea and vomiting EXAM: PORTABLE ABDOMEN - 1 VIEW COMPARISON:  None. FINDINGS: Enteric tube tip and side port projected the expected location of the mid body of the stomach. Mild gaseous distention of the transverse colon. Otherwise, there is a paucity of bowel gas. No definite evidence of enteric obstruction. Nondiagnostic evaluation for pneumoperitoneum secondary supine positioning and exclusion of the lower thorax. No pneumatosis or portal venous gas. No definitive abnormal intra-abdominal calcifications. No acute osseus abnormalities. IMPRESSION: 1. Paucity of bowel gas without evidence of enteric obstruction. 2. Enteric tube tip and side port project over the expected location of the mid body of the stomach. Electronically Signed   By: Jonny Ruiz  Watts M.D.   On: 08/17/2016 09:57   Dg Abd Portable 1v  Result Date: 08/09/2016 CLINICAL DATA:  Orogastric tube placement. EXAM: PORTABLE ABDOMEN - 1 VIEW COMPARISON:  None. FINDINGS: Orogastric tube in good position with the tip in the antrum and the distal side port in the gastric body. The bowel gas pattern is normal. No radio-opaque calculi or other significant radiographic abnormality are seen. Right lower lobe pleural effusion and atelectasis. IMPRESSION:  Orogastric tube in appropriate position. Electronically Signed   By: Obie Dredge M.D.   On: 08/09/2016 15:13   Labs: Basic Metabolic Panel:  Recent Labs Lab 08/27/16 1623  08/30/16 0309 08/31/16 0445 09/01/16 0347 09/02/16 0408 09/03/16 0342  NA 139  < > 138 137 137 138 136  K 4.3  < > 4.0 3.8 3.7 3.8 4.1  CL 112*  < > 107 105 103 104 99*  CO2 22  < > 23 25 27 29 30   GLUCOSE 88  < > 76 90 132* 84 114*  BUN 13  < > 12 13 17  22* 26*  CREATININE 1.42*  < > 1.46* 1.59* 1.60* 1.54* 1.58*  CALCIUM 7.7*  < > 7.8* 7.6* 7.7* 7.8* 8.0*  MG  --   < > 1.4* 1.5* 1.6* 1.4* 1.3*  PHOS 3.6  --   --   --   --   --   --   < > = values in this interval not displayed.  Liver Function Tests:  Recent Labs Lab 08/27/16 1623  ALBUMIN 1.5*   CBC:  Recent Labs Lab 08/30/16 0309 08/31/16 0445 09/01/16 0347 09/02/16 0408 09/03/16 0342  WBC 6.5 5.9 5.1 5.7 5.6  NEUTROABS 2.9 2.5 2.0 2.5 2.5  HGB 8.0* 7.8* 8.0* 8.4* 8.5*  HCT 24.9* 24.0* 25.0* 26.1* 26.8*  MCV 86.2 86.0 87.7 87.9 88.2  PLT 174 166 168 177 163    Signed:  Vassie Loll MD.  [facility] 09/03/2016, 3:04 PM

## 2016-09-03 NOTE — Progress Notes (Signed)
Discharge home. Home discharge instruction given, no question verbalized. 

## 2016-09-03 NOTE — Care Management Note (Signed)
Case Management Note  Patient Details  Name: Johnathan [name] MRN: [id number] Date of Birth: 05-Sep-1970  Subjective/Objective:                    Action/Plan:  See Case Management note dated 09-02-16. Called [facility] for DME. MATCH letter given and explained. Patient voiced understanding. Expected Discharge Date:                  Expected Discharge Plan:  Home/Self Care  In-House Referral:  Financial Counselor, PCP / [facility]  Discharge planning Services  CM Consult, Medication Assistance, MATCH Program, Indigent Health Clinic  Post Acute Care Choice:  NA Choice offered to:  Patient  DME Arranged:  3-N-1, Shower stool, Walker rolling DME Agency:  [facility]  HH Arranged:  NA HH Agency:  NA  Status of Service:  Completed, signed off  If discussed at Long Length of Stay Meetings, dates discussed:    Additional Comments:  Kingsley Plan, RN 09/03/2016, 1:28 PM

## 2016-09-03 NOTE — Progress Notes (Signed)
PT Cancellation Note  Patient Details Name: Johnathan [name] MRN: [id number] DOB: 10-28-1970   Cancelled Treatment:    Reason Eval/Treat Not Completed: Other (comment); patient is going home this pm and awaiting IV team to remove PICC.  Discussed plans for assist from neighbors and safety techniques.    Elray Mcgregor 09/03/2016, 3:40 PM  Sheran Lawless, PT [phone] 09/03/2016

## 2016-09-03 NOTE — Progress Notes (Signed)
PCCM Progress Note  Subjective: Breathing okay.   Objective: BP 119/81 (BP Location: Left Arm)   Pulse 85   Temp 98.2 F (36.8 C) (Oral)   Resp 18   Ht 5\' 11"  (1.803 m)   Wt 131 lb 9.6 oz (59.7 kg)   SpO2 100%   BMI 18.35 kg/m   I/O last 3 completed shifts: In: 1543 [P.O.:1540; I.V.:3] Out: 5510 [Urine:5350; Chest Tube:160]   General - pleasant Eyes - pupils reactive ENT - no sinus tenderness, no oral exudate, no LAN Cardiac - regular, no murmur Chest - no wheeze, rales Abd - soft, non tender Ext - no edema Skin - no rashes Neuro - normal strength Psych - normal mood  Dg Chest 2 View  Result Date: 09/03/2016 CLINICAL DATA:  Status post right chest tube removal EXAM: CHEST  2 VIEW COMPARISON:  09/01/2016 FINDINGS: Cardiac shadow is stable. The right chest tube has been removed in the interval. Some minimal atelectatic changes are noted along the minor fissure. Right PICC line is again seen and stable. No significant pneumothorax is seen on the right. Minimal atelectatic changes are noted in the left base. Patchy infiltrate is noted in the right lower lobe best seen on the lateral exam. IMPRESSION: No pneumothorax following chest tube removal. Right lower lobe infiltrate. Electronically Signed   By: Alcide Clever M.D.   On: 09/03/2016 08:16   Dg Chest 2 View  Result Date: 09/01/2016 CLINICAL DATA:  Chest tube placement, continued surveillance. EXAM: CHEST  2 VIEW COMPARISON:  08/28/2016 FINDINGS: RIGHT chest tube good position. Less than 5% RIGHT pneumothorax redemonstrated, similar to priors. Unchanged PICC line, tip in RIGHT atrium. BILATERAL pulmonary opacities without significant consolidation or edema. Small RIGHT effusion may be increased. Normal cardiomediastinal silhouette. IMPRESSION: RIGHT chest tube good position. Less than 5% RIGHT apical pneumothorax can still be visualized. Electronically Signed   By: Elsie Stain M.D.   On: 09/01/2016 15:17    CMP Latest Ref Rng  & Units 09/03/2016 09/02/2016 09/01/2016  Glucose 65 - 99 mg/dL 242(A) 84 834(H)  BUN 6 - 20 mg/dL 96(Q) 22(L) 17  Creatinine 0.61 - 1.24 mg/dL 7.98(X) 2.11(H) 4.17(E)  Sodium 135 - 145 mmol/L 136 138 137  Potassium 3.5 - 5.1 mmol/L 4.1 3.8 3.7  Chloride 101 - 111 mmol/L 99(L) 104 103  CO2 22 - 32 mmol/L 30 29 27   Calcium 8.9 - 10.3 mg/dL 8.0(L) 7.8(L) 7.7(L)  Total Protein 6.5 - 8.1 g/dL - - -  Total Bilirubin 0.3 - 1.2 mg/dL - - -  Alkaline Phos 38 - 126 U/L - - -  AST 15 - 41 U/L - - -  ALT 17 - 63 U/L - - -    Assessment/plan:  Rt hepatic hydrothorax. - no issues after chest tube removal 8/27 - could do thoracentesis as needed for symptom relief if fluid reaccumulates - continue tx for cirrhosis  Cavitary pneumonia with tricuspid endocarditis. - per primary team  Emphysema. - smoking cessation  PCCM will sign off.  Please call if additional help needed.  Coralyn Helling, MD [facility] Pulmonary/Critical Care 09/03/2016, 8:52 AM Pager:  [phone] After 3pm call: [phone]

## 2016-09-13 NOTE — Progress Notes (Signed)
Pt's mom called and expressed concerns regarding discharge instructions and that her son was not feeling too well. Advised pt's mom  to go to the emergency room based on the seriousness of the condition she described

## 2016-09-14 LAB — ACID FAST CULTURE WITH REFLEXED SENSITIVITIES (MYCOBACTERIA)

## 2016-09-14 LAB — ACID FAST CULTURE WITH REFLEXED SENSITIVITIES: ACID FAST CULTURE - AFSCU3: NEGATIVE

## 2016-09-22 ENCOUNTER — Emergency Department (HOSPITAL_COMMUNITY): Payer: Medicaid Other

## 2016-09-22 ENCOUNTER — Inpatient Hospital Stay (HOSPITAL_COMMUNITY): Payer: Medicaid Other

## 2016-09-22 ENCOUNTER — Encounter (HOSPITAL_COMMUNITY): Payer: Self-pay | Admitting: *Deleted

## 2016-09-22 ENCOUNTER — Inpatient Hospital Stay (HOSPITAL_COMMUNITY)
Admission: EM | Admit: 2016-09-22 | Discharge: 2016-09-28 | DRG: 200 | Discharge status: Against Medical Advice | Payer: Medicaid Other | Attending: Internal Medicine | Admitting: Internal Medicine

## 2016-09-22 DIAGNOSIS — K7469 Other cirrhosis of liver: Secondary | ICD-10-CM | POA: Diagnosis present

## 2016-09-22 DIAGNOSIS — A419 Sepsis, unspecified organism: Secondary | ICD-10-CM

## 2016-09-22 DIAGNOSIS — Z5321 Procedure and treatment not carried out due to patient leaving prior to being seen by health care provider: Secondary | ICD-10-CM | POA: Diagnosis not present

## 2016-09-22 DIAGNOSIS — E46 Unspecified protein-calorie malnutrition: Secondary | ICD-10-CM | POA: Diagnosis present

## 2016-09-22 DIAGNOSIS — I959 Hypotension, unspecified: Secondary | ICD-10-CM | POA: Diagnosis present

## 2016-09-22 DIAGNOSIS — I4510 Unspecified right bundle-branch block: Secondary | ICD-10-CM | POA: Diagnosis present

## 2016-09-22 DIAGNOSIS — Z86718 Personal history of other venous thrombosis and embolism: Secondary | ICD-10-CM

## 2016-09-22 DIAGNOSIS — J439 Emphysema, unspecified: Secondary | ICD-10-CM | POA: Diagnosis present

## 2016-09-22 DIAGNOSIS — F17210 Nicotine dependence, cigarettes, uncomplicated: Secondary | ICD-10-CM | POA: Diagnosis present

## 2016-09-22 DIAGNOSIS — N179 Acute kidney failure, unspecified: Secondary | ICD-10-CM | POA: Diagnosis present

## 2016-09-22 DIAGNOSIS — N189 Chronic kidney disease, unspecified: Secondary | ICD-10-CM

## 2016-09-22 DIAGNOSIS — J9690 Respiratory failure, unspecified, unspecified whether with hypoxia or hypercapnia: Secondary | ICD-10-CM

## 2016-09-22 DIAGNOSIS — B181 Chronic viral hepatitis B without delta-agent: Secondary | ICD-10-CM | POA: Diagnosis present

## 2016-09-22 DIAGNOSIS — R739 Hyperglycemia, unspecified: Secondary | ICD-10-CM | POA: Diagnosis present

## 2016-09-22 DIAGNOSIS — D649 Anemia, unspecified: Secondary | ICD-10-CM | POA: Diagnosis present

## 2016-09-22 DIAGNOSIS — E872 Acidosis: Secondary | ICD-10-CM | POA: Diagnosis present

## 2016-09-22 DIAGNOSIS — I129 Hypertensive chronic kidney disease with stage 1 through stage 4 chronic kidney disease, or unspecified chronic kidney disease: Secondary | ICD-10-CM | POA: Diagnosis present

## 2016-09-22 DIAGNOSIS — Z888 Allergy status to other drugs, medicaments and biological substances status: Secondary | ICD-10-CM | POA: Diagnosis not present

## 2016-09-22 DIAGNOSIS — I079 Rheumatic tricuspid valve disease, unspecified: Secondary | ICD-10-CM | POA: Diagnosis present

## 2016-09-22 DIAGNOSIS — N183 Chronic kidney disease, stage 3 (moderate): Secondary | ICD-10-CM | POA: Diagnosis present

## 2016-09-22 DIAGNOSIS — B182 Chronic viral hepatitis C: Secondary | ICD-10-CM | POA: Diagnosis present

## 2016-09-22 DIAGNOSIS — I76 Septic arterial embolism: Secondary | ICD-10-CM | POA: Diagnosis present

## 2016-09-22 DIAGNOSIS — F1990 Other psychoactive substance use, unspecified, uncomplicated: Secondary | ICD-10-CM | POA: Diagnosis present

## 2016-09-22 DIAGNOSIS — E875 Hyperkalemia: Secondary | ICD-10-CM | POA: Diagnosis present

## 2016-09-22 DIAGNOSIS — Z681 Body mass index (BMI) 19 or less, adult: Secondary | ICD-10-CM

## 2016-09-22 DIAGNOSIS — Z79899 Other long term (current) drug therapy: Secondary | ICD-10-CM

## 2016-09-22 DIAGNOSIS — E871 Hypo-osmolality and hyponatremia: Secondary | ICD-10-CM | POA: Diagnosis present

## 2016-09-22 DIAGNOSIS — W109XXA Fall (on) (from) unspecified stairs and steps, initial encounter: Secondary | ICD-10-CM | POA: Diagnosis present

## 2016-09-22 DIAGNOSIS — J9382 Other air leak: Secondary | ICD-10-CM | POA: Diagnosis not present

## 2016-09-22 DIAGNOSIS — J939 Pneumothorax, unspecified: Principal | ICD-10-CM | POA: Diagnosis present

## 2016-09-22 DIAGNOSIS — Z9689 Presence of other specified functional implants: Secondary | ICD-10-CM

## 2016-09-22 DIAGNOSIS — E869 Volume depletion, unspecified: Secondary | ICD-10-CM | POA: Diagnosis present

## 2016-09-22 DIAGNOSIS — R188 Other ascites: Secondary | ICD-10-CM | POA: Diagnosis present

## 2016-09-22 DIAGNOSIS — I712 Thoracic aortic aneurysm, without rupture: Secondary | ICD-10-CM | POA: Diagnosis present

## 2016-09-22 DIAGNOSIS — F1910 Other psychoactive substance abuse, uncomplicated: Secondary | ICD-10-CM | POA: Diagnosis present

## 2016-09-22 DIAGNOSIS — K7460 Unspecified cirrhosis of liver: Secondary | ICD-10-CM | POA: Diagnosis present

## 2016-09-22 DIAGNOSIS — E860 Dehydration: Secondary | ICD-10-CM | POA: Diagnosis present

## 2016-09-22 DIAGNOSIS — I368 Other nonrheumatic tricuspid valve disorders: Secondary | ICD-10-CM

## 2016-09-22 LAB — HEPATIC FUNCTION PANEL
ALBUMIN: 2.8 g/dL — AB (ref 3.5–5.0)
ALT: 25 U/L (ref 17–63)
AST: 30 U/L (ref 15–41)
Alkaline Phosphatase: 176 U/L — ABNORMAL HIGH (ref 38–126)
BILIRUBIN DIRECT: 0.1 mg/dL (ref 0.1–0.5)
Indirect Bilirubin: 0.4 mg/dL (ref 0.3–0.9)
Total Bilirubin: 0.5 mg/dL (ref 0.3–1.2)
Total Protein: 7.5 g/dL (ref 6.5–8.1)

## 2016-09-22 LAB — BASIC METABOLIC PANEL
ANION GAP: 8 (ref 5–15)
BUN: 57 mg/dL — ABNORMAL HIGH (ref 6–20)
CALCIUM: 8.8 mg/dL — AB (ref 8.9–10.3)
CO2: 21 mmol/L — AB (ref 22–32)
CREATININE: 4.23 mg/dL — AB (ref 0.61–1.24)
Chloride: 98 mmol/L — ABNORMAL LOW (ref 101–111)
GFR, EST AFRICAN AMERICAN: 18 mL/min — AB (ref >60)
GFR, EST NON AFRICAN AMERICAN: 16 mL/min — AB (ref >60)
Glucose, Bld: 182 mg/dL — ABNORMAL HIGH (ref 65–99)
Potassium: 5.7 mmol/L — ABNORMAL HIGH (ref 3.5–5.1)
SODIUM: 127 mmol/L — AB (ref 135–145)

## 2016-09-22 LAB — I-STAT TROPONIN, ED: Troponin i, poc: 0 ng/mL (ref 0.00–0.08)

## 2016-09-22 LAB — CBC
HCT: 31.5 % — ABNORMAL LOW (ref 39.0–52.0)
Hemoglobin: 10.3 g/dL — ABNORMAL LOW (ref 13.0–17.0)
MCH: 27.7 pg (ref 26.0–34.0)
MCHC: 32.7 g/dL (ref 30.0–36.0)
MCV: 84.7 fL (ref 78.0–100.0)
PLATELETS: 195 10*3/uL (ref 150–400)
RBC: 3.72 MIL/uL — AB (ref 4.22–5.81)
RDW: 14.9 % (ref 11.5–15.5)
WBC: 12.1 10*3/uL — ABNORMAL HIGH (ref 4.0–10.5)

## 2016-09-22 LAB — URINALYSIS, ROUTINE W REFLEX MICROSCOPIC
BILIRUBIN URINE: NEGATIVE
Glucose, UA: NEGATIVE mg/dL
KETONES UR: NEGATIVE mg/dL
LEUKOCYTES UA: NEGATIVE
NITRITE: NEGATIVE
PROTEIN: 30 mg/dL — AB
SQUAMOUS EPITHELIAL / LPF: NONE SEEN
Specific Gravity, Urine: 1.006 (ref 1.005–1.030)
pH: 7 (ref 5.0–8.0)

## 2016-09-22 LAB — I-STAT CG4 LACTIC ACID, ED
LACTIC ACID, VENOUS: 3.02 mmol/L — AB (ref 0.5–1.9)
LACTIC ACID, VENOUS: 2.31 mmol/L — AB (ref 0.5–1.9)

## 2016-09-22 MED ORDER — FENTANYL CITRATE (PF) 100 MCG/2ML IJ SOLN: 100.0000 ug | Freq: Once | INTRAMUSCULAR | Status: DC

## 2016-09-22 MED ORDER — VANCOMYCIN HCL IN DEXTROSE 750-5 MG/150ML-% IV SOLN: 750.0000 mg | INTRAVENOUS | Status: DC

## 2016-09-22 MED ORDER — PIPERACILLIN-TAZOBACTAM IN DEX 2-0.25 GM/50ML IV SOLN
2.2500 g | Freq: Four times a day (QID) | INTRAVENOUS | Status: DC
  Filled 2016-09-22 (×2): qty 50

## 2016-09-22 MED ORDER — SODIUM CHLORIDE 0.9 % IV BOLUS (SEPSIS)
1000.0000 mL | Freq: Once | INTRAVENOUS | Status: AC
  Administered 2016-09-22: 1000 mL via INTRAVENOUS

## 2016-09-22 MED ORDER — VANCOMYCIN HCL IN DEXTROSE 1-5 GM/200ML-% IV SOLN
1000.0000 mg | Freq: Once | INTRAVENOUS | Status: AC
  Administered 2016-09-22: 1000 mg via INTRAVENOUS
  Filled 2016-09-22: qty 200

## 2016-09-22 MED ORDER — TRAMADOL HCL 50 MG PO TABS
50.0000 mg | ORAL_TABLET | Freq: Two times a day (BID) | ORAL | Status: DC | PRN
  Administered 2016-09-22 – 2016-09-23 (×2): 50 mg via ORAL
  Filled 2016-09-22 (×2): qty 1

## 2016-09-22 MED ORDER — LIDOCAINE HCL (PF) 1 % IJ SOLN: 30.0000 mL | Freq: Once | INTRAMUSCULAR | Status: DC

## 2016-09-22 MED ORDER — PIPERACILLIN-TAZOBACTAM 3.375 G IVPB 30 MIN
3.3750 g | Freq: Once | INTRAVENOUS | Status: AC
  Administered 2016-09-22: 3.375 g via INTRAVENOUS
  Filled 2016-09-22: qty 50

## 2016-09-22 MED ORDER — HEPARIN SODIUM (PORCINE) 5000 UNIT/ML IJ SOLN
5000.0000 [IU] | Freq: Three times a day (TID) | INTRAMUSCULAR | Status: DC
  Administered 2016-09-22 – 2016-09-27 (×11): 5000 [IU] via SUBCUTANEOUS
  Filled 2016-09-22 (×12): qty 1

## 2016-09-22 MED ORDER — FENTANYL CITRATE (PF) 100 MCG/2ML IJ SOLN
100.0000 ug | Freq: Once | INTRAMUSCULAR | Status: AC
  Administered 2016-09-22: 100 ug via INTRAVENOUS

## 2016-09-22 MED ORDER — FENTANYL CITRATE (PF) 100 MCG/2ML IJ SOLN
INTRAMUSCULAR | Status: AC
  Filled 2016-09-22: qty 4

## 2016-09-22 MED ORDER — SODIUM CHLORIDE 0.9 % IV SOLN: 250.0000 mL | INTRAVENOUS | Status: DC | PRN

## 2016-09-22 MED ORDER — LIDOCAINE HCL (PF) 1 % IJ SOLN
30.0000 mL | INTRAMUSCULAR | Status: AC
  Administered 2016-09-22: 30 mL
  Filled 2016-09-22: qty 30

## 2016-09-22 NOTE — Progress Notes (Addendum)
.   Pharmacy Antibiotic Note  Johnathan [name] is a 46 y.o. male admitted on 09/22/2016 with sepsis.  Pharmacy has been consulted for vancomycin/zosyn dosing. Patient was recently discharged on 8/28 after a 2 month stay in the hospital where he completed antibiotics for MSSA/Pseudomonas bacteremia w/ Tricuspid valve endocarditis. WBC today elevated at 12.1 and patient afebrile.   First doses to be given in the ED.  Zosyn 3.375 gm X1  Vancomycin 1000 mg X 1  Weight- 56.1 kg  Estimated CrCl ~ 17 ml/min  Plan: Vancomycin 1000 mg Load Then 750 mg every 48 hours Zosyn 2.25 gm every 6 hours  Monitor renal function, cultures, s/sx of infection      Temp (24hrs), Avg:98.5 F (36.9 C), Min:98.3 F (36.8 C), Max:98.7 F (37.1 C)   Recent Labs Lab 09/22/16 1428 09/22/16 1438  WBC 12.1*  --   CREATININE 4.23*  --   LATICACIDVEN  --  3.02*    CrCl cannot be calculated (Unknown ideal weight.).    Allergies  Allergen Reactions  . Ativan [Lorazepam]     Paradoxical Reaction    Antimicrobials this admission: 9/16 Vanc >> 9/16 Zosyn >>    Thank you for allowing pharmacy to be a part of this patient's care.  Della Goo, PharmD PGY2 Infectious Diseases Pharmacy Resident Pager: [phone]  09/22/2016 3:30 PM

## 2016-09-22 NOTE — ED Notes (Signed)
Dr. Vassie Loll returned call, No stepdown or tele.  Pt can go to any med-surg that accepts chest tube.  Call to patient placement, per Corrie Dandy she will review and return this nurse call with update.

## 2016-09-22 NOTE — ED Notes (Signed)
ED Provider at bedside. 

## 2016-09-22 NOTE — ED Notes (Signed)
Update given to [facility], Geophysicist/field seismologist.

## 2016-09-22 NOTE — H&P (Signed)
PULMONARY / CRITICAL CARE MEDICINE   Name: Johnathan [name] MRN: [id number] DOB: 09/21/1970    ADMISSION DATE:  09/22/2016 CONSULTATION DATE:  09/22/16  REFERRING MD:  Dr. Ethelda Chick   CHIEF COMPLAINT:  SOB, right sided chest pain  HISTORY OF PRESENT ILLNESS:   46 year old male who presented to [facility] ER on 9/16 with complaints of ongoing shortness of breath and right-sided chest pain.  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  There were initial concerns for potential meningitis however CSF cultures were negative. Repeat blood cultures prior to discharge were negative. Patient was identified to be hepatitis C and hepatitis B positive (felt not to be a candidate for treatment due to drug abuse). Hospital course was further complicated by left upper extremity radial vein DVT (PE negative, decision made not to use systemic anticoagulation given high risk for bleeding/prolonged chest tube), AKI, metabolic encephalopathy, protein calorie malnutrition and anemia.  The patient's family report that he has not felt well since discharge. He has had persistent shortness of breath and right-sided chest pain. The patient has had poor PO intake and almost daily nausea and vomiting (to the point that his 59 year old son brings him a popsicle and a trash can every morning). He reports a continued sensation of something stuck in the back of his throat, increased cough but no significant sputum production. Family also reports that he fell down the stairs striking his back. The patient filled discharge medications and has been taking them since discharge. He denies IV drug use but states he has used pain tablet on a couple of occasions due to chest pain (family reports he is pretty candidate regarding his IV drug  abuse).    Initial ER evaluation found him to be mildly hypotensive.  CXR revealed a moderate right sided pneumothorax.    PCCM consulted for Pneumothorax.   PAST MEDICAL HISTORY :  He  has a past medical history of Substance abuse.  PAST SURGICAL HISTORY: He  has a past surgical history that includes Incision and drainage of bilateral forearm abscesses (Bilateral); Knee arthroscopy; and IR THORACENTESIS ASP PLEURAL SPACE W/IMG GUIDE (08/01/2016).  Allergies  Allergen Reactions  . Ativan [Lorazepam]     Paradoxical Reaction    No current facility-administered medications on file prior to encounter.    Current Outpatient Prescriptions on File Prior to Encounter  Medication Sig  . folic acid (FOLVITE) 1 MG tablet Take 1 tablet (1 mg total) by mouth daily.  . furosemide (LASIX) 20 MG tablet Take 2.5 tablets (50 mg total) by mouth 2 (two) times daily.  . hydrALAZINE (APRESOLINE) 50 MG tablet Take 1 tablet (50 mg total) by mouth every 8 (eight) hours.  . magnesium oxide (MAG-OX) 400 (241.3 Mg) MG tablet Take 1 tablet (400 mg total) by mouth 2 (two) times daily.  Marland Kitchen acetaminophen (TYLENOL) 325 MG tablet Take 2 tablets (650 mg total) by mouth every 6 (six) hours as needed for mild pain, fever or headache. (Patient not taking: Reported on 09/22/2016)  . feeding supplement (BOOST / RESOURCE BREEZE) LIQD Take 1 Container by mouth 2 (two) times daily between meals. (Patient not taking: Reported on 09/22/2016)  . multivitamin (PROSIGHT) TABS tablet Take 1 tablet by mouth daily. (Patient not taking: Reported on 09/22/2016)  . nicotine (NICODERM CQ - DOSED IN MG/24 HOURS) 21 mg/24hr patch Place  1 patch (21 mg total) onto the skin daily. (Patient not taking: Reported on 09/22/2016)  . pantoprazole (PROTONIX) 40 MG tablet Take 1 tablet (40 mg total) by mouth daily. (Patient not taking: Reported on 09/22/2016)    FAMILY HISTORY:  His indicated that his mother is alive. He indicated that his father is  alive.    SOCIAL HISTORY: He  reports that he has been smoking Cigarettes.  He has a 7.50 pack-year smoking history. He has never used smokeless tobacco. He reports that he uses drugs, including Other-see comments. He reports that he does not drink alcohol.  REVIEW OF SYSTEMS:  POSITIVES IN BOLD Gen: Denies fever, chills, weight change, fatigue, night sweats HEENT: Denies blurred vision, double vision, hearing loss, tinnitus, sinus congestion, rhinorrhea, sore throat, neck stiffness, dysphagia PULM: Denies shortness of breath, cough, sputum production, hemoptysis, wheezing CV: Denies right sided chest pain, edema, orthopnea, paroxysmal nocturnal dyspnea, palpitations GI: Denies abdominal pain, nausea, vomiting, diarrhea, hematochezia, melena, constipation, change in bowel habits GU: Denies dysuria, hematuria, polyuria, oliguria, urethral discharge Endocrine: Denies hot or cold intolerance, polyuria, polyphagia or appetite change Derm: Denies rash, dry skin, scaling or peeling skin change.  Abrasion on back from fall Heme: Denies easy bruising, bleeding, bleeding gums Neuro: Denies headache, numbness, weakness, slurred speech, loss of memory or consciousness   SUBJECTIVE:   VITAL SIGNS: BP 98/77 (BP Location: Right Arm)   Pulse 97   Temp 98.3 F (36.8 C) (Rectal)   Resp 20   Wt 123 lb 9.6 oz (56.1 kg)   SpO2 100%   BMI 17.24 kg/m   HEMODYNAMICS:    VENTILATOR SETTINGS:    INTAKE / OUTPUT: No intake/output data recorded.  PHYSICAL EXAMINATION: General: thin adult male in NAD, family at bedside HEENT: MM pink/moist, no jvd PSY: anxious Neuro: AAOx4, speech clear, MAE CV: s1s2 rrr, no m/r/g PULM: even/non-labored, clear on L, bronchial breath sounds on R.  Prior to chest tube placement, patient had silver dollar size rounded area near chest tube that would pop out on exhalation ZO:XWRU, non-tender, bsx4 active  Extremities: warm/dry, no edema  Skin: no rashes or  lesions  LABS:  BMET  Recent Labs Lab 09/22/16 1428  NA 127*  K 5.7*  CL 98*  CO2 21*  BUN 57*  CREATININE 4.23*  GLUCOSE 182*    Electrolytes  Recent Labs Lab 09/22/16 1428  CALCIUM 8.8*    CBC  Recent Labs Lab 09/22/16 1428  WBC 12.1*  HGB 10.3*  HCT 31.5*  PLT 195    Coag's No results for input(s): APTT, INR in the last 168 hours.  Sepsis Markers  Recent Labs Lab 09/22/16 1438 09/22/16 1604  LATICACIDVEN 3.02* 2.31*    ABG No results for input(s): PHART, PCO2ART, PO2ART in the last 168 hours.  Liver Enzymes No results for input(s): AST, ALT, ALKPHOS, BILITOT, ALBUMIN in the last 168 hours.  Cardiac Enzymes No results for input(s): TROPONINI, PROBNP in the last 168 hours.  Glucose No results for input(s): GLUCAP in the last 168 hours.  Imaging Dg Chest 2 View  Result Date: 09/22/2016 CLINICAL DATA:  Shortness of breath. EXAM: CHEST  2 VIEW COMPARISON:  09/01/2016 FINDINGS: Normal heart size. No pleural effusion or edema. No airspace opacities. Advanced changes of emphysema identified. There is a moderate right-sided pneumothorax measuring 4 cm in thickness. The visualized osseous structures are unremarkable. IMPRESSION: Moderate volume right-sided pneumothorax Emphysema (ICD10-J43.9). Electronically Signed   By: Veronda Prude.D.  On: 09/22/2016 15:43     STUDIES:  CT Chest 9/16 >>  CULTURES: BCx2 9/16 >>   ANTIBIOTICS: Vanco 9/16 x1  Zosyn 9/16 x1   SIGNIFICANT EVENTS: 9/16  Admit with pneumothorax  LINES/TUBES: R Chest Tube 9/16 >>   DISCUSSION: 46 y/o M admitted with right sided pneumothorax   ASSESSMENT / PLAN:  PULMONARY A: Right Pneumothorax  ? Chest Wall Defect - see exam note Pleuritic Chest Pain  Emphysematous Disease  P:   Chest tube placed  Assess CT of chest to review chest wall defect / pneumothorax Follow CXR  O2 as needed to support sats > 90%  CARDIOVASCULAR A:  Tachycardia - likely reactive   P:  Monitor on Med-Surg  RENAL A:   AKI - in setting of volume depletion Mild Lactic Acidosis   P:   Trend BMP / urinary output Replace electrolytes as indicated Avoid nephrotoxic agents, ensure adequate renal perfusion  IVF @ 148ml/hr  Follow up lactate in am   GASTROINTESTINAL A:   Nausea / Vomiting  P:   PRN zofran  Clear liquid diet, advance as tolerated   HEMATOLOGIC A:   Mild Leukocytosis  P:  Monitor CBC  INFECTIOUS A:   Doubt Infectious Process  P:   Received ABX in ER, will hold further Reassess CBC in am and decide if needs further coverage  ENDOCRINE A:   Hyperglycemia   P:   Monitor glucose   NEUROLOGIC A:   Pain secondary to Chest Tube  P:   RASS goal: n/a Tylenol for pain    FAMILY  - Updates: Family updated at bedside  To TR[facility] in am 9/17  Canary Brim, NP-C [place] [facility] & [facility] Pgr: [phone] or if no answer [phone] 09/22/2016, 5:18 PM

## 2016-09-22 NOTE — ED Triage Notes (Signed)
Pt reports recently just discharged from hospital following two month stay. Reports having right chest tube placed while in hospital and states he is still having sob and not feeling any better. Has pain to right rib area and also had recent fall which made his pain and sob worse. Reports fever .

## 2016-09-22 NOTE — ED Notes (Signed)
Paged on call pulmonary

## 2016-09-22 NOTE — ED Notes (Signed)
Per 5W charge nurse pt not appropriate for floor.  Paged Merry Proud, NP.

## 2016-09-22 NOTE — ED Notes (Signed)
Per Dr. Linward Foster for pt to eat roast beef sub.

## 2016-09-22 NOTE — ED Provider Notes (Signed)
[facility]-EMERGENCY DEPT Provider Note   CSN: [id number] Arrival date & time: 09/22/16  1402   Level V caveat unstable vital signs  History   Chief Complaint Chief Complaint  Patient presents with  . Shortness of Breath  . Fever    HPI Johnathan [name] is a 46 y.o. male.He reports shortness of breath for several days, he states 5 or 6 days accompanied by vague abdominal discomfort. He vomited one time yesterday. Last bowel movement was yesterday, normal. He admits to fever 102 4 days ago and fever of 101 yesterday. He fell yesterday injuring his lower back. He reports that he slipped and fell. No other injury. Other associated symptoms include blood in urine for several days. He treated himself with Roxicet 2 days ago. He denies IV drug use for several months.  HPI  Past Medical History:  Diagnosis Date  . Substance abuse     Patient Active Problem List   Diagnosis Date Noted  . Acute on chronic renal failure (HCC)   . Cirrhosis of liver with ascites (HCC)   . Chest tube in place   . Pressure injury of skin 08/20/2016  . Acute respiratory failure (HCC)   . Anoxic brain injury (HCC)   . Pleural effusion on right   . Opiate withdrawal (HCC)   . Bacteremia 07/23/2016  . Acute encephalopathy   . Alcohol withdrawal syndrome with complication (HCC)   . 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 Bacteremia due to Pseudomonas 07/20/2016  . Sepsis (HCC) 07/19/2016  . UTI (urinary tract infection) 07/19/2016  . ARF (acute renal failure) (HCC) 07/19/2016  . Hyponatremia 07/19/2016  . Abnormal liver function 07/19/2016  . IVDU (intravenous drug user) 07/19/2016  . MSSA bacteremia 07/19/2016  . Meningitis 07/19/2016  . Pneumonia 07/19/2016  . AKI (acute kidney injury) (HCC)   . Chronic hepatitis C (HCC) 09/14/2014  . Substance abuse 09/14/2014  . Cigarette smoker 09/14/2014  . Poor dentition 09/14/2014    Past Surgical History:  Procedure Laterality Date  . Incision  and drainage of bilateral forearm abscesses Bilateral   . IR THORACENTESIS ASP PLEURAL SPACE W/IMG GUIDE  08/01/2016  . KNEE ARTHROSCOPY         Home Medications    Prior to Admission medications   Medication Sig Start Date End Date Taking? Authorizing Provider  acetaminophen (TYLENOL) 325 MG tablet Take 2 tablets (650 mg total) by mouth every 6 (six) hours as needed for mild pain, fever or headache. 09/03/16  Yes Vassie Loll, MD  folic acid (FOLVITE) 1 MG tablet Take 1 tablet (1 mg total) by mouth daily. 09/04/16  Yes Vassie Loll, MD  furosemide (LASIX) 20 MG tablet Take 2.5 tablets (50 mg total) by mouth 2 (two) times daily. 09/03/16  Yes Vassie Loll, MD  hydrALAZINE (APRESOLINE) 50 MG tablet Take 1 tablet (50 mg total) by mouth every 8 (eight) hours. 09/03/16  Yes Vassie Loll, MD  magnesium oxide (MAG-OX) 400 (241.3 Mg) MG tablet Take 1 tablet (400 mg total) by mouth 2 (two) times daily. 09/03/16  Yes Vassie Loll, MD  spironolactone (ALDACTONE) 100 MG tablet Take 100 mg by mouth 2 (two) times daily. 09/10/16  Yes [provider]  feeding supplement (BOOST / RESOURCE BREEZE) LIQD Take 1 Container by mouth 2 (two) times daily between meals. 09/04/16   Vassie Loll, MD  multivitamin (PROSIGHT) TABS tablet Take 1 tablet by mouth daily. 09/04/16   Vassie Loll, MD  nicotine (NICODERM CQ - DOSED IN  MG/24 HOURS) 21 mg/24hr patch Place 1 patch (21 mg total) onto the skin daily. 09/03/16 09/03/17  Vassie Loll, MD  pantoprazole (PROTONIX) 40 MG tablet Take 1 tablet (40 mg total) by mouth daily. 09/04/16   Vassie Loll, MD  SUBOXONE 8-2 MG FILM Take 2 Film by mouth daily. 07/02/16   [provider]    Family History History reviewed. No pertinent family history.  Social History Social History  Substance Use Topics  . Smoking status: Current Every Day Smoker    Packs/day: 0.50    Years: 15.00    Types: Cigarettes  . Smokeless tobacco: Never Used  . Alcohol use  No     Comment: seldom once a year      Allergies   Ativan [lorazepam]   Review of Systems Review of Systems  Unable to perform ROS: Unstable vital signs  Constitutional: Positive for fever.  Respiratory: Positive for shortness of breath.   Cardiovascular: Positive for chest pain.       Complains of pain at right lateral chest at site where he had chest tube recently  Gastrointestinal: Positive for abdominal pain and vomiting.       Vomited once yesterday. No nausea present  Genitourinary: Positive for hematuria.     Physical Exam Updated Vital Signs BP (!) 85/74 (BP Location: Left Arm)   Pulse (!) 106   Temp 98.3 F (36.8 C) (Rectal)   Resp (!) 22   SpO2 100%   Physical Exam  Constitutional:  Acutely and chronically ill-appearing  HENT:  Head: Normocephalic and atraumatic.  Eyes: Pupils are equal, round, and reactive to light. Conjunctivae are normal.  Neck: Neck supple. No tracheal deviation present. No thyromegaly present.  Cardiovascular: Regular rhythm.   No murmur heard. Tachycardic  Pulmonary/Chest: Breath sounds normal.  Sutured wound of right lateral chest. Tachypnea,  Abdominal: Soft. Bowel sounds are normal. He exhibits no distension. There is no tenderness.  Genitourinary: Penis normal.  Musculoskeletal: Normal range of motion. He exhibits no edema or tenderness.  Neurological: He is alert. Coordination normal.  Skin: Skin is dry. No rash noted.  Jaundice  Psychiatric: He has a normal mood and affect.  Nursing note and vitals reviewed.    ED Treatments / Results  Labs (all labs ordered are listed, but only abnormal results are displayed) Labs Reviewed  CBC - Abnormal; Notable for the following:       Result Value   WBC 12.1 (*)    RBC 3.72 (*)    Hemoglobin 10.3 (*)    HCT 31.5 (*)    All other components within normal limits  I-STAT CG4 LACTIC ACID, ED - Abnormal; Notable for the following:    Lactic Acid, Venous 3.02 (*)    All other  components within normal limits  CULTURE, BLOOD (ROUTINE X 2)  CULTURE, BLOOD (ROUTINE X 2)  BASIC METABOLIC PANEL  HEPATIC FUNCTION PANEL  URINALYSIS, ROUTINE W REFLEX MICROSCOPIC  I-STAT TROPONIN, ED  I-STAT CG4 LACTIC ACID, ED    EKG  EKG Interpretation  Date/Time:  Sunday September 22 2016 14:13:59 EDT Ventricular Rate:  110 PR Interval:  122 QRS Duration: 122 QT Interval:  360 QTC Calculation: 487 R Axis:   -24 Text Interpretation:  Sinus tachycardia Right bundle branch block Cannot rule out Inferior infarct , age undetermined Abnormal ECG tachycardia and peaked t waves new from previous Confirmed by Frederick Peers [phone]) on 09/22/2016 2:27:22 PM Also confirmed by Frederick Peers [phone]), editor Barbette Hair (  [id number])  on 09/22/2016 2:43:01 PM      Results for orders placed or performed during the hospital encounter of 09/22/16  Basic metabolic panel  Result Value Ref Range   Sodium 127 (L) 135 - 145 mmol/L   Potassium 5.7 (H) 3.5 - 5.1 mmol/L   Chloride 98 (L) 101 - 111 mmol/L   CO2 21 (L) 22 - 32 mmol/L   Glucose, Bld 182 (H) 65 - 99 mg/dL   BUN 57 (H) 6 - 20 mg/dL   Creatinine, Ser 9.56 (H) 0.61 - 1.24 mg/dL   Calcium 8.8 (L) 8.9 - 10.3 mg/dL   GFR calc non Af Amer 16 (L) >60 mL/min   GFR calc Af Amer 18 (L) >60 mL/min   Anion gap 8 5 - 15  CBC  Result Value Ref Range   WBC 12.1 (H) 4.0 - 10.5 K/uL   RBC 3.72 (L) 4.22 - 5.81 MIL/uL   Hemoglobin 10.3 (L) 13.0 - 17.0 g/dL   HCT 21.3 (L) 08.6 - 57.8 %   MCV 84.7 78.0 - 100.0 fL   MCH 27.7 26.0 - 34.0 pg   MCHC 32.7 30.0 - 36.0 g/dL   RDW 46.9 62.9 - 52.8 %   Platelets 195 150 - 400 K/uL  I-stat troponin, ED  Result Value Ref Range   Troponin i, poc 0.00 0.00 - 0.08 ng/mL   Comment 3          I-Stat CG4 Lactic Acid, ED  Result Value Ref Range   Lactic Acid, Venous 3.02 (HH) 0.5 - 1.9 mmol/L   Comment NOTIFIED PHYSICIAN   I-Stat CG4 Lactic Acid, ED  (not at  [facility])  Result Value Ref Range   Lactic Acid,  Venous 2.31 (HH) 0.5 - 1.9 mmol/L   Comment NOTIFIED PHYSICIAN    Dg Chest 2 View  Result Date: 09/22/2016 CLINICAL DATA:  Shortness of breath. EXAM: CHEST  2 VIEW COMPARISON:  09/01/2016 FINDINGS: Normal heart size. No pleural effusion or edema. No airspace opacities. Advanced changes of emphysema identified. There is a moderate right-sided pneumothorax measuring 4 cm in thickness. The visualized osseous structures are unremarkable. IMPRESSION: Moderate volume right-sided pneumothorax Emphysema (ICD10-J43.9). Electronically Signed   By: Signa Kell M.D.   On: 09/22/2016 15:43   Dg Chest 2 View  Result Date: 09/03/2016 CLINICAL DATA:  Status post right chest tube removal EXAM: CHEST  2 VIEW COMPARISON:  09/01/2016 FINDINGS: Cardiac shadow is stable. The right chest tube has been removed in the interval. Some minimal atelectatic changes are noted along the minor fissure. Right PICC line is again seen and stable. No significant pneumothorax is seen on the right. Minimal atelectatic changes are noted in the left base. Patchy infiltrate is noted in the right lower lobe best seen on the lateral exam. IMPRESSION: No pneumothorax following chest tube removal. Right lower lobe infiltrate. Electronically Signed   By: Alcide Clever M.D.   On: 09/03/2016 08:16   Dg Chest 2 View  Result Date: 09/01/2016 CLINICAL DATA:  Chest tube placement, continued surveillance. EXAM: CHEST  2 VIEW COMPARISON:  08/28/2016 FINDINGS: RIGHT chest tube good position. Less than 5% RIGHT pneumothorax redemonstrated, similar to priors. Unchanged PICC line, tip in RIGHT atrium. BILATERAL pulmonary opacities without significant consolidation or edema. Small RIGHT effusion may be increased. Normal cardiomediastinal silhouette. IMPRESSION: RIGHT chest tube good position. Less than 5% RIGHT apical pneumothorax can still be visualized. Electronically Signed   By: Elsie Stain M.D.   On:  09/01/2016 15:17   Dg Chest Port 1 View  Result  Date: 08/28/2016 CLINICAL DATA:  Pleural effusion.  Chest tube. EXAM: PORTABLE CHEST 1 VIEW COMPARISON:  08/23/2016. FINDINGS: Right PICC line and right chest tube in stable position. Tiny right apical pneumothorax on today's exam cannot be excluded. Heart size stable. COPD. Stable mild bibasilar atelectasis. IMPRESSION: 1. Right PICC line and right chest tube stable position. Tiny right apical pneumothorax cannot be excluded on today's exam. 2. Persistent mild bibasilar atelectasis. Small right pleural effusion cannot be excluded. Critical Value/emergent results were called by telephone at the time of interpretation on 08/28/2016 at 7:57 am to nurse Aurea Graff , who verbally acknowledged these results. Electronically Signed   By: Maisie Fus  Register   On: 08/28/2016 08:01   Chest x-ray viewed by me Radiology No results found.  Procedures Procedures (including critical care time)  Medications Ordered in ED Medications  sodium chloride 0.9 % bolus 1,000 mL (not administered)    And  sodium chloride 0.9 % bolus 1,000 mL (not administered)  piperacillin-tazobactam (ZOSYN) IVPB 3.375 g (not administered)  vancomycin (VANCOCIN) IVPB 1000 mg/200 mL premix (not administered)  Code sepsis called by me based on Sirs criteria tachycardia, tachypnea, hypotension. Unknown source of infection. Broad spectrum antibiotics ordered  I consulted intensive care unit team Dr Vassie Loll who will come to evaluate patient for admission and thoracostomy tube placement area   5:15 PM patient is resting comfortably and appears in no distress. After treatment with intravenous hydration and intravenous antibiotics  Sepsis - Repeat Assessment  Performed at:    545 pm  Vitals     Blood pressure 98/77, pulse 97, temperature 98.3 F (36.8 C), temperature source Rectal, resp. rate 20, weight 56.1 kg (123 lb 9.6 oz), SpO2 100 %.  Heart:     Regular rate and rhythm  Lungs:    CTA  Capillary Refill:   <2 sec  Peripheral  Pulse:   Radial pulse palpable  Skin:  JaundiceJaundice   Initial Impression / Assessment and Plan / ED Course  I have reviewed the triage vital signs and the nursing notes.  Pertinent labs & imaging results that were available during my care of the patient were reviewed by me and considered in my medical decision making (see chart for details).       Final Clinical Impressions(s) / ED Diagnoses  Diagnoses #1 right sided pneumothorax #2 sepsis #3 hypotension #4 acute renal failure #5 hyperkalemia #6 hyponatremia #7Anemia Final diagnoses:  None   CRITICAL CARE Performed by: Doug Sou Total critical care time: 60 minutes Critical care time was exclusive of separately billable procedures and treating other patients. Critical care was necessary to treat or prevent imminent or life-threatening deterioration. Critical care was time spent personally by me on the following activities: development of treatment plan with patient and/or surrogate as well as nursing, discussions with consultants, evaluation of patient's response to treatment, examination of patient, obtaining history from patient or surrogate, ordering and performing treatments and interventions, ordering and review of laboratory studies, ordering and review of radiographic studies, pulse oximetry and re-evaluation of patient's condition. New Prescriptions New Prescriptions   No medications on file    Doug Sou, MD 09/22/16 1756

## 2016-09-22 NOTE — ED Notes (Signed)
Pt attempted to urinate, unable to provide sample at this time. 

## 2016-09-22 NOTE — Procedures (Signed)
Chest Tube Insertion Procedure Note  Indications:  Clinically significant Pneumothorax  Pre-operative Diagnosis: Pneumothorax  Post-operative Diagnosis: Pneumothorax  Procedure Details  Informed consent was obtained for the procedure, including sedation.  Risks of lung perforation, hemorrhage, arrhythmia, and adverse drug reaction were discussed.   After sterile skin prep, using standard technique, a wayne pigtail tube was placed in the axillary area. Gush if air noted on entering the pleural space.  Air leak in the pleurovac  Estimated Blood Loss:  Minimal         Specimens:  None              Complications:  None; patient tolerated the procedure well.         Condition: Stable  Chilton Greathouse MD [facility] Pager [phone] If no answer or after 3pm call: [phone] 09/22/2016, 6:47 PM

## 2016-09-23 ENCOUNTER — Inpatient Hospital Stay (HOSPITAL_COMMUNITY): Payer: Medicaid Other

## 2016-09-23 ENCOUNTER — Encounter (HOSPITAL_COMMUNITY): Payer: Self-pay | Admitting: General Practice

## 2016-09-23 DIAGNOSIS — J869 Pyothorax without fistula: Secondary | ICD-10-CM

## 2016-09-23 DIAGNOSIS — E875 Hyperkalemia: Secondary | ICD-10-CM

## 2016-09-23 DIAGNOSIS — J9311 Primary spontaneous pneumothorax: Secondary | ICD-10-CM

## 2016-09-23 DIAGNOSIS — J439 Emphysema, unspecified: Secondary | ICD-10-CM

## 2016-09-23 LAB — BASIC METABOLIC PANEL
ANION GAP: 8 (ref 5–15)
Anion gap: 7 (ref 5–15)
BUN: 54 mg/dL — ABNORMAL HIGH (ref 6–20)
BUN: 49 mg/dL — ABNORMAL HIGH (ref 6–20)
CHLORIDE: 107 mmol/L (ref 101–111)
CHLORIDE: 106 mmol/L (ref 101–111)
CO2: 18 mmol/L — AB (ref 22–32)
CO2: 20 mmol/L — ABNORMAL LOW (ref 22–32)
Calcium: 8.2 mg/dL — ABNORMAL LOW (ref 8.9–10.3)
Calcium: 8.2 mg/dL — ABNORMAL LOW (ref 8.9–10.3)
Creatinine, Ser: 3.72 mg/dL — ABNORMAL HIGH (ref 0.61–1.24)
Creatinine, Ser: 3.48 mg/dL — ABNORMAL HIGH (ref 0.61–1.24)
GFR calc Af Amer: 21 mL/min — ABNORMAL LOW (ref >60)
GFR calc non Af Amer: 18 mL/min — ABNORMAL LOW (ref >60)
GFR calc non Af Amer: 20 mL/min — ABNORMAL LOW (ref >60)
GFR, EST AFRICAN AMERICAN: 23 mL/min — AB (ref >60)
GLUCOSE: 113 mg/dL — AB (ref 65–99)
Glucose, Bld: 111 mg/dL — ABNORMAL HIGH (ref 65–99)
POTASSIUM: 6.1 mmol/L — AB (ref 3.5–5.1)
POTASSIUM: 6.1 mmol/L — AB (ref 3.5–5.1)
SODIUM: 133 mmol/L — AB (ref 135–145)
Sodium: 133 mmol/L — ABNORMAL LOW (ref 135–145)

## 2016-09-23 LAB — CBC
HEMATOCRIT: 25.8 % — AB (ref 39.0–52.0)
Hemoglobin: 8.6 g/dL — ABNORMAL LOW (ref 13.0–17.0)
MCH: 27.9 pg (ref 26.0–34.0)
MCHC: 33.3 g/dL (ref 30.0–36.0)
MCV: 83.8 fL (ref 78.0–100.0)
Platelets: 179 10*3/uL (ref 150–400)
RBC: 3.08 MIL/uL — AB (ref 4.22–5.81)
RDW: 15.1 % (ref 11.5–15.5)
WBC: 8.4 10*3/uL (ref 4.0–10.5)

## 2016-09-23 LAB — MAGNESIUM: Magnesium: 1.8 mg/dL (ref 1.7–2.4)

## 2016-09-23 LAB — PHOSPHORUS: Phosphorus: 4.7 mg/dL — ABNORMAL HIGH (ref 2.5–4.6)

## 2016-09-23 LAB — LACTIC ACID, PLASMA: Lactic Acid, Venous: 0.8 mmol/L (ref 0.5–1.9)

## 2016-09-23 MED ORDER — SODIUM CHLORIDE 0.9 % IV SOLN
INTRAVENOUS | Status: DC
  Administered 2016-09-23 – 2016-09-24 (×3): via INTRAVENOUS

## 2016-09-23 MED ORDER — HYDROCODONE-ACETAMINOPHEN 5-325 MG PO TABS
1.0000 | ORAL_TABLET | Freq: Four times a day (QID) | ORAL | Status: DC | PRN
  Administered 2016-09-23 – 2016-09-28 (×15): 1 via ORAL
  Filled 2016-09-23 (×15): qty 1

## 2016-09-23 MED ORDER — SODIUM POLYSTYRENE SULFONATE 15 GM/60ML PO SUSP
30.0000 g | ORAL | Status: AC
  Administered 2016-09-23: 30 g via ORAL
  Filled 2016-09-23 (×2): qty 120

## 2016-09-23 NOTE — Progress Notes (Signed)
PROGRESS NOTE    Johnathan [name]  ZOX:[id number] DOB: [birth year] DOA: 09/22/2016 PCP: Karle Plumber, MD    Brief Narrative:  46 year old male who presented to hospital dyspnea and right-sided chest pain. 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 Was diagnosed with hepatitis C/B and acute left upper extremity deep vein thrombosis. He complained of persistent right-sided chest pain, poor oral intake, worsening weakness and deconditioning. On initial physical examination his blood pressure was 90/77, heart rate 97, temperature 98.3, respiratory 20, oxygen saturation 100%. Moist mucous membranes, lungs had bronchial breath sounds, decreased breath sounds on right, heart S1-S2 present rhythmic, no gallops or murmurs, abdomen soft nontender, no lower extremity edema. His chest x-ray showed right pneumothorax. EKG sinus rhythm with right bundle branch block.   Patient was admitted to the intensive care unit with the working diagnosis of acute spontaneous pneumothorax.   Assessment & Plan:   Active Problems:   Pneumothorax   1. Right pneumothorax, suspected spontaneous. Will continue oxymetry monitoring and supplemental 02 per [place], will continue oxymetry monitoring, follow chest film with improvement in lung expansion, will follow with surgery recommendations in regards of chest tube.   2. Acute kidney injury on stage 3 CKD, due to dehydration, complicated with hyperkalemia. Will continue hydration with IV fluids, ns at 100 cc per hour, will follow on renal panel in am, avoid hypotension or nephrotoxic medications. Start patient on kayexalate, will follow on renal panel this pm. Hold diuretics, at home on furosemide and aldactone. EKG with normal intervals and no peak t waves.   3. Hyperglycemia. Will continue glucose cover and  monitoring with sliding scale, capillary glucose  4. History of IV drug abuse. No signs of withdrawal, will continue neuro checks per unit protocol.   5. Hepatic cirrhosis, due to viral hepatitis. C and B. No stigmata of advance liver disease, clinical signs of ascites or encephalopathy. Mild elevation of INR with normal LFTs. Will need close follow up as outpatient.      DVT prophylaxis: enoxaparin  Code Status: full Family Communication: No family at the bedside Disposition Plan: Home   Consultants:     Procedures:     Antimicrobials:       Subjective: Patient with pain on right chest wall, dyspnea has improved, no nausea or vomiting. No abdominal pain.   Objective: Vitals:   09/22/16 2030 09/22/16 2131 09/23/16 0325 09/23/16 0536  BP: 111/73 115/74 115/78 111/73  Pulse: 77 73 76 69  Resp: Temp:  98 F (36.7 C) 98.6 F (37 C) 97.9 F (36.6 C)  TempSrc:  Oral Oral   SpO2: 100% 100% 100% 100%  Weight:        Intake/Output Summary (Last 24 hours) at 09/23/16 0942 Last data filed at 09/23/16 8119  Gross per 24 hour  Intake             1730 ml  Output              600 ml  Net             1130 ml   Filed Weights   09/22/16 1538  Weight: 56.1 kg (123 lb 9.6 oz)    Examination:  General: deconditioned and ill looking appearing.  Neurology: Awake and alert, non focal  E ENT: positive pallor, no icterus, oral mucosa moist  Cardiovascular: No JVD. S1-S2 present, rhythmic, no gallops, rubs, or murmurs. No lower extremity edema. Pulmonary: decreased breath sounds bilaterally at bases, adequate air movement, no wheezing, rhonchi or rales. Chest tube in place on right hemithorax with no air leak.  Gastrointestinal. Abdomen flat, no organomegaly, non tender, no rebound or guarding Skin. No rashes Musculoskeletal: no joint deformities     Data Reviewed: I have personally reviewed following labs and imaging studies  CBC:  Recent Labs Lab  09/22/16 1428 09/23/16 0313  WBC 12.1* 8.4  HGB 10.3* 8.6*  HCT 31.5* 25.8*  MCV 84.7 83.8  PLT 195 179   Basic Metabolic Panel:  Recent Labs Lab 09/22/16 1428 09/23/16 0313  NA 127* 133*  K 5.7* 6.1*  CL 98* 107  CO2 21* 18*  GLUCOSE 182* 113*  BUN 57* 54*  CREATININE 4.23* 3.72*  CALCIUM 8.8* 8.2*  MG  --  1.8  PHOS  --  4.7*   GFR: Estimated Creatinine Clearance: 19.9 mL/min (A) (by C-G formula based on SCr of 3.72 mg/dL (H)). Liver Function Tests:  Recent Labs Lab 09/22/16 1428  AST 30  ALT 25  ALKPHOS 176*  BILITOT 0.5  PROT 7.5  ALBUMIN 2.8*   No results for input(s): LIPASE, AMYLASE in the last 168 hours. No results for input(s): AMMONIA in the last 168 hours. Coagulation Profile: No results for input(s): INR, PROTIME in the last 168 hours. Cardiac Enzymes: No results for input(s): CKTOTAL, CKMB, CKMBINDEX, TROPONINI in the last 168 hours. BNP (last 3 results) No results for input(s): PROBNP in the last 8760 hours. HbA1C: No results for input(s): HGBA1C in the last 72 hours. CBG: No results for input(s): GLUCAP in the last 168 hours. Lipid Profile: No results for input(s): CHOL, HDL, LDLCALC, TRIG, CHOLHDL, LDLDIRECT in the last 72 hours. Thyroid Function Tests: No results for input(s): TSH, T4TOTAL, FREET4, T3FREE, THYROIDAB in the last 72 hours. Anemia Panel: No results for input(s): VITAMINB12, FOLATE, FERRITIN, TIBC, IRON, RETICCTPCT in the last 72 hours.    Radiology Studies: I have reviewed all of the imaging during this hospital visit personally     Scheduled Meds: . fentaNYL (SUBLIMAZE) injection  100 mcg Intravenous Once  . heparin  5,000 Units Subcutaneous Q8H  . sodium polystyrene  30 g Oral Q4H   Continuous Infusions: . sodium chloride       LOS: 1 day        Mauricio Annett Gula, MD [facility] Pager [phone]

## 2016-09-23 NOTE — Progress Notes (Signed)
There is an order to instill 30cc sterile normal saline into pigtail catheter of the chest tube every eight hours starting from 9/17 at 0000, I called Doctor Jamison Neighbor from critical care to walk me through on the phone since I have not done it before neither any of my co-worker has done it before, which he did and I repeated the process to him back on the phone whiles I was in the pt room, pt said he wants someone that is experience with that to flush it, I called rapid response said she has not done it either, called 2S and 6N  To find out if there is any nurse that can come up here to do it for the pt but did not find any, Doctor Jamison Neighbor has been made aware of pt non compliance, said he will let the attending physician no in the morning, charge nurse Cory Roughen was in the room with me when pt refused it, will continue to monitor pt

## 2016-09-23 NOTE — Progress Notes (Signed)
Pt told phlebotomist that he is having chest pain, went to pt room asked him to point to where he has the chest pain, pointed to the rt side lateral of chest where he has the chest tube, said pain rate is 9/10 nurse tech tammy was in the room to do EKG whiles I was in the room but pt refused said he just need pain med, vital signs are stable, PRN vicodin given will continue to monitor pt

## 2016-09-23 NOTE — Progress Notes (Signed)
Pt admitted from ED per stretcher accompanied by a nurse tech and pt family with newly chest tube placed for pneumothorax, on arrival to the floor pt fully alert and oriented self introduced to pt and family ID bracelet identified with pt vital signs are stable, fall risk assessment done, refused skin assessment site of chest tube dry,  Hooked to contineos suction at 20, call light and phone within reached will continue to monitor pt

## 2016-09-23 NOTE — H&P (Signed)
Patient unwilling to answer any questions at this time

## 2016-09-23 NOTE — Progress Notes (Signed)
I saw and evaluated the patient, I personally obtain the key portions of the history and physical exam, I reviewed the physician assistant student documentation and agree with the physician assistant student medical decision-making. Further assessment and plan are as follows:  Please see attendings daily progress note.  Hilari Wethington M.D. PROGRESS NOTE  Johnathan [name] ZOX:[id number] DOB: 12-Feb-1970 DOA: 09/22/2016 PCP: Karle Plumber, MD   LOS: 1 day   Brief Narrative / Interim history: Patient is 46 year old who was recently hospitalized and discharged on 09/03/16 for cellulitis, right lung pneumonia and endocarditis. During prior hospitalization, a chest tube was placed to drain fluid from the right lung.  The chest tube was removed before discharge and since then he has experienced persistent dyspnea, which brought him back to the ED on 09/22/16.  Chest xray revealed a moderate right-sided pneumothorax and patient was admitted.  A chest tube was placed successfully in right axillary area on 09/22/16 by critical care provider. Patient's medical history is significant for acute on chronic renal failure, sepsis, chronic hepatitis C, cirrhosis, endocarditis, UTI, IV drug use and cigarette smoking.   Assessment & Plan: Active Problems:   Pneumothorax  1. Spontaneous Pneumothorax - Repeat chest xray this morning showed a decrease in the right pneumothorax.  - O2 sats 100% on supplemental oxygen - Ultram PRN Q12H for moderate pain; Vicodin 5-325 mg PRN Q6H for severe pain - Repeat xray in AM  2. Acute Kidney Injury on chronic kidney disease stage 3  - Likely medication induced: patient was on lasix and aldactone at home for diuresis - Creatinine improved from 4.23 at time of admission to 3.72 this AM with bolus given yesterday in ED. - Begin Normal Saline 100 mL/hr maintenance fluids - Hold lasix and aldactone  3. Hyperkalemia - Likely also related to patient's home meds (lasix  and aldactone) leading rise in serum potassium - Potassium 6.1 this AM; which is increased from 5.7 yesterday - Begin Kayexalate  4. 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 Calorie malnutrition - On feeding supplements at home - Consult dietary to reassess nutritional status  6. Tobacco Cessation - Nicotine patch 21 mg/24 hr  DVT prophylaxis: heparin injections Code Status: FULL Family Communication: no family at bedside Disposition Plan: home  Consultants:   Pulmonary   Subjective: Patient is experiencing pain rated as 8 out of 10 at the chest tube site but his breathing is improved from yesterday. His pain is exacerbated with breathing, talking and coughing. His chest pain is only located around chest tube, there is no pain in the left or center of the chest. His cough has been non-productive. He denies fever, chills, headache, lightheadedness, confusion, nausea, vomiting, diarrhea, urinary symptoms or abdominal pain.   Objective: Vitals:   09/22/16 2030 09/22/16 2131 09/23/16 0325 09/23/16 0536  BP: 111/73 115/74 115/78 111/73  Pulse: 77 73 76 69  Resp: Temp:  98 F (36.7 C) 98.6 F (37 C) 97.9 F (36.6 C)  TempSrc:  Oral Oral   SpO2: 100% 100% 100% 100%  Weight:        Intake/Output Summary (Last 24 hours) at 09/23/16 1118 Last data filed at 09/23/16 8119  Gross per 24 hour  Intake             1730 ml  Output  600 ml  Net             1130 ml   Filed Weights   09/22/16 1538  Weight: 56.1 kg (123 lb 9.6 oz)    Examination:  Vitals:   09/22/16 2030 09/22/16 2131 09/23/16 0325 09/23/16 0536  BP: 111/73 115/74 115/78 111/73  Pulse: 77 73 76 69  Resp: Temp:  98 F (36.7 C) 98.6 F (37 C) 97.9 F (36.6 C)  TempSrc:  Oral Oral   SpO2: 100% 100% 100% 100%  Weight:        General: Patient appears thin;  sitting reclined and still in bed Respiratory: Breath sounds present in right and left lungs; slightly decreased in right lung compared to left; no crackles, rhonchi or wheezing Cardiovascular: regular rate and rhythm; no murmurs, rubs or gallops; S1 & S2 present Abdomen: bowel sounds present all 4 quadrants; no tenderness to palpation. Soft and non-distended Extremities: no lower extremity edema; radial and dorsal pedal pulses intact bilaterally.   Psychiatric: Alert and oriented to person, place and time; cooperative with exam; clear and concise thoughts and speech    Data Reviewed: I have independently reviewed following labs and imaging studies   CBC:  Recent Labs Lab 09/22/16 1428 09/23/16 0313  WBC 12.1* 8.4  HGB 10.3* 8.6*  HCT 31.5* 25.8*  MCV 84.7 83.8  PLT 195 179   Basic Metabolic Panel:  Recent Labs Lab 09/22/16 1428 09/23/16 0313  NA 127* 133*  K 5.7* 6.1*  CL 98* 107  CO2 21* 18*  GLUCOSE 182* 113*  BUN 57* 54*  CREATININE 4.23* 3.72*  CALCIUM 8.8* 8.2*  MG  --  1.8  PHOS  --  4.7*   GFR: Estimated Creatinine Clearance: 19.9 mL/min (A) (by C-G formula based on SCr of 3.72 mg/dL (H)). Liver Function Tests:  Recent Labs Lab 09/22/16 1428  AST 30  ALT 25  ALKPHOS 176*  BILITOT 0.5  PROT 7.5  ALBUMIN 2.8*   No results for input(s): LIPASE, AMYLASE in the last 168 hours. No results for input(s): AMMONIA in the last 168 hours. Coagulation Profile: No results for input(s): INR, PROTIME in the last 168 hours. Cardiac Enzymes: No results for input(s): CKTOTAL, CKMB, CKMBINDEX, TROPONINI in the last 168 hours. BNP (last 3 results) No results for input(s): PROBNP in the last 8760 hours. HbA1C: No results for input(s): HGBA1C in the last 72 hours. CBG: No results for input(s): GLUCAP in the last 168 hours. Lipid Profile: No results for input(s): CHOL, HDL, LDLCALC, TRIG, CHOLHDL, LDLDIRECT in the last 72 hours. Thyroid Function Tests: No results  for input(s): TSH, T4TOTAL, FREET4, T3FREE, THYROIDAB in the last 72 hours. Anemia Panel: No results for input(s): VITAMINB12, FOLATE, FERRITIN, TIBC, IRON, RETICCTPCT in the last 72 hours. Urine analysis:    Component Value Date/Time   COLORURINE YELLOW 09/22/2016 1916   APPEARANCEUR CLEAR 09/22/2016 1916   LABSPEC 1.006 09/22/2016 1916   PHURINE 7.0 09/22/2016 1916   GLUCOSEU NEGATIVE 09/22/2016 1916   HGBUR LARGE (A) 09/22/2016 1916   BILIRUBINUR NEGATIVE 09/22/2016 1916   KETONESUR NEGATIVE 09/22/2016 1916   PROTEINUR 30 (A) 09/22/2016 1916   NITRITE NEGATIVE 09/22/2016 1916   LEUKOCYTESUR NEGATIVE 09/22/2016 1916   Sepsis Labs: Invalid input(s): PROCALCITONIN, LACTICIDVEN  No results found for this or any previous visit (from the past 240 hour(s)).    Radiology Studies: Dg Chest 2 View  Result Date: 09/22/2016 CLINICAL DATA:  Shortness  of breath. EXAM: CHEST  2 VIEW COMPARISON:  09/01/2016 FINDINGS: Normal heart size. No pleural effusion or edema. No airspace opacities. Advanced changes of emphysema identified. There is a moderate right-sided pneumothorax measuring 4 cm in thickness. The visualized osseous structures are unremarkable. IMPRESSION: Moderate volume right-sided pneumothorax Emphysema (ICD10-J43.9). Electronically Signed   By: Signa Kell M.D.   On: 09/22/2016 15:43   Ct Chest Wo Contrast  Result Date: 09/22/2016 CLINICAL DATA:  Recurrent pneumothorax. Patient has been dyspneic for several days accompanied by vague abdominal discomfort. Low-grade fevers 4 days ago and yesterday. Patient fell yesterday injuring lower back. EXAM: CT CHEST WITHOUT CONTRAST TECHNIQUE: Multidetector CT imaging of the chest was performed following the standard protocol without IV contrast. COMPARISON:  08/10/2016 FINDINGS: Cardiovascular: Heart is normal in size without pericardial effusion or thickening. Minimal LAD coronary arteriosclerosis. Mild dilatation of the un opacified main  pulmonary artery consistent with chronic pulmonary hypertension up to 3.1 cm in caliber. 4.2 cm ascending thoracic aortic aneurysm. No aortic arch or descending thoracic aortic aneurysm. Mediastinum/Nodes: Limited by lack of IV contrast. No mediastinal adenopathy. The hila is limited in assessment due to the noncontrast study. The esophagus, trachea mainstem bronchi are unremarkable. Lungs/Pleura: Upper lobe predominant bullous, paraseptal and centrilobular emphysema is noted bilaterally with an approximately 30% right apical pneumothorax with chest tube in place. Pneumothorax is also seen along the right lung base with thinly septated paraseptal emphysematous changes along the periphery of the right middle and lower lobes. Partially opacified right upper lobe bulla measuring 2.2 x 1.8 x 1.7 cm is noted possibly residual from previous pneumonia. Suggest attention on follow-up. No significant pleural effusion. Upper Abdomen: Mild stable splenomegaly. No adrenal masses. The unenhanced liver, included kidneys and gallbladder are nonacute. Musculoskeletal: No chest wall mass or suspicious bone lesions identified. Subcutaneous emphysema secondary to chest tube placement along the right hemithorax. IMPRESSION: 1. Severe upper lobe predominant centrilobular, paraseptal and bullous emphysematous disease of both lungs with right-sided pneumothorax estimated at 30%. A right-sided chest tube is seen within the pneumothorax. No tension noted. 2. 2.2 x 1.8 x 1.7 cm partially opacified right upper lobe bulla possibly stigmata of old prior pneumonic consolidation and residual debris within the bulla. A pulmonary mass is believed less likely. There has been marked clearing of previously noted pneumonic consolidations involving the right lung as well as clearing of bilateral pleural effusions. 3. Thoracic ascending aortic aneurysm at 4.2 cm in caliber. Recommend annual imaging followup by CTA or MRA. This recommendation follows 2010  ACCF/AHA/AATS/ACR/ASA/SCA/SCAI/SIR/STS/SVM Guidelines for the Diagnosis and Management of Patients with Thoracic Aortic Disease. Circulation. 2010; 121: Z610-R604 4. Mild splenomegaly. Aortic Atherosclerosis (ICD10-I70.0) and Emphysema (ICD10-J43.9). Electronically Signed   By: Tollie Eth M.D.   On: 09/22/2016 20:57   Dg Chest Port 1 View  Result Date: 09/23/2016 CLINICAL DATA:  Pneumothorax EXAM: PORTABLE CHEST 1 VIEW COMPARISON:  09/22/2016 FINDINGS: Right chest tube remains in place. Improving right pneumothorax. Possible tiny residual right apical pneumothorax and right lateral basilar pneumothorax. No confluent airspace opacities. Heart is normal size. Subcutaneous emphysema noted in the right chest wall, stable. IMPRESSION: Decreasing right pneumothorax with suspected tiny residual right pneumothorax. Electronically Signed   By: Charlett Nose M.D.   On: 09/23/2016 07:44   Dg Chest Port 1 View  Result Date: 09/22/2016 CLINICAL DATA:  Evaluate right pneumothorax following right thoracostomy tube placement. EXAM: PORTABLE CHEST 1 VIEW COMPARISON:  09/22/2016 and prior radiographs FINDINGS: There has been interval placement of a  right thoracostomy catheter. Right pneumothorax has decreased with small lateral basilar component remaining. The cardiomediastinal silhouette is unremarkable. The left lung is clear. Emphysema changes again identified. IMPRESSION: Right thoracostomy catheter placement with decrease in right pneumothorax, now small and primarily in the lateral basilar region. Emphysema (ICD10-J43.9). Electronically Signed   By: Harmon Pier M.D.   On: 09/22/2016 18:59     Scheduled Meds: . fentaNYL (SUBLIMAZE) injection  100 mcg Intravenous Once  . heparin  5,000 Units Subcutaneous Q8H  . sodium polystyrene  30 g Oral Q4H   Continuous Infusions: . sodium chloride    . sodium chloride 100 mL/hr at 09/23/16 1031      [facility], PA-Student [employer] 09/23/2016, 11:18 AM

## 2016-09-23 NOTE — Progress Notes (Signed)
PULMONARY / CRITICAL CARE MEDICINE   Name: Parag Dorton MRN: [id number] DOB: 1970/02/08    ADMISSION DATE:  09/22/2016 CONSULTATION DATE:  09/22/16  REFERRING MD:  Dr. Ethelda Chick   CHIEF COMPLAINT:  SOB, right sided chest pain  HISTORY OF PRESENT ILLNESS:   46 year old male who presented to [facility] ER on 9/16 with complaints of ongoing shortness of breath and right-sided chest pain.  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  There were initial concerns for potential meningitis however CSF cultures were negative. Repeat blood cultures prior to discharge were negative. Patient was identified to be hepatitis C and hepatitis B positive (felt not to be a candidate for treatment due to drug abuse). Hospital course was further complicated by left upper extremity radial vein DVT (PE negative, decision made not to use systemic anticoagulation given high risk for bleeding/prolonged chest tube), AKI, metabolic encephalopathy, protein calorie malnutrition and anemia.  The patient's family report that he has not felt well since discharge. He has had persistent shortness of breath and right-sided chest pain. The patient has had poor PO intake and almost daily nausea and vomiting (to the point that his 36 year old son brings him a popsicle and a trash can every morning). He reports a continued sensation of something stuck in the back of his throat, increased cough but no significant sputum production. Family also reports that he fell down the stairs striking his back. The patient filled discharge medications and has been taking them since discharge. He denies IV drug use but states he has used pain tablet on a couple of occasions due to chest pain (family reports he is pretty candidate regarding his IV drug  abuse).    Initial ER evaluation found him to be mildly hypotensive.  CXR revealed a moderate right sided pneumothorax.    PCCM consulted for Pneumothorax.    SUBJECTIVE:  Feeling a bit better. Some mild SOB.   VITAL SIGNS: BP 111/73 (BP Location: Left Arm)   Pulse 69   Temp 97.9 F (36.6 C)   Resp 17   Wt 56.1 kg (123 lb 9.6 oz)   SpO2 100%   BMI 17.24 kg/m    INTAKE / OUTPUT: I/O last 3 completed shifts: In: 1730 [P.O.:480; IV Piggyback:1250] Out: 600 [Urine:600]  PHYSICAL EXAMINATION: General: thin adult male in NAD, eating breakfast.  HEENT: MM pink/moist, no jvd PSY: anxious Neuro: AAOx4, speech clear, MAE CV: s1s2 rrr, no m/r/g PULM: resps even/non-labored on RA, clear on L, bronchial breath sounds on  R, chest tube to 20cm suction, no sig airleak or drainage [id number], non-tender, bsx4 active  Extremities: warm/dry, no edema  Skin: no rashes or lesions  LABS:  BMET  Recent Labs Lab 09/22/16 1428 09/23/16 0313  NA 127* 133*  K 5.7* 6.1*  CL 98* 107  CO2 21* 18*  BUN 57* 54*  CREATININE 4.23* 3.72*  GLUCOSE 182* 113*    Electrolytes  Recent Labs Lab 09/22/16 1428 09/23/16 0313  CALCIUM 8.8* 8.2*  MG  --  1.8  PHOS  --  4.7*    CBC  Recent Labs Lab 09/22/16 1428 09/23/16 0313  WBC 12.1* 8.4  HGB 10.3* 8.6*  HCT 31.5* 25.8*  PLT 195 179    Coag's No results for input(s): APTT, INR in the last 168 hours.  Sepsis Markers  Recent  Labs Lab 09/22/16 1438 09/22/16 1604 09/23/16 0313  LATICACIDVEN 3.02* 2.31* 0.8    ABG No results for input(s): PHART, PCO2ART, PO2ART in the last 168 hours.  Liver Enzymes  Recent Labs Lab 09/22/16 1428  AST 30  ALT 25  ALKPHOS 176*  BILITOT 0.5  ALBUMIN 2.8*    Cardiac Enzymes No results for input(s): TROPONINI, PROBNP in the last 168 hours.  Glucose No results for input(s): GLUCAP in the last 168 hours.  Imaging Dg Chest 2 View  Result Date: 09/22/2016 CLINICAL DATA:   Shortness of breath. EXAM: CHEST  2 VIEW COMPARISON:  09/01/2016 FINDINGS: Normal heart size. No pleural effusion or edema. No airspace opacities. Advanced changes of emphysema identified. There is a moderate right-sided pneumothorax measuring 4 cm in thickness. The visualized osseous structures are unremarkable. IMPRESSION: Moderate volume right-sided pneumothorax Emphysema (ICD10-J43.9). Electronically Signed   By: Signa Kell M.D.   On: 09/22/2016 15:43   Ct Chest Wo Contrast  Result Date: 09/22/2016 CLINICAL DATA:  Recurrent pneumothorax. Patient has been dyspneic for several days accompanied by vague abdominal discomfort. Low-grade fevers 4 days ago and yesterday. Patient fell yesterday injuring lower back. EXAM: CT CHEST WITHOUT CONTRAST TECHNIQUE: Multidetector CT imaging of the chest was performed following the standard protocol without IV contrast. COMPARISON:  08/10/2016 FINDINGS: Cardiovascular: Heart is normal in size without pericardial effusion or thickening. Minimal LAD coronary arteriosclerosis. Mild dilatation of the un opacified main pulmonary artery consistent with chronic pulmonary hypertension up to 3.1 cm in caliber. 4.2 cm ascending thoracic aortic aneurysm. No aortic arch or descending thoracic aortic aneurysm. Mediastinum/Nodes: Limited by lack of IV contrast. No mediastinal adenopathy. The hila is limited in assessment due to the noncontrast study. The esophagus, trachea mainstem bronchi are unremarkable. Lungs/Pleura: Upper lobe predominant bullous, paraseptal and centrilobular emphysema is noted bilaterally with an approximately 30% right apical pneumothorax with chest tube in place. Pneumothorax is also seen along the right lung base with thinly septated paraseptal emphysematous changes along the periphery of the right middle and lower lobes. Partially opacified right upper lobe bulla measuring 2.2 x 1.8 x 1.7 cm is noted possibly residual from previous pneumonia. Suggest  attention on follow-up. No significant pleural effusion. Upper Abdomen: Mild stable splenomegaly. No adrenal masses. The unenhanced liver, included kidneys and gallbladder are nonacute. Musculoskeletal: No chest wall mass or suspicious bone lesions identified. Subcutaneous emphysema secondary to chest tube placement along the right hemithorax. IMPRESSION: 1. Severe upper lobe predominant centrilobular, paraseptal and bullous emphysematous disease of both lungs with right-sided pneumothorax estimated at 30%. A right-sided chest tube is seen within the pneumothorax. No tension noted. 2. 2.2 x 1.8 x 1.7 cm partially opacified right upper lobe bulla possibly stigmata of old prior pneumonic consolidation and residual debris within the bulla. A pulmonary mass is believed less likely. There has been marked clearing of previously noted pneumonic consolidations involving the right lung as well as clearing of bilateral pleural effusions. 3. Thoracic ascending aortic aneurysm at 4.2 cm in caliber. Recommend annual imaging followup by CTA or MRA. This recommendation follows 2010 ACCF/AHA/AATS/ACR/ASA/SCA/SCAI/SIR/STS/SVM Guidelines for the Diagnosis and Management of Patients with Thoracic Aortic Disease. Circulation. 2010; 121: W098-J191 4. Mild splenomegaly. Aortic Atherosclerosis (ICD10-I70.0) and Emphysema (ICD10-J43.9). Electronically Signed   By: Tollie Eth M.D.   On: 09/22/2016 20:57   Dg Chest Port 1 View  Result Date: 09/23/2016 CLINICAL DATA:  Pneumothorax EXAM: PORTABLE CHEST 1 VIEW COMPARISON:  09/22/2016 FINDINGS: Right chest tube remains in place. Improving  right pneumothorax. Possible tiny residual right apical pneumothorax and right lateral basilar pneumothorax. No confluent airspace opacities. Heart is normal size. Subcutaneous emphysema noted in the right chest wall, stable. IMPRESSION: Decreasing right pneumothorax with suspected tiny residual right pneumothorax. Electronically Signed   By: Charlett Nose  M.D.   On: 09/23/2016 07:44   Dg Chest Port 1 View  Result Date: 09/22/2016 CLINICAL DATA:  Evaluate right pneumothorax following right thoracostomy tube placement. EXAM: PORTABLE CHEST 1 VIEW COMPARISON:  09/22/2016 and prior radiographs FINDINGS: There has been interval placement of a right thoracostomy catheter. Right pneumothorax has decreased with small lateral basilar component remaining. The cardiomediastinal silhouette is unremarkable. The left lung is clear. Emphysema changes again identified. IMPRESSION: Right thoracostomy catheter placement with decrease in right pneumothorax, now small and primarily in the lateral basilar region. Emphysema (ICD10-J43.9). Electronically Signed   By: Harmon Pier M.D.   On: 09/22/2016 18:59     STUDIES:  CT Chest 9/16 >>1. Severe upper lobe predominant centrilobular, paraseptal and bullous emphysematous disease of both lungs with right-sided pneumothorax estimated at 30%. A right-sided chest tube is seen within the pneumothorax. No tension noted. 2. 2.2 x 1.8 x 1.7 cm partially opacified right upper lobe bulla possibly stigmata of old prior pneumonic consolidation and residual debris within the bulla. A pulmonary mass is believed less likely. There has been marked clearing of previously noted pneumonic consolidations involving the right lung as well as clearing of bilateral pleural effusions. 3. Thoracic ascending aortic aneurysm at 4.2 cm in caliber.  CULTURES: BCx2 9/16 >>   ANTIBIOTICS: Vanco 9/16 x1  Zosyn 9/16 x1   SIGNIFICANT EVENTS: 9/16  Admit with pneumothorax  LINES/TUBES: R Chest Tube 9/16 >>   DISCUSSION: 46 y/o M admitted with right sided pneumothorax   ASSESSMENT / PLAN:   Right Pneumothorax - ~30%, now s/p chest tube placement with significant improvement.  ? Chest Wall Defect -- Prior to chest tube placement, patient had silver dollar size rounded area near chest tube that would pop out on exhalation. NO obvious chest  wall defect noted on CT.  Pleuritic Chest Pain  Bullous Emphysematous Disease  P:   Continue chest tube - change to water seal  F/u CXR in am  O2 as needed to support sats > 90%   [facility] managing for medical issues.  PCCM will continue to follow for chest tube mgmt.   FAMILY  - Updates: no family available 9/17.    Dirk Dress, NP 09/23/2016  9:32 AM Pager: [phone] or [phone]  Attending Note:  46 year old male with IVDA and severe bullous disease on chest CT that I saw myself, PTX noted and improving on CXR.  On exam, decreased BS diffusely.  Discussed with PCCM-NP.  Pneumothorax:  - Chest tube to water seal  - CXR in AM.  Empyema:  - Abx course complete  - Follow clinically  Bullous disease: due to septic emboli  - Chest CT if failure to improve  Chest wall defect:  - May need CVTS to evaluate  PCCM will continue to follow  Patient seen and examined, agree with above note.  I dictated the care and orders written for this patient under my direction.  Alyson Reedy, MD [phone]

## 2016-09-23 NOTE — Progress Notes (Signed)
eLink Physician-Brief Progress Note Patient Name: Johnathan [name] DOB: [birth year] MRN: [id number]   Date of Service  09/23/2016  HPI/Events of Note  Discussed procedure for flushing percutaneous chest tube using 3-way stopcock with bedside nurse. Patient complaining of significant pain despite Ultram. Only listed allergy is to Ativan. Patient could be heard over the phone complaining of pain.   eICU Interventions  1. Continuing Ultram 2. Lortab 5/325 ordered as needed every 6 hours for severe pain  3. Discussed procedure for flushing of percutaneous chest tube with bedside nurse      Intervention Category Major Interventions: Other:  [name] 09/23/2016, 1:36 AM

## 2016-09-23 NOTE — Progress Notes (Signed)
Pt refused to take the second dose of Kayexalate. MD Arrien made aware.

## 2016-09-24 DIAGNOSIS — K7469 Other cirrhosis of liver: Secondary | ICD-10-CM

## 2016-09-24 LAB — CBC WITH DIFFERENTIAL/PLATELET
BASOS ABS: 0 10*3/uL (ref 0.0–0.1)
Basophils Relative: 1 %
EOS PCT: 2 %
Eosinophils Absolute: 0.1 10*3/uL (ref 0.0–0.7)
HEMATOCRIT: 24.6 % — AB (ref 39.0–52.0)
Hemoglobin: 8 g/dL — ABNORMAL LOW (ref 13.0–17.0)
LYMPHS PCT: 42 %
Lymphs Abs: 2.9 10*3/uL (ref 0.7–4.0)
MCH: 27.9 pg (ref 26.0–34.0)
MCHC: 32.5 g/dL (ref 30.0–36.0)
MCV: 85.7 fL (ref 78.0–100.0)
Monocytes Absolute: 0.4 10*3/uL (ref 0.1–1.0)
Monocytes Relative: 6 %
NEUTROS ABS: 3.3 10*3/uL (ref 1.7–7.7)
Neutrophils Relative %: 49 %
PLATELETS: 199 10*3/uL (ref 150–400)
RBC: 2.87 MIL/uL — AB (ref 4.22–5.81)
RDW: 15.4 % (ref 11.5–15.5)
WBC: 6.8 10*3/uL (ref 4.0–10.5)

## 2016-09-24 LAB — BASIC METABOLIC PANEL
Anion gap: 3 — ABNORMAL LOW (ref 5–15)
BUN: 44 mg/dL — ABNORMAL HIGH (ref 6–20)
CO2: 23 mmol/L (ref 22–32)
Calcium: 8 mg/dL — ABNORMAL LOW (ref 8.9–10.3)
Chloride: 107 mmol/L (ref 101–111)
Creatinine, Ser: 2.95 mg/dL — ABNORMAL HIGH (ref 0.61–1.24)
GFR, EST AFRICAN AMERICAN: 28 mL/min — AB (ref >60)
GFR, EST NON AFRICAN AMERICAN: 24 mL/min — AB (ref >60)
Glucose, Bld: 117 mg/dL — ABNORMAL HIGH (ref 65–99)
POTASSIUM: 5.1 mmol/L (ref 3.5–5.1)
SODIUM: 133 mmol/L — AB (ref 135–145)

## 2016-09-24 MED ORDER — FAMOTIDINE 20 MG PO TABS
40.0000 mg | ORAL_TABLET | Freq: Every day | ORAL | Status: DC
  Administered 2016-09-25: 40 mg via ORAL
  Filled 2016-09-24 (×5): qty 2

## 2016-09-24 MED ORDER — PROSIGHT PO TABS
1.0000 | ORAL_TABLET | Freq: Every day | ORAL | Status: DC
  Administered 2016-09-25: 1 via ORAL
  Filled 2016-09-24 (×6): qty 1

## 2016-09-24 NOTE — Progress Notes (Signed)
Patient refusing medications and care throughout day. Stated "Fuck it, I just want to die!" Refused IVF to be re hung took his own tele off. Will not ambulate even to the bathroom, using urinal. Wants to leave. Dr. Ella Jubilee aware and has spoken to patient.

## 2016-09-24 NOTE — Progress Notes (Signed)
PROGRESS NOTE    Johnathan [name]  WUJ:[id number] DOB: [birth year] DOA: 09/22/2016 PCP: Karle Plumber, MD    Brief Narrative:   46 year old male who presented to hospital dyspnea and right-sided chest pain. 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 Was diagnosed with hepatitis C/B and acute left upper extremity deep vein thrombosis. He complained of persistent right-sided chest pain, poor oral intake, worsening weakness and deconditioning. On initial physical examination his blood pressure was 90/77, heart rate 97, temperature 98.3, respiratory 20, oxygen saturation 100%. Moist mucous membranes, lungs had bronchial breath sounds, decreased breath sounds on right, heart S1-S2 present rhythmic, no gallops or murmurs, abdomen soft nontender, no lower extremity edema. His chest x-ray showed right pneumothorax. EKG sinus rhythm with right bundle branch block.   Patient was admitted to the intensive care unit with the working diagnosis of acute spontaneous pneumothorax.   Assessment & Plan:   Active Problems:   Pneumothorax  1. Right pneumothorax, suspected spontaneous. Chest tube has been placed to suction, will continue oxymetry monitoring and supplemental oxygen per nasal cannula. 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 Will continue pain control, will order physical therapy, out of bed as tolerated for ambulation and a nutritional consult.   2. Acute kidney injury on stage 3 CKD, due to dehydration, complicated with hyperkalemia. Renal function with serum cr down to 2,95 with K 5,1, will continue hydration with saline, decrease rate down to 75 ml per hours and will follow on renal panel in am.   3. Hyperglycemia. Capillary glucose 103, 106, 115, 110, 131, tolerating po, will continue capillary  glucose monitoring.   4. History of IV drug abuse. Continue neuro checks per unit protocol. No clinical signs of withdrawal.   5. Hepatic cirrhosis, due to viral hepatitis. C and B. Stable, no ascites or encephalopathy, will continue close monitoring.     DVT prophylaxis: enoxaparin  Code Status: full Family Communication: No family at the bedside Disposition Plan: Home   Consultants:     Procedures:     Antimicrobials:      Subjective: Patient with persistent pain on the right chest, worse with movement, improved with analgesics, no radiation or associated symptoms.   Objective: Vitals:   09/23/16 1617 09/23/16 2220 09/24/16 0500 09/24/16 0536  BP: 111/80 117/72  112/81  Pulse: 74 76  62  Resp: Temp: (!) 97.5 F (36.4 C) 98.5 F (36.9 C)  98.2 F (36.8 C)  TempSrc: Oral Oral  Oral  SpO2: 100% 100%  99%  Weight:   59.3 kg (130 lb 12.8 oz)     Intake/Output Summary (Last 24 hours) at 09/24/16 1012 Last data filed at 09/24/16 0900  Gross per 24 hour  Intake          2528.33 ml  Output             2250 ml  Net           278.33 ml   Filed Weights   09/22/16 1538 09/24/16 0500  Weight: 56.1 kg (123 lb 9.6 oz) 59.3 kg (130 lb 12.8 oz)    Examination:  General: deconditioned Neurology: Awake and alert, non focal  E ENT: mild pallor, no icterus, oral mucosa moist Cardiovascular: No JVD. S1-S2 present, rhythmic, no gallops, rubs, or murmurs. No lower extremity edema.  Pulmonary: vesicular breath sounds on the left and right, with decreased at the right base, adequate air movement, no wheezing, rhonchi or rales. Gastrointestinal. Abdomen flat, no organomegaly, non tender, no rebound or guarding Skin. No rashes Musculoskeletal: no joint deformities     Data Reviewed: I have personally reviewed following labs and imaging studies  CBC:  Recent Labs Lab 09/22/16 1428 09/23/16 0313 09/24/16 0421  WBC 12.1* 8.4 6.8  NEUTROABS  --    --  3.3  HGB 10.3* 8.6* 8.0*  HCT 31.5* 25.8* 24.6*  MCV 84.7 83.8 85.7  PLT 195 179 199   Basic Metabolic Panel:  Recent Labs Lab 09/22/16 1428 09/23/16 0313 09/23/16 1618 09/24/16 0421  NA 127* 133* 133* 133*  K 5.7* 6.1* 6.1* 5.1  CL 98* 107 106 107  CO2 21* 18* 20* 23  GLUCOSE 182* 113* 111* 117*  BUN 57* 54* 49* 44*  CREATININE 4.23* 3.72* 3.48* 2.95*  CALCIUM 8.8* 8.2* 8.2* 8.0*  MG  --  1.8  --   --   PHOS  --  4.7*  --   --    GFR: Estimated Creatinine Clearance: 26.5 mL/min (A) (by C-G formula based on SCr of 2.95 mg/dL (H)). Liver Function Tests:  Recent Labs Lab 09/22/16 1428  AST 30  ALT 25  ALKPHOS 176*  BILITOT 0.5  PROT 7.5  ALBUMIN 2.8*   No results for input(s): LIPASE, AMYLASE in the last 168 hours. No results for input(s): AMMONIA in the last 168 hours. Coagulation Profile: No results for input(s): INR, PROTIME in the last 168 hours. Cardiac Enzymes: No results for input(s): CKTOTAL, CKMB, CKMBINDEX, TROPONINI in the last 168 hours. BNP (last 3 results) No results for input(s): PROBNP in the last 8760 hours. HbA1C: No results for input(s): HGBA1C in the last 72 hours. CBG: No results for input(s): GLUCAP in the last 168 hours. Lipid Profile: No results for input(s): CHOL, HDL, LDLCALC, TRIG, CHOLHDL, LDLDIRECT in the last 72 hours. Thyroid Function Tests: No results for input(s): TSH, T4TOTAL, FREET4, T3FREE, THYROIDAB in the last 72 hours. Anemia Panel: No results for input(s): VITAMINB12, FOLATE, FERRITIN, TIBC, IRON, RETICCTPCT in the last 72 hours.    Radiology Studies: I have reviewed all of the imaging during this hospital visit personally     Scheduled Meds: . fentaNYL (SUBLIMAZE) injection  100 mcg Intravenous Once  . heparin  5,000 Units Subcutaneous Q8H   Continuous Infusions: . sodium chloride    . sodium chloride 100 mL/hr at 09/24/16 0600     LOS: 2 days        Oakley Kossman Annett Gula, MD [facility] Pager [phone]

## 2016-09-24 NOTE — Progress Notes (Signed)
PULMONARY / CRITICAL CARE MEDICINE   Name: Johnathan [name] MRN: [id number] DOB: 1970-10-16    ADMISSION DATE:  09/22/2016 CONSULTATION DATE:  09/22/16  REFERRING MD:  Dr. Ethelda Chick   CHIEF COMPLAINT:  SOB, right sided chest pain  HISTORY OF PRESENT ILLNESS:   46 year old male who presented to [facility] ER on 9/16 with complaints of ongoing shortness of breath and right-sided chest pain.  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  There were initial concerns for potential meningitis however CSF cultures were negative. Repeat blood cultures prior to discharge were negative. Patient was identified to be hepatitis C and hepatitis B positive (felt not to be a candidate for treatment due to drug abuse). Hospital course was further complicated by left upper extremity radial vein DVT (PE negative, decision made not to use systemic anticoagulation given high risk for bleeding/prolonged chest tube), AKI, metabolic encephalopathy, protein calorie malnutrition and anemia.  The patient's family report that he has not felt well since discharge. He has had persistent shortness of breath and right-sided chest pain. The patient has had poor PO intake and almost daily nausea and vomiting (to the point that his 37 year old son brings him a popsicle and a trash can every morning). He reports a continued sensation of something stuck in the back of his throat, increased cough but no significant sputum production. Family also reports that he fell down the stairs striking his back. The patient filled discharge medications and has been taking them since discharge. He denies IV drug use but states he has used pain tablet on a couple of occasions due to chest pain (family reports he is pretty candidate regarding his IV drug  abuse).    Initial ER evaluation found him to be mildly hypotensive.  CXR revealed a moderate right sided pneumothorax.    PCCM consulted for Pneumothorax.    SUBJECTIVE:  States he feels like he did yesterday 9/17. Mild shortness of breath.Minimal drainage overnight.  VITAL SIGNS: BP 112/81 (BP Location: Right Arm)   Pulse 62   Temp 98.2 F (36.8 C) (Oral)   Resp 16   Wt 130 lb 12.8 oz (59.3 kg)   SpO2 99%   BMI 18.24 kg/m    INTAKE / OUTPUT: I/O last 3 completed shifts: In: 3128.3 [P.O.:1180; I.V.:1948.3] Out: 2450 [Urine:2450]  PHYSICAL EXAMINATION: General: Adult male sitting in bed eating breakfast HEENT:MM pink and moist, normocephalic. atraumatic PSY: flat affect Neuro: Cranial nerves intact, alert and appropriate CV: s1s2 rrr, no m/r/g PULM: resps even/non-labored on RA, clear on L, bronchial breath sounds on  R, chest tube to water seal, no sig airleak , minimal drainage AY:TKZS, non-tender, bsx4 active  Extremities: warm/dry, no edema  Skin: warm and dry, no rashes or lesions, multiple tattoos  LABS:  BMET  Recent Labs Lab 09/23/16 0313 09/23/16 1618 09/24/16 0421  NA 133* 133* 133*  K 6.1* 6.1* 5.1  CL 107 106 107  CO2 18* 20* 23  BUN 54* 49* 44*  CREATININE 3.72* 3.48* 2.95*  GLUCOSE 113* 111* 117*    Electrolytes  Recent Labs Lab 09/23/16 0313 09/23/16 1618 09/24/16 0421  CALCIUM 8.2* 8.2* 8.0*  MG 1.8  --   --   PHOS 4.7*  --   --     CBC  Recent Labs Lab 09/22/16 1428 09/23/16 0313 09/24/16 0421  WBC 12.1* 8.4 6.8  HGB 10.3* 8.6* 8.0*  HCT 31.5* 25.8* 24.6*  PLT 195 179 199    Coag's No results for input(s): APTT, INR in the last 168 hours.  Sepsis Markers  Recent Labs Lab 09/22/16 1438 09/22/16 1604 09/23/16 0313  LATICACIDVEN 3.02* 2.31* 0.8    ABG No results for input(s): PHART, PCO2ART, PO2ART in the last 168 hours.  Liver Enzymes  Recent Labs Lab 09/22/16 1428  AST 30  ALT 25  ALKPHOS 176*   BILITOT 0.5  ALBUMIN 2.8*    Cardiac Enzymes No results for input(s): TROPONINI, PROBNP in the last 168 hours.  Glucose No results for input(s): GLUCAP in the last 168 hours.  Imaging Dg Chest Portable 1 View  Result Date: 09/23/2016 CLINICAL DATA:  Pneumothorax. EXAM: PORTABLE CHEST 1 VIEW COMPARISON:  Radiograph of September 23, 2016. FINDINGS: The heart size and mediastinal contours are within normal limits. Emphysematous bulla formation is noted in the left upper lobe. Minimal left basilar scarring or subsegmental atelectasis is noted. Right-sided chest tube is unchanged in position. Mild right apical pneumothorax is noted which is slightly enlarged compared to prior exam. Stable subcutaneous emphysema is seen over right lateral chest wall. The visualized skeletal structures are unremarkable. IMPRESSION: Emphysematous bulla formation is noted in left upper lobe. Right-sided chest tube is unchanged in position. Mild right apical pneumothorax is noted which is slightly enlarged compared to prior exam. Electronically Signed   By: Lupita Raider, M.D.   On: 09/23/2016 11:47     STUDIES:  CT Chest 9/16 >>1. Severe upper lobe predominant centrilobular, paraseptal and bullous emphysematous disease of both lungs with right-sided pneumothorax estimated at 30%. A right-sided chest tube is seen within the pneumothorax. No tension noted. 2. 2.2 x 1.8 x 1.7 cm partially opacified right upper lobe bulla possibly stigmata of old prior pneumonic consolidation and residual debris within the bulla. A pulmonary mass is believed less likely. There has been marked clearing of previously noted pneumonic consolidations involving the right lung as well as clearing of bilateral pleural effusions. 3. Thoracic ascending aortic aneurysm at 4.2 cm in caliber.  CULTURES: BCx2 9/16 >>   ANTIBIOTICS: Vanco 9/16 x1  Zosyn 9/16 x1   SIGNIFICANT EVENTS: 9/16  Admit with pneumothorax  LINES/TUBES: R  Chest Tube 9/16 >>   DISCUSSION: 46 y/o M admitted with right sided pneumothorax   ASSESSMENT / PLAN:   Right Pneumothorax - ~30%, now s/p chest tube placement with significant improvement.  ? Chest Wall Defect -- Prior to chest tube placement, patient had silver dollar size rounded area near chest tube that would pop out on exhalation. NO obvious chest wall defect noted on CT.  Pleuritic Chest Pain  Bullous Emphysematous Disease  9/18 CXR>>Emphysematous bulla formation  in left upper lobe. Right-sided chest tube is unchanged in position. Mild right apical pneumothorax is noted which is slightly enlarged compared to prior exam P:   Continue chest tube - resume suction 20 cm H2O  F/u CXR 9/18 pm and  9/19  O2 as needed to support sats > 90% Empyema>> Follow Clinically  Chest Wall defect>> Consider CVTS evaluation  [facility] for medical issues.  PCCM will continue to follow for chest tube mgmt.   FAMILY  - Updates: no family available 9/18. Pt. Updated at bedside.   Bevelyn Ngo, AGACNP-BC 09/24/2016  8:38 AM Pager:  [phone]  Attending Note:  46 year old male with IVDA and severe bullous disease  on CT presenting with a  PTX.  I reviewed CXR myself, worsening PTX noted.  On exam, decreased BS throughout the lungs.  Discussed with PCCM-NP.  PTX:  - Chest tube back to suction  - Recommend CVTS consultation  - CXR in AM  Empyema:   - Course of abx complete  - Follow fever curve and WBC  Bullous disease of the lungs: likely due to previous septic emboli from endocarditis  - CT of the chest as f/u as needed  Chest wall defect from previous empyema and CT  - CVTS to address if becomes a more active issues.  PCCM will follow.  Patient seen and examined, agree with above note.  I dictated the care and orders written for this patient under my direction.  Alyson Reedy, MD [phone]

## 2016-09-25 ENCOUNTER — Inpatient Hospital Stay (HOSPITAL_COMMUNITY): Payer: Medicaid Other

## 2016-09-25 LAB — BASIC METABOLIC PANEL
Anion gap: 9 (ref 5–15)
BUN: 36 mg/dL — AB (ref 6–20)
CO2: 17 mmol/L — ABNORMAL LOW (ref 22–32)
CREATININE: 2.72 mg/dL — AB (ref 0.61–1.24)
Calcium: 8.1 mg/dL — ABNORMAL LOW (ref 8.9–10.3)
Chloride: 106 mmol/L (ref 101–111)
GFR, EST AFRICAN AMERICAN: 31 mL/min — AB (ref >60)
GFR, EST NON AFRICAN AMERICAN: 27 mL/min — AB (ref >60)
Glucose, Bld: 100 mg/dL — ABNORMAL HIGH (ref 65–99)
POTASSIUM: 5.5 mmol/L — AB (ref 3.5–5.1)
SODIUM: 132 mmol/L — AB (ref 135–145)

## 2016-09-25 MED ORDER — INFLUENZA VAC SPLIT QUAD 0.5 ML IM SUSY
0.5000 mL | PREFILLED_SYRINGE | INTRAMUSCULAR | Status: DC
  Filled 2016-09-25 (×2): qty 0.5

## 2016-09-25 MED ORDER — SODIUM POLYSTYRENE SULFONATE 15 GM/60ML PO SUSP
30.0000 g | ORAL | Status: AC
  Filled 2016-09-25 (×2): qty 120

## 2016-09-25 NOTE — Progress Notes (Signed)
PT Cancellation Note  Patient Details Name: Johnathan [name] MRN: [id number] DOB: [birth year]   Cancelled Treatment:    Reason Eval/Treat Not Completed: Other (comment).  Pt reports he is fine to get around and stated he would be up to the chair later.  Reminded him not to attempt to stand alone, to ask nursing to assist him.   [name] 09/25/2016, 2:31 PM   Samul Dada, PT MS Acute Rehab Dept. Number: [facility] [id number] and [facility] [phone]

## 2016-09-25 NOTE — Progress Notes (Signed)
PT Cancellation Note  Patient Details Name: Johnathan [name] MRN: [id number] DOB: [birth year]   Cancelled Treatment:    Reason Eval/Treat Not Completed: Patient declined, no reason specified.  Reports he doesn't feel like doing anything right now and that he is getting around fine.  Asked if PT could ck later and he agreed.  Will reattempt eval when pt is permitting therapist to work with him.   [name] 09/25/2016, 11:21 AM   Samul Dada, PT MS Acute Rehab Dept. Number: [facility] [id number] and [facility] [phone]

## 2016-09-25 NOTE — Progress Notes (Signed)
Initial Nutrition Assessment  DOCUMENTATION CODES:   Severe malnutrition in context of chronic illness  INTERVENTION:    Continue Regular diet   NUTRITION DIAGNOSIS:   Malnutrition (severe) related to  (bullous lung disease, hepatic cirrhosis ) as evidenced by severe depletion of muscle mass, severe depletion of body fat, 15% weight loss x 2 months  GOAL:   Patient will meet greater than or equal to 90% of their needs  MONITOR:   PO intake, Supplement acceptance, Labs, Weight trends, Skin, I & O's  REASON FOR ASSESSMENT:   Consult Assessment of nutrition requirement/status  ASSESSMENT:   46 yo Male who presented to hospital dyspnea and right-sided chest pain. 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  Pt frustrated upon visit. Wants to go home.  PO intake variable at 10-100% per flowsheet records. PTA was experiencing poor PO intake, nausea and vomiting.  States he's lost 70 lbs since his initial hospitalization beginning in July 2018. Per readings below, pt has had a 15% weight loss since 07/2016. Severe for time frame. Declined all nutrition supplements RD offered stating "No, they all taste like shit".  Medications reviewed and include MVI (Prosight). Labs reviewed. Na 132 (L). K 5.5 (H). CBG's [id number].  Nutrition-Focused physical exam completed. Findings are severe fat depletion, severe muscle depletion, and no edema.   Diet Order:  Diet regular Room service appropriate? Yes; Fluid consistency: Thin  Skin:  Reviewed, no issues   Intake/Output Summary (Last 24 hours) at 09/25/16 1509 Last data filed at 09/25/16 1349  Gross per 24 hour  Intake              998 ml  Output             2140 ml  Net            -1142 ml   Last BM:  9/18  Height:   Ht Readings from Last 1 Encounters:   09/25/16  (1.803 m)   Weight:   Wt Readings from Last 1 Encounters:  09/25/16 133 lb 3.2 oz (60.4 kg)  07/22/16         156 lb (70.8 kg)  Ideal Body Weight:  78.2 kg  BMI:  Body mass index is 18.58 kg/m.  Estimated Nutritional Needs:   Kcal:  2100-2300  Protein:  90-110 gm  Fluid:  2.1-2.3 L  EDUCATION NEEDS:   No education needs identified at this time  Maureen Chatters, RD, LDN Pager #: [phone] After-Hours Pager #: [phone]

## 2016-09-25 NOTE — Progress Notes (Signed)
Patient refused kayex twice. Said he had a BM I told him not sure if one BM would be enough to bring his K down. Explained the importance of bringing K down. Still refusing. Did get OOB to chair today.

## 2016-09-25 NOTE — Progress Notes (Signed)
PULMONARY / CRITICAL CARE MEDICINE   Name: Johnathan [name] MRN: [id number] DOB: 22-Oct-1970    ADMISSION DATE:  09/22/2016 CONSULTATION DATE:  09/22/16  REFERRING MD:  Dr. Ethelda Chick   CHIEF COMPLAINT:  SOB, right sided chest pain  HISTORY OF PRESENT ILLNESS:   46 year old male who presented to [facility] ER on 9/16 with complaints of ongoing shortness of breath and right-sided chest pain.  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  There were initial concerns for potential meningitis however CSF cultures were negative. Repeat blood cultures prior to discharge were negative. Patient was identified to be hepatitis C and hepatitis B positive (felt not to be a candidate for treatment due to drug abuse). Hospital course was further complicated by left upper extremity radial vein DVT (PE negative, decision made not to use systemic anticoagulation given high risk for bleeding/prolonged chest tube), AKI, metabolic encephalopathy, protein calorie malnutrition and anemia.  The patient's family report that he has not felt well since discharge. He has had persistent shortness of breath and right-sided chest pain. The patient has had poor PO intake and almost daily nausea and vomiting (to the point that his 32 year old son brings him a popsicle and a trash can every morning). He reports a continued sensation of something stuck in the back of his throat, increased cough but no significant sputum production. Family also reports that he fell down the stairs striking his back. The patient filled discharge medications and has been taking them since discharge. He denies IV drug use but states he has used pain tablet on a couple of occasions due to chest pain (family reports he is pretty candidate regarding his IV drug  abuse).    Initial ER evaluation found him to be mildly hypotensive.  CXR revealed a moderate right sided pneumothorax.    PCCM consulted for Pneumothorax.    SUBJECTIVE:  Feels ok.  Mild SOB.  Wants to go home.  Very tired of being in the hospital.    VITAL SIGNS: BP 114/75 (BP Location: Left Arm)   Pulse 74   Temp 98.2 F (36.8 C) (Oral)   Resp 18   Wt 60.4 kg (133 lb 3.2 oz)   SpO2 100%   BMI 18.58 kg/m    INTAKE / OUTPUT: I/O last 3 completed shifts: In: 3286.3 [P.O.:700; I.V.:2586.3] Out: 2650 [Urine:2650]  PHYSICAL EXAMINATION: General: Adult male sitting in bed eating breakfast HEENT: MM pink and moist, normocephalic. atraumatic Neuro: awake,  alert and appropriate CV: s1s2 rrr, no m/r/g PULM: resps even/non-labored on RA, essentially clear, no palpable subq air, R chest tube to suction, no air leak  WU:JWJX, non-tender, bsx4 active  Extremities: warm/dry, no edema  Skin: warm and dry, no rashes or lesions, multiple tattoos  LABS:  BMET  Recent Labs Lab 09/23/16 0313 09/23/16 1618 09/24/16 0421  NA 133* 133* 133*  K 6.1* 6.1* 5.1  CL 107 106 107  CO2 18* 20* 23  BUN 54* 49* 44*  CREATININE 3.72* 3.48* 2.95*  GLUCOSE 113* 111* 117*    Electrolytes  Recent Labs Lab 09/23/16 0313 09/23/16 1618 09/24/16 0421  CALCIUM 8.2* 8.2* 8.0*  MG 1.8  --   --   PHOS 4.7*  --   --     CBC  Recent Labs Lab 09/22/16 1428 09/23/16 0313 09/24/16 0421  WBC 12.1*  8.4 6.8  HGB 10.3* 8.6* 8.0*  HCT 31.5* 25.8* 24.6*  PLT 195 179 199    Coag's No results for input(s): APTT, INR in the last 168 hours.  Sepsis Markers  Recent Labs Lab 09/22/16 1438 09/22/16 1604 09/23/16 0313  LATICACIDVEN 3.02* 2.31* 0.8    ABG No results for input(s): PHART, PCO2ART, PO2ART in the last 168 hours.  Liver Enzymes  Recent Labs Lab 09/22/16 1428  AST 30  ALT 25  ALKPHOS 176*  BILITOT 0.5  ALBUMIN 2.8*    Cardiac Enzymes No results for input(s):  TROPONINI, PROBNP in the last 168 hours.  Glucose No results for input(s): GLUCAP in the last 168 hours.  Imaging Dg Chest Port 1 View  Result Date: 09/25/2016 CLINICAL DATA:  Respiratory failure.  Shortness of breath. EXAM: PORTABLE CHEST 1 VIEW COMPARISON:  09/23/2016 FINDINGS: Shallow inspiration. Heart size and pulmonary vascularity are normal. Emphysematous changes in the upper lungs. Right chest tube remains unchanged in position. Tiny residual right pneumothorax is smaller than on previous study. Small amount of subcutaneous emphysema in the right chest wall. Right rib fractures. Left lung is clear. No blunting of costophrenic angles. IMPRESSION: Minimal residual right pneumothorax with right chest tube in place. Subcutaneous emphysema in the right chest wall. Right rib fractures. Emphysematous changes in the lungs. Electronically Signed   By: Burman Nieves M.D.   On: 09/25/2016 02:13     STUDIES:  CT Chest 9/16 >>1. Severe upper lobe predominant centrilobular, paraseptal and bullous emphysematous disease of both lungs with right-sided pneumothorax estimated at 30%. A right-sided chest tube is seen within the pneumothorax. No tension noted. 2. 2.2 x 1.8 x 1.7 cm partially opacified right upper lobe bulla possibly stigmata of old prior pneumonic consolidation and residual debris within the bulla. A pulmonary mass is believed less likely. There has been marked clearing of previously noted pneumonic consolidations involving the right lung as well as clearing of bilateral pleural effusions. 3. Thoracic ascending aortic aneurysm at 4.2 cm in caliber.  CULTURES: BCx2 9/16 >>   ANTIBIOTICS: Vanco 9/16 x1  Zosyn 9/16 x1   SIGNIFICANT EVENTS: 9/16  Admit with pneumothorax  LINES/TUBES: R Chest Tube 9/16 >>   DISCUSSION: 46 y/o M admitted with right sided pneumothorax   ASSESSMENT / PLAN:   Right Pneumothorax - ~30%, now s/p chest tube placement with significant  improvement.   Had small degree of re-collapse when CT taken off suction 9/17 ? Chest Wall Defect -- Prior to chest tube placement, patient had silver dollar size rounded area near chest tube that would pop out on exhalation. NO obvious chest wall defect noted on CT.  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 Subcutaneous emphysema in the right chest wall. Right rib fractures. Emphysematous changes in the lungs. P:   Chest tube to suction water seal F/u CXR in am  O2 as needed to support sats > 90% Empyema>> Follow Clinically  Recommend CVTS eval   [facility] for medical issues.  PCCM will continue to follow for chest tube mgmt.   FAMILY  - Updates: pt updated 9/19  Dirk Dress, NP 09/25/2016  10:18 AM Pager: [phone] or [phone]  Attending Note:  46 year old male with IVDA, septic emboli and severe pulmonary bullous disease on CT.  On exam, distant BS bilaterally.  I reviewed  CXR myself, minimal residual PTX on the right with chest tube in place.  Discussed with PCCM-NP.  PTX:  - Chest tube to water seal  - CXR in AM  - If PTX worsens in AM then WILL need CVTS involvement  Empyema:  - Completed course of abx  - Follow fever curve and WBC  Bullous lung disease:  - May need pleurodesis if more PTX  PCCM will follow.  Patient seen and examined, agree with above note.  I dictated the care and orders written for this patient under my direction.  Alyson Reedy, MD [phone]

## 2016-09-25 NOTE — Progress Notes (Signed)
PROGRESS NOTE    Johnathan [name]  ZOX:[id number] DOB: December 11, 1970 DOA: 09/22/2016 PCP: Karle Plumber, MD    Brief Narrative:   46 year old male who presented to hospital dyspnea and right-sided chest pain. 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 Was diagnosed with hepatitis C/B and acute left upper extremity deep vein thrombosis. He complained of persistent right-sided chest pain, poor oral intake, worsening weakness and deconditioning. On initial physical examination his blood pressure was 90/77, heart rate 97, temperature 98.3, respiratory 20, oxygen saturation 100%. Moist mucous membranes, lungs had bronchial breath sounds, decreased breath sounds on right, heart S1-S2 present rhythmic, no gallops or murmurs, abdomen soft nontender, no lower extremity edema. His chest x-ray showed right pneumothorax. EKG sinus rhythm with right bundle branch block.   Patient was admitted to the intensive care unit with the working diagnosis of acute spontaneous pneumothorax.   Assessment & Plan:   Active Problems:   Pneumothorax   1. Right pneumothorax, suspected spontaneous, emphysematous bullous disease. Am chest film personally reviewed improved right lung inflation compared from piror film, chest tube to water seal, plan to follow on chest film in am, if recurrence or worsening will need cardiothoracic consultation, will continue oxymetry monitoring and supplemental oxygen per nasal cannula. Continue pain control, (hydrocodoen and tramadol), out of bed as tolerated, ambulation and physical therapy evaluation. Follow with nutrition evaluation.   2. Acute kidney injury on stage 3 CKD, due to dehydration, complicated with hyperkalemia. Will continue saline IV at 75 per hour, follow renal function this am, continue to avoid  nephrotoxic medications or hypotension, follow on renal panel in am.   3. Hyperglycemia. Controlled, with capillary glucose 103, 106, 115, 110, 131. Will discontinue further capillary glucose monitoring, will follow bmp in am. Patient with poor oral intake.   4. History of IV drug abuse. No clinical signs of withdrawal. Continue nero checks per unit protocol.   5. Hepatic cirrhosis, due to viral hepatitis. C and B. No ascites or encephalopathy, continue to hold on diureitic therapy. Continue famotidine for gi prophylaxis.     DVT prophylaxis:enoxaparin Code Status:full Family Communication:No family at the bedside Disposition Plan:Home   Consultants:    Procedures:    Antimicrobials:  Subjective: Patient continue to complain of chest wall pain on the right, moderate to severe in intensity worse with movement and deep inspiration, improved with analgesics, no associated dyspnea, or cough.  Objective: Vitals:   09/24/16 0536 09/24/16 1345 09/24/16 2101 09/25/16 0546  BP: 112/81 114/70 114/75 114/75  Pulse: 62 65 74 74  Resp: Temp: 98.2 F (36.8 C) 98.2 F (36.8 C) 98.6 F (37 C) 98.2 F (36.8 C)  TempSrc: Oral Oral Oral Oral  SpO2: 99% 100% 100% 100%  Weight:    60.4 kg (133 lb 3.2 oz)    Intake/Output Summary (Last 24 hours) at 09/25/16 0854 Last data filed at 09/25/16 0618  Gross per 24 hour  Intake             1118 ml  Output             1200 ml  Net              -82 ml   Filed Weights   09/22/16 1538 09/24/16 0500 09/25/16 0546  Weight: 56.1 kg (123 lb 9.6 oz) 59.3 kg (  130 lb 12.8 oz) 60.4 kg (133 lb 3.2 oz)    Examination:  General: deconditioned Neurology: Awake and alert, non focal  E ENT: mild pallor, no icterus, oral mucosa moist Cardiovascular: No JVD. S1-S2 present, rhythmic, no gallops, rubs, or murmurs. No lower extremity edema. Pulmonary: vesicular breath sounds bilaterally, adequate air movement, no wheezing,  rhonchi or rales. Gastrointestinal. Abdomen flat, no organomegaly, non tender, no rebound or guarding Skin. No rashes/ mild sq emphysema on the right shoulder.  Musculoskeletal: no joint deformities     Data Reviewed: I have personally reviewed following labs and imaging studies  CBC:  Recent Labs Lab 09/22/16 1428 09/23/16 0313 09/24/16 0421  WBC 12.1* 8.4 6.8  NEUTROABS  --   --  3.3  HGB 10.3* 8.6* 8.0*  HCT 31.5* 25.8* 24.6*  MCV 84.7 83.8 85.7  PLT 195 179 199   Basic Metabolic Panel:  Recent Labs Lab 09/22/16 1428 09/23/16 0313 09/23/16 1618 09/24/16 0421  NA 127* 133* 133* 133*  K 5.7* 6.1* 6.1* 5.1  CL 98* 107 106 107  CO2 21* 18* 20* 23  GLUCOSE 182* 113* 111* 117*  BUN 57* 54* 49* 44*  CREATININE 4.23* 3.72* 3.48* 2.95*  CALCIUM 8.8* 8.2* 8.2* 8.0*  MG  --  1.8  --   --   PHOS  --  4.7*  --   --    GFR: Estimated Creatinine Clearance: 27 mL/min (A) (by C-G formula based on SCr of 2.95 mg/dL (H)). Liver Function Tests:  Recent Labs Lab 09/22/16 1428  AST 30  ALT 25  ALKPHOS 176*  BILITOT 0.5  PROT 7.5  ALBUMIN 2.8*   No results for input(s): LIPASE, AMYLASE in the last 168 hours. No results for input(s): AMMONIA in the last 168 hours. Coagulation Profile: No results for input(s): INR, PROTIME in the last 168 hours. Cardiac Enzymes: No results for input(s): CKTOTAL, CKMB, CKMBINDEX, TROPONINI in the last 168 hours. BNP (last 3 results) No results for input(s): PROBNP in the last 8760 hours. HbA1C: No results for input(s): HGBA1C in the last 72 hours. CBG: No results for input(s): GLUCAP in the last 168 hours. Lipid Profile: No results for input(s): CHOL, HDL, LDLCALC, TRIG, CHOLHDL, LDLDIRECT in the last 72 hours. Thyroid Function Tests: No results for input(s): TSH, T4TOTAL, FREET4, T3FREE, THYROIDAB in the last 72 hours. Anemia Panel: No results for input(s): VITAMINB12, FOLATE, FERRITIN, TIBC, IRON, RETICCTPCT in the last 72  hours.    Radiology Studies: I have reviewed all of the imaging during this hospital visit personally     Scheduled Meds: . famotidine  40 mg Oral Daily  . fentaNYL (SUBLIMAZE) injection  100 mcg Intravenous Once  . heparin  5,000 Units Subcutaneous Q8H  . multivitamin  1 tablet Oral Daily   Continuous Infusions: . sodium chloride    . sodium chloride 75 mL/hr at 09/24/16 1223     LOS: 3 days        Coralie Keens, MD [facility] Pager [phone]

## 2016-09-26 ENCOUNTER — Inpatient Hospital Stay (HOSPITAL_COMMUNITY): Payer: Medicaid Other

## 2016-09-26 DIAGNOSIS — B182 Chronic viral hepatitis C: Secondary | ICD-10-CM

## 2016-09-26 DIAGNOSIS — I368 Other nonrheumatic tricuspid valve disorders: Secondary | ICD-10-CM

## 2016-09-26 DIAGNOSIS — J439 Emphysema, unspecified: Secondary | ICD-10-CM | POA: Diagnosis present

## 2016-09-26 DIAGNOSIS — F1990 Other psychoactive substance use, unspecified, uncomplicated: Secondary | ICD-10-CM

## 2016-09-26 DIAGNOSIS — K7460 Unspecified cirrhosis of liver: Secondary | ICD-10-CM

## 2016-09-26 LAB — BASIC METABOLIC PANEL
ANION GAP: 5 (ref 5–15)
BUN: 35 mg/dL — AB (ref 6–20)
CALCIUM: 8.1 mg/dL — AB (ref 8.9–10.3)
CO2: 21 mmol/L — AB (ref 22–32)
Chloride: 105 mmol/L (ref 101–111)
Creatinine, Ser: 2.35 mg/dL — ABNORMAL HIGH (ref 0.61–1.24)
GFR calc Af Amer: 37 mL/min — ABNORMAL LOW (ref >60)
GFR calc non Af Amer: 32 mL/min — ABNORMAL LOW (ref >60)
GLUCOSE: 74 mg/dL (ref 65–99)
POTASSIUM: 5.5 mmol/L — AB (ref 3.5–5.1)
Sodium: 131 mmol/L — ABNORMAL LOW (ref 135–145)

## 2016-09-26 NOTE — Progress Notes (Addendum)
PROGRESS NOTE    Johnathan [name]  ZOX:[id number] DOB: 1970-05-13 DOA: 09/22/2016 PCP: Karle Plumber, MD    Brief Narrative:  46 year old male who presented to hospital dyspnea and right-sided chest pain. 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 Was diagnosed with hepatitis C/B and acute left upper extremity deep vein thrombosis. He complained of persistent right-sided chest pain, poor oral intake, worsening weakness and deconditioning. On initial physical examination his blood pressure was 90/77, heart rate 97, temperature 98.3, respiratory 20, oxygen saturation 100%. Moist mucous membranes, lungs had bronchial breath sounds, decreased breath sounds on right, heart S1-S2 present rhythmic, no gallops or murmurs, abdomen soft nontender, no lower extremity edema. His chest x-ray showed right pneumothorax. EKG sinus rhythm with right bundle branch block.   Patient was admitted to the intensive care unit with the working diagnosis of acute spontaneous pneumothorax   Assessment & Plan:   Principal Problem:   Pneumothorax Active Problems:   Chronic hepatitis C (HCC)   IVDU (intravenous drug user)   Endocarditis of tricuspid valve   Cirrhosis of liver with ascites (HCC)   Acute on chronic renal failure (HCC)   Bullous emphysema (HCC)  1. Right pneumothorax, suspected spontaneous, emphysematous bullous disease. Am chest film personally reviewed per PCCM from yesterday. Improved right lung inflation compared from piror film, chest tube to water seal. Repeat chest x-ray pending. If recurrence or worsening will need cardiothoracic consultation, will continue oxymetry monitoring and supplemental oxygen per nasal cannula. Continue pain control, (hydrocodoen and tramadol), out of bed as tolerated, ambulation and physical therapy  evaluation. Follow with nutrition evaluation.   2. Acute kidney injury on stage 3 CKD, due to dehydration, complicated with hyperkalemia.  Renal function improving daily with hydration.Continue to apply nephrotoxic agents. Follow.  3. Hyperglycemia. Patient with poor oral intake.   4. History of IV drug abuse. No clinical signs of withdrawal. Follow.  5. Hepatic cirrhosis, due to viral hepatitis. C and B.  Patient currently asymptomatic. Anicteric. No ascites or encephalopathy. Due to patient's ongoing IV drug use currently not a candidate for treatment. Continue Pepcid for GI prophylaxis. Outpatient follow-up with ID.  6. Hyperkalemia Patient refusing repeat blood counts.Patient also refused Kayexalate. Will repeat labs again in the morning.    DVT prophylaxis: heparin Code Status: full Family Communication: updated patient. No family at bedside. Disposition Plan: Home once medically stable and improved and chest tube has been removed.   Consultants:   Admit to PCCM.  Procedures:  CT Chest 9/16 >>1. Severe upper lobe predominant centrilobular, paraseptal and bullous emphysematous disease of both lungs with right-sided pneumothorax estimated at 30%. A right-sided chest tube is seen within the pneumothorax. No tension noted. 2. 2.2 x 1.8 x 1.7 cm partially opacified right upper lobe bulla possibly stigmata of old prior pneumonic consolidation and residual debris within the bulla. A pulmonary mass is believed less likely. There has been marked clearing of previously noted pneumonic consolidations involving the right lung as well as clearing of bilateral pleural effusions. 3. Thoracic ascending aortic aneurysm at 4.2 cm in caliber.   Antimicrobials:  Vanco 9/16 x1  Zosyn 9/16 x1    SIGNIFICANT EVENTS: 9/16  Admit with pneumothorax  LINES/TUBES: R Chest Tube 9/16 >>   Subjective: Patient sleeping however easily arousable. Denies shortness of breath. No chest  pain. Asking when chest tube can be  removed.  Objective: Vitals:   09/25/16 1100 09/25/16 1333 09/25/16 2211 09/26/16 0430  BP:  116/84 112/69 115/79  Pulse:  68 80 80  Resp:  Temp:  98.3 F (36.8 C) 98.5 F (36.9 C) 98.6 F (37 C)  TempSrc:  Oral Oral Oral  SpO2:  99% 100% 99%  Weight: 60.4 kg (133 lb 3.2 oz)   62.7 kg (138 lb 4.8 oz)  Height:  (1.803 m)       Intake/Output Summary (Last 24 hours) at 09/26/16 1304 Last data filed at 09/26/16 1136  Gross per 24 hour  Intake              480 ml  Output             2025 ml  Net            -1545 ml   Filed Weights   09/25/16 0546 09/25/16 1100 09/26/16 0430  Weight: 60.4 kg (133 lb 3.2 oz) 60.4 kg (133 lb 3.2 oz) 62.7 kg (138 lb 4.8 oz)    Examination:  General exam: Sleeping. NAD.  Respiratory system: Clear to auscultation a nterior lung fields. No wheezes, no crackles, no rhonchi. Right chest tube to waterseal. Respiratory effort normal. Cardiovascular system: S1 & S2 heard, RRR. No JVD, murmurs, rubs, gallops or clicks. No pedal edema. Chest tube intact. Gastrointestinal system: Abdomen is nondistended, soft and nontender. No organomegaly or masses felt. Normal bowel sounds heard. Central nervous system: Alert and oriented. No focal neurological deficits. Extremities: Symmetric 5 x 5 power. Skin: No rashes, lesions or ulcers Psychiatry: Judgement and insight appear normal. Mood & affect appropriate.     Data Reviewed: I have personally reviewed following labs and imaging studies  CBC:  Recent Labs Lab 09/22/16 1428 09/23/16 0313 09/24/16 0421  WBC 12.1* 8.4 6.8  NEUTROABS  --   --  3.3  HGB 10.3* 8.6* 8.0*  HCT 31.5* 25.8* 24.6*  MCV 84.7 83.8 85.7  PLT 195 179 199   Basic Metabolic Panel:  Recent Labs Lab 09/23/16 0313 09/23/16 1618 09/24/16 0421 09/25/16 1120 09/26/16 0302  NA 133* 133* 133* 132* 131*  K 6.1* 6.1* 5.1 5.5* 5.5*  CL 107 106 107 106 105  CO2 18* 20* 23 17* 21*    GLUCOSE 113* 111* 117* 100* 74  BUN 54* 49* 44* 36* 35*  CREATININE 3.72* 3.48* 2.95* 2.72* 2.35*  CALCIUM 8.2* 8.2* 8.0* 8.1* 8.1*  MG 1.8  --   --   --   --   PHOS 4.7*  --   --   --   --    GFR: Estimated Creatinine Clearance: 35.2 mL/min (A) (by C-G formula based on SCr of 2.35 mg/dL (H)). Liver Function Tests:  Recent Labs Lab 09/22/16 1428  AST 30  ALT 25  ALKPHOS 176*  BILITOT 0.5  PROT 7.5  ALBUMIN 2.8*   No results for input(s): LIPASE, AMYLASE in the last 168 hours. No results for input(s): AMMONIA in the last 168 hours. Coagulation Profile: No results for input(s): INR, PROTIME in the last 168 hours. Cardiac Enzymes: No results for input(s): CKTOTAL, CKMB, CKMBINDEX, TROPONINI in the last 168 hours. BNP (last 3 results) No results for input(s): PROBNP in the last 8760 hours. HbA1C: No results for input(s): HGBA1C in the last 72 hours. CBG: No results for input(s): GLUCAP in the last 168 hours. Lipid Profile: No results for input(s): CHOL, HDL, LDLCALC, TRIG, CHOLHDL,  LDLDIRECT in the last 72 hours. Thyroid Function Tests: No results for input(s): TSH, T4TOTAL, FREET4, T3FREE, THYROIDAB in the last 72 hours. Anemia Panel: No results for input(s): VITAMINB12, FOLATE, FERRITIN, TIBC, IRON, RETICCTPCT in the last 72 hours. Sepsis Labs:  Recent Labs Lab 09/22/16 1438 09/22/16 1604 09/23/16 0313  LATICACIDVEN 3.02* 2.31* 0.8    Recent Results (from the past 240 hour(s))  Blood Culture (routine x 2)     Status: None (Preliminary result)   Collection Time: 09/22/16  3:52 PM  Result Value Ref Range Status   Specimen Description BLOOD RIGHT ANTECUBITAL  Final   Special Requests IN PEDIATRIC BOTTLE Blood Culture adequate volume  Final   Culture NO GROWTH 3 DAYS  Final   Report Status PENDING  Incomplete  Blood Culture (routine x 2)     Status: None (Preliminary result)   Collection Time: 09/22/16  3:58 PM  Result Value Ref Range Status   Specimen  Description BLOOD LEFT ANTECUBITAL  Final   Special Requests IN PEDIATRIC BOTTLE Blood Culture adequate volume  Final   Culture NO GROWTH 3 DAYS  Final   Report Status PENDING  Incomplete         Radiology Studies: Dg Chest Port 1 View  Result Date: 09/25/2016 CLINICAL DATA:  Respiratory failure.  Shortness of breath. EXAM: PORTABLE CHEST 1 VIEW COMPARISON:  09/23/2016 FINDINGS: Shallow inspiration. Heart size and pulmonary vascularity are normal. Emphysematous changes in the upper lungs. Right chest tube remains unchanged in position. Tiny residual right pneumothorax is smaller than on previous study. Small amount of subcutaneous emphysema in the right chest wall. Right rib fractures. Left lung is clear. No blunting of costophrenic angles. IMPRESSION: Minimal residual right pneumothorax with right chest tube in place. Subcutaneous emphysema in the right chest wall. Right rib fractures. Emphysematous changes in the lungs. Electronically Signed   By: Burman Nieves M.D.   On: 09/25/2016 02:13        Scheduled Meds: . famotidine  40 mg Oral Daily  . heparin  5,000 Units Subcutaneous Q8H  . Influenza vac split quadrivalent PF  0.5 mL Intramuscular Tomorrow-1000  . multivitamin  1 tablet Oral Daily   Continuous Infusions: . sodium chloride    . sodium chloride 75 mL/hr at 09/24/16 1223     LOS: 4 days    Time spent: 35 MINS    Kamari Buch, MD [facility] Pager [phone]  If 7PM-7AM, please contact night-coverage [email or web address] Password [email or web address] 09/26/2016, 1:04 PM

## 2016-09-26 NOTE — Progress Notes (Signed)
Pt refused prescribed iv fluid and continous pulsat, pt educated on the need to have it but still refused will continue to monitor pt

## 2016-09-26 NOTE — Progress Notes (Signed)
Pt.called & c/o of like somethng is stuck on his throat & noticed sl.pinkish drainage on the tubing of the chest tube the patient.is not in resp.distress oxygen sat is 99-100 % in RA.Called rapid response nurse & made her aware & she siad to ask the radiology to do the [facility] now instead of 0500 .

## 2016-09-26 NOTE — Progress Notes (Signed)
PT Cancellation Note  Patient Details Name: Johnathan [name] MRN: [id number] DOB: 11-Jan-1970   Cancelled Treatment:    Reason Eval/Treat Not Completed: PT screened, no needs identified, will sign off.  Pt stated that he did not want Physical Therapy, did not need PT  AND want the RN to pull the tube so he could d/c. Based on pt's wishes, will sign off at this time.  09/26/2016  [name], PT [phone]  (pager)   [name] 09/26/2016, 4:39 PM

## 2016-09-26 NOTE — Progress Notes (Signed)
PULMONARY / CRITICAL CARE MEDICINE   Name: Johnathan [name] MRN: [id number] DOB: Aug 12, 1970    ADMISSION DATE:  09/22/2016 CONSULTATION DATE:  09/22/16  REFERRING MD:  Dr. Ethelda Chick   CHIEF COMPLAINT:  SOB, right sided chest pain  HISTORY OF PRESENT ILLNESS:   46 year old male who presented to [facility] ER on 9/16 with complaints of ongoing shortness of breath and right-sided chest pain.  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  There were initial concerns for potential meningitis however CSF cultures were negative. Repeat blood cultures prior to discharge were negative. Patient was identified to be hepatitis C and hepatitis B positive (felt not to be a candidate for treatment due to drug abuse). Hospital course was further complicated by left upper extremity radial vein DVT (PE negative, decision made not to use systemic anticoagulation given high risk for bleeding/prolonged chest tube), AKI, metabolic encephalopathy, protein calorie malnutrition and anemia.  The patient's family report that he has not felt well since discharge. He has had persistent shortness of breath and right-sided chest pain. The patient has had poor PO intake and almost daily nausea and vomiting (to the point that his 11 year old son brings him a popsicle and a trash can every morning). He reports a continued sensation of something stuck in the back of his throat, increased cough but no significant sputum production. Family also reports that he fell down the stairs striking his back. The patient filled discharge medications and has been taking them since discharge. He denies IV drug use but states he has used pain tablet on a couple of occasions due to chest pain (family reports he is pretty candidate regarding his IV drug  abuse).    Initial ER evaluation found him to be mildly hypotensive.  CXR revealed a moderate right sided pneumothorax.    PCCM consulted for Pneumothorax.    SUBJECTIVE:  No c/o. Sleepy.   VITAL SIGNS: BP 115/79 (BP Location: Right Arm)   Pulse 80   Temp 98.6 F (37 C) (Oral)   Resp 17   Ht  (1.803 m)   Wt 62.7 kg (138 lb 4.8 oz)   SpO2 99%   BMI 19.29 kg/m    INTAKE / OUTPUT: I/O last 3 completed shifts: In: 840 [P.O.:840] Out: 3065 [Urine:3065]  PHYSICAL EXAMINATION: General: Adult male sleeping in bed  HEENT: MM pink and moist, normocephalic. atraumatic Neuro: drowsy but easily arousable, appropriate CV: s1s2 rrr, no m/r/g PULM: resps even/non-labored on RA, clear, no palpable subq air, R chest tube to water seal, no air leak  OZ:HYQM, non-tender, bsx4 active  Extremities: warm/dry, no edema  Skin: warm and dry, no rashes or lesions, multiple tattoos  LABS:  BMET  Recent Labs Lab 09/24/16 0421 09/25/16 1120 09/26/16 0302  NA 133* 132* 131*  K 5.1 5.5* 5.5*  CL 107 106 105  CO2 23 17* 21*  BUN 44* 36* 35*  CREATININE 2.95* 2.72* 2.35*  GLUCOSE 117* 100* 74    Electrolytes  Recent Labs Lab 09/23/16 0313  09/24/16 0421 09/25/16 1120 09/26/16 0302  CALCIUM 8.2*  < > 8.0* 8.1* 8.1*  MG 1.8  --   --   --   --   PHOS 4.7*  --   --   --   --   < > = values in this interval not  displayed.  CBC  Recent Labs Lab 09/22/16 1428 09/23/16 0313 09/24/16 0421  WBC 12.1* 8.4 6.8  HGB 10.3* 8.6* 8.0*  HCT 31.5* 25.8* 24.6*  PLT 195 179 199    Coag's No results for input(s): APTT, INR in the last 168 hours.  Sepsis Markers  Recent Labs Lab 09/22/16 1438 09/22/16 1604 09/23/16 0313  LATICACIDVEN 3.02* 2.31* 0.8    ABG No results for input(s): PHART, PCO2ART, PO2ART in the last 168 hours.  Liver Enzymes  Recent Labs Lab 09/22/16 1428  AST 30  ALT 25  ALKPHOS 176*  BILITOT 0.5  ALBUMIN 2.8*    Cardiac Enzymes No results  for input(s): TROPONINI, PROBNP in the last 168 hours.  Glucose No results for input(s): GLUCAP in the last 168 hours.  Imaging No results found.   STUDIES:  CT Chest 9/16 >>1. Severe upper lobe predominant centrilobular, paraseptal and bullous emphysematous disease of both lungs with right-sided pneumothorax estimated at 30%. A right-sided chest tube is seen within the pneumothorax. No tension noted. 2. 2.2 x 1.8 x 1.7 cm partially opacified right upper lobe bulla possibly stigmata of old prior pneumonic consolidation and residual debris within the bulla. A pulmonary mass is believed less likely. There has been marked clearing of previously noted pneumonic consolidations involving the right lung as well as clearing of bilateral pleural effusions. 3. Thoracic ascending aortic aneurysm at 4.2 cm in caliber.  CULTURES: BCx2 9/16 >>   ANTIBIOTICS: Vanco 9/16 x1  Zosyn 9/16 x1   SIGNIFICANT EVENTS: 9/16  Admit with pneumothorax  LINES/TUBES: R Chest Tube 9/16 >>   DISCUSSION: 46 y/o M admitted with right sided pneumothorax   ASSESSMENT / PLAN:   Right Pneumothorax - ~30%, now s/p chest tube placement with significant improvement.   Had small degree of re-collapse when CT taken off suction 9/17 ? Chest Wall Defect -- Prior to chest tube placement, patient had silver dollar size rounded area near chest tube that would pop out on exhalation. NO obvious chest wall defect noted on CT.  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  PCCM will continue to follow for chest tube mgmt.   FAMILY  - Updates: pt updated 9/19  Dirk Dress,  NP 09/26/2016  9:45 AM Pager: [phone] or [phone]  Attending Note:  46 year old male with IVDA history, septic emboli and severe pulmonary bullous disease on CT presenting to PCCM with spontaneous PTX.  On exam, distant BS bilaterally.  I reviewed CXR myself, residual PTX noted but today's is still pending, will need to review later.  CT in place with no leak.  Discussed with PCCM-NP.  PTX:  - Maintain CT to water seal  - CXR today and in AM.             - Pending CXR today, if worse will definitely need CVTS involvement  Empyema:   - Completed course of abx  - Follow fever and WBC curves  Bullous lung disease:             - May need pleurodesis if more  PTX  PCCM will follow.  Patient seen and examined, agree with above note.  I dictated the care and orders written for this patient under my direction.  Alyson Reedy, MD [phone]

## 2016-09-26 NOTE — Progress Notes (Signed)
Portable CXR done ,&  Results relayed to MD on call Schorr & said to let the pulmonologist in am.Will coninue to monitor pt.

## 2016-09-27 ENCOUNTER — Inpatient Hospital Stay (HOSPITAL_COMMUNITY): Payer: Medicaid Other

## 2016-09-27 DIAGNOSIS — N189 Chronic kidney disease, unspecified: Secondary | ICD-10-CM

## 2016-09-27 DIAGNOSIS — N179 Acute kidney failure, unspecified: Secondary | ICD-10-CM

## 2016-09-27 LAB — CBC WITH DIFFERENTIAL/PLATELET
BASOS ABS: 0 10*3/uL (ref 0.0–0.1)
Basophils Relative: 1 %
EOS ABS: 0.1 10*3/uL (ref 0.0–0.7)
EOS PCT: 1 %
HCT: 26.9 % — ABNORMAL LOW (ref 39.0–52.0)
Hemoglobin: 8.7 g/dL — ABNORMAL LOW (ref 13.0–17.0)
Lymphocytes Relative: 42 %
Lymphs Abs: 2.8 10*3/uL (ref 0.7–4.0)
MCH: 27.8 pg (ref 26.0–34.0)
MCHC: 32.3 g/dL (ref 30.0–36.0)
MCV: 85.9 fL (ref 78.0–100.0)
Monocytes Absolute: 0.3 10*3/uL (ref 0.1–1.0)
Monocytes Relative: 5 %
Neutro Abs: 3.4 10*3/uL (ref 1.7–7.7)
Neutrophils Relative %: 51 %
PLATELETS: 209 10*3/uL (ref 150–400)
RBC: 3.13 MIL/uL — AB (ref 4.22–5.81)
RDW: 15 % (ref 11.5–15.5)
WBC: 6.6 10*3/uL (ref 4.0–10.5)

## 2016-09-27 LAB — CULTURE, BLOOD (ROUTINE X 2)
Culture: NO GROWTH
Culture: NO GROWTH
SPECIAL REQUESTS: ADEQUATE
Special Requests: ADEQUATE

## 2016-09-27 LAB — BASIC METABOLIC PANEL
ANION GAP: 4 — AB (ref 5–15)
Anion gap: 4 — ABNORMAL LOW (ref 5–15)
BUN: 37 mg/dL — ABNORMAL HIGH (ref 6–20)
BUN: 36 mg/dL — AB (ref 6–20)
CALCIUM: 8.4 mg/dL — AB (ref 8.9–10.3)
CALCIUM: 8.9 mg/dL (ref 8.9–10.3)
CO2: 24 mmol/L (ref 22–32)
CO2: 24 mmol/L (ref 22–32)
Chloride: 104 mmol/L (ref 101–111)
Chloride: 106 mmol/L (ref 101–111)
Creatinine, Ser: 2.29 mg/dL — ABNORMAL HIGH (ref 0.61–1.24)
Creatinine, Ser: 2.26 mg/dL — ABNORMAL HIGH (ref 0.61–1.24)
GFR calc Af Amer: 38 mL/min — ABNORMAL LOW (ref >60)
GFR calc Af Amer: 39 mL/min — ABNORMAL LOW (ref >60)
GFR, EST NON AFRICAN AMERICAN: 33 mL/min — AB (ref >60)
GFR, EST NON AFRICAN AMERICAN: 33 mL/min — AB (ref >60)
Glucose, Bld: 127 mg/dL — ABNORMAL HIGH (ref 65–99)
Glucose, Bld: 110 mg/dL — ABNORMAL HIGH (ref 65–99)
POTASSIUM: 5.5 mmol/L — AB (ref 3.5–5.1)
Potassium: 5.6 mmol/L — ABNORMAL HIGH (ref 3.5–5.1)
SODIUM: 132 mmol/L — AB (ref 135–145)
SODIUM: 134 mmol/L — AB (ref 135–145)

## 2016-09-27 MED ORDER — SODIUM POLYSTYRENE SULFONATE 15 GM/60ML PO SUSP
45.0000 g | Freq: Once | ORAL | Status: AC
  Administered 2016-09-27: 45 g via ORAL
  Filled 2016-09-27: qty 180

## 2016-09-27 NOTE — Progress Notes (Signed)
Pt refused warm compress

## 2016-09-27 NOTE — Progress Notes (Signed)
PROGRESS NOTE    Johnathan [name]  ZOX:[id number] DOB: [birth year] DOA: 09/22/2016 PCP: Karle Plumber, MD    Brief Narrative:  46 year old male who presented to hospital dyspnea and right-sided chest pain. 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 Was diagnosed with hepatitis C/B and acute left upper extremity deep vein thrombosis. He complained of persistent right-sided chest pain, poor oral intake, worsening weakness and deconditioning. On initial physical examination his blood pressure was 90/77, heart rate 97, temperature 98.3, respiratory 20, oxygen saturation 100%. Moist mucous membranes, lungs had bronchial breath sounds, decreased breath sounds on right, heart S1-S2 present rhythmic, no gallops or murmurs, abdomen soft nontender, no lower extremity edema. His chest x-ray showed right pneumothorax. EKG sinus rhythm with right bundle branch block.   Patient was admitted to the intensive care unit with the working diagnosis of acute spontaneous pneumothorax   Assessment & Plan:   Principal Problem:   Pneumothorax Active Problems:   Chronic hepatitis C (HCC)   IVDU (intravenous drug user)   Endocarditis of tricuspid valve   Cirrhosis of liver with ascites (HCC)   Acute on chronic renal failure (HCC)   Bullous emphysema (HCC)  1. Right pneumothorax, suspected spontaneous, emphysematous bullous disease. Per chest film personally reviewed per PCCM from yesterday. Improved right lung inflation compared from piror film, chest tube to water seal. Repeat chest x-ray with resolution of pneumothorax. Chest tube has been clamped by critical care. Repeat chest x-ray pending for the morning. If recurrence or worsening will need cardiothoracic consultation, will continue oxymetry monitoring and supplemental oxygen per nasal  cannula. Continue pain control, (hydrocodoen and tramadol), out of bed as tolerated, ambulation and physical therapy evaluation. Follow with nutrition evaluation.   2. Acute kidney injury on stage 3 CKD, due to dehydration, complicated with hyperkalemia.  Renal function improved daily with hydration.Continue to apply nephrotoxic agents. Follow.  3. Hyperglycemia. Patient with poor oral intake.   4. History of IV drug abuse. No clinical signs of withdrawal. Follow.  5. Hepatic cirrhosis, due to viral hepatitis. C and B.  Patient currently asymptomatic. Anicteric. No ascites or encephalopathy. Due to patient's ongoing IV drug use currently not a candidate for treatment. Continue Pepcid for GI prophylaxis. Patient was on Lasix and spironolactone prior to admission however due to hypotension dose of an discontinued. Patient with no signs of volume overload. Outpatient follow-up with ID.  6. Hyperkalemia Patient refusing repeat blood counts.Patient also refused Kayexalate. The use of Kayexalate was explained to patient and patient was in agreement to take Kayexalate. Repeat potassium levels 2-3 hours post Kayexalate. Place on a renal diet if hyperkalemia continues to be an issue.    DVT prophylaxis: heparin Code Status: full Family Communication: updated patient. No family at bedside. Disposition Plan: Home once medically stable and improved and chest tube has been removed.   Consultants:   Admit to PCCM.  Procedures:  CT Chest 9/16 >>1. Severe upper lobe predominant centrilobular, paraseptal and bullous emphysematous disease of both lungs with right-sided pneumothorax estimated at 30%. A right-sided chest tube is seen within the pneumothorax. No tension noted. 2. 2.2 x 1.8 x 1.7 cm partially opacified right upper lobe bulla possibly stigmata of old prior pneumonic consolidation and residual debris within the bulla. A pulmonary mass is believed less likely. There has been marked  clearing of previously noted pneumonic consolidations involving  the right lung as well as clearing of bilateral pleural effusions. 3. Thoracic ascending aortic aneurysm at 4.2 cm in caliber.   Antimicrobials:  Vanco 9/16 x1  Zosyn 9/16 x1    SIGNIFICANT EVENTS: 9/16  Admit with pneumothorax  LINES/TUBES: R Chest Tube 9/16 >>   Subjective: Patient asking for chest tube to be removed. Patient states he is tired of having the chest tube. Patient also refusing Kayexalate stating that he had a bowel movement already. Patient denies any chest pain. No shortness of breath. Patient states he needs to leave.  Objective: Vitals:   09/26/16 2020 09/27/16 0109 09/27/16 0517 09/27/16 1317  BP: 116/75  106/78 108/69  Pulse: 71  63 (!) 58  Resp: Temp: 98.2 F (36.8 C)  98.4 F (36.9 C) 97.9 F (36.6 C)  TempSrc: Oral  Oral Oral  SpO2: 100%  100% 99%  Weight:  61.9 kg (136 lb 8 oz)    Height:        Intake/Output Summary (Last 24 hours) at 09/27/16 1717 Last data filed at 09/27/16 1003  Gross per 24 hour  Intake             1160 ml  Output             1125 ml  Net               35 ml   Filed Weights   09/25/16 1100 09/26/16 0430 09/27/16 0109  Weight: 60.4 kg (133 lb 3.2 oz) 62.7 kg (138 lb 4.8 oz) 61.9 kg (136 lb 8 oz)    Examination:  General exam: NAD.  Respiratory system: Clear to auscultation a nterior lung fields. No wheezes, no crackles, no rhonchi. Right chest tube clamped. Respiratory effort normal. Cardiovascular system: S1 & S2 heard, RRR. No JVD, murmurs, rubs, gallops or clicks. No pedal edema. Chest tube intact. Gastrointestinal system: Abdomen is nondistended, soft and nontender. No organomegaly or masses felt. Normal bowel sounds heard. Central nervous system: Alert and oriented. No focal neurological deficits. Extremities: Symmetric 5 x 5 power. Skin: No rashes, lesions or ulcers Psychiatry: Judgement and insight appear normal. Mood & affect  appropriate.     Data Reviewed: I have personally reviewed following labs and imaging studies  CBC:  Recent Labs Lab 09/22/16 1428 09/23/16 0313 09/24/16 0421 09/27/16 0319  WBC 12.1* 8.4 6.8 6.6  NEUTROABS  --   --  3.3 3.4  HGB 10.3* 8.6* 8.0* 8.7*  HCT 31.5* 25.8* 24.6* 26.9*  MCV 84.7 83.8 85.7 85.9  PLT 195 179 199 209   Basic Metabolic Panel:  Recent Labs Lab 09/23/16 0313 09/23/16 1618 09/24/16 0421 09/25/16 1120 09/26/16 0302 09/27/16 0319  NA 133* 133* 133* 132* 131* 132*  K 6.1* 6.1* 5.1 5.5* 5.5* 5.6*  CL 107 106 107 106 105 104  CO2 18* 20* 23 17* 21* 24  GLUCOSE 113* 111* 117* 100* 74 127*  BUN 54* 49* 44* 36* 35* 37*  CREATININE 3.72* 3.48* 2.95* 2.72* 2.35* 2.29*  CALCIUM 8.2* 8.2* 8.0* 8.1* 8.1* 8.4*  MG 1.8  --   --   --   --   --   PHOS 4.7*  --   --   --   --   --    GFR: Estimated Creatinine Clearance: 35.7 mL/min (A) (by C-G formula based on SCr of 2.29 mg/dL (H)). Liver Function Tests:  Recent Labs Lab 09/22/16 1428  AST 30  ALT 25  ALKPHOS 176*  BILITOT 0.5  PROT 7.5  ALBUMIN 2.8*   No results for input(s): LIPASE, AMYLASE in the last 168 hours. No results for input(s): AMMONIA in the last 168 hours. Coagulation Profile: No results for input(s): INR, PROTIME in the last 168 hours. Cardiac Enzymes: No results for input(s): CKTOTAL, CKMB, CKMBINDEX, TROPONINI in the last 168 hours. BNP (last 3 results) No results for input(s): PROBNP in the last 8760 hours. HbA1C: No results for input(s): HGBA1C in the last 72 hours. CBG: No results for input(s): GLUCAP in the last 168 hours. Lipid Profile: No results for input(s): CHOL, HDL, LDLCALC, TRIG, CHOLHDL, LDLDIRECT in the last 72 hours. Thyroid Function Tests: No results for input(s): TSH, T4TOTAL, FREET4, T3FREE, THYROIDAB in the last 72 hours. Anemia Panel: No results for input(s): VITAMINB12, FOLATE, FERRITIN, TIBC, IRON, RETICCTPCT in the last 72 hours. Sepsis  Labs:  Recent Labs Lab 09/22/16 1438 09/22/16 1604 09/23/16 0313  LATICACIDVEN 3.02* 2.31* 0.8    Recent Results (from the past 240 hour(s))  Blood Culture (routine x 2)     Status: None   Collection Time: 09/22/16  3:52 PM  Result Value Ref Range Status   Specimen Description BLOOD RIGHT ANTECUBITAL  Final   Special Requests IN PEDIATRIC BOTTLE Blood Culture adequate volume  Final   Culture NO GROWTH 5 DAYS  Final   Report Status 09/27/2016 FINAL  Final  Blood Culture (routine x 2)     Status: None   Collection Time: 09/22/16  3:58 PM  Result Value Ref Range Status   Specimen Description BLOOD LEFT ANTECUBITAL  Final   Special Requests IN PEDIATRIC BOTTLE Blood Culture adequate volume  Final   Culture NO GROWTH 5 DAYS  Final   Report Status 09/27/2016 FINAL  Final         Radiology Studies: Dg Chest 2 View  Result Date: 09/26/2016 CLINICAL DATA:  46 year old male with recurrent pneumothorax, right chest tube remains in place. Shortness of breath for 1 week. Smoker. EXAM: CHEST  2 VIEW COMPARISON:  09/25/2016 and earlier. FINDINGS: Seated upright AP and lateral views of the chest. Stable right lateral approach pigtail type chest tube. Skin fold artifact on the frontal view between the posterior fourth and fifth ribs. No pneumothorax identified. Emphysema. Mediastinal contours remain within normal limits. Visualized tracheal air column is within normal limits. No pulmonary edema. Blunting of both posterior costophrenic angle suggesting small pleural effusions. Mild patchy opacity along the course of the right chest tube. No other acute pulmonary opacity. No acute osseous abnormality identified. Negative visible bowel gas pattern. IMPRESSION: 1. Stable right chest tube with no residual pneumothorax. 2. Small bilateral pleural effusions are new since 09/22/2016. 3. Emphysema. Electronically Signed   By: Odessa Fleming M.D.   On: 09/26/2016 15:18   Dg Chest Portable 1 View  Result Date:  09/27/2016 CLINICAL DATA:  Pneumothorax EXAM: PORTABLE CHEST 1 VIEW COMPARISON:  09/26/2016 FINDINGS: Stable right chest tube. No pneumothorax. Emphysema in the right axillary soft tissues. Blebs at both lung apices are stable. Patchy density in the lateral mid right lung stable. Normal heart size. IMPRESSION: Stable.  No right pneumothorax. Electronically Signed   By: Jolaine Click M.D.   On: 09/27/2016 07:43        Scheduled Meds: . famotidine  40 mg Oral Daily  . heparin  5,000 Units Subcutaneous Q8H  . Influenza vac split quadrivalent PF  0.5 mL Intramuscular Tomorrow-1000  . multivitamin  1 tablet Oral Daily   Continuous Infusions: . sodium chloride    . sodium chloride 75 mL/hr at 09/24/16 1223     LOS: 5 days    Time spent: 35 MINS    Tishie Altmann, MD [facility] Pager [phone]  If 7PM-7AM, please contact night-coverage [email or web address] Password [facility] 09/27/2016, 5:17 PM

## 2016-09-27 NOTE — Progress Notes (Signed)
PULMONARY / CRITICAL CARE MEDICINE   Name: Johnathan [name] MRN: [id number] DOB: 1970/06/29    ADMISSION DATE:  09/22/2016 CONSULTATION DATE:  09/22/16  REFERRING MD:  Dr. Ethelda Chick   CHIEF COMPLAINT:  SOB, right sided chest pain  HISTORY OF PRESENT ILLNESS:   46 year old male who presented to [facility] ER on 9/16 with complaints of ongoing shortness of breath and right-sided chest pain.  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  There were initial concerns for potential meningitis however CSF cultures were negative. Repeat blood cultures prior to discharge were negative. Patient was identified to be hepatitis C and hepatitis B positive (felt not to be a candidate for treatment due to drug abuse). Hospital course was further complicated by left upper extremity radial vein DVT (PE negative, decision made not to use systemic anticoagulation given high risk for bleeding/prolonged chest tube), AKI, metabolic encephalopathy, protein calorie malnutrition and anemia.  The patient's family report that he has not felt well since discharge. He has had persistent shortness of breath and right-sided chest pain. The patient has had poor PO intake and almost daily nausea and vomiting (to the point that his 41 year old son brings him a popsicle and a trash can every morning). He reports a continued sensation of something stuck in the back of his throat, increased cough but no significant sputum production. Family also reports that he fell down the stairs striking his back. The patient filled discharge medications and has been taking them since discharge. He denies IV drug use but states he has used pain tablet on a couple of occasions due to chest pain (family reports he is pretty candidate regarding his IV drug  abuse).    Initial ER evaluation found him to be mildly hypotensive.  CXR revealed a moderate right sided pneumothorax.    PCCM consulted for Pneumothorax.    SUBJECTIVE:  No c/o. Sleepy.   VITAL SIGNS: BP 106/78 (BP Location: Right Arm)   Pulse 63   Temp 98.4 F (36.9 C) (Oral)   Resp 20   Ht  (1.803 m)   Wt 61.9 kg (136 lb 8 oz)   SpO2 100%   BMI 19.04 kg/m    INTAKE / OUTPUT: I/O last 3 completed shifts: In: 1160 [P.O.:1160] Out: 2250 [Urine:2250]  PHYSICAL EXAMINATION: General: Adult male sleeping in bed  HEENT: MM pink and moist, normocephalic. atraumatic Neuro: drowsy but easily arousable, appropriate CV: s1s2 rrr, no m/r/g PULM: resps even/non-labored on RA, clear, no palpable subq air, R chest tube to water seal, no air leak  WU:JWJX, non-tender, bsx4 active  Extremities: warm/dry, no edema  Skin: warm and dry, no rashes or lesions, multiple tattoos  LABS:  BMET  Recent Labs Lab 09/25/16 1120 09/26/16 0302 09/27/16 0319  NA 132* 131* 132*  K 5.5* 5.5* 5.6*  CL 106 105 104  CO2 17* 21* 24  BUN 36* 35* 37*  CREATININE 2.72* 2.35* 2.29*  GLUCOSE 100* 74 127*    Electrolytes  Recent Labs Lab 09/23/16 0313  09/25/16 1120 09/26/16 0302 09/27/16 0319  CALCIUM 8.2*  < > 8.1* 8.1* 8.4*  MG 1.8  --   --   --   --   PHOS 4.7*  --   --   --   --   < > = values in this interval not  displayed.  CBC  Recent Labs Lab 09/23/16 0313 09/24/16 0421 09/27/16 0319  WBC 8.4 6.8 6.6  HGB 8.6* 8.0* 8.7*  HCT 25.8* 24.6* 26.9*  PLT 179 199 209    Coag's No results for input(s): APTT, INR in the last 168 hours.  Sepsis Markers  Recent Labs Lab 09/22/16 1438 09/22/16 1604 09/23/16 0313  LATICACIDVEN 3.02* 2.31* 0.8    ABG No results for input(s): PHART, PCO2ART, PO2ART in the last 168 hours.  Liver Enzymes  Recent Labs Lab 09/22/16 1428  AST 30  ALT 25  ALKPHOS 176*  BILITOT 0.5  ALBUMIN 2.8*    Cardiac Enzymes No  results for input(s): TROPONINI, PROBNP in the last 168 hours.  Glucose No results for input(s): GLUCAP in the last 168 hours.  Imaging Dg Chest 2 View  Result Date: 09/26/2016 CLINICAL DATA:  46 year old male with recurrent pneumothorax, right chest tube remains in place. Shortness of breath for 1 week. Smoker. EXAM: CHEST  2 VIEW COMPARISON:  09/25/2016 and earlier. FINDINGS: Seated upright AP and lateral views of the chest. Stable right lateral approach pigtail type chest tube. Skin fold artifact on the frontal view between the posterior fourth and fifth ribs. No pneumothorax identified. Emphysema. Mediastinal contours remain within normal limits. Visualized tracheal air column is within normal limits. No pulmonary edema. Blunting of both posterior costophrenic angle suggesting small pleural effusions. Mild patchy opacity along the course of the right chest tube. No other acute pulmonary opacity. No acute osseous abnormality identified. Negative visible bowel gas pattern. IMPRESSION: 1. Stable right chest tube with no residual pneumothorax. 2. Small bilateral pleural effusions are new since 09/22/2016. 3. Emphysema. Electronically Signed   By: Odessa Fleming M.D.   On: 09/26/2016 15:18   Dg Chest Portable 1 View  Result Date: 09/27/2016 CLINICAL DATA:  Pneumothorax EXAM: PORTABLE CHEST 1 VIEW COMPARISON:  09/26/2016 FINDINGS: Stable right chest tube. No pneumothorax. Emphysema in the right axillary soft tissues. Blebs at both lung apices are stable. Patchy density in the lateral mid right lung stable. Normal heart size. IMPRESSION: Stable.  No right pneumothorax. Electronically Signed   By: Jolaine Click M.D.   On: 09/27/2016 07:43     STUDIES:  CT Chest 9/16 >>1. Severe upper lobe predominant centrilobular, paraseptal and bullous emphysematous disease of both lungs with right-sided pneumothorax estimated at 30%. A right-sided chest tube is seen within the pneumothorax. No tension noted. 2. 2.2 x 1.8  x 1.7 cm partially opacified right upper lobe bulla possibly stigmata of old prior pneumonic consolidation and residual debris within the bulla. A pulmonary mass is believed less likely. There has been marked clearing of previously noted pneumonic consolidations involving the right lung as well as clearing of bilateral pleural effusions. 3. Thoracic ascending aortic aneurysm at 4.2 cm in caliber.  CULTURES: BCx2 9/16 >>   ANTIBIOTICS: Vanco 9/16 x1  Zosyn 9/16 x1   SIGNIFICANT EVENTS: 9/16  Admit with pneumothorax  LINES/TUBES: R Chest Tube 9/16 >>   DISCUSSION: 46 y/o M admitted with right sided pneumothorax   ASSESSMENT / PLAN:   Right Pneumothorax - ~30%, now s/p chest tube placement with significant improvement.   Had small degree of re-collapse when CT taken off suction 9/17 ? Chest Wall Defect -- Prior to chest tube placement, patient had silver dollar size rounded area near chest tube that would pop out on exhalation. NO obvious chest wall defect noted on CT.  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  On exam, distant BS bilaterally.  I reviewed CXR myself, PTX resolved on the right.  CT in place with no leak.  Discussed with PCCM-NP and Dr. Janee Morn from [facility].  PTX:  - Clamp chest tube  - CXR in AM.             - If PTX is not present in AM then remove CT, if recurs then will need CVTS  Empyema:   - Completed course of abx  - Follow fever and WBC curves  Bullous lung disease:             - May need pleurodesis PTX recurs  PCCM will follow.  Patient seen and examined, agree with above note.  I dictated the care and orders written for this patient under my direction.  Alyson Reedy,  MD [phone]

## 2016-09-28 ENCOUNTER — Inpatient Hospital Stay (HOSPITAL_COMMUNITY): Payer: Medicaid Other

## 2016-09-28 DIAGNOSIS — J9383 Other pneumothorax: Secondary | ICD-10-CM

## 2016-09-28 LAB — BASIC METABOLIC PANEL
ANION GAP: 6 (ref 5–15)
BUN: 32 mg/dL — ABNORMAL HIGH (ref 6–20)
CALCIUM: 8 mg/dL — AB (ref 8.9–10.3)
CO2: 25 mmol/L (ref 22–32)
Chloride: 103 mmol/L (ref 101–111)
Creatinine, Ser: 1.95 mg/dL — ABNORMAL HIGH (ref 0.61–1.24)
GFR, EST AFRICAN AMERICAN: 46 mL/min — AB (ref >60)
GFR, EST NON AFRICAN AMERICAN: 40 mL/min — AB (ref >60)
Glucose, Bld: 102 mg/dL — ABNORMAL HIGH (ref 65–99)
POTASSIUM: 4.3 mmol/L (ref 3.5–5.1)
Sodium: 134 mmol/L — ABNORMAL LOW (ref 135–145)

## 2016-09-28 LAB — CBC WITH DIFFERENTIAL/PLATELET
BASOS ABS: 0 10*3/uL (ref 0.0–0.1)
BASOS PCT: 0 %
Eosinophils Absolute: 0.1 10*3/uL (ref 0.0–0.7)
Eosinophils Relative: 1 %
HEMATOCRIT: 24.5 % — AB (ref 39.0–52.0)
HEMOGLOBIN: 8 g/dL — AB (ref 13.0–17.0)
LYMPHS PCT: 41 %
Lymphs Abs: 2.6 10*3/uL (ref 0.7–4.0)
MCH: 28 pg (ref 26.0–34.0)
MCHC: 32.7 g/dL (ref 30.0–36.0)
MCV: 85.7 fL (ref 78.0–100.0)
Monocytes Absolute: 0.3 10*3/uL (ref 0.1–1.0)
Monocytes Relative: 5 %
NEUTROS ABS: 3.4 10*3/uL (ref 1.7–7.7)
NEUTROS PCT: 53 %
Platelets: 189 10*3/uL (ref 150–400)
RBC: 2.86 MIL/uL — AB (ref 4.22–5.81)
RDW: 14.6 % (ref 11.5–15.5)
WBC: 6.4 10*3/uL (ref 4.0–10.5)

## 2016-09-28 NOTE — Progress Notes (Signed)
Patient had chest tube removed by Richardson Landry Minor, NP then immediately called Dad to come pick him up. His Dad then called me to verify discharge and I told him that he was not discharged. After hanging up the phone with patient's Dad I immediately started down the hall to his room where I met him coming up the hall. I told patient that I spoke with his Dad and he was aware he needed to stay for follow up chest x-ray today and tomorrow am. I tried to explain the importance to stay for x-ray but patient was adamant about leaving. He removed his IV in the room and I didn't have time to grab an AMA form. He wouldn't even stop to talk to me at the desk.  I talked to him as he walked down the hall leaving. Dr. Grandville Silos arrived on unit with minutes of patient leaving and updated him on the situation.

## 2016-09-28 NOTE — Discharge Summary (Signed)
Physician Discharge Summary  Mack Alvidrez ZOX:[id number] DOB: 16-Sep-1970 DOA: 09/22/2016  PCP: Karle Plumber, MD  Admit date: 09/22/2016 Discharge date: 09/28/2016  Time spent: 30 minutes    LEFT AMA  Recommendations for Outpatient Follow-up:  1. Patient left AMA   Discharge Diagnoses:  Principal Problem:   Pneumothorax Active Problems:   Chronic hepatitis C (HCC)   IVDU (intravenous drug user)   Endocarditis of tricuspid valve   Cirrhosis of liver with ascites (HCC)   Acute on chronic renal failure (HCC)   Bullous emphysema (HCC)   Discharge Condition: LEFT AMA  Diet recommendation: left AMA  Filed Weights   09/26/16 0430 09/27/16 0109 09/28/16 0521  Weight: 62.7 kg (138 lb 4.8 oz) 61.9 kg (136 lb 8 oz) 62.1 kg (137 lb)    History of present illness:  Per Dr Isaiah Serge 46 year old male who presented to [facility] ER on 9/16 with complaints of ongoing shortness of breath and right-sided chest pain.  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  There were initial concerns for potential meningitis however CSF cultures were negative. Repeat blood cultures prior to discharge were negative. Patient was identified to be hepatitis C and hepatitis B positive (felt not to be a candidate for treatment due to drug abuse). Hospital course was further complicated by left upper extremity radial vein DVT (PE negative, decision made not to use systemic anticoagulation given high risk for bleeding/prolonged chest tube), AKI, metabolic encephalopathy, protein calorie malnutrition and anemia.  The patient's family reported that he had not felt well since discharge. He has had persistent shortness of breath and right-sided chest pain. The patient has had poor PO intake and almost daily nausea and  vomiting (to the point that his 6 year old son brings him a popsicle and a trash can every morning). He reports a continued sensation of something stuck in the back of his throat, increased cough but no significant sputum production. Family also reports that he fell down the stairs striking his back. The patient filled discharge medications and has been taking them since discharge. He denies IV drug use but states he has used pain tablet on a couple of occasions due to chest pain (family reports he is pretty candidate regarding his IV drug abuse).    Initial ER evaluation found him to be mildly hypotensive.  CXR revealed a moderate right sided pneumothorax.    PCCM consulted for Pneumothorax.     Hospital Course:   Patient left AMA 1. Right pneumothorax, suspected spontaneous, emphysematous bullous disease. Am chest film patient was admitted with a right pneumothorax. History emphysematous bullous disease. Patient was on the critical care service and had chest tube placed. Patient was monitored. As patient improved clinically and was followed with x-rays chest tube was subsequently clamped. Repeat chest x-ray done showed resolution of pneumothorax. Chest tube was subsequently removed on 09/28/2016. Patient was supposed to have repeat chest x-rays done post removal of chest tube and in the morning however patient   left AGAINST MEDICAL ADVICE.   2. Acute kidney injury on stage 3 CKD, due to dehydration, complicated with hyperkalemia.  Renal function improved daily with hydration. Diuretics and nephrotoxic agents were held throughout the hospitalization.   3. Hyperglycemia. Patient with poor oral intake.   4. History of IV drug abuse.No clinical signs of withdrawal. Follow.  5. Hepatic  cirrhosis, due to viral hepatitis. C and B.  Patient currently asymptomatic. Anicteric. No ascites or encephalopathy. Due to patient's ongoing IV drug use currently not a candidate for treatment. Patient  maintained on Pepcid for GI prophylaxis. Outpatient follow-up with ID.  6. Hyperkalemia Patient was intermittently refusing repeat blood draws although he was hyperkalemic. Patient also refused Kayexalate. Importance of decreasing his hyperkalemia was expressive patient and he finally agreed and took a dose of Kayexalate one day prior to discharge. Patient's hyperkalemia had resolved by day of discharge. Patient however did leave AMA.     Procedures: CT Chest 9/16 >>1. Severe upper lobe predominant centrilobular, paraseptal and bullous emphysematous disease of both lungs with right-sided pneumothorax estimated at 30%. A right-sided chest tube is seen within the pneumothorax. No tension noted. 2. 2.2 x 1.8 x 1.7 cm partially opacified right upper lobe bulla possibly stigmata of old prior pneumonic consolidation and residual debris within the bulla. A pulmonary mass is believed less likely. There has been marked clearing of previously noted pneumonic consolidations involving the right lung as well as clearing of bilateral pleural effusions. 3. Thoracic ascending aortic aneurysm at 4.2 cm in caliber.   Consultations:  Admitted to PCCM.  Discharge Exam: Vitals:   09/27/16 2136 09/28/16 0521  BP: 106/67 105/69  Pulse: 77 70  Resp: 18 16  Temp: 98.2 F (36.8 C) 98.2 F (36.8 C)  SpO2: 100% 100%    General:  Patient left AMA Cardiovascular:  Respiratory:   Discharge Instructions    Current Discharge Medication List    CONTINUE these medications which have NOT CHANGED   Details  folic acid (FOLVITE) 1 MG tablet Take 1 tablet (1 mg total) by mouth daily. Qty: 30 tablet, Refills: 1    furosemide (LASIX) 20 MG tablet Take 2.5 tablets (50 mg total) by mouth 2 (two) times daily. Qty: 150 tablet, Refills: 1    hydrALAZINE (APRESOLINE) 50 MG tablet Take 1 tablet (50 mg total) by mouth every 8 (eight) hours. Qty: 90 tablet, Refills: 1    magnesium oxide (MAG-OX) 400  (241.3 Mg) MG tablet Take 1 tablet (400 mg total) by mouth 2 (two) times daily. Qty: 60 tablet, Refills: 1    spironolactone (ALDACTONE) 100 MG tablet Take 100 mg by mouth 2 (two) times daily. Refills: 1    acetaminophen (TYLENOL) 325 MG tablet Take 2 tablets (650 mg total) by mouth every 6 (six) hours as needed for mild pain, fever or headache. Qty: 40 tablet, Refills: 0    feeding supplement (BOOST / RESOURCE BREEZE) LIQD Take 1 Container by mouth 2 (two) times daily between meals. Qty: 8000 mL, Refills: 3    multivitamin (PROSIGHT) TABS tablet Take 1 tablet by mouth daily. Qty: 30 each, Refills: 0    nicotine (NICODERM CQ - DOSED IN MG/24 HOURS) 21 mg/24hr patch Place 1 patch (21 mg total) onto the skin daily. Qty: 30 patch, Refills: 1    pantoprazole (PROTONIX) 40 MG tablet Take 1 tablet (40 mg total) by mouth daily.       Allergies  Allergen Reactions  . Ativan [Lorazepam]     Paradoxical Reaction      The results of significant diagnostics from this hospitalization (including imaging, microbiology, ancillary and laboratory) are listed below for reference.    Significant Diagnostic Studies: Dg Chest 2 View  Result Date: 09/26/2016 CLINICAL DATA:  46 year old male with recurrent pneumothorax, right chest tube remains in place. Shortness of breath for 1 week. Smoker.  EXAM: CHEST  2 VIEW COMPARISON:  09/25/2016 and earlier. FINDINGS: Seated upright AP and lateral views of the chest. Stable right lateral approach pigtail type chest tube. Skin fold artifact on the frontal view between the posterior fourth and fifth ribs. No pneumothorax identified. Emphysema. Mediastinal contours remain within normal limits. Visualized tracheal air column is within normal limits. No pulmonary edema. Blunting of both posterior costophrenic angle suggesting small pleural effusions. Mild patchy opacity along the course of the right chest tube. No other acute pulmonary opacity. No acute osseous  abnormality identified. Negative visible bowel gas pattern. IMPRESSION: 1. Stable right chest tube with no residual pneumothorax. 2. Small bilateral pleural effusions are new since 09/22/2016. 3. Emphysema. Electronically Signed   By: Odessa Fleming M.D.   On: 09/26/2016 15:18   Dg Chest 2 View  Result Date: 09/22/2016 CLINICAL DATA:  Shortness of breath. EXAM: CHEST  2 VIEW COMPARISON:  09/01/2016 FINDINGS: Normal heart size. No pleural effusion or edema. No airspace opacities. Advanced changes of emphysema identified. There is a moderate right-sided pneumothorax measuring 4 cm in thickness. The visualized osseous structures are unremarkable. IMPRESSION: Moderate volume right-sided pneumothorax Emphysema (ICD10-J43.9). Electronically Signed   By: Signa Kell M.D.   On: 09/22/2016 15:43   Dg Chest 2 View  Result Date: 09/03/2016 CLINICAL DATA:  Status post right chest tube removal EXAM: CHEST  2 VIEW COMPARISON:  09/01/2016 FINDINGS: Cardiac shadow is stable. The right chest tube has been removed in the interval. Some minimal atelectatic changes are noted along the minor fissure. Right PICC line is again seen and stable. No significant pneumothorax is seen on the right. Minimal atelectatic changes are noted in the left base. Patchy infiltrate is noted in the right lower lobe best seen on the lateral exam. IMPRESSION: No pneumothorax following chest tube removal. Right lower lobe infiltrate. Electronically Signed   By: Alcide Clever M.D.   On: 09/03/2016 08:16   Dg Chest 2 View  Result Date: 09/01/2016 CLINICAL DATA:  Chest tube placement, continued surveillance. EXAM: CHEST  2 VIEW COMPARISON:  08/28/2016 FINDINGS: RIGHT chest tube good position. Less than 5% RIGHT pneumothorax redemonstrated, similar to priors. Unchanged PICC line, tip in RIGHT atrium. BILATERAL pulmonary opacities without significant consolidation or edema. Small RIGHT effusion may be increased. Normal cardiomediastinal silhouette.  IMPRESSION: RIGHT chest tube good position. Less than 5% RIGHT apical pneumothorax can still be visualized. Electronically Signed   By: Elsie Stain M.D.   On: 09/01/2016 15:17   Ct Chest Wo Contrast  Result Date: 09/22/2016 CLINICAL DATA:  Recurrent pneumothorax. Patient has been dyspneic for several days accompanied by vague abdominal discomfort. Low-grade fevers 4 days ago and yesterday. Patient fell yesterday injuring lower back. EXAM: CT CHEST WITHOUT CONTRAST TECHNIQUE: Multidetector CT imaging of the chest was performed following the standard protocol without IV contrast. COMPARISON:  08/10/2016 FINDINGS: Cardiovascular: Heart is normal in size without pericardial effusion or thickening. Minimal LAD coronary arteriosclerosis. Mild dilatation of the un opacified main pulmonary artery consistent with chronic pulmonary hypertension up to 3.1 cm in caliber. 4.2 cm ascending thoracic aortic aneurysm. No aortic arch or descending thoracic aortic aneurysm. Mediastinum/Nodes: Limited by lack of IV contrast. No mediastinal adenopathy. The hila is limited in assessment due to the noncontrast study. The esophagus, trachea mainstem bronchi are unremarkable. Lungs/Pleura: Upper lobe predominant bullous, paraseptal and centrilobular emphysema is noted bilaterally with an approximately 30% right apical pneumothorax with chest tube in place. Pneumothorax is also seen along the right  lung base with thinly septated paraseptal emphysematous changes along the periphery of the right middle and lower lobes. Partially opacified right upper lobe bulla measuring 2.2 x 1.8 x 1.7 cm is noted possibly residual from previous pneumonia. Suggest attention on follow-up. No significant pleural effusion. Upper Abdomen: Mild stable splenomegaly. No adrenal masses. The unenhanced liver, included kidneys and gallbladder are nonacute. Musculoskeletal: No chest wall mass or suspicious bone lesions identified. Subcutaneous emphysema secondary  to chest tube placement along the right hemithorax. IMPRESSION: 1. Severe upper lobe predominant centrilobular, paraseptal and bullous emphysematous disease of both lungs with right-sided pneumothorax estimated at 30%. A right-sided chest tube is seen within the pneumothorax. No tension noted. 2. 2.2 x 1.8 x 1.7 cm partially opacified right upper lobe bulla possibly stigmata of old prior pneumonic consolidation and residual debris within the bulla. A pulmonary mass is believed less likely. There has been marked clearing of previously noted pneumonic consolidations involving the right lung as well as clearing of bilateral pleural effusions. 3. Thoracic ascending aortic aneurysm at 4.2 cm in caliber. Recommend annual imaging followup by CTA or MRA. This recommendation follows 2010 ACCF/AHA/AATS/ACR/ASA/SCA/SCAI/SIR/STS/SVM Guidelines for the Diagnosis and Management of Patients with Thoracic Aortic Disease. Circulation. 2010; 121: Z610-R604 4. Mild splenomegaly. Aortic Atherosclerosis (ICD10-I70.0) and Emphysema (ICD10-J43.9). Electronically Signed   By: Tollie Eth M.D.   On: 09/22/2016 20:57   Dg Chest Port 1 View  Result Date: 09/28/2016 CLINICAL DATA:  Follow-up pneumothorax EXAM: PORTABLE CHEST 1 VIEW COMPARISON:  09/27/2016 FINDINGS: Right-sided chest tube is again identified in satisfactory position. No pneumothorax is seen. Mild chronic changes are noted in the lungs bilaterally. Cardiac shadow is stable. No acute bony abnormality is seen. IMPRESSION: A pneumothorax is noted. Electronically Signed   By: Alcide Clever M.D.   On: 09/28/2016 08:58   Dg Chest Portable 1 View  Result Date: 09/27/2016 CLINICAL DATA:  Pneumothorax EXAM: PORTABLE CHEST 1 VIEW COMPARISON:  09/26/2016 FINDINGS: Stable right chest tube. No pneumothorax. Emphysema in the right axillary soft tissues. Blebs at both lung apices are stable. Patchy density in the lateral mid right lung stable. Normal heart size. IMPRESSION: Stable.  No  right pneumothorax. Electronically Signed   By: Jolaine Click M.D.   On: 09/27/2016 07:43   Dg Chest Port 1 View  Result Date: 09/25/2016 CLINICAL DATA:  Respiratory failure.  Shortness of breath. EXAM: PORTABLE CHEST 1 VIEW COMPARISON:  09/23/2016 FINDINGS: Shallow inspiration. Heart size and pulmonary vascularity are normal. Emphysematous changes in the upper lungs. Right chest tube remains unchanged in position. Tiny residual right pneumothorax is smaller than on previous study. Small amount of subcutaneous emphysema in the right chest wall. Right rib fractures. Left lung is clear. No blunting of costophrenic angles. IMPRESSION: Minimal residual right pneumothorax with right chest tube in place. Subcutaneous emphysema in the right chest wall. Right rib fractures. Emphysematous changes in the lungs. Electronically Signed   By: Burman Nieves M.D.   On: 09/25/2016 02:13   Dg Chest Portable 1 View  Result Date: 09/23/2016 CLINICAL DATA:  Pneumothorax. EXAM: PORTABLE CHEST 1 VIEW COMPARISON:  Radiograph of September 23, 2016. FINDINGS: The heart size and mediastinal contours are within normal limits. Emphysematous bulla formation is noted in the left upper lobe. Minimal left basilar scarring or subsegmental atelectasis is noted. Right-sided chest tube is unchanged in position. Mild right apical pneumothorax is noted which is slightly enlarged compared to prior exam. Stable subcutaneous emphysema is seen over right lateral chest wall. The  visualized skeletal structures are unremarkable. IMPRESSION: Emphysematous bulla formation is noted in left upper lobe. Right-sided chest tube is unchanged in position. Mild right apical pneumothorax is noted which is slightly enlarged compared to prior exam. Electronically Signed   By: Lupita Raider, M.D.   On: 09/23/2016 11:47   Dg Chest Port 1 View  Result Date: 09/23/2016 CLINICAL DATA:  Pneumothorax EXAM: PORTABLE CHEST 1 VIEW COMPARISON:  09/22/2016 FINDINGS:  Right chest tube remains in place. Improving right pneumothorax. Possible tiny residual right apical pneumothorax and right lateral basilar pneumothorax. No confluent airspace opacities. Heart is normal size. Subcutaneous emphysema noted in the right chest wall, stable. IMPRESSION: Decreasing right pneumothorax with suspected tiny residual right pneumothorax. Electronically Signed   By: Charlett Nose M.D.   On: 09/23/2016 07:44   Dg Chest Port 1 View  Result Date: 09/22/2016 CLINICAL DATA:  Evaluate right pneumothorax following right thoracostomy tube placement. EXAM: PORTABLE CHEST 1 VIEW COMPARISON:  09/22/2016 and prior radiographs FINDINGS: There has been interval placement of a right thoracostomy catheter. Right pneumothorax has decreased with small lateral basilar component remaining. The cardiomediastinal silhouette is unremarkable. The left lung is clear. Emphysema changes again identified. IMPRESSION: Right thoracostomy catheter placement with decrease in right pneumothorax, now small and primarily in the lateral basilar region. Emphysema (ICD10-J43.9). Electronically Signed   By: Harmon Pier M.D.   On: 09/22/2016 18:59    Microbiology: Recent Results (from the past 240 hour(s))  Blood Culture (routine x 2)     Status: None   Collection Time: 09/22/16  3:52 PM  Result Value Ref Range Status   Specimen Description BLOOD RIGHT ANTECUBITAL  Final   Special Requests IN PEDIATRIC BOTTLE Blood Culture adequate volume  Final   Culture NO GROWTH 5 DAYS  Final   Report Status 09/27/2016 FINAL  Final  Blood Culture (routine x 2)     Status: None   Collection Time: 09/22/16  3:58 PM  Result Value Ref Range Status   Specimen Description BLOOD LEFT ANTECUBITAL  Final   Special Requests IN PEDIATRIC BOTTLE Blood Culture adequate volume  Final   Culture NO GROWTH 5 DAYS  Final   Report Status 09/27/2016 FINAL  Final     Labs: Basic Metabolic Panel:  Recent Labs Lab 09/23/16 0313   09/25/16 1120 09/26/16 0302 09/27/16 0319 09/27/16 1623 09/28/16 0400  NA 133*  < > 132* 131* 132* 134* 134*  K 6.1*  < > 5.5* 5.5* 5.6* 5.5* 4.3  CL 107  < > 106 105 104 106 103  CO2 18*  < > 17* 21* GLUCOSE 113*  < > 100* 74 127* 110* 102*  BUN 54*  < > 36* 35* 37* 36* 32*  CREATININE 3.72*  < > 2.72* 2.35* 2.29* 2.26* 1.95*  CALCIUM 8.2*  < > 8.1* 8.1* 8.4* 8.9 8.0*  MG 1.8  --   --   --   --   --   --   PHOS 4.7*  --   --   --   --   --   --   < > = values in this interval not displayed. Liver Function Tests:  Recent Labs Lab 09/22/16 1428  AST 30  ALT 25  ALKPHOS 176*  BILITOT 0.5  PROT 7.5  ALBUMIN 2.8*   No results for input(s): LIPASE, AMYLASE in the last 168 hours. No results for input(s): AMMONIA in the last 168 hours. CBC:  Recent  Labs Lab 09/22/16 1428 09/23/16 0313 09/24/16 0421 09/27/16 0319 09/28/16 0400  WBC 12.1* 8.4 6.8 6.6 6.4  NEUTROABS  --   --  3.3 3.4 3.4  HGB 10.3* 8.6* 8.0* 8.7* 8.0*  HCT 31.5* 25.8* 24.6* 26.9* 24.5*  MCV 84.7 83.8 85.7 85.9 85.7  PLT 195 179 199 209 189   Cardiac Enzymes: No results for input(s): CKTOTAL, CKMB, CKMBINDEX, TROPONINI in the last 168 hours. BNP: BNP (last 3 results) No results for input(s): BNP in the last 8760 hours.  ProBNP (last 3 results) No results for input(s): PROBNP in the last 8760 hours.  CBG: No results for input(s): GLUCAP in the last 168 hours.     SignedRamiro Harvest MD.  [facility] 09/28/2016, 12:06 PM

## 2016-09-28 NOTE — Significant Event (Signed)
Right wayne chest remove without problem. CxR ordered for 1400 and in am.  Recent Labs Lab 09/27/16 0319 09/27/16 1623 09/28/16 0400  NA 132* 134* 134*  K 5.6* 5.5* 4.3  CL 104 106 103  CO2 BUN 37* 36* 32*  CREATININE 2.29* 2.26* 1.95*  GLUCOSE 127* 110* 102*    Recent Labs Lab 09/24/16 0421 09/27/16 0319 09/28/16 0400  HGB 8.0* 8.7* 8.0*  HCT 24.6* 26.9* 24.5*  WBC 6.8 6.6 6.4  PLT 199 209 189    Dg Chest 2 View  Result Date: 09/26/2016 CLINICAL DATA:  46 year old male with recurrent pneumothorax, right chest tube remains in place. Shortness of breath for 1 week. Smoker. EXAM: CHEST  2 VIEW COMPARISON:  09/25/2016 and earlier. FINDINGS: Seated upright AP and lateral views of the chest. Stable right lateral approach pigtail type chest tube. Skin fold artifact on the frontal view between the posterior fourth and fifth ribs. No pneumothorax identified. Emphysema. Mediastinal contours remain within normal limits. Visualized tracheal air column is within normal limits. No pulmonary edema. Blunting of both posterior costophrenic angle suggesting small pleural effusions. Mild patchy opacity along the course of the right chest tube. No other acute pulmonary opacity. No acute osseous abnormality identified. Negative visible bowel gas pattern. IMPRESSION: 1. Stable right chest tube with no residual pneumothorax. 2. Small bilateral pleural effusions are new since 09/22/2016. 3. Emphysema. Electronically Signed   By: Odessa Fleming M.D.   On: 09/26/2016 15:18   Dg Chest Port 1 View  Result Date: 09/28/2016 CLINICAL DATA:  Follow-up pneumothorax EXAM: PORTABLE CHEST 1 VIEW COMPARISON:  09/27/2016 FINDINGS: Right-sided chest tube is again identified in satisfactory position. No pneumothorax is seen. Mild chronic changes are noted in the lungs bilaterally. Cardiac shadow is stable. No acute bony abnormality is seen. IMPRESSION: A pneumothorax is noted. Electronically Signed   By: Alcide Clever  M.D.   On: 09/28/2016 08:58   Dg Chest Portable 1 View  Result Date: 09/27/2016 CLINICAL DATA:  Pneumothorax EXAM: PORTABLE CHEST 1 VIEW COMPARISON:  09/26/2016 FINDINGS: Stable right chest tube. No pneumothorax. Emphysema in the right axillary soft tissues. Blebs at both lung apices are stable. Patchy density in the lateral mid right lung stable. Normal heart size. IMPRESSION: Stable.  No right pneumothorax. Electronically Signed   By: Jolaine Click M.D.   On: 09/27/2016 07:43     BP 105/69 (BP Location: Right Arm)   Pulse 70   Temp 98.2 F (36.8 C) (Oral)   Resp 16   Ht  (1.803 m)   Wt 137 lb (62.1 kg)   SpO2 100%   BMI 19.11 kg/m    Brett Canales Aslee Such ACNP [facility] PCCM Pager [phone] till 3 pm If no answer page [phone] 09/28/2016, 11:55 AM

## 2016-09-28 NOTE — Progress Notes (Signed)
PULMONARY / CRITICAL CARE MEDICINE   Name: Johnathan [name] MRN: [id number] DOB: [birth year]    ADMISSION DATE:  09/22/2016 CONSULTATION DATE:  09/22/16  REFERRING MD:  Dr. Ethelda Chick   CHIEF COMPLAINT:  SOB, right sided chest pain  HISTORY OF PRESENT ILLNESS:   46 year old male who presented to [facility] ER on 9/16 with complaints of ongoing shortness of breath and right-sided chest pain.  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  There were initial concerns for potential meningitis however CSF cultures were negative. Repeat blood cultures prior to discharge were negative. Patient was identified to be hepatitis C and hepatitis B positive (felt not to be a candidate for treatment due to drug abuse). Hospital course was further complicated by left upper extremity radial vein DVT (PE negative, decision made not to use systemic anticoagulation given high risk for bleeding/prolonged chest tube), AKI, metabolic encephalopathy, protein calorie malnutrition and anemia.  The patient's family report that he has not felt well since discharge. He has had persistent shortness of breath and right-sided chest pain. The patient has had poor PO intake and almost daily nausea and vomiting (to the point that his 67 year old son brings him a popsicle and a trash can every morning). He reports a continued sensation of something stuck in the back of his throat, increased cough but no significant sputum production. Family also reports that he fell down the stairs striking his back. The patient filled discharge medications and has been taking them since discharge. He denies IV drug use but states he has used pain tablet on a couple of occasions due to chest pain (family reports he is pretty candidate regarding his IV drug  abuse).    Initial ER evaluation found him to be mildly hypotensive.  CXR revealed a moderate right sided pneumothorax.    PCCM consulted for Pneumothorax.    SUBJECTIVE:  No c/o. Sleepy.   VITAL SIGNS: BP 105/69 (BP Location: Right Arm)   Pulse 70   Temp 98.2 F (36.8 C) (Oral)   Resp 16   Ht  (1.803 m)   Wt 137 lb (62.1 kg)   SpO2 100%   BMI 19.11 kg/m    INTAKE / OUTPUT: I/O last 3 completed shifts: In: 1160 [P.O.:1160] Out: 1125 [Urine:1125]  PHYSICAL EXAMINATION: General: Adult male sleeping in bed  HEENT: MM pink and moist, normocephalic. atraumatic Neuro: drowsy but easily arousable, appropriate CV: s1s2 rrr, no m/r/g PULM: resps even/non-labored on RA, clear, no palpable subq air, R chest tube to water seal, no air leak  WU:JWJX, non-tender, bsx4 active  Extremities: warm/dry, no edema  Skin: warm and dry, no rashes or lesions, multiple tattoos  LABS:  BMET  Recent Labs Lab 09/27/16 0319 09/27/16 1623 09/28/16 0400  NA 132* 134* 134*  K 5.6* 5.5* 4.3  CL 104 106 103  CO2 BUN 37* 36* 32*  CREATININE 2.29* 2.26* 1.95*  GLUCOSE 127* 110* 102*    Electrolytes  Recent Labs Lab 09/23/16 0313  09/27/16 0319 09/27/16 1623 09/28/16 0400  CALCIUM 8.2*  < > 8.4* 8.9 8.0*  MG 1.8  --   --   --   --   PHOS 4.7*  --   --   --   --   < > = values in this interval not displayed.  CBC  Recent Labs Lab 09/24/16 0421 09/27/16 0319 09/28/16 0400  WBC 6.8 6.6 6.4  HGB 8.0* 8.7* 8.0*  HCT 24.6* 26.9* 24.5*  PLT 199 209 189    Coag's No results for input(s): APTT, INR in the last 168 hours.  Sepsis Markers  Recent Labs Lab 09/22/16 1438 09/22/16 1604 09/23/16 0313  LATICACIDVEN 3.02* 2.31* 0.8    ABG No results for input(s): PHART, PCO2ART, PO2ART in the last 168 hours.  Liver Enzymes  Recent Labs Lab 09/22/16 1428  AST 30  ALT 25  ALKPHOS 176*  BILITOT 0.5  ALBUMIN 2.8*    Cardiac Enzymes No results for  input(s): TROPONINI, PROBNP in the last 168 hours.  Glucose No results for input(s): GLUCAP in the last 168 hours.  Imaging Dg Chest Port 1 View  Result Date: 09/28/2016 CLINICAL DATA:  Follow-up pneumothorax EXAM: PORTABLE CHEST 1 VIEW COMPARISON:  09/27/2016 FINDINGS: Right-sided chest tube is again identified in satisfactory position. No pneumothorax is seen. Mild chronic changes are noted in the lungs bilaterally. Cardiac shadow is stable. No acute bony abnormality is seen. IMPRESSION: A pneumothorax is noted. Electronically Signed   By: Alcide Clever M.D.   On: 09/28/2016 08:58     STUDIES:  CT Chest 9/16 >>1. Severe upper lobe predominant centrilobular, paraseptal and bullous emphysematous disease of both lungs with right-sided pneumothorax estimated at 30%. A right-sided chest tube is seen within the pneumothorax. No tension noted. 2. 2.2 x 1.8 x 1.7 cm partially opacified right upper lobe bulla possibly stigmata of old prior pneumonic consolidation and residual debris within the bulla. A pulmonary mass is believed less likely. There has been marked clearing of previously noted pneumonic consolidations involving the right lung as well as clearing of bilateral pleural effusions. 3. Thoracic ascending aortic aneurysm at 4.2 cm in caliber.  CULTURES: BCx2 9/16 >>   ANTIBIOTICS: Vanco 9/16 x1  Zosyn 9/16 x1   SIGNIFICANT EVENTS: 9/16  Admit with pneumothorax  LINES/TUBES: R Chest Tube 9/16 >> to be removed 9/22    ASSESSMENT / PLAN:   Right Pneumothorax - ~30%, now s/p chest tube placement with resolution   Had small degree of re-collapse when CT taken off suction 9/17 ? Chest Wall Defect -- Prior to chest tube placement, patient had silver dollar size rounded area near chest tube that would pop out on exhalation. No obvious chest wall defect noted on CT.  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  On exam, distant BS bilaterally.  I reviewed CXR myself, PTX resolved on the right.  CT in place with no leak and no PTX am 9/22 so ok to d/c R chest tube and f/u cxr and if ok then home with f/u in pulmonary in 1 week   Kandice Robinsons   NP   Sandrea Hughs, MD Pulmonary and Critical Care Medicine [facility] Cell [phone] After 5:30 PM or weekends, use Beeper [phone]

## 2017-08-29 ENCOUNTER — Emergency Department (HOSPITAL_COMMUNITY): Payer: Medicaid Other

## 2017-08-29 ENCOUNTER — Other Ambulatory Visit: Payer: Self-pay

## 2017-08-29 ENCOUNTER — Inpatient Hospital Stay (HOSPITAL_COMMUNITY)
Admission: EM | Admit: 2017-08-29 | Discharge: 2017-09-07 | DRG: 871 | Disposition: E | Payer: Medicaid Other | Attending: Critical Care Medicine | Admitting: Critical Care Medicine

## 2017-08-29 DIAGNOSIS — F17210 Nicotine dependence, cigarettes, uncomplicated: Secondary | ICD-10-CM | POA: Diagnosis present

## 2017-08-29 DIAGNOSIS — J9601 Acute respiratory failure with hypoxia: Secondary | ICD-10-CM | POA: Diagnosis not present

## 2017-08-29 DIAGNOSIS — Z452 Encounter for adjustment and management of vascular access device: Secondary | ICD-10-CM

## 2017-08-29 DIAGNOSIS — Z66 Do not resuscitate: Secondary | ICD-10-CM | POA: Diagnosis present

## 2017-08-29 DIAGNOSIS — I4510 Unspecified right bundle-branch block: Secondary | ICD-10-CM | POA: Diagnosis present

## 2017-08-29 DIAGNOSIS — N189 Chronic kidney disease, unspecified: Secondary | ICD-10-CM | POA: Diagnosis present

## 2017-08-29 DIAGNOSIS — R6520 Severe sepsis without septic shock: Secondary | ICD-10-CM

## 2017-08-29 DIAGNOSIS — E874 Mixed disorder of acid-base balance: Secondary | ICD-10-CM | POA: Diagnosis present

## 2017-08-29 DIAGNOSIS — R579 Shock, unspecified: Secondary | ICD-10-CM

## 2017-08-29 DIAGNOSIS — I330 Acute and subacute infective endocarditis: Secondary | ICD-10-CM | POA: Diagnosis present

## 2017-08-29 DIAGNOSIS — J439 Emphysema, unspecified: Secondary | ICD-10-CM | POA: Diagnosis present

## 2017-08-29 DIAGNOSIS — B9562 Methicillin resistant Staphylococcus aureus infection as the cause of diseases classified elsewhere: Secondary | ICD-10-CM | POA: Diagnosis not present

## 2017-08-29 DIAGNOSIS — J939 Pneumothorax, unspecified: Secondary | ICD-10-CM | POA: Diagnosis not present

## 2017-08-29 DIAGNOSIS — G9340 Encephalopathy, unspecified: Secondary | ICD-10-CM | POA: Diagnosis not present

## 2017-08-29 DIAGNOSIS — D631 Anemia in chronic kidney disease: Secondary | ICD-10-CM | POA: Diagnosis present

## 2017-08-29 DIAGNOSIS — R7881 Bacteremia: Secondary | ICD-10-CM | POA: Diagnosis not present

## 2017-08-29 DIAGNOSIS — F1110 Opioid abuse, uncomplicated: Secondary | ICD-10-CM | POA: Diagnosis present

## 2017-08-29 DIAGNOSIS — I2690 Septic pulmonary embolism without acute cor pulmonale: Secondary | ICD-10-CM | POA: Diagnosis not present

## 2017-08-29 DIAGNOSIS — A4102 Sepsis due to Methicillin resistant Staphylococcus aureus: Principal | ICD-10-CM | POA: Diagnosis present

## 2017-08-29 DIAGNOSIS — F40240 Claustrophobia: Secondary | ICD-10-CM | POA: Diagnosis present

## 2017-08-29 DIAGNOSIS — Z8679 Personal history of other diseases of the circulatory system: Secondary | ICD-10-CM | POA: Diagnosis not present

## 2017-08-29 DIAGNOSIS — Z79899 Other long term (current) drug therapy: Secondary | ICD-10-CM

## 2017-08-29 DIAGNOSIS — Z9911 Dependence on respirator [ventilator] status: Secondary | ICD-10-CM | POA: Diagnosis not present

## 2017-08-29 DIAGNOSIS — A419 Sepsis, unspecified organism: Secondary | ICD-10-CM | POA: Diagnosis not present

## 2017-08-29 DIAGNOSIS — D696 Thrombocytopenia, unspecified: Secondary | ICD-10-CM | POA: Diagnosis present

## 2017-08-29 DIAGNOSIS — B182 Chronic viral hepatitis C: Secondary | ICD-10-CM | POA: Diagnosis present

## 2017-08-29 DIAGNOSIS — Z9689 Presence of other specified functional implants: Secondary | ICD-10-CM

## 2017-08-29 DIAGNOSIS — Z978 Presence of other specified devices: Secondary | ICD-10-CM | POA: Diagnosis not present

## 2017-08-29 DIAGNOSIS — B181 Chronic viral hepatitis B without delta-agent: Secondary | ICD-10-CM | POA: Diagnosis present

## 2017-08-29 DIAGNOSIS — Z888 Allergy status to other drugs, medicaments and biological substances status: Secondary | ICD-10-CM

## 2017-08-29 DIAGNOSIS — E162 Hypoglycemia, unspecified: Secondary | ICD-10-CM | POA: Diagnosis present

## 2017-08-29 DIAGNOSIS — J189 Pneumonia, unspecified organism: Secondary | ICD-10-CM | POA: Diagnosis present

## 2017-08-29 DIAGNOSIS — E872 Acidosis, unspecified: Secondary | ICD-10-CM

## 2017-08-29 DIAGNOSIS — Z9289 Personal history of other medical treatment: Secondary | ICD-10-CM

## 2017-08-29 DIAGNOSIS — F1910 Other psychoactive substance abuse, uncomplicated: Secondary | ICD-10-CM | POA: Diagnosis present

## 2017-08-29 DIAGNOSIS — Z885 Allergy status to narcotic agent status: Secondary | ICD-10-CM | POA: Diagnosis not present

## 2017-08-29 DIAGNOSIS — J188 Other pneumonia, unspecified organism: Secondary | ICD-10-CM | POA: Diagnosis present

## 2017-08-29 DIAGNOSIS — N183 Chronic kidney disease, stage 3 (moderate): Secondary | ICD-10-CM | POA: Diagnosis not present

## 2017-08-29 DIAGNOSIS — R6521 Severe sepsis with septic shock: Secondary | ICD-10-CM | POA: Diagnosis present

## 2017-08-29 DIAGNOSIS — Z7189 Other specified counseling: Secondary | ICD-10-CM

## 2017-08-29 DIAGNOSIS — N179 Acute kidney failure, unspecified: Secondary | ICD-10-CM | POA: Diagnosis present

## 2017-08-29 DIAGNOSIS — J930 Spontaneous tension pneumothorax: Secondary | ICD-10-CM | POA: Diagnosis not present

## 2017-08-29 DIAGNOSIS — K7460 Unspecified cirrhosis of liver: Secondary | ICD-10-CM | POA: Diagnosis present

## 2017-08-29 DIAGNOSIS — I76 Septic arterial embolism: Secondary | ICD-10-CM | POA: Diagnosis present

## 2017-08-29 DIAGNOSIS — I38 Endocarditis, valve unspecified: Secondary | ICD-10-CM | POA: Diagnosis not present

## 2017-08-29 DIAGNOSIS — Z01818 Encounter for other preprocedural examination: Secondary | ICD-10-CM

## 2017-08-29 DIAGNOSIS — L818 Other specified disorders of pigmentation: Secondary | ICD-10-CM | POA: Diagnosis not present

## 2017-08-29 DIAGNOSIS — Z8619 Personal history of other infectious and parasitic diseases: Secondary | ICD-10-CM | POA: Diagnosis not present

## 2017-08-29 DIAGNOSIS — F1990 Other psychoactive substance use, unspecified, uncomplicated: Secondary | ICD-10-CM | POA: Diagnosis not present

## 2017-08-29 LAB — URINALYSIS, ROUTINE W REFLEX MICROSCOPIC
BILIRUBIN URINE: NEGATIVE
Glucose, UA: NEGATIVE mg/dL
KETONES UR: NEGATIVE mg/dL
Leukocytes, UA: NEGATIVE
Nitrite: NEGATIVE
PH: 5 (ref 5.0–8.0)
Protein, ur: 30 mg/dL — AB
Specific Gravity, Urine: 1.034 — ABNORMAL HIGH (ref 1.005–1.030)

## 2017-08-29 LAB — CBC WITH DIFFERENTIAL/PLATELET
BASOS ABS: 0 10*3/uL (ref 0.0–0.1)
Basophils Relative: 0 %
EOS ABS: 0.1 10*3/uL (ref 0.0–0.7)
Eosinophils Relative: 1 %
HCT: 45 % (ref 39.0–52.0)
Hemoglobin: 14.1 g/dL (ref 13.0–17.0)
LYMPHS ABS: 0.4 10*3/uL — AB (ref 0.7–4.0)
Lymphocytes Relative: 5 %
MCH: 25.1 pg — ABNORMAL LOW (ref 26.0–34.0)
MCHC: 31.3 g/dL (ref 30.0–36.0)
MCV: 80.2 fL (ref 78.0–100.0)
MONO ABS: 0.2 10*3/uL (ref 0.1–1.0)
Monocytes Relative: 3 %
Neutro Abs: 7.4 10*3/uL (ref 1.7–7.7)
Neutrophils Relative %: 91 %
Platelets: 77 10*3/uL — ABNORMAL LOW (ref 150–400)
RBC: 5.61 MIL/uL (ref 4.22–5.81)
RDW: 16.4 % — AB (ref 11.5–15.5)
WBC Morphology: INCREASED
WBC: 8.1 10*3/uL (ref 4.0–10.5)

## 2017-08-29 LAB — COMPREHENSIVE METABOLIC PANEL
ALK PHOS: 83 U/L (ref 38–126)
ALT: 21 U/L (ref 0–44)
ANION GAP: 18 — AB (ref 5–15)
AST: 48 U/L — ABNORMAL HIGH (ref 15–41)
Albumin: 2.3 g/dL — ABNORMAL LOW (ref 3.5–5.0)
BUN: 56 mg/dL — ABNORMAL HIGH (ref 6–20)
CALCIUM: 7.7 mg/dL — AB (ref 8.9–10.3)
CHLORIDE: 98 mmol/L (ref 98–111)
CO2: 20 mmol/L — AB (ref 22–32)
CREATININE: 2.77 mg/dL — AB (ref 0.61–1.24)
GFR, EST AFRICAN AMERICAN: 30 mL/min — AB (ref >60)
GFR, EST NON AFRICAN AMERICAN: 26 mL/min — AB (ref >60)
Glucose, Bld: 62 mg/dL — ABNORMAL LOW (ref 70–99)
Potassium: 3.8 mmol/L (ref 3.5–5.1)
SODIUM: 136 mmol/L (ref 135–145)
Total Bilirubin: 2.3 mg/dL — ABNORMAL HIGH (ref 0.3–1.2)
Total Protein: 6.3 g/dL — ABNORMAL LOW (ref 6.5–8.1)

## 2017-08-29 LAB — I-STAT ARTERIAL BLOOD GAS, ED
Acid-base deficit: 4 mmol/L — ABNORMAL HIGH (ref 0.0–2.0)
Bicarbonate: 20.2 mmol/L (ref 20.0–28.0)
O2 SAT: 91 %
PCO2 ART: 34.7 mmHg (ref 32.0–48.0)
TCO2: 21 mmol/L — AB (ref 22–32)
pH, Arterial: 7.375 (ref 7.350–7.450)
pO2, Arterial: 63 mmHg — ABNORMAL LOW (ref 83.0–108.0)

## 2017-08-29 LAB — GLUCOSE, CAPILLARY
GLUCOSE-CAPILLARY: 57 mg/dL — AB (ref 70–99)
GLUCOSE-CAPILLARY: 100 mg/dL — AB (ref 70–99)

## 2017-08-29 LAB — POCT I-STAT 3, ART BLOOD GAS (G3+)
Acid-base deficit: 4 mmol/L — ABNORMAL HIGH (ref 0.0–2.0)
BICARBONATE: 19.3 mmol/L — AB (ref 20.0–28.0)
O2 Saturation: 96 %
TCO2: 20 mmol/L — ABNORMAL LOW (ref 22–32)
pCO2 arterial: 30.4 mmHg — ABNORMAL LOW (ref 32.0–48.0)
pH, Arterial: 7.411 (ref 7.350–7.450)
pO2, Arterial: 77 mmHg — ABNORMAL LOW (ref 83.0–108.0)

## 2017-08-29 LAB — I-STAT CG4 LACTIC ACID, ED
LACTIC ACID, VENOUS: 7.21 mmol/L — AB (ref 0.5–1.9)
Lactic Acid, Venous: 6.42 mmol/L (ref 0.5–1.9)

## 2017-08-29 LAB — RAPID URINE DRUG SCREEN, HOSP PERFORMED
Amphetamines: NOT DETECTED
BARBITURATES: NOT DETECTED
Benzodiazepines: NOT DETECTED
COCAINE: NOT DETECTED
OPIATES: POSITIVE — AB
Tetrahydrocannabinol: NOT DETECTED

## 2017-08-29 LAB — CREATININE, URINE, RANDOM: Creatinine, Urine: 128.8 mg/dL

## 2017-08-29 LAB — I-STAT TROPONIN, ED: TROPONIN I, POC: 0.11 ng/mL — AB (ref 0.00–0.08)

## 2017-08-29 LAB — SODIUM, URINE, RANDOM: Sodium, Ur: 19 mmol/L

## 2017-08-29 MED ORDER — SODIUM CHLORIDE 0.9 % IV BOLUS
1000.0000 mL | Freq: Once | INTRAVENOUS | Status: AC
  Administered 2017-08-29: 1000 mL via INTRAVENOUS

## 2017-08-29 MED ORDER — VANCOMYCIN HCL 10 G IV SOLR
1500.0000 mg | Freq: Once | INTRAVENOUS | Status: AC
  Administered 2017-08-29: 1500 mg via INTRAVENOUS
  Filled 2017-08-29: qty 1500

## 2017-08-29 MED ORDER — METHYLPREDNISOLONE SODIUM SUCC 125 MG IJ SOLR
60.0000 mg | Freq: Four times a day (QID) | INTRAMUSCULAR | Status: DC
  Administered 2017-08-29 – 2017-08-30 (×2): 60 mg via INTRAVENOUS
  Filled 2017-08-29 (×2): qty 2

## 2017-08-29 MED ORDER — SODIUM CHLORIDE 0.9 % IV SOLN: 2.0000 g | INTRAVENOUS | Status: DC

## 2017-08-29 MED ORDER — DEXMEDETOMIDINE HCL IN NACL 400 MCG/100ML IV SOLN
0.1000 ug/kg/h | INTRAVENOUS | Status: DC
  Administered 2017-08-29: 0.2 ug/kg/h via INTRAVENOUS
  Administered 2017-08-30 (×2): 1.401 ug/kg/h via INTRAVENOUS
  Administered 2017-08-30: 1.4 ug/kg/h via INTRAVENOUS
  Administered 2017-08-30: 1.401 ug/kg/h via INTRAVENOUS
  Administered 2017-08-30 – 2017-08-31 (×8): 1.4 ug/kg/h via INTRAVENOUS
  Administered 2017-09-01: 0.5 ug/kg/h via INTRAVENOUS
  Filled 2017-08-29 (×16): qty 100

## 2017-08-29 MED ORDER — SODIUM CHLORIDE 0.9 % IV BOLUS (SEPSIS)
1000.0000 mL | Freq: Once | INTRAVENOUS | Status: AC
  Administered 2017-08-29: 1000 mL via INTRAVENOUS

## 2017-08-29 MED ORDER — ARFORMOTEROL TARTRATE 15 MCG/2ML IN NEBU
15.0000 ug | INHALATION_SOLUTION | Freq: Two times a day (BID) | RESPIRATORY_TRACT | Status: DC
  Administered 2017-08-29 – 2017-09-01 (×6): 15 ug via RESPIRATORY_TRACT
  Filled 2017-08-29 (×8): qty 2

## 2017-08-29 MED ORDER — SODIUM CHLORIDE 0.9 % IV SOLN
250.0000 mL | INTRAVENOUS | Status: DC | PRN
  Administered 2017-08-29 – 2017-08-31 (×3): 250 mL via INTRAVENOUS

## 2017-08-29 MED ORDER — IOPAMIDOL (ISOVUE-370) INJECTION 76%
INTRAVENOUS | Status: AC
  Filled 2017-08-29: qty 100

## 2017-08-29 MED ORDER — DEXTROSE 50 % IV SOLN
INTRAVENOUS | Status: AC
  Administered 2017-08-29: 50 mL
  Filled 2017-08-29: qty 50

## 2017-08-29 MED ORDER — METRONIDAZOLE IN NACL 5-0.79 MG/ML-% IV SOLN
500.0000 mg | Freq: Three times a day (TID) | INTRAVENOUS | Status: DC
  Administered 2017-08-29 – 2017-08-30 (×3): 500 mg via INTRAVENOUS
  Filled 2017-08-29 (×3): qty 100

## 2017-08-29 MED ORDER — INSULIN ASPART 100 UNIT/ML ~~LOC~~ SOLN: 1.0000 [IU] | Freq: Four times a day (QID) | SUBCUTANEOUS | Status: DC | PRN

## 2017-08-29 MED ORDER — IPRATROPIUM-ALBUTEROL 0.5-2.5 (3) MG/3ML IN SOLN
RESPIRATORY_TRACT | Status: AC
  Administered 2017-08-29: 3 mL via RESPIRATORY_TRACT
  Filled 2017-08-29: qty 3

## 2017-08-29 MED ORDER — VANCOMYCIN HCL IN DEXTROSE 1-5 GM/200ML-% IV SOLN: 1000.0000 mg | Freq: Once | INTRAVENOUS | Status: DC

## 2017-08-29 MED ORDER — HEPARIN SODIUM (PORCINE) 5000 UNIT/ML IJ SOLN
5000.0000 [IU] | Freq: Three times a day (TID) | INTRAMUSCULAR | Status: DC
  Administered 2017-08-29 – 2017-09-01 (×9): 5000 [IU] via SUBCUTANEOUS
  Filled 2017-08-29 (×9): qty 1

## 2017-08-29 MED ORDER — BUDESONIDE 0.5 MG/2ML IN SUSP
0.5000 mg | Freq: Two times a day (BID) | RESPIRATORY_TRACT | Status: DC
  Administered 2017-08-29 – 2017-09-01 (×6): 0.5 mg via RESPIRATORY_TRACT
  Filled 2017-08-29 (×8): qty 2

## 2017-08-29 MED ORDER — SODIUM CHLORIDE 0.9 % IV SOLN
2.0000 g | Freq: Once | INTRAVENOUS | Status: AC
  Administered 2017-08-29: 2 g via INTRAVENOUS
  Filled 2017-08-29: qty 2

## 2017-08-29 MED ORDER — IPRATROPIUM-ALBUTEROL 0.5-2.5 (3) MG/3ML IN SOLN
3.0000 mL | Freq: Four times a day (QID) | RESPIRATORY_TRACT | Status: DC
  Administered 2017-08-29 – 2017-09-01 (×11): 3 mL via RESPIRATORY_TRACT
  Filled 2017-08-29 (×10): qty 3

## 2017-08-29 MED ORDER — VANCOMYCIN HCL IN DEXTROSE 1-5 GM/200ML-% IV SOLN
1000.0000 mg | INTRAVENOUS | Status: DC
  Administered 2017-08-30 – 2017-08-31 (×2): 1000 mg via INTRAVENOUS
  Filled 2017-08-29 (×2): qty 200

## 2017-08-29 MED ORDER — IOPAMIDOL (ISOVUE-370) INJECTION 76%
100.0000 mL | Freq: Once | INTRAVENOUS | Status: AC | PRN
  Administered 2017-08-29: 100 mL via INTRAVENOUS

## 2017-08-29 MED ORDER — ORAL CARE MOUTH RINSE: 15.0000 mL | Freq: Two times a day (BID) | OROMUCOSAL | Status: DC

## 2017-08-29 NOTE — ED Notes (Signed)
RN unsuccessful when attempting to draw blood. Provider and phelb notified

## 2017-08-29 NOTE — Progress Notes (Signed)
Patient placed on full face mask BIPAP for work of breathing. He could not tolerate it due to claustrophobia. Tried nasal BIPAP but he likewise could not tolerate it either.   He is pulling his nasal cannula off his face, saying it bothers his nose. Tried Non-rebreather but he also took it off, saying he can't breathe with anything touching his face. Have asked RT and RN both to please try face tent. If he cannot tolerate that, we could try a low dose anxiolytic to help him tolerate it. If he fails that, he may require intubation.

## 2017-08-29 NOTE — Progress Notes (Signed)
Face tent placed on pt, as of now the pt is tolerating. RT notified pharmacy for Pulmicort and brovana.

## 2017-08-29 NOTE — H&P (Signed)
PULMONARY / CRITICAL CARE MEDICINE   Name: Johnathan [name] MRN: [id number] DOB: 1970/01/21    ADMISSION DATE:  09-07-17 CONSULTATION DATE:  09-07-2017  REFERRING MD:  Dr. Rush Landmark   CHIEF COMPLAINT:  Sepsis   HISTORY OF PRESENT ILLNESS:   47 year old male with PMH of MSSA Endocarditis/Bacteremia, IV Drug Abuse with Heroin/IV Opana, Hepatitis C with Cirrhosis, CKD, H/O DVT   Presents to ED on 8/23 with fever, chills, and productive cough with brown-like sputum. States he continues to use IV Heroin with most recent 3 days ago. Upon arrival to ED patient is tachycardiac with HR 150-160, Temp 99, RR 30-40, and Hypoxia with oxygen saturation 70-80. LA 7.21, CXR concerning for multifocal pneumonia. CTA negative for PE, however multiple nodular areas of irregular opacification in both lungs concerning for septic emboli. Given 3L Bolus and Cefepime/Vancomycin. Due to progressive hypoxia attempted BiPAP however patient did not tolerate due to claustrophobia. PCCM asked to consult.     PAST MEDICAL HISTORY :  He  has a past medical history of Substance abuse.  PAST SURGICAL HISTORY: He  has a past surgical history that includes Incision and drainage of bilateral forearm abscesses (Bilateral); Knee arthroscopy; and IR THORACENTESIS ASP PLEURAL SPACE W/IMG GUIDE (08/01/2016).  Allergies  Allergen Reactions  . Ativan [Lorazepam]     Paradoxical Reaction    No current facility-administered medications on file prior to encounter.    Current Outpatient Medications on File Prior to Encounter  Medication Sig  . SUBOXONE 8-2 MG FILM Place 2 Film under the tongue daily.  Marland Kitchen acetaminophen (TYLENOL) 325 MG tablet Take 2 tablets (650 mg total) by mouth every 6 (six) hours as needed for mild pain, fever or headache. (Patient not taking: Reported on 09/22/2016)  . feeding supplement (BOOST / RESOURCE BREEZE) LIQD Take 1 Container by mouth 2 (two) times daily between meals. (Patient not taking: Reported on  09/22/2016)  . folic acid (FOLVITE) 1 MG tablet Take 1 tablet (1 mg total) by mouth daily. (Patient not taking: Reported on 09-07-17)  . furosemide (LASIX) 20 MG tablet Take 2.5 tablets (50 mg total) by mouth 2 (two) times daily. (Patient not taking: Reported on 09/07/17)  . hydrALAZINE (APRESOLINE) 50 MG tablet Take 1 tablet (50 mg total) by mouth every 8 (eight) hours. (Patient not taking: Reported on 09-07-17)  . magnesium oxide (MAG-OX) 400 (241.3 Mg) MG tablet Take 1 tablet (400 mg total) by mouth 2 (two) times daily. (Patient not taking: Reported on Sep 07, 2017)  . multivitamin (PROSIGHT) TABS tablet Take 1 tablet by mouth daily. (Patient not taking: Reported on 09/22/2016)  . nicotine (NICODERM CQ - DOSED IN MG/24 HOURS) 21 mg/24hr patch Place 1 patch (21 mg total) onto the skin daily. (Patient not taking: Reported on 09/22/2016)  . pantoprazole (PROTONIX) 40 MG tablet Take 1 tablet (40 mg total) by mouth daily. (Patient not taking: Reported on 09/22/2016)    FAMILY HISTORY:  His family history is not on file.  SOCIAL HISTORY: He  reports that he has been smoking cigarettes. He has a 7.50 pack-year smoking history. He has never used smokeless tobacco. He reports that he has current or past drug history. Drug: Other-see comments. He reports that he does not drink alcohol.  REVIEW OF SYSTEMS:   All negative; except for those that are bolded, which indicate positives.  Constitutional: weight loss, weight gain, night sweats, fevers, chills, fatigue, weakness.  HEENT: headaches, sore throat, sneezing, nasal congestion, post nasal drip, difficulty swallowing,  tooth/dental problems, visual complaints, visual changes, ear aches. Neuro: difficulty with speech, weakness, numbness, ataxia. CV:  chest pain, orthopnea, PND, swelling in lower extremities, dizziness, palpitations, syncope.  Resp: cough, hemoptysis, dyspnea, wheezing. GI: heartburn, indigestion, abdominal pain, nausea, vomiting,  diarrhea, constipation, change in bowel habits, loss of appetite, hematemesis, melena, hematochezia.  GU: dysuria, change in color of urine, urgency or frequency, flank pain, hematuria. MSK: joint pain or swelling, decreased range of motion. Psych: change in mood or affect, depression, anxiety, suicidal ideations, homicidal ideations. Skin: rash, itching, bruising.   SUBJECTIVE:   VITAL SIGNS: BP 134/78   Pulse (!) 149   Temp 99.8 F (37.7 C) (Rectal)   Resp (!) 39   Ht 5\' 9"  (1.753 m)   Wt 77.1 kg Comment: patient reported  SpO2 92%   BMI 25.10 kg/m   HEMODYNAMICS:    VENTILATOR SETTINGS:    INTAKE / OUTPUT: No intake/output data recorded.  PHYSICAL EXAMINATION: General:  Thin Adult male, Diaphoretic  Neuro:  Alert, oriented, follows commands  HEENT:  Dry MM  Cardiovascular:  Tachy, no MRG  Lungs:  Diminished, Rhonchi, Exp Wheeze  Abdomen:  active bowel sounds Musculoskeletal:  -edema  Skin:  Warm, dry   LABS:  BMET Recent Labs  Lab 09/23/2017 1907  NA 136  K 3.8  CL 98  CO2 20*  BUN 56*  CREATININE 2.77*  GLUCOSE 62*    Electrolytes Recent Labs  Lab 2017/09/23 1907  CALCIUM 7.7*    CBC Recent Labs  Lab 09/23/17 1730  WBC 8.1  HGB 14.1  HCT 45.0  PLT 77*    Coag's No results for input(s): APTT, INR in the last 168 hours.  Sepsis Markers Recent Labs  Lab September 23, 2017 1736 Sep 23, 2017 2008  LATICACIDVEN 7.21* 6.42*    ABG Recent Labs  Lab 23-Sep-2017 2106  PHART 7.375  PCO2ART 34.7  PO2ART 63.0*    Liver Enzymes Recent Labs  Lab 09/23/2017 1907  AST 48*  ALT 21  ALKPHOS 83  BILITOT 2.3*  ALBUMIN 2.3*    Cardiac Enzymes No results for input(s): TROPONINI, PROBNP in the last 168 hours.  Glucose No results for input(s): GLUCAP in the last 168 hours.  Imaging Ct Angio Chest Pe W And/or Wo Contrast  Result Date: 09/23/17 CLINICAL DATA:  Diffuse pleuritic chest pain, shortness of breath, tachycardia, hypoxia, tachypnea, sudden  onset yesterday, high clinical pretest probability for pulmonary embolism, history smoking, IV substance abuse EXAM: CT ANGIOGRAPHY CHEST WITH CONTRAST TECHNIQUE: Multidetector CT imaging of the chest was performed using the standard protocol during bolus administration of intravenous contrast. Multiplanar CT image reconstructions and MIPs were obtained to evaluate the vascular anatomy. 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 No aortic dissection. Heart unremarkable. No pericardial effusion. Pulmonary arteries well opacified and patent. No evidence of pulmonary embolism. Mediastinum/Nodes: Retained fluid in distal esophagus. Base of cervical region normal appearance. No thoracic adenopathy. Lungs/Pleura: Dependent atelectasis in the posterior lower lobes bilaterally. Severe emphysematous changes in the upper lobes. Multiple patchy foci of irregular nodular opacification are seen in both lungs, some which demonstrate central cavitation, suspicious for septic emboli. Additional areas of more confluent opacity are seen within the RIGHT upper lobe and LEFT upper lobe consistent with multifocal pneumonia as well. Unable to exclude underlying pulmonary nodules/tumor. No pleural effusion or pneumothorax. Upper Abdomen: Significant splenomegaly. Normal sized lymph nodes at gastrohepatic ligament. Remaining visualized  upper abdomen unremarkable. Musculoskeletal: No acute osseous findings. Review of the MIP images confirms the above findings. IMPRESSION: No evidence pulmonary embolism. Severe emphysematous changes in the upper lobes. Confluent areas of infiltrate are seen in the upper lobes bilaterally consistent with multifocal pneumonia. However, multiple additional nodular areas of irregular opacification are seen in both lungs, some of which contain central cavitation, highly  suspicious for septic emboli. 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 Recommend annual imaging followup by CTA or MRA. This recommendation follows 2010 ACCF/AHA/AATS/ACR/ASA/SCA/SCAI/SIR/STS/SVM Guidelines for the Diagnosis and Management of Patients with Thoracic Aortic Disease. Circulation. 2010; 121: Z610-R604 Findings called to Dr. Rush Landmark on 08/10/2017 at 1855 hrs. Aortic Atherosclerosis (ICD10-I70.0). Aortic aneurysm NOS (ICD10-I71.9). Emphysema (ICD10-J43.9). Electronically Signed   By: Ulyses Southward M.D.   On: 08/25/2017 18:56   Dg Chest Portable 1 View  Result Date: 08/24/2017 CLINICAL DATA:  Chest pain EXAM: PORTABLE CHEST 1 VIEW COMPARISON:  09/28/2016, 09/27/2016, 09/26/2016, CT chest 09/22/2016 FINDINGS: Hyperinflation with emphysematous disease and bullous change in the apices. Increased interstitial opacity at the bases. Multifocal opacities within the bilateral lungs. Somewhat nodular configuration in the left lower lung. Stable cardiomediastinal silhouette. No pneumothorax. IMPRESSION: Hyperinflation with marked bullous emphysematous disease. Multifocal airspace disease in left greater than right may reflect pneumonia. Left lower lung focal airspace disease is somewhat nodular in configuration, radiographic follow-up to resolution recommended to exclude pulmonary mass lesion. Electronically Signed   By: Jasmine Pang M.D.   On: 08/12/2017 18:02     STUDIES:  CXR 8/23 > Hyperinflation with marked bullous emphysematous disease. Multifocal airspace disease in left greater than right may reflect pneumonia. Left lower lung focal airspace disease is somewhat nodular in configuration, radiographic follow-up to resolution recommended to exclude pulmonary mass lesion CTA Chest 8/23 > No evidence pulmonary embolism. Severe emphysematous changes in the upper lobes. Confluent areas of infiltrate are seen in the upper lobes bilaterally consistent with  multifocal pneumonia. However, multiple additional nodular areas of irregular opacification are seen in both lungs, some of which contain central cavitation, highly suspicious for septic emboli. 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 Recommend annual imaging followup by CTA or MRA.   CULTURES: Blood 8/23 > Sputum 8/23 >  U/A 8/23 >   ANTIBIOTICS: Vancomycin 8/23>> Cefepime 8/23 >>   SIGNIFICANT EVENTS: 8/23 > Presents to ED  LINES/TUBES: PIV   DISCUSSION: 47 year old male with PMH of MSSA Endocarditis presents to ED with tachycardia and hypoxia. 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  Grade 1 Diastolic Dysfunction (EF 55-60)  H/O Tricuspid Valve Endocarditis  P:  Cardiac Monitoring ECHO pending  Trend Troponin    Anion Gap Metabolic Acidosis with Lactic Acidosis  LA 7.21 > 6.42 >>  Acute Renal Injury  On D/C 9/22 Crt 1.95, Now 2.77 >>  P:   Trend BMP Replace electrolytes as indicated  Trend LA  Urine Sodium/Creatitine Pending   Hepatitis C with Cirrhosis  P:   NPO  Thrombocytopenia,  anemia  H/O LUE Radial Vein DVT 7/24  P:  Trend CBC  Maintain Hbg >7  Heparin SQ for VTE  Presumed Recurrent Endocarditis  H/O MSSA and Pseudomonas Bacteremia/Endocarditis  P:   PAN Culture  Trend WBC and Fever Curve Trend LA and PCT  Continue Vancomycin, Zosyn   Hypoglycemia    P:   Trend Glucose  Dextrose PRN    Substance  Abuse > IV Opana, Heroin  P:   RASS Goal 0/-1 Wean Precedex to Achieve RASS    FAMILY  - Updates: Girlfriend updated at bedside   - Inter-disciplinary family meet or Palliative Care meeting due by: 09/05/2017     Jovita KussmaulKatalina Eubanks, AGACNP-BC [facility]  Pgr: [phone]  PCCM Pgr: [phone]

## 2017-08-29 NOTE — ED Notes (Signed)
Patient transported to CT 

## 2017-08-29 NOTE — Progress Notes (Signed)
RT placed pt on BIPAP per order. Pt tried full face mask and could not tolerate. Pt was then placed on nasal mask which he is still struggling with. Pt is not comfortable wearing either mask. While RT is making this note pt yelling for nasal mask to be taken off. Placed pt back on NRB 15 lpm. RT will notify MD and continue to monitor.

## 2017-08-29 NOTE — ED Notes (Signed)
Dr Tegeler informed of troponin results .11

## 2017-08-29 NOTE — ED Notes (Signed)
Dr Tegeler informed of lactic acid results 6.42

## 2017-08-29 NOTE — ED Notes (Signed)
Pt difficult stick, unable to obtain blood cultures prior to starting antibiotics.

## 2017-08-29 NOTE — Progress Notes (Signed)
Pharmacy Antibiotic Note  Johnathan MaduroChristopher [name] is a [age] y.o. male admitted on 08/30/2017 with sepsis.  Pharmacy has been consulted for vancomycin and cefepime dosing. Lactic acid on admission was 7.21 and Tmax of 99.8 and WBC 8.1K on admission.   Plan: Vancomycin 1500 mg IV x 1 loading dose Vancomycin 1000 mg IV every 24 hours.  Goal trough 15-20 mcg/mL. Cefepime 2g IV q24h F/u BCx results Monitor Scr, CBC, VT prn  Height: 5\' 9"  (175.3 cm) Weight: 170 lb (77.1 kg)(patient reported) IBW/kg (Calculated) : 70.7  Temp (24hrs), Avg:99 F (37.2 C), Min:98.1 F (36.7 C), Max:99.8 F (37.7 C)  Recent Labs  Lab 08/21/2017 1736  LATICACIDVEN 7.21*    CrCl cannot be calculated (Patient's most recent lab result is older than the maximum 21 days allowed.).    Allergies  Allergen Reactions  . Ativan [Lorazepam]     Paradoxical Reaction    Antimicrobials this admission: Vancomycin 8/23 >>  Cefepime 8/23 >>   Microbiology results: 8/23 BCx: sent *BCx were not collected prior to abx initiation d/t difficult stick 8/23 UCx: sent   Thank you for allowing pharmacy to be a part of this patient's care.  Otis Peakebecca M Rosario Duey, PharmD PGY1 Pharmacy Resident 08/30/2017 5:54 PM

## 2017-08-29 NOTE — ED Notes (Signed)
Answered patient's call bell to find him crawling out of the end of the bed. Pt helped back into bed in appropriate position. Pt states he does not trust us to help him feel better. Pt refusing to wear nasal cannula at this time, 78% on room air. Critical care notified, orders received, will continue to monitor.

## 2017-08-29 NOTE — ED Provider Notes (Signed)
[facility] EMERGENCY DEPARTMENT Provider Note   CSN: [id number] Arrival date & time: 09-08-17  1707     History   Chief Complaint Chief Complaint  Patient presents with  . Shortness of Breath    HPI Johnathan [name] is a 47 y.o. male.  The history is provided by the patient and medical records. No language interpreter was used.  Shortness of Breath  This is a recurrent problem. The average episode lasts 2 days. The problem occurs continuously.The current episode started yesterday. The problem has been rapidly worsening. Associated symptoms include a fever, cough, sputum production, wheezing and chest pain. Pertinent negatives include no headaches, no rhinorrhea, no neck pain, no hemoptysis, no syncope, no vomiting, no abdominal pain, no rash, no leg swelling and no claudication. It is unknown what precipitated the problem. He has tried beta-agonist inhalers for the symptoms. The treatment provided no relief. He has had prior hospitalizations. Associated medical issues do not include PE, CAD, heart failure, past MI or DVT.    Past Medical History:  Diagnosis Date  . Substance abuse     Patient Active Problem List   Diagnosis Date Noted  . 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 Pneumothorax 09/22/2016  . Acute on chronic renal failure (HCC)   . Cirrhosis of liver with ascites (HCC)   . Chest tube in place   . Pressure injury of skin 08/20/2016  . Acute respiratory failure (HCC)   . Anoxic brain injury (HCC)   . Pleural effusion on right   . Opiate withdrawal (HCC)   . Bacteremia 07/23/2016  . Acute encephalopathy   . Alcohol withdrawal syndrome with complication (HCC)   . 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 Bacteremia due to Pseudomonas 07/20/2016  . Sepsis (HCC) 07/19/2016  . UTI (urinary tract infection) 07/19/2016  . ARF (acute renal failure) (HCC) 07/19/2016  . Hyponatremia 07/19/2016  . Abnormal liver function 07/19/2016  . IVDU  (intravenous drug user) 07/19/2016  . MSSA bacteremia 07/19/2016  . Meningitis 07/19/2016  . Pneumonia 07/19/2016  . AKI (acute kidney injury) (HCC)   . Chronic hepatitis C (HCC) 09/14/2014  . Substance abuse (HCC) 09/14/2014  . Cigarette smoker 09/14/2014  . Poor dentition 09/14/2014    Past Surgical History:  Procedure Laterality Date  . Incision and drainage of bilateral forearm abscesses Bilateral   . IR THORACENTESIS ASP PLEURAL SPACE W/IMG GUIDE  08/01/2016  . KNEE ARTHROSCOPY          Home Medications    Prior to Admission medications   Medication Sig Start Date End Date Taking? Authorizing Provider  acetaminophen (TYLENOL) 325 MG tablet Take 2 tablets (650 mg total) by mouth every 6 (six) hours as needed for mild pain, fever or headache. Patient not taking: Reported on 09/22/2016 09/03/16   Vassie Loll, MD  feeding supplement (BOOST / RESOURCE BREEZE) LIQD Take 1 Container by mouth 2 (two) times daily between meals. Patient not taking: Reported on 09/22/2016 09/04/16   Vassie Loll, MD  folic acid (FOLVITE) 1 MG tablet Take 1 tablet (1 mg total) by mouth daily. 09/04/16   Vassie Loll, MD  furosemide (LASIX) 20 MG tablet Take 2.5 tablets (50 mg total) by mouth 2 (two) times daily. 09/03/16   Vassie Loll, MD  hydrALAZINE (APRESOLINE) 50 MG tablet Take 1 tablet (50 mg total) by mouth every 8 (eight) hours. 09/03/16   Vassie Loll, MD  magnesium oxide (MAG-OX) 400 (241.3 Mg) MG tablet Take 1 tablet (400  mg total) by mouth 2 (two) times daily. 09/03/16   Vassie Loll, MD  multivitamin (PROSIGHT) TABS tablet Take 1 tablet by mouth daily. Patient not taking: Reported on 09/22/2016 09/04/16   Vassie Loll, MD  nicotine (NICODERM CQ - DOSED IN MG/24 HOURS) 21 mg/24hr patch Place 1 patch (21 mg total) onto the skin daily. Patient not taking: Reported on 09/22/2016 09/03/16 09/03/17  Vassie Loll, MD  pantoprazole (PROTONIX) 40 MG tablet Take 1 tablet (40 mg total) by mouth  daily. Patient not taking: Reported on 09/22/2016 09/04/16   Vassie Loll, MD  spironolactone (ALDACTONE) 100 MG tablet Take 100 mg by mouth 2 (two) times daily. 09/10/16   [provider]    Family History No family history on file.  Social History Social History   Tobacco Use  . Smoking status: Current Every Day Smoker    Packs/day: 0.50    Years: 15.00    Pack years: 7.50    Types: Cigarettes  . Smokeless tobacco: Never Used  Substance Use Topics  . Alcohol use: No    Comment: seldom once a year   . Drug use: Yes    Types: Other-see comments    Comment: not in last 2 wks -opana crushed & Injected IV 09/12/14     Allergies   Ativan [lorazepam]   Review of Systems Review of Systems  Constitutional: Positive for chills, diaphoresis, fatigue and fever.  HENT: Negative for congestion and rhinorrhea.   Eyes: Negative for photophobia and visual disturbance.  Respiratory: Positive for cough, sputum production, chest tightness, shortness of breath and wheezing. Negative for hemoptysis and stridor.   Cardiovascular: Positive for chest pain and palpitations. Negative for claudication, leg swelling and syncope.  Gastrointestinal: Negative for abdominal pain and vomiting.  Genitourinary: Negative for dysuria and flank pain.  Musculoskeletal: Negative for back pain, neck pain and neck stiffness.  Skin: Negative for rash and wound.  Neurological: Positive for light-headedness. Negative for dizziness, syncope, weakness and headaches.  Psychiatric/Behavioral: Negative for agitation.  All other systems reviewed and are negative.    Physical Exam Updated Vital Signs Pulse (!) 157   Temp 98.1 F (36.7 C) (Oral)   Ht 5\' 9"  (1.753 m)   SpO2 94%   BMI 20.23 kg/m   Physical Exam  Constitutional: He is oriented to person, place, and time. He appears well-developed and well-nourished. He appears toxic. He appears ill. No distress.  HENT:  Head: Normocephalic and atraumatic.   Right Ear: External ear normal.  Left Ear: External ear normal.  Nose: Nose normal.  Mouth/Throat: Oropharynx is clear and moist. No oropharyngeal exudate.  Eyes: Pupils are equal, round, and reactive to light. Conjunctivae and EOM are normal.  Neck: Normal range of motion. Neck supple.  Cardiovascular: Intact distal pulses. Tachycardia present.  Pulmonary/Chest: Accessory muscle usage present. No stridor. Tachypnea noted. He is in respiratory distress. He has rhonchi.  Abdominal: Soft. There is no tenderness. There is no rebound and no guarding.  Musculoskeletal: He exhibits no edema or tenderness.  Neurological: He is alert and oriented to person, place, and time. He displays normal reflexes. No cranial nerve deficit. He exhibits normal muscle tone. Coordination normal.  Skin: Skin is warm. Capillary refill takes less than 2 seconds. No rash noted. He is not diaphoretic. No erythema.     ED Treatments / Results  Labs (all labs ordered are listed, but only abnormal results are displayed) Labs Reviewed  CBC WITH DIFFERENTIAL/PLATELET - Abnormal; Notable for the  following components:      Result Value   MCH 25.1 (*)    RDW 16.4 (*)    Platelets 77 (*)    Lymphs Abs 0.4 (*)    All other components within normal limits  URINALYSIS, ROUTINE W REFLEX MICROSCOPIC - Abnormal; Notable for the following components:   Color, Urine AMBER (*)    APPearance CLOUDY (*)    Specific Gravity, Urine 1.034 (*)    Hgb urine dipstick MODERATE (*)    Protein, ur 30 (*)    Bacteria, UA FEW (*)    Non Squamous Epithelial 0-5 (*)    All other components within normal limits  RAPID URINE DRUG SCREEN, HOSP PERFORMED - Abnormal; Notable for the following components:   Opiates POSITIVE (*)    All other components within normal limits  COMPREHENSIVE METABOLIC PANEL - Abnormal; Notable for the following components:   CO2 20 (*)    Glucose, Bld 62 (*)    BUN 56 (*)    Creatinine, Ser 2.77 (*)    Calcium  7.7 (*)    Total Protein 6.3 (*)    Albumin 2.3 (*)    AST 48 (*)    Total Bilirubin 2.3 (*)    GFR calc non Af Amer 26 (*)    GFR calc Af Amer 30 (*)    Anion gap 18 (*)    All other components within normal limits  GLUCOSE, CAPILLARY - Abnormal; Notable for the following components:   Glucose-Capillary 57 (*)    All other components within normal limits  GLUCOSE, CAPILLARY - Abnormal; Notable for the following components:   Glucose-Capillary 100 (*)    All other components within normal limits  I-STAT CG4 LACTIC ACID, ED - Abnormal; Notable for the following components:   Lactic Acid, Venous 7.21 (*)    All other components within normal limits  I-STAT TROPONIN, ED - Abnormal; Notable for the following components:   Troponin i, poc 0.11 (*)    All other components within normal limits  I-STAT CG4 LACTIC ACID, ED - Abnormal; Notable for the following components:   Lactic Acid, Venous 6.42 (*)    All other components within normal limits  I-STAT ARTERIAL BLOOD GAS, ED - Abnormal; Notable for the following components:   pO2, Arterial 63.0 (*)    TCO2 21 (*)    Acid-base deficit 4.0 (*)    All other components within normal limits  POCT I-STAT 3, ART BLOOD GAS (G3+) - Abnormal; Notable for the following components:   pCO2 arterial 30.4 (*)    pO2, Arterial 77.0 (*)    Bicarbonate 19.3 (*)    TCO2 20 (*)    Acid-base deficit 4.0 (*)    All other components within normal limits  URINE CULTURE  CULTURE, BLOOD (ROUTINE X 2)  CULTURE, BLOOD (ROUTINE X 2)  EXPECTORATED SPUTUM ASSESSMENT W REFEX TO RESP CULTURE  MRSA PCR SCREENING  SODIUM, URINE, RANDOM  CREATININE, URINE, RANDOM  BLOOD GAS, ARTERIAL  TROPONIN I  TROPONIN I  LACTIC ACID, PLASMA  LACTIC ACID, PLASMA  MAGNESIUM  PHOSPHORUS  HIV ANTIBODY (ROUTINE TESTING)  BLOOD GAS, ARTERIAL  PROCALCITONIN  PROCALCITONIN  TROPONIN I  I-STAT TROPONIN, ED    EKG EKG Interpretation  Date/Time:  Friday August 29 2017  17:27:55 EDT Ventricular Rate:  157 PR Interval:    QRS Duration: 110 QT Interval:  316 QTC Calculation: 510 R Axis:     Text Interpretation:  Sinus tachycardia  Incomplete right bundle branch block Possible Inferior infarct , age undetermined Abnormal ECG sinus tachycardia When comapred to prior, faster rate.  No STEMI Confirmed by Theda Belfastegeler, Chris ([id number]) on 01/26/2017 5:29:16 PM   Radiology Ct Angio Chest Pe W And/or Wo Contrast  Result Date: 01/26/2017 CLINICAL DATA:  Diffuse pleuritic chest pain, shortness of breath, tachycardia, hypoxia, tachypnea, sudden onset yesterday, high clinical pretest probability for pulmonary embolism, history smoking, IV substance abuse EXAM: CT ANGIOGRAPHY CHEST WITH CONTRAST TECHNIQUE: Multidetector CT imaging of the chest was performed using the standard protocol during bolus administration of intravenous contrast. Multiplanar CT image reconstructions and MIPs were obtained to evaluate the vascular anatomy. 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 No aortic dissection. Heart unremarkable. No pericardial effusion. Pulmonary arteries well opacified and patent. No evidence of pulmonary embolism. Mediastinum/Nodes: Retained fluid in distal esophagus. Base of cervical region normal appearance. No thoracic adenopathy. Lungs/Pleura: Dependent atelectasis in the posterior lower lobes bilaterally. Severe emphysematous changes in the upper lobes. Multiple patchy foci of irregular nodular opacification are seen in both lungs, some which demonstrate central cavitation, suspicious for septic emboli. Additional areas of more confluent opacity are seen within the RIGHT upper lobe and LEFT upper lobe consistent with multifocal pneumonia as well. Unable to exclude underlying pulmonary nodules/tumor. No pleural effusion or pneumothorax. Upper  Abdomen: Significant splenomegaly. Normal sized lymph nodes at gastrohepatic ligament. Remaining visualized upper abdomen unremarkable. Musculoskeletal: No acute osseous findings. Review of the MIP images confirms the above findings. IMPRESSION: No evidence pulmonary embolism. Severe emphysematous changes in the upper lobes. Confluent areas of infiltrate are seen in the upper lobes bilaterally consistent with multifocal pneumonia. However, multiple additional nodular areas of irregular opacification are seen in both lungs, some of which contain central cavitation, highly suspicious for septic emboli. 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 Recommend annual imaging followup by CTA or MRA. This recommendation follows 2010 ACCF/AHA/AATS/ACR/ASA/SCA/SCAI/SIR/STS/SVM Guidelines for the Diagnosis and Management of Patients with Thoracic Aortic Disease. Circulation. 2010; 121: [id number] Findings called to Dr. Rush Landmarkegeler on 001/20/2019 at 1855 hrs. Aortic Atherosclerosis (ICD10-I70.0). Aortic aneurysm NOS (ICD10-I71.9). Emphysema (ICD10-J43.9). Electronically Signed   By: Ulyses SouthwardMark  Boles M.D.   On: 001/20/2019 18:56   Dg Chest Portable 1 View  Result Date: 01/26/2017 CLINICAL DATA:  Chest pain EXAM: PORTABLE CHEST 1 VIEW COMPARISON:  09/28/2016, 09/27/2016, 09/26/2016, CT chest 09/22/2016 FINDINGS: Hyperinflation with emphysematous disease and bullous change in the apices. Increased interstitial opacity at the bases. Multifocal opacities within the bilateral lungs. Somewhat nodular configuration in the left lower lung. Stable cardiomediastinal silhouette. No pneumothorax. IMPRESSION: Hyperinflation with marked bullous emphysematous disease. Multifocal airspace disease in left greater than right may reflect pneumonia. Left lower lung focal airspace disease is somewhat nodular in configuration, radiographic follow-up to resolution recommended to exclude pulmonary mass lesion.  Electronically Signed   By: Jasmine PangKim  Fujinaga M.D.   On: 001/20/2019 18:02    Procedures Procedures (including critical care time)  CRITICAL CARE Performed by: Canary Brimhristopher J Tegeler Total critical care time: 50 minutes Critical care time was exclusive of separately billable procedures and treating other patients. Critical care was necessary to treat or prevent imminent or life-threatening deterioration. Critical care was time spent personally by me on the following activities: development of treatment plan with patient and/or surrogate as well as nursing, discussions with consultants, evaluation of patient's response to treatment, examination of patient, obtaining history  from patient or surrogate, ordering and performing treatments and interventions, ordering and review of laboratory studies, ordering and review of radiographic studies, pulse oximetry and re-evaluation of patient's condition.   Medications Ordered in ED Medications  metroNIDAZOLE (FLAGYL) IVPB 500 mg (500 mg Intravenous New Bag/Given 08/24/2017 1936)  vancomycin (VANCOCIN) IVPB 1000 mg/200 mL premix (has no administration in time range)  ceFEPIme (MAXIPIME) 2 g in sodium chloride 0.9 % 100 mL IVPB (has no administration in time range)  sodium chloride 0.9 % bolus 1,000 mL (0 mLs Intravenous Stopped 09/04/2017 1915)  ceFEPIme (MAXIPIME) 2 g in sodium chloride 0.9 % 100 mL IVPB (0 g Intravenous Stopped 08/09/2017 1915)  sodium chloride 0.9 % bolus 1,000 mL (0 mLs Intravenous Stopped 08/31/2017 2024)  vancomycin (VANCOCIN) 1,500 mg in sodium chloride 0.9 % 500 mL IVPB (1,500 mg Intravenous New Bag/Given 08/31/2017 1839)  iopamidol (ISOVUE-370) 76 % injection 100 mL (100 mLs Intravenous Contrast Given 09/04/2017 1823)  sodium chloride 0.9 % bolus 1,000 mL (1,000 mLs Intravenous New Bag/Given 08/26/2017 1939)     Initial Impression / Assessment and Plan / ED Course  I have reviewed the triage vital signs and the nursing notes.  Pertinent labs &  imaging results that were available during my care of the patient were reviewed by me and considered in my medical decision making (see chart for details).     Johnathan [name] is a 47 y.o. male with a past medical history significant for prior endocarditis, IV drug abuse, cirrhosis, chronic kidney disease and prior pneumothorax who presents with fevers, chills, productive cough, chest pain, and shortness of breath.  Patient is brought in by EMS.  Patient says that starting yesterday he began having fevers, chills, and coughing up a brown-like sputum.  He says that this feels similar to his prior infection last year which chart review shows was endocarditis with pneumonia and sepsis.  Patient says that he continues to use IV heroin most recently 3 days ago.  He reports any headache neck pain or neck stiffness.  Patient reports chest tightness across his chest.  EMS gave him albuterol treatment in route for wheezing.  Patient denies nausea, vomiting, conservation, diarrhea, or dysuria.  He reports diffuse chest discomfort when he is coughing or taking deep breaths.  He denies any history of DVT or PE.  On exam, patient is tachycardic in the 160s and 150s.  His temperature is 99 rectally.  Respiratory rate is in the 30s and 40s and patient is hypoxic.  Patient placed on nasal cannula oxygen supplementation to maintain O2 sats.  Patient had coarse breath sounds in all lung fields with minimal wheezing.  I suspect breathing treatment just before arrival help with the wheezing EMS appreciated.  Chest was diffusely tender.  Patient's heart was beating so fast it was difficult to hear a murmur.  Patient's abdomen was nontender.  Back was nontender.  Abdomen was nontender.  Patient was alert.  Patient was made a code sepsis and will be given broad-spectrum antibiotic's.  Given his cough and hypoxia I have a high suspicion for pneumonia.  With his history of endocarditis and continued IV drug use I suspect he may  have recurrent endocarditis given how sick the patient appears.  Next  Lactic acid came back initially at 7.21.  Patient will continue his fluid boluses and antibiotics.  Chest x-ray shows multifocal abnormality that may be pneumonia. Given  his lack of headache or neck pain or neck stiffness, have low suspicion  for recurrent meningitis.  Will still attempt to get a PE study if his kidney function allows given his hypoxia and tachycardia and his pleuritic chest pain.  Patient will be admitted for further management.  7:19 PM Radiology called report that patient's PE study did not show evidence of pulmonary embolism but did show evidence of pneumonia and septic emboli likely.  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    Given the patient's persistent tachycardia, tachypnea, and lactic acid of greater than 7, critical care will be called for recommendations.  7:28 PM Just spoke with critical care and a request patient receive a another liter of fluids and have his lactic trended to see if he needs ICU admission versus stepdown.  8:59 PM Patient's lactic acid is downtrending however he now has a positive troponin.  Patient also continues to have increased work of breathing with a respirator in the 40s and 50s.  A trial of BiPAP was attempted and he did not tolerate this due to claustrophobia.  He says that he will not do BiPAP.  Patient is advised that eventually he will likely tire out and require intubation.  Patient understands this but does not want BiPAP at this time.    Critical care was called back and will come see the patient to determine if he is stable for stepdown.  Critical care saw the patient and will admit to ICU.   Final Clinical Impressions(s) / ED Diagnoses   Final diagnoses:  Sepsis due to pneumonia [facility])    ED Discharge Orders    None      Clinical Impression: 1. Sepsis due to pneumonia [facility])      Disposition: Admit  This note was prepared with assistance of Dragon voice recognition software. Occasional wrong-word or sound-a-like substitutions may have occurred due to the inherent limitations of voice recognition software.     Tegeler, Canary Brim, MD 08/30/17 [phone]

## 2017-08-29 NOTE — ED Notes (Signed)
Unsuccessful IV attempt x 2 by this RN.  

## 2017-08-29 NOTE — ED Triage Notes (Signed)
Pt BIB EMS for shortness of breath, chest pain on palpation, and cough with back pain. Pt states he has had a collapsed lung in the past and it feels similar. Pt is pale diaphoretic and tachynpic. 10mg  albuterol given in route.

## 2017-08-30 ENCOUNTER — Inpatient Hospital Stay (HOSPITAL_COMMUNITY): Payer: Medicaid Other

## 2017-08-30 DIAGNOSIS — A419 Sepsis, unspecified organism: Secondary | ICD-10-CM | POA: Diagnosis present

## 2017-08-30 DIAGNOSIS — I38 Endocarditis, valve unspecified: Secondary | ICD-10-CM

## 2017-08-30 DIAGNOSIS — R7881 Bacteremia: Secondary | ICD-10-CM

## 2017-08-30 DIAGNOSIS — Z8679 Personal history of other diseases of the circulatory system: Secondary | ICD-10-CM

## 2017-08-30 DIAGNOSIS — I330 Acute and subacute infective endocarditis: Secondary | ICD-10-CM

## 2017-08-30 DIAGNOSIS — K7460 Unspecified cirrhosis of liver: Secondary | ICD-10-CM

## 2017-08-30 DIAGNOSIS — F1990 Other psychoactive substance use, unspecified, uncomplicated: Secondary | ICD-10-CM

## 2017-08-30 DIAGNOSIS — G9340 Encephalopathy, unspecified: Secondary | ICD-10-CM

## 2017-08-30 DIAGNOSIS — B181 Chronic viral hepatitis B without delta-agent: Secondary | ICD-10-CM

## 2017-08-30 DIAGNOSIS — J9601 Acute respiratory failure with hypoxia: Secondary | ICD-10-CM

## 2017-08-30 DIAGNOSIS — Z9911 Dependence on respirator [ventilator] status: Secondary | ICD-10-CM

## 2017-08-30 DIAGNOSIS — I2690 Septic pulmonary embolism without acute cor pulmonale: Secondary | ICD-10-CM

## 2017-08-30 DIAGNOSIS — R6521 Severe sepsis with septic shock: Secondary | ICD-10-CM

## 2017-08-30 DIAGNOSIS — Z8619 Personal history of other infectious and parasitic diseases: Secondary | ICD-10-CM

## 2017-08-30 DIAGNOSIS — B9562 Methicillin resistant Staphylococcus aureus infection as the cause of diseases classified elsewhere: Secondary | ICD-10-CM

## 2017-08-30 DIAGNOSIS — F17210 Nicotine dependence, cigarettes, uncomplicated: Secondary | ICD-10-CM

## 2017-08-30 DIAGNOSIS — J439 Emphysema, unspecified: Secondary | ICD-10-CM

## 2017-08-30 DIAGNOSIS — Z885 Allergy status to narcotic agent status: Secondary | ICD-10-CM

## 2017-08-30 LAB — GLUCOSE, CAPILLARY
GLUCOSE-CAPILLARY: 57 mg/dL — AB (ref 70–99)
GLUCOSE-CAPILLARY: 143 mg/dL — AB (ref 70–99)
Glucose-Capillary: 131 mg/dL — ABNORMAL HIGH (ref 70–99)
Glucose-Capillary: 105 mg/dL — ABNORMAL HIGH (ref 70–99)
Glucose-Capillary: 90 mg/dL (ref 70–99)
Glucose-Capillary: 106 mg/dL — ABNORMAL HIGH (ref 70–99)

## 2017-08-30 LAB — POCT I-STAT 3, ART BLOOD GAS (G3+)
ACID-BASE DEFICIT: 4 mmol/L — AB (ref 0.0–2.0)
Bicarbonate: 23.1 mmol/L (ref 20.0–28.0)
O2 SAT: 93 %
PCO2 ART: 51.7 mmHg — AB (ref 32.0–48.0)
PH ART: 7.261 — AB (ref 7.350–7.450)
TCO2: 25 mmol/L (ref 22–32)
pO2, Arterial: 79 mmHg — ABNORMAL LOW (ref 83.0–108.0)

## 2017-08-30 LAB — BLOOD GAS, ARTERIAL
ACID-BASE DEFICIT: 5.3 mmol/L — AB (ref 0.0–2.0)
Acid-base deficit: 5 mmol/L — ABNORMAL HIGH (ref 0.0–2.0)
Acid-base deficit: 1.9 mmol/L (ref 0.0–2.0)
BICARBONATE: 20.8 mmol/L (ref 20.0–28.0)
Bicarbonate: 23.3 mmol/L (ref 20.0–28.0)
Bicarbonate: 20 mmol/L (ref 20.0–28.0)
DRAWN BY: 511911
Drawn by: 511911
Drawn by: 51702
FIO2: 100
FIO2: 60
FIO2: 0.5
LHR: 24 {breaths}/min
O2 SAT: 97.4 %
O2 Saturation: 98.9 %
O2 Saturation: 90.7 %
PATIENT TEMPERATURE: 98.5
PATIENT TEMPERATURE: 102.2
PCO2 ART: 46.9 mmHg (ref 32.0–48.0)
PCO2 ART: 45.9 mmHg (ref 32.0–48.0)
PEEP/CPAP: 10 cmH2O
PEEP: 5 cmH2O
PEEP: 5 cmH2O
PH ART: 7.275 — AB (ref 7.350–7.450)
PO2 ART: 66.6 mmHg — AB (ref 83.0–108.0)
PO2 ART: 116 mmHg — AB (ref 83.0–108.0)
PRESSURE CONTROL: 20 cmH2O
Patient temperature: 98
RATE: 26 resp/min
RATE: 28 resp/min
VT: 570 mL
VT: 570 mL
pCO2 arterial: 46.6 mmHg (ref 32.0–48.0)
pH, Arterial: 7.27 — ABNORMAL LOW (ref 7.350–7.450)
pH, Arterial: 7.317 — ABNORMAL LOW (ref 7.350–7.450)
pO2, Arterial: 257 mmHg — ABNORMAL HIGH (ref 83.0–108.0)

## 2017-08-30 LAB — BASIC METABOLIC PANEL
ANION GAP: 13 (ref 5–15)
BUN: 58 mg/dL — ABNORMAL HIGH (ref 6–20)
CALCIUM: 7.1 mg/dL — AB (ref 8.9–10.3)
CO2: 23 mmol/L (ref 22–32)
CREATININE: 2.36 mg/dL — AB (ref 0.61–1.24)
Chloride: 104 mmol/L (ref 98–111)
GFR, EST AFRICAN AMERICAN: 36 mL/min — AB (ref >60)
GFR, EST NON AFRICAN AMERICAN: 31 mL/min — AB (ref >60)
Glucose, Bld: 71 mg/dL (ref 70–99)
Potassium: 3.8 mmol/L (ref 3.5–5.1)
SODIUM: 140 mmol/L (ref 135–145)

## 2017-08-30 LAB — LACTIC ACID, PLASMA
Lactic Acid, Venous: 3.4 mmol/L (ref 0.5–1.9)
Lactic Acid, Venous: 5.8 mmol/L (ref 0.5–1.9)

## 2017-08-30 LAB — BLOOD CULTURE ID PANEL (REFLEXED)
ACINETOBACTER BAUMANNII: NOT DETECTED
CANDIDA KRUSEI: NOT DETECTED
CANDIDA PARAPSILOSIS: NOT DETECTED
Candida albicans: NOT DETECTED
Candida glabrata: NOT DETECTED
Candida tropicalis: NOT DETECTED
ESCHERICHIA COLI: NOT DETECTED
Enterobacter cloacae complex: NOT DETECTED
Enterobacteriaceae species: NOT DETECTED
Enterococcus species: NOT DETECTED
Haemophilus influenzae: NOT DETECTED
KLEBSIELLA OXYTOCA: NOT DETECTED
Klebsiella pneumoniae: NOT DETECTED
Listeria monocytogenes: NOT DETECTED
Methicillin resistance: DETECTED — AB
Neisseria meningitidis: NOT DETECTED
PSEUDOMONAS AERUGINOSA: NOT DETECTED
Proteus species: NOT DETECTED
SERRATIA MARCESCENS: NOT DETECTED
STAPHYLOCOCCUS AUREUS BCID: DETECTED — AB
STREPTOCOCCUS PNEUMONIAE: NOT DETECTED
Staphylococcus species: DETECTED — AB
Streptococcus agalactiae: NOT DETECTED
Streptococcus pyogenes: NOT DETECTED
Streptococcus species: NOT DETECTED

## 2017-08-30 LAB — CBC
HEMATOCRIT: 35.4 % — AB (ref 39.0–52.0)
Hemoglobin: 11.3 g/dL — ABNORMAL LOW (ref 13.0–17.0)
MCH: 25.2 pg — ABNORMAL LOW (ref 26.0–34.0)
MCHC: 31.9 g/dL (ref 30.0–36.0)
MCV: 79 fL (ref 78.0–100.0)
PLATELETS: 37 10*3/uL — AB (ref 150–400)
RBC: 4.48 MIL/uL (ref 4.22–5.81)
RDW: 15.9 % — AB (ref 11.5–15.5)
WBC: 5.2 10*3/uL (ref 4.0–10.5)

## 2017-08-30 LAB — MAGNESIUM
MAGNESIUM: 1.8 mg/dL (ref 1.7–2.4)
Magnesium: 1.3 mg/dL — ABNORMAL LOW (ref 1.7–2.4)

## 2017-08-30 LAB — PHOSPHORUS: Phosphorus: 3 mg/dL (ref 2.5–4.6)

## 2017-08-30 LAB — CORTISOL: Cortisol, Plasma: 32.6 ug/dL

## 2017-08-30 LAB — PROCALCITONIN
Procalcitonin: 8.05 ng/mL
Procalcitonin: 8.94 ng/mL

## 2017-08-30 LAB — TROPONIN I
TROPONIN I: 0.04 ng/mL — AB (ref <0.03)
TROPONIN I: 0.04 ng/mL — AB (ref <0.03)
TROPONIN I: 0.05 ng/mL — AB (ref <0.03)

## 2017-08-30 LAB — MRSA PCR SCREENING: MRSA BY PCR: POSITIVE — AB

## 2017-08-30 LAB — HIV ANTIBODY (ROUTINE TESTING W REFLEX): HIV Screen 4th Generation wRfx: NONREACTIVE

## 2017-08-30 MED ORDER — DEXTROSE-NACL 5-0.9 % IV SOLN
INTRAVENOUS | Status: DC
  Administered 2017-08-30: 04:00:00 via INTRAVENOUS

## 2017-08-30 MED ORDER — FENTANYL CITRATE (PF) 100 MCG/2ML IJ SOLN
INTRAMUSCULAR | Status: AC
  Administered 2017-08-30: 50 ug
  Filled 2017-08-30: qty 4

## 2017-08-30 MED ORDER — MIDAZOLAM HCL 2 MG/2ML IJ SOLN
2.0000 mg | INTRAMUSCULAR | Status: AC | PRN
  Administered 2017-08-30 (×3): 2 mg via INTRAVENOUS
  Filled 2017-08-30 (×3): qty 2

## 2017-08-30 MED ORDER — SODIUM CHLORIDE 0.9% FLUSH
10.0000 mL | Freq: Two times a day (BID) | INTRAVENOUS | Status: DC
  Administered 2017-08-30 – 2017-09-01 (×5): 10 mL

## 2017-08-30 MED ORDER — MAGNESIUM SULFATE 2 GM/50ML IV SOLN
2.0000 g | Freq: Once | INTRAVENOUS | Status: AC
  Administered 2017-08-30: 2 g via INTRAVENOUS
  Filled 2017-08-30: qty 50

## 2017-08-30 MED ORDER — CHLORHEXIDINE GLUCONATE CLOTH 2 % EX PADS
6.0000 | MEDICATED_PAD | Freq: Every day | CUTANEOUS | Status: DC
  Administered 2017-08-30 – 2017-09-01 (×3): 6 via TOPICAL

## 2017-08-30 MED ORDER — CHLORHEXIDINE GLUCONATE 0.12% ORAL RINSE (MEDLINE KIT)
15.0000 mL | Freq: Two times a day (BID) | OROMUCOSAL | Status: DC
  Administered 2017-08-30 – 2017-09-01 (×6): 15 mL via OROMUCOSAL

## 2017-08-30 MED ORDER — PHENYLEPHRINE HCL-NACL 10-0.9 MG/250ML-% IV SOLN
0.0000 ug/min | INTRAVENOUS | Status: DC
  Administered 2017-08-30: 200 ug/min via INTRAVENOUS
  Administered 2017-08-30: 20 ug/min via INTRAVENOUS
  Filled 2017-08-30 (×3): qty 250

## 2017-08-30 MED ORDER — ORAL CARE MOUTH RINSE
15.0000 mL | OROMUCOSAL | Status: DC
  Administered 2017-08-30 – 2017-09-01 (×26): 15 mL via OROMUCOSAL

## 2017-08-30 MED ORDER — FAMOTIDINE 40 MG/5ML PO SUSR
20.0000 mg | Freq: Every day | ORAL | Status: DC
  Administered 2017-08-30 – 2017-09-01 (×3): 20 mg
  Filled 2017-08-30 (×3): qty 2.5

## 2017-08-30 MED ORDER — FENTANYL CITRATE (PF) 100 MCG/2ML IJ SOLN: 50.0000 ug | Freq: Once | INTRAMUSCULAR | Status: AC

## 2017-08-30 MED ORDER — FENTANYL 2500MCG IN NS 250ML (10MCG/ML) PREMIX INFUSION
0.0000 ug/h | INTRAVENOUS | Status: DC
  Administered 2017-08-30: 200 ug/h via INTRAVENOUS
  Administered 2017-08-30: 150 ug/h via INTRAVENOUS
  Administered 2017-08-30 – 2017-09-01 (×7): 400 ug/h via INTRAVENOUS
  Filled 2017-08-30 (×10): qty 250

## 2017-08-30 MED ORDER — SODIUM BICARBONATE 8.4 % IV SOLN
50.0000 meq | Freq: Once | INTRAVENOUS | Status: AC
  Administered 2017-08-30: 50 meq via INTRAVENOUS
  Filled 2017-08-30: qty 50

## 2017-08-30 MED ORDER — MIDAZOLAM HCL 2 MG/2ML IJ SOLN: 2.0000 mg | INTRAMUSCULAR | Status: DC | PRN

## 2017-08-30 MED ORDER — DOCUSATE SODIUM 50 MG/5ML PO LIQD: 100.0000 mg | Freq: Two times a day (BID) | ORAL | Status: DC | PRN

## 2017-08-30 MED ORDER — FAMOTIDINE 40 MG/5ML PO SUSR: 20.0000 mg | Freq: Every day | ORAL | Status: DC

## 2017-08-30 MED ORDER — DEXTROSE-NACL 5-0.9 % IV SOLN: INTRAVENOUS | Status: DC

## 2017-08-30 MED ORDER — LACTATED RINGERS IV SOLN: INTRAVENOUS | Status: DC

## 2017-08-30 MED ORDER — HYDROCORTISONE NA SUCCINATE PF 100 MG IJ SOLR
50.0000 mg | Freq: Four times a day (QID) | INTRAMUSCULAR | Status: DC
  Administered 2017-08-30 – 2017-09-01 (×8): 50 mg via INTRAVENOUS
  Filled 2017-08-30 (×8): qty 2

## 2017-08-30 MED ORDER — MIDAZOLAM HCL 2 MG/2ML IJ SOLN
2.0000 mg | INTRAMUSCULAR | Status: DC | PRN
  Administered 2017-08-30 – 2017-09-01 (×14): 2 mg via INTRAVENOUS
  Filled 2017-08-30 (×12): qty 2

## 2017-08-30 MED ORDER — FENTANYL BOLUS VIA INFUSION
50.0000 ug | INTRAVENOUS | Status: DC | PRN
  Administered 2017-08-30 – 2017-09-01 (×7): 50 ug via INTRAVENOUS
  Filled 2017-08-30: qty 50

## 2017-08-30 MED ORDER — JEVITY 1.2 CAL PO LIQD: 1000.0000 mL | ORAL | Status: DC

## 2017-08-30 MED ORDER — FAMOTIDINE 40 MG/5ML PO SUSR
20.0000 mg | Freq: Every day | ORAL | Status: DC
  Filled 2017-08-30: qty 2.5

## 2017-08-30 MED ORDER — DEXTROSE 50 % IV SOLN
INTRAVENOUS | Status: AC
  Administered 2017-08-30: 50 mL
  Filled 2017-08-30: qty 50

## 2017-08-30 MED ORDER — VASOPRESSIN 20 UNIT/ML IV SOLN
0.0300 [IU]/min | INTRAVENOUS | Status: DC
  Administered 2017-08-31 – 2017-09-01 (×2): 0.03 [IU]/min via INTRAVENOUS
  Filled 2017-08-30 (×2): qty 2

## 2017-08-30 MED ORDER — PHENYLEPHRINE HCL-NACL 40-0.9 MG/250ML-% IV SOLN
0.0000 ug/min | INTRAVENOUS | Status: DC
  Administered 2017-08-30: 220 ug/min via INTRAVENOUS
  Administered 2017-08-30: 50 ug/min via INTRAVENOUS
  Administered 2017-08-30: 160 ug/min via INTRAVENOUS
  Administered 2017-09-01 (×3): 400 ug/min via INTRAVENOUS
  Administered 2017-09-01: 100 ug/min via INTRAVENOUS
  Filled 2017-08-30 (×7): qty 250

## 2017-08-30 MED ORDER — ACETAMINOPHEN 160 MG/5ML PO SOLN
325.0000 mg | Freq: Four times a day (QID) | ORAL | Status: DC | PRN
  Administered 2017-08-30 – 2017-08-31 (×2): 325 mg
  Filled 2017-08-30 (×2): qty 20.3

## 2017-08-30 MED ORDER — NOREPINEPHRINE 4 MG/250ML-% IV SOLN
0.0000 ug/min | INTRAVENOUS | Status: DC
  Administered 2017-08-30: 2 ug/min via INTRAVENOUS
  Administered 2017-08-30 (×3): 20 ug/min via INTRAVENOUS
  Administered 2017-08-31: 15 ug/min via INTRAVENOUS
  Administered 2017-08-31: 12 ug/min via INTRAVENOUS
  Administered 2017-08-31: 20 ug/min via INTRAVENOUS
  Administered 2017-08-31: 10 ug/min via INTRAVENOUS
  Filled 2017-08-30 (×8): qty 250

## 2017-08-30 MED ORDER — SODIUM CHLORIDE 0.9% FLUSH: 10.0000 mL | INTRAVENOUS | Status: DC | PRN

## 2017-08-30 MED ORDER — MIDAZOLAM HCL 2 MG/2ML IJ SOLN
INTRAMUSCULAR | Status: AC
  Administered 2017-08-30: 2 mg
  Filled 2017-08-30: qty 6

## 2017-08-30 MED ORDER — VITAL AF 1.2 CAL PO LIQD
1000.0000 mL | ORAL | Status: DC
  Administered 2017-08-30: 75 mL
  Administered 2017-08-31: 1000 mL
  Administered 2017-09-01: 75 mL

## 2017-08-30 NOTE — Progress Notes (Signed)
Notified Dr.Hammonds about critical labs, troponin 0.04 and lactic acid 5.8. Both trending down. Will continue to monitor.

## 2017-08-30 NOTE — Progress Notes (Signed)
Notified NP Leitha BleakKatalina about critical lab, lactic acid 3.4.

## 2017-08-30 NOTE — Progress Notes (Signed)
PULMONARY / CRITICAL CARE MEDICINE   Name: Johnathan [name] MRN: [id number] DOB: [birth year]    ADMISSION DATE:  09/03/2017 CONSULTATION DATE:  08/19/2017  REFERRING MD:  Dr. Rush Landmark   CHIEF COMPLAINT:  Sepsis   HISTORY OF PRESENT ILLNESS:   47 year old male with PMH of MSSA Endocarditis/Bacteremia, IV Drug Abuse with Heroin/IV Opana, Hepatitis C with Cirrhosis, CKD, H/O DVT   Presents to ED on 8/23 with fever, chills, and productive cough with brown-like sputum. States he continues to use IV Heroin with most recent 3 days ago. Upon arrival to ED patient is tachycardiac with HR 150-160, Temp 99, RR 30-40, and Hypoxia with oxygen saturation 70-80. LA 7.21, CXR concerning for multifocal pneumonia. CTA negative for PE, however multiple nodular areas of irregular opacification in both lungs concerning for septic emboli. Given 3L Bolus and Cefepime/Vancomycin. Due to progressive hypoxia attempted BiPAP however patient did not tolerate due to claustrophobia. PCCM asked to consult.     SUBJECTIVE:  Respiratory failure overnight, intubated. Dyssynchronous with the vent  VITAL SIGNS: BP (!) 81/56   Pulse (!) 129   Temp 99.2 F (37.3 C)   Resp (!) 32   Ht 5\' 9"  (1.753 m)   Wt 71.3 kg   SpO2 97%   BMI 23.21 kg/m   VENTILATOR SETTINGS: Vent Mode: PCV FiO2 (%):  [60 %-100 %] 60 % Set Rate:  [24 bmp-26 bmp] 26 bmp Vt Set:  [570 mL] 570 mL PEEP:  [5 cmH20] 5 cmH20 Plateau Pressure:  [16 cmH20-23 cmH20] 16 cmH20  INTAKE / OUTPUT: I/O last 3 completed shifts: In: 4525.7 [I.V.:680.2; IV Piggyback:3845.5] Out: 450 [Urine:450]  PHYSICAL EXAMINATION:   Constitution: Acutely ill appearing  HEENT: [place]/AT, PERRL, EOM-I and MMM Cardio: Sinus tach, Nl S1/S2 and -M/R/G Respiratory: Decreased BS diffusely Abdominal: Soft, NT, ND and +BS GU: foley in place  Neuro: Sedated and intubated Skin: c/d/i   LABS:  BMET Recent Labs  Lab 08/10/2017 1907 08/30/17 0342  NA 136 140  K 3.8 3.8   CL 98 104  CO2 20* 23  BUN 56* 58*  CREATININE 2.77* 2.36*  GLUCOSE 62* 71   Electrolytes Recent Labs  Lab 08/23/2017 1907 08/14/2017 2333 08/30/17 0342  CALCIUM 7.7*  --  7.1*  MG  --  1.3* 1.8  PHOS  --  3.0  --    CBC Recent Labs  Lab 08/31/2017 1730 08/30/17 0342  WBC 8.1 5.2  HGB 14.1 11.3*  HCT 45.0 35.4*  PLT 77* 37*   Coag's No results for input(s): APTT, INR in the last 168 hours.  Sepsis Markers Recent Labs  Lab 08/10/2017 2008 08/12/2017 2333 08/30/17 0341 08/30/17 0342  LATICACIDVEN 6.42* 5.8* 3.4*  --   PROCALCITON  --  8.05  --  8.94   ABG Recent Labs  Lab 08/30/17 0227 08/30/17 0340 08/30/17 0904  PHART 7.270* 7.317* 7.261*  PCO2ART 46.6 46.9 51.7*  PO2ART 257* 66.6* 79.0*   Liver Enzymes Recent Labs  Lab 08/31/2017 1907  AST 48*  ALT 21  ALKPHOS 83  BILITOT 2.3*  ALBUMIN 2.3*   Cardiac Enzymes Recent Labs  Lab 08/31/2017 2333 08/30/17 0342  TROPONINI 0.04* 0.04*   Glucose Recent Labs  Lab 08/23/2017 2217 08/11/2017 2315 08/30/17 0329 08/30/17 0448 08/30/17 0822  GLUCAP 57* 100* 57* 131* 105*   Imaging Ct Angio Chest Pe W And/or Wo Contrast  Result Date: 08/18/2017 CLINICAL DATA:  Diffuse pleuritic chest pain, shortness of breath, tachycardia, hypoxia, tachypnea,  sudden onset yesterday, high clinical pretest probability for pulmonary embolism, history smoking, IV substance abuse EXAM: CT ANGIOGRAPHY CHEST WITH CONTRAST TECHNIQUE: Multidetector CT imaging of the chest was performed using the standard protocol during bolus administration of intravenous contrast. Multiplanar CT image reconstructions and MIPs were obtained to evaluate the vascular anatomy. 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 No aortic dissection. Heart unremarkable. No pericardial effusion. Pulmonary arteries well opacified and  patent. No evidence of pulmonary embolism. Mediastinum/Nodes: Retained fluid in distal esophagus. Base of cervical region normal appearance. No thoracic adenopathy. Lungs/Pleura: Dependent atelectasis in the posterior lower lobes bilaterally. Severe emphysematous changes in the upper lobes. Multiple patchy foci of irregular nodular opacification are seen in both lungs, some which demonstrate central cavitation, suspicious for septic emboli. Additional areas of more confluent opacity are seen within the RIGHT upper lobe and LEFT upper lobe consistent with multifocal pneumonia as well. Unable to exclude underlying pulmonary nodules/tumor. No pleural effusion or pneumothorax. Upper Abdomen: Significant splenomegaly. Normal sized lymph nodes at gastrohepatic ligament. Remaining visualized upper abdomen unremarkable. Musculoskeletal: No acute osseous findings. Review of the MIP images confirms the above findings. IMPRESSION: No evidence pulmonary embolism. Severe emphysematous changes in the upper lobes. Confluent areas of infiltrate are seen in the upper lobes bilaterally consistent with multifocal pneumonia. However, multiple additional nodular areas of irregular opacification are seen in both lungs, some of which contain central cavitation, highly suspicious for septic emboli. 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 Recommend annual imaging followup by CTA or MRA. This recommendation follows 2010 ACCF/AHA/AATS/ACR/ASA/SCA/SCAI/SIR/STS/SVM Guidelines for the Diagnosis and Management of Patients with Thoracic Aortic Disease. Circulation. 2010; 121: Z610-R604 Findings called to Dr. Rush Landmark on 2017-09-20 at 1855 hrs. Aortic Atherosclerosis (ICD10-I70.0). Aortic aneurysm NOS (ICD10-I71.9). Emphysema (ICD10-J43.9). Electronically Signed   By: Ulyses Southward M.D.   On: 2017-09-20 18:56   Dg Chest Port 1 View  Result Date: 08/30/2017 CLINICAL DATA:  Central line placement. EXAM:  PORTABLE CHEST 1 VIEW COMPARISON:  Radiograph less than 1 hour prior. FINDINGS: Tip of the right central line in the mid SVC. No pneumothorax. Endotracheal tube tip remains at the thoracic inlet. Enteric tube in place with tip below the diaphragm not included in the field of view. Unchanged heart size and mediastinal contours. Unchanged diffuse bilateral airspace opacities and possible pleural effusions. Emphysema again seen. IMPRESSION: 1. Tip of the right internal jugular central venous catheter in the mid SVC. No pneumothorax. 2. Unchanged multifocal bilateral airspace disease and possible pleural effusions. Electronically Signed   By: Rubye Oaks M.D.   On: 08/30/2017 02:29   Dg Chest Port 1 View  Result Date: 08/30/2017 CLINICAL DATA:  Acute respiratory failure. Endotracheal and enteric tube placement. EXAM: PORTABLE CHEST 1 VIEW COMPARISON:  Radiographs and CT yesterday. FINDINGS: Endotracheal tube tip at the thoracic inlet 4.8 cm from the carina. Enteric tube in place with tip below the diaphragm not included in the field of view. Side-port not well visualized and may be in the stomach. Progressive bilateral airspace disease, particular progression at the right lung base with possible developing pleural effusions. Advanced apical emphysema again seen. No pneumothorax. IMPRESSION: 1. Endotracheal tube tip at the thoracic inlet. Tip of the enteric tube below the diaphragm, not included in the field of view. 2. Progressive bilateral airspace disease over the past 5 hours. This may represent progression in multifocal pneumonia and septic emboli  versus an element of superimposed ARDS. Electronically Signed   By: Rubye OaksMelanie  Ehinger M.D.   On: 08/30/2017 01:55   Dg Chest Portable 1 View  Result Date: 07-Dec-2017 CLINICAL DATA:  Chest pain EXAM: PORTABLE CHEST 1 VIEW COMPARISON:  09/28/2016, 09/27/2016, 09/26/2016, CT chest 09/22/2016 FINDINGS: Hyperinflation with emphysematous disease and bullous change in  the apices. Increased interstitial opacity at the bases. Multifocal opacities within the bilateral lungs. Somewhat nodular configuration in the left lower lung. Stable cardiomediastinal silhouette. No pneumothorax. IMPRESSION: Hyperinflation with marked bullous emphysematous disease. Multifocal airspace disease in left greater than right may reflect pneumonia. Left lower lung focal airspace disease is somewhat nodular in configuration, radiographic follow-up to resolution recommended to exclude pulmonary mass lesion. Electronically Signed   By: Jasmine PangKim  Fujinaga M.D.   On: 001-Dec-2019 18:02   STUDIES:  CXR 8/23 > Hyperinflation with marked bullous emphysematous disease. Multifocal airspace disease in left greater than right may reflect pneumonia. Left lower lung focal airspace disease is somewhat nodular in configuration, radiographic follow-up to resolution recommended to exclude pulmonary mass lesion CTA Chest 8/23 > No evidence pulmonary embolism. Severe emphysematous changes in the upper lobes. Confluent areas of infiltrate are seen in the upper lobes bilaterally consistent with multifocal pneumonia. However, multiple additional nodular areas of irregular opacification are seen in both lungs, some of which contain central cavitation, highly suspicious for septic emboli. 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 Recommend annual imaging followup by CTA or MRA.   CULTURES: Blood 8/23 > Sputum 8/23 >  U/A 8/23 >   ANTIBIOTICS: Vancomycin 8/23>> Cefepime 8/23 >>   SIGNIFICANT EVENTS: 8/23 > Presents to ED  LINES/TUBES: PIV   DISCUSSION: 47 year old male with PMH of MSSA Endocarditis presents to ED with tachycardia and hypoxia. Found to have central cav  ASSESSMENT / PLAN:  Acute Hypoxic Respiratory Failure in setting of central cavitation in setting of septic emboli with right upper and left upper opacity Severe Emphysematous Disease  H/O RLL  Cavitary Pneumonia in setting of Endocarditis  Intubated 8/24. PVRC RR 26 Vt 570 FiO2 60% PEEP 5. 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 with septic emboli  Grade 1 Diastolic Dysfunction (EF 55-60)  H/O MSSA/Pseudomonas bacteremia/Tricuspid Valve Endocarditis   2D echo pending Consult ID for endocarditis D/C flagyl Continue vancomycin D/C cefepime PAN Culture pending Trend WBC and monitor for fever Trend LA and PCT    Anion Gap Metabolic Acidosis with Lactic Acidosis  LA 7.21 > 6.42 >> 3.4 Acute Renal Injury  On D/C 9/22 Crt 1.9 >> 2.77 >> 2.36  Trend BMP Replace electrolytes as indicated  Urine Sodium/Creatitine Pending   Hepatitis C with Cirrhosis  Start TF per nutrition  Thrombocytopenia, anemia  H/O LUE Radial Vein DVT 7/24  Trend CBC  Maintain Hbg >7  Heparin SQ for VTE  Hypoglycemia -    Trend Glucose q4h Start TF Dextrose w/NS 75cc/hr 3 hours   Substance Abuse > IV Opana, Heroin  CIWA protocol RASS Goal 0/-1 Wean Precedex to Achieve RASS   VTE: heparin  Diet: NPO  IVF: dextrose 5 w/NS 75cc/hr  FAMILY  - Updates: No family bedside.  Insert a-line.  Change to levophed, adjust abx  - Inter-disciplinary family meet or Palliative Care meeting due by: 09/05/2017  The patient is critically ill with multiple organ systems failure and requires high complexity decision making for assessment and support, frequent evaluation and titration of therapies, application of advanced monitoring technologies and extensive interpretation of multiple databases.   Critical Care Time devoted to patient care services described in this note is  36  Minutes. This time reflects time of care of this signee Dr Koren Bound. This critical care time does not reflect  procedure time, or teaching time or supervisory time of PA/NP/Med student/Med Resident etc but could involve care discussion time.  Alyson Reedy, M.D. [facility] Pulmonary/Critical Care Medicine. Pager: [phone]. After hours pager: [phone].

## 2017-08-30 NOTE — Procedures (Signed)
Intubation Procedure Note Johnathan [name] [id number] 02-23-1970  Procedure: Intubation Indications: Airway protection and maintenance  Procedure Details Consent: Risks of procedure as well as the alternatives and risks of each were explained to the (patient/caregiver).  Consent for procedure obtained. Time Out: Verified patient identification, verified procedure, site/side was marked, verified correct patient position, special equipment/implants available, medications/allergies/relevent history reviewed, required imaging and test results available.  Performed  Maximum sterile technique was used including antiseptics, cap, gloves, gown and hand hygiene.  MAC and 3    Evaluation Hemodynamic Status: BP stable throughout; O2 sats: stable throughout Patient's Current Condition: stable Complications: No apparent complications Patient did tolerate procedure well. Chest X-ray ordered to verify placement.  CXR: pending.   Milinda Cave 08/30/2017

## 2017-08-30 NOTE — Progress Notes (Addendum)
Patient received 2 mg versed, 20 mg etomidate, 50 mg succinylcholine, and 50 mcg of fentanyl during intubation at 0118 per Dr.Hammonds. Additionally, pt received another 10 mg of etomidate after intubation at 0142.

## 2017-08-30 NOTE — Progress Notes (Signed)
RT attempted to get sputum spec.. No sputum was pulled out after suctioning.

## 2017-08-30 NOTE — Progress Notes (Signed)
Following intubation, patient's BP is lower. Will start vasopressors and place a central line.   I called patient's girlfriend Carlus PavlovJill Benedict [phone] home, [phone] cell) and updated her on his change in condition. I then called patient's father Cindi CarbonChris Alcala [phone]) and updated him on patient's condition. He said that the patient's mother is a Engineer, civil (consulting)nurse and asked me to call her too. I attempted to call patient's mother Paulino DoorDebra Templeton [phone]) but there was no answer.

## 2017-08-30 NOTE — Progress Notes (Addendum)
PULMONARY / CRITICAL CARE MEDICINE   Name: Johnathan [name] MRN: [id number] DOB: 18-May-1970    ADMISSION DATE:  08/25/2017 CONSULTATION DATE:  08/31/2017  REFERRING MD:  Dr. Rush Landmark   CHIEF COMPLAINT:  Sepsis   HISTORY OF PRESENT ILLNESS:   47 year old male with PMH of MSSA Endocarditis/Bacteremia, IV Drug Abuse with Heroin/IV Opana, Hepatitis C with Cirrhosis, CKD, H/O DVT   Presents to ED on 8/23 with fever, chills, and productive cough with brown-like sputum. States he continues to use IV Heroin with most recent 3 days ago. Upon arrival to ED patient is tachycardiac with HR 150-160, Temp 99, RR 30-40, and Hypoxia with oxygen saturation 70-80. LA 7.21, CXR concerning for multifocal pneumonia. CTA negative for PE, however multiple nodular areas of irregular opacification in both lungs concerning for septic emboli. Given 3L Bolus and Cefepime/Vancomycin. Due to progressive hypoxia attempted BiPAP however patient did not tolerate due to claustrophobia. PCCM asked to consult.     PAST MEDICAL HISTORY :  He  has a past medical history of Substance abuse.  PAST SURGICAL HISTORY: He  has a past surgical history that includes Incision and drainage of bilateral forearm abscesses (Bilateral); Knee arthroscopy; and IR THORACENTESIS ASP PLEURAL SPACE W/IMG GUIDE (08/01/2016).  Allergies  Allergen Reactions  . Ativan [Lorazepam]     Paradoxical Reaction    No current facility-administered medications on file prior to encounter.    Current Outpatient Medications on File Prior to Encounter  Medication Sig  . SUBOXONE 8-2 MG FILM Place 2 Film under the tongue daily.  Marland Kitchen acetaminophen (TYLENOL) 325 MG tablet Take 2 tablets (650 mg total) by mouth every 6 (six) hours as needed for mild pain, fever or headache. (Patient not taking: Reported on 09/22/2016)  . feeding supplement (BOOST / RESOURCE BREEZE) LIQD Take 1 Container by mouth 2 (two) times daily between meals. (Patient not taking: Reported on  09/22/2016)  . folic acid (FOLVITE) 1 MG tablet Take 1 tablet (1 mg total) by mouth daily. (Patient not taking: Reported on 08/21/2017)  . furosemide (LASIX) 20 MG tablet Take 2.5 tablets (50 mg total) by mouth 2 (two) times daily. (Patient not taking: Reported on 08/19/2017)  . hydrALAZINE (APRESOLINE) 50 MG tablet Take 1 tablet (50 mg total) by mouth every 8 (eight) hours. (Patient not taking: Reported on 09/03/2017)  . magnesium oxide (MAG-OX) 400 (241.3 Mg) MG tablet Take 1 tablet (400 mg total) by mouth 2 (two) times daily. (Patient not taking: Reported on 08/23/2017)  . multivitamin (PROSIGHT) TABS tablet Take 1 tablet by mouth daily. (Patient not taking: Reported on 09/22/2016)  . nicotine (NICODERM CQ - DOSED IN MG/24 HOURS) 21 mg/24hr patch Place 1 patch (21 mg total) onto the skin daily. (Patient not taking: Reported on 09/22/2016)  . pantoprazole (PROTONIX) 40 MG tablet Take 1 tablet (40 mg total) by mouth daily. (Patient not taking: Reported on 09/22/2016)    FAMILY HISTORY:  His family history is not on file.  SOCIAL HISTORY: He  reports that he has been smoking cigarettes. He has a 7.50 pack-year smoking history. He has never used smokeless tobacco. He reports that he has current or past drug history. Drug: Other-see comments. He reports that he does not drink alcohol.  SUBJECTIVE:  Placed on Vent overnight. Increasing resp acidosis, now on PC with PEEP 10.   VITAL SIGNS: BP (!) 82/56   Pulse (!) 133   Temp 99.4 F (37.4 C) (Axillary)   Resp (!) 33  Ht 5\' 9"  (1.753 m)   Wt 71.3 kg   SpO2 95%   BMI 23.21 kg/m   VENTILATOR SETTINGS: Vent Mode: PRVC FiO2 (%):  [60 %-100 %] 60 % Set Rate:  [24 bmp-26 bmp] 26 bmp Vt Set:  [570 mL] 570 mL PEEP:  [5 cmH20] 5 cmH20 Plateau Pressure:  [16 cmH20-23 cmH20] 16 cmH20  INTAKE / OUTPUT: I/O last 3 completed shifts: In: 4525.7 [I.V.:680.2; IV Piggyback:3845.5] Out: 450 [Urine:450]  PHYSICAL EXAMINATION:   Constitution: supine in  bed, sedated, appears stated age HEENT: [place]/AT, ET in place  Cardio: tachycardic, regular rhythm, no m/r/g, no pitting edema Respiratory: coarse breath sounds, +crackles left Abdominal: hypoactive bs,  GU: foley in place  Neuro: sedated, not responsive to pain  Skin: c/d/i    LABS:  BMET Recent Labs  Lab 08/10/2017 1907 08/30/17 0342  NA 136 140  K 3.8 3.8  CL 98 104  CO2 20* 23  BUN 56* 58*  CREATININE 2.77* 2.36*  GLUCOSE 62* 71    Electrolytes Recent Labs  Lab 08/28/2017 1907 09/05/2017 2333 08/30/17 0342  CALCIUM 7.7*  --  7.1*  MG  --  1.3* 1.8  PHOS  --  3.0  --     CBC Recent Labs  Lab 08/09/2017 1730 08/30/17 0342  WBC 8.1 5.2  HGB 14.1 11.3*  HCT 45.0 35.4*  PLT 77* 37*    Coag's No results for input(s): APTT, INR in the last 168 hours.  Sepsis Markers Recent Labs  Lab 08/30/2017 2008 08/28/2017 2333 08/30/17 0341 08/30/17 0342  LATICACIDVEN 6.42* 5.8* 3.4*  --   PROCALCITON  --  8.05  --  8.94    ABG Recent Labs  Lab 08/30/17 0227 08/30/17 0340 08/30/17 0904  PHART 7.270* 7.317* 7.261*  PCO2ART 46.6 46.9 51.7*  PO2ART 257* 66.6* 79.0*    Liver Enzymes Recent Labs  Lab 08/31/2017 1907  AST 48*  ALT 21  ALKPHOS 83  BILITOT 2.3*  ALBUMIN 2.3*    Cardiac Enzymes Recent Labs  Lab 08/22/2017 2333 08/30/17 0342  TROPONINI 0.04* 0.04*    Glucose Recent Labs  Lab 08/27/2017 2217 08/07/2017 2315 08/30/17 0329 08/30/17 0448 08/30/17 0822  GLUCAP 57* 100* 57* 131* 105*    Imaging Ct Angio Chest Pe W And/or Wo Contrast  Result Date: 08/20/2017 CLINICAL DATA:  Diffuse pleuritic chest pain, shortness of breath, tachycardia, hypoxia, tachypnea, sudden onset yesterday, high clinical pretest probability for pulmonary embolism, history smoking, IV substance abuse EXAM: CT ANGIOGRAPHY CHEST WITH CONTRAST TECHNIQUE: Multidetector CT imaging of the chest was performed using the standard protocol during bolus administration of intravenous  contrast. Multiplanar CT image reconstructions and MIPs were obtained to evaluate the vascular anatomy. 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 No aortic dissection. Heart unremarkable. No pericardial effusion. Pulmonary arteries well opacified and patent. No evidence of pulmonary embolism. Mediastinum/Nodes: Retained fluid in distal esophagus. Base of cervical region normal appearance. No thoracic adenopathy. Lungs/Pleura: Dependent atelectasis in the posterior lower lobes bilaterally. Severe emphysematous changes in the upper lobes. Multiple patchy foci of irregular nodular opacification are seen in both lungs, some which demonstrate central cavitation, suspicious for septic emboli. Additional areas of more confluent opacity are seen within the RIGHT upper lobe and LEFT upper lobe consistent with multifocal pneumonia as well. Unable to exclude underlying pulmonary nodules/tumor. No pleural effusion or pneumothorax. Upper Abdomen: Significant splenomegaly. Normal  sized lymph nodes at gastrohepatic ligament. Remaining visualized upper abdomen unremarkable. Musculoskeletal: No acute osseous findings. Review of the MIP images confirms the above findings. IMPRESSION: No evidence pulmonary embolism. Severe emphysematous changes in the upper lobes. Confluent areas of infiltrate are seen in the upper lobes bilaterally consistent with multifocal pneumonia. However, multiple additional nodular areas of irregular opacification are seen in both lungs, some of which contain central cavitation, highly suspicious for septic emboli. 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 Recommend annual imaging followup by CTA or MRA. This recommendation follows 2010 ACCF/AHA/AATS/ACR/ASA/SCA/SCAI/SIR/STS/SVM Guidelines for the Diagnosis and Management of  Patients with Thoracic Aortic Disease. Circulation. 2010; 121: Z610-R604 Findings called to Dr. Rush Landmark on 08/30/2017 at 1855 hrs. Aortic Atherosclerosis (ICD10-I70.0). Aortic aneurysm NOS (ICD10-I71.9). Emphysema (ICD10-J43.9). Electronically Signed   By: Ulyses Southward M.D.   On: 08/27/2017 18:56   Dg Chest Port 1 View  Result Date: 08/30/2017 CLINICAL DATA:  Central line placement. EXAM: PORTABLE CHEST 1 VIEW COMPARISON:  Radiograph less than 1 hour prior. FINDINGS: Tip of the right central line in the mid SVC. No pneumothorax. Endotracheal tube tip remains at the thoracic inlet. Enteric tube in place with tip below the diaphragm not included in the field of view. Unchanged heart size and mediastinal contours. Unchanged diffuse bilateral airspace opacities and possible pleural effusions. Emphysema again seen. IMPRESSION: 1. Tip of the right internal jugular central venous catheter in the mid SVC. No pneumothorax. 2. Unchanged multifocal bilateral airspace disease and possible pleural effusions. Electronically Signed   By: Rubye Oaks M.D.   On: 08/30/2017 02:29   Dg Chest Port 1 View  Result Date: 08/30/2017 CLINICAL DATA:  Acute respiratory failure. Endotracheal and enteric tube placement. EXAM: PORTABLE CHEST 1 VIEW COMPARISON:  Radiographs and CT yesterday. FINDINGS: Endotracheal tube tip at the thoracic inlet 4.8 cm from the carina. Enteric tube in place with tip below the diaphragm not included in the field of view. Side-port not well visualized and may be in the stomach. Progressive bilateral airspace disease, particular progression at the right lung base with possible developing pleural effusions. Advanced apical emphysema again seen. No pneumothorax. IMPRESSION: 1. Endotracheal tube tip at the thoracic inlet. Tip of the enteric tube below the diaphragm, not included in the field of view. 2. Progressive bilateral airspace disease over the past 5 hours. This may represent progression in multifocal  pneumonia and septic emboli versus an element of superimposed ARDS. Electronically Signed   By: Rubye Oaks M.D.   On: 08/30/2017 01:55   Dg Chest Portable 1 View  Result Date: 09/04/2017 CLINICAL DATA:  Chest pain EXAM: PORTABLE CHEST 1 VIEW COMPARISON:  09/28/2016, 09/27/2016, 09/26/2016, CT chest 09/22/2016 FINDINGS: Hyperinflation with emphysematous disease and bullous change in the apices. Increased interstitial opacity at the bases. Multifocal opacities within the bilateral lungs. Somewhat nodular configuration in the left lower lung. Stable cardiomediastinal silhouette. No pneumothorax. IMPRESSION: Hyperinflation with marked bullous emphysematous disease. Multifocal airspace disease in left greater than right may reflect pneumonia. Left lower lung focal airspace disease is somewhat nodular in configuration, radiographic follow-up to resolution recommended to exclude pulmonary mass lesion. Electronically Signed   By: Jasmine Pang M.D.   On: 08/15/2017 18:02     STUDIES:  CXR 8/23 > Hyperinflation with marked bullous emphysematous disease. Multifocal airspace disease in left greater than right may reflect pneumonia. Left lower lung focal airspace disease is somewhat nodular in configuration, radiographic follow-up to resolution recommended to exclude pulmonary mass lesion  CTA Chest 8/23 > No evidence pulmonary embolism. Severe emphysematous changes in the upper lobes. Confluent areas of infiltrate are seen in the upper lobes bilaterally consistent with multifocal pneumonia. However, multiple additional nodular areas of irregular opacification are seen in both lungs, some of which contain central cavitation, highly suspicious for septic emboli. 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 Recommend annual imaging followup by CTA or MRA.   CULTURES: Blood 8/23 > Sputum 8/23 >  U/A 8/23 >   ANTIBIOTICS: Vancomycin 8/23>> Cefepime 8/23 >>    SIGNIFICANT EVENTS: 8/23 > Presents to ED  LINES/TUBES: PIV   DISCUSSION: 47 year old male with PMH of MSSA Endocarditis presents to ED with tachycardia and hypoxia. Found to have central cav  ASSESSMENT / PLAN:  Acute Hypoxic Respiratory Failure in setting of central cavitation in setting of septic emboli with right upper and left upper opacity Severe Emphysematous Disease  H/O RLL Cavitary Pneumonia in setting of Endocarditis  Intubated 8/24. PVRC RR 26 Vt 570 FiO2 60% PEEP 5. Fent 400, precedex 1.4, Neo   Switched to PC FiO2 60 RR 28 PEEP 10 Maintain Oxygen Saturation >92 Trend ABG/CXR Antibiotics as below  Duboneb/Pulmiort/Brovana   Tachycardia in setting of septic shock  Suspected Recurrent Endocarditis with septic emboli  Grade 1 Diastolic Dysfunction (EF 55-60)  H/O MSSA/Pseudomonas bacteremia/Tricuspid Valve Endocarditis   ECHO pending  Consult ID Cont. Cefepime 2g, Vanc PAN Culture pending Trend WBC and monitor for fever Trend LA and PCT    Anion Gap Metabolic Acidosis with Lactic Acidosis  LA 7.21 > 6.42 >> 3.4 Acute Renal Injury  On D/C 9/22 Crt 1.9 >> 2.77 >> 2.36  Trend BMP Replace electrolytes as indicated  Urine Sodium/Creatitine Pending   Hepatitis C with Cirrhosis  NPO  Thrombocytopenia, anemia  H/O LUE Radial Vein DVT 7/24   Trend CBC  Maintain Hbg >7  Heparin SQ for VTE  Hypoglycemia -    Trend Glucose q4h Start TF Dextrose w/NS 75cc/hr 3 hours   Substance Abuse > IV Opana, Heroin  CIWA protocol RASS Goal 0/-1 Wean Precedex to Achieve RASS   VTE: heparin  Diet: NPO  IVF: dextrose 5 w/NS 75cc/hr  FAMILY  - Updates: Girlfriend updated at bedside   - Inter-disciplinary family meet or Palliative Care meeting due by: 09/05/2017     Versie Starks, DO 08/30/2017, 9:31 AM Pager: [phone]

## 2017-08-30 NOTE — Progress Notes (Signed)
Worsening shock over the course of the day. UOP 20-30cc/hr. CVP 12 though. So likely does not need more IVF's. Will add stress dose steroids.   Vent changed from PRVC to PCV earlier today, reportedly due to vent asynchrony. Repeat ABG now shows mixed metabolic (likely from AKI?) and respiratory acidosis. Will give 1 amp bicarb. Will increase RR on vent from 28 to 30.

## 2017-08-30 NOTE — Consult Note (Signed)
[place] for Infectious Disease       Reason for Consult: MSSA bacteremia    Referring Physician: CHAMP autoconsult  Active Problems:   Sepsis ([place])   Septic shock ([place])   . arformoterol  15 mcg Nebulization BID  . budesonide (PULMICORT) nebulizer solution  0.5 mg Nebulization BID  . chlorhexidine gluconate (MEDLINE KIT)  15 mL Mouth Rinse BID  . Chlorhexidine Gluconate Cloth  6 each Topical Daily  . famotidine  20 mg Oral Daily  . heparin  5,000 Units Subcutaneous Q8H  . ipratropium-albuterol  3 mL Nebulization Q6H  . mouth rinse  15 mL Mouth Rinse 10 times per day  . sodium chloride flush  10-40 mL Intracatheter Q12H    Recommendations:  continue with vancomycin TEE  Repeat blood cultures tomorrow  Assessment: He has MRSA bacteremia in the setting of septic emboli, IVDU and history of endocarditis c/w new infective endocarditis.    Antibiotics: Vancomycin day 2  HPI: Johnathan [name] is a 47 y.o. male with active IVDU, history of TV endocarditis on TTE in July 2018 with cultures at that time with both MSSA and Pseudomonas and completed 6 weeks of IV therapy with vancomycin and ceftazidime.  He also has a history of chronic hepatitis B on Entecavir and associated cirrhosis, history of pulmonary emphysema and is now intubated.  No history from the patient and no family available.     CT independently reviewed and emphysema noted  Review of Systems:  Unable to be assessed due to patient factors  History per chart  Past Medical History:  Diagnosis Date  . Substance abuse     Social History   Tobacco Use  . Smoking status: Current Every Day Smoker    Packs/day: 0.50    Years: 15.00    Pack years: 7.50    Types: Cigarettes  . Smokeless tobacco: Never Used  Substance Use Topics  . Alcohol use: No    Comment: seldom once a year   . Drug use: Yes    Types: Other-see comments    Comment: not in last 2 wks -opana crushed & Injected IV 09/12/14    No  family history on file.  unobtainable  Allergies  Allergen Reactions  . Ativan [Lorazepam]     Paradoxical Reaction    Physical Exam: Constitutional: intubated, sedated Vitals:   08/30/17 1151 08/30/17 1212  BP: (!) 94/55   Pulse: (!) 122   Resp: (!) 33   Temp:  99 F (37.2 C)  SpO2: 99%    EYES: anicteric ENMT: +ET Cardiovascular: Cor Tachy Respiratory: respiratory effort on vent; CTA B GI: Bowel sounds are normal, liver is not enlarged, spleen is not enlarged Musculoskeletal: peripheral pulses normal, no pedal edema, no clubbing or cyanosis Skin: negatives: no rash Hematologic: no cervical lad  Lab Results  Component Value Date   WBC 5.2 08/30/2017   HGB 11.3 (L) 08/30/2017   HCT 35.4 (L) 08/30/2017   MCV 79.0 08/30/2017   PLT 37 (L) 08/30/2017    Lab Results  Component Value Date   CREATININE 2.36 (H) 08/30/2017   BUN 58 (H) 08/30/2017   NA 140 08/30/2017   K 3.8 08/30/2017   CL 104 08/30/2017   CO2 23 08/30/2017    Lab Results  Component Value Date   ALT 21 08/30/2017   AST 48 (H) 08/28/2017   ALKPHOS 83 08/31/2017     Microbiology: Recent Results (from the past 240 hour(s))  Blood  culture (routine x 2)     Status: None (Preliminary result)   Collection Time: 08/18/2017  5:30 PM  Result Value Ref Range Status   Specimen Description BLOOD THUMB RIGHT  Final   Special Requests   Final    BOTTLES DRAWN AEROBIC AND ANAEROBIC Blood Culture results may not be optimal due to an inadequate volume of blood received in culture bottles   Culture  Setup Time   Final    GRAM POSITIVE COCCI ANAEROBIC BOTTLE ONLY CRITICAL RESULT CALLED TO, READ BACK BY AND VERIFIED WITH: E. DEJA, PHARMD AT 0930 ON 08/30/17 BY C. JESSUP, MLT. Performed at [facility], [place] [address], [place], [place] [address]    Culture GRAM POSITIVE COCCI  Final   Report Status PENDING  Incomplete  Blood culture (routine x 2)     Status: None (Preliminary result)   Collection Time:  08/22/2017  7:06 PM  Result Value Ref Range Status   Specimen Description BLOOD LEFT HAND  Final   Special Requests   Final    BOTTLES DRAWN AEROBIC AND ANAEROBIC Blood Culture results may not be optimal due to an inadequate volume of blood received in culture bottles   Culture  Setup Time   Final    GRAM POSITIVE COCCI IN BOTH AEROBIC AND ANAEROBIC BOTTLES CRITICAL RESULT CALLED TO, READ BACK BY AND VERIFIED WITH: E. DEJA, PHARMD AT 0930 ON 08/30/17 BY C. JESSUP, MLT. Performed at [facility], [place] [address], [place], [place] [address]    Culture GRAM POSITIVE COCCI  Final   Report Status PENDING  Incomplete  Blood Culture ID Panel (Reflexed)     Status: Abnormal   Collection Time: 08/19/2017  7:06 PM  Result Value Ref Range Status   Enterococcus species NOT DETECTED NOT DETECTED Final   Listeria monocytogenes NOT DETECTED NOT DETECTED Final   Staphylococcus species DETECTED (A) NOT DETECTED Final    Comment: CRITICAL RESULT CALLED TO, READ BACK BY AND VERIFIED WITH: E. DEJA, PHARMD AT 0930 ON 08/30/17 BY C. JESSUP, MLT.    Staphylococcus aureus DETECTED (A) NOT DETECTED Final    Comment: Methicillin (oxacillin)-resistant Staphylococcus aureus (MRSA). MRSA is predictably resistant to beta-lactam antibiotics (except ceftaroline). Preferred therapy is vancomycin unless clinically contraindicated. Patient requires contact precautions if  hospitalized. CRITICAL RESULT CALLED TO, READ BACK BY AND VERIFIED WITH: E. DEJA, PHARMD AT 0930 ON 08/30/17 BY C. JESSUP, MLT.    Methicillin resistance DETECTED (A) NOT DETECTED Final    Comment: CRITICAL RESULT CALLED TO, READ BACK BY AND VERIFIED WITH: E. DEJA, PHARMD AT 0930 ON 08/30/17 BY C. JESSUP, MLT.    Streptococcus species NOT DETECTED NOT DETECTED Final   Streptococcus agalactiae NOT DETECTED NOT DETECTED Final   Streptococcus pneumoniae NOT DETECTED NOT DETECTED Final   Streptococcus pyogenes NOT DETECTED NOT DETECTED Final    Acinetobacter baumannii NOT DETECTED NOT DETECTED Final   Enterobacteriaceae species NOT DETECTED NOT DETECTED Final   Enterobacter cloacae complex NOT DETECTED NOT DETECTED Final   Escherichia coli NOT DETECTED NOT DETECTED Final   Klebsiella oxytoca NOT DETECTED NOT DETECTED Final   Klebsiella pneumoniae NOT DETECTED NOT DETECTED Final   Proteus species NOT DETECTED NOT DETECTED Final   Serratia marcescens NOT DETECTED NOT DETECTED Final   Haemophilus influenzae NOT DETECTED NOT DETECTED Final   Neisseria meningitidis NOT DETECTED NOT DETECTED Final   Pseudomonas aeruginosa NOT DETECTED NOT DETECTED Final   Candida albicans NOT DETECTED NOT DETECTED  Final   Candida glabrata NOT DETECTED NOT DETECTED Final   Candida krusei NOT DETECTED NOT DETECTED Final   Candida parapsilosis NOT DETECTED NOT DETECTED Final   Candida tropicalis NOT DETECTED NOT DETECTED Final    Comment: Performed at [facility], [place] [address] [place], [place] [address]  MRSA PCR Screening     Status: Abnormal   Collection Time: 08/10/2017 10:09 PM  Result Value Ref Range Status   MRSA by PCR POSITIVE (A) NEGATIVE Final    Comment:        The GeneXpert MRSA Assay (FDA approved for NASAL specimens only), is one component of a comprehensive MRSA colonization surveillance program. It is not intended to diagnose MRSA infection nor to guide or monitor treatment for MRSA infections. RESULT CALLED TO, READ BACK BY AND VERIFIED WITHWillis Modena RN [id number] 08/30/17 A BROWNING Performed at [facility], [place]  [address] [place], [place] [address]     Anzleigh Slaven W Satina Jerrell, [place] for Infectious Disease [facility] [email or web address] [phone] pager  [phone] cell 08/30/2017, 12:20 PM

## 2017-08-30 NOTE — Progress Notes (Signed)
PHARMACY - PHYSICIAN COMMUNICATION CRITICAL VALUE ALERT - BLOOD CULTURE IDENTIFICATION (BCID)  Jennet MaduroChristopher Tupper is an 47 y.o. male who [facility] with a chief complaint of fever, chills, productive cough. PMH s/f IVDU, MSSA bacteremia and endocarditis. 3/4 BCx now growing GPC in clusters, BCID MRSA.  Name of physician (or Provider) Contacted: [place]  Current antibiotics: Vancomycin + cefepime  Changes to prescribed antibiotics recommended:  Discontinue cefepime. Recommendations accepted by provider.  Results for orders placed or performed during the hospital encounter of 08/08/2017  Blood Culture ID Panel (Reflexed) (Collected: 08/28/2017  7:06 PM)  Result Value Ref Range   Enterococcus species NOT DETECTED NOT DETECTED   Listeria monocytogenes NOT DETECTED NOT DETECTED   Staphylococcus species DETECTED (A) NOT DETECTED   Staphylococcus aureus DETECTED (A) NOT DETECTED   Methicillin resistance DETECTED (A) NOT DETECTED   Streptococcus species NOT DETECTED NOT DETECTED   Streptococcus agalactiae NOT DETECTED NOT DETECTED   Streptococcus pneumoniae NOT DETECTED NOT DETECTED   Streptococcus pyogenes NOT DETECTED NOT DETECTED   Acinetobacter baumannii NOT DETECTED NOT DETECTED   Enterobacteriaceae species NOT DETECTED NOT DETECTED   Enterobacter cloacae complex NOT DETECTED NOT DETECTED   Escherichia coli NOT DETECTED NOT DETECTED   Klebsiella oxytoca NOT DETECTED NOT DETECTED   Klebsiella pneumoniae NOT DETECTED NOT DETECTED   Proteus species NOT DETECTED NOT DETECTED   Serratia marcescens NOT DETECTED NOT DETECTED   Haemophilus influenzae NOT DETECTED NOT DETECTED   Neisseria meningitidis NOT DETECTED NOT DETECTED   Pseudomonas aeruginosa NOT DETECTED NOT DETECTED   Candida albicans NOT DETECTED NOT DETECTED   Candida glabrata NOT DETECTED NOT DETECTED   Candida krusei NOT DETECTED NOT DETECTED   Candida parapsilosis NOT DETECTED NOT DETECTED   Candida  tropicalis NOT DETECTED NOT DETECTED   Tyreshia Ingman N. Zigmund Danieleja, PharmD PGY2 Infectious Diseases Pharmacy Resident Phone: [phone] 08/30/2017  9:37 AM

## 2017-08-30 NOTE — Procedures (Signed)
Central Venous Catheter Insertion Procedure Note Johnathan MaduroChristopher [name] [id number] 1970-09-08  Procedure: Insertion of Central Venous Catheter Indications: Assessment of intravascular volume, Drug and/or fluid administration and Frequent blood sampling  Procedure Details Consent: Unable to obtain consent because of emergent medical necessity. Time Out: Verified patient identification, verified procedure, site/side was marked, verified correct patient position, special equipment/implants available, medications/allergies/relevent history reviewed, required imaging and test results available.  Performed  Maximum sterile technique was used including antiseptics, cap, gloves, gown, hand hygiene, mask and sheet. Skin prep: Chlorhexidine; local anesthetic administered A antimicrobial bonded/coated triple lumen catheter was placed in the right internal jugular vein using the Seldinger technique.  Evaluation Blood flow good Complications: No apparent complications Patient did tolerate procedure well. Chest X-ray ordered to verify placement.  CXR: pending.  Johnathan KussmaulKatalina Jordi [name], AGACNP-BC [facility]  Pgr: [phone]  PCCM Pgr: [phone]

## 2017-08-30 NOTE — Procedures (Signed)
Endotracheal Intubation Procedure Note Indication for endotracheal intubation: impending respiratory failure Sedation: etomidate and midazolam Paralytic: succinylcholine Equipment: Macintosh 3 laryngoscope blade and 7.355mm cuffed endotracheal tube; Secured 24cm at the gum Cricoid Pressure: no Number of attempts: 1 ETT location confirmed by by auscultation, by CXR and ETCO2 monitor. Patient tolerated procedure well.

## 2017-08-30 NOTE — Progress Notes (Addendum)
Initial Nutrition Assessment  DOCUMENTATION CODES:   Not applicable  INTERVENTION:    Initiate TF via OGT with Vital AF 1.2 at goal rate of 75 ml/h (1800 ml per day)   Provides 2160 kcals, 135 gm protein, 1459 ml free water daily  NUTRITION DIAGNOSIS:   Inadequate oral intake related to inability to eat as evidenced by NPO status  GOAL:   Patient will meet greater than or equal to 90% of their needs  MONITOR:   TF tolerance, Vent status, Labs, Skin, Weight trends, I & O's  REASON FOR ASSESSMENT:   Consult Enteral/tube feeding initiation and management  ASSESSMENT:   47 year old male with PMH of MSSA endocarditis/bacteremia, IVDU, hepatitis C with cirrhosis, CKD; admitted with fever, chills, and productive cough with brown-like sputum.  Pt is currently intubated on ventilator support MV: 19.1 L/min Temp (24hrs), Avg:98.6 F (37 C), Min:97.5 F (36.4 C), Max:99.8 F (37.7 C)  OGT in place  Pt admitted with sepsis due to pneumonia. RD unable to speak with pt. His girlfriend nor family are at bedside. Pt familiar to this RD with assessment during previous hospitalization.  He has a hx of malnutrition identification. Highly suspect ongoing. Medications include Versed, Levophed, IV ABX and Colace. Labs reviewed. Mg 1.3 (L). CBG's [id number].  NUTRITION - FOCUSED PHYSICAL EXAM:  Deffered due to tenuous status. RN in and out of room.  Diet Order:   Diet Order            Diet NPO time specified  Diet effective now             EDUCATION NEEDS:   Not appropriate for education at this time  Skin:  Skin Assessment: Reviewed RN Assessment  Last BM:  PTA  Height:   Ht Readings from Last 1 Encounters:  16-Sep-2017 5\' 9"  (1.753 m)    Weight:   Wt Readings from Last 1 Encounters:  08/30/17 71.3 kg    Intake/Output Summary (Last 24 hours) at 08/30/2017 1151 Last data filed at 08/30/2017 0900 Gross per 24 hour  Intake 4829.71 ml  Output 700 ml  Net  4129.71 ml   BMI:  Body mass index is 23.21 kg/m.  Estimated Nutritional Needs:   Kcal:  2197  Protein:  120-135 gm  Fluid:  per MD  Maureen ChattersKatie Donell Tomkins, RD, LDN Pager #: [phone] After-Hours Pager #: [phone]

## 2017-08-30 NOTE — Progress Notes (Signed)
ABG post intubation shows mixed respiratory and metabolic acidosis. RT increased RR from 24 to 26. Will give 1 amp bicarb. Repeat ABG at 5am.

## 2017-08-30 NOTE — Procedures (Signed)
Arterial Catheter Insertion Procedure Note Johnathan MaduroChristopher [name] [id number] 10-11-1970  Procedure: Insertion of Arterial Catheter  Indications: Blood pressure monitoring and Frequent blood sampling  Procedure Details Consent: Unable to obtain consent because of altered level of consciousness. Time Out: Verified patient identification, verified procedure, site/side was marked, verified correct patient position, special equipment/implants available, medications/allergies/relevent history reviewed, required imaging and test results available.  Performed  Maximum sterile technique was used including antiseptics, cap, gloves, gown, hand hygiene, mask and sheet. Skin prep: Chlorhexidine; local anesthetic administered 20 gauge catheter was inserted into right radial artery using the Seldinger technique. ULTRASOUND GUIDANCE USED: NO Evaluation Blood flow good; BP tracing good. Complications: No apparent complications.   Johnathan BoundYACOUB,Johnathan [name] 08/30/2017

## 2017-08-30 NOTE — Progress Notes (Signed)
After much effort trying to get POA paperwork finalized, it was discovered that patient did not have ID on him. Therefore the paperwork could not be completed. Patient had previously told me not to contact his mother. However now he says if needed, I can contact either of his parents to obtain consent for procedures.

## 2017-08-31 ENCOUNTER — Inpatient Hospital Stay (HOSPITAL_COMMUNITY): Payer: Medicaid Other

## 2017-08-31 ENCOUNTER — Encounter (HOSPITAL_COMMUNITY): Payer: Self-pay | Admitting: *Deleted

## 2017-08-31 DIAGNOSIS — I330 Acute and subacute infective endocarditis: Secondary | ICD-10-CM

## 2017-08-31 LAB — BLOOD GAS, ARTERIAL
Acid-base deficit: 3.4 mmol/L — ABNORMAL HIGH (ref 0.0–2.0)
Bicarbonate: 21.4 mmol/L (ref 20.0–28.0)
DRAWN BY: 51702
FIO2: 50
O2 Saturation: 98.5 %
PEEP: 10 cmH2O
PH ART: 7.342 — AB (ref 7.350–7.450)
Patient temperature: 98.6
Pressure control: 20 cmH2O
RATE: 30 resp/min
pCO2 arterial: 40.5 mmHg (ref 32.0–48.0)
pO2, Arterial: 138 mmHg — ABNORMAL HIGH (ref 83.0–108.0)

## 2017-08-31 LAB — GLUCOSE, CAPILLARY
GLUCOSE-CAPILLARY: 193 mg/dL — AB (ref 70–99)
GLUCOSE-CAPILLARY: 217 mg/dL — AB (ref 70–99)
GLUCOSE-CAPILLARY: 218 mg/dL — AB (ref 70–99)
Glucose-Capillary: 158 mg/dL — ABNORMAL HIGH (ref 70–99)
Glucose-Capillary: 169 mg/dL — ABNORMAL HIGH (ref 70–99)
Glucose-Capillary: 185 mg/dL — ABNORMAL HIGH (ref 70–99)
Glucose-Capillary: 204 mg/dL — ABNORMAL HIGH (ref 70–99)

## 2017-08-31 LAB — CBC WITH DIFFERENTIAL/PLATELET
BLASTS: 0 %
Band Neutrophils: 9 %
Basophils Absolute: 0 10*3/uL (ref 0.0–0.1)
Basophils Relative: 0 %
Eosinophils Absolute: 0.2 10*3/uL (ref 0.0–0.7)
Eosinophils Relative: 2 %
HCT: 32.3 % — ABNORMAL LOW (ref 39.0–52.0)
Hemoglobin: 10.6 g/dL — ABNORMAL LOW (ref 13.0–17.0)
LYMPHS PCT: 15 %
Lymphs Abs: 1.4 10*3/uL (ref 0.7–4.0)
MCH: 25.8 pg — AB (ref 26.0–34.0)
MCHC: 32.8 g/dL (ref 30.0–36.0)
MCV: 78.6 fL (ref 78.0–100.0)
MONOS PCT: 8 %
Metamyelocytes Relative: 0 %
Monocytes Absolute: 0.7 10*3/uL (ref 0.1–1.0)
Myelocytes: 0 %
NEUTROS ABS: 7 10*3/uL (ref 1.7–7.7)
Neutrophils Relative %: 66 %
OTHER: 0 %
Platelets: 44 10*3/uL — ABNORMAL LOW (ref 150–400)
Promyelocytes Relative: 0 %
RBC: 4.11 MIL/uL — AB (ref 4.22–5.81)
RDW: 16.3 % — AB (ref 11.5–15.5)
WBC: 9.3 10*3/uL (ref 4.0–10.5)
nRBC: 0 /100 WBC

## 2017-08-31 LAB — PROCALCITONIN: PROCALCITONIN: 23.36 ng/mL

## 2017-08-31 LAB — ALPHA-1-ANTITRYPSIN: A-1 Antitrypsin, Ser: 305 mg/dL — ABNORMAL HIGH (ref 90–200)

## 2017-08-31 LAB — COMPREHENSIVE METABOLIC PANEL
ALBUMIN: 1.7 g/dL — AB (ref 3.5–5.0)
ALT: 24 U/L (ref 0–44)
AST: 63 U/L — ABNORMAL HIGH (ref 15–41)
Alkaline Phosphatase: 55 U/L (ref 38–126)
Anion gap: 11 (ref 5–15)
BUN: 81 mg/dL — ABNORMAL HIGH (ref 6–20)
CHLORIDE: 107 mmol/L (ref 98–111)
CO2: 21 mmol/L — AB (ref 22–32)
CREATININE: 2.5 mg/dL — AB (ref 0.61–1.24)
Calcium: 7 mg/dL — ABNORMAL LOW (ref 8.9–10.3)
GFR calc Af Amer: 34 mL/min — ABNORMAL LOW (ref >60)
GFR calc non Af Amer: 29 mL/min — ABNORMAL LOW (ref >60)
GLUCOSE: 222 mg/dL — AB (ref 70–99)
Potassium: 4.3 mmol/L (ref 3.5–5.1)
SODIUM: 139 mmol/L (ref 135–145)
Total Bilirubin: 2.7 mg/dL — ABNORMAL HIGH (ref 0.3–1.2)
Total Protein: 5.7 g/dL — ABNORMAL LOW (ref 6.5–8.1)

## 2017-08-31 LAB — PHOSPHORUS: PHOSPHORUS: 4.3 mg/dL (ref 2.5–4.6)

## 2017-08-31 LAB — ECHOCARDIOGRAM COMPLETE
Height: 69 in
Weight: 2585.55 oz

## 2017-08-31 LAB — URINE CULTURE: Culture: NO GROWTH

## 2017-08-31 LAB — MAGNESIUM: Magnesium: 2.3 mg/dL (ref 1.7–2.4)

## 2017-08-31 MED ORDER — INSULIN ASPART 100 UNIT/ML ~~LOC~~ SOLN
0.0000 [IU] | SUBCUTANEOUS | Status: DC
  Administered 2017-08-31: 5 [IU] via SUBCUTANEOUS
  Administered 2017-08-31 (×2): 3 [IU] via SUBCUTANEOUS
  Administered 2017-08-31 (×2): 5 [IU] via SUBCUTANEOUS
  Administered 2017-08-31 (×2): 3 [IU] via SUBCUTANEOUS
  Administered 2017-09-01 (×3): 2 [IU] via SUBCUTANEOUS

## 2017-08-31 NOTE — Progress Notes (Signed)
PULMONARY / CRITICAL CARE MEDICINE   Name: Johnathan [name] MRN: [id number] DOB: 28-Nov-1970    ADMISSION DATE:  2017/09/12 CONSULTATION DATE:  09-12-17  REFERRING MD:  Dr. Rush Landmark   CHIEF COMPLAINT:  Sepsis   HISTORY OF PRESENT ILLNESS:   47 year old male with PMH of MSSA Endocarditis/Bacteremia, IV Drug Abuse with Heroin/IV Opana, Hepatitis C with Cirrhosis, CKD, H/O DVT   Presents to ED on 8/23 with fever, chills, and productive cough with brown-like sputum. States he continues to use IV Heroin with most recent 3 days ago. Upon arrival to ED patient is tachycardiac with HR 150-160, Temp 99, RR 30-40, and Hypoxia with oxygen saturation 70-80. LA 7.21, CXR concerning for multifocal pneumonia. CTA negative for PE, however multiple nodular areas of irregular opacification in both lungs concerning for septic emboli. Given 3L Bolus and Cefepime/Vancomycin. Due to progressive hypoxia attempted BiPAP however patient did not tolerate due to claustrophobia. PCCM asked to consult.     SUBJECTIVE:  On PC, synchronous with vent. On pressors & sedation with precedex/fentanyl   VITAL SIGNS: BP 90/66 (BP Location: Left Arm)   Pulse (!) 110   Temp 99.1 F (37.3 C) (Oral)   Resp (!) 31   Ht 5\' 9"  (1.753 m)   Wt 73.3 kg   SpO2 99%   BMI 23.86 kg/m   VENTILATOR SETTINGS: Vent Mode: PCV FiO2 (%):  [45 %-60 %] 45 % Set Rate:  [26 bmp-30 bmp] 30 bmp Vt Set:  [570 mL] 570 mL PEEP:  [5 cmH20-10 cmH20] 10 cmH20 Plateau Pressure:  [16 cmH20-26 cmH20] 26 cmH20  INTAKE / OUTPUT: I/O last 3 completed shifts: In: 40981 [I.V.:5358; NG/GT:1160; IV Piggyback:4069.9] Out: 1452 [Urine:1452]  PHYSICAL EXAMINATION:   Constitution: acutely ill appearing, appears older than stated age HEENT: AT/[place], right IJ, ET/OGT in place Cardio: tachycardic, regular rhythm, no m/r/g Respiratory: labored breathing, coarse breath sounds right Abdominal: hypoactive bs, soft, non-distended MSK: non-pitting edema  resent GU: foley Neuro: sedated, intubated Skin: c/d/i    LABS:  BMET Recent Labs  Lab September 12, 2017 1907 08/30/17 0342 08/31/17 0349  NA 136 140 139  K 3.8 3.8 4.3  CL 98 104 107  CO2 20* 23 21*  BUN 56* 58* 81*  CREATININE 2.77* 2.36* 2.50*  GLUCOSE 62* 71 222*   Electrolytes Recent Labs  Lab 09-12-17 1907 09/12/17 2333 08/30/17 0342 08/31/17 0349  CALCIUM 7.7*  --  7.1* 7.0*  MG  --  1.3* 1.8 2.3  PHOS  --  3.0  --  4.3   CBC Recent Labs  Lab 09-12-17 1730 08/30/17 0342 08/31/17 0349  WBC 8.1 5.2 9.3  HGB 14.1 11.3* 10.6*  HCT 45.0 35.4* 32.3*  PLT 77* 37* 44*   Coag's No results for input(s): APTT, INR in the last 168 hours.  Sepsis Markers Recent Labs  Lab 2017/09/12 2008 September 12, 2017 2333 08/30/17 0341 08/30/17 0342 08/31/17 0349  LATICACIDVEN 6.42* 5.8* 3.4*  --   --   PROCALCITON  --  8.05  --  8.94 23.36   ABG Recent Labs  Lab 08/30/17 0904 08/30/17 2001 08/31/17 0316  PHART 7.261* 7.275* 7.342*  PCO2ART 51.7* 45.9 40.5  PO2ART 79.0* 116* 138*   Liver Enzymes Recent Labs  Lab 2017/09/12 1907 08/31/17 0349  AST 48* 63*  ALT 21 24  ALKPHOS 83 55  BILITOT 2.3* 2.7*  ALBUMIN 2.3* 1.7*   Cardiac Enzymes Recent Labs  Lab 12-Sep-2017 2333 08/30/17 0342 08/30/17 1524  TROPONINI 0.04* 0.04*  0.05*   Glucose Recent Labs  Lab 08/30/17 1206 08/30/17 1517 08/30/17 1953 08/31/17 0012 08/31/17 0357 08/31/17 0718  GLUCAP 90 106* 143* 185* 193* 158*   Imaging No results found. STUDIES:  CXR 8/23 > Hyperinflation with marked bullous emphysematous disease. Multifocal airspace disease in left greater than right may reflect pneumonia. Left lower lung focal airspace disease is somewhat nodular in configuration, radiographic follow-up to resolution recommended to exclude pulmonary mass lesion CTA Chest 8/23 > No evidence pulmonary embolism. Severe emphysematous changes in the upper lobes. Confluent areas of infiltrate are seen in the upper  lobes bilaterally consistent with multifocal pneumonia. However, multiple additional nodular areas of irregular opacification are seen in both lungs, some of which contain central cavitation, highly suspicious for septic emboli. 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 Recommend annual imaging followup by CTA or MRA.   . sodium chloride 10 mL/hr at 08/31/17 0700  . dexmedetomidine (PRECEDEX) IV infusion 1.4 mcg/kg/hr (08/31/17 0700)  . feeding supplement (VITAL AF 1.2 CAL) 75 mL/hr at 08/31/17 0700  . fentaNYL infusion INTRAVENOUS 400 mcg/hr (08/31/17 0700)  . norepinephrine (LEVOPHED) Adult infusion 15 mcg/min (08/31/17 0700)  . phenylephrine (NEO-SYNEPHRINE) Adult infusion Stopped (08/31/17 0303)  . vancomycin Stopped (08/30/17 1903)  . vasopressin (PITRESSIN) infusion - *FOR SHOCK* 0.03 Units/min (08/31/17 0700)    CULTURES: Blood 8/23 > +MRSA Sputum 8/23 >  U/A 8/23 >  Blood 8/24 >   ANTIBIOTICS: Vancomycin 8/23>> Cefepime 8/23 >> 8/24  SIGNIFICANT EVENTS: 8/23 > Presents to ED  LINES/TUBES: PIV   DISCUSSION: 47 year old male with PMH of MRSA Endocarditis presents to ED with tachycardia and hypoxia. Positive MRSA blood cultures 8/24. Found on CT to have apical emphysematous disease and central cavitary lesions significant for likely septic emobli.   ASSESSMENT / PLAN:  Acute Hypoxic Respiratory Failure in setting of central cavitation in setting of septic emboli with right upper and left upper opacity Severe Emphysematous Disease  H/O RLL Cavitary Pneumonia in setting of Endocarditis  Intubated 8/24. PC RR 26 Vt 570 FiO2 45% PEEP 20/10. Fent 400, precedex 1.4, Levo 15mcg & vaso .03 units. CXR with decrease pleural effusion on right.   Attempted decreased sedation with increasing tachypnea/nasal flaring - cont. Sedation  Cont. PC ventilation  Titrate O2 for sat of 88-92% alpha-1-anti-trypsin level pending Trend  ABG/CXR Antibiotics as below  Duboneb/Pulmiort/Brovana   Tachycardia in setting of septic shock  MRSA Endocarditis with septic emboli  Grade 1 Diastolic Dysfunction (EF 55-60)  H/O MRSA/Pseudomonas bacteremia/Tricuspid Valve Endocarditis   2D echo pending - not done yesterday  TEE? Consult ID for endocarditis Continue vancomycin Trend WBC and monitor for fever Trend PCT    Anion Gap Metabolic Acidosis with Lactic Acidosis  LA 7.21 > 6.42 >> 3.4 Acute Renal Injury  On D/C 9/22 Crt 1.9 >> 2.77 >> 2.36 >> 2.5 UCr: 128, Una: 19  Trend BMP Replace electrolytes as indicated  Urine Sodium/Creatitine Pending   Hepatitis C with Cirrhosis  Start TF per nutrition  Thrombocytopenia, anemia with H/O LUE Radial Vein DVT 7/24: worsening - HIT risk <5% Likely secondary to endocarditis. Would D/c heparin but hx of recent DVT 7/24  Trend CBC  Maintain Hbg >7  Transfuse for platelets <20  Hypoglycemia -    Trend Glucose q4h Start TF   Substance Abuse > IV Opana, Heroin  CIWA protocol RASS Goal 0/-1 Wean Precedex to Achieve RASS   VTE: heparin, SCDs  Diet: NPO  IVF: dextrose 5 w/NS 75cc/hr  FAMILY  - Updates: No family bedside.  Insert a-line.  Change to levophed, adjust abx  - Inter-disciplinary family meet or Palliative Care meeting due by: 09/05/2017    Versie Starks, DO 08/31/2017, 8:04 AM Pager: [phone]

## 2017-08-31 NOTE — Progress Notes (Signed)
PULMONARY / CRITICAL CARE MEDICINE   Name: Johnathan [name] MRN: [id number] DOB: January 16, 1970    ADMISSION DATE:  08/20/2017 CONSULTATION DATE:  08/10/2017  REFERRING MD:  Dr. Rush Landmark   CHIEF COMPLAINT:  Sepsis   HISTORY OF PRESENT ILLNESS:   47 year old male with PMH of MSSA Endocarditis/Bacteremia, IV Drug Abuse with Heroin/IV Opana, Hepatitis C with Cirrhosis, CKD, H/O DVT   Presents to ED on 8/23 with fever, chills, and productive cough with brown-like sputum. States he continues to use IV Heroin with most recent 3 days ago. Upon arrival to ED patient is tachycardiac with HR 150-160, Temp 99, RR 30-40, and Hypoxia with oxygen saturation 70-80. LA 7.21, CXR concerning for multifocal pneumonia. CTA negative for PE, however multiple nodular areas of irregular opacification in both lungs concerning for septic emboli. Given 3L Bolus and Cefepime/Vancomycin. Due to progressive hypoxia attempted BiPAP however patient did not tolerate due to claustrophobia. PCCM asked to consult.     SUBJECTIVE:  Tachypnea Unresponsive No events overnight  VITAL SIGNS: BP 99/75   Pulse (!) 110   Temp 99.1 F (37.3 C) (Oral)   Resp (!) 31   Ht 5\' 9"  (1.753 m)   Wt 73.3 kg   SpO2 99%   BMI 23.86 kg/m   VENTILATOR SETTINGS: Vent Mode: PCV FiO2 (%):  [45 %-60 %] 45 % Set Rate:  [28 bmp-30 bmp] 30 bmp PEEP:  [10 cmH20] 10 cmH20 Plateau Pressure:  [25 cmH20-26 cmH20] 26 cmH20  INTAKE / OUTPUT: I/O last 3 completed shifts: In: 40981 [I.V.:5358; NG/GT:1160; IV Piggyback:4069.9] Out: 1452 [Urine:1452]  PHYSICAL EXAMINATION:   Constitution: Acutely ill appearing male, respiratory distress HEENT: [place]/AT, PERRL, EOM-spontaneous Cardio: Sinus tach, Nl S1/S2 and -M/R/G. Respiratory: Labored breathing, diffuse crackles Abdominal: Soft, NT, ND and +BS MSK: -edema and -tenderness GU: foley Neuro: sedated, intubated Skin: c/d/i   LABS:  BMET Recent Labs  Lab 08/13/2017 1907 08/30/17 0342  08/31/17 0349  NA 136 140 139  K 3.8 3.8 4.3  CL 98 104 107  CO2 20* 23 21*  BUN 56* 58* 81*  CREATININE 2.77* 2.36* 2.50*  GLUCOSE 62* 71 222*    Electrolytes Recent Labs  Lab 08/10/2017 1907 08/08/2017 2333 08/30/17 0342 08/31/17 0349  CALCIUM 7.7*  --  7.1* 7.0*  MG  --  1.3* 1.8 2.3  PHOS  --  3.0  --  4.3   CBC Recent Labs  Lab 08/13/2017 1730 08/30/17 0342 08/31/17 0349  WBC 8.1 5.2 9.3  HGB 14.1 11.3* 10.6*  HCT 45.0 35.4* 32.3*  PLT 77* 37* 44*   Coag's No results for input(s): APTT, INR in the last 168 hours.  Sepsis Markers Recent Labs  Lab 08/21/2017 2008 08/18/2017 2333 08/30/17 0341 08/30/17 0342 08/31/17 0349  LATICACIDVEN 6.42* 5.8* 3.4*  --   --   PROCALCITON  --  8.05  --  8.94 23.36   ABG Recent Labs  Lab 08/30/17 0904 08/30/17 2001 08/31/17 0316  PHART 7.261* 7.275* 7.342*  PCO2ART 51.7* 45.9 40.5  PO2ART 79.0* 116* 138*   Liver Enzymes Recent Labs  Lab 08/19/2017 1907 08/31/17 0349  AST 48* 63*  ALT 21 24  ALKPHOS 83 55  BILITOT 2.3* 2.7*  ALBUMIN 2.3* 1.7*   Cardiac Enzymes Recent Labs  Lab 09/04/2017 2333 08/30/17 0342 08/30/17 1524  TROPONINI 0.04* 0.04* 0.05*   Glucose Recent Labs  Lab 08/30/17 1206 08/30/17 1517 08/30/17 1953 08/31/17 0012 08/31/17 0357 08/31/17 0718  GLUCAP 90 106*  143* 185* 193* 158*   Imaging Dg Chest Port 1 View  Result Date: 08/31/2017 CLINICAL DATA:  Intubated EXAM: PORTABLE CHEST 1 VIEW COMPARISON:  Chest radiograph from one day prior. FINDINGS: Endotracheal tube tip is 2.4 cm above the carina. Enteric tube enters stomach with the tip not seen on this image. Right internal jugular central venous catheter terminates in the middle third of the SVC. Stable cardiomediastinal silhouette with normal heart size. No pneumothorax. No pleural effusion. Extensive patchy consolidation throughout both lungs, not appreciably changed. IMPRESSION: 1. Well-positioned support structures. 2. Stable extensive  patchy consolidation throughout both lungs compatible with multilobar pneumonia. Electronically Signed   By: Delbert Phenix M.D.   On: 08/31/2017 08:04   STUDIES:  CXR 8/23 > Hyperinflation with marked bullous emphysematous disease. Multifocal airspace disease in left greater than right may reflect pneumonia. Left lower lung focal airspace disease is somewhat nodular in configuration, radiographic follow-up to resolution recommended to exclude pulmonary mass lesion CTA Chest 8/23 > No evidence pulmonary embolism. Severe emphysematous changes in the upper lobes. Confluent areas of infiltrate are seen in the upper lobes bilaterally consistent with multifocal pneumonia. However, multiple additional nodular areas of irregular opacification are seen in both lungs, some of which contain central cavitation, highly suspicious for septic emboli. 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 Recommend annual imaging followup by CTA or MRA.   . sodium chloride 10 mL/hr at 08/31/17 0700  . dexmedetomidine (PRECEDEX) IV infusion 1.4 mcg/kg/hr (08/31/17 0837)  . feeding supplement (VITAL AF 1.2 CAL) 75 mL/hr at 08/31/17 0700  . fentaNYL infusion INTRAVENOUS 400 mcg/hr (08/31/17 0700)  . norepinephrine (LEVOPHED) Adult infusion 13 mcg/min (08/31/17 0911)  . phenylephrine (NEO-SYNEPHRINE) Adult infusion Stopped (08/31/17 0303)  . vancomycin Stopped (08/30/17 1903)  . vasopressin (PITRESSIN) infusion - *FOR SHOCK* 0.03 Units/min (08/31/17 0700)    CULTURES: Blood 8/23 > +MRSA Sputum 8/23 >  U/A 8/23 >  Blood 8/24 >   ANTIBIOTICS: Vancomycin 8/23>> Cefepime 8/23 >> 8/24  SIGNIFICANT EVENTS: 8/23 > Presents to ED  LINES/TUBES: PIV   DISCUSSION: 47 year old male with PMH of MRSA Endocarditis presents to ED with tachycardia and hypoxia. Positive MRSA blood cultures 8/24. Found on CT to have apical emphysematous disease and central cavitary lesions significant  for likely septic emobli.   ASSESSMENT / PLAN:  Acute Hypoxic Respiratory Failure in setting of central cavitation in setting of septic emboli with right upper and left upper opacity Severe Emphysematous Disease  H/O RLL Cavitary Pneumonia in setting of Endocarditis  Intubated 8/24. PC RR 26 Vt 570 FiO2 45% PEEP 20/10. Fent 400, precedex 1.4, Levo & vaso .03 units. CXR with decrease pleural effusion on right.   Attempted decreased sedation with increasing tachypnea/nasal flaring - cont. Sedation Decrease PEEP to 5 and FiO2 to 40% Titrate O2 for sat 88-92% Alpha-1-anti-trypsin level pending Trend ABG/CXR Antibiotics as below  Duboneb/Pulmiort/Brovana   Tachycardia in setting of septic shock  MRSA Endocarditis with septic emboli  Grade 1 Diastolic Dysfunction (EF 55-60)  H/O MRSA/Pseudomonas bacteremia/Tricuspid Valve Endocarditis   2D echo done and pending TEE? Consult ID for endocarditis Continue vancomycin as ordered Trend WBC and monitor for fever Trend PCT  Anion Gap Metabolic Acidosis with Lactic Acidosis  LA 7.21 > 6.42 >> 3.4 Acute Renal Injury  On D/C 9/22 Crt 1.9 >> 2.77 >> 2.36 >> 2.5 UCr: 128, Una: 19  BMET in AM Replace electrolytes as indicated  Urine Sodium/Creatitine Pending  Hepatitis C with Cirrhosis  TF per nutrition  Thrombocytopenia, anemia with H/O LUE Radial Vein DVT 7/24: worsening - HIT risk <5% Likely secondary to endocarditis. Would D/c heparin but hx of recent DVT 7/24  Trend CBC  Maintain Hbg >7  Transfuse platelet if bleeding  Hypoglycemia -    Trend Glucose q4h TF per nutrition   Substance Abuse > IV Opana, Heroin  CIWA protocol RASS Goal 0/-1 Wean Precedex to Achieve RASS   VTE: heparin, SCDs  Diet: NPO  IVF: dextrose 5 w/NS 75cc/hr  FAMILY  - Updates: No family bedside  - Inter-disciplinary family meet or Palliative Care meeting due by: 09/05/2017   The patient is critically ill with multiple organ systems failure  and requires high complexity decision making for assessment and support, frequent evaluation and titration of therapies, application of advanced monitoring technologies and extensive interpretation of multiple databases.   Critical Care Time devoted to patient care services described in this note is  36  Minutes. This time reflects time of care of this signee Dr Koren BoundWesam Yacoub. This critical care time does not reflect procedure time, or teaching time or supervisory time of PA/NP/Med student/Med Resident etc but could involve care discussion time.  Alyson ReedyWesam G. Yacoub, M.D. [facility]. Pager: [phone]. After hours pager: [phone].

## 2017-08-31 NOTE — Progress Notes (Signed)
  Echocardiogram 2D Echocardiogram has been performed.  Johnathan SavoyCasey N Osualdo [name] 08/31/2017, 9:09 AM

## 2017-09-01 ENCOUNTER — Inpatient Hospital Stay (HOSPITAL_COMMUNITY): Payer: Medicaid Other

## 2017-09-01 DIAGNOSIS — B182 Chronic viral hepatitis C: Secondary | ICD-10-CM

## 2017-09-01 DIAGNOSIS — R579 Shock, unspecified: Secondary | ICD-10-CM

## 2017-09-01 DIAGNOSIS — Z978 Presence of other specified devices: Secondary | ICD-10-CM

## 2017-09-01 DIAGNOSIS — E872 Acidosis, unspecified: Secondary | ICD-10-CM

## 2017-09-01 DIAGNOSIS — L818 Other specified disorders of pigmentation: Secondary | ICD-10-CM

## 2017-09-01 DIAGNOSIS — J930 Spontaneous tension pneumothorax: Secondary | ICD-10-CM

## 2017-09-01 DIAGNOSIS — B9562 Methicillin resistant Staphylococcus aureus infection as the cause of diseases classified elsewhere: Secondary | ICD-10-CM | POA: Diagnosis present

## 2017-09-01 DIAGNOSIS — J939 Pneumothorax, unspecified: Secondary | ICD-10-CM

## 2017-09-01 DIAGNOSIS — R7881 Bacteremia: Secondary | ICD-10-CM | POA: Diagnosis present

## 2017-09-01 DIAGNOSIS — Z7189 Other specified counseling: Secondary | ICD-10-CM

## 2017-09-01 DIAGNOSIS — B181 Chronic viral hepatitis B without delta-agent: Secondary | ICD-10-CM | POA: Diagnosis present

## 2017-09-01 LAB — CBC WITH DIFFERENTIAL/PLATELET
BASOS PCT: 0 %
Basophils Absolute: 0 10*3/uL (ref 0.0–0.1)
Eosinophils Absolute: 0 10*3/uL (ref 0.0–0.7)
Eosinophils Relative: 0 %
HCT: 35.7 % — ABNORMAL LOW (ref 39.0–52.0)
Hemoglobin: 11.3 g/dL — ABNORMAL LOW (ref 13.0–17.0)
LYMPHS ABS: 1.3 10*3/uL (ref 0.7–4.0)
Lymphocytes Relative: 8 %
MCH: 25.3 pg — AB (ref 26.0–34.0)
MCHC: 31.7 g/dL (ref 30.0–36.0)
MCV: 80 fL (ref 78.0–100.0)
MONO ABS: 0.7 10*3/uL (ref 0.1–1.0)
Monocytes Relative: 4 %
NEUTROS ABS: 14.6 10*3/uL — AB (ref 1.7–7.7)
Neutrophils Relative %: 88 %
Platelets: 39 10*3/uL — ABNORMAL LOW (ref 150–400)
RBC: 4.46 MIL/uL (ref 4.22–5.81)
RDW: 17 % — ABNORMAL HIGH (ref 11.5–15.5)
WBC: 16.6 10*3/uL — ABNORMAL HIGH (ref 4.0–10.5)

## 2017-09-01 LAB — BLOOD CULTURE ID PANEL (REFLEXED)
Acinetobacter baumannii: NOT DETECTED
CANDIDA GLABRATA: NOT DETECTED
CANDIDA KRUSEI: NOT DETECTED
CANDIDA PARAPSILOSIS: NOT DETECTED
CANDIDA TROPICALIS: NOT DETECTED
Candida albicans: NOT DETECTED
ESCHERICHIA COLI: NOT DETECTED
Enterobacter cloacae complex: NOT DETECTED
Enterobacteriaceae species: NOT DETECTED
Enterococcus species: NOT DETECTED
HAEMOPHILUS INFLUENZAE: NOT DETECTED
KLEBSIELLA OXYTOCA: NOT DETECTED
KLEBSIELLA PNEUMONIAE: NOT DETECTED
Listeria monocytogenes: NOT DETECTED
Methicillin resistance: DETECTED — AB
Neisseria meningitidis: NOT DETECTED
PSEUDOMONAS AERUGINOSA: NOT DETECTED
Proteus species: NOT DETECTED
STREPTOCOCCUS PNEUMONIAE: NOT DETECTED
STREPTOCOCCUS PYOGENES: NOT DETECTED
Serratia marcescens: NOT DETECTED
Staphylococcus aureus (BCID): DETECTED — AB
Staphylococcus species: DETECTED — AB
Streptococcus agalactiae: NOT DETECTED
Streptococcus species: NOT DETECTED

## 2017-09-01 LAB — POCT I-STAT 3, ART BLOOD GAS (G3+)
Acid-base deficit: 6 mmol/L — ABNORMAL HIGH (ref 0.0–2.0)
Acid-base deficit: 4 mmol/L — ABNORMAL HIGH (ref 0.0–2.0)
BICARBONATE: 27.2 mmol/L (ref 20.0–28.0)
Bicarbonate: 23.4 mmol/L (ref 20.0–28.0)
O2 Saturation: 98 %
O2 Saturation: 83 %
PCO2 ART: 68.2 mmHg — AB (ref 32.0–48.0)
PO2 ART: 65 mmHg — AB (ref 83.0–108.0)
Patient temperature: 99.3
TCO2: 25 mmol/L (ref 22–32)
TCO2: 30 mmol/L (ref 22–32)
pCO2 arterial: 85.6 mmHg (ref 32.0–48.0)
pH, Arterial: 7.145 — CL (ref 7.350–7.450)
pH, Arterial: 7.111 — CL (ref 7.350–7.450)
pO2, Arterial: 135 mmHg — ABNORMAL HIGH (ref 83.0–108.0)

## 2017-09-01 LAB — COMPREHENSIVE METABOLIC PANEL
ALBUMIN: 1.7 g/dL — AB (ref 3.5–5.0)
ALK PHOS: 72 U/L (ref 38–126)
ALT: 29 U/L (ref 0–44)
AST: 48 U/L — AB (ref 15–41)
Anion gap: 8 (ref 5–15)
BILIRUBIN TOTAL: 2.8 mg/dL — AB (ref 0.3–1.2)
BUN: 78 mg/dL — AB (ref 6–20)
CALCIUM: 7.5 mg/dL — AB (ref 8.9–10.3)
CO2: 22 mmol/L (ref 22–32)
Chloride: 114 mmol/L — ABNORMAL HIGH (ref 98–111)
Creatinine, Ser: 1.71 mg/dL — ABNORMAL HIGH (ref 0.61–1.24)
GFR calc Af Amer: 54 mL/min — ABNORMAL LOW (ref >60)
GFR, EST NON AFRICAN AMERICAN: 46 mL/min — AB (ref >60)
GLUCOSE: 141 mg/dL — AB (ref 70–99)
Potassium: 3.9 mmol/L (ref 3.5–5.1)
Sodium: 144 mmol/L (ref 135–145)
TOTAL PROTEIN: 6.4 g/dL — AB (ref 6.5–8.1)

## 2017-09-01 LAB — BODY FLUID CELL COUNT WITH DIFFERENTIAL
Lymphs, Fluid: 18 %
Monocyte-Macrophage-Serous Fluid: 30 % — ABNORMAL LOW (ref 50–90)
NEUTROPHIL FLUID: 52 % — AB (ref 0–25)
Total Nucleated Cell Count, Fluid: 21500 cu mm — ABNORMAL HIGH (ref 0–1000)

## 2017-09-01 LAB — CULTURE, BLOOD (ROUTINE X 2)

## 2017-09-01 LAB — MAGNESIUM: Magnesium: 2.4 mg/dL (ref 1.7–2.4)

## 2017-09-01 LAB — GLUCOSE, CAPILLARY
GLUCOSE-CAPILLARY: 123 mg/dL — AB (ref 70–99)
GLUCOSE-CAPILLARY: 129 mg/dL — AB (ref 70–99)
GLUCOSE-CAPILLARY: 139 mg/dL — AB (ref 70–99)
GLUCOSE-CAPILLARY: 84 mg/dL (ref 70–99)
Glucose-Capillary: 66 mg/dL — ABNORMAL LOW (ref 70–99)
Glucose-Capillary: 69 mg/dL — ABNORMAL LOW (ref 70–99)

## 2017-09-01 LAB — CULTURE, RESPIRATORY W GRAM STAIN

## 2017-09-01 LAB — CULTURE, RESPIRATORY

## 2017-09-01 LAB — PHOSPHORUS: Phosphorus: 4.8 mg/dL — ABNORMAL HIGH (ref 2.5–4.6)

## 2017-09-01 MED ORDER — LIDOCAINE HCL (PF) 1 % IJ SOLN
INTRAMUSCULAR | Status: AC
  Administered 2017-09-01: 5 mL
  Filled 2017-09-01: qty 5

## 2017-09-01 MED ORDER — ARTIFICIAL TEARS OPHTHALMIC OINT
1.0000 "application " | TOPICAL_OINTMENT | Freq: Three times a day (TID) | OPHTHALMIC | Status: DC
  Administered 2017-09-01 (×2): 1 via OPHTHALMIC
  Filled 2017-09-01: qty 3.5

## 2017-09-01 MED ORDER — SODIUM CHLORIDE 0.9 % IV BOLUS
500.0000 mL | Freq: Once | INTRAVENOUS | Status: AC
  Administered 2017-09-01: 500 mL via INTRAVENOUS

## 2017-09-01 MED ORDER — MIDAZOLAM BOLUS VIA INFUSION
2.0000 mg | INTRAVENOUS | Status: DC | PRN
  Filled 2017-09-01: qty 2

## 2017-09-01 MED ORDER — FUROSEMIDE 10 MG/ML IJ SOLN
INTRAMUSCULAR | Status: AC
  Administered 2017-09-01: 60 mg via INTRAVENOUS
  Filled 2017-09-01: qty 6

## 2017-09-01 MED ORDER — PHENYLEPHRINE HCL-NACL 10-0.9 MG/250ML-% IV SOLN: 0.0000 ug/min | INTRAVENOUS | Status: DC

## 2017-09-01 MED ORDER — DEXTROSE 50 % IV SOLN
INTRAVENOUS | Status: AC
  Administered 2017-09-01: 50 mL via INTRAVENOUS
  Filled 2017-09-01: qty 50

## 2017-09-01 MED ORDER — FUROSEMIDE 10 MG/ML IJ SOLN
60.0000 mg | Freq: Once | INTRAMUSCULAR | Status: AC
  Administered 2017-09-01: 60 mg via INTRAVENOUS

## 2017-09-01 MED ORDER — CISATRACURIUM BOLUS VIA INFUSION
0.0500 mg/kg | Freq: Once | INTRAVENOUS | Status: AC
  Administered 2017-09-01: 3.7 mg via INTRAVENOUS
  Filled 2017-09-01: qty 4

## 2017-09-01 MED ORDER — FENTANYL 2500MCG IN NS 250ML (10MCG/ML) PREMIX INFUSION
100.0000 ug/h | INTRAVENOUS | Status: DC
  Administered 2017-09-01 (×2): 300 ug/h via INTRAVENOUS
  Filled 2017-09-01 (×2): qty 250

## 2017-09-01 MED ORDER — SODIUM CHLORIDE 0.9 % IV SOLN
2.0000 mg/h | INTRAVENOUS | Status: DC
  Administered 2017-09-01: 2 mg/h via INTRAVENOUS
  Filled 2017-09-01: qty 10

## 2017-09-01 MED ORDER — FENTANYL BOLUS VIA INFUSION
50.0000 ug | INTRAVENOUS | Status: DC | PRN
  Filled 2017-09-01: qty 50

## 2017-09-01 MED ORDER — MIDAZOLAM HCL 2 MG/2ML IJ SOLN: 2.0000 mg | Freq: Once | INTRAMUSCULAR | Status: DC

## 2017-09-01 MED ORDER — MIDAZOLAM HCL 2 MG/2ML IJ SOLN: 2.0000 mg | Freq: Once | INTRAMUSCULAR | Status: DC | PRN

## 2017-09-01 MED ORDER — FENTANYL BOLUS VIA INFUSION
50.0000 ug | INTRAVENOUS | Status: DC | PRN
  Filled 2017-09-01: qty 100

## 2017-09-01 MED ORDER — SODIUM BICARBONATE 8.4 % IV SOLN
INTRAVENOUS | Status: DC
  Administered 2017-09-01 (×2): via INTRAVENOUS
  Filled 2017-09-01 (×3): qty 150

## 2017-09-01 MED ORDER — FENTANYL CITRATE (PF) 100 MCG/2ML IJ SOLN: 100.0000 ug | Freq: Once | INTRAMUSCULAR | Status: DC | PRN

## 2017-09-01 MED ORDER — VANCOMYCIN HCL IN DEXTROSE 750-5 MG/150ML-% IV SOLN
750.0000 mg | Freq: Two times a day (BID) | INTRAVENOUS | Status: DC
  Administered 2017-09-01: 750 mg via INTRAVENOUS
  Filled 2017-09-01 (×2): qty 150

## 2017-09-01 MED ORDER — SODIUM CHLORIDE 0.9 % IV SOLN
3.0000 ug/kg/min | INTRAVENOUS | Status: DC
  Administered 2017-09-01: 3 ug/kg/min via INTRAVENOUS
  Filled 2017-09-01: qty 20

## 2017-09-01 MED ORDER — FENTANYL CITRATE (PF) 100 MCG/2ML IJ SOLN: 100.0000 ug | Freq: Once | INTRAMUSCULAR | Status: DC

## 2017-09-02 LAB — HEPATITIS B DNA, ULTRAQUANTITATIVE, PCR

## 2017-09-02 LAB — PATHOLOGIST SMEAR REVIEW

## 2017-09-02 LAB — HEPATITIS B E ANTIGEN: HEP B E AG: POSITIVE — AB

## 2017-09-02 LAB — HBV REAL-TIME PCR, QUANT
HBV AS IU/ML: 102000000 IU/mL
LOG10 HBV AS IU/ML: 8.009 {Log_IU}/mL

## 2017-09-03 LAB — CULTURE, BLOOD (ROUTINE X 2): Special Requests: ADEQUATE

## 2017-09-03 LAB — HEPATITIS B E ANTIBODY: HEP B E AB: NEGATIVE

## 2017-09-03 IMAGING — US US RENAL
1 series · 14 of 25 positions shown · non-contrast
Comparison: CT 08/12/2010

CLINICAL DATA: Acute renal failure

EXAM:
RENAL / URINARY TRACT ULTRASOUND COMPLETE

[Series 1: us renal · 0.25mm/px · 14 of 38 slices shown]
[im 1/38]
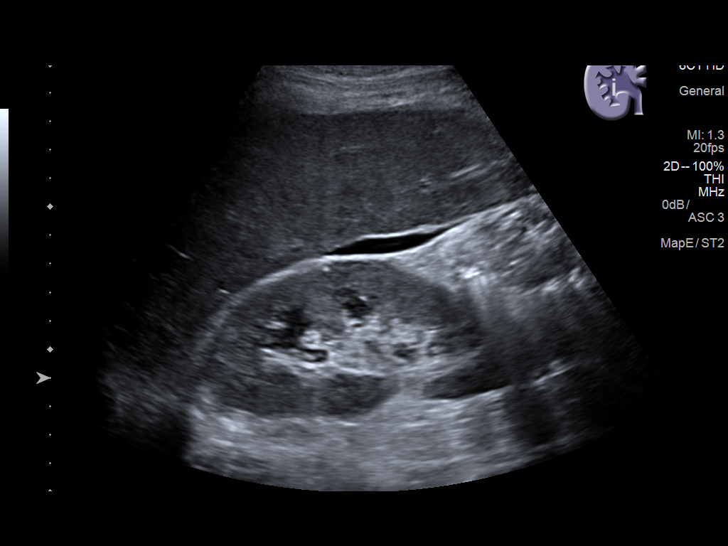
[im 4/38]
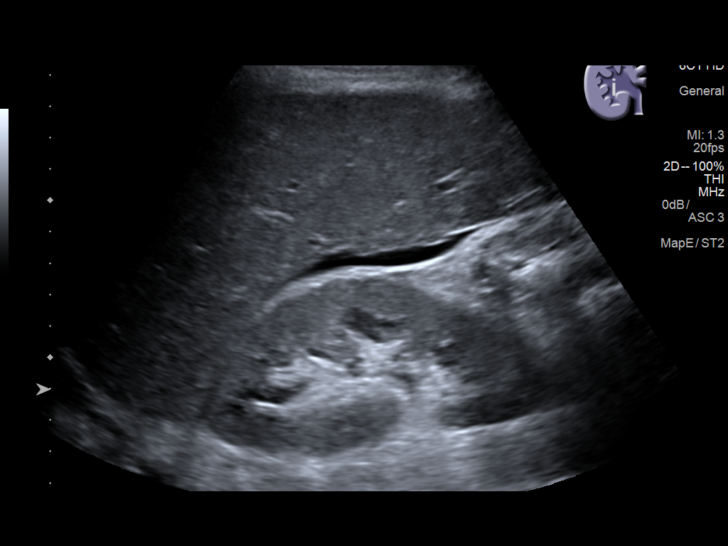
[im 7/38]
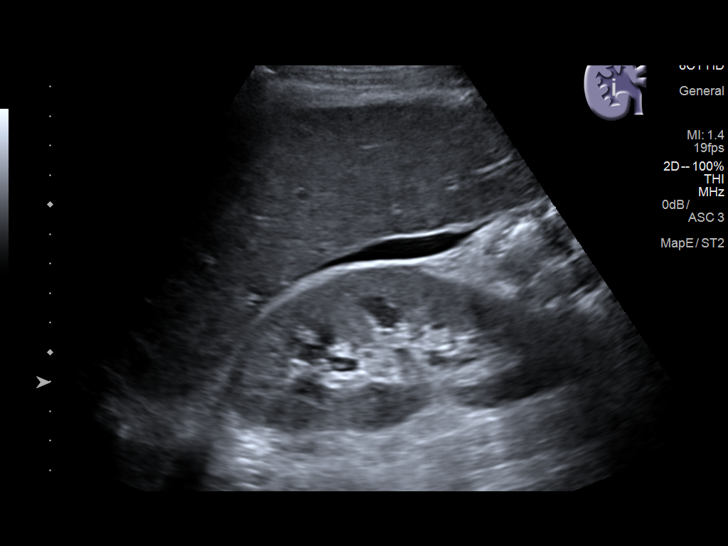
[im 10/38]
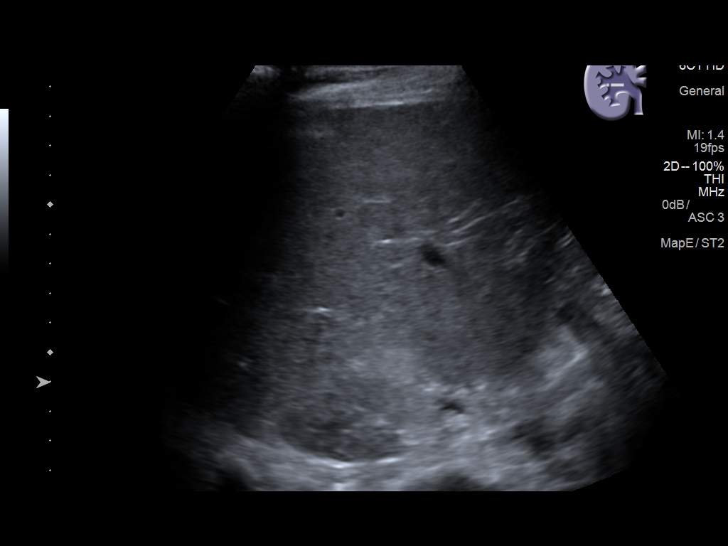
[im 13/38]
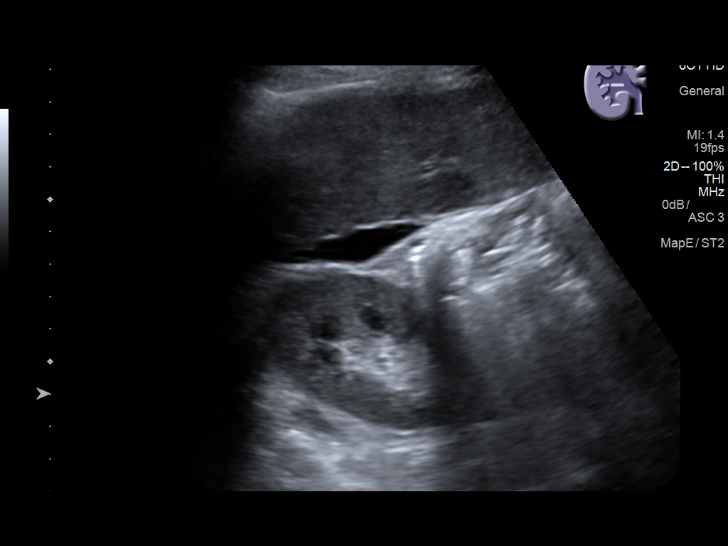
[im 14/38]
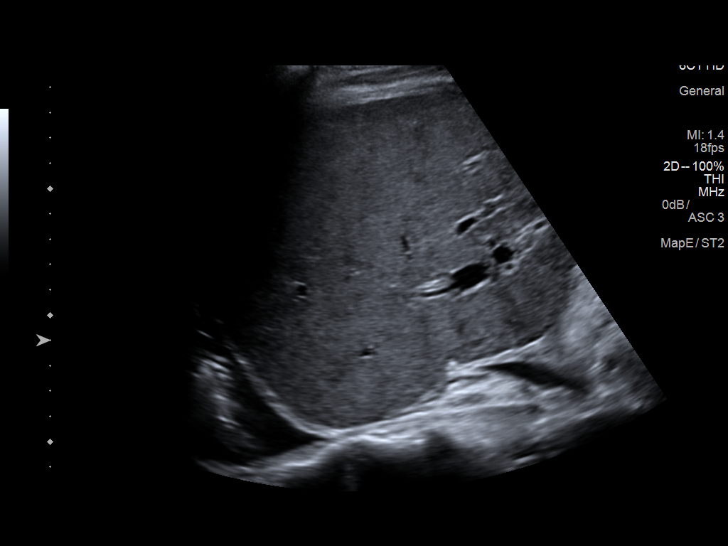
[im 17/38]
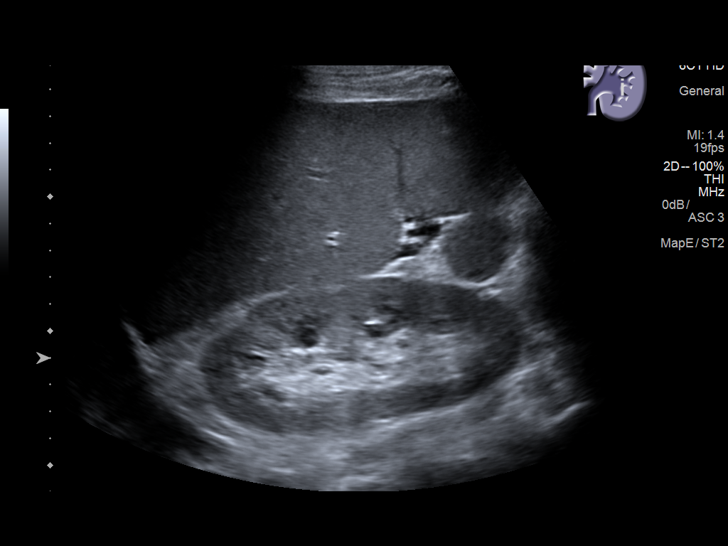
[im 21/38]
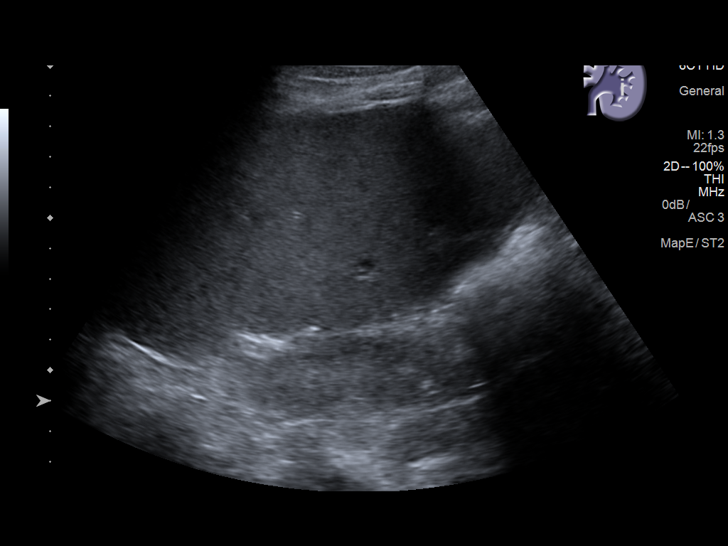
[im 24/38]
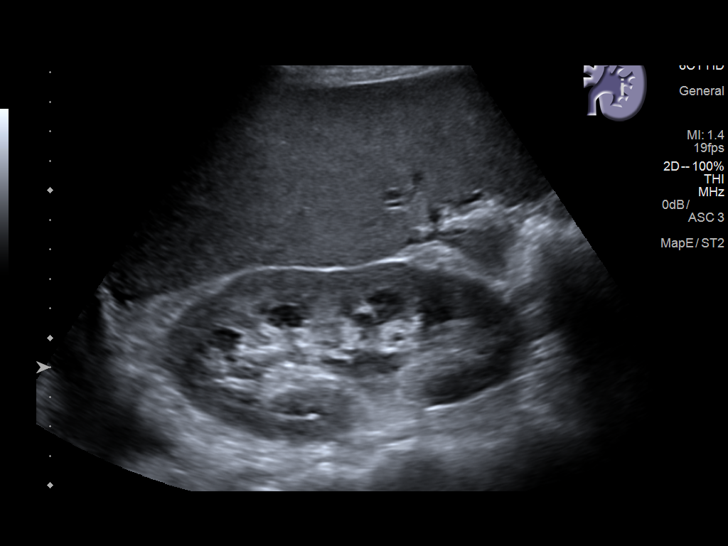
[im 25/38]
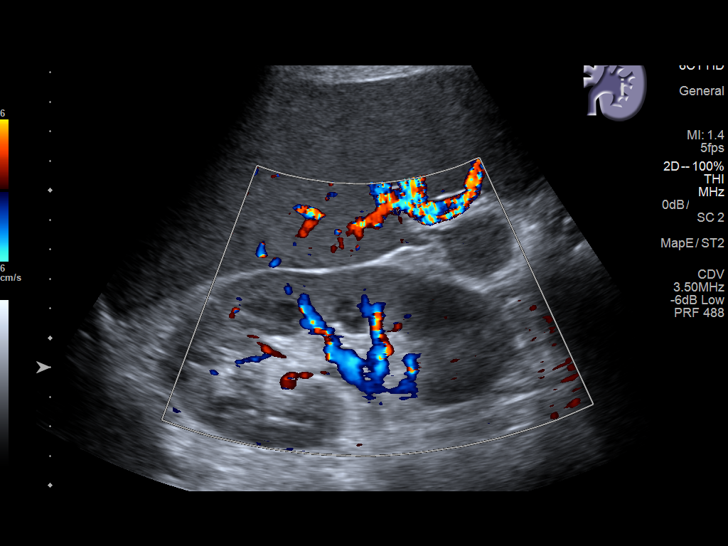
[im 28/38]
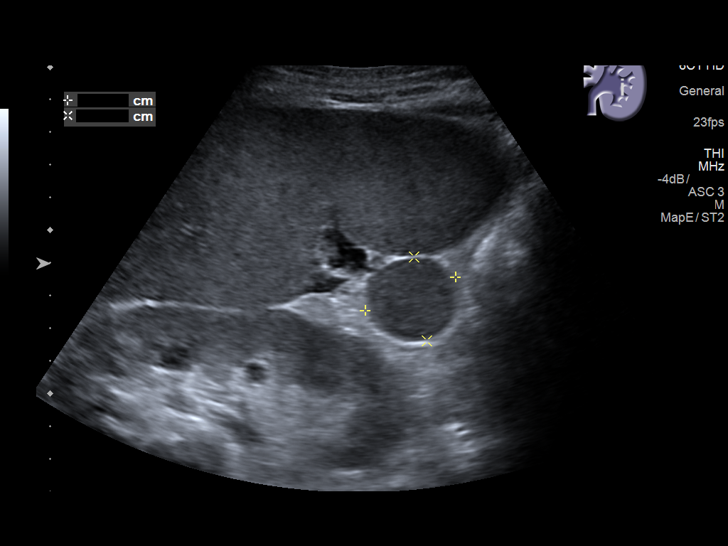
[im 31/38]
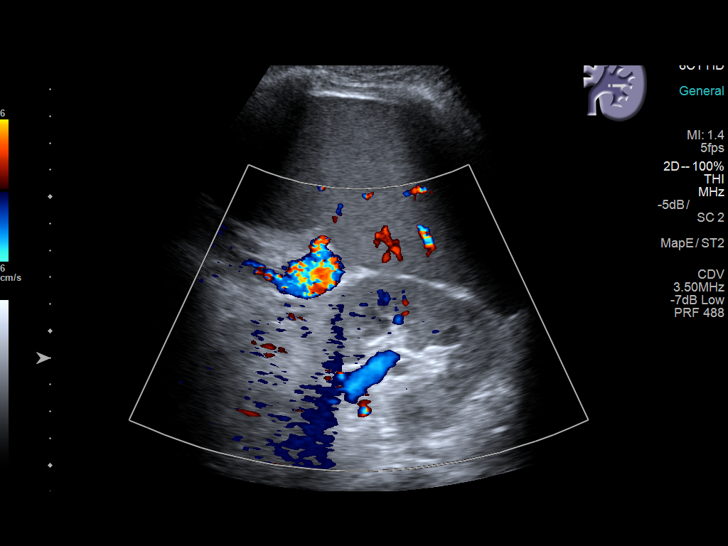
[im 34/38]
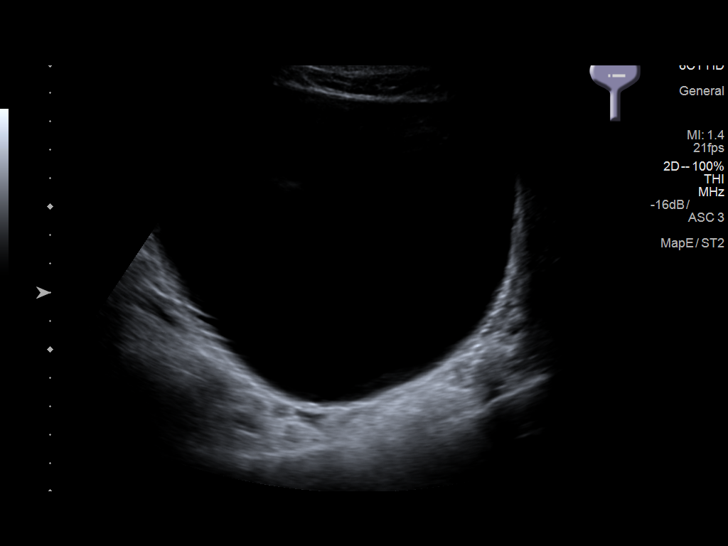
[im 38/38]
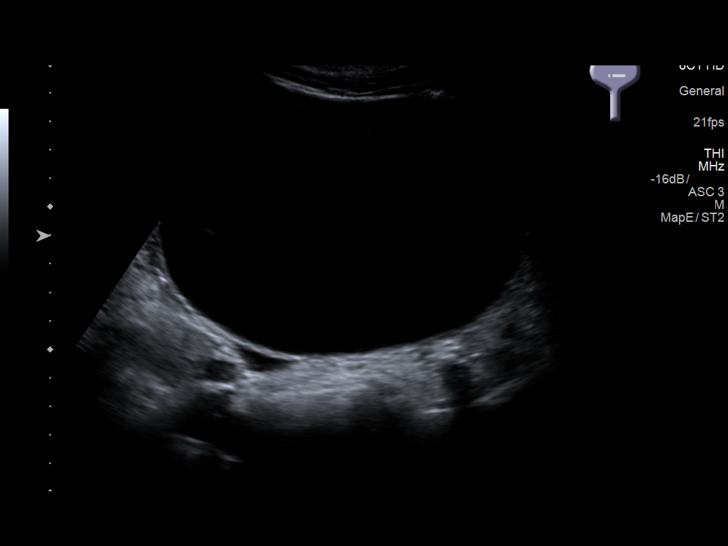

[14 of 25 positions shown; findings below may reference images not displayed]

FINDINGS: Right Kidney:

Length: 12.2 cm. Parenchyma isoechoic to adjacent liver. No focal
lesion. Mild pelvicaliectasis.

Left Kidney:

Length: 12.9 cm. 1.1 x 0.4 cm cortical cyst, midpole. No solid mass
or hydronephrosis.

Bladder:

Distended.  Right ureteral jet documented.

There is a small amount of abdominal ascites identified. Bilateral
pleural effusions noted. Accessory splenule.
IMPRESSION: 1. Mild right pelvicaliectasis, with ureteral jet noted in the
urinary bladder excluding significant ureteral obstruction.
2. Small left renal cyst.
3. Pleural effusions and abdominal ascites.

## 2017-09-04 ENCOUNTER — Telehealth: Payer: Self-pay | Admitting: Pulmonary Disease

## 2017-09-04 LAB — CULTURE, BLOOD (ROUTINE X 2)
Culture: NO GROWTH
SPECIAL REQUESTS: ADEQUATE

## 2017-09-04 NOTE — Telephone Encounter (Signed)
[facility] by Dr.   Arneta ClicheFaxed Copy to [facility] @ [phone] and mailed a copy as requested.

## 2017-09-07 NOTE — Progress Notes (Signed)
eLink Physician-Brief Progress Note Patient Name: Jennet MaduroChristopher Wilshire DOB: 1970/03/23 MRN: [id number]   Date of Service  08/12/2017  HPI/Events of Note  Increased RR into 40's. Already on Precedex and Fentanyl IV infusions at ceilings. Respiratory therapy reports pink tinged, frothy sputum c/w pulmonary edema.   eICU Interventions  Will order: 1. Increase ceiling on Precedex IV infusion to 2 mcg/kg/hour. 2. Increase Fentanyl to 50 - 100 mcg IV Q 1 hour PRN.  3. Increase PEEP to 10. 4. Lasix 60 mg IV X 1 now.  5. Please obtain AM CXR now.      Intervention Category Major Interventions: Hypoxemia - evaluation and management;Respiratory failure - evaluation and management  Doyel Mulkern Eugene 08/21/2017, 2:11 AM

## 2017-09-07 NOTE — Progress Notes (Signed)
Patient's mother is now at bedside with other family members. I updated her on her son's condition and also paged Dr.Yacoub that she is here

## 2017-09-07 NOTE — Progress Notes (Signed)
Dr Molli KnockYacoub notified of patient's decreased blood pressure 77/40 mean 51. Orders received for NS bolus 500 cc and to change levophed to Phenylephrine and restart vasopressin. I also called Mr.Ingber's mother and father individually and informed them of their son's deteriorating condition I requested for them to come to the hospital per Dr Molli KnockYacoub

## 2017-09-07 NOTE — Progress Notes (Signed)
[facility] tubes and lines removed intact. Pt transported with bag of belongings (clothing and dentures).

## 2017-09-07 NOTE — Progress Notes (Signed)
Pt expired on ventilator. Pt removed from vent but ETT remains in place pending possible ME case.

## 2017-09-07 NOTE — Progress Notes (Addendum)
PULMONARY / CRITICAL CARE MEDICINE   Name: Johnathan [name] MRN: [id number] DOB: 10/15/1970    ADMISSION DATE:  09/07/17 CONSULTATION DATE:  09/07/2017  REFERRING MD:  Dr. Rush Landmark   CHIEF COMPLAINT:  Sepsis   HISTORY OF PRESENT ILLNESS:   47 year old male with PMH of MSSA Endocarditis/Bacteremia, IV Drug Abuse with Heroin/IV Opana, Hepatitis C with Cirrhosis, CKD, H/O DVT   Presents to ED on 8/23 with fever, chills, and productive cough with brown-like sputum. States he continues to use IV Heroin with most recent 3 days ago. Upon arrival to ED patient is tachycardiac with HR 150-160, Temp 99, RR 30-40, and Hypoxia with oxygen saturation 70-80. LA 7.21, CXR concerning for multifocal pneumonia. CTA negative for PE, however multiple nodular areas of irregular opacification in both lungs concerning for septic emboli. Given 3L Bolus and Cefepime/Vancomycin. Due to progressive hypoxia attempted BiPAP however patient did not tolerate due to claustrophobia. PCCM asked to consult.     SUBJECTIVE:  L apical chest tube placed o/n for PTX with 300cc output, second 28 french placed rib space 10 with air out due to no change in PTX.  Nimbex, versed and fentanyl as ordered  VITAL SIGNS: BP (!) 80/56 (BP Location: Left Arm)   Pulse (!) 132   Temp 98.7 F (37.1 C) (Oral)   Resp (!) 35   Ht 5\' 9"  (1.753 m)   Wt 76.3 kg   SpO2 94%   BMI 24.84 kg/m   VENTILATOR SETTINGS: Vent Mode: PCV FiO2 (%):  [30 %-100 %] 100 % Set Rate:  [30 bmp-35 bmp] 35 bmp Vt Set:  [570 mL] 570 mL PEEP:  [5 cmH20-10 cmH20] 10 cmH20 Plateau Pressure:  [18 cmH20] 18 cmH20  INTAKE / OUTPUT: I/O last 3 completed shifts: In: 7945.6 [I.V.:4752.3; NG/GT:2955; IV Piggyback:238.3] Out: 2695 [Urine:2395; Chest Tube:300]  PHYSICAL EXAMINATION:   Constitution: Sedated and paralyzed, older than stated age HEENT: [place]/AT, PERRL, EOM-I and MMM Cardio: RRR, Nl S1/S2 and -M/R/G Respiratory: Coarse BS diffusely, apical chest  tube, lateral chest tube rib space 10 Abdominal: Soft, NT, ND and +BS MSK: -edema and -tenderness Neuro: Sedated, not responding to stimuli, paralyzed Skin: c/d/i   LABS:  BMET Recent Labs  Lab 08/30/17 0342 08/31/17 0349 08/18/2017 0340  NA 140 139 144  K 3.8 4.3 3.9  CL 104 107 114*  CO2 23 21* 22  BUN 58* 81* 78*  CREATININE 2.36* 2.50* 1.71*  GLUCOSE 71 222* 141*   Electrolytes Recent Labs  Lab 2017-09-07 2333 08/30/17 0342 08/31/17 0349 08/22/2017 0340  CALCIUM  --  7.1* 7.0* 7.5*  MG 1.3* 1.8 2.3 2.4  PHOS 3.0  --  4.3 4.8*   CBC Recent Labs  Lab 08/30/17 0342 08/31/17 0349 08/11/2017 0340  WBC 5.2 9.3 16.6*  HGB 11.3* 10.6* 11.3*  HCT 35.4* 32.3* 35.7*  PLT 37* 44* 39*   Coag's No results for input(s): APTT, INR in the last 168 hours.  Sepsis Markers Recent Labs  Lab 2017-09-07 2008 09/07/2017 2333 08/30/17 0341 08/30/17 0342 08/31/17 0349  LATICACIDVEN 6.42* 5.8* 3.4*  --   --   PROCALCITON  --  8.05  --  8.94 23.36   ABG Recent Labs  Lab 08/31/17 0316 08/10/2017 0333 08/20/2017 0853  PHART 7.342* 7.145* 7.111*  PCO2ART 40.5 68.2* 85.6*  PO2ART 138* 135.0* 65.0*   Liver Enzymes Recent Labs  Lab 2017-09-07 1907 08/31/17 0349 08/13/2017 0340  AST 48* 63* 48*  ALT 21 24 29  ALKPHOS 83 55 72  BILITOT 2.3* 2.7* 2.8*  ALBUMIN 2.3* 1.7* 1.7*   Cardiac Enzymes Recent Labs  Lab 08/11/2017 2333 08/30/17 0342 08/30/17 1524  TROPONINI 0.04* 0.04* 0.05*   Glucose Recent Labs  Lab 08/31/17 1112 08/31/17 1516 08/31/17 1955 08/31/17 2317 2017/09/10 0458 09/10/2017 0731  GLUCAP 217* 218* 204* 169* 123* 129*   Imaging Dg Chest Port 1 View  Result Date: Sep 10, 2017 CLINICAL DATA:  47 year old male with pneumothorax status post second chest tube placement. EXAM: PORTABLE CHEST 1 VIEW COMPARISON:  Earlier radiograph dated 2017-09-10 FINDINGS: Interval placement of a second chest tube with tip along the left upper pleura. A previously placed chest tube  with tip projecting over the left hilum. There has been near complete interval resolution of the previously seen pneumothorax with re-expansion of the left lung. Probable small amount of residual pneumothorax in the left apex. Background of emphysema and confluent airspace opacities. Stable cardiac silhouette and stable positioning of the support devices. Left chest wall soft tissue emphysema. IMPRESSION: Interval placement of a second chest tube with re-expansion of the left lung and near complete resolution of the left pneumothorax. Electronically Signed   By: Elgie Collard M.D.   On: 10-Sep-2017 05:30   Dg Chest Port 1 View  Result Date: 09/10/17 CLINICAL DATA:  47 year old male with pneumothorax status post chest tube placement. EXAM: PORTABLE CHEST 1 VIEW COMPARISON:  Earlier radiograph dated September 10, 2017 FINDINGS: Moderate to large left pneumothorax appears slightly increased compared to the earlier radiograph. There has been interval placement of a left-sided chest tube with tip over the region of the left hilum. There is background of emphysema and multifocal airspace disease similar to prior radiograph. There is similar appearance of slight shift of the mediastinum to the right which may represent a degree of tension. The previously seen support devices are in similar position. There is left chest wall soft tissue emphysema. IMPRESSION: Interval placement of a left-sided chest tube with overall increase in the size of pneumothorax since the prior radiograph. Mild shift of the mediastinum to the right may represent a degree of tension. These results were called by telephone at the time of interpretation on 09-10-17 at 4:26 am to [facility], who verbally acknowledged these results. Electronically Signed   By: Elgie Collard M.D.   On: 09-10-17 04:32   Dg Chest Port 1 View  Result Date: 09/10/2017 CLINICAL DATA:  47 year old male with shortness of breath and intubation. EXAM: PORTABLE CHEST 1  VIEW COMPARISON:  Chest radiograph dated 08/31/2017 FINDINGS: There has been interval development of a moderate left pneumothorax measuring up to 3.7 cm from the left lateral pleural space. There is background of emphysema with confluent areas of airspace opacities. No significant midline shift. Support tubes as seen previously. IMPRESSION: 1. Interval development of a moderate left pneumothorax. 2. Emphysema with confluent airspace opacities. 3. Stable positioning of the support devices. These results were called by telephone at the time of interpretation on 2017/09/10 at 2:27 am to nurse Madilyn Fireman, who verbally acknowledged these results. Electronically Signed   By: Elgie Collard M.D.   On: 09/10/17 02:39   STUDIES:  CXR 8/23 > Hyperinflation with marked bullous emphysematous disease. Multifocal airspace disease in left greater than right may reflect pneumonia. Left lower lung focal airspace disease is somewhat nodular in configuration, radiographic follow-up to resolution recommended to exclude pulmonary mass lesion CTA Chest 8/23 > No evidence pulmonary embolism. Severe emphysematous changes in the upper lobes. Confluent areas of infiltrate are  seen in the upper lobes bilaterally consistent with multifocal pneumonia. However, multiple additional nodular areas of irregular opacification are seen in both lungs, some of which contain central cavitation, highly suspicious for septic emboli. 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 Recommend annual imaging followup by CTA or MRA.  CXR 8/24: Interval placement of a second chest tube with re-expansion of the left lung and near complete resolution of the left pneumothorax.  . sodium chloride Stopped (09-02-2017 0945)  . cisatracurium (NIMBEX) infusion 2.5 mcg/kg/min (09/02/17 1000)  . dexmedetomidine (PRECEDEX) IV infusion Stopped (2017/09/02 0800)  . feeding supplement (VITAL AF 1.2 CAL) 75 mL/hr at Sep 02, 2017 0600   . fentaNYL infusion INTRAVENOUS 400 mcg/hr (02-Sep-2017 1000)  . midazolam (VERSED) infusion 2 mg/hr (09/02/17 1000)  . norepinephrine (LEVOPHED) Adult infusion 5 mcg/min (Sep 02, 2017 1000)  . phenylephrine (NEO-SYNEPHRINE) Adult infusion Stopped (08/31/17 0303)  .  sodium bicarbonate  infusion 1000 mL 100 mL/hr at 09-02-2017 1000  . vancomycin    . vasopressin (PITRESSIN) infusion - *FOR SHOCK* Stopped (08/31/17 1849)    CULTURES: Blood 8/23 > +MRSA Sputum 8/23 >  U/A 8/23 > no growth Blood 8/24 >   ANTIBIOTICS: Vancomycin 8/23>> Cefepime 8/23 >> 8/24  SIGNIFICANT EVENTS: 8/23 > Presents to ED  LINES/TUBES: PIV   DISCUSSION: 47 year old male with PMH of MRSA Endocarditis presents to ED with tachycardia and hypoxia. Positive MRSA blood cultures 8/24. Found on CT to have apical emphysematous disease and central cavitary lesions significant for likely septic emobli.   ASSESSMENT / PLAN:  Acute Hypoxic Respiratory Failure in setting of central cavitation in setting of septic emboli with right upper and left upper opacity Severe Emphysematous Disease  H/O RLL Cavitary Pneumonia in setting of Endocarditis  Intubated 8/24. PC RR 30 Vt 570 FiO2 90% PEEP 20/10. Fent 300, nimbex , versed 2mg /hr pressors held. D/C precedex Continue bicarb Fent/versed added for vent dyssyncrhony Cont. PC ventilation  Titrate O2 for sat of 88-92% CXR and ABG in AM Abx as ordered Duboneb/Pulmiort/Brovana Patient is unlikely to survive this admission, no changes in the ventilator at this time  Tachycardia in setting of septic shock  MRSA Endocarditis with septic emboli  Grade 1 Diastolic Dysfunction (EF 55-60)  H/O MRSA/Pseudomonas bacteremia/Tricuspid Valve Endocarditis  Echo did not show endocarditis but likely considering patient's history.   Metoprolol  TEE today  Continue vancomycin Trend WBC and monitor for fever Trend PCT  Anion Gap Metabolic Acidosis with Lactic Acidosis  LA 7.21 >  6.42 >> 3.4 Acute Renal Injury  Cr. 2.5 >> 1.17  UCr: 128, Una: 19  Diurese - lasix, metazolone  Trend BMP Replace electrolytes as indicated   Hepatitis C with Cirrhosis  TF  Thrombocytopenia, anemia with H/O LUE Radial Vein DVT 7/24: Stable 34 >> 39 - HIT risk <5% Likely secondary to endocarditis.  Trend CBC  Maintain Hbg >7  Transfuse for platelets <20  Hypoglycemia -    Trend Glucose q4h Consult nutrition for TF per nutrition   Substance Abuse > IV Opana, Heroin  CIWA protocol RASS Goal 0/-1 Wean Precedex to Achieve RASS   VTE: heparin, SCDs  Diet: NPO  IVF: none  FAMILY  - Updates: Spoke with mother over the phone, informed her that patient is unlikely to survive this and that we need to discuss code status.  She would like to speak with his father first.  - Inter-disciplinary family meet or Palliative Care meeting due by: 09/05/2017   The  patient is critically ill with multiple organ systems failure and requires high complexity decision making for assessment and support, frequent evaluation and titration of therapies, application of advanced monitoring technologies and extensive interpretation of multiple databases.   Critical Care Time devoted to patient care services described in this note is  45  Minutes. This time reflects time of care of this signee Dr Koren BoundWesam Yacoub. This critical care time does not reflect procedure time, or teaching time or supervisory time of PA/NP/Med student/Med Resident etc but could involve care discussion time.  Alyson ReedyWesam G. Yacoub, M.D. [facility]. Pager: [phone]. After hours pager: [phone].

## 2017-09-07 NOTE — Progress Notes (Signed)
   2017/12/05 1800  Clinical Encounter Type  Visited With Family  Visit Type Death  Referral From Nurse  Spiritual Encounters  Spiritual Needs Prayer;Grief support;Emotional  CH paged to offer support to family following patient death; CH offered spiritual, emotional and grief support along with prayer; CH available as needed.

## 2017-09-07 NOTE — Progress Notes (Signed)
I updated Dr Molli KnockYacoub that patients systolic blood pressure remains in 70''s .Phenylephrine increased to 400 mcg per orders

## 2017-09-07 NOTE — Progress Notes (Addendum)
Patient's systolic BP decreased to 58 DrYacoub paged and is coming to bedside

## 2017-09-07 NOTE — Progress Notes (Signed)
Patient ID: Johnathan [name], male   DOB: October 28, 1970, 47 y.o.   MRN: [id number]         [facility] for Infectious Disease  Date of Admission:  08/09/2017           Day 4 vancomycin ASSESSMENT: He has MRSA bacteremia complicated by pneumonia and left-sided pneumothorax.  He is at high risk for recurrent endocarditis.  He just recently completed 6 weeks of therapy for MSSA and Pseudomonas bacteremia complicated by valve endocarditis.  He has hepatitis C but cleared his viral load spontaneously.  He has chronic hepatitis B.  Recent testing shows that he is HIV negative.  PLAN: 1. Continue vancomycin 2. Check hepatitis B E antigen, E antibody and DNA viral load  Principal Problem:   MRSA bacteremia Active Problems:   Pneumonia   Pneumothorax   Septic shock (HCC)   Chronic hepatitis C ([place])   Chronic viral hepatitis B without delta-agent (HCC)   Substance abuse (HCC)   Scheduled Meds: . arformoterol  15 mcg Nebulization BID  . artificial tears  1 application Both Eyes W6O  . budesonide (PULMICORT) nebulizer solution  0.5 mg Nebulization BID  . chlorhexidine gluconate (MEDLINE KIT)  15 mL Mouth Rinse BID  . Chlorhexidine Gluconate Cloth  6 each Topical Daily  . famotidine  20 mg Per Tube Daily  . fentaNYL (SUBLIMAZE) injection  100 mcg Intravenous Once  . heparin  5,000 Units Subcutaneous Q8H  . hydrocortisone sod succinate (SOLU-CORTEF) inj  50 mg Intravenous Q6H  . insulin aspart  0-15 Units Subcutaneous Q4H  . ipratropium-albuterol  3 mL Nebulization Q6H  . mouth rinse  15 mL Mouth Rinse 10 times per day  . midazolam  2 mg Intravenous Once  . sodium chloride flush  10-40 mL Intracatheter Q12H   Continuous Infusions: . sodium chloride 10 mL/hr at 09/11/17 0600  . cisatracurium (NIMBEX) infusion 3 mcg/kg/min (Sep 11, 2017 0600)  . dexmedetomidine (PRECEDEX) IV infusion Stopped (09/11/17 0800)  . feeding supplement (VITAL AF 1.2 CAL) 75 mL/hr at Sep 11, 2017 0600  . fentaNYL  infusion INTRAVENOUS 300 mcg/hr (2017-09-11 0800)  . midazolam (VERSED) infusion 2 mg/hr (09-11-2017 0600)  . norepinephrine (LEVOPHED) Adult infusion Stopped (Sep 11, 2017 0151)  . phenylephrine (NEO-SYNEPHRINE) Adult infusion    . phenylephrine (NEO-SYNEPHRINE) Adult infusion Stopped (08/31/17 0303)  .  sodium bicarbonate  infusion 1000 mL 100 mL/hr at 11-Sep-2017 0600  . sodium chloride 500 mL (2017-09-11 0949)  . vancomycin Stopped (08/31/17 1814)  . vasopressin (PITRESSIN) infusion - *FOR SHOCK* Stopped (08/31/17 1849)   PRN Meds:.sodium chloride, acetaminophen (TYLENOL) oral liquid 160 mg/5 mL, fentaNYL, fentaNYL (SUBLIMAZE) injection, midazolam, midazolam, sodium chloride flush   Review of Systems: Review of Systems  Unable to perform ROS: Intubated    Allergies  Allergen Reactions  . Ativan [Lorazepam]     Paradoxical Reaction    OBJECTIVE: Vitals:   11-Sep-2017 0900 09-11-17 0915 09-11-17 0930 Sep 11, 2017 0945  BP: 95/66     Pulse: (!) 134 (!) 136 (!) 132   Resp: (!) 35 (!) 35 (!) 35 (!) 35  Temp:      TempSrc:      SpO2: 90% 92% 94%   Weight:      Height:       Body mass index is 24.84 kg/m.  Physical Exam  Constitutional:  He is sedated on the ventilator.  He is back on pressors.  Cardiovascular: Regular rhythm and normal heart sounds.  No murmur heard. He is tachycardic.  Pulmonary/Chest:  2 left-sided chest tubes were placed overnight.  Abdominal: He exhibits distension.  Skin:  Multiple tattoos.  No splinter or conjunctival hemorrhages    Lab Results Lab Results  Component Value Date   WBC 16.6 (H) 09/18/2017   HGB 11.3 (L) September 18, 2017   HCT 35.7 (L) 09-18-17   MCV 80.0 18-Sep-2017   PLT 39 (L) 18-Sep-2017    Lab Results  Component Value Date   CREATININE 1.71 (H) Sep 18, 2017   BUN 78 (H) 18-Sep-2017   NA 144 Sep 18, 2017   K 3.9 09-18-17   CL 114 (H) 09-18-2017   CO2 22 September 18, 2017    Lab Results  Component Value Date   ALT 29 09/18/2017   AST 48 (H)  Sep 18, 2017   ALKPHOS 72 09/18/2017   BILITOT 2.8 (H) 09/18/2017     Microbiology: Recent Results (from the past 240 hour(s))  Urine culture     Status: None   Collection Time: 08/11/2017  5:25 PM  Result Value Ref Range Status   Specimen Description URINE, RANDOM  Final   Special Requests NONE  Final   Culture   Final    NO GROWTH Performed at [facility], [place] [address] [place], [place] [address]    Report Status 08/31/2017 FINAL  Final  Blood culture (routine x 2)     Status: Abnormal   Collection Time: 08/26/2017  5:30 PM  Result Value Ref Range Status   Specimen Description BLOOD THUMB RIGHT  Final   Special Requests   Final    BOTTLES DRAWN AEROBIC AND ANAEROBIC Blood Culture results may not be optimal due to an inadequate volume of blood received in culture bottles   Culture  Setup Time   Final    GRAM POSITIVE COCCI IN BOTH AEROBIC AND ANAEROBIC BOTTLES CRITICAL RESULT CALLED TO, READ BACK BY AND VERIFIED WITH: E. DEJA, PHARMD AT 0930 ON 08/30/17 BY C. JESSUP, MLT.    Culture (A)  Final    STAPHYLOCOCCUS AUREUS SUSCEPTIBILITIES PERFORMED ON PREVIOUS CULTURE WITHIN THE LAST 5 DAYS. Performed at [facility], [place] [address], [place], [place] [address]    Report Status 09-18-17 FINAL  Final  Blood culture (routine x 2)     Status: Abnormal   Collection Time: 08/09/2017  7:06 PM  Result Value Ref Range Status   Specimen Description BLOOD LEFT HAND  Final   Special Requests   Final    BOTTLES DRAWN AEROBIC AND ANAEROBIC Blood Culture results may not be optimal due to an inadequate volume of blood received in culture bottles   Culture  Setup Time   Final    GRAM POSITIVE COCCI IN BOTH AEROBIC AND ANAEROBIC BOTTLES CRITICAL RESULT CALLED TO, READ BACK BY AND VERIFIED WITH: E. DEJA, PHARMD AT 0930 ON 08/30/17 BY C. JESSUP, MLT. Performed at [facility], [place] [address], [place], [place] [address]    Culture METHICILLIN RESISTANT STAPHYLOCOCCUS AUREUS (A)   Final   Report Status 09-18-17 FINAL  Final   Organism ID, Bacteria METHICILLIN RESISTANT STAPHYLOCOCCUS AUREUS  Final      Susceptibility   Methicillin resistant staphylococcus aureus - MIC*    CIPROFLOXACIN <=0.5 SENSITIVE Sensitive     ERYTHROMYCIN >=8 RESISTANT Resistant     GENTAMICIN <=0.5 SENSITIVE Sensitive     OXACILLIN >=4 RESISTANT Resistant     TETRACYCLINE <=1 SENSITIVE Sensitive     VANCOMYCIN 1 SENSITIVE Sensitive     TRIMETH/SULFA <=10 SENSITIVE Sensitive  CLINDAMYCIN <=0.25 SENSITIVE Sensitive     RIFAMPIN <=0.5 SENSITIVE Sensitive     Inducible Clindamycin NEGATIVE Sensitive     * METHICILLIN RESISTANT STAPHYLOCOCCUS AUREUS  Blood Culture ID Panel (Reflexed)     Status: Abnormal   Collection Time: 09/02/2017  7:06 PM  Result Value Ref Range Status   Enterococcus species NOT DETECTED NOT DETECTED Final   Listeria monocytogenes NOT DETECTED NOT DETECTED Final   Staphylococcus species DETECTED (A) NOT DETECTED Final    Comment: CRITICAL RESULT CALLED TO, READ BACK BY AND VERIFIED WITH: E. DEJA, PHARMD AT 0930 ON 08/30/17 BY C. JESSUP, MLT.    Staphylococcus aureus DETECTED (A) NOT DETECTED Final    Comment: Methicillin (oxacillin)-resistant Staphylococcus aureus (MRSA). MRSA is predictably resistant to beta-lactam antibiotics (except ceftaroline). Preferred therapy is vancomycin unless clinically contraindicated. Patient requires contact precautions if  hospitalized. CRITICAL RESULT CALLED TO, READ BACK BY AND VERIFIED WITH: E. DEJA, PHARMD AT 0930 ON 08/30/17 BY C. JESSUP, MLT.    Methicillin resistance DETECTED (A) NOT DETECTED Final    Comment: CRITICAL RESULT CALLED TO, READ BACK BY AND VERIFIED WITH: E. DEJA, PHARMD AT 0930 ON 08/30/17 BY C. JESSUP, MLT.    Streptococcus species NOT DETECTED NOT DETECTED Final   Streptococcus agalactiae NOT DETECTED NOT DETECTED Final   Streptococcus pneumoniae NOT DETECTED NOT DETECTED Final   Streptococcus pyogenes NOT  DETECTED NOT DETECTED Final   Acinetobacter baumannii NOT DETECTED NOT DETECTED Final   Enterobacteriaceae species NOT DETECTED NOT DETECTED Final   Enterobacter cloacae complex NOT DETECTED NOT DETECTED Final   Escherichia coli NOT DETECTED NOT DETECTED Final   Klebsiella oxytoca NOT DETECTED NOT DETECTED Final   Klebsiella pneumoniae NOT DETECTED NOT DETECTED Final   Proteus species NOT DETECTED NOT DETECTED Final   Serratia marcescens NOT DETECTED NOT DETECTED Final   Haemophilus influenzae NOT DETECTED NOT DETECTED Final   Neisseria meningitidis NOT DETECTED NOT DETECTED Final   Pseudomonas aeruginosa NOT DETECTED NOT DETECTED Final   Candida albicans NOT DETECTED NOT DETECTED Final   Candida glabrata NOT DETECTED NOT DETECTED Final   Candida krusei NOT DETECTED NOT DETECTED Final   Candida parapsilosis NOT DETECTED NOT DETECTED Final   Candida tropicalis NOT DETECTED NOT DETECTED Final    Comment: Performed at [facility], [place] [address], [place], [place] [address]  MRSA PCR Screening     Status: Abnormal   Collection Time: 08/09/2017 10:09 PM  Result Value Ref Range Status   MRSA by PCR POSITIVE (A) NEGATIVE Final    Comment:        The GeneXpert MRSA Assay (FDA approved for NASAL specimens only), is one component of a comprehensive MRSA colonization surveillance program. It is not intended to diagnose MRSA infection nor to guide or monitor treatment for MRSA infections. RESULT CALLED TO, READ BACK BY AND VERIFIED WITHWillis Modena RN 2800 08/30/17 A BROWNING Performed at [facility], [place] [address] [place], [place] [address]   Culture, respiratory (non-expectorated)     Status: None   Collection Time: 08/30/17  8:03 AM  Result Value Ref Range Status   Specimen Description TRACHEAL ASPIRATE  Final   Special Requests NONE  Final   Gram Stain   Final    RARE WBC PRESENT,BOTH PMN AND MONONUCLEAR RARE GRAM POSITIVE COCCI IN PAIRS IN CLUSTERS Performed at [facility], [place] [address] [place], [place] [address]    Culture   Final  MODERATE METHICILLIN RESISTANT STAPHYLOCOCCUS AUREUS   Report Status Sep 14, 2017 FINAL  Final   Organism ID, Bacteria METHICILLIN RESISTANT STAPHYLOCOCCUS AUREUS  Final      Susceptibility   Methicillin resistant staphylococcus aureus - MIC*    CIPROFLOXACIN <=0.5 SENSITIVE Sensitive     ERYTHROMYCIN >=8 RESISTANT Resistant     GENTAMICIN <=0.5 SENSITIVE Sensitive     OXACILLIN >=4 RESISTANT Resistant     TETRACYCLINE <=1 SENSITIVE Sensitive     VANCOMYCIN <=0.5 SENSITIVE Sensitive     TRIMETH/SULFA <=10 SENSITIVE Sensitive     CLINDAMYCIN <=0.25 SENSITIVE Sensitive     RIFAMPIN <=0.5 SENSITIVE Sensitive     Inducible Clindamycin NEGATIVE Sensitive     * MODERATE METHICILLIN RESISTANT STAPHYLOCOCCUS AUREUS  Culture, blood (routine x 2)     Status: None (Preliminary result)   Collection Time: 08/30/17  9:58 PM  Result Value Ref Range Status   Specimen Description BLOOD LEFT HAND  Final   Special Requests   Final    BOTTLES DRAWN AEROBIC ONLY Blood Culture adequate volume   Culture   Final    NO GROWTH < 24 HOURS Performed at [facility], [address], [place], [place] [address]    Report Status PENDING  Incomplete  Culture, blood (routine x 2)     Status: None (Preliminary result)   Collection Time: 08/30/17  9:58 PM  Result Value Ref Range Status   Specimen Description BLOOD LEFT THUMB  Final   Special Requests   Final    BOTTLES DRAWN AEROBIC ONLY Blood Culture adequate volume   Culture  Setup Time   Final    GRAM POSITIVE COCCI IN CLUSTERS AEROBIC BOTTLE ONLY Organism ID to follow    Culture   Final    NO GROWTH < 24 HOURS Performed at [facility], [place] [address] [place], [place] [address]    Report Status PENDING  Incomplete    Michel Bickers, MD [facility] for [facility] [phone] pager   [phone] cell September 14, 2017, 9:55 AM

## 2017-09-07 NOTE — Progress Notes (Signed)
[facility] P[facility]M  purulent pleural fluid, sent pleural fluid culture  Heber CarolinaBrent McQuaid, MD [place] [facility] Pager: [phone] Cell: [phone] After 3pm or if no response, call [phone]

## 2017-09-07 NOTE — Progress Notes (Addendum)
Pharmacy Antibiotic Note  Johnathan MaduroChristopher Kosanke is a 47 y.o. male admitted on 08/24/2017 with MRSA sepsis.  Pharmacy has been consulted for Vancomycin dosing. Pt was also receiving Cefepime in addition to Vancomycin until 8/24, but narrowed therapy with BCx results.  WBC elevated at 16.6, currently afebrile. Most recent lactate 3.4 on 8/24, which is improved from 6.42 on admission. Improved kidney function with Scr 1.71 from 2.77 on admission. Increased urine output (2 L) compared to -1L 8/24.  Plan: Increase Vancomycin 750mg  IV every 12 hours.  Goal trough 15-20 mcg/mL. Monitor Scr, CBC, VT at steady state  Height: 5\' 9"  (175.3 cm) Weight: 168 lb 3.4 oz (76.3 kg) IBW/kg (Calculated) : 70.7  Temp (24hrs), Avg:99.3 F (37.4 C), Min:98.6 F (37 C), Max:100.4 F (38 C)  Recent Labs  Lab 09/05/2017 1730 08/31/2017 1736 08/15/2017 1907 08/09/2017 2008 08/28/2017 2333 08/30/17 0341 08/30/17 0342 08/31/17 0349 2017/05/30 0340  WBC 8.1  --   --   --   --   --  5.2 9.3 16.6*  CREATININE  --   --  2.77*  --   --   --  2.36* 2.50* 1.71*  LATICACIDVEN  --  7.21*  --  6.42* 5.8* 3.4*  --   --   --     Estimated Creatinine Clearance: 54 mL/min (A) (by C-G formula based on SCr of 1.71 mg/dL (H)).    Allergies  Allergen Reactions  . Ativan [Lorazepam]     Paradoxical Reaction    Antimicrobials this admission: Vancomycin 8/23 >>  Cefepime 8/23 >> 8/24  Microbiology results: 8/23 BCx: MRSA, resistant erythromycin and oxacillin *BCx were not collected prior to abx initiation d/t difficult stick 8/23 UCx: No growth 8/24 BCx: 8/24 Tracheal aspirate: Staph aureus   Tama Headingshristine   Adel Burch, PharmD Candidate Nov 15, 2017 8:58 AM

## 2017-09-07 NOTE — Op Note (Signed)
Chest Tube Insertion Procedure Note  Indications:  Clinically significant Pneumothorax  Pre-operative Diagnosis: Pneumothorax  Post-operative Diagnosis: Pneumothorax  Procedure Details  Informed consent was obtained for the procedure, including sedation.  Risks of lung perforation, hemorrhage, arrhythmia, and adverse drug reaction were discussed.   After sterile skin prep, using standard technique, a 28 French tube was placed in the left lateral 10th rib space.  Findings: Rush of air  Estimated Blood Loss:  Minimal         Specimens:  None              Complications:  None; patient tolerated the procedure well.         Disposition: ICU - intubated and critically ill.         Condition: stable  Attending Attestation: I performed the procedure.  Heber CarolinaBrent McQuaid, MD [place] [facility]M Pager: [phone] Cell: [phone] After 3pm or if no response, call [phone]

## 2017-09-07 NOTE — Progress Notes (Signed)
Critical ABG results verbally given to Dr Cleaster CorinSeawell. Increased RR from 30 to 35. RT will continue to closely monitor pt

## 2017-09-07 NOTE — Progress Notes (Signed)
eLink Physician-Brief Progress Note Patient Name: Johnathan MaduroChristopher Brasel DOB: 1970-02-18 MRN: [id number]   Date of Service  05/14/17  HPI/Events of Note  Review of CXR --> large L pneumothorax.   eICU Interventions  Ground team notified of need for L chest tube placement.      Intervention Category Major Interventions: Hypoxemia - evaluation and management  Sommer,Steven Eugene 05/14/17, 2:27 AM

## 2017-09-07 NOTE — Progress Notes (Signed)
[facility] P[facility]M  Multiple issues:  Persistent tachypnea post chest tube placement, ABG with significant respiratory acidosis  Review of CXR post placement of 16 Fr: no resolution of pneumothorax. We were able to drain air with re-positioning of the tube but I think that it's small size makes it prone to obstruct.  Will place 28 Fr, see note.  Will also start Nimbex, versed given ongoing tachypnea, vent dyssynchrony.   CC time in addition to procedures 30 minutes  Heber CarolinaBrent McQuaid, MD [place] [facility] Pager: [phone] Cell: [phone] After 3pm or if no response, call [phone]

## 2017-09-07 NOTE — Op Note (Signed)
Chest Tube Insertion Procedure Note  Indications:  Clinically significant Pneumothorax  Pre-operative Diagnosis: Pneumothorax  Post-operative Diagnosis: Pneumothorax  Procedure Details  Informed consent was obtained for the procedure, including sedation.  Risks of lung perforation, hemorrhage, arrhythmia, and adverse drug reaction were discussed.   After sterile skin prep, using standard technique, a 16 French tube was placed in the left lateral 8th rib space.  Findings: 300 ml of purulent fluid obtained  Estimated Blood Loss:  Minimal         Specimens:  None              Complications:  None; patient tolerated the procedure well.         Disposition: ICU - intubated and critically ill.         Condition: stable  Attending Attestation: I performed the procedure.  Heber CarolinaBrent McQuaid, MD [place] [facility] Pager: [phone] Cell: [phone] After 3pm or if no response, call [phone]

## 2017-09-07 NOTE — Progress Notes (Signed)
Hypoglycemic Event  CBG: 69       Treatment: D50 IV 50 mL  Symptoms: None  Follow-up CBG: Time:1620 CBG Result:86  Possible Reasons for Event: Other: pt. actively passing   Comments/MD notified:Dr. Christene SlatesYacoub     Alejandra Hunt, Marylynn Pearsonarla J

## 2017-09-07 NOTE — Progress Notes (Signed)
Chaplain Note:  Received request for support for family of patient who was declining and not expected to survive. I met with patient's mother, aunt and grandmother and over the course of a couple of visits have provided emotional and spiritual support. The family are grieving and have had to make difficult decisions to not resuscitate.  The mother expressed how painful this is to face the death of a child. He is her first born and has an 47 year old son.   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 Her clinical knowledge has made her aware of the reality of her son's condition. She has a very supportive family.   Wells Guiles  CHaplain

## 2017-09-07 NOTE — Progress Notes (Signed)
PULMONARY / CRITICAL CARE MEDICINE   Name: Johnathan MaduroChristopher Brau MRN: [id number] DOB: 08-20-1970    ADMISSION DATE:  09/03/2017 CONSULTATION DATE:  08/18/2017  REFERRING MD:  Dr. Rush Landmarkegeler   CHIEF COMPLAINT:  Sepsis   HISTORY OF PRESENT ILLNESS:   47 year old male with PMH of MSSA Endocarditis/Bacteremia, IV Drug Abuse with Heroin/IV Opana, Hepatitis C with Cirrhosis, CKD, H/O DVT   Presents to ED on 8/23 with fever, chills, and productive cough with brown-like sputum. States he continues to use IV Heroin with most recent 3 days ago. Upon arrival to ED patient is tachycardiac with HR 150-160, Temp 99, RR 30-40, and Hypoxia with oxygen saturation 70-80. LA 7.21, CXR concerning for multifocal pneumonia. CTA negative for PE, however multiple nodular areas of irregular opacification in both lungs concerning for septic emboli. Given 3L Bolus and Cefepime/Vancomycin. Due to progressive hypoxia attempted BiPAP however patient did not tolerate due to claustrophobia. PCCM asked to consult.     SUBJECTIVE:  L apical chest tube placed o/n for PTX with 300cc output, second 28 french placed rib space 10 with air out due to no change in PTX.  Nimbex, versed drips started for vent dyssynchrony.   VITAL SIGNS: BP 91/61   Pulse (!) 130   Temp 98.7 F (37.1 C) (Oral)   Resp (!) 30   Ht 5\' 9"  (1.753 m)   Wt 76.3 kg   SpO2 90%   BMI 24.84 kg/m   VENTILATOR SETTINGS: Vent Mode: PCV FiO2 (%):  [30 %-100 %] 90 % Set Rate:  [30 bmp] 30 bmp Vt Set:  [570 mL] 570 mL PEEP:  [5 cmH20-10 cmH20] 10 cmH20 Plateau Pressure:  [18 cmH20] 18 cmH20  INTAKE / OUTPUT: I/O last 3 completed shifts: In: 7945.6 [I.V.:4752.3; NG/GT:2955; IV Piggyback:238.3] Out: 2695 [Urine:2395; Chest Tube:300]  PHYSICAL EXAMINATION:   Constitution: sedated, NAD, appears older than stated age HEENT: normcocephalic/atraumatic, ET/OG tube in place Cardio: tachycardic, regular rhythm,  Respiratory: decreased breath sounds b/l,  apical chest tube, lateral chest tube rib space 10 Abdominal: hypoactive BS, soft, nondistended MSK: no pitting edema Neuro: sedated, not responding to stimuli  Skin: c/d/i   LABS:  BMET Recent Labs  Lab 08/30/17 0342 08/31/17 0349 December 07, 2017 0340  NA 140 139 144  K 3.8 4.3 3.9  CL 104 107 114*  CO2 23 21* 22  BUN 58* 81* 78*  CREATININE 2.36* 2.50* 1.71*  GLUCOSE 71 222* 141*   Electrolytes Recent Labs  Lab 09/03/2017 2333 08/30/17 0342 08/31/17 0349 December 07, 2017 0340  CALCIUM  --  7.1* 7.0* 7.5*  MG 1.3* 1.8 2.3 2.4  PHOS 3.0  --  4.3 4.8*   CBC Recent Labs  Lab 08/30/17 0342 08/31/17 0349 December 07, 2017 0340  WBC 5.2 9.3 16.6*  HGB 11.3* 10.6* 11.3*  HCT 35.4* 32.3* 35.7*  PLT 37* 44* 39*   Coag's No results for input(s): APTT, INR in the last 168 hours.  Sepsis Markers Recent Labs  Lab 08/11/2017 2008 08/23/2017 2333 08/30/17 0341 08/30/17 0342 08/31/17 0349  LATICACIDVEN 6.42* 5.8* 3.4*  --   --   PROCALCITON  --  8.05  --  8.94 23.36   ABG Recent Labs  Lab 08/30/17 2001 08/31/17 0316 December 07, 2017 0333  PHART 7.275* 7.342* 7.145*  PCO2ART 45.9 40.5 68.2*  PO2ART 116* 138* 135.0*   Liver Enzymes Recent Labs  Lab 08/23/2017 1907 08/31/17 0349 December 07, 2017 0340  AST 48* 63* 48*  ALT 21 24 29   ALKPHOS 83 55 72  BILITOT 2.3* 2.7* 2.8*  ALBUMIN 2.3* 1.7* 1.7*   Cardiac Enzymes Recent Labs  Lab 2017/09/22 2333 08/30/17 0342 08/30/17 1524  TROPONINI 0.04* 0.04* 0.05*   Glucose Recent Labs  Lab 08/31/17 1112 08/31/17 1516 08/31/17 1955 08/31/17 2317 08/31/2017 0458 08/27/2017 0731  GLUCAP 217* 218* 204* 169* 123* 129*   Imaging Dg Chest Port 1 View  Result Date: 08/09/2017 CLINICAL DATA:  47 year old male with pneumothorax status post second chest tube placement. EXAM: PORTABLE CHEST 1 VIEW COMPARISON:  Earlier radiograph dated 09/04/2017 FINDINGS: Interval placement of a second chest tube with tip along the left upper pleura. A previously placed chest  tube with tip projecting over the left hilum. There has been near complete interval resolution of the previously seen pneumothorax with re-expansion of the left lung. Probable small amount of residual pneumothorax in the left apex. Background of emphysema and confluent airspace opacities. Stable cardiac silhouette and stable positioning of the support devices. Left chest wall soft tissue emphysema. IMPRESSION: Interval placement of a second chest tube with re-expansion of the left lung and near complete resolution of the left pneumothorax. Electronically Signed   By: Elgie Collard M.D.   On: 08/10/2017 05:30   Dg Chest Port 1 View  Result Date: 08/27/2017 CLINICAL DATA:  47 year old male with pneumothorax status post chest tube placement. EXAM: PORTABLE CHEST 1 VIEW COMPARISON:  Earlier radiograph dated 08/30/2017 FINDINGS: Moderate to large left pneumothorax appears slightly increased compared to the earlier radiograph. There has been interval placement of a left-sided chest tube with tip over the region of the left hilum. There is background of emphysema and multifocal airspace disease similar to prior radiograph. There is similar appearance of slight shift of the mediastinum to the right which may represent a degree of tension. The previously seen support devices are in similar position. There is left chest wall soft tissue emphysema. IMPRESSION: Interval placement of a left-sided chest tube with overall increase in the size of pneumothorax since the prior radiograph. Mild shift of the mediastinum to the right may represent a degree of tension. These results were called by telephone at the time of interpretation on 08/19/2017 at 4:26 am to [facility], who verbally acknowledged these results. Electronically Signed   By: Elgie Collard M.D.   On: 09/02/2017 04:32   Dg Chest Port 1 View  Result Date: 08/24/2017 CLINICAL DATA:  47 year old male with shortness of breath and intubation. EXAM: PORTABLE CHEST  1 VIEW COMPARISON:  Chest radiograph dated 08/31/2017 FINDINGS: There has been interval development of a moderate left pneumothorax measuring up to 3.7 cm from the left lateral pleural space. There is background of emphysema with confluent areas of airspace opacities. No significant midline shift. Support tubes as seen previously. IMPRESSION: 1. Interval development of a moderate left pneumothorax. 2. Emphysema with confluent airspace opacities. 3. Stable positioning of the support devices. These results were called by telephone at the time of interpretation on 08/07/2017 at 2:27 am to nurse Madilyn Fireman, who verbally acknowledged these results. Electronically Signed   By: Elgie Collard M.D.   On: 08/17/2017 02:39   STUDIES:  CXR 8/23 > Hyperinflation with marked bullous emphysematous disease. Multifocal airspace disease in left greater than right may reflect pneumonia. Left lower lung focal airspace disease is somewhat nodular in configuration, radiographic follow-up to resolution recommended to exclude pulmonary mass lesion CTA Chest 8/23 > No evidence pulmonary embolism. Severe emphysematous changes in the upper lobes. Confluent areas of infiltrate are seen in the upper lobes  bilaterally consistent with multifocal pneumonia. However, multiple additional nodular areas of irregular opacification are seen in both lungs, some of which contain central cavitation, highly suspicious for septic emboli. 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 Recommend annual imaging followup by CTA or MRA.  CXR 8/24: Interval placement of a second chest tube with re-expansion of the left lung and near complete resolution of the left pneumothorax.  . sodium chloride 10 mL/hr at 08/22/2017 0600  . cisatracurium (NIMBEX) infusion 3 mcg/kg/min (08/15/2017 0600)  . dexmedetomidine (PRECEDEX) IV infusion 0.5 mcg/kg/hr (08/24/2017 0702)  . feeding supplement (VITAL AF 1.2 CAL) 75 mL/hr at  08/22/2017 0600  . fentaNYL infusion INTRAVENOUS 300 mcg/hr (08/16/2017 0741)  . midazolam (VERSED) infusion 2 mg/hr (08/22/2017 0600)  . norepinephrine (LEVOPHED) Adult infusion Stopped (08/16/2017 0151)  . phenylephrine (NEO-SYNEPHRINE) Adult infusion Stopped (08/31/17 0303)  .  sodium bicarbonate  infusion 1000 mL 100 mL/hr at 08/26/2017 0600  . vancomycin Stopped (08/31/17 1814)  . vasopressin (PITRESSIN) infusion - *FOR SHOCK* Stopped (08/31/17 1849)    CULTURES: Blood 8/23 > +MRSA Sputum 8/23 >  U/A 8/23 > no growth Blood 8/24 >   ANTIBIOTICS: Vancomycin 8/23>> Cefepime 8/23 >> 8/24  SIGNIFICANT EVENTS: 8/23 > Presents to ED  LINES/TUBES: PIV   DISCUSSION: 47 year old male with PMH of MRSA Endocarditis presents to ED with tachycardia and hypoxia. Positive MRSA blood cultures 8/24. Found on CT to have apical emphysematous disease and central cavitary lesions significant for likely septic emobli.   ASSESSMENT / PLAN:  Acute Hypoxic Respiratory Failure in setting of central cavitation in setting of septic emboli with right upper and left upper opacity Severe Emphysematous Disease  H/O RLL Cavitary Pneumonia in setting of Endocarditis  Intubated 8/24. PC RR 30 Vt 570 FiO2 90% PEEP 20/10. Fent 300, nimbex , versed 2mg /hr pressors held. Positive alpha-antitrypson. Two L chest tubes placed o/n, cxr shows resolution of PTX. Lower lateral CT w/connector leak.   Stop precedex, bicarb drip Fent/versed added for vent dyssyncrhony Cont. PC ventilation  Titrate O2 for sat of 88-92% Trend ABG/CXR Antibiotics as below  Duboneb/Pulmiort/Brovana Positive alpha-antitrypsin   Tachycardia in setting of septic shock  MRSA Endocarditis with septic emboli  Grade 1 Diastolic Dysfunction (EF 55-60)  H/O MRSA/Pseudomonas bacteremia/Tricuspid Valve Endocarditis  Echo did not show endocarditis but likely considering patient's history.   Metoprolol  TEE today  Continue vancomycin Trend WBC  and monitor for fever Trend PCT    Anion Gap Metabolic Acidosis with Lactic Acidosis  LA 7.21 > 6.42 >> 3.4 Acute Renal Injury  Cr. 2.5 >> 1.17  UCr: 128, Una: 19  Diurese - lasix, metazolone  Trend BMP Replace electrolytes as indicated   Hepatitis C with Cirrhosis  TF  Thrombocytopenia, anemia with H/O LUE Radial Vein DVT 7/24: Stable 34 >> 39 - HIT risk <5% Likely secondary to endocarditis.  Trend CBC  Maintain Hbg >7  Transfuse for platelets <20  Hypoglycemia -    Trend Glucose q4h Start TF   Substance Abuse > IV Opana, Heroin  CIWA protocol RASS Goal 0/-1 Wean Precedex to Achieve RASS   VTE: heparin, SCDs  Diet: NPO  IVF: none  FAMILY  - Updates: No family bedside.   - Inter-disciplinary family meet or Palliative Care meeting due by: 09/05/2017    Versie Starks, DO 08/25/2017, 8:25 AM Pager: [phone]

## 2017-09-07 NOTE — Progress Notes (Signed)
Had a conversation with the mother over the phone then she arrived to the ICU.  I informed her of the patient's condition and that his chances of survival are essentially zero at this point.  Initially she was insistent on full code status.  She informed me that the patient's father wants him as full code.  I informed her that CPR under current conditions would simply be ineffective as the acidosis is refractory to treatment and that shock state is not reversible.  After discussion she agreed that DNR would be the appropriate course of action here.  Patient is already maxed out of multiple pressors.  At this point, no further escalation of care.  DNR status will be entered.  The patient is critically ill with multiple organ systems failure and requires high complexity decision making for assessment and support, frequent evaluation and titration of therapies, application of advanced monitoring technologies and extensive interpretation of multiple databases.   Critical Care Time devoted to patient care services described in this note is  45  Minutes. This time reflects time of care of this signee Dr Koren BoundWesam Yacoub. This critical care time does not reflect procedure time, or teaching time or supervisory time of PA/NP/Med student/Med Resident etc but could involve care discussion time.  Alyson ReedyWesam G. Yacoub, M.D. [facility]. Pager: [phone]. After hours pager: [phone].

## 2017-09-07 NOTE — Progress Notes (Signed)
Patient Death Note:  Pt. Expired at 1758, heart sounds listened to by Cinda QuestHaleigh, RN and Albin Fellingarla, RN, none noted. Pt. Does not qualify for organ donation nor ME.  Will waste Fentanyl/Versed gtts with Cinda QuestHaleigh, Charity fundraiserN. Emotional support provided to family. Will obtain information necessary for death checklist.

## 2017-09-07 NOTE — Progress Notes (Signed)
Fentanyl 175 cc and Versed 10 cc wasted in sink by Cinda QuestHaleigh, RN and Albin Fellingarla, RN

## 2017-09-07 DEATH — deceased

## 2017-09-09 ENCOUNTER — Telehealth: Payer: Self-pay

## 2017-09-09 NOTE — Telephone Encounter (Signed)
On 09/09/17 I received a d/c from [facility] (original). The d/c is for cremation.  The patient is a patient of Doctor McQuaid.   The d/c will be taken to Pulmonary Unit for signature.  On 09/18/17 I received the d/c back from Doctor McQuaid.  I got the d/c ready and called the funeral home to let them know the d/c was mailed to vital records per the funeral home request.

## 2017-10-07 NOTE — Death Summary Note (Signed)
DEATH SUMMARY   Patient Details  Name: Johnathan [name] MRN: [id number] DOB: 19-Oct-1970  Admission/Discharge Information   Admit Date:  2017/09/06  Date of Death: Date of Death: 09-Sep-2017  Time of Death: Time of Death: 1758  Length of Stay: 3  Referring Physician: Karle Plumber, MD   Reason(s) for Hospitalization    Diagnoses  Preliminary cause of death:   Acute Bacterial Endocarditis Secondary Diagnoses (including complications and co-morbidities):  Principal Problem:   MRSA bacteremia Active Problems:   Chronic hepatitis C (HCC)   Substance abuse (HCC)   Pneumonia   Pneumothorax   Septic shock (HCC)   Chronic viral hepatitis B without delta-agent (HCC)   Refractory shock (HCC)   Metabolic acidemia   Goals of care, counseling/discussion   Brief Hospital Course (including significant findings, care, treatment, and services provided and events leading to death)  47 year old male with PMH of MSSA Endocarditis/Bacteremia, IV Drug Abuse with Heroin/IV Opana, Hepatitis C with Cirrhosis, CKD, H/O DVT   Presents to ED on 2022-09-07 with fever, chills, and productive cough with brown-like sputum. States he continues to use IV Heroin with most recent 3 days ago. Upon arrival to ED patient is tachycardiac with HR 150-160, Temp 99, RR 30-40, and Hypoxia with oxygen saturation 70-80. LA 7.21, CXR concerning for multifocal pneumonia. CTA negative for PE, however multiple nodular areas of irregular opacification in both lungs concerning for septic emboli. Given 3L Bolus and Cefepime/Vancomycin. Due to progressive hypoxia attempted BiPAP however patient did not tolerate due to claustrophobia. PCCM asked to consult.    Had a conversation with the mother over the phone then she arrived to the ICU.  I informed her of the patient's condition and that his chances of survival are essentially zero at this point.  Initially she was insistent on full code status.  She informed me that the patient's  father wants him as full code.  I informed her that CPR under current conditions would simply be ineffective as the acidosis is refractory to treatment and that shock state is not reversible.  After discussion she agreed that DNR would be the appropriate course of action here.  Patient is already maxed out of multiple pressors.  At this point, no further escalation of care.  DNR status will be entered.  Pertinent Labs and Studies  Significant Diagnostic Studies Ct Angio Chest Pe W And/or Wo Contrast  Result Date: 2017/09/06 CLINICAL DATA:  Diffuse pleuritic chest pain, shortness of breath, tachycardia, hypoxia, tachypnea, sudden onset yesterday, high clinical pretest probability for pulmonary embolism, history smoking, IV substance abuse EXAM: CT ANGIOGRAPHY CHEST WITH CONTRAST TECHNIQUE: Multidetector CT imaging of the chest was performed using the standard protocol during bolus administration of intravenous contrast. Multiplanar CT image reconstructions and MIPs were obtained to evaluate the vascular anatomy. 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 No aortic dissection. Heart unremarkable. No pericardial effusion. Pulmonary arteries well opacified and patent. No evidence of pulmonary embolism. Mediastinum/Nodes: Retained fluid in distal esophagus. Base of cervical region normal appearance. No thoracic adenopathy. Lungs/Pleura: Dependent atelectasis in the posterior lower lobes bilaterally. Severe emphysematous changes in the upper lobes. Multiple patchy foci of irregular nodular opacification are seen in both lungs, some which demonstrate central cavitation, suspicious for septic emboli. Additional areas of more confluent opacity are seen within the RIGHT upper lobe and LEFT upper lobe consistent with multifocal pneumonia as well. Unable  to exclude underlying pulmonary  nodules/tumor. No pleural effusion or pneumothorax. Upper Abdomen: Significant splenomegaly. Normal sized lymph nodes at gastrohepatic ligament. Remaining visualized upper abdomen unremarkable. Musculoskeletal: No acute osseous findings. Review of the MIP images confirms the above findings. IMPRESSION: No evidence pulmonary embolism. Severe emphysematous changes in the upper lobes. Confluent areas of infiltrate are seen in the upper lobes bilaterally consistent with multifocal pneumonia. However, multiple additional nodular areas of irregular opacification are seen in both lungs, some of which contain central cavitation, highly suspicious for septic emboli. 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 Recommend annual imaging followup by CTA or MRA. This recommendation follows 2010 ACCF/AHA/AATS/ACR/ASA/SCA/SCAI/SIR/STS/SVM Guidelines for the Diagnosis and Management of Patients with Thoracic Aortic Disease. Circulation. 2010; 121: O962-X528 Findings called to Dr. Rush Landmark on 2017/09/03 at 1855 hrs. Aortic Atherosclerosis (ICD10-I70.0). Aortic aneurysm NOS (ICD10-I71.9). Emphysema (ICD10-J43.9). Electronically Signed   By: Ulyses Southward M.D.   On: 09-03-17 18:56   Dg Chest Port 1 View  Result Date: 08/26/2017 CLINICAL DATA:  47 year old male with pneumothorax status post second chest tube placement. EXAM: PORTABLE CHEST 1 VIEW COMPARISON:  Earlier radiograph dated 08/20/2017 FINDINGS: Interval placement of a second chest tube with tip along the left upper pleura. A previously placed chest tube with tip projecting over the left hilum. There has been near complete interval resolution of the previously seen pneumothorax with re-expansion of the left lung. Probable small amount of residual pneumothorax in the left apex. Background of emphysema and confluent airspace opacities. Stable cardiac silhouette and stable positioning of the support devices. Left chest wall soft tissue  emphysema. IMPRESSION: Interval placement of a second chest tube with re-expansion of the left lung and near complete resolution of the left pneumothorax. Electronically Signed   By: Elgie Collard M.D.   On: 08/14/2017 05:30   Dg Chest Port 1 View  Result Date: 08/18/2017 CLINICAL DATA:  47 year old male with pneumothorax status post chest tube placement. EXAM: PORTABLE CHEST 1 VIEW COMPARISON:  Earlier radiograph dated 08/23/2017 FINDINGS: Moderate to large left pneumothorax appears slightly increased compared to the earlier radiograph. There has been interval placement of a left-sided chest tube with tip over the region of the left hilum. There is background of emphysema and multifocal airspace disease similar to prior radiograph. There is similar appearance of slight shift of the mediastinum to the right which may represent a degree of tension. The previously seen support devices are in similar position. There is left chest wall soft tissue emphysema. IMPRESSION: Interval placement of a left-sided chest tube with overall increase in the size of pneumothorax since the prior radiograph. Mild shift of the mediastinum to the right may represent a degree of tension. These results were called by telephone at the time of interpretation on 09/06/2017 at 4:26 am to [facility], who verbally acknowledged these results. Electronically Signed   By: Elgie Collard M.D.   On: 09/02/2017 04:32   Dg Chest Port 1 View  Result Date: 08/10/2017 CLINICAL DATA:  47 year old male with shortness of breath and intubation. EXAM: PORTABLE CHEST 1 VIEW COMPARISON:  Chest radiograph dated 08/31/2017 FINDINGS: There has been interval development of a moderate left pneumothorax measuring up to 3.7 cm from the left lateral pleural space. There is background of emphysema with confluent areas of airspace opacities. No significant midline shift. Support tubes as seen previously. IMPRESSION: 1. Interval development of a moderate left  pneumothorax. 2. Emphysema with confluent airspace opacities. 3. Stable positioning of the support devices.  These results were called by telephone at the time of interpretation on 08/17/2017 at 2:27 am to nurse Madilyn Fireman, who verbally acknowledged these results. Electronically Signed   By: Elgie Collard M.D.   On: 09/02/2017 02:39   Dg Chest Port 1 View  Result Date: 08/31/2017 CLINICAL DATA:  Intubated EXAM: PORTABLE CHEST 1 VIEW COMPARISON:  Chest radiograph from one day prior. FINDINGS: Endotracheal tube tip is 2.4 cm above the carina. Enteric tube enters stomach with the tip not seen on this image. Right internal jugular central venous catheter terminates in the middle third of the SVC. Stable cardiomediastinal silhouette with normal heart size. No pneumothorax. No pleural effusion. Extensive patchy consolidation throughout both lungs, not appreciably changed. IMPRESSION: 1. Well-positioned support structures. 2. Stable extensive patchy consolidation throughout both lungs compatible with multilobar pneumonia. Electronically Signed   By: Delbert Phenix M.D.   On: 08/31/2017 08:04   Dg Chest Port 1 View  Result Date: 08/30/2017 CLINICAL DATA:  Central line placement. EXAM: PORTABLE CHEST 1 VIEW COMPARISON:  Radiograph less than 1 hour prior. FINDINGS: Tip of the right central line in the mid SVC. No pneumothorax. Endotracheal tube tip remains at the thoracic inlet. Enteric tube in place with tip below the diaphragm not included in the field of view. Unchanged heart size and mediastinal contours. Unchanged diffuse bilateral airspace opacities and possible pleural effusions. Emphysema again seen. IMPRESSION: 1. Tip of the right internal jugular central venous catheter in the mid SVC. No pneumothorax. 2. Unchanged multifocal bilateral airspace disease and possible pleural effusions. Electronically Signed   By: Rubye Oaks M.D.   On: 08/30/2017 02:29   Dg Chest Port 1 View  Result Date:  08/30/2017 CLINICAL DATA:  Acute respiratory failure. Endotracheal and enteric tube placement. EXAM: PORTABLE CHEST 1 VIEW COMPARISON:  Radiographs and CT yesterday. FINDINGS: Endotracheal tube tip at the thoracic inlet 4.8 cm from the carina. Enteric tube in place with tip below the diaphragm not included in the field of view. Side-port not well visualized and may be in the stomach. Progressive bilateral airspace disease, particular progression at the right lung base with possible developing pleural effusions. Advanced apical emphysema again seen. No pneumothorax. IMPRESSION: 1. Endotracheal tube tip at the thoracic inlet. Tip of the enteric tube below the diaphragm, not included in the field of view. 2. Progressive bilateral airspace disease over the past 5 hours. This may represent progression in multifocal pneumonia and septic emboli versus an element of superimposed ARDS. Electronically Signed   By: Rubye Oaks M.D.   On: 08/30/2017 01:55   Dg Chest Portable 1 View  Result Date: 2017-09-03 CLINICAL DATA:  Chest pain EXAM: PORTABLE CHEST 1 VIEW COMPARISON:  09/28/2016, 09/27/2016, 09/26/2016, CT chest 09/22/2016 FINDINGS: Hyperinflation with emphysematous disease and bullous change in the apices. Increased interstitial opacity at the bases. Multifocal opacities within the bilateral lungs. Somewhat nodular configuration in the left lower lung. Stable cardiomediastinal silhouette. No pneumothorax. IMPRESSION: Hyperinflation with marked bullous emphysematous disease. Multifocal airspace disease in left greater than right may reflect pneumonia. Left lower lung focal airspace disease is somewhat nodular in configuration, radiographic follow-up to resolution recommended to exclude pulmonary mass lesion. Electronically Signed   By: Jasmine Pang M.D.   On: 09-03-2017 18:02    Microbiology No results found for this or any previous visit (from the past 240 hour(s)).  Lab Basic Metabolic Panel: No results  for input(s): NA, K, CL, CO2, GLUCOSE, BUN, CREATININE, CALCIUM, MG, PHOS in the last 168  hours. Liver Function Tests: No results for input(s): AST, ALT, ALKPHOS, BILITOT, PROT, ALBUMIN in the last 168 hours. No results for input(s): LIPASE, AMYLASE in the last 168 hours. No results for input(s): AMMONIA in the last 168 hours. CBC: No results for input(s): WBC, NEUTROABS, HGB, HCT, MCV, PLT in the last 168 hours. Cardiac Enzymes: No results for input(s): CKTOTAL, CKMB, CKMBINDEX, TROPONINI in the last 168 hours. Sepsis Labs: No results for input(s): PROCALCITON, WBC, LATICACIDVEN in the last 168 hours.  Procedures/Operations     Tareva Leske 09/17/2017, 5:01 PM

## 2019-01-24 IMAGING — DX DG CHEST 1V PORT
1 series · 2 of 2 positions shown · non-contrast
Comparison: Yesterday

CLINICAL DATA: Acute respiratory failure

EXAM:
PORTABLE CHEST 1 VIEW

[Series 1: chest ap · 0.14mm/px · 2 of 2 slices shown]
[im 1/2]
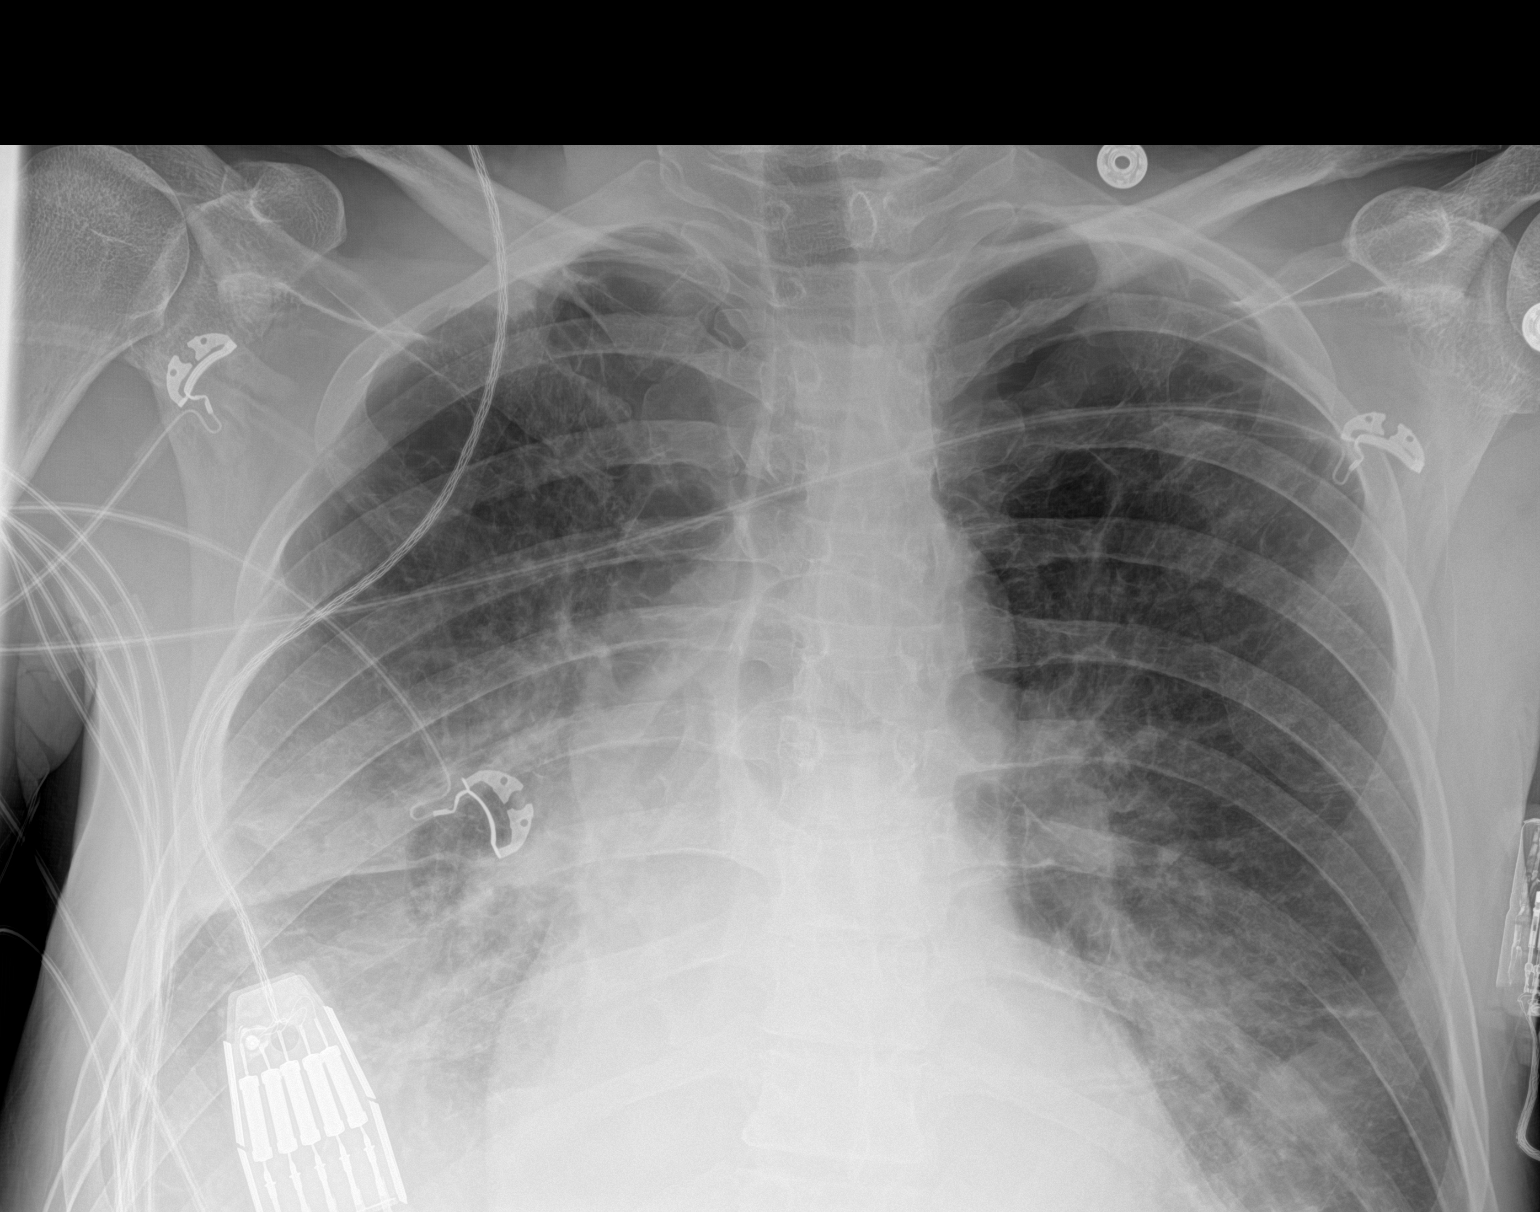
[im 2/2]
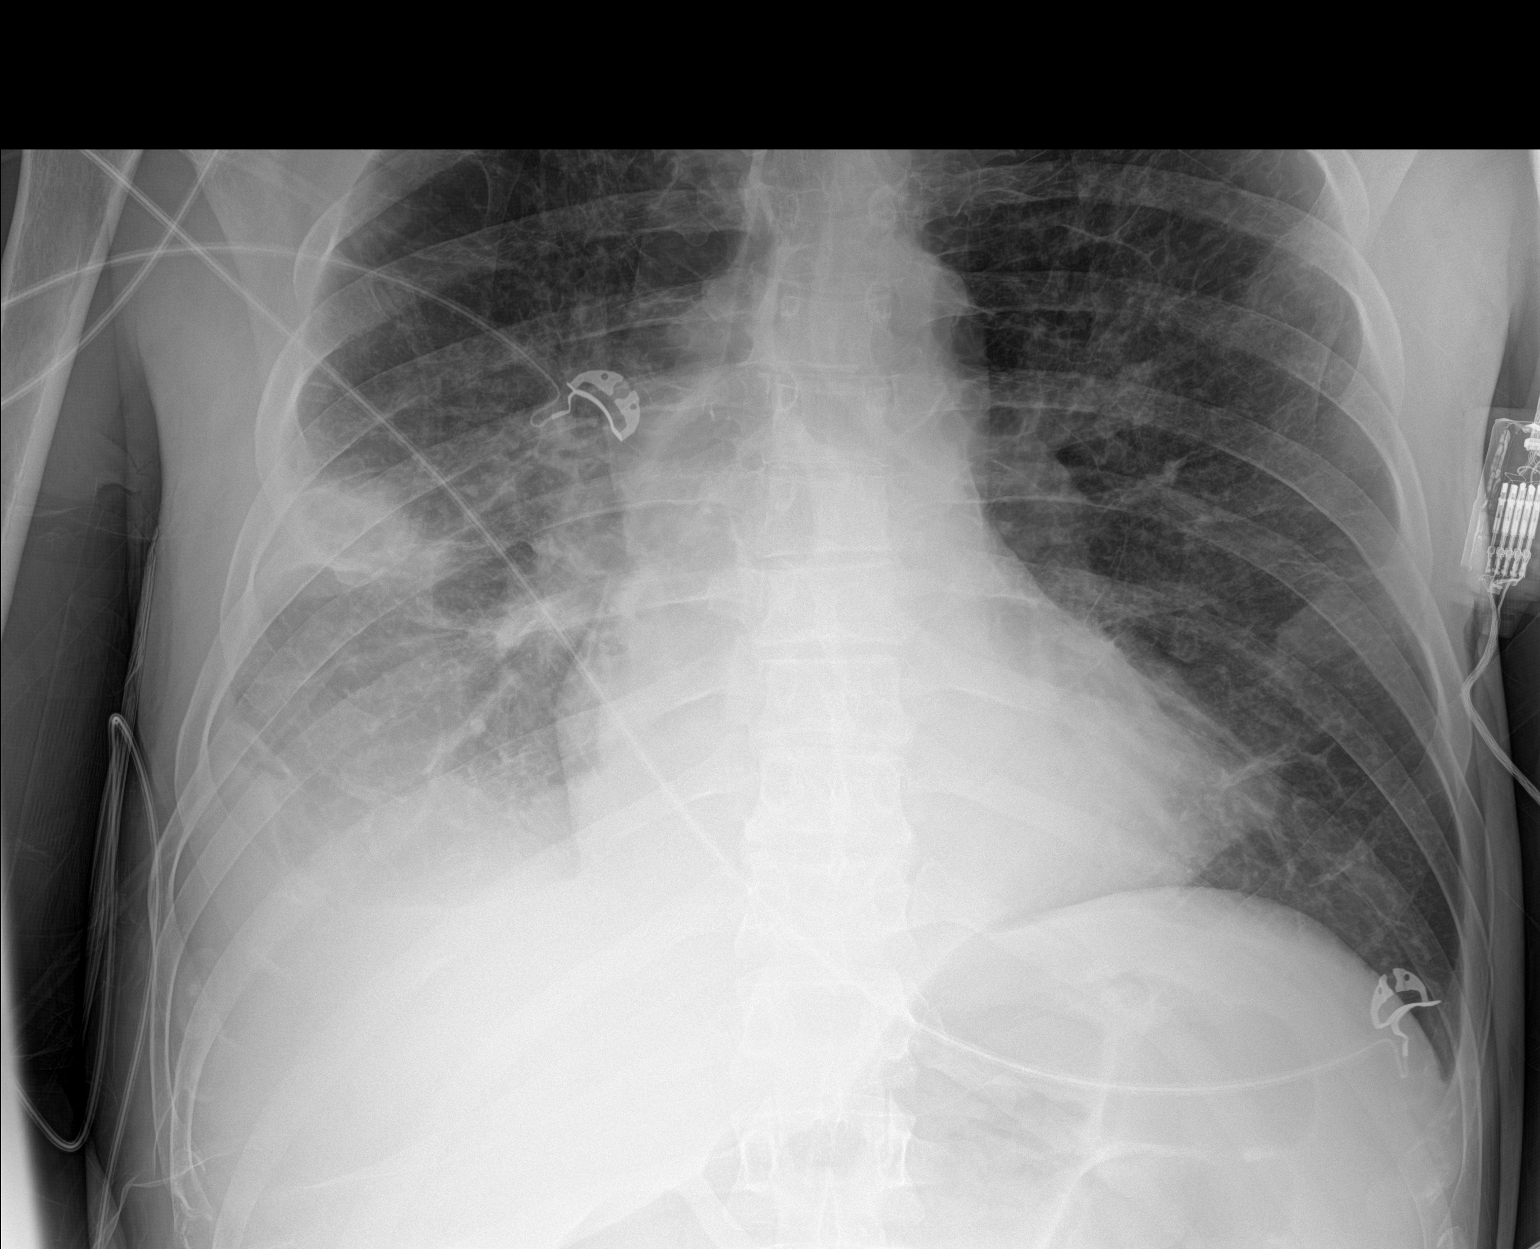

[2 of 2 positions shown; findings below may reference images not displayed]

FINDINGS: Opacity with central lucency in the peripheral right chest. There is
a small to moderate right pleural effusion. No pneumothorax.

Emphysema.

Cardiomegaly.  Stable aortic contours.
IMPRESSION: 1. Stable from yesterday.
2. Probable pneumonia with concern for cavitation on the right.
3. Small to moderate right pleural effusion.
4.  Emphysema ([id number]).

## 2019-01-27 IMAGING — DX DG CHEST 1V PORT
1 series · 1 of 1 positions shown · non-contrast
Comparison: 07/27/2016 and 07/25/2016

CLINICAL DATA: Pleural effusion. Pseudomonas bacteremia with acute
tricuspid valve endocarditis.

EXAM:
PORTABLE CHEST 1 VIEW

[chest ap]
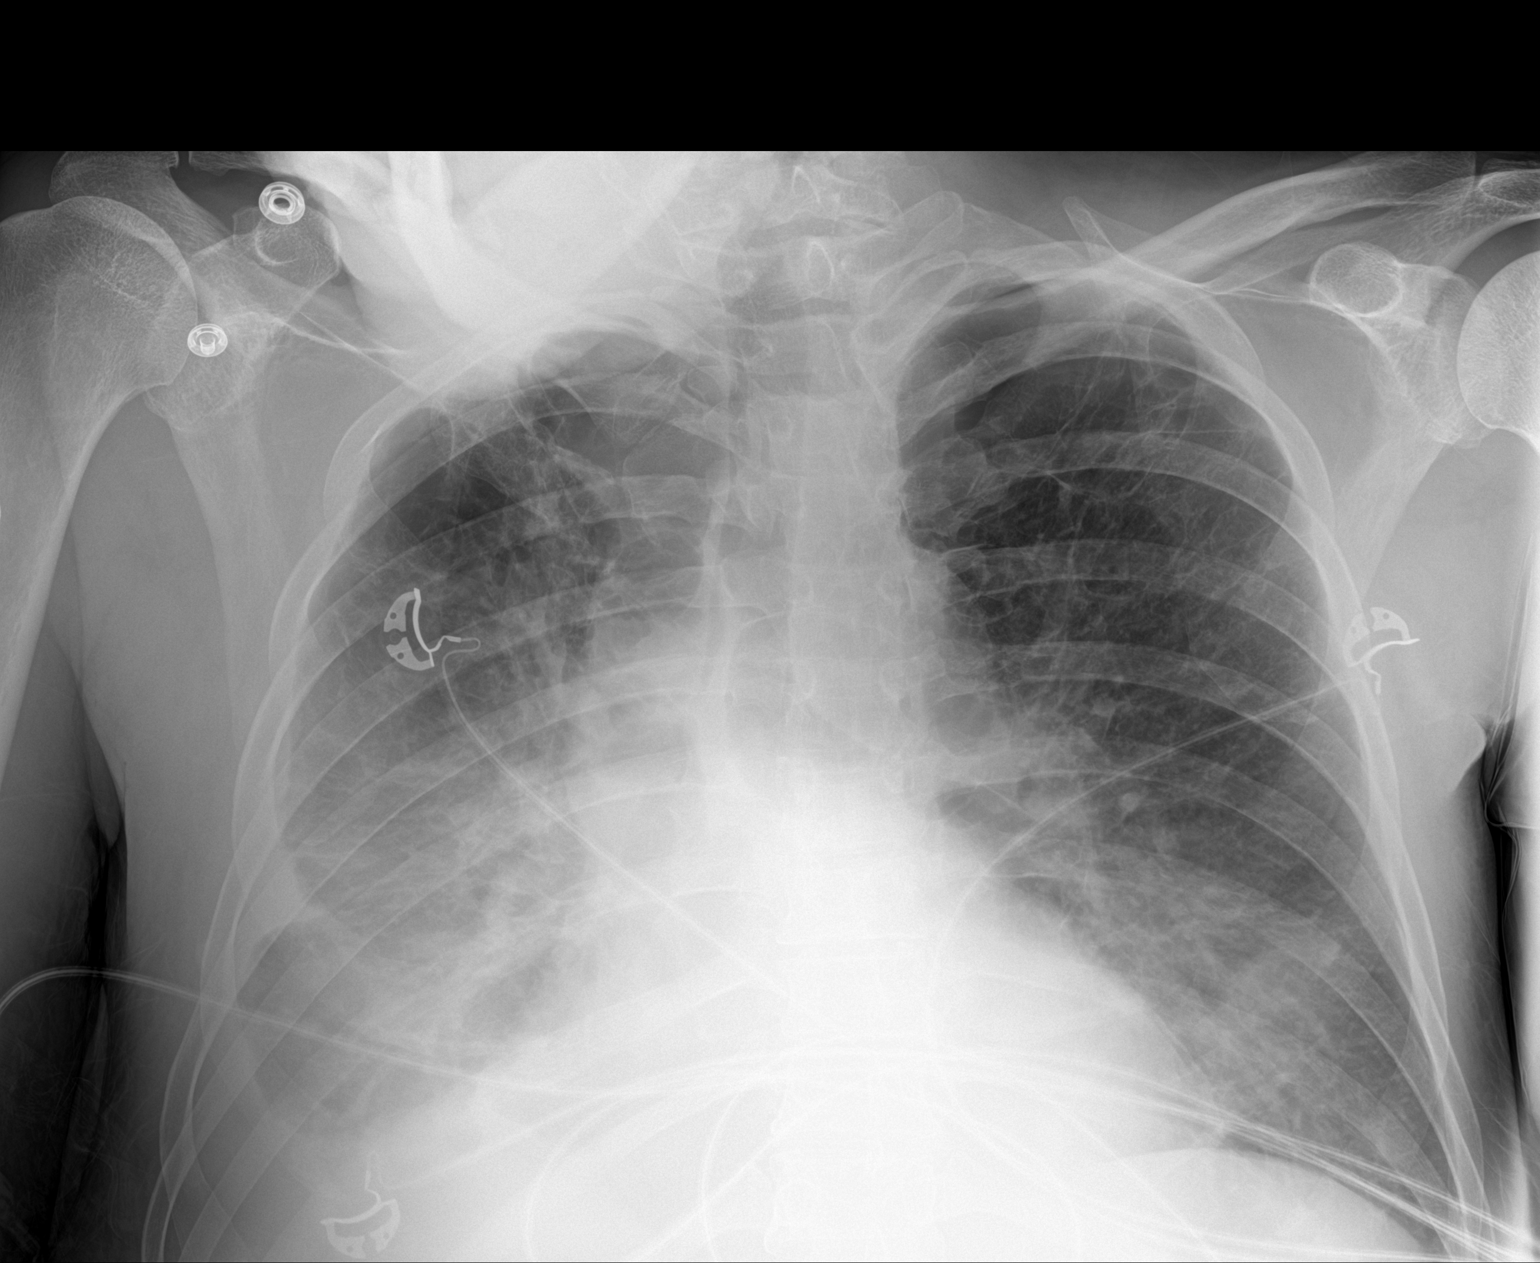

[1 of 1 positions shown; findings below may reference images not displayed]

FINDINGS: Patient slightly rotated to the right. Lungs are adequately inflated
demonstrate bilateral perihilar bibasilar opacification with slight
interval worsening within the left base. Stable small to moderate
right pleural effusion. Stable mild cardiomegaly.
IMPRESSION: Persistent bilateral perihilar bibasilar opacification with slight
interval worsening in the left base. Findings may be due to edema
versus infection. Stable small to moderate right pleural effusion.

Cardiomegaly.

## 2019-01-28 IMAGING — DX DG ANKLE 2V *L*
2 series · 2 of 2 positions shown · non-contrast
Comparison: None.

CLINICAL DATA: Blunt trauma while transferring patient to bed

EXAM:
LEFT ANKLE - 2 VIEW

[ankle ap]
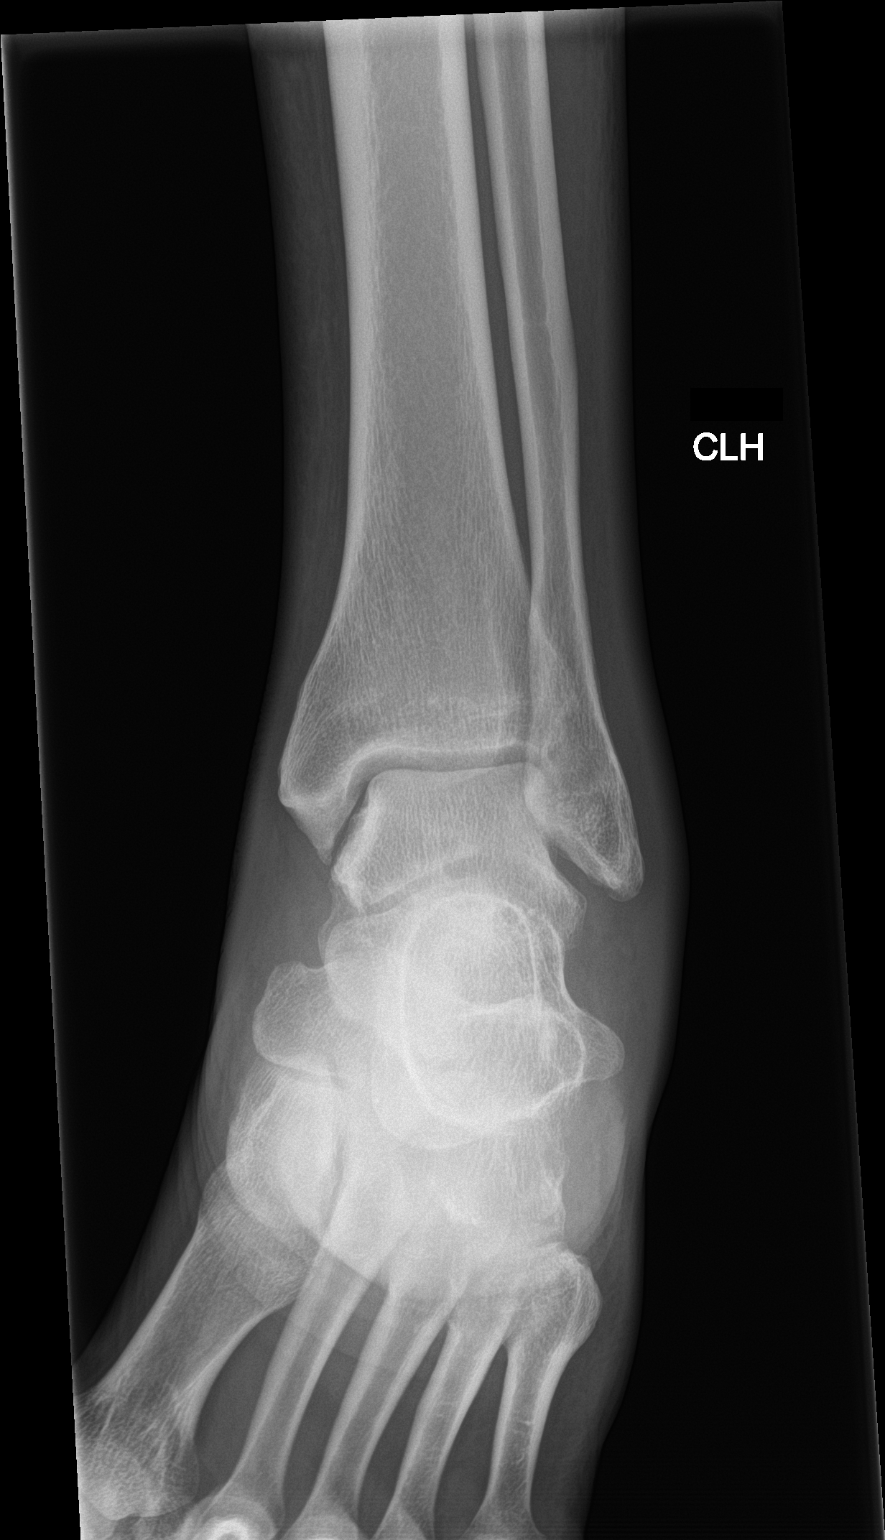

[ankle lat]
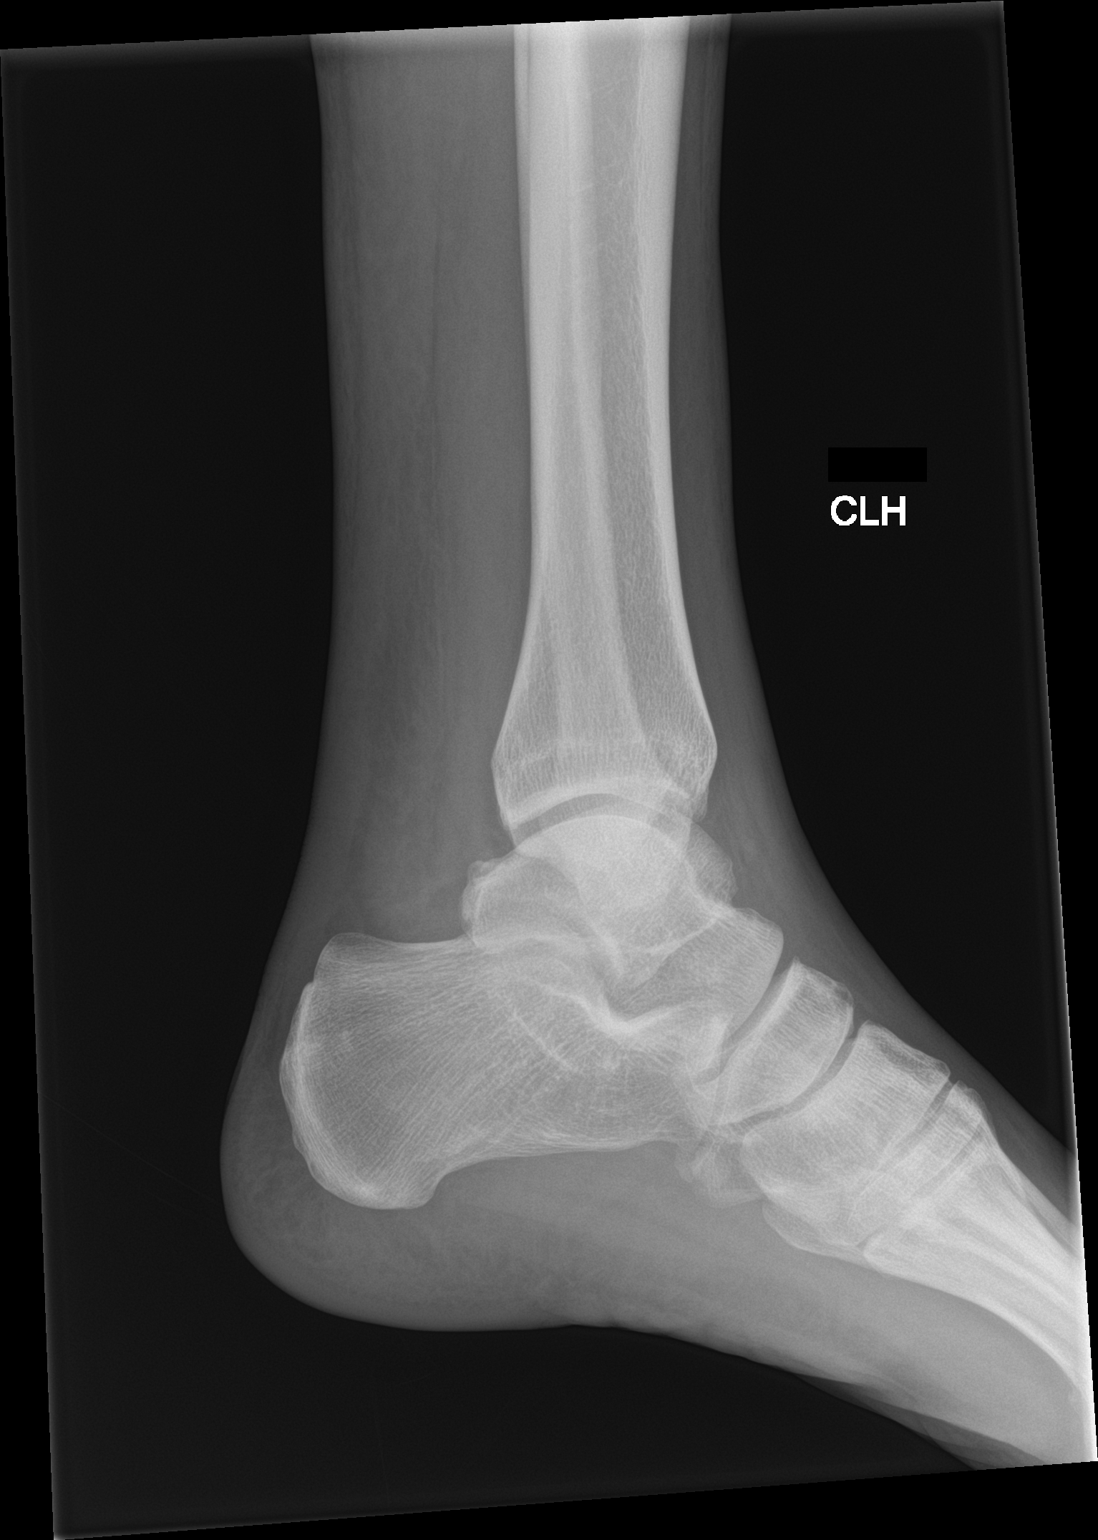

[2 of 2 positions shown; findings below may reference images not displayed]

FINDINGS: Mild soft tissue swelling is noted laterally. No acute fracture or
dislocation is seen. No other focal abnormality is noted.
IMPRESSION: Mild soft tissue swelling without acute bony abnormality.
